# Patient Record
Sex: Female | Born: 1935 | Race: Black or African American | Hispanic: No | Smoking: Former smoker
Health system: Southern US, Community
[De-identification: ages and names within clinical notes are randomized; demographics above are authoritative.]

## PROBLEM LIST (undated history)

## (undated) DIAGNOSIS — R112 Nausea with vomiting, unspecified: Secondary | ICD-10-CM

## (undated) DIAGNOSIS — C3490 Malignant neoplasm of unspecified part of unspecified bronchus or lung: Secondary | ICD-10-CM

## (undated) DIAGNOSIS — I10 Essential (primary) hypertension: Secondary | ICD-10-CM

## (undated) DIAGNOSIS — E079 Disorder of thyroid, unspecified: Secondary | ICD-10-CM

## (undated) DIAGNOSIS — E7800 Pure hypercholesterolemia, unspecified: Secondary | ICD-10-CM

## (undated) DIAGNOSIS — T451X5A Adverse effect of antineoplastic and immunosuppressive drugs, initial encounter: Secondary | ICD-10-CM

## (undated) DIAGNOSIS — C159 Malignant neoplasm of esophagus, unspecified: Secondary | ICD-10-CM

## (undated) DIAGNOSIS — M810 Age-related osteoporosis without current pathological fracture: Secondary | ICD-10-CM

## (undated) DIAGNOSIS — B019 Varicella without complication: Secondary | ICD-10-CM

## (undated) HISTORY — DX: Nausea with vomiting, unspecified: R11.2

## (undated) HISTORY — DX: Malignant neoplasm of esophagus, unspecified: C15.9

## (undated) HISTORY — PX: ABDOMINAL HYSTERECTOMY: SHX81

## (undated) HISTORY — DX: Pure hypercholesterolemia, unspecified: E78.00

## (undated) HISTORY — DX: Essential (primary) hypertension: I10

## (undated) HISTORY — DX: Disorder of thyroid, unspecified: E07.9

## (undated) HISTORY — DX: Varicella without complication: B01.9

## (undated) HISTORY — DX: Malignant neoplasm of unspecified part of unspecified bronchus or lung: C34.90

## (undated) HISTORY — DX: Hypercalcemia: E83.52

## (undated) HISTORY — DX: Age-related osteoporosis without current pathological fracture: M81.0

## (undated) HISTORY — PX: CHOLECYSTECTOMY: SHX55

## (undated) HISTORY — DX: Adverse effect of antineoplastic and immunosuppressive drugs, initial encounter: T45.1X5A

---

## 2004-05-04 ENCOUNTER — Ambulatory Visit: Payer: Self-pay | Admitting: Internal Medicine

## 2004-07-14 ENCOUNTER — Ambulatory Visit: Payer: Self-pay | Admitting: Internal Medicine

## 2005-02-13 ENCOUNTER — Emergency Department: Payer: Self-pay | Admitting: Emergency Medicine

## 2005-04-12 ENCOUNTER — Other Ambulatory Visit: Payer: Self-pay

## 2005-04-18 ENCOUNTER — Inpatient Hospital Stay: Payer: Self-pay | Admitting: Obstetrics and Gynecology

## 2005-04-18 HISTORY — PX: OTHER SURGICAL HISTORY: SHX169

## 2005-07-18 ENCOUNTER — Ambulatory Visit: Payer: Self-pay | Admitting: Internal Medicine

## 2006-07-18 ENCOUNTER — Ambulatory Visit: Payer: Self-pay | Admitting: Internal Medicine

## 2007-01-19 ENCOUNTER — Ambulatory Visit: Payer: Self-pay | Admitting: Internal Medicine

## 2007-03-07 ENCOUNTER — Emergency Department: Payer: Self-pay | Admitting: Emergency Medicine

## 2007-03-07 ENCOUNTER — Other Ambulatory Visit: Payer: Self-pay

## 2007-07-23 ENCOUNTER — Ambulatory Visit: Payer: Self-pay | Admitting: Internal Medicine

## 2008-01-25 ENCOUNTER — Emergency Department: Payer: Self-pay | Admitting: Emergency Medicine

## 2008-07-24 ENCOUNTER — Ambulatory Visit: Payer: Self-pay | Admitting: Internal Medicine

## 2009-07-28 ENCOUNTER — Ambulatory Visit: Payer: Self-pay | Admitting: Internal Medicine

## 2009-08-04 ENCOUNTER — Ambulatory Visit: Payer: Self-pay | Admitting: Internal Medicine

## 2009-08-31 ENCOUNTER — Ambulatory Visit: Payer: Self-pay | Admitting: Surgery

## 2010-02-03 ENCOUNTER — Ambulatory Visit: Payer: Self-pay | Admitting: Surgery

## 2010-08-17 ENCOUNTER — Ambulatory Visit: Payer: Self-pay | Admitting: Internal Medicine

## 2011-07-12 ENCOUNTER — Other Ambulatory Visit: Payer: Self-pay | Admitting: Endocrinology

## 2011-07-12 DIAGNOSIS — E049 Nontoxic goiter, unspecified: Secondary | ICD-10-CM

## 2011-07-19 ENCOUNTER — Ambulatory Visit
Admission: RE | Admit: 2011-07-19 | Discharge: 2011-07-19 | Disposition: A | Discharge status: Home / Self Care | Payer: Medicare Other | Source: Ambulatory Visit | Attending: Endocrinology | Admitting: Endocrinology

## 2011-07-19 DIAGNOSIS — E049 Nontoxic goiter, unspecified: Secondary | ICD-10-CM

## 2011-10-14 ENCOUNTER — Ambulatory Visit: Payer: Self-pay | Admitting: Internal Medicine

## 2011-10-14 LAB — HM MAMMOGRAPHY

## 2012-03-15 ENCOUNTER — Telehealth: Payer: Self-pay | Admitting: Internal Medicine

## 2012-03-15 MED ORDER — PANTOPRAZOLE SODIUM 40 MG PO TBEC: 40.0000 mg | DELAYED_RELEASE_TABLET | Freq: Every day | ORAL | Status: DC

## 2012-03-15 MED ORDER — AMLODIPINE BESYLATE 10 MG PO TABS: 10.0000 mg | ORAL_TABLET | Freq: Every day | ORAL | Status: DC

## 2012-03-15 NOTE — Telephone Encounter (Signed)
Pt came in today needing refills on the follow meds. Made appointment for 1/14  [facility]  Rx# [id number] 30 pantoprazole sodium 40mg  ter  Generic for protonix 40mg  ter Take 1 table by mouth each morning  Take 30 min for breakfast  AV#[id number] 30 amlodipine besylate 10mg  tab Take 1 tablet by mouth every morning  For blood pressure  Pt had sample bottle that dr Evlyn Kanner her endocrine  20mg /12.5mg  benicar hct tablets Pt stated she take once daily Pt only has 3 tablets left of this

## 2012-03-15 NOTE — Telephone Encounter (Signed)
Ok to refill her amlodipine and her generic protonix x 4.  Regarding the med Dr Evlyn Kanner started - notify her to have him refill this until i can see her or get her information from Dr Evlyn Kanner. She will need to sign a release - so i can get her info from Dr Evlyn Kanner.  Also if needed, i can work her in tomorrow to follow up regarding her blood pressure - and can refill her med then.

## 2012-03-15 NOTE — Telephone Encounter (Signed)
RX's sent and left a message for pt to return call

## 2012-06-26 ENCOUNTER — Encounter: Payer: Self-pay | Admitting: Internal Medicine

## 2012-06-26 ENCOUNTER — Ambulatory Visit (INDEPENDENT_AMBULATORY_CARE_PROVIDER_SITE_OTHER): Payer: Medicare Other | Admitting: Internal Medicine

## 2012-06-26 VITALS — BP 122/70 | HR 76 | Temp 97.9°F | Ht 63.0 in | Wt 154.5 lb

## 2012-06-26 DIAGNOSIS — M810 Age-related osteoporosis without current pathological fracture: Secondary | ICD-10-CM

## 2012-06-26 DIAGNOSIS — E7800 Pure hypercholesterolemia, unspecified: Secondary | ICD-10-CM

## 2012-06-26 DIAGNOSIS — Z23 Encounter for immunization: Secondary | ICD-10-CM

## 2012-06-26 DIAGNOSIS — I10 Essential (primary) hypertension: Secondary | ICD-10-CM | POA: Insufficient documentation

## 2012-06-26 DIAGNOSIS — K219 Gastro-esophageal reflux disease without esophagitis: Secondary | ICD-10-CM

## 2012-06-26 MED ORDER — OLMESARTAN MEDOXOMIL-HCTZ 20-12.5 MG PO TABS: 1.0000 | ORAL_TABLET | Freq: Every day | ORAL | Status: DC

## 2012-06-26 MED ORDER — PNEUMOCOCCAL VAC POLYVALENT 25 MCG/0.5ML IJ INJ
0.5000 mL | INJECTION | Freq: Once | INTRAMUSCULAR | Status: AC
  Administered 2012-06-26: 0.5 mL via INTRAMUSCULAR

## 2012-06-26 NOTE — Progress Notes (Signed)
  Subjective:    Patient ID: Heather Cooper, female    DOB: [birth year], 79 y.o.   MRN: [id number]  HPI 79 year old female with past history of hypertension, hypercholesterolemia and osteoporosis who comes in today for a scheduled follow up.  She just recently saw Dr Evlyn Kanner.  Had her thyroid checked.  States he took blood work for her cholesterol and thyroid.  She had some issues with ankle swelling this summer.  He changed her to Benicar/HCTZ 20/12.5.  Still taking the amlodipine.  No swelling now. Stays active.  No cardiac symptoms with increased activity or exertion.  Breathing stable.    Past Medical History  Diagnosis Date  . Hypertension   . Hypercholesterolemia   . Osteoporosis   . Hypercalcemia     familial hypocalciuric hypercalcemia  . Chicken pox   . Thyroid disease     Current Outpatient Prescriptions on File Prior to Visit  Medication Sig Dispense Refill  . amLODipine (NORVASC) 10 MG tablet Take 1 tablet (10 mg total) by mouth daily.  30 tablet  4  . Calcium Carbonate (CALTRATE 600 PO) Take 1 tablet by mouth daily.      [name] olmesartan-hydrochlorothiazide (BENICAR HCT) 20-12.5 MG per tablet Take 1 tablet by mouth daily.  30 tablet  4  . pantoprazole (PROTONIX) 40 MG tablet Take 1 tablet (40 mg total) by mouth daily.  30 tablet  4  . fluticasone (FLONASE) 50 MCG/ACT nasal spray Place 2 sprays into the nose daily.        Review of Systems Patient denies any headache, lightheadedness or dizziness.  No sinus or allergy symptoms.   No chest pain, tightness or palpitations.  No increased shortness of breath, cough or congestion.  No nausea or vomiting. No acid reflux.  On protonix.  No abdominal pain or cramping.  No bowel change, such as diarrhea, constipation, BRBPR or melana.  No urine change.        Objective:   Physical Exam Filed Vitals:   06/26/12 0754  BP: 122/70  Pulse: 76  Temp: 97.9 F (26.76 C)   79 year old female in no acute distress.   HEENT:  Nares - clear.   OP- without lesions or erythema.  NECK:  Supple, nontender.  No audible bruit.   HEART:  Appears to be regular. LUNGS:  Without crackles or wheezing audible.  Respirations even and unlabored.   RADIAL PULSE:  Equal bilaterally.  ABDOMEN:  Soft, nontender.  No audible abdominal bruit.   EXTREMITIES:  No increased edema to be present.                     Assessment & Plan:  PREVIOUS LOWER EXTREMITY SWELLING.  Resolved.    ABNORMAL MAMMOGRAM.  Bilateral mammogram 2/11 - BiRADS IV.  Saw Dr Katrinka Blazing.  Recommended follow up mammogram.  Left breast mammogram 8/11 read as BiRADS III.  Had follow up with Dr Katrinka Blazing.  No biopsy recommended.  Follow up mammogram 08/17/10 - BiRADS II and 10/14/11 BiRADS II.  Schedule a follow up mammogram when due.    HEALTH MAINTENANCE.  Physical 08/31/11.  She is s/p hysterectomy.  Colonoscopy 08/29/03 - diverticulosis and a 6mm polyp.  Per record pt due follow up colonoscopy 08/2013.  Mammogram as outlined.

## 2012-07-01 ENCOUNTER — Encounter: Payer: Self-pay | Admitting: Internal Medicine

## 2012-07-01 NOTE — Assessment & Plan Note (Signed)
She is agreeable to take her calcium and vitamin D.  Has declined further treatments.  Last bone density revealed osteoporosis - interval decrease.  Follow.

## 2012-07-01 NOTE — Assessment & Plan Note (Signed)
Controlled on Protonix.  Follow.   

## 2012-07-01 NOTE — Assessment & Plan Note (Signed)
Low cholesterol diet and exercise.  Follow lipid panel.  Obtain labs from Dr Evlyn Kanner.

## 2012-07-01 NOTE — Assessment & Plan Note (Signed)
Followed by Dr Evlyn Kanner.  Most recent calcium was wnl.  Need labs drawn yesterday by Dr Leanna Battles.

## 2012-07-01 NOTE — Assessment & Plan Note (Signed)
Blood pressure is under good control.  Same medications.  Follow.   

## 2012-07-12 ENCOUNTER — Other Ambulatory Visit: Payer: Self-pay | Admitting: *Deleted

## 2012-07-13 MED ORDER — PANTOPRAZOLE SODIUM 40 MG PO TBEC: 40.0000 mg | DELAYED_RELEASE_TABLET | Freq: Every day | ORAL | Status: DC

## 2012-07-13 NOTE — Telephone Encounter (Signed)
Sent in to pharmacy.  

## 2012-10-05 ENCOUNTER — Encounter: Admitting: Internal Medicine

## 2012-10-05 ENCOUNTER — Encounter: Payer: Self-pay | Admitting: Internal Medicine

## 2012-10-05 ENCOUNTER — Ambulatory Visit (INDEPENDENT_AMBULATORY_CARE_PROVIDER_SITE_OTHER): Payer: Medicare Other | Admitting: Internal Medicine

## 2012-10-05 VITALS — BP 130/60 | HR 83 | Temp 97.6°F | Ht 63.0 in | Wt 155.5 lb

## 2012-10-05 DIAGNOSIS — Z1239 Encounter for other screening for malignant neoplasm of breast: Secondary | ICD-10-CM

## 2012-10-05 DIAGNOSIS — E7800 Pure hypercholesterolemia, unspecified: Secondary | ICD-10-CM

## 2012-10-05 DIAGNOSIS — K219 Gastro-esophageal reflux disease without esophagitis: Secondary | ICD-10-CM

## 2012-10-05 DIAGNOSIS — M810 Age-related osteoporosis without current pathological fracture: Secondary | ICD-10-CM

## 2012-10-05 DIAGNOSIS — I10 Essential (primary) hypertension: Secondary | ICD-10-CM

## 2012-10-05 MED ORDER — FLUTICASONE PROPIONATE 50 MCG/ACT NA SUSP: 2.0000 | Freq: Every day | NASAL | Status: DC

## 2012-10-05 MED ORDER — NYSTATIN 100000 UNIT/GM EX CREA: TOPICAL_CREAM | Freq: Two times a day (BID) | CUTANEOUS | Status: DC

## 2012-10-05 MED ORDER — AMLODIPINE BESYLATE 10 MG PO TABS: 10.0000 mg | ORAL_TABLET | Freq: Every day | ORAL | Status: DC

## 2012-10-07 ENCOUNTER — Encounter: Payer: Self-pay | Admitting: Internal Medicine

## 2012-10-07 NOTE — Assessment & Plan Note (Signed)
She is agreeable to take her calcium and vitamin D.  Has declined further treatments.  Last bone density revealed osteoporosis - interval decrease.  Follow.

## 2012-10-07 NOTE — Progress Notes (Signed)
Subjective:    Patient ID: Heather Cooper, female    DOB: [birth year], 79 y.o.   MRN: [id number]  HPI 79 year old female with past history of hypertension, hypercholesterolemia and osteoporosis who comes in today to follow up on these issues as well as for a complete physical exam.   She is planning to see  Dr Evlyn Kanner 10/20/12.  He is following her thyroid.  States she is doing well.  No chest pain or tightness.  Breathing stable.  No acid reflux.  Bowels stable.  Previously had some issues with swelling.  See last note for details.  No swelling now. Stays active.  No cardiac symptoms with increased activity or exertion.   Past Medical History  Diagnosis Date  . Hypertension   . Hypercholesterolemia   . Osteoporosis   . Hypercalcemia     familial hypocalciuric hypercalcemia  . Chicken pox   . Thyroid disease     Current Outpatient Prescriptions on File Prior to Visit  Medication Sig Dispense Refill  . aspirin 81 MG tablet Take 81 mg by mouth daily.      . Calcium Carbonate (CALTRATE 600 PO) Take 1 tablet by mouth daily.      . cholecalciferol (VITAMIN D) 400 UNITS TABS Take 400 Units by mouth daily.      [name] olmesartan-hydrochlorothiazide (BENICAR HCT) 20-12.5 MG per tablet Take 1 tablet by mouth daily.  30 tablet  4  . pantoprazole (PROTONIX) 40 MG tablet Take 1 tablet (40 mg total) by mouth daily.  30 tablet  5   No current facility-administered medications on file prior to visit.    Review of Systems Patient denies any headache, lightheadedness or dizziness.  No sinus or allergy symptoms.   No chest pain, tightness or palpitations.  No increased shortness of breath, cough or congestion.  No nausea or vomiting. No acid reflux.  On protonix.  No abdominal pain or cramping.  No bowel change, such as diarrhea, constipation, BRBPR or melana.  No urine change.   Overall she feels she is doing well.      Objective:   Physical Exam  Filed Vitals:   10/05/12 1351  BP: 130/60  Pulse: 83   Temp: 97.6 F (15.47 C)   79 year old female in no acute distress.   HEENT:  Nares- clear.  Oropharynx - without lesions. NECK:  Supple.  Nontender.  No audible bruit.  HEART:  Appears to be regular. LUNGS:  No crackles or wheezing audible.  Respirations even and unlabored.  RADIAL PULSE:  Equal bilaterally.    BREASTS:  No nipple discharge or nipple retraction present.  Could not appreciate any distinct nodules or axillary adenopathy.  ABDOMEN:  Soft, nontender.  Bowel sounds present and normal.  No audible abdominal bruit.  GU/RECTAL:  She declines.   EXTREMITIES:  No increased edema present.  DP pulses palpable and equal bilaterally.           Assessment & Plan:  PREVIOUS LOWER EXTREMITY SWELLING.  Resolved.    ABNORMAL MAMMOGRAM.  Bilateral mammogram 2/11 - BiRADS IV.  Saw Dr Katrinka Blazing.  Recommended follow up mammogram.  Left breast mammogram 8/11 read as BiRADS III.  Had follow up with Dr Katrinka Blazing.  No biopsy recommended.  Follow up mammogram 08/17/10 - BiRADS II and 10/14/11 BiRADS II.  Schedule a follow up mammogram.   HEALTH MAINTENANCE.  Physical today.  She is s/p hysterectomy.  Declined pelvic exam.  Colonoscopy 08/29/03 - diverticulosis and a  6mm polyp.  Per record pt due follow up colonoscopy 08/2013.  IFOB.  Mammogram as outlined.  Schedule a follow up mammogram.

## 2012-10-07 NOTE — Assessment & Plan Note (Signed)
Followed by Dr South.  Most recent calcium was wnl.    

## 2012-10-07 NOTE — Assessment & Plan Note (Signed)
Low cholesterol diet and exercise.  Follow lipid panel.   

## 2012-10-07 NOTE — Assessment & Plan Note (Signed)
Blood pressure is under good control.  Same medications.  Follow.   

## 2012-10-07 NOTE — Assessment & Plan Note (Signed)
Controlled on Protonix.  Follow.   

## 2012-11-06 ENCOUNTER — Ambulatory Visit: Payer: Self-pay | Admitting: Internal Medicine

## 2012-11-07 ENCOUNTER — Encounter: Payer: Self-pay | Admitting: Internal Medicine

## 2012-11-28 ENCOUNTER — Encounter: Payer: Self-pay | Admitting: Internal Medicine

## 2013-01-07 ENCOUNTER — Encounter: Payer: Self-pay | Admitting: Internal Medicine

## 2013-01-07 ENCOUNTER — Ambulatory Visit (INDEPENDENT_AMBULATORY_CARE_PROVIDER_SITE_OTHER): Payer: Medicare Other | Admitting: Internal Medicine

## 2013-01-07 VITALS — BP 124/60 | HR 66 | Temp 97.9°F | Ht 63.0 in | Wt 158.0 lb

## 2013-01-07 DIAGNOSIS — I10 Essential (primary) hypertension: Secondary | ICD-10-CM

## 2013-01-07 NOTE — Progress Notes (Signed)
Subjective:    Patient ID: Heather Cooper, female    DOB: 06/25/1930, 79 y.o.   MRN: [id number]  Cough  79 year old female with past history of hypertension, hypercholesterolemia and osteoporosis who comes in today as a work in for an acute care follow up.   Was diagnosed with bronchitis.  Treated with Zpak and prednisone.  Cough is better.   Feels better.  The previous left side pain has resolved.  Energy improved.  Stays active.  No cardiac symptoms with increased activity or exertion.   Past Medical History  Diagnosis Date  . Hypertension   . Hypercholesterolemia   . Osteoporosis   . Hypercalcemia     familial hypocalciuric hypercalcemia  . Chicken pox   . Thyroid disease     Current Outpatient Prescriptions on File Prior to Visit  Medication Sig Dispense Refill  . amLODipine (NORVASC) 10 MG tablet Take 1 tablet (10 mg total) by mouth daily.  30 tablet  5  . aspirin 81 MG tablet Take 81 mg by mouth daily.      . Calcium Carbonate (CALTRATE 600 PO) Take 1 tablet by mouth daily.      . cholecalciferol (VITAMIN D) 400 UNITS TABS Take 400 Units by mouth daily.      . fluticasone (FLONASE) 50 MCG/ACT nasal spray Place 2 sprays into the nose daily.  16 g  5  . nystatin cream (MYCOSTATIN) Apply topically 2 (two) times daily.  30 g  0  . olmesartan-hydrochlorothiazide (BENICAR HCT) 20-12.5 MG per tablet Take 1 tablet by mouth daily.  30 tablet  4  . pantoprazole (PROTONIX) 40 MG tablet Take 1 tablet (40 mg total) by mouth daily.  30 tablet  5   No current facility-administered medications on file prior to visit.    Review of Systems  Respiratory: Positive for cough.   Patient denies any headache, lightheadedness or dizziness.  No sinus or allergy symptoms.   No chest pain, tightness or palpitations.  The previous left side chest pain resolved.  No increased shortness of breath.  Cough is better.  No nausea or vomiting. No acid reflux.  On protonix.  No abdominal pain or cramping.  No  bowel change.  Overall doing much better.  Energy better.       Objective:   Physical Exam  Filed Vitals:   01/07/13 1354  BP: 124/60  Pulse: 66  Temp: 97.9 F (71.42 C)   79 year old female in no acute distress.   HEENT:  Nares- clear.  Oropharynx - without lesions. NECK:  Supple.  Nontender.  HEART:  Appears to be regular. LUNGS:  No crackles or wheezing audible.  Respirations even and unlabored.  Good breath sounds bilaterally.  No pain with deep inspiration.   RADIAL PULSE:  Equal bilaterally.  ABDOMEN:  Soft, nontender.  Bowel sounds present and normal.  No audible abdominal bruit.  EXTREMITIES:  No increased edema present.  DP pulses palpable and equal bilaterally.           Assessment & Plan:  BRONCHITIS.  Treated with zpak and prednisone.  Better.  Lungs clear.  Pain resolved.  Obtain records to review from [facility].  If any abnormality noted on cxr, will need f/u cxr.  PREVIOUS LOWER EXTREMITY SWELLING.  Resolved.    ABNORMAL MAMMOGRAM.  Bilateral mammogram 2/11 - BiRADS IV.  Saw Dr Katrinka Blazing.  Recommended follow up mammogram.  Left breast mammogram 8/11 read as BiRADS III.  Had follow up with Dr Katrinka Blazing.  No biopsy recommended.  Follow up mammogram 08/17/10 - BiRADS II and 10/14/11 BiRADS II.  Mammogram 11/06/12 - Birads II.     HEALTH MAINTENANCE.  Physical today.  She is s/p hysterectomy.  Declined pelvic exam.  Colonoscopy 08/29/03 - diverticulosis and a 6mm polyp.  Per record pt due follow up colonoscopy 08/2013.   Mammogram 11/06/12 - Birads II.

## 2013-01-07 NOTE — Assessment & Plan Note (Signed)
Blood pressure on recheck:  142/78.  Follow.  Same medication regimen.

## 2013-01-14 ENCOUNTER — Other Ambulatory Visit: Payer: Self-pay | Admitting: *Deleted

## 2013-01-14 MED ORDER — OLMESARTAN MEDOXOMIL-HCTZ 20-12.5 MG PO TABS: 1.0000 | ORAL_TABLET | Freq: Every day | ORAL | Status: DC

## 2013-01-18 ENCOUNTER — Other Ambulatory Visit: Payer: Self-pay | Admitting: *Deleted

## 2013-01-18 MED ORDER — PANTOPRAZOLE SODIUM 40 MG PO TBEC: 40.0000 mg | DELAYED_RELEASE_TABLET | Freq: Every day | ORAL | Status: DC

## 2013-01-31 ENCOUNTER — Other Ambulatory Visit (INDEPENDENT_AMBULATORY_CARE_PROVIDER_SITE_OTHER): Payer: Medicare Other

## 2013-01-31 DIAGNOSIS — E7800 Pure hypercholesterolemia, unspecified: Secondary | ICD-10-CM

## 2013-01-31 DIAGNOSIS — I10 Essential (primary) hypertension: Secondary | ICD-10-CM

## 2013-01-31 LAB — COMPREHENSIVE METABOLIC PANEL
ALT: 18 U/L (ref 0–35)
AST: 19 U/L (ref 0–37)
Alkaline Phosphatase: 84 U/L (ref 39–117)
CO2: 26 mEq/L (ref 19–32)
Chloride: 102 mEq/L (ref 96–112)
Creatinine, Ser: 0.8 mg/dL (ref 0.4–1.2)
GFR: 83.88 mL/min (ref >60.00)
Potassium: 4.1 mEq/L (ref 3.5–5.1)
Total Bilirubin: 0.6 mg/dL (ref 0.3–1.2)
Total Protein: 7.8 g/dL (ref 6.0–8.3)

## 2013-01-31 LAB — CBC WITH DIFFERENTIAL/PLATELET
Basophils Relative: 0.4 % (ref 0.0–3.0)
Eosinophils Relative: 4.2 % (ref 0.0–5.0)
Lymphocytes Relative: 23.2 % (ref 12.0–46.0)
MCV: 84.1 fl (ref 78.0–100.0)
Neutrophils Relative %: 64.1 % (ref 43.0–77.0)
RBC: 4.41 Mil/uL (ref 3.87–5.11)
WBC: 7.3 10*3/uL (ref 4.5–10.5)

## 2013-01-31 LAB — LIPID PANEL: Total CHOL/HDL Ratio: 4

## 2013-02-04 ENCOUNTER — Ambulatory Visit (INDEPENDENT_AMBULATORY_CARE_PROVIDER_SITE_OTHER): Payer: Medicare Other | Admitting: Internal Medicine

## 2013-02-04 ENCOUNTER — Ambulatory Visit: Payer: Medicare Other | Admitting: Internal Medicine

## 2013-02-04 ENCOUNTER — Encounter: Payer: Self-pay | Admitting: Internal Medicine

## 2013-02-04 ENCOUNTER — Ambulatory Visit: Payer: Self-pay | Admitting: Internal Medicine

## 2013-02-04 VITALS — BP 122/60 | HR 66 | Temp 97.8°F | Ht 63.0 in | Wt 157.5 lb

## 2013-02-04 DIAGNOSIS — R739 Hyperglycemia, unspecified: Secondary | ICD-10-CM

## 2013-02-04 DIAGNOSIS — M810 Age-related osteoporosis without current pathological fracture: Secondary | ICD-10-CM

## 2013-02-04 DIAGNOSIS — E7800 Pure hypercholesterolemia, unspecified: Secondary | ICD-10-CM

## 2013-02-04 DIAGNOSIS — R7309 Other abnormal glucose: Secondary | ICD-10-CM

## 2013-02-04 DIAGNOSIS — I10 Essential (primary) hypertension: Secondary | ICD-10-CM

## 2013-02-04 DIAGNOSIS — K219 Gastro-esophageal reflux disease without esophagitis: Secondary | ICD-10-CM

## 2013-02-07 ENCOUNTER — Encounter: Payer: Self-pay | Admitting: Internal Medicine

## 2013-02-07 DIAGNOSIS — R739 Hyperglycemia, unspecified: Secondary | ICD-10-CM | POA: Insufficient documentation

## 2013-02-07 NOTE — Assessment & Plan Note (Signed)
Low cholesterol diet and exercise.  Follow lipid panel.   

## 2013-02-07 NOTE — Assessment & Plan Note (Signed)
Blood pressure is under good control.  Same medications.  Follow.   

## 2013-02-07 NOTE — Assessment & Plan Note (Signed)
Followed by Dr South.  Most recent calcium was wnl.    

## 2013-02-07 NOTE — Assessment & Plan Note (Signed)
Low carb diet.  Recheck sugar and a1c.

## 2013-02-07 NOTE — Assessment & Plan Note (Signed)
Controlled on Protonix.  Follow.   

## 2013-02-07 NOTE — Progress Notes (Signed)
Subjective:    Patient ID: Heather Cooper, female    DOB: August 17, 1930, 79 y.o.   MRN: [id number]  HPI 79 year old female with past history of hypertension, hypercholesterolemia and osteoporosis who comes in today for a scheduled follow up.  Still seeing Dr Evlyn Kanner for her thyroid.  States she is doing well.  No chest pain or tightness.  Breathing stable.  No acid reflux.  Bowels stable.  . Stays active.  No cardiac symptoms with increased activity or exertion.  The previous cough and congestion have resolved.     Past Medical History  Diagnosis Date  . Hypertension   . Hypercholesterolemia   . Osteoporosis   . Hypercalcemia     familial hypocalciuric hypercalcemia  . Chicken pox   . Thyroid disease     Current Outpatient Prescriptions on File Prior to Visit  Medication Sig Dispense Refill  . amLODipine (NORVASC) 10 MG tablet Take 1 tablet (10 mg total) by mouth daily.  30 tablet  5  . aspirin 81 MG tablet Take 81 mg by mouth daily.      . Calcium Carbonate (CALTRATE 600 PO) Take 1 tablet by mouth daily.      . cholecalciferol (VITAMIN D) 400 UNITS TABS Take 400 Units by mouth daily.      . fluticasone (FLONASE) 50 MCG/ACT nasal spray Place 2 sprays into the nose daily.  16 g  5  . nystatin cream (MYCOSTATIN) Apply topically 2 (two) times daily.  30 g  0  . olmesartan-hydrochlorothiazide (BENICAR HCT) 20-12.5 MG per tablet Take 1 tablet by mouth daily.  30 tablet  5  . pantoprazole (PROTONIX) 40 MG tablet Take 1 tablet (40 mg total) by mouth daily.  30 tablet  5   No current facility-administered medications on file prior to visit.    Review of Systems Patient denies any headache, lightheadedness or dizziness.  No sinus or allergy symptoms.   No chest pain, tightness or palpitations.  No increased shortness of breath, cough or congestion.  No nausea or vomiting. No acid reflux.  On protonix.  No abdominal pain or cramping.  No bowel change, such as diarrhea, constipation, BRBPR or  melana.  No urine change.   Overall she feels she is doing well.      Objective:   Physical Exam  Filed Vitals:   02/04/13 0925  BP: 122/60  Pulse: 66  Temp: 97.8 F (74.56 C)   79 year old female in no acute distress.   HEENT:  Nares- clear.  Oropharynx - without lesions. NECK:  Supple.  Nontender.  No audible bruit.  HEART:  Appears to be regular. LUNGS:  No crackles or wheezing audible.  Respirations even and unlabored.  RADIAL PULSE:  Equal bilaterally.   ABDOMEN:  Soft, nontender.  Bowel sounds present and normal.  No audible abdominal bruit.  EXTREMITIES:  No increased edema present.  DP pulses palpable and equal bilaterally.           Assessment & Plan:  PREVIOUS PNEUMONIA.  Treated.  Abnormal cxr.  No symptoms now.  Will recheck cxr to confirm complete clearance.    PREVIOUS LOWER EXTREMITY SWELLING.  Resolved.    ABNORMAL MAMMOGRAM.  Bilateral mammogram 2/11 - BiRADS IV.  Saw Dr Katrinka Blazing.  Recommended follow up mammogram.  Left breast mammogram 8/11 read as BiRADS III.  Had follow up with Dr Katrinka Blazing.  No biopsy recommended.  Follow up mammogram 08/17/10 - BiRADS II,  10/14/11 BiRADS II  and 11/06/12 - Birads II.   HEALTH MAINTENANCE.  Physical 10/05/12.  She is s/p hysterectomy.   Colonoscopy 08/29/03 - diverticulosis and a 6mm polyp.  Per record pt due follow up colonoscopy 08/2013.   Mammogram as outlined.

## 2013-02-07 NOTE — Assessment & Plan Note (Signed)
She is agreeable to take her calcium and vitamin D.  Has declined further treatments.  Last bone density revealed osteoporosis - interval decrease.  Follow.

## 2013-02-27 ENCOUNTER — Encounter: Payer: Self-pay | Admitting: Internal Medicine

## 2013-03-01 ENCOUNTER — Other Ambulatory Visit (INDEPENDENT_AMBULATORY_CARE_PROVIDER_SITE_OTHER): Payer: Medicare Other

## 2013-03-01 DIAGNOSIS — R7309 Other abnormal glucose: Secondary | ICD-10-CM

## 2013-03-01 DIAGNOSIS — R739 Hyperglycemia, unspecified: Secondary | ICD-10-CM

## 2013-03-01 LAB — HEMOGLOBIN A1C: Hgb A1c MFr Bld: 6.4 % (ref 4.6–6.5)

## 2013-03-02 LAB — GLUCOSE, FASTING: Glucose, Fasting: 115 mg/dL — ABNORMAL HIGH (ref 70–99)

## 2013-06-03 ENCOUNTER — Ambulatory Visit (INDEPENDENT_AMBULATORY_CARE_PROVIDER_SITE_OTHER): Payer: Medicare Other | Admitting: Internal Medicine

## 2013-06-03 ENCOUNTER — Encounter: Payer: Self-pay | Admitting: Internal Medicine

## 2013-06-03 ENCOUNTER — Other Ambulatory Visit: Payer: Self-pay | Admitting: Internal Medicine

## 2013-06-03 ENCOUNTER — Encounter (INDEPENDENT_AMBULATORY_CARE_PROVIDER_SITE_OTHER): Payer: Self-pay

## 2013-06-03 VITALS — BP 122/60 | HR 63 | Temp 97.6°F | Ht 63.0 in | Wt 154.2 lb

## 2013-06-03 DIAGNOSIS — M810 Age-related osteoporosis without current pathological fracture: Secondary | ICD-10-CM

## 2013-06-03 DIAGNOSIS — I10 Essential (primary) hypertension: Secondary | ICD-10-CM

## 2013-06-03 DIAGNOSIS — E7800 Pure hypercholesterolemia, unspecified: Secondary | ICD-10-CM

## 2013-06-03 DIAGNOSIS — R739 Hyperglycemia, unspecified: Secondary | ICD-10-CM

## 2013-06-03 DIAGNOSIS — R7309 Other abnormal glucose: Secondary | ICD-10-CM

## 2013-06-03 DIAGNOSIS — K219 Gastro-esophageal reflux disease without esophagitis: Secondary | ICD-10-CM

## 2013-06-03 LAB — COMPREHENSIVE METABOLIC PANEL
ALT: 16 U/L (ref 0–35)
AST: 25 U/L (ref 0–37)
Albumin: 4.1 g/dL (ref 3.5–5.2)
Alkaline Phosphatase: 85 U/L (ref 39–117)
BUN: 16 mg/dL (ref 6–23)
Creatinine, Ser: 0.8 mg/dL (ref 0.4–1.2)
Potassium: 4.1 mEq/L (ref 3.5–5.1)
Total Bilirubin: 0.7 mg/dL (ref 0.3–1.2)

## 2013-06-03 LAB — LIPID PANEL
Cholesterol: 166 mg/dL (ref 0–200)
HDL: 40.9 mg/dL (ref >39.00)
LDL Cholesterol: 109 mg/dL — ABNORMAL HIGH (ref 0–99)
Total CHOL/HDL Ratio: 4
VLDL: 16 mg/dL (ref 0.0–40.0)

## 2013-06-03 NOTE — Progress Notes (Signed)
Orders placed for f/u labs.  

## 2013-06-03 NOTE — Progress Notes (Signed)
Pre-visit discussion using our clinic review tool. No additional management support is needed unless otherwise documented below in the visit note.  

## 2013-06-03 NOTE — Progress Notes (Signed)
Subjective:    Patient ID: Heather Cooper, female    DOB: 16-Mar-1931, 79 y.o.   MRN: [id number]  HPI 79 year old female with past history of hypertension, hypercholesterolemia and osteoporosis who comes in today for a scheduled follow up.  Still seeing Dr Evlyn Kanner for her thyroid.  States she is doing well.  No chest pain or tightness.  Breathing stable.  No acid reflux.  Bowels stable.  . Stays active.  No cardiac symptoms with increased activity or exertion.  Trying to watch her diet.       Past Medical History  Diagnosis Date  . Hypertension   . Hypercholesterolemia   . Osteoporosis   . Hypercalcemia     familial hypocalciuric hypercalcemia  . Chicken pox   . Thyroid disease     Current Outpatient Prescriptions on File Prior to Visit  Medication Sig Dispense Refill  . amLODipine (NORVASC) 10 MG tablet Take 1 tablet (10 mg total) by mouth daily.  30 tablet  5  . aspirin 81 MG tablet Take 81 mg by mouth daily.      . Calcium Carbonate (CALTRATE 600 PO) Take 1 tablet by mouth daily.      . cholecalciferol (VITAMIN D) 400 UNITS TABS Take 400 Units by mouth daily.      . fluticasone (FLONASE) 50 MCG/ACT nasal spray Place 2 sprays into the nose daily.  16 g  5  . nystatin cream (MYCOSTATIN) Apply topically 2 (two) times daily.  30 g  0  . olmesartan-hydrochlorothiazide (BENICAR HCT) 20-12.5 MG per tablet Take 1 tablet by mouth daily.  30 tablet  5  . pantoprazole (PROTONIX) 40 MG tablet Take 1 tablet (40 mg total) by mouth daily.  30 tablet  5   No current facility-administered medications on file prior to visit.    Review of Systems Patient denies any headache, lightheadedness or dizziness.  No sinus or allergy symptoms.   No chest pain, tightness or palpitations.  No increased shortness of breath, cough or congestion.  No nausea or vomiting. No acid reflux.  On protonix.No abdominal pain or cramping.  No bowel change, such as diarrhea, constipation, BRBPR or melana.  No urine change.    Overall she feels she is doing well.  Has adjusted her diet.       Objective:   Physical Exam  Filed Vitals:   06/03/13 0949  BP: 122/60  Pulse: 63  Temp: 97.6 F (36.4 C)   Blood pressure recheck:  138/72, pulse 80  79 year old female in no acute distress.   HEENT:  Nares- clear.  Oropharynx - without lesions. NECK:  Supple.  Nontender.  No audible bruit.  HEART:  Appears to be regular. LUNGS:  No crackles or wheezing audible.  Respirations even and unlabored.  RADIAL PULSE:  Equal bilaterally.   ABDOMEN:  Soft, nontender.  Bowel sounds present and normal.  No audible abdominal bruit.  EXTREMITIES:  No increased edema present.  DP pulses palpable and equal bilaterally.  FEET:  Without lesions.            Assessment & Plan:  PREVIOUS PNEUMONIA.  Follow up cxr 02/04/13 negative.      PREVIOUS LOWER EXTREMITY SWELLING.  Resolved.    ABNORMAL MAMMOGRAM.  Bilateral mammogram 2/11 - BiRADS IV.  Saw Dr Katrinka Blazing.  Recommended follow up mammogram.  Left breast mammogram 8/11 read as BiRADS III.  Had follow up with Dr Katrinka Blazing.  No biopsy recommended.  Follow up  mammogram 08/17/10 - BiRADS II,  10/14/11 BiRADS II and 11/06/12 - Birads II.   HEALTH MAINTENANCE.  Physical 10/05/12.  She is s/p hysterectomy.   Colonoscopy 08/29/03 - diverticulosis and a 6mm polyp.  Per record pt due follow up colonoscopy 08/2013.   Mammogram as outlined.

## 2013-06-04 ENCOUNTER — Encounter: Payer: Self-pay | Admitting: *Deleted

## 2013-06-04 LAB — VITAMIN D 25 HYDROXY (VIT D DEFICIENCY, FRACTURES): Vit D, 25-Hydroxy: 31 ng/mL (ref 30–89)

## 2013-06-07 ENCOUNTER — Encounter: Payer: Self-pay | Admitting: Internal Medicine

## 2013-06-07 NOTE — Assessment & Plan Note (Signed)
Low carb diet.  She has adjusted her diet.  Follow met b and a1c.  Keep up to date with eye checks.    

## 2013-06-07 NOTE — Assessment & Plan Note (Signed)
Blood pressure is under good control.  Same medications.  Follow.   

## 2013-06-07 NOTE — Assessment & Plan Note (Signed)
Followed by Dr South.  Most recent calcium was wnl.    

## 2013-06-07 NOTE — Assessment & Plan Note (Signed)
Low cholesterol diet and exercise.  Follow lipid panel.   

## 2013-06-07 NOTE — Assessment & Plan Note (Addendum)
She is agreeable to take vitamin D.  Has declined further treatments.  Last bone density revealed osteoporosis - interval decrease.  Follow.   

## 2013-06-07 NOTE — Assessment & Plan Note (Signed)
Controlled on Protonix.  Follow.   

## 2013-08-29 ENCOUNTER — Other Ambulatory Visit: Payer: Self-pay | Admitting: *Deleted

## 2013-08-29 MED ORDER — AMLODIPINE BESYLATE 10 MG PO TABS: 10.0000 mg | ORAL_TABLET | Freq: Every day | ORAL | Status: DC

## 2013-09-04 ENCOUNTER — Other Ambulatory Visit: Payer: Self-pay | Admitting: *Deleted

## 2013-09-04 MED ORDER — PANTOPRAZOLE SODIUM 40 MG PO TBEC: 40.0000 mg | DELAYED_RELEASE_TABLET | Freq: Every day | ORAL | Status: DC

## 2013-10-11 ENCOUNTER — Encounter (INDEPENDENT_AMBULATORY_CARE_PROVIDER_SITE_OTHER): Payer: Self-pay

## 2013-10-11 ENCOUNTER — Encounter: Payer: Self-pay | Admitting: Internal Medicine

## 2013-10-11 ENCOUNTER — Ambulatory Visit (INDEPENDENT_AMBULATORY_CARE_PROVIDER_SITE_OTHER): Payer: Medicare Other | Admitting: Internal Medicine

## 2013-10-11 VITALS — BP 130/60 | HR 69 | Temp 98.1°F | Ht 64.0 in | Wt 155.0 lb

## 2013-10-11 DIAGNOSIS — R739 Hyperglycemia, unspecified: Secondary | ICD-10-CM

## 2013-10-11 DIAGNOSIS — I10 Essential (primary) hypertension: Secondary | ICD-10-CM

## 2013-10-11 DIAGNOSIS — M810 Age-related osteoporosis without current pathological fracture: Secondary | ICD-10-CM

## 2013-10-11 DIAGNOSIS — K219 Gastro-esophageal reflux disease without esophagitis: Secondary | ICD-10-CM

## 2013-10-11 DIAGNOSIS — Z1239 Encounter for other screening for malignant neoplasm of breast: Secondary | ICD-10-CM

## 2013-10-11 DIAGNOSIS — R7309 Other abnormal glucose: Secondary | ICD-10-CM

## 2013-10-11 DIAGNOSIS — E7800 Pure hypercholesterolemia, unspecified: Secondary | ICD-10-CM

## 2013-10-11 LAB — COMPREHENSIVE METABOLIC PANEL
ALT: 15 U/L (ref 0–35)
AST: 18 U/L (ref 0–37)
Albumin: 4.1 g/dL (ref 3.5–5.2)
Alkaline Phosphatase: 93 U/L (ref 39–117)
BILIRUBIN TOTAL: 0.4 mg/dL (ref 0.3–1.2)
BUN: 12 mg/dL (ref 6–23)
CO2: 27 meq/L (ref 19–32)
CREATININE: 0.7 mg/dL (ref 0.4–1.2)
Calcium: 9.6 mg/dL (ref 8.4–10.5)
Chloride: 103 mEq/L (ref 96–112)
GFR: 100.04 mL/min (ref >60.00)
Glucose, Bld: 97 mg/dL (ref 70–99)
Potassium: 4.2 mEq/L (ref 3.5–5.1)
SODIUM: 137 meq/L (ref 135–145)
Total Protein: 7.7 g/dL (ref 6.0–8.3)

## 2013-10-11 LAB — LIPID PANEL
Cholesterol: 153 mg/dL (ref 0–200)
HDL: 35.7 mg/dL — AB (ref >39.00)
LDL CALC: 100 mg/dL — AB (ref 0–99)
TRIGLYCERIDES: 87 mg/dL (ref 0.0–149.0)
Total CHOL/HDL Ratio: 4
VLDL: 17.4 mg/dL (ref 0.0–40.0)

## 2013-10-11 LAB — HEMOGLOBIN A1C: Hgb A1c MFr Bld: 6 % (ref 4.6–6.5)

## 2013-10-11 MED ORDER — AMLODIPINE BESYLATE 10 MG PO TABS: 10.0000 mg | ORAL_TABLET | Freq: Every day | ORAL | Status: DC

## 2013-10-11 MED ORDER — NYSTATIN 100000 UNIT/GM EX CREA: TOPICAL_CREAM | Freq: Two times a day (BID) | CUTANEOUS | Status: DC

## 2013-10-11 MED ORDER — PANTOPRAZOLE SODIUM 40 MG PO TBEC: 40.0000 mg | DELAYED_RELEASE_TABLET | Freq: Every day | ORAL | Status: DC

## 2013-10-11 MED ORDER — FLUTICASONE PROPIONATE 50 MCG/ACT NA SUSP: 2.0000 | Freq: Every day | NASAL | Status: DC

## 2013-10-11 NOTE — Progress Notes (Signed)
Pre visit review using our clinic review tool, if applicable. No additional management support is needed unless otherwise documented below in the visit note. 

## 2013-10-11 NOTE — Progress Notes (Signed)
Subjective:    Patient ID: Heather Cooper, female    DOB: 01-Feb-1931, 80 y.o.   MRN: [id number]  HPI 80 year old female with past history of hypertension, hypercholesterolemia and osteoporosis who comes in today to follow up on these issues as well as for a complete physical exam.   Still seeing Dr Forde Dandy for her thyroid.  States she is doing well.  No chest pain or tightness.  Breathing stable.  No acid reflux.  Bowels stable.  . Stays active.  No cardiac symptoms with increased activity or exertion.  Trying to watch her diet.       Past Medical History  Diagnosis Date  . Hypertension   . Hypercholesterolemia   . Osteoporosis   . Hypercalcemia     familial hypocalciuric hypercalcemia  . Chicken pox   . Thyroid disease     Current Outpatient Prescriptions on File Prior to Visit  Medication Sig Dispense Refill  . aspirin 81 MG tablet Take 81 mg by mouth daily.      . Calcium Carbonate (CALTRATE 600 PO) Take 1 tablet by mouth daily.      . cholecalciferol (VITAMIN D) 400 UNITS TABS Take 400 Units by mouth daily.      [name] olmesartan-hydrochlorothiazide (BENICAR HCT) 20-12.5 MG per tablet Take 1 tablet by mouth daily.  30 tablet  5   No current facility-administered medications on file prior to visit.    Review of Systems Patient denies any headache, lightheadedness or dizziness.  No sinus or allergy symptoms.   No chest pain, tightness or palpitations.  No increased shortness of breath, cough or congestion.  No nausea or vomiting. No acid reflux.  On protonix. No abdominal pain or cramping.  No bowel change, such as diarrhea, constipation, BRBPR or melana.  No urine change.   Overall she feels she is doing well.  Has adjusted her diet.       Objective:   Physical Exam  Filed Vitals:   10/11/13 1051  BP: 130/60  Pulse: 69  Temp: 98.1 F (36.7 C)   Blood pressure recheck:  58-35/29  80 year old female in no acute distress.   HEENT:  Nares- clear.  Oropharynx - without  lesions. NECK:  Supple.  Nontender.  No audible bruit.  HEART:  Appears to be regular. LUNGS:  No crackles or wheezing audible.  Respirations even and unlabored.  RADIAL PULSE:  Equal bilaterally.    BREASTS:  No nipple discharge or nipple retraction present.  Could not appreciate any distinct nodules or axillary adenopathy.  ABDOMEN:  Soft, nontender.  Bowel sounds present and normal.  No audible abdominal bruit.  GU:  Not performed.     EXTREMITIES:  No increased edema present.  DP pulses palpable and equal bilaterally.      FEET:  No lesions.       Assessment & Plan:  PREVIOUS PNEUMONIA.  Follow up cxr 02/04/13 negative.      PREVIOUS LOWER EXTREMITY SWELLING.  Resolved.    ABNORMAL MAMMOGRAM.  Bilateral mammogram 2/11 - BiRADS IV.  Saw Dr Tamala Julian.  Recommended follow up mammogram.  Left breast mammogram 8/11 read as BiRADS III.  Had follow up with Dr Tamala Julian.  No biopsy recommended.  Follow up mammogram 08/17/10 - BiRADS II,  10/14/11 BiRADS II and 11/06/12 - Birads II.  Schedule f/u mammogram when due.    HEALTH MAINTENANCE.  Physical today.  She is s/p hysterectomy.   Colonoscopy 08/29/03 - diverticulosis and a  63mm polyp.  Per record pt due follow up colonoscopy 08/2013.   She declines f/u colonoscopy and IFOB.  Mammogram as outlined.

## 2013-10-13 ENCOUNTER — Encounter: Payer: Self-pay | Admitting: Internal Medicine

## 2013-10-13 NOTE — Assessment & Plan Note (Signed)
Low carb diet.  She has adjusted her diet.  Follow met b and a1c.  Keep up to date with eye checks.

## 2013-10-13 NOTE — Assessment & Plan Note (Signed)
Controlled on Protonix.  Follow.

## 2013-10-13 NOTE — Assessment & Plan Note (Signed)
Low cholesterol diet and exercise.  Follow lipid panel.   

## 2013-10-13 NOTE — Assessment & Plan Note (Signed)
She is agreeable to take vitamin D.  Has declined further treatments.  Last bone density revealed osteoporosis - interval decrease.  Follow.

## 2013-10-13 NOTE — Assessment & Plan Note (Signed)
Followed by Dr Forde Dandy.  Most recent calcium was wnl.

## 2013-10-13 NOTE — Assessment & Plan Note (Signed)
Blood pressure is under good control.  Same medications.  Follow.

## 2013-10-14 ENCOUNTER — Encounter: Payer: Self-pay | Admitting: *Deleted

## 2013-10-22 ENCOUNTER — Encounter: Payer: Self-pay | Admitting: *Deleted

## 2013-11-18 ENCOUNTER — Ambulatory Visit: Payer: Self-pay | Admitting: Internal Medicine

## 2013-11-18 LAB — HM MAMMOGRAPHY: HM Mammogram: NEGATIVE

## 2013-11-19 ENCOUNTER — Encounter: Payer: Self-pay | Admitting: Internal Medicine

## 2013-12-17 ENCOUNTER — Other Ambulatory Visit: Payer: Self-pay | Admitting: *Deleted

## 2013-12-17 MED ORDER — OLMESARTAN MEDOXOMIL-HCTZ 20-12.5 MG PO TABS: 1.0000 | ORAL_TABLET | Freq: Every day | ORAL | Status: DC

## 2014-02-24 ENCOUNTER — Ambulatory Visit (INDEPENDENT_AMBULATORY_CARE_PROVIDER_SITE_OTHER)
Admission: RE | Admit: 2014-02-24 | Discharge: 2014-02-24 | Disposition: A | Discharge status: Home / Self Care | Payer: Medicare Other | Source: Ambulatory Visit | Attending: Internal Medicine | Admitting: Internal Medicine

## 2014-02-24 ENCOUNTER — Ambulatory Visit (INDEPENDENT_AMBULATORY_CARE_PROVIDER_SITE_OTHER): Payer: Medicare Other | Admitting: Internal Medicine

## 2014-02-24 ENCOUNTER — Encounter: Payer: Self-pay | Admitting: Internal Medicine

## 2014-02-24 ENCOUNTER — Telehealth: Payer: Self-pay | Admitting: Internal Medicine

## 2014-02-24 VITALS — BP 112/56 | HR 80 | Temp 98.2°F | Wt 154.0 lb

## 2014-02-24 DIAGNOSIS — R059 Cough, unspecified: Secondary | ICD-10-CM

## 2014-02-24 DIAGNOSIS — R05 Cough: Secondary | ICD-10-CM

## 2014-02-24 NOTE — Progress Notes (Signed)
HPI  Pt presents to the clinic today with c/o cough and chest congestion. This started 3 weeks ago. It started out as a dry cough but has gotten more productive as time has gone on. The cough seems to be worse at night. She denies fever, chills or body aches. She has tried Delsym with some relief. She has no history of allergies or breathing problems. She has had sick contacts-she has been staying with her husband in the hospital. She reports she has had pneumonia before and this feels the same. Last time, she did not have a fever or productive cough.  Review of Systems      Past Medical History  Diagnosis Date  . Hypertension   . Hypercholesterolemia   . Osteoporosis   . Hypercalcemia     familial hypocalciuric hypercalcemia  . Chicken pox   . Thyroid disease     Family History  Problem Relation Age of Onset  . Stroke Mother   . Hypertension Mother   . Prostate cancer Father   . Cancer Father     prostate  . Heart disease Brother     s/p CABG  . Colon cancer Neg Hx     History   Social History  . Marital Status: Married    Spouse Name: N/A    Number of Children: 3  . Years of Education: N/A   Occupational History  . Not on file.   Social History Main Topics  . Smoking status: Former Smoker    Quit date: 06/13/1997  . Smokeless tobacco: Never Used  . Alcohol Use: No  . Drug Use: No  . Sexual Activity: Not on file   Other Topics Concern  . Not on file   Social History Narrative  . No narrative on file    No Known Allergies   Constitutional:  Denies headache, fatigue, fever or abrupt weight changes.  HEENT:  Positive sore throat. Denies eye redness, eye pain, pressure behind the eyes, facial pain, nasal congestion, ear pain, ringing in the ears, wax buildup, runny nose or bloody nose. Respiratory: Positive cough. Denies difficulty breathing or shortness of breath.  Cardiovascular: Denies chest pain, chest tightness, palpitations or swelling in the hands  or feet.   No other specific complaints in a complete review of systems (except as listed in HPI above).  Objective:   BP 112/56  Pulse 80  Temp(Src) 98.2 F (36.8 C) (Oral)  Wt 154 lb (69.854 kg)  SpO2 98% Wt Readings from Last 3 Encounters:  02/24/14 154 lb (69.854 kg)  10/11/13 155 lb (70.308 kg)  06/03/13 154 lb 4 oz (69.967 kg)     General: Appears her stated age, well developed, well nourished in NAD. HEENT:  Ears: Tm's gray and intact, normal light reflex; Nose: mucosa pink and moist, septum midline; Throat/Mouth: Teeth present, mucosa erythematous and moist, no exudate noted, no lesions or ulcerations noted.   Cardiovascular: Normal rate and rhythm. S1,S2 noted.  No murmur, rubs or gallops noted.  Pulmonary/Chest: Normal effort and positive vesicular breath sounds. No respiratory distress. No wheezes, rales or ronchi noted.      Assessment & Plan:  Cough:  She would like a chest xray to r/o recurrent pneumonia Chest xray ordered Do salt water gargles for the sore throat Continue Delsym, start Mucinex If pneumonia seen on xray- will start abx  RTC as needed or if symptoms persist.

## 2014-02-24 NOTE — Progress Notes (Signed)
Subjective:    Patient ID: Heather Cooper, female    DOB: 12-Nov-1930, 80 y.o.   MRN: [id number]  HPI Patient presents with dry cough for the past three weeks.  She reports that her husband had been hospitalized for one week early in her illness and her cough seemed to get worse while she stayed with him in the hospital.  She denies any other upper respiratory symptoms.  She denies seasonal allergies.  She reports she had pneumonia a year ago that began with a dry cough like this.    Review of Systems  Past Medical History  Diagnosis Date  . Hypertension   . Hypercholesterolemia   . Osteoporosis   . Hypercalcemia     familial hypocalciuric hypercalcemia  . Chicken pox   . Thyroid disease     Current Outpatient Prescriptions  Medication Sig Dispense Refill  . amLODipine (NORVASC) 10 MG tablet Take 1 tablet (10 mg total) by mouth daily.  30 tablet  5  . aspirin 81 MG tablet Take 81 mg by mouth daily.      . Calcium Carbonate (CALTRATE 600 PO) Take 1 tablet by mouth daily.      . cholecalciferol (VITAMIN D) 400 UNITS TABS Take 400 Units by mouth daily.      . fluticasone (FLONASE) 50 MCG/ACT nasal spray Place 2 sprays into both nostrils daily.  16 g  5  . nystatin cream (MYCOSTATIN) Apply topically 2 (two) times daily.  30 g  0  . olmesartan-hydrochlorothiazide (BENICAR HCT) 20-12.5 MG per tablet Take 1 tablet by mouth daily.  30 tablet  5  . pantoprazole (PROTONIX) 40 MG tablet Take 1 tablet (40 mg total) by mouth daily.  30 tablet  5   No current facility-administered medications for this visit.    No Known Allergies  Family History  Problem Relation Age of Onset  . Stroke Mother   . Hypertension Mother   . Prostate cancer Father   . Cancer Father     prostate  . Heart disease Brother     s/p CABG  . Colon cancer Neg Hx     History   Social History  . Marital Status: Married    Spouse Name: N/A    Number of Children: 3  . Years of Education: N/A   Occupational  History  . Not on file.   Social History Main Topics  . Smoking status: Former Smoker    Quit date: 06/13/1997  . Smokeless tobacco: Never Used  . Alcohol Use: No  . Drug Use: No  . Sexual Activity: Not on file   Other Topics Concern  . Not on file   Social History Narrative  . No narrative on file     Constitutional: Denies fever, malaise, fatigue, headache or abrupt weight changes.  HEENT: Denies eye pain, eye redness, ear pain, ringing in the ears, wax buildup, runny nose, nasal congestion, bloody nose, or sore throat. Respiratory: Denies difficulty breathing, shortness of breath, sputum production.   Cardiovascular: Denies chest pain, chest tightness, palpitations or swelling in the hands or feet.    No other specific complaints in a complete review of systems (except as listed in HPI above).     Objective:   Physical Exam   BP 112/56  Pulse 80  Temp(Src) 98.2 F (36.8 C) (Oral)  Wt 154 lb (69.854 kg)  SpO2 98% Wt Readings from Last 3 Encounters:  02/24/14 154 lb (69.854 kg)  10/11/13 155 lb (  70.308 kg)  06/03/13 154 lb 4 oz (69.967 kg)    General: Appears their stated age, well developed, well nourished in NAD. Exam was performed in the chair, unable to get on the exam table. HEENT:  Nose: mucosa pink and moist, septum midline; Throat/Mouth: Teeth present, mucosa pink and moist, no exudate, lesions or ulcerations noted.  Neck: Neck supple, no lymphadenopathy noted. Cardiovascular: Normal rate and rhythm. S1,S2 noted.  No murmur, rubs or gallops noted. Pulmonary/Chest: Unable to take a full deep inspiration without cough. No respiratory distress. No wheezes, rales or ronchi noted.     BMET    Component Value Date/Time   NA 137 10/11/2013 1119   K 4.2 10/11/2013 1119   CL 103 10/11/2013 1119   CO2 27 10/11/2013 1119   GLUCOSE 97 10/11/2013 1119   BUN 12 10/11/2013 1119   CREATININE 0.7 10/11/2013 1119   CALCIUM 9.6 10/11/2013 1119    Lipid Panel     Component  Value Date/Time   CHOL 153 10/11/2013 1119   TRIG 87.0 10/11/2013 1119   HDL 35.70* 10/11/2013 1119   CHOLHDL 4 10/11/2013 1119   VLDL 17.4 10/11/2013 1119   LDLCALC 100* 10/11/2013 1119    CBC    Component Value Date/Time   WBC 7.3 01/31/2013 0827   RBC 4.41 01/31/2013 0827   HGB 12.5 01/31/2013 0827   HCT 37.0 01/31/2013 0827   PLT 265.0 01/31/2013 0827   MCV 84.1 01/31/2013 0827   MCHC 33.7 01/31/2013 0827   RDW 14.7* 01/31/2013 0827   LYMPHSABS 1.7 01/31/2013 0827   MONOABS 0.6 01/31/2013 0827   EOSABS 0.3 01/31/2013 0827   BASOSABS 0.0 01/31/2013 0827    Hgb A1C Lab Results  Component Value Date   HGBA1C 6.0 10/11/2013        Assessment & Plan:  1. Cough - DG Chest 2 View; Future  If xray comes back positive for pneumonia, then we will prescribe an abx, otherwise take Delsym for cough

## 2014-02-24 NOTE — Telephone Encounter (Signed)
Pt seen at [facility] and had xray. Pt was told that the xray showed spots and needed to make follow up appt. Please advise of date to add to schedule.msn

## 2014-02-24 NOTE — Progress Notes (Signed)
Pre visit review using our clinic review tool, if applicable. No additional management support is needed unless otherwise documented below in the visit note. 

## 2014-02-24 NOTE — Patient Instructions (Signed)
Cough, Adult  A cough is a reflex that helps clear your throat and airways. It can help heal the body or may be a reaction to an irritated airway. A cough may only last 2 or 3 weeks (acute) or may last more than 8 weeks (chronic).  CAUSES Acute cough:  Viral or bacterial infections. Chronic cough:  Infections.  Allergies.  Asthma.  Post-nasal drip.  Smoking.  Heartburn or acid reflux.  Some medicines.  Chronic lung problems (COPD).  Cancer. SYMPTOMS   Cough.  Fever.  Chest pain.  Increased breathing rate.  High-pitched whistling sound when breathing (wheezing).  Colored mucus that you cough up (sputum). TREATMENT   A bacterial cough may be treated with antibiotic medicine.  A viral cough must run its course and will not respond to antibiotics.  Your caregiver may recommend other treatments if you have a chronic cough. HOME CARE INSTRUCTIONS   Only take over-the-counter or prescription medicines for pain, discomfort, or fever as directed by your caregiver. Use cough suppressants only as directed by your caregiver.  Use a cold steam vaporizer or humidifier in your bedroom or home to help loosen secretions.  Sleep in a semi-upright position if your cough is worse at night.  Rest as needed.  Stop smoking if you smoke. SEEK IMMEDIATE MEDICAL CARE IF:   You have pus in your sputum.  Your cough starts to worsen.  You cannot control your cough with suppressants and are losing sleep.  You begin coughing up blood.  You have difficulty breathing.  You develop pain which is getting worse or is uncontrolled with medicine.  You have a fever. MAKE SURE YOU:   Understand these instructions.  Will watch your condition.  Will get help right away if you are not doing well or get worse. Document Released: 11/26/2010 Document Revised: 08/22/2011 Document Reviewed: 11/26/2010 Exit[employer] Patient Information 2015 [employer]. This information is not intended  to replace advice given to you by your health care provider. Make sure you discuss any questions you have with your health care provider.  

## 2014-02-25 NOTE — Telephone Encounter (Signed)
You have a hospital f/u @ 11:45 that wasn't put on the schedule until this morning.

## 2014-02-25 NOTE — Telephone Encounter (Signed)
Please call pt and see if she can come in today at 11:45 - 12:00 to discuss cxr and for reevaluation.  Thanks.

## 2014-02-25 NOTE — Telephone Encounter (Signed)
See if she can come in to discuss this on 03/03/14 at 8:30.  Please block 30 minute appt and block the 8:15 slot.  Thanks.

## 2014-02-26 NOTE — Telephone Encounter (Signed)
Pt notified and verbalized understanding. Appt scheduled

## 2014-03-03 ENCOUNTER — Ambulatory Visit (INDEPENDENT_AMBULATORY_CARE_PROVIDER_SITE_OTHER): Payer: Medicare Other | Admitting: Internal Medicine

## 2014-03-03 ENCOUNTER — Encounter: Payer: Self-pay | Admitting: Internal Medicine

## 2014-03-03 DIAGNOSIS — R9389 Abnormal findings on diagnostic imaging of other specified body structures: Secondary | ICD-10-CM

## 2014-03-03 DIAGNOSIS — R7309 Other abnormal glucose: Secondary | ICD-10-CM

## 2014-03-03 DIAGNOSIS — R05 Cough: Secondary | ICD-10-CM

## 2014-03-03 DIAGNOSIS — I10 Essential (primary) hypertension: Secondary | ICD-10-CM

## 2014-03-03 DIAGNOSIS — R51 Headache: Secondary | ICD-10-CM

## 2014-03-03 DIAGNOSIS — E7800 Pure hypercholesterolemia, unspecified: Secondary | ICD-10-CM

## 2014-03-03 DIAGNOSIS — K219 Gastro-esophageal reflux disease without esophagitis: Secondary | ICD-10-CM

## 2014-03-03 DIAGNOSIS — R739 Hyperglycemia, unspecified: Secondary | ICD-10-CM

## 2014-03-03 DIAGNOSIS — R059 Cough, unspecified: Secondary | ICD-10-CM

## 2014-03-03 LAB — CBC WITH DIFFERENTIAL/PLATELET
BASOS PCT: 0.3 % (ref 0.0–3.0)
Basophils Absolute: 0 10*3/uL (ref 0.0–0.1)
EOS PCT: 2.3 % (ref 0.0–5.0)
Eosinophils Absolute: 0.2 10*3/uL (ref 0.0–0.7)
HCT: 36.5 % (ref 36.0–46.0)
HEMOGLOBIN: 12.1 g/dL (ref 12.0–15.0)
LYMPHS PCT: 19 % (ref 12.0–46.0)
Lymphs Abs: 1.7 10*3/uL (ref 0.7–4.0)
MCHC: 33.3 g/dL (ref 30.0–36.0)
MCV: 84.2 fl (ref 78.0–100.0)
Monocytes Absolute: 0.6 10*3/uL (ref 0.1–1.0)
Monocytes Relative: 6.8 % (ref 3.0–12.0)
NEUTROS ABS: 6.3 10*3/uL (ref 1.4–7.7)
NEUTROS PCT: 71.6 % (ref 43.0–77.0)
Platelets: 337 10*3/uL (ref 150.0–400.0)
RBC: 4.34 Mil/uL (ref 3.87–5.11)
RDW: 13.8 % (ref 11.5–15.5)
WBC: 8.8 10*3/uL (ref 4.0–10.5)

## 2014-03-03 LAB — COMPREHENSIVE METABOLIC PANEL
ALT: 16 U/L (ref 0–35)
AST: 19 U/L (ref 0–37)
Albumin: 4 g/dL (ref 3.5–5.2)
Alkaline Phosphatase: 95 U/L (ref 39–117)
BUN: 21 mg/dL (ref 6–23)
CALCIUM: 10 mg/dL (ref 8.4–10.5)
CHLORIDE: 103 meq/L (ref 96–112)
CO2: 30 meq/L (ref 19–32)
CREATININE: 0.8 mg/dL (ref 0.4–1.2)
GFR: 95.34 mL/min (ref >60.00)
Glucose, Bld: 121 mg/dL — ABNORMAL HIGH (ref 70–99)
Potassium: 4.3 mEq/L (ref 3.5–5.1)
SODIUM: 139 meq/L (ref 135–145)
TOTAL PROTEIN: 8.4 g/dL — AB (ref 6.0–8.3)
Total Bilirubin: 0.4 mg/dL (ref 0.2–1.2)

## 2014-03-03 LAB — LIPID PANEL
Cholesterol: 155 mg/dL (ref 0–200)
HDL: 36.2 mg/dL — ABNORMAL LOW (ref >39.00)
LDL Cholesterol: 95 mg/dL (ref 0–99)
NONHDL: 118.8
Total CHOL/HDL Ratio: 4
Triglycerides: 117 mg/dL (ref 0.0–149.0)
VLDL: 23.4 mg/dL (ref 0.0–40.0)

## 2014-03-03 LAB — SEDIMENTATION RATE: Sed Rate: 51 mm/hr — ABNORMAL HIGH (ref 0–22)

## 2014-03-03 LAB — HEMOGLOBIN A1C: Hgb A1c MFr Bld: 6.3 % (ref 4.6–6.5)

## 2014-03-03 NOTE — Patient Instructions (Signed)
Use the flonase - 2 sprays each nostril one time per day.  Do this in the evening.    Saline nasal spray - flush nose at least 2-3x/day.    Robitussin DM .

## 2014-03-03 NOTE — Progress Notes (Signed)
Subjective:    Patient ID: Heather Cooper, female    DOB: 04/14/1931, 80 y.o.   MRN: [id number]  HPI 80 year old female with past history of hypertension, hypercholesterolemia and osteoporosis who comes in today as a work in to discuss an abnormal cxr and some persistent cough.   Still seeing Dr Forde Dandy for her thyroid.  Just evaluated a couple of weeks ago.  States everything checked out fine.  States she has been doing well.  Developed a cough approximately 3-4 weeks ago.  Saw Golden Hurter at [facility].  Instructed to use mucinex and delsym.  She has only been taking zyrtec.  Reports no drainage.  Right ear will ache at times.  No headache and no significant earache.  She describes the pain - more localized - TMJ region and is aggravate if she opens her mouth a certain way.  No sore throat.  No sob or wheezing.  No fever.  No nausea or vomiting.  No chest pain or tightness.  Does report some acid reflux.  She has noticed some minimal acid reflux in the evening.  Takes her protonix in the am.  Bowels stable.  . Stays active.  No cardiac symptoms with increased activity or exertion.      Past Medical History  Diagnosis Date  . Hypertension   . Hypercholesterolemia   . Osteoporosis   . Hypercalcemia     familial hypocalciuric hypercalcemia  . Chicken pox   . Thyroid disease     Current Outpatient Prescriptions on File Prior to Visit  Medication Sig Dispense Refill  . amLODipine (NORVASC) 10 MG tablet Take 1 tablet (10 mg total) by mouth daily.  30 tablet  5  . aspirin 81 MG tablet Take 81 mg by mouth daily.      . Calcium Carbonate (CALTRATE 600 PO) Take 1 tablet by mouth daily.      . cholecalciferol (VITAMIN D) 400 UNITS TABS Take 400 Units by mouth daily.      . fluticasone (FLONASE) 50 MCG/ACT nasal spray Place 2 sprays into both nostrils daily.  16 g  5  . nystatin cream (MYCOSTATIN) Apply topically 2 (two) times daily.  30 g  0  . olmesartan-hydrochlorothiazide (BENICAR HCT) 20-12.5  MG per tablet Take 1 tablet by mouth daily.  30 tablet  5  . pantoprazole (PROTONIX) 40 MG tablet Take 1 tablet (40 mg total) by mouth daily.  30 tablet  5   No current facility-administered medications on file prior to visit.    Review of Systems Patient denies any headache, lightheadedness or dizziness.  No sinus pressure.  No drainage.  No chest pain, tightness or palpitations.  No increased shortness of breath.  Does report some cough and congestion as outlined.   No nausea or vomiting. Some acid reflux as outlined.  On protonix. No abdominal pain or cramping.  No bowel change, such as diarrhea, constipation, BRBPR or melana.  No urine change.   Overall she feels she is doing well.  Has adjusted her diet.  Had cxr when seen last week.  Question of pulmonary nodule.  Recommended CT chest.       Objective:   Physical Exam  Filed Vitals:   03/03/14 0825  BP: 130/60  Pulse: 79  Temp: 98 F (36.7 C)   Blood pressure recheck:  66/3  80 year old female in no acute distress.   HEENT:  Nares- clear.  Oropharynx - without lesions.  TMs - no  erythema.  Minimal increased tenderness to palpation over the left angle of jaw.  NECK:  Supple.  Nontender.   HEART:  Appears to be regular. LUNGS:  No crackles or wheezing audible.  Respirations even and unlabored.  RADIAL PULSE:  Equal bilaterally.   ABDOMEN:  Soft, nontender.  Bowel sounds present and normal.  No audible abdominal bruit.  EXTREMITIES:  No increased edema present.  DP pulses palpable and equal bilaterally.        Assessment & Plan:  PREVIOUS LOWER EXTREMITY SWELLING.  Resolved.    ABNORMAL MAMMOGRAM.  Bilateral mammogram 2/11 - BiRADS IV.  Saw Dr Tamala Julian.  Recommended follow up mammogram.  Left breast mammogram 8/11 read as BiRADS III.  Had follow up with Dr Tamala Julian.  No biopsy recommended.  Follow up mammogram 08/17/10 - BiRADS II,  10/14/11 BiRADS II and 11/06/12 - Birads II.  Mammogram 11/18/13 - Birads I.     HEALTH MAINTENANCE.   Physical last visit.  She is s/p hysterectomy.   Colonoscopy 08/29/03 - diverticulosis and a 43mm polyp.  Per record pt due follow up colonoscopy 08/2013.   She declines f/u colonoscopy and IFOB.  Mammogram as outlined.   I spent 25 minutes with the patient and more than 50% of the time was spent in consultation regarding the above.

## 2014-03-03 NOTE — Progress Notes (Signed)
Pre visit review using our clinic review tool, if applicable. No additional management support is needed unless otherwise documented below in the visit note. 

## 2014-03-04 ENCOUNTER — Other Ambulatory Visit: Payer: Self-pay | Admitting: Internal Medicine

## 2014-03-04 DIAGNOSIS — E8809 Other disorders of plasma-protein metabolism, not elsewhere classified: Secondary | ICD-10-CM

## 2014-03-04 DIAGNOSIS — R779 Abnormality of plasma protein, unspecified: Secondary | ICD-10-CM

## 2014-03-04 NOTE — Progress Notes (Signed)
Orders placed for f/u labs.  

## 2014-03-06 ENCOUNTER — Encounter: Payer: Self-pay | Admitting: Internal Medicine

## 2014-03-06 DIAGNOSIS — R51 Headache: Secondary | ICD-10-CM | POA: Insufficient documentation

## 2014-03-06 DIAGNOSIS — R519 Headache, unspecified: Secondary | ICD-10-CM | POA: Insufficient documentation

## 2014-03-06 DIAGNOSIS — R9389 Abnormal findings on diagnostic imaging of other specified body structures: Secondary | ICD-10-CM | POA: Insufficient documentation

## 2014-03-06 DIAGNOSIS — R05 Cough: Secondary | ICD-10-CM | POA: Insufficient documentation

## 2014-03-06 DIAGNOSIS — R059 Cough, unspecified: Secondary | ICD-10-CM | POA: Insufficient documentation

## 2014-03-06 NOTE — Assessment & Plan Note (Signed)
Did describe the pain in the jaw as outlined.  Tylenol as directed.  Check ESR.

## 2014-03-06 NOTE — Assessment & Plan Note (Signed)
Blood pressure is under good control.  Same medications.  Follow.

## 2014-03-06 NOTE — Assessment & Plan Note (Addendum)
Better.  Only taking zyrtec now.  Have her use flonase nasal spray and saline nasal spray as directed.  Robitussin DM as directed.  Stop zyrtec temporarily.  May be drying.  Follow.

## 2014-03-06 NOTE — Assessment & Plan Note (Signed)
On protonix.  Has noticed some increased acid reflux.  Will increase protonix to bid dosing.  See if can control acid and see if this will help cough.  Follow.

## 2014-03-06 NOTE — Assessment & Plan Note (Signed)
Recent cxr with possible left lung nodule.  Discussed with her today.  Check CT chest.

## 2014-03-06 NOTE — Assessment & Plan Note (Signed)
Followed by Dr Forde Dandy.  Most recent calcium was wnl.  He just checked her vitamin D level.  See if can obtain results.

## 2014-03-13 ENCOUNTER — Other Ambulatory Visit (INDEPENDENT_AMBULATORY_CARE_PROVIDER_SITE_OTHER): Payer: Medicare Other

## 2014-03-13 ENCOUNTER — Ambulatory Visit: Payer: Self-pay | Admitting: Internal Medicine

## 2014-03-13 DIAGNOSIS — R779 Abnormality of plasma protein, unspecified: Secondary | ICD-10-CM

## 2014-03-13 DIAGNOSIS — E8809 Other disorders of plasma-protein metabolism, not elsewhere classified: Secondary | ICD-10-CM

## 2014-03-13 LAB — PROTEIN, TOTAL: Total Protein: 7.7 g/dL (ref 6.0–8.3)

## 2014-03-13 LAB — SEDIMENTATION RATE: SED RATE: 45 mm/h — AB (ref 0–22)

## 2014-03-14 ENCOUNTER — Ambulatory Visit (INDEPENDENT_AMBULATORY_CARE_PROVIDER_SITE_OTHER): Payer: Medicare Other | Admitting: Internal Medicine

## 2014-03-14 ENCOUNTER — Encounter: Payer: Self-pay | Admitting: Internal Medicine

## 2014-03-14 VITALS — BP 120/70 | HR 79 | Temp 97.9°F | Ht 64.0 in | Wt 152.8 lb

## 2014-03-14 DIAGNOSIS — R938 Abnormal findings on diagnostic imaging of other specified body structures: Secondary | ICD-10-CM

## 2014-03-14 DIAGNOSIS — R05 Cough: Secondary | ICD-10-CM

## 2014-03-14 DIAGNOSIS — R059 Cough, unspecified: Secondary | ICD-10-CM

## 2014-03-14 DIAGNOSIS — R9389 Abnormal findings on diagnostic imaging of other specified body structures: Secondary | ICD-10-CM

## 2014-03-14 NOTE — Progress Notes (Signed)
Pre visit review using our clinic review tool, if applicable. No additional management support is needed unless otherwise documented below in the visit note. 

## 2014-03-16 ENCOUNTER — Encounter: Payer: Self-pay | Admitting: Internal Medicine

## 2014-03-16 NOTE — Progress Notes (Signed)
Subjective:    Patient ID: Heather Cooper, female    DOB: April 30, 1931, 80 y.o.   MRN: [id number]  HPI 80 year old female with past history of hypertension, hypercholesterolemia and osteoporosis who comes in today as a work in today to discuss current symptoms and to discuss CT results.   Cough is better.  She feels better.  No sob.  No chest pain or tightness.  Recent CT chest was performed to f/u abnormal cxr.  CT reveals medial right upper lobe mass and mediastinal adenopathy.  See CT report.  We discussed the results today.  Discussed the need for further w/up.  Dr Oliva Bustard agreed to see pt next week.  She is accompanied by her husband and her son.       Past Medical History  Diagnosis Date  . Hypertension   . Hypercholesterolemia   . Osteoporosis   . Hypercalcemia     familial hypocalciuric hypercalcemia  . Chicken pox   . Thyroid disease     Current Outpatient Prescriptions on File Prior to Visit  Medication Sig Dispense Refill  . amLODipine (NORVASC) 10 MG tablet Take 1 tablet (10 mg total) by mouth daily.  30 tablet  5  . aspirin 81 MG tablet Take 81 mg by mouth daily.      . Calcium Carbonate (CALTRATE 600 PO) Take 1 tablet by mouth daily.      . cholecalciferol (VITAMIN D) 400 UNITS TABS Take 400 Units by mouth daily.      . fluticasone (FLONASE) 50 MCG/ACT nasal spray Place 2 sprays into both nostrils daily.  16 g  5  . nystatin cream (MYCOSTATIN) Apply topically 2 (two) times daily.  30 g  0  . olmesartan-hydrochlorothiazide (BENICAR HCT) 20-12.5 MG per tablet Take 1 tablet by mouth daily.  30 tablet  5  . pantoprazole (PROTONIX) 40 MG tablet Take 1 tablet (40 mg total) by mouth daily.  30 tablet  5   No current facility-administered medications on file prior to visit.    Review of Systems Patient denies any headache, lightheadedness or dizziness.  No sinus pressure.  No drainage.  No chest pain, tightness or palpitations.  No increased shortness of breath.  Cough better.   No nausea or vomiting.  States overall she feels good.  Discussed CT results.        Objective:   Physical Exam  Filed Vitals:   03/14/14 0914  BP: 120/70  Pulse: 79  Temp: 97.9 F (44.79 C)   80 year old female in no acute distress.   HEENT:  Nares- clear.  Oropharynx - without lesions.  NECK:  Supple.  Nontender.   HEART:  Appears to be regular. LUNGS:  No crackles or wheezing audible.  Respirations even and unlabored.  ABDOMEN:  Soft, nontender.  Bowel sounds present and normal.  No audible abdominal bruit.      Assessment & Plan:  PREVIOUS LOWER EXTREMITY SWELLING.  Resolved.    ABNORMAL MAMMOGRAM.  Bilateral mammogram 2/11 - BiRADS IV.  Saw Dr Tamala Julian.  Recommended follow up mammogram.  Left breast mammogram 8/11 read as BiRADS III.  Had follow up with Dr Tamala Julian.  No biopsy recommended.  Follow up mammogram 08/17/10 - BiRADS II,  10/14/11 BiRADS II and 11/06/12 - Birads II.  Mammogram 11/18/13 - Birads I.     HEALTH MAINTENANCE.  Physical 10/11/13.  She is s/p hysterectomy.   Colonoscopy 08/29/03 - diverticulosis and a 9mm polyp.  Per record pt  due follow up colonoscopy 08/2013.   She declines f/u colonoscopy and IFOB.  Mammogram as outlined.

## 2014-03-16 NOTE — Assessment & Plan Note (Signed)
Recent abnormal cxr.  F/u CT chest abnormal as outlined.  See report.  Right upper lobe mass as outlined.  Dr Oliva Bustard agreed to see pt next week.  Results and f/u discussed with the patient, her husband and her son.  Further w/up pending Dr Metro Kung review and recommendations.

## 2014-03-16 NOTE — Assessment & Plan Note (Signed)
Better  

## 2014-03-18 ENCOUNTER — Ambulatory Visit: Payer: Self-pay | Admitting: Oncology

## 2014-03-18 LAB — COMPREHENSIVE METABOLIC PANEL
ALBUMIN: 4 g/dL (ref 3.4–5.0)
Alkaline Phosphatase: 111 U/L
Anion Gap: 10 (ref 7–16)
BUN: 16 mg/dL (ref 7–18)
Bilirubin,Total: 0.4 mg/dL (ref 0.2–1.0)
CALCIUM: 9.6 mg/dL (ref 8.5–10.1)
CHLORIDE: 102 mmol/L (ref 98–107)
CREATININE: 0.91 mg/dL (ref 0.60–1.30)
Co2: 27 mmol/L (ref 21–32)
EGFR (African American): >60
GLUCOSE: 111 mg/dL — AB (ref 65–99)
OSMOLALITY: 279 (ref 275–301)
Potassium: 3.7 mmol/L (ref 3.5–5.1)
SGOT(AST): 18 U/L (ref 15–37)
SGPT (ALT): 22 U/L
Sodium: 139 mmol/L (ref 136–145)
Total Protein: 7.9 g/dL (ref 6.4–8.2)

## 2014-03-18 LAB — CBC CANCER CENTER
BASOS ABS: 0.1 x10 3/mm (ref 0.0–0.1)
BASOS PCT: 0.6 %
Eosinophil #: 0.2 x10 3/mm (ref 0.0–0.7)
Eosinophil %: 1.9 %
HCT: 37.5 % (ref 35.0–47.0)
HGB: 12.5 g/dL (ref 12.0–16.0)
LYMPHS ABS: 1.9 x10 3/mm (ref 1.0–3.6)
LYMPHS PCT: 21.3 %
MCH: 27.9 pg (ref 26.0–34.0)
MCHC: 33.5 g/dL (ref 32.0–36.0)
MCV: 83 fL (ref 80–100)
MONO ABS: 0.6 x10 3/mm (ref 0.2–0.9)
Monocyte %: 7.2 %
Neutrophil #: 6.1 x10 3/mm (ref 1.4–6.5)
Neutrophil %: 69 %
Platelet: 312 x10 3/mm (ref 150–440)
RBC: 4.49 10*6/uL (ref 3.80–5.20)
RDW: 14 % (ref 11.5–14.5)
WBC: 8.8 x10 3/mm (ref 3.6–11.0)

## 2014-03-18 LAB — PROTIME-INR
INR: 1
PROTHROMBIN TIME: 13.2 s (ref 11.5–14.7)
Protime: 13.2 seconds (ref 10.0–13.8)

## 2014-03-18 LAB — CBC AND DIFFERENTIAL
HEMATOCRIT: 38 % (ref 36–46)
Hemoglobin: 12.5 g/dL (ref 12.0–16.0)
Platelets: 312 10*3/uL (ref 150–399)
WBC: 8.8 10*3/mL

## 2014-03-18 LAB — BASIC METABOLIC PANEL
BUN: 16 mg/dL (ref 4–21)
Creatinine: 0.9 mg/dL (ref 0.5–1.1)
Glucose: 111 mg/dL
Potassium: 3.7 mmol/L (ref 3.4–5.3)
SODIUM: 139 mmol/L (ref 137–147)

## 2014-03-18 LAB — HEPATIC FUNCTION PANEL
ALT: 22 U/L (ref 7–35)
AST: 18 U/L (ref 13–35)
Alkaline Phosphatase: 111 U/L (ref 25–125)

## 2014-03-18 LAB — POCT INR: INR: 1 (ref 0.9–1.1)

## 2014-03-19 ENCOUNTER — Encounter: Payer: Self-pay | Admitting: Internal Medicine

## 2014-03-20 ENCOUNTER — Ambulatory Visit: Payer: Self-pay | Admitting: Internal Medicine

## 2014-03-20 DIAGNOSIS — I10 Essential (primary) hypertension: Secondary | ICD-10-CM

## 2014-03-20 DIAGNOSIS — Z01810 Encounter for preprocedural cardiovascular examination: Secondary | ICD-10-CM

## 2014-03-21 ENCOUNTER — Ambulatory Visit: Payer: Self-pay | Admitting: Internal Medicine

## 2014-03-27 LAB — PATHOLOGY REPORT

## 2014-03-31 ENCOUNTER — Ambulatory Visit: Payer: Self-pay | Admitting: Oncology

## 2014-04-08 ENCOUNTER — Ambulatory Visit: Payer: Self-pay | Admitting: Vascular Surgery

## 2014-04-13 ENCOUNTER — Ambulatory Visit: Payer: Self-pay | Admitting: Oncology

## 2014-04-14 LAB — CBC CANCER CENTER
BASOS ABS: 0.1 x10 3/mm (ref 0.0–0.1)
BASOS PCT: 0.7 %
EOS ABS: 0.1 x10 3/mm (ref 0.0–0.7)
EOS PCT: 1.6 %
HCT: 34.9 % — AB (ref 35.0–47.0)
HGB: 11.2 g/dL — ABNORMAL LOW (ref 12.0–16.0)
LYMPHS ABS: 1.8 x10 3/mm (ref 1.0–3.6)
LYMPHS PCT: 21.1 %
MCH: 27.4 pg (ref 26.0–34.0)
MCHC: 32.3 g/dL (ref 32.0–36.0)
MCV: 85 fL (ref 80–100)
Monocyte #: 0.7 x10 3/mm (ref 0.2–0.9)
Monocyte %: 7.7 %
Neutrophil #: 5.8 x10 3/mm (ref 1.4–6.5)
Neutrophil %: 68.9 %
Platelet: 284 x10 3/mm (ref 150–440)
RBC: 4.11 10*6/uL (ref 3.80–5.20)
RDW: 13.9 % (ref 11.5–14.5)
WBC: 8.5 x10 3/mm (ref 3.6–11.0)

## 2014-04-14 LAB — COMPREHENSIVE METABOLIC PANEL
ALT: 17 U/L
Albumin: 3.6 g/dL (ref 3.4–5.0)
Alkaline Phosphatase: 103 U/L
Anion Gap: 5 — ABNORMAL LOW (ref 7–16)
BUN: 18 mg/dL (ref 7–18)
Bilirubin,Total: 0.3 mg/dL (ref 0.2–1.0)
CO2: 29 mmol/L (ref 21–32)
CREATININE: 0.93 mg/dL (ref 0.60–1.30)
Calcium, Total: 9.1 mg/dL (ref 8.5–10.1)
Chloride: 107 mmol/L (ref 98–107)
EGFR (African American): >60
EGFR (Non-African Amer.): >60
Glucose: 102 mg/dL — ABNORMAL HIGH (ref 65–99)
Osmolality: 283 (ref 275–301)
POTASSIUM: 4.5 mmol/L (ref 3.5–5.1)
SGOT(AST): 15 U/L (ref 15–37)
Sodium: 141 mmol/L (ref 136–145)
Total Protein: 7.9 g/dL (ref 6.4–8.2)

## 2014-04-21 LAB — BASIC METABOLIC PANEL
ANION GAP: 10 (ref 7–16)
BUN: 18 mg/dL (ref 7–18)
CREATININE: 0.91 mg/dL (ref 0.60–1.30)
Calcium, Total: 9.8 mg/dL (ref 8.5–10.1)
Chloride: 100 mmol/L (ref 98–107)
Co2: 27 mmol/L (ref 21–32)
EGFR (African American): >60
EGFR (Non-African Amer.): >60
Glucose: 159 mg/dL — ABNORMAL HIGH (ref 65–99)
OSMOLALITY: 279 (ref 275–301)
POTASSIUM: 3.7 mmol/L (ref 3.5–5.1)
Sodium: 137 mmol/L (ref 136–145)

## 2014-04-21 LAB — CBC CANCER CENTER
BASOS PCT: 0.1 %
Basophil #: 0 x10 3/mm (ref 0.0–0.1)
EOS PCT: 1.3 %
Eosinophil #: 0.1 x10 3/mm (ref 0.0–0.7)
HCT: 36.6 % (ref 35.0–47.0)
HGB: 12.1 g/dL (ref 12.0–16.0)
LYMPHS ABS: 1.6 x10 3/mm (ref 1.0–3.6)
Lymphocyte %: 15.5 %
MCH: 27.6 pg (ref 26.0–34.0)
MCHC: 33.1 g/dL (ref 32.0–36.0)
MCV: 83 fL (ref 80–100)
Monocyte #: 0.7 x10 3/mm (ref 0.2–0.9)
Monocyte %: 6.9 %
NEUTROS ABS: 8.1 x10 3/mm — AB (ref 1.4–6.5)
Neutrophil %: 76.2 %
Platelet: 299 x10 3/mm (ref 150–440)
RBC: 4.4 10*6/uL (ref 3.80–5.20)
RDW: 14.2 % (ref 11.5–14.5)
WBC: 10.6 x10 3/mm (ref 3.6–11.0)

## 2014-04-28 LAB — BASIC METABOLIC PANEL
Anion Gap: 9 (ref 7–16)
BUN: 16 mg/dL (ref 7–18)
CHLORIDE: 102 mmol/L (ref 98–107)
CO2: 27 mmol/L (ref 21–32)
Calcium, Total: 9.5 mg/dL (ref 8.5–10.1)
Creatinine: 0.73 mg/dL (ref 0.60–1.30)
EGFR (African American): >60
EGFR (Non-African Amer.): >60
GLUCOSE: 123 mg/dL — AB (ref 65–99)
Osmolality: 278 (ref 275–301)
Potassium: 3.7 mmol/L (ref 3.5–5.1)
SODIUM: 138 mmol/L (ref 136–145)

## 2014-04-28 LAB — CBC CANCER CENTER
Basophil #: 0 x10 3/mm (ref 0.0–0.1)
Basophil %: 0.2 %
Eosinophil #: 0.1 x10 3/mm (ref 0.0–0.7)
Eosinophil %: 0.9 %
HCT: 34.5 % — AB (ref 35.0–47.0)
HGB: 11.3 g/dL — ABNORMAL LOW (ref 12.0–16.0)
Lymphocyte #: 1.2 x10 3/mm (ref 1.0–3.6)
Lymphocyte %: 16 %
MCH: 27.3 pg (ref 26.0–34.0)
MCHC: 32.7 g/dL (ref 32.0–36.0)
MCV: 84 fL (ref 80–100)
MONOS PCT: 6 %
Monocyte #: 0.4 x10 3/mm (ref 0.2–0.9)
NEUTROS ABS: 5.6 x10 3/mm (ref 1.4–6.5)
NEUTROS PCT: 76.9 %
Platelet: 233 x10 3/mm (ref 150–440)
RBC: 4.12 10*6/uL (ref 3.80–5.20)
RDW: 14.3 % (ref 11.5–14.5)
WBC: 7.3 x10 3/mm (ref 3.6–11.0)

## 2014-04-28 LAB — MAGNESIUM: Magnesium: 1.7 mg/dL — ABNORMAL LOW

## 2014-05-05 LAB — CBC CANCER CENTER
Basophil #: 0 x10 3/mm (ref 0.0–0.1)
Basophil %: 0.5 %
Eosinophil #: 0 x10 3/mm (ref 0.0–0.7)
Eosinophil %: 1.1 %
HCT: 32.7 % — ABNORMAL LOW (ref 35.0–47.0)
HGB: 10.8 g/dL — AB (ref 12.0–16.0)
LYMPHS ABS: 0.5 x10 3/mm — AB (ref 1.0–3.6)
Lymphocyte %: 12.9 %
MCH: 27.7 pg (ref 26.0–34.0)
MCHC: 33.2 g/dL (ref 32.0–36.0)
MCV: 83 fL (ref 80–100)
MONO ABS: 0.4 x10 3/mm (ref 0.2–0.9)
Monocyte %: 10.1 %
NEUTROS PCT: 75.4 %
Neutrophil #: 3.1 x10 3/mm (ref 1.4–6.5)
PLATELETS: 209 x10 3/mm (ref 150–440)
RBC: 3.92 10*6/uL (ref 3.80–5.20)
RDW: 14.4 % (ref 11.5–14.5)
WBC: 4.1 x10 3/mm (ref 3.6–11.0)

## 2014-05-05 LAB — BASIC METABOLIC PANEL
Anion Gap: 10 (ref 7–16)
BUN: 15 mg/dL (ref 7–18)
Calcium, Total: 9.3 mg/dL (ref 8.5–10.1)
Chloride: 100 mmol/L (ref 98–107)
Co2: 26 mmol/L (ref 21–32)
Creatinine: 0.71 mg/dL (ref 0.60–1.30)
EGFR (Non-African Amer.): >60
GLUCOSE: 116 mg/dL — AB (ref 65–99)
Osmolality: 274 (ref 275–301)
Potassium: 3.7 mmol/L (ref 3.5–5.1)
Sodium: 136 mmol/L (ref 136–145)

## 2014-05-05 LAB — HEPATIC FUNCTION PANEL A (ARMC)
ALT: 24 U/L
Albumin: 3.5 g/dL (ref 3.4–5.0)
Alkaline Phosphatase: 102 U/L
BILIRUBIN TOTAL: 0.3 mg/dL (ref 0.2–1.0)
Bilirubin, Direct: 0.1 mg/dL (ref 0.0–0.2)
SGOT(AST): 16 U/L (ref 15–37)
TOTAL PROTEIN: 7.1 g/dL (ref 6.4–8.2)

## 2014-05-07 ENCOUNTER — Ambulatory Visit: Payer: Self-pay | Admitting: Vascular Surgery

## 2014-05-07 LAB — BASIC METABOLIC PANEL
Anion Gap: 6 — ABNORMAL LOW (ref 7–16)
BUN: 13 mg/dL (ref 7–18)
CALCIUM: 8.9 mg/dL (ref 8.5–10.1)
CHLORIDE: 105 mmol/L (ref 98–107)
CREATININE: 0.8 mg/dL (ref 0.60–1.30)
Co2: 28 mmol/L (ref 21–32)
EGFR (African American): >60
GLUCOSE: 106 mg/dL — AB (ref 65–99)
Osmolality: 278 (ref 275–301)
Potassium: 3.7 mmol/L (ref 3.5–5.1)
Sodium: 139 mmol/L (ref 136–145)

## 2014-05-11 LAB — AEROBIC CULTURE

## 2014-05-12 ENCOUNTER — Other Ambulatory Visit: Payer: Self-pay | Admitting: *Deleted

## 2014-05-12 MED ORDER — PANTOPRAZOLE SODIUM 40 MG PO TBEC: 40.0000 mg | DELAYED_RELEASE_TABLET | Freq: Every day | ORAL | Status: DC

## 2014-05-13 ENCOUNTER — Ambulatory Visit: Payer: Self-pay | Admitting: Oncology

## 2014-05-14 LAB — COMPREHENSIVE METABOLIC PANEL
ALK PHOS: 99 U/L
ALT: 21 U/L
ANION GAP: 11 (ref 7–16)
Albumin: 3.5 g/dL (ref 3.4–5.0)
BUN: 13 mg/dL (ref 7–18)
Bilirubin,Total: 0.3 mg/dL (ref 0.2–1.0)
CALCIUM: 9.6 mg/dL (ref 8.5–10.1)
CHLORIDE: 100 mmol/L (ref 98–107)
CREATININE: 0.87 mg/dL (ref 0.60–1.30)
Co2: 28 mmol/L (ref 21–32)
EGFR (African American): >60
EGFR (Non-African Amer.): >60
Glucose: 136 mg/dL — ABNORMAL HIGH (ref 65–99)
OSMOLALITY: 280 (ref 275–301)
POTASSIUM: 4.1 mmol/L (ref 3.5–5.1)
SGOT(AST): 15 U/L (ref 15–37)
Sodium: 139 mmol/L (ref 136–145)
Total Protein: 7.2 g/dL (ref 6.4–8.2)

## 2014-05-14 LAB — CBC CANCER CENTER
BASOS PCT: 0.8 %
Basophil #: 0 x10 3/mm (ref 0.0–0.1)
EOS ABS: 0.1 x10 3/mm (ref 0.0–0.7)
Eosinophil %: 2.5 %
HCT: 31.2 % — ABNORMAL LOW (ref 35.0–47.0)
HGB: 10.5 g/dL — ABNORMAL LOW (ref 12.0–16.0)
Lymphocyte #: 0.7 x10 3/mm — ABNORMAL LOW (ref 1.0–3.6)
Lymphocyte %: 15.2 %
MCH: 27.9 pg (ref 26.0–34.0)
MCHC: 33.7 g/dL (ref 32.0–36.0)
MCV: 83 fL (ref 80–100)
Monocyte #: 0.6 x10 3/mm (ref 0.2–0.9)
Monocyte %: 14 %
NEUTROS ABS: 3 x10 3/mm (ref 1.4–6.5)
NEUTROS PCT: 67.5 %
Platelet: 203 x10 3/mm (ref 150–440)
RBC: 3.77 10*6/uL — AB (ref 3.80–5.20)
RDW: 14.9 % — ABNORMAL HIGH (ref 11.5–14.5)
WBC: 4.4 x10 3/mm (ref 3.6–11.0)

## 2014-05-14 LAB — MAGNESIUM: Magnesium: 1.7 mg/dL — ABNORMAL LOW

## 2014-05-16 ENCOUNTER — Ambulatory Visit (INDEPENDENT_AMBULATORY_CARE_PROVIDER_SITE_OTHER): Payer: Medicare Other | Admitting: Internal Medicine

## 2014-05-16 ENCOUNTER — Encounter: Payer: Self-pay | Admitting: Internal Medicine

## 2014-05-16 ENCOUNTER — Encounter (INDEPENDENT_AMBULATORY_CARE_PROVIDER_SITE_OTHER): Payer: Self-pay

## 2014-05-16 VITALS — BP 130/58 | HR 83 | Temp 97.0°F | Wt 143.5 lb

## 2014-05-16 DIAGNOSIS — R05 Cough: Secondary | ICD-10-CM

## 2014-05-16 DIAGNOSIS — E7800 Pure hypercholesterolemia, unspecified: Secondary | ICD-10-CM

## 2014-05-16 DIAGNOSIS — I10 Essential (primary) hypertension: Secondary | ICD-10-CM

## 2014-05-16 DIAGNOSIS — C3490 Malignant neoplasm of unspecified part of unspecified bronchus or lung: Secondary | ICD-10-CM

## 2014-05-16 DIAGNOSIS — R059 Cough, unspecified: Secondary | ICD-10-CM

## 2014-05-16 MED ORDER — AMLODIPINE BESYLATE 10 MG PO TABS: 10.0000 mg | ORAL_TABLET | Freq: Every day | ORAL | Status: DC

## 2014-05-16 MED ORDER — PANTOPRAZOLE SODIUM 40 MG PO TBEC: 40.0000 mg | DELAYED_RELEASE_TABLET | Freq: Every day | ORAL | Status: DC

## 2014-05-16 NOTE — Progress Notes (Signed)
Pre visit review using our clinic review tool, if applicable. No additional management support is needed unless otherwise documented below in the visit note. 

## 2014-05-21 LAB — CBC CANCER CENTER
Basophil #: 0 x10 3/mm (ref 0.0–0.1)
Basophil %: 0.2 %
EOS PCT: 0.8 %
Eosinophil #: 0 x10 3/mm (ref 0.0–0.7)
HCT: 32.6 % — ABNORMAL LOW (ref 35.0–47.0)
HGB: 10.8 g/dL — AB (ref 12.0–16.0)
Lymphocyte #: 0.5 x10 3/mm — ABNORMAL LOW (ref 1.0–3.6)
Lymphocyte %: 8.7 %
MCH: 27.4 pg (ref 26.0–34.0)
MCHC: 33.1 g/dL (ref 32.0–36.0)
MCV: 83 fL (ref 80–100)
MONO ABS: 0.4 x10 3/mm (ref 0.2–0.9)
Monocyte %: 7.3 %
Neutrophil #: 4.8 x10 3/mm (ref 1.4–6.5)
Neutrophil %: 83 %
PLATELETS: 247 x10 3/mm (ref 150–440)
RBC: 3.93 10*6/uL (ref 3.80–5.20)
RDW: 16.1 % — ABNORMAL HIGH (ref 11.5–14.5)
WBC: 5.8 x10 3/mm (ref 3.6–11.0)

## 2014-05-21 LAB — COMPREHENSIVE METABOLIC PANEL
Albumin: 3.5 g/dL (ref 3.4–5.0)
Alkaline Phosphatase: 93 U/L
Anion Gap: 7 (ref 7–16)
BUN: 20 mg/dL — ABNORMAL HIGH (ref 7–18)
Bilirubin,Total: 0.3 mg/dL (ref 0.2–1.0)
CALCIUM: 9.1 mg/dL (ref 8.5–10.1)
CHLORIDE: 101 mmol/L (ref 98–107)
Co2: 29 mmol/L (ref 21–32)
Creatinine: 0.83 mg/dL (ref 0.60–1.30)
EGFR (African American): >60
Glucose: 124 mg/dL — ABNORMAL HIGH (ref 65–99)
Osmolality: 278 (ref 275–301)
Potassium: 3.8 mmol/L (ref 3.5–5.1)
SGOT(AST): 13 U/L — ABNORMAL LOW (ref 15–37)
SGPT (ALT): 25 U/L
SODIUM: 137 mmol/L (ref 136–145)
Total Protein: 6.9 g/dL (ref 6.4–8.2)

## 2014-05-21 LAB — MAGNESIUM: MAGNESIUM: 1.5 mg/dL — AB

## 2014-05-25 ENCOUNTER — Encounter: Payer: Self-pay | Admitting: Internal Medicine

## 2014-05-25 NOTE — Progress Notes (Signed)
Subjective:    Patient ID: Heather Cooper, female    DOB: 1931-04-10, 80 y.o.   MRN: [id number]  HPI 80 year old female with past history of hypertension, hypercholesterolemia and osteoporosis who comes in today for a scheduled follow up.  Has recently been diagnosed with lung cancer.  She is accompanied by her son.  History obtained from both of them.  Undergoing XRT and chemo.  Being followed at the cancer center.  Still with some cough, but is better.  On prednisone and using tessalon perles.  Had trouble with her port.  Saw vascular surgery.  Had removed.  No infection.  She is eating and drinking well.  No nausea or vomiting.  Bowels moving.  Overall she feels she is doing relatively well.  Energy ok.       Past Medical History  Diagnosis Date  . Hypertension   . Hypercholesterolemia   . Osteoporosis   . Hypercalcemia     familial hypocalciuric hypercalcemia  . Chicken pox   . Thyroid disease     Current Outpatient Prescriptions on File Prior to Visit  Medication Sig Dispense Refill  . aspirin 81 MG tablet Take 81 mg by mouth daily.    . Calcium Carbonate (CALTRATE 600 PO) Take 1 tablet by mouth daily.    . cholecalciferol (VITAMIN D) 400 UNITS TABS Take 400 Units by mouth daily.    . fluticasone (FLONASE) 50 MCG/ACT nasal spray Place 2 sprays into both nostrils daily. 16 g 5  . nystatin cream (MYCOSTATIN) Apply topically 2 (two) times daily. 30 g 0  . olmesartan-hydrochlorothiazide (BENICAR HCT) 20-12.5 MG per tablet Take 1 tablet by mouth daily. 30 tablet 5   No current facility-administered medications on file prior to visit.    Review of Systems Patient denies any headache, lightheadedness or dizziness.  No sinus pressure.  No drainage.  No chest pain, tightness or palpitations.  No increased shortness of breath.  Cough better.  No nausea or vomiting.  States overall she feels good.  Undergoing chemo and xrt.         Objective:   Physical Exam  Filed Vitals:   05/16/14 1207  BP: 130/58  Pulse: 83  Temp: 97 F (36.1 C)   Blood pressure recheck:  54/36  80 year old female in no acute distress.   HEENT:  Nares- clear.  Oropharynx - without lesions.  NECK:  Supple.  Nontender.   HEART:  Appears to be regular. LUNGS:  No crackles or wheezing audible.  Respirations even and unlabored.  ABDOMEN:  Soft, nontender.  Bowel sounds present and normal.  No audible abdominal bruit.  EXTREMITIES:  No increased edema.       Assessment & Plan:  1. Malignant neoplasm of lung, unspecified laterality, unspecified part of lung Followed at the cancer center.  Undergoing chemo and xrt.  Doing well.  Follow.    2. Essential hypertension Blood pressure controlled.  Follow.  Same medication regimen.    3. Hypercholesterolemia Follow cholesterol panel.    4. Cough Better.  See above.    ABNORMAL MAMMOGRAM.  Bilateral mammogram 2/11 - BiRADS IV.  Saw Dr Tamala Julian.  Recommended follow up mammogram.  Left breast mammogram 8/11 read as BiRADS III.  Had follow up with Dr Tamala Julian.  No biopsy recommended.  Follow up mammogram 08/17/10 - BiRADS II,  10/14/11 BiRADS II and 11/06/12 - Birads II.  Mammogram 11/18/13 - Birads I.     HEALTH MAINTENANCE.  Physical 10/11/13.  She is s/p hysterectomy.   Colonoscopy 08/29/03 - diverticulosis and a 53mm polyp.  Per record pt due follow up colonoscopy 08/2013.   She declines f/u colonoscopy and IFOB.  Mammogram as outlined.

## 2014-05-28 LAB — CREATININE, SERUM
CREATININE: 0.9 mg/dL (ref 0.60–1.30)
EGFR (African American): >60

## 2014-05-28 LAB — CBC CANCER CENTER
Basophil #: 0 x10 3/mm (ref 0.0–0.1)
Basophil %: 0.2 %
EOS ABS: 0 x10 3/mm (ref 0.0–0.7)
EOS PCT: 0.3 %
HCT: 32.9 % — AB (ref 35.0–47.0)
HGB: 10.9 g/dL — ABNORMAL LOW (ref 12.0–16.0)
LYMPHS PCT: 11.9 %
Lymphocyte #: 0.6 x10 3/mm — ABNORMAL LOW (ref 1.0–3.6)
MCH: 27.6 pg (ref 26.0–34.0)
MCHC: 33.1 g/dL (ref 32.0–36.0)
MCV: 83 fL (ref 80–100)
MONOS PCT: 9.8 %
Monocyte #: 0.5 x10 3/mm (ref 0.2–0.9)
NEUTROS PCT: 77.8 %
Neutrophil #: 4.2 x10 3/mm (ref 1.4–6.5)
Platelet: 201 x10 3/mm (ref 150–440)
RBC: 3.95 10*6/uL (ref 3.80–5.20)
RDW: 15.9 % — ABNORMAL HIGH (ref 11.5–14.5)
WBC: 5.4 x10 3/mm (ref 3.6–11.0)

## 2014-05-28 LAB — POTASSIUM: Potassium: 4.2 mmol/L (ref 3.5–5.1)

## 2014-05-28 LAB — MAGNESIUM: Magnesium: 1.6 mg/dL — ABNORMAL LOW

## 2014-06-11 ENCOUNTER — Ambulatory Visit: Payer: Self-pay | Admitting: Oncology

## 2014-06-13 ENCOUNTER — Ambulatory Visit: Payer: Self-pay | Admitting: Oncology

## 2014-07-02 LAB — CBC CANCER CENTER
BASOS ABS: 0 x10 3/mm (ref 0.0–0.1)
Basophil %: 0.3 %
EOS ABS: 0 x10 3/mm (ref 0.0–0.7)
Eosinophil %: 0.5 %
HCT: 32.1 % — ABNORMAL LOW (ref 35.0–47.0)
HGB: 10.8 g/dL — AB (ref 12.0–16.0)
LYMPHS PCT: 26.4 %
Lymphocyte #: 1.4 x10 3/mm (ref 1.0–3.6)
MCH: 29.6 pg (ref 26.0–34.0)
MCHC: 33.5 g/dL (ref 32.0–36.0)
MCV: 88 fL (ref 80–100)
Monocyte #: 0.6 x10 3/mm (ref 0.2–0.9)
Monocyte %: 11.8 %
NEUTROS ABS: 3.1 x10 3/mm (ref 1.4–6.5)
NEUTROS PCT: 61 %
PLATELETS: 286 x10 3/mm (ref 150–440)
RBC: 3.64 10*6/uL — ABNORMAL LOW (ref 3.80–5.20)
RDW: 21.6 % — ABNORMAL HIGH (ref 11.5–14.5)
WBC: 5.2 x10 3/mm (ref 3.6–11.0)

## 2014-07-02 LAB — COMPREHENSIVE METABOLIC PANEL
ANION GAP: 6 — AB (ref 7–16)
Albumin: 3.6 g/dL (ref 3.4–5.0)
Alkaline Phosphatase: 93 U/L
BILIRUBIN TOTAL: 0.4 mg/dL (ref 0.2–1.0)
BUN: 15 mg/dL (ref 7–18)
CHLORIDE: 104 mmol/L (ref 98–107)
Calcium, Total: 9.4 mg/dL (ref 8.5–10.1)
Co2: 29 mmol/L (ref 21–32)
Creatinine: 0.84 mg/dL (ref 0.60–1.30)
EGFR (African American): >60
EGFR (Non-African Amer.): >60
Glucose: 116 mg/dL — ABNORMAL HIGH (ref 65–99)
Osmolality: 279 (ref 275–301)
Potassium: 4.7 mmol/L (ref 3.5–5.1)
SGOT(AST): 13 U/L — ABNORMAL LOW (ref 15–37)
SGPT (ALT): 23 U/L
Sodium: 139 mmol/L (ref 136–145)
TOTAL PROTEIN: 7.3 g/dL (ref 6.4–8.2)

## 2014-07-14 ENCOUNTER — Ambulatory Visit: Payer: Self-pay | Admitting: Oncology

## 2014-07-16 LAB — CBC CANCER CENTER
BASOS PCT: 0.5 %
Basophil #: 0 x10 3/mm (ref 0.0–0.1)
EOS PCT: 0.2 %
Eosinophil #: 0 x10 3/mm (ref 0.0–0.7)
HCT: 31.6 % — AB (ref 35.0–47.0)
HGB: 10.8 g/dL — AB (ref 12.0–16.0)
Lymphocyte #: 1.2 x10 3/mm (ref 1.0–3.6)
Lymphocyte %: 12.4 %
MCH: 30 pg (ref 26.0–34.0)
MCHC: 34.1 g/dL (ref 32.0–36.0)
MCV: 88 fL (ref 80–100)
MONO ABS: 1.3 x10 3/mm — AB (ref 0.2–0.9)
Monocyte %: 14.1 %
NEUTROS PCT: 72.8 %
Neutrophil #: 6.9 x10 3/mm — ABNORMAL HIGH (ref 1.4–6.5)
Platelet: 247 x10 3/mm (ref 150–440)
RBC: 3.59 10*6/uL — AB (ref 3.80–5.20)
RDW: 18.4 % — ABNORMAL HIGH (ref 11.5–14.5)
WBC: 9.5 x10 3/mm (ref 3.6–11.0)

## 2014-07-16 LAB — COMPREHENSIVE METABOLIC PANEL
ALBUMIN: 3.3 g/dL — AB (ref 3.4–5.0)
ALK PHOS: 74 U/L (ref 46–116)
Anion Gap: 11 (ref 7–16)
BUN: 17 mg/dL (ref 7–18)
Bilirubin,Total: 0.6 mg/dL (ref 0.2–1.0)
CO2: 26 mmol/L (ref 21–32)
Calcium, Total: 8.8 mg/dL (ref 8.5–10.1)
Chloride: 95 mmol/L — ABNORMAL LOW (ref 98–107)
Creatinine: 1.27 mg/dL (ref 0.60–1.30)
EGFR (African American): 52 — ABNORMAL LOW
EGFR (Non-African Amer.): 43 — ABNORMAL LOW
GLUCOSE: 165 mg/dL — AB (ref 65–99)
OSMOLALITY: 270 (ref 275–301)
Potassium: 3.6 mmol/L (ref 3.5–5.1)
SGOT(AST): 16 U/L (ref 15–37)
SGPT (ALT): 19 U/L (ref 14–63)
SODIUM: 132 mmol/L — AB (ref 136–145)
Total Protein: 7.4 g/dL (ref 6.4–8.2)

## 2014-08-12 ENCOUNTER — Ambulatory Visit
Admit: 2014-08-12 | Disposition: A | Discharge status: Home / Self Care | Payer: Self-pay | Attending: Oncology | Admitting: Oncology

## 2014-09-11 LAB — COMPREHENSIVE METABOLIC PANEL
Albumin: 3.8 g/dL
Alkaline Phosphatase: 84 U/L
Anion Gap: 6 — ABNORMAL LOW (ref 7–16)
BILIRUBIN TOTAL: 0.4 mg/dL
BUN: 16 mg/dL
CHLORIDE: 101 mmol/L
Calcium, Total: 9.3 mg/dL
Co2: 26 mmol/L
Creatinine: 0.68 mg/dL
EGFR (Non-African Amer.): >60
GLUCOSE: 173 mg/dL — AB
Potassium: 3.7 mmol/L
SGOT(AST): 18 U/L
SGPT (ALT): 14 U/L
SODIUM: 133 mmol/L — AB
TOTAL PROTEIN: 7.3 g/dL

## 2014-09-11 LAB — MAGNESIUM: Magnesium: 1.8 mg/dL

## 2014-09-12 ENCOUNTER — Ambulatory Visit
Admit: 2014-09-12 | Disposition: A | Discharge status: Home / Self Care | Payer: Self-pay | Attending: Oncology | Admitting: Oncology

## 2014-09-13 LAB — CREATININE, SERUM: Creatine, Serum: 0.68

## 2014-09-15 ENCOUNTER — Encounter: Payer: Self-pay | Admitting: Internal Medicine

## 2014-09-15 ENCOUNTER — Ambulatory Visit: Payer: Medicare Other | Admitting: Internal Medicine

## 2014-09-15 ENCOUNTER — Ambulatory Visit (INDEPENDENT_AMBULATORY_CARE_PROVIDER_SITE_OTHER): Payer: Medicare Other | Admitting: Internal Medicine

## 2014-09-15 VITALS — BP 120/50 | HR 84 | Temp 97.9°F | Ht 64.0 in | Wt 137.2 lb

## 2014-09-15 DIAGNOSIS — R059 Cough, unspecified: Secondary | ICD-10-CM

## 2014-09-15 DIAGNOSIS — K219 Gastro-esophageal reflux disease without esophagitis: Secondary | ICD-10-CM

## 2014-09-15 DIAGNOSIS — I10 Essential (primary) hypertension: Secondary | ICD-10-CM

## 2014-09-15 DIAGNOSIS — C3490 Malignant neoplasm of unspecified part of unspecified bronchus or lung: Secondary | ICD-10-CM

## 2014-09-15 DIAGNOSIS — E7800 Pure hypercholesterolemia, unspecified: Secondary | ICD-10-CM

## 2014-09-15 DIAGNOSIS — R05 Cough: Secondary | ICD-10-CM | POA: Diagnosis not present

## 2014-09-15 DIAGNOSIS — R739 Hyperglycemia, unspecified: Secondary | ICD-10-CM

## 2014-09-15 NOTE — Progress Notes (Signed)
Patient ID: Heather Cooper, female   DOB: 1931/05/03, 81 y.o.   MRN: [id number]   Subjective:    Patient ID: Heather Cooper, female    DOB: January 31, 1931, 81 y.o.   MRN: [id number]  HPI  Patient here for a scheduled follow up.  She is accompanied by her husband.  History obtained from both of them.  She states she is doing well.  Eating and drinking well.  Appetite good.  Some fatigue.  Overall doing well.  Handling treatments well.  Being treated for lung cancer.  Followed by Dr Oliva Bustard.  Bowels stable.  Taking zyrtec for allergies.  Still some cough.  No chest congestion or sob.     Past Medical History  Diagnosis Date  . Hypertension   . Hypercholesterolemia   . Osteoporosis   . Hypercalcemia     familial hypocalciuric hypercalcemia  . Chicken pox   . Thyroid disease   . Lung cancer     Current Outpatient Prescriptions on File Prior to Visit  Medication Sig Dispense Refill  . amLODipine (NORVASC) 10 MG tablet Take 1 tablet (10 mg total) by mouth daily. 30 tablet 5  . aspirin 81 MG tablet Take 81 mg by mouth daily.    . Calcium Carbonate (CALTRATE 600 PO) Take 1 tablet by mouth daily.    . cholecalciferol (VITAMIN D) 400 UNITS TABS Take 400 Units by mouth daily.    [name] dextromethorphan (DELSYM) 30 MG/5ML liquid Take 30 mg by mouth every 12 (twelve) hours as needed for cough.    . fluticasone (FLONASE) 50 MCG/ACT nasal spray Place 2 sprays into both nostrils daily. 16 g 5  . nystatin cream (MYCOSTATIN) Apply topically 2 (two) times daily. 30 g 0  . olmesartan-hydrochlorothiazide (BENICAR HCT) 20-12.5 MG per tablet Take 1 tablet by mouth daily. 30 tablet 5  . pantoprazole (PROTONIX) 40 MG tablet Take 1 tablet (40 mg total) by mouth daily. 30 tablet 5  . PREDNISONE, PAK, PO Take by mouth.     No current facility-administered medications on file prior to visit.    Review of Systems  Constitutional: Positive for fatigue. Negative for appetite change and unexpected weight change.    HENT: Negative for congestion and sinus pressure.   Respiratory: Positive for cough. Negative for chest tightness and shortness of breath.   Cardiovascular: Negative for chest pain, palpitations and leg swelling.  Gastrointestinal: Negative for nausea, vomiting, abdominal pain and diarrhea.  Genitourinary: Negative for dysuria and difficulty urinating.  Neurological: Negative for dizziness, light-headedness and headaches.  Psychiatric/Behavioral: Negative for dysphoric mood and agitation.       Objective:    Physical Exam  Constitutional: She appears well-developed and well-nourished. No distress.  HENT:  Nose: Nose normal.  Mouth/Throat: Oropharynx is clear and moist.  Neck: Neck supple. No thyromegaly present.  Cardiovascular: Normal rate and regular rhythm.   Pulmonary/Chest: Breath sounds normal. No respiratory distress. She has no wheezes.  Abdominal: Soft. Bowel sounds are normal. There is no tenderness.  Musculoskeletal: She exhibits no edema or tenderness.  Lymphadenopathy:    She has no cervical adenopathy.  Skin: No rash noted. No erythema.  Psychiatric: She has a normal mood and affect. Her behavior is normal.    BP 120/50 mmHg  Pulse 84  Temp(Src) 97.9 F (36.6 C) (Oral)  Ht 5' 4"  (1.626 m)  Wt 137 lb 4 oz (62.256 kg)  BMI 23.55 kg/m2  SpO2 97% Wt Readings from Last 3 Encounters:  09/15/14  137 lb 4 oz (62.256 kg)  09/11/14 136 lb 11 oz (62.001 kg)  05/16/14 143 lb 8 oz (65.091 kg)     Lab Results  Component Value Date   WBC 8.8 03/18/2014   HGB 12.5 03/18/2014   HCT 38 03/18/2014   PLT 312 03/18/2014   GLUCOSE 121* 03/03/2014   CHOL 155 03/03/2014   TRIG 117.0 03/03/2014   HDL 36.20* 03/03/2014   LDLCALC 95 03/03/2014   ALT 22 03/18/2014   AST 18 03/18/2014   NA 139 03/18/2014   K 3.7 03/18/2014   CL 103 03/03/2014   CREATININE 0.9 03/18/2014   BUN 16 03/18/2014   CO2 30 03/03/2014   TSH 0.42 01/31/2013   INR 1.0 03/18/2014   HGBA1C 6.3  03/03/2014       Assessment & Plan:   Problem List Items Addressed This Visit    Cough    Treat allergies.  Follow.  Being treated for lung cancer.       GERD (gastroesophageal reflux disease)    Symptoms controlled.  Follow.        Hypercholesterolemia    Low cholesterol diet and exercise.  Follow lipid panel.        Hyperglycemia    Low carb diet and exercise.  Follow met b and a1c.        Hypertension - Primary    Blood pressure under good control.  Same medication regimen.  Follow pressures.  Follow metabolic panel.       Lung cancer    Being treated by Dr Oliva Bustard.  Tolerating treatments.  Some fatigue, but overall doing well.  Follow.            Heather Pheasant, MD

## 2014-09-15 NOTE — Progress Notes (Signed)
Pre visit review using our clinic review tool, if applicable. No additional management support is needed unless otherwise documented below in the visit note. 

## 2014-09-20 ENCOUNTER — Encounter: Payer: Self-pay | Admitting: Internal Medicine

## 2014-09-20 NOTE — Assessment & Plan Note (Signed)
Treat allergies.  Follow.  Being treated for lung cancer.

## 2014-09-20 NOTE — Assessment & Plan Note (Signed)
Low cholesterol diet and exercise.  Follow lipid panel.   

## 2014-09-20 NOTE — Assessment & Plan Note (Signed)
Blood pressure under good control.  Same medication regimen.  Follow pressures.  Follow metabolic panel.  

## 2014-09-20 NOTE — Assessment & Plan Note (Signed)
Low carb diet and exercise.  Follow met b and a1c.   

## 2014-09-20 NOTE — Assessment & Plan Note (Signed)
Symptoms controlled.  Follow.

## 2014-09-20 NOTE — Assessment & Plan Note (Signed)
Being treated by Dr Oliva Bustard.  Tolerating treatments.  Some fatigue, but overall doing well.  Follow.

## 2014-09-26 ENCOUNTER — Other Ambulatory Visit: Payer: Self-pay

## 2014-09-26 MED ORDER — OLMESARTAN MEDOXOMIL-HCTZ 20-12.5 MG PO TABS: 1.0000 | ORAL_TABLET | Freq: Every day | ORAL | Status: DC

## 2014-10-04 NOTE — Op Note (Signed)
PATIENT NAME:  Heather Cooper, Heather Cooper MR#:  [id number] DATE OF BIRTH:  1930-08-25  DATE OF PROCEDURE:  05/07/2014  PREOPERATIVE DIAGNOSES: 1.  Complication of vascular device.  2.  Lung carcinoma.   POSTOPERATIVE DIAGNOSES:  1.  Complication of vascular device.  2.  Lung carcinoma.   PROCEDURES PERFORMED: 1.  Insertion of left arm PICC line.  2.  Removal of right internal jugular Infuse-a-Port.   SURGEON: Katha Cabal, MD.   SEDATION: Versed plus fentanyl. Continuous ECG, pulse oximetry, and cardiopulmonary monitoring is performed throughout the entire procedure by the interventional radiology nurse. Total sedation time was 1 hour, 30 minutes.   ACCESS: Left brachial approach for the PICC line.   CONTRAST USED: Isovue 5 mL.   FLUOROSCOPY TIME: 3.2 minutes.   INDICATIONS: Heather Cooper was seen at the [facility] on Monday and found to have worsening erythema surrounding her port tracking along the path of the catheter. The risks and benefits for removal were reviewed. All questions answered. The patient agrees to proceed.   DESCRIPTION OF PROCEDURE: The patient is taken to special procedures and placed in the supine position. After adequate sedation is achieved, she is positioned supine with her left arm extended palm upward. The left arm was prepped and draped in sterile fashion. Appropriate timeout is called.   Ultrasound is placed in a sterile sleeve and the brachial veins are identified. They are echolucent and compressible indicating patency, and an image is recorded for the permanent record. Under real-time visualization after 1% lidocaine has been infiltrated in the soft tissues, access to the brachial vein is obtained with a micropuncture needle, microwire followed by MicroSheath. The wire is then advanced under fluoroscopic guidance into the central venous system. The catheter is then advanced over the wire. Unfortunately, the catheter continues to track up the innominate  toward the right subclavian. Ultimately, a single injection of contrast with the catheter just to the left of midline is performed, and this demonstrates that the left innominate and superior vena cava are widely patent. The wire is then advanced into the atrium and the catheter is advanced over the wire. A single-lumen 5 French PICC line is then secured at 38 cm via the brachial approach. It aspirates and flushes easily.   Attention is then turned to the port itself. Lidocaine 1% is infiltrated after the right chest wall is prepped and draped. Appropriate timeout is called. This is a separate procedure. After 1% lidocaine is infiltrated in the soft tissues, the previous incisional scar above the hub is re-incised with an 11 blade. The port is exposed. It is then removed without difficulty. Pressure is held at the base of the neck. Interrupted 3-0 Vicryl are used to close the subcutaneous tissues and 4-0 Monocryl closes the skin. Dermabond is applied. The catheter is then cultured. A segment from the tip and a segment closer to the hub are both placed in the specimen container for microbiology analysis.    ____________________________ Katha Cabal, MD ggs:at D: 05/07/2014 14:43:51 ET T: 05/07/2014 19:10:45 ET JOB#: [id number]  cc: Katha Cabal, MD, <Dictator> Martie Lee. Oliva Bustard, MD Katha Cabal MD ELECTRONICALLY SIGNED 05/13/2014 13:01

## 2014-10-04 NOTE — Op Note (Signed)
PATIENT NAME:  Heather Cooper, Heather Cooper MR#:  [id number] DATE OF BIRTH:  1930-09-05  DATE OF PROCEDURE:  04/08/2014  PREOPERATIVE DIAGNOSIS: Lung carcinoma.   POSTOPERATIVE DIAGNOSIS: Lung carcinoma.  PROCEDURE PERFORMED: Insertion of right internal jugular Infuse-a-Port with ultrasound and fluoroscopic guidance.   SURGEON: Katha Cabal, M.D.   SEDATION: Versed 3 mg plus fentanyl 150 mcg administered IV. Continuous ECG, pulse oximetry, and cardiopulmonary monitoring was performed throughout the entire procedure by the interventional radiology nurse. Total sedation time was 50 minutes.   ACCESS: Right IJ.   CONTRAST USED: None.   FLUOROSCOPY TIME: 0.6 minutes.   INDICATIONS: Ms. Blume is an 81 year old woman found to have lung carcinoma and will undergo treatment. She, therefore, requires appropriate IV access. Risks and benefits were reviewed. The patient has agreed to [facility] placement.  DESCRIPTION OF PROCEDURE: The patient is taken to special procedures and placed in the supine position. After adequate sedation is achieved, the right neck and chest wall are prepped and draped in a sterile fashion. Appropriate timeout is called.   Lidocaine 1% lidocaine is infiltrated in the soft tissues at the base of the neck, as well as on the chest wall. Ultrasound is placed in a sterile sleeve. Jugular vein is identified. It is echolucent and compressible indicating patency. Image is recorded for the permanent record. Under real-time visualization, the jugular vein is accessed with a Seldinger needle. J-wire is advanced under fluoroscopy.   A counterincision is made at the wire insertion site and a small pocket fashioned with a hemostat.   Two fingerbreadths below the clavicle a linear incision is made and a small pocket is fashioned with both sharp and blunt dissection. It is tested for size with the port. Subsequently, the catheter portion is pulled subcutaneously from the port pocket to  the neck counterincision. Dilator and peel-away sheath are inserted over the wire, and the wire and dilator are removed. The catheter is then inserted into the venous system and the peel-away sheath is removed. Under fluoroscopic guidance, the catheter is positioned at the atriocaval junction. It is transected and connected to the port, and the port is slipped into the pocket. The port is then accessed percutaneously with a Huber needle. It aspirates easily and flushes well. Pocket is then closed using interrupted 3-0 Vicryl, followed by 4-0 Monocryl subcuticular. Neck counterincision is closed with 4-0 Monocryl subcuticular. Dermabond is applied to both sides. The patient tolerated the procedure well and there were no immediate complications. Sponge and needle counts are correct.  ____________________________ Katha Cabal, MD ggs:am D: 04/08/2014 15:09:44 ET T: 04/09/2014 01:33:26 ET JOB#: [id number]  cc: Katha Cabal, MD, <Dictator> [name], MD Martie Lee. Oliva Bustard, MD  Katha Cabal MD ELECTRONICALLY SIGNED 04/16/2014 11:46

## 2014-10-04 NOTE — Consult Note (Signed)
Reason for Visit: This 81 year old Female patient presents to the clinic for initial evaluation of  lung cancer .   Referred by Dr. Oliva Bustard.  Diagnosis:  Chief Complaint/Diagnosis   patient is an 81 year old female with probable stage IV (T4 N2 M1) adenocarcinoma of the right lung with intrathoracic lower lobe metastasis as well as a T4 lung lesion with direct invasion of the mediastinum and pulmonary artery invasion.  Pathology Report pathology report reviewed   Imaging Report CT scans and PET CT scans reviewed   Referral Report clinical notes reviewed   Planned Treatment Regimen palliative radiation therapy to chest   HPI   patient is a pleasant 81 year old female who presented with slowly increasing shortness of breath and dyspnea on exertion over the past month. She eventually saw her nurse practitioner and chest x-ray revealed abnormal lung mass of the right hilum. CT scan confirmed right hilar mass with now running in the right mainstem bronchus. Patient's had a slight weight loss no hemoptysis. She has a 30-pack-year smoking history little quit smoking 10 years prior. She went on to have a bronchoscopy which was positive for adenocarcinoma. PET CT scan showed hypermetabolic right upper lobe paramediastinal mass with right and left bilateral lower lobe pulmonary nodules which were also hypermetabolic. No sign of metastatic disease outside of the thorax was noted.she is seen today for consideration of palliative ration therapy to her chest since she does have pulmonary artery involvement and direct extension to the mediastinum. Would also like to try to prevent atelectasis right upper right lung or hemoptysis. She otherwise is doing fairly well states she really is in no pain or discomfort at this time.  Past Hx:    hypertension:    HTN:    Hysterectomy - Partial:    Cholecystectomy:    Gall Bladder:    Hysterectomy - Partial:   Past, Family and Social History:  Past Medical  History positive   Cardiovascular hypertension   Past Surgical History cholecystectomy; partial hysterectomy   Family History noncontributory   Social History positive   Social History Comments 30 pack year smoking history quit smoking 10 years prior. No EtOH abuse history   Additional Past Medical and Surgical History accompanied by husband and son today   Allergies:   NKDA: None  Home Meds:  Home Medications: Medication Instructions Status  Tessalon Perles 100 mg oral capsule 1 cap(s) orally 2 times a day Active  Flonase 50 mcg/inh nasal spray 2 spray(s) nasal once a day, As Needed seasonal allergies. Active  aspirin 81 mg oral tablet 1 tab(s) orally once a day Active  cholecalciferol 400 intl units oral tablet 1 tab(s) orally once a day Active  Xanax 0.25 mg oral tablet 1 tab(s) orally every 8 hours, As Needed - for Anxiety, Nervousness Active  amLODIPine 10 mg oral tablet 1 tab(s) orally once a day Active  nystatin topical 100000 units/g topical cream Apply topically to affected area 2 times a day, As Needed rasah at navel Active  hydrochlorothiazide-olmesartan 12.5 mg-20 mg oral tablet 1 tab(s) orally once a day Active  pantoprazole 40 mg oral granule, enteric coated 1 each orally once a day Active   Review of Systems:  General negative   Performance Status (ECOG) 0   Skin negative   Breast negative   Ophthalmologic negative   ENMT negative   Respiratory and Thorax negative   Cardiovascular negative   Gastrointestinal negative   Genitourinary negative   Musculoskeletal negative  Neurological negative   Psychiatric negative   Hematology/Lymphatics negative   Endocrine negative   Allergic/Immunologic negative   Review of Systems   denies any weight loss, fatigue, weakness, fever, chills or night sweats. Patient denies any loss of vision, blurred vision. Patient denies any ringing  of the ears or hearing loss. No irregular heartbeat. Patient denies  heart murmur or history of fainting. Patient denies any chest pain or pain radiating to her upper extremities. Patient denies any shortness of breath, difficulty breathing at night, cough or hemoptysis. Patient denies any swelling in the lower legs. Patient denies any nausea vomiting, vomiting of blood, or coffee ground material in the vomitus. Patient denies any stomach pain. Patient states has had normal bowel movements no significant constipation or diarrhea. Patient denies any dysuria, hematuria or significant nocturia. Patient denies any problems walking, swelling in the joints or loss of balance. Patient denies any skin changes, loss of hair or loss of weight. Patient denies any excessive worrying or anxiety or significant depression. Patient denies any problems with insomnia. Patient denies excessive thirst, polyuria, polydipsia. Patient denies any swollen glands, patient denies easy bruising or easy bleeding. Patient denies any recent infections, allergies or URI. Patient "s visual fields have not changed significantly in recent time.   Nursing Notes:  Nursing Vital Signs and Chemo Nursing Nursing Notes: *CC Vital Signs Flowsheet:   26-Oct-15 10:55  Temp Temperature 97.8  Pulse Pulse 90  Respirations Respirations 20  SBP SBP 150  DBP DBP 78  Pain Scale (0-10)  0  Current Weight (kg) (kg) 67.2  Height (cm) centimeters 163  BSA (m2) 1.7   Physical Exam:  General/Skin/HEENT:  General normal   Skin normal   Eyes normal   ENMT normal   Head and Neck normal   Additional PE well-developed well-nourished female in NAD no cervical or supraclavicular adenopathy is appreciated. Lungs are clear to A&P cardiac examination shows regular rate and rhythm. No venous jugular distention is noted. No evidence of facial plethora is noted. No evidence of collateral vessels on her anterior or posterior chest wall.   Breasts/Resp/CV/GI/GU:  Respiratory and Thorax normal   Cardiovascular normal    Gastrointestinal normal   Genitourinary normal   MS/Neuro/Psych/Lymph:  Musculoskeletal normal   Neurological normal   Lymphatics normal   Other Results:  Radiology Results: CT:    01-Oct-15 13:12, CT Chest With Contrast  CT Chest With Contrast   REASON FOR EXAM:    LABS 1ST LT lung nodule seen on [facility] chest xray   August 2015  COMMENTS:       PROCEDURE: CT  - CT CHEST WITH CONTRAST  - Mar 13 2014  1:12PM     CLINICAL DATA:  Cough for 2 weeks, LEFT lung nodule reported the on  outside chest radiograph, followup    EXAM:  CT CHEST WITH CONTRAST    TECHNIQUE:  Multidetector CT imaging of the chest was performed during  intravenous contrast administration. Sagittal and coronal MPR images  reconstructed from axial data set.    CONTRAST:  75 cc Isovue 300 IV    COMPARISON:  02/04/2013 chest radiograph; no recent chest radiograph  for comparison.    FINDINGS:  Scattered atherosclerotic calcifications aorta and coronary  arteries.    Aorta normal caliber without aneurysm.    Enlarged precarinal lymph node 14 mm short axis image 20.  Minimally enlarged pretracheal lymph node 11 mm short axis image 14.    Minimally prominent heterogeneous  thyroid lobes without dominant  mass.    Mild extrahepatic biliary dilatation post cholecystectomy.    Questionable tiny splenule a splenic hilum.    Medial RIGHT upper lobe mass 4.6 x 3.4 cm image 15 extending 6.3 cm  length.    Mass encompasses and narrows the RIGHT upper lobe bronchus and  causes marked narrowing of the RIGHT upper lobe pulmonary artery  branch, abutting the RIGHT pulmonary artery at the hilum as well.  RIGHT upper lobe mass is contiguous with the superior mediastinum  over a broad surface cannot exclude invasion.    BILATERAL pulmonary nodules compatible with pulmonary metastases,  including an 8 x 7 mm nodule RIGHT lower lobe image 27 and a LEFT  lower lobe mass 19 x 15 mm image 40.    Minimal  subsegmental atelectasis RIGHT upper lobe extending from  mass to RIGHT apex. No acute infiltrate, pleural effusion or  pneumothorax.    No definite osseous metastatic lesions.     IMPRESSION:  Medial RIGHT upper lobe mass 4.6 x 3.4 x 6.3 cm in size question  invading the mediastinum.    Mass encompasses the RIGHT upper lobe bronchus and narrows the RIGHT  upper lobe pulmonary artery, difficult to exclude pulmonary arterial  invasion at hilum.    Mediastinal adenopathy.    BILATERAL pulmonary metastases.    These results will be called to the ordering clinician or  representative by the Radiologist Assistant, and communication  documented in the PACS or zVision Dashboard.    Electronically Signed    By: Lavonia Dana M.D.    On: 03/13/2014 15:20         Verified By: Burnetta Sabin, M.D.,    23-Oct-15 12:13, CT Head WWO Contrast  CT Head WWO Contrast   REASON FOR EXAM:    HA Lung CA  COMMENTS:       PROCEDURE: CT  - CT HEAD W/WO  - Apr 04 2014 12:13PM     CLINICAL DATA:  Lung cancer staging.  Headache    EXAM:  CT HEAD WITHOUT AND WITH CONTRAST    TECHNIQUE:  Contiguous axial images were obtained from the base of the skull  through the vertex without and with intravenous contrast    CONTRAST:  75 mL Isovue 300 IV  COMPARISON:  None.    FINDINGS:  Ventricle size is normal.  Cerebral volume normal for age    Chronic infarct in the head of the caudate on the right and in the  right internal capsule anteriorly. Mild chronic microvascular  ischemic change in the white matter.    Negative for acute infarct.  Negative for hemorrhage or mass.    Normal enhancement following contrast infusion. No enhancing  metastatic deposits.    Calvarium intact.   IMPRESSION:  Chronic ischemic changes.  No acute infarct.    Negative for metastatic disease.      Electronically Signed    By: Franchot Gallo M.D.    On: 04/04/2014 17:16         Verified By: Kandice Moos. Carlis Abbott,  M.D.,  Nuclear Med:    19-Oct-15 14:16, PET/CT Scan Lung Cancer Initial Staging  PET/CT Scan Lung Cancer Initial Staging   REASON FOR EXAM:    Lung CA Initial Stage  COMMENTS:       PROCEDURE: PET - PET/CT INIT STAGING LUNG CA  - Mar 31 2014  2:16PM     CLINICAL DATA:  Initial treatment strategy for lung carcinoma.[name]  Right upper lobe mass on computer tomography    EXAM:  NUCLEAR MEDICINE PET SKULL BASE TO THIGH    TECHNIQUE:  12.0 mCi F-18 FDG was injected intravenously. Full-ring PET imaging  was performed from the skull base to thigh after the radiotracer. CT  data was obtained and used for attenuation correction and anatomic  localization.    FASTING BLOOD GLUCOSE:  Value: 97 mg/dl    COMPARISON:  CT 03/13/2014    FINDINGS:  NECK    No hypermetabolic lymph nodes in the neck.    CHEST    Hypermetabolic right upper lobe paramediastinal mass measures 3.4 x  3.9 cm (image 65, series 4) with SUV max equals 16.5. Hypermetabolic  right lower lobe pulmonary nodule measures 10 mm (image 87) with SUV  max 6.2. Hypermetabolic nodule in the contralateral left lower lobe  measuring 19 mm with SUV max 14.    There is hypermetabolic mediastinal lymph nodes noted in the right  lower paratracheal nodal station, pretracheal, and prevascular nodes  days. Additionallya hypermetabolic left internal mammary lymph node  on image 5. No hypermetabolic supraclavicular lymph nodes. There is  a tiny hypermetabolic nodule within the right lobe of the thyroid  gland.    ABDOMEN/PELVIS    No abnormal hypermetabolic activity within the liver, pancreas,  adrenal glands, or spleen. No hypermetabolic lymph nodes in the  abdomen or pelvis.    SKELETON    No focal hypermetabolic activity to suggest skeletal metastasis.     IMPRESSION:  1. Hypermetabolic right upper lobe paramediastinal mass with  hypermetabolic right and left lower lobe pulmonary nodules. Findings  are consistent with  metastatic disease and potentially small cell  lung cancer.  2. Mediastinal nodal metastasis.  3. No supraclavicular nodal metastasis.  4. No evidence of metastatic disease outside of the thorax.  Electronically Signed    By: Suzy Bouchard M.D.    On: 03/31/2014 15:14         Verified By: Rennis Golden, M.D.,   Relevent Results:   Relevant Scans and Labs CT scans of head and chest as well as PET/CT scan all reviewed   Assessment and Plan: Impression:   stage IV adenocarcinoma of the right upper lobe in 81 year old female for palliative radiation therapy to prevent atelectasisand hemoptysis with tumor invading pulmonary artery as well as mediastinum. Plan:   at this time elected I have recommended a course of palliative radiation therapy to her chest. Would plan on delivering 4000 cGy over 4 weeks in evaluating for response. I will target the main area of the right upper lobe with hypermetabolic activity by PET/CT criteria. I have ordered a CT simulation for later this week. Risks and benefits of treatment including increasing dysphagia secondary to radiation esophagitis, alteration of blood counts, fatigue, skin reaction, all were explained in full to the patient and her family. They all seem to comprehend my treatment plan well. I will also coordinate chemotherapy with medical oncology and discussed the case personally with medical oncology.  I would like to take this opportunity for allowing me to participate in the care of your patient..  Fax to Physician:  Physicians To Recieve Fax: [name], MD - [phone].  Electronic Signatures: Armstead Peaks (MD)  (Signed 26-Oct-15 14:22)  Authored: HPI, Diagnosis, Past Hx, PFSH, Allergies, Home Meds, ROS, Nursing Notes, Physical Exam, Other Results, Relevent Results, Encounter Assessment and Plan, Fax to Physician   Last Updated: 26-Oct-15 14:22 by Armstead Peaks (MD)

## 2014-10-07 ENCOUNTER — Ambulatory Visit
Admit: 2014-10-07 | Disposition: A | Discharge status: Home / Self Care | Payer: Self-pay | Attending: Oncology | Admitting: Oncology

## 2014-10-09 LAB — COMPREHENSIVE METABOLIC PANEL
ANION GAP: 7 (ref 7–16)
Albumin: 3.6 g/dL
Alkaline Phosphatase: 70 U/L
BUN: 14 mg/dL
Bilirubin,Total: 1 mg/dL
CALCIUM: 9.1 mg/dL
CHLORIDE: 98 mmol/L — AB
CO2: 25 mmol/L
Creatinine: 0.7 mg/dL
EGFR (Non-African Amer.): >60
Glucose: 161 mg/dL — ABNORMAL HIGH
POTASSIUM: 4.1 mmol/L
SGOT(AST): 16 U/L
SGPT (ALT): 13 U/L — ABNORMAL LOW
Sodium: 130 mmol/L — ABNORMAL LOW
Total Protein: 7.3 g/dL

## 2014-10-09 LAB — CBC CANCER CENTER
BASOS ABS: 0 x10 3/mm (ref 0.0–0.1)
Basophil %: 0.2 %
EOS ABS: 0.1 x10 3/mm (ref 0.0–0.7)
Eosinophil %: 1.2 %
HCT: 30.7 % — AB (ref 35.0–47.0)
HGB: 10.5 g/dL — AB (ref 12.0–16.0)
Lymphocyte #: 0.5 x10 3/mm — ABNORMAL LOW (ref 1.0–3.6)
Lymphocyte %: 4.8 %
MCH: 28.9 pg (ref 26.0–34.0)
MCHC: 34.1 g/dL (ref 32.0–36.0)
MCV: 85 fL (ref 80–100)
MONOS PCT: 6.8 %
Monocyte #: 0.8 x10 3/mm (ref 0.2–0.9)
NEUTROS PCT: 87 %
Neutrophil #: 9.7 x10 3/mm — ABNORMAL HIGH (ref 1.4–6.5)
Platelet: 218 x10 3/mm (ref 150–440)
RBC: 3.63 10*6/uL — ABNORMAL LOW (ref 3.80–5.20)
RDW: 14.5 % (ref 11.5–14.5)
WBC: 11.1 x10 3/mm — AB (ref 3.6–11.0)

## 2014-10-09 LAB — MAGNESIUM: Magnesium: 2 mg/dL

## 2014-10-13 ENCOUNTER — Telehealth: Payer: Self-pay | Admitting: *Deleted

## 2014-10-13 MED ORDER — AZITHROMYCIN 500 MG PO TABS: 500.0000 mg | ORAL_TABLET | Freq: Every day | ORAL | Status: DC

## 2014-10-13 NOTE — Telephone Encounter (Signed)
Was in a [place]PAK last eweek, Started running temp 100.6 last night at [facility] and took tylenol, this morning down to 100 and now 99.7. Any orders?

## 2014-10-13 NOTE — Telephone Encounter (Signed)
Informed patient that prescription was sent ot pharmacy.

## 2014-10-23 ENCOUNTER — Inpatient Hospital Stay: Payer: Medicare Other | Attending: Oncology

## 2014-10-23 ENCOUNTER — Other Ambulatory Visit: Payer: Self-pay | Admitting: Oncology

## 2014-10-23 ENCOUNTER — Encounter (INDEPENDENT_AMBULATORY_CARE_PROVIDER_SITE_OTHER): Payer: Self-pay

## 2014-10-23 VITALS — BP 104/66 | HR 86 | Resp 20 | Ht 64.17 in | Wt 137.6 lb

## 2014-10-23 DIAGNOSIS — E079 Disorder of thyroid, unspecified: Secondary | ICD-10-CM | POA: Insufficient documentation

## 2014-10-23 DIAGNOSIS — I714 Abdominal aortic aneurysm, without rupture: Secondary | ICD-10-CM | POA: Diagnosis not present

## 2014-10-23 DIAGNOSIS — I7090 Unspecified atherosclerosis: Secondary | ICD-10-CM | POA: Insufficient documentation

## 2014-10-23 DIAGNOSIS — C3411 Malignant neoplasm of upper lobe, right bronchus or lung: Secondary | ICD-10-CM | POA: Diagnosis not present

## 2014-10-23 DIAGNOSIS — Z7982 Long term (current) use of aspirin: Secondary | ICD-10-CM | POA: Insufficient documentation

## 2014-10-23 DIAGNOSIS — Z87891 Personal history of nicotine dependence: Secondary | ICD-10-CM | POA: Insufficient documentation

## 2014-10-23 DIAGNOSIS — M810 Age-related osteoporosis without current pathological fracture: Secondary | ICD-10-CM | POA: Insufficient documentation

## 2014-10-23 DIAGNOSIS — Z5111 Encounter for antineoplastic chemotherapy: Secondary | ICD-10-CM | POA: Insufficient documentation

## 2014-10-23 DIAGNOSIS — E780 Pure hypercholesterolemia: Secondary | ICD-10-CM | POA: Diagnosis not present

## 2014-10-23 DIAGNOSIS — C781 Secondary malignant neoplasm of mediastinum: Secondary | ICD-10-CM | POA: Insufficient documentation

## 2014-10-23 DIAGNOSIS — C7989 Secondary malignant neoplasm of other specified sites: Secondary | ICD-10-CM | POA: Insufficient documentation

## 2014-10-23 DIAGNOSIS — Z7952 Long term (current) use of systemic steroids: Secondary | ICD-10-CM | POA: Insufficient documentation

## 2014-10-23 DIAGNOSIS — K5730 Diverticulosis of large intestine without perforation or abscess without bleeding: Secondary | ICD-10-CM | POA: Diagnosis not present

## 2014-10-23 DIAGNOSIS — I10 Essential (primary) hypertension: Secondary | ICD-10-CM | POA: Diagnosis not present

## 2014-10-23 DIAGNOSIS — C3490 Malignant neoplasm of unspecified part of unspecified bronchus or lung: Secondary | ICD-10-CM

## 2014-10-23 DIAGNOSIS — Z79899 Other long term (current) drug therapy: Secondary | ICD-10-CM | POA: Insufficient documentation

## 2014-10-23 MED ORDER — HEPARIN SOD (PORK) LOCK FLUSH 100 UNIT/ML IV SOLN: 500.0000 [IU] | Freq: Once | INTRAVENOUS | Status: DC | PRN

## 2014-10-23 MED ORDER — SODIUM CHLORIDE 0.9 % IV SOLN
200.0000 mg | Freq: Once | INTRAVENOUS | Status: AC
  Administered 2014-10-23: 200 mg via INTRAVENOUS
  Filled 2014-10-23: qty 20

## 2014-10-23 MED ORDER — SODIUM CHLORIDE 0.9 % IV SOLN
Freq: Once | INTRAVENOUS | Status: AC
  Administered 2014-10-23: 11:00:00 via INTRAVENOUS
  Filled 2014-10-23: qty 250

## 2014-10-23 MED ORDER — SODIUM CHLORIDE 0.9 % IJ SOLN
10.0000 mL | INTRAMUSCULAR | Status: DC | PRN
  Filled 2014-10-23: qty 10

## 2014-11-05 ENCOUNTER — Other Ambulatory Visit: Payer: Self-pay | Admitting: *Deleted

## 2014-11-05 MED ORDER — OLMESARTAN MEDOXOMIL-HCTZ 20-12.5 MG PO TABS: 1.0000 | ORAL_TABLET | Freq: Every day | ORAL | Status: DC

## 2014-11-06 ENCOUNTER — Inpatient Hospital Stay: Payer: Medicare Other

## 2014-11-06 ENCOUNTER — Inpatient Hospital Stay (HOSPITAL_BASED_OUTPATIENT_CLINIC_OR_DEPARTMENT_OTHER): Payer: Medicare Other | Admitting: Oncology

## 2014-11-06 ENCOUNTER — Encounter: Payer: Self-pay | Admitting: Oncology

## 2014-11-06 VITALS — BP 141/85 | HR 124 | Temp 96.0°F | Wt 135.4 lb

## 2014-11-06 DIAGNOSIS — C781 Secondary malignant neoplasm of mediastinum: Secondary | ICD-10-CM | POA: Diagnosis not present

## 2014-11-06 DIAGNOSIS — M810 Age-related osteoporosis without current pathological fracture: Secondary | ICD-10-CM

## 2014-11-06 DIAGNOSIS — Z9221 Personal history of antineoplastic chemotherapy: Secondary | ICD-10-CM

## 2014-11-06 DIAGNOSIS — C3411 Malignant neoplasm of upper lobe, right bronchus or lung: Secondary | ICD-10-CM | POA: Diagnosis not present

## 2014-11-06 DIAGNOSIS — Z79899 Other long term (current) drug therapy: Secondary | ICD-10-CM

## 2014-11-06 DIAGNOSIS — Z87891 Personal history of nicotine dependence: Secondary | ICD-10-CM

## 2014-11-06 DIAGNOSIS — Z923 Personal history of irradiation: Secondary | ICD-10-CM | POA: Diagnosis not present

## 2014-11-06 DIAGNOSIS — C3491 Malignant neoplasm of unspecified part of right bronchus or lung: Secondary | ICD-10-CM

## 2014-11-06 DIAGNOSIS — C7989 Secondary malignant neoplasm of other specified sites: Secondary | ICD-10-CM | POA: Diagnosis not present

## 2014-11-06 DIAGNOSIS — E780 Pure hypercholesterolemia: Secondary | ICD-10-CM

## 2014-11-06 DIAGNOSIS — Z5111 Encounter for antineoplastic chemotherapy: Secondary | ICD-10-CM | POA: Diagnosis not present

## 2014-11-06 DIAGNOSIS — C3490 Malignant neoplasm of unspecified part of unspecified bronchus or lung: Secondary | ICD-10-CM

## 2014-11-06 DIAGNOSIS — I10 Essential (primary) hypertension: Secondary | ICD-10-CM

## 2014-11-06 LAB — CBC WITH DIFFERENTIAL/PLATELET
BASOS ABS: 0 10*3/uL (ref 0–0.1)
Basophils Relative: 0 %
Eosinophils Absolute: 0.4 10*3/uL (ref 0–0.7)
Eosinophils Relative: 6 %
HEMATOCRIT: 34 % — AB (ref 35.0–47.0)
Hemoglobin: 11.3 g/dL — ABNORMAL LOW (ref 12.0–16.0)
Lymphocytes Relative: 12 %
Lymphs Abs: 1 10*3/uL (ref 1.0–3.6)
MCH: 27.8 pg (ref 26.0–34.0)
MCHC: 33.1 g/dL (ref 32.0–36.0)
MCV: 84 fL (ref 80.0–100.0)
MONOS PCT: 7 %
Monocytes Absolute: 0.6 10*3/uL (ref 0.2–0.9)
NEUTROS ABS: 5.9 10*3/uL (ref 1.4–6.5)
NEUTROS PCT: 75 %
PLATELETS: 285 10*3/uL (ref 150–440)
RBC: 4.05 MIL/uL (ref 3.80–5.20)
RDW: 15.5 % — ABNORMAL HIGH (ref 11.5–14.5)
WBC: 7.9 10*3/uL (ref 3.6–11.0)

## 2014-11-06 LAB — COMPREHENSIVE METABOLIC PANEL
ALT: 11 U/L — ABNORMAL LOW (ref 14–54)
AST: 18 U/L (ref 15–41)
Albumin: 3.6 g/dL (ref 3.5–5.0)
Alkaline Phosphatase: 93 U/L (ref 38–126)
Anion gap: 7 (ref 5–15)
BUN: 17 mg/dL (ref 6–20)
CO2: 26 mmol/L (ref 22–32)
Calcium: 9.4 mg/dL (ref 8.9–10.3)
Chloride: 102 mmol/L (ref 101–111)
Creatinine, Ser: 0.75 mg/dL (ref 0.44–1.00)
GFR calc Af Amer: >60 mL/min (ref >60)
GFR calc non Af Amer: >60 mL/min (ref >60)
Glucose, Bld: 170 mg/dL — ABNORMAL HIGH (ref 65–99)
Potassium: 3.5 mmol/L (ref 3.5–5.1)
Sodium: 135 mmol/L (ref 135–145)
Total Bilirubin: 0.4 mg/dL (ref 0.3–1.2)
Total Protein: 7.2 g/dL (ref 6.5–8.1)

## 2014-11-06 MED ORDER — SODIUM CHLORIDE 0.9 % IV SOLN
200.0000 mg | Freq: Once | INTRAVENOUS | Status: AC
  Administered 2014-11-06: 200 mg via INTRAVENOUS
  Filled 2014-11-06: qty 20

## 2014-11-06 MED ORDER — HEPARIN SOD (PORK) LOCK FLUSH 100 UNIT/ML IV SOLN: 500.0000 [IU] | Freq: Once | INTRAVENOUS | Status: DC | PRN

## 2014-11-06 MED ORDER — SODIUM CHLORIDE 0.9 % IV SOLN
Freq: Once | INTRAVENOUS | Status: AC
  Administered 2014-11-06: 10:00:00 via INTRAVENOUS
  Filled 2014-11-06: qty 250

## 2014-11-06 MED ORDER — SODIUM CHLORIDE 0.9 % IJ SOLN
10.0000 mL | INTRAMUSCULAR | Status: DC | PRN
  Filled 2014-11-06: qty 10

## 2014-11-06 NOTE — Progress Notes (Signed)
Met with patient and husband today in exam room. Patient returned completed survey she completed as required for her participation in [place] [id number].  Patient was given $100 gift card provided by Orson Ape for completion of survey.  I thanked patient for her time and participation.  I spent approximately 10 minutes with patient.

## 2014-11-06 NOTE — Progress Notes (Signed)
[place] @ [facility] Telephone:(336) [phone]  Fax:(336) [place]: [birth year]  MR#: [id number]  VQM#:[id number]  Patient Care Team: [name], MD as PCP - General (Internal Medicine)  CHIEF COMPLAINT:  Chief Complaint  Patient presents with  . Follow-up     No history exists.    Oncology Flowsheet 10/23/2014  nivolumab (OPDIVO) IV 200 mg  patient is an 81 year old female with probable stage IV (T4 N2 M1) adenocarcinoma of the right lung with intrathoracic lower lobe metastasis as well as a T4 lung lesion with direct invasion of the mediastinum and pulmonary artery invasion.stage IV tissue  is insufficient for EGFR and  ALK MUTATION Guident Blood days is not positivefor any EGFR oor ALK mutation  2.  Starting radiation and chemotherapy from April 14, 2014 Patient was started on carboplatinum andTaxol Herve were developed an allergic reaction to Taxol so would be changed to Abraxane 3.patient has finished 6 cycles of weekly chemotherapy with carboplatinum  and radiation therapy(May 28, 2014) 4.started on  NIVOLULAMAB because of persistent disease July 02, 2014  INTERVAL HISTORY: The 32-year-old lady with carcinoma prolonged stage IV disease came today for the follow-up.  Prednisone is now every other day.  Appetite is improving.  No nausea no vomiting no diarrhea.  Patient had a CT scan / pet scan done which shows excellent result.  Good response to chemotherapy.  Here for further evaluation discussed the result and further planning of treatment  REVIEW OF SYSTEMS:  Gen. status: Patient is feeling better.  No chills.  No fever.  Appetite is improving. HEENT: No soreness in the mouth no difficulty swallowing GI: No nausea no vomiting no diarrhea GU: No dysuria hematuria Cardiac: No chest pain.  No palpitation. Abdomen: No nausea no vomiting.  No rectal bleeding Neurological system: No headache no dizziness no tingling numbness Lower extremity no  edema Skin: No rash  As per HPI. Otherwise, a complete review of systems is negatve.  PAST MEDICAL HISTORY: Past Medical History  Diagnosis Date  . Hypertension   . Hypercholesterolemia   . Osteoporosis   . Hypercalcemia     familial hypocalciuric hypercalcemia  . Chicken pox   . Thyroid disease   . Lung cancer     PAST SURGICAL HISTORY: Past Surgical History  Procedure Laterality Date  . Transvaginal hysterectomy  04/18/05    with anterior colporrhaphy  . Cholecystectomy    . Abdominal hysterectomy      partial    FAMILY HISTORY Family History  Problem Relation Age of Onset  . Stroke Mother   . Hypertension Mother   . Prostate cancer Father   . Cancer Father     prostate  . Heart disease Brother     s/p CABG  . Colon cancer Neg Hx        ADVANCED DIRECTIVES: Patient does have advanced healthcare directive   HEALTH MAINTENANCE: History  Substance Use Topics  . Smoking status: Former Smoker    Quit date: 06/13/1997  . Smokeless tobacco: Never Used  . Alcohol Use: No   :  Allergies  Allergen Reactions  . Paclitaxel Other (See Comments)    Chest tightness    Current Outpatient Prescriptions  Medication Sig Dispense Refill  . amLODipine (NORVASC) 10 MG tablet Take 1 tablet (10 mg total) by mouth daily. 30 tablet 5  . Calcium Carbonate (CALTRATE 600 PO) Take 1 tablet by mouth daily.    . cholecalciferol (VITAMIN D) 400  UNITS TABS Take 400 Units by mouth daily.    [name] olmesartan-hydrochlorothiazide (BENICAR HCT) 20-12.5 MG per tablet Take 1 tablet by mouth daily. 30 tablet 5  . pantoprazole (PROTONIX) 40 MG tablet Take 1 tablet (40 mg total) by mouth daily. 30 tablet 5  . PREDNISONE, PAK, PO Take by mouth.    [name] aspirin 81 MG tablet Take 81 mg by mouth daily.    [name] azithromycin (ZITHROMAX) 500 MG tablet Take 1 tablet (500 mg total) by mouth daily. (Patient not taking: Reported on 11/06/2014) 4 tablet 0  . dextromethorphan (DELSYM) 30 MG/5ML liquid Take 30 mg  by mouth every 12 (twelve) hours as needed for cough.    . fluticasone (FLONASE) 50 MCG/ACT nasal spray Place 2 sprays into both nostrils daily. (Patient not taking: Reported on 11/06/2014) 16 g 5  . nystatin cream (MYCOSTATIN) Apply topically 2 (two) times daily. (Patient not taking: Reported on 11/06/2014) 30 g 0   No current facility-administered medications for this visit.   Facility-Administered Medications Ordered in Other Visits  Medication Dose Route Frequency Provider Last Rate Last Dose  . 0.9 %  sodium chloride infusion   Intravenous Once Forest Gleason, MD      . heparin lock flush 100 unit/mL  500 Units Intracatheter Once PRN Forest Gleason, MD      . nivolumab (OPDIVO) 200 mg in sodium chloride 0.9 % 100 mL chemo infusion  200 mg Intravenous Once Forest Gleason, MD      . sodium chloride 0.9 % injection 10 mL  10 mL Intracatheter PRN Forest Gleason, MD        OBJECTIVE:  Filed Vitals:   11/06/14 0850  BP: 141/85  Pulse: 124  Temp: 96 F (35.6 C)     Body mass index is 23.11 kg/(m^2).    ECOG FS:1 - Symptomatic but completely ambulatory Allergies:  Paclitaxel: Chest Tightness  Significant History/PMH:   hypertension:    HTN:    Hysterectomy - Partial:    Cholecystectomy:    Gall Bladder:    Hysterectomy - Partial:   Preventive Screening:  Has patient had any of the following test? Colonscopy  Mammography (1)   Last Colonoscopy: 2010(1)   Last Mammography: 2015(1)   Smoking History: Smoking History 1(1)Smoked 1 pack a day for 30 yrs. Quit 10 yrs. ago(1)  PFSH: Family History: noncontributory  Social History: negative alcohol, negative tobacco  Additional Past Medical and Surgical History: As mentioned above   PHYSICAL EXAM:  GENERAL:  Well developed, well nourished, sitting comfortably in the exam room in no acute distress. MENTAL STATUS:  Alert and oriented to person, place and time. HEAD:   Normocephalic, atraumatic, face symmetric, no Cushingoid  features. EYES:   Pupils equal round and reactive to light and accomodation.  No conjunctivitis or scleral icterus. ENT:  Oropharynx clear without lesion.  Tongue normal. Mucous membranes moist.  RESPIRATORY:  Clear to auscultation without rales, wheezes or rhonchi. CARDIOVASCULAR:  Regular rate and rhythm without murmur, rub or gallop. BREAST:  Right breast without masses, skin changes or nipple discharge.  Left breast without masses, skin changes or nipple discharge. ABDOMEN:  Soft, non-tender, with active bowel sounds, and no hepatosplenomegaly.  No masses. BACK:  No CVA tenderness.  No tenderness on percussion of the back or rib cage. SKIN:  No rashes, ulcers or lesions. EXTREMITIES: No edema, no skin discoloration or tenderness.  No palpable cords. LYMPH NODES: No palpable cervical, supraclavicular, axillary or inguinal adenopathy  NEUROLOGICAL:  Unremarkable. PSYCH:  Appropriate. LAB RESULTS:  Appointment on 11/06/2014  Component Date Value Ref Range Status  . WBC 11/06/2014 7.9  3.6 - 11.0 K/uL Final  . RBC 11/06/2014 4.05  3.80 - 5.20 MIL/uL Final  . Hemoglobin 11/06/2014 11.3* 12.0 - 16.0 g/dL Final  . HCT 11/06/2014 34.0* 35.0 - 47.0 % Final  . MCV 11/06/2014 84.0  80.0 - 100.0 fL Final  . MCH 11/06/2014 27.8  26.0 - 34.0 pg Final  . MCHC 11/06/2014 33.1  32.0 - 36.0 g/dL Final  . RDW 11/06/2014 15.5* 11.5 - 14.5 % Final  . Platelets 11/06/2014 285  150 - 440 K/uL Final  . Neutrophils Relative % 11/06/2014 75   Final  . Neutro Abs 11/06/2014 5.9  1.4 - 6.5 K/uL Final  . Lymphocytes Relative 11/06/2014 12   Final  . Lymphs Abs 11/06/2014 1.0  1.0 - 3.6 K/uL Final  . Monocytes Relative 11/06/2014 7   Final  . Monocytes Absolute 11/06/2014 0.6  0.2 - 0.9 K/uL Final  . Eosinophils Relative 11/06/2014 6   Final  . Eosinophils Absolute 11/06/2014 0.4  0 - 0.7 K/uL Final  . Basophils Relative 11/06/2014 0   Final  . Basophils Absolute 11/06/2014 0.0  0 - 0.1 K/uL Final  . Sodium  11/06/2014 135  135 - 145 mmol/L Final  . Potassium 11/06/2014 3.5  3.5 - 5.1 mmol/L Final  . Chloride 11/06/2014 102  101 - 111 mmol/L Final  . CO2 11/06/2014 26  22 - 32 mmol/L Final  . Glucose, Bld 11/06/2014 170* 65 - 99 mg/dL Final  . BUN 11/06/2014 17  6 - 20 mg/dL Final  . Creatinine, Ser 11/06/2014 0.75  0.44 - 1.00 mg/dL Final  . Calcium 11/06/2014 9.4  8.9 - 10.3 mg/dL Final  . Total Protein 11/06/2014 7.2  6.5 - 8.1 g/dL Final  . Albumin 11/06/2014 3.6  3.5 - 5.0 g/dL Final  . AST 11/06/2014 18  15 - 41 U/L Final  . ALT 11/06/2014 11* 14 - 54 U/L Final  . Alkaline Phosphatase 11/06/2014 93  38 - 126 U/L Final  . Total Bilirubin 11/06/2014 0.4  0.3 - 1.2 mg/dL Final  . GFR calc non Af Amer 11/06/2014 >60  >60 mL/min Final  . GFR calc Af Amer 11/06/2014 >60  >60 mL/min Final   Comment: (NOTE) The eGFR has been calculated using the CKD EPI equation. This calculation has not been validated in all clinical situations. eGFR's persistently <60 mL/min signify possible Chronic Kidney Disease.   . Anion gap 11/06/2014 7  5 - 15 Final     STUDIES: Nm Pet Image Restag (ps) Skull Base To Thigh  10/07/2014   CLINICAL DATA:  Subsequent treatment strategy for lung cancer.  EXAM: NUCLEAR MEDICINE PET SKULL BASE TO THIGH  TECHNIQUE: 13.0 mCi F-18 FDG was injected intravenously. Full-ring PET imaging was performed from the skull base to thigh after the radiotracer. CT data was obtained and used for attenuation correction and anatomic localization.  FASTING BLOOD GLUCOSE:  Value: 119 mg/dl  COMPARISON:  Multiple exams, including 06/11/2014  FINDINGS: NECK  No hypermetabolic lymph nodes in the neck.  CHEST  The right upper lobe paramediastinal nodule measures 2.0 by 1.8 cm (formerly 2.4 by 2.8 cm) on image 73 of series 3 with maximum regional standard uptake value 3.2 (formerly 6.0).  Right lower paratracheal node anterior to the right mainstem bronchus measuring 6 mm in short axis, with maximum  standard uptake  value 3.2 (formerly 4.8).  Right infrahilar activity adjacent to the pulmonary vein, maximum standard uptake value 5.0, previously not readily apparent.  Faint focal activity along the right hemidiaphragm, maximum standard uptake value 4.6 common no lesion previously visible at this location.  Prior left lower lobe nodule which previously measured 1.6 by 1.2 cm on image 109 of series 4 that prior exam currently measures 1.0 by 0.8 cm and does not have appreciable elevated activity.  Other thoracic findings include extensive atherosclerosis, reduced nodularity in the posterior basal segment right lower lobe (currently 0.9 by 0.4 cm on image 93 series 3, formerly 0.9 by 0.6 cm), and increased indistinct banding and airspace opacity in the right upper lobe which is probably due to radiation pneumonitis/inflammation.  ABDOMEN/PELVIS  No abnormal hypermetabolic activity within the liver, pancreas, adrenal glands, or spleen. No hypermetabolic lymph nodes in the abdomen or pelvis. Other findings include cholecystectomy, atherosclerosis with a fusiform 3.5 cm abdominal aortic aneurysm; the and sigmoid diverticulosis.  SKELETON  Faintly increased activity along the left posterior tenth rib, maximum standard uptake value 3.8, previously no elevated activity in this vicinity. No corresponding CT abnormality.  High activity immediately adjacent to the right fifth rib, maximum standard uptake value 3.2, previously not elevated. No corresponding CT abnormality.  IMPRESSION: 1. In general previously visible pulmonary nodules demonstrate significantly reduced standard uptake value and reduced size. There are some new areas of mildly increased metabolic activity, including along the right inferior pulmonary vein, right hemidiaphragm, right fifth rib, and left posterior tenth rib, but also with no obvious corresponding lesion on the CT data. These findings are of uncertain significance, particularly in light of the  improvement in the other known lesions. Presumably these may be artifactual but merit observation. 2. 3.5 cm infrarenal abdominal aortic aneurysm. 3. Sigmoid diverticulosis.   Electronically Signed   By: Van Clines M.D.   On: 10/07/2014 12:08    ASSESSMENT: 81 year old lady with stage IV non-small cell carcinoma of lung   MEDICAL DECISION MAKING:  PET scan has been reviewed dated April 26 and overall there is improvement. Also this scan has been reviewed independently and reviewed with the patient At present time we will continue only one U Leman every 2 weeks 2 and reevaluate patient in 4 weeks. Continue prednisone every other day  Patient expressed understanding and was in agreement with this plan. She also understands that She can call clinic at any time with any questions, concerns, or complaints.    No matching staging information was found for the patient.  Forest Gleason, MD   11/06/2014 9:28 AM

## 2014-11-06 NOTE — Progress Notes (Signed)
Patient former smoker.  Does have living will.

## 2014-11-17 ENCOUNTER — Other Ambulatory Visit: Payer: Self-pay | Admitting: *Deleted

## 2014-11-17 MED ORDER — PANTOPRAZOLE SODIUM 40 MG PO TBEC: 40.0000 mg | DELAYED_RELEASE_TABLET | Freq: Every day | ORAL | Status: DC

## 2014-11-20 ENCOUNTER — Inpatient Hospital Stay: Payer: Medicare Other | Attending: Oncology

## 2014-11-20 VITALS — BP 107/70 | HR 86 | Temp 95.9°F

## 2014-11-20 DIAGNOSIS — R509 Fever, unspecified: Secondary | ICD-10-CM | POA: Insufficient documentation

## 2014-11-20 DIAGNOSIS — C781 Secondary malignant neoplasm of mediastinum: Secondary | ICD-10-CM | POA: Diagnosis not present

## 2014-11-20 DIAGNOSIS — Z79899 Other long term (current) drug therapy: Secondary | ICD-10-CM | POA: Insufficient documentation

## 2014-11-20 DIAGNOSIS — Z8042 Family history of malignant neoplasm of prostate: Secondary | ICD-10-CM | POA: Insufficient documentation

## 2014-11-20 DIAGNOSIS — Z7952 Long term (current) use of systemic steroids: Secondary | ICD-10-CM | POA: Diagnosis not present

## 2014-11-20 DIAGNOSIS — C7989 Secondary malignant neoplasm of other specified sites: Secondary | ICD-10-CM | POA: Diagnosis not present

## 2014-11-20 DIAGNOSIS — Z7982 Long term (current) use of aspirin: Secondary | ICD-10-CM | POA: Diagnosis not present

## 2014-11-20 DIAGNOSIS — R5383 Other fatigue: Secondary | ICD-10-CM | POA: Diagnosis not present

## 2014-11-20 DIAGNOSIS — R197 Diarrhea, unspecified: Secondary | ICD-10-CM | POA: Insufficient documentation

## 2014-11-20 DIAGNOSIS — Z5111 Encounter for antineoplastic chemotherapy: Secondary | ICD-10-CM | POA: Diagnosis not present

## 2014-11-20 DIAGNOSIS — E876 Hypokalemia: Secondary | ICD-10-CM | POA: Diagnosis not present

## 2014-11-20 DIAGNOSIS — Z87891 Personal history of nicotine dependence: Secondary | ICD-10-CM | POA: Insufficient documentation

## 2014-11-20 DIAGNOSIS — I10 Essential (primary) hypertension: Secondary | ICD-10-CM | POA: Insufficient documentation

## 2014-11-20 DIAGNOSIS — E780 Pure hypercholesterolemia: Secondary | ICD-10-CM | POA: Diagnosis not present

## 2014-11-20 DIAGNOSIS — E860 Dehydration: Secondary | ICD-10-CM | POA: Insufficient documentation

## 2014-11-20 DIAGNOSIS — C3490 Malignant neoplasm of unspecified part of unspecified bronchus or lung: Secondary | ICD-10-CM

## 2014-11-20 DIAGNOSIS — C3411 Malignant neoplasm of upper lobe, right bronchus or lung: Secondary | ICD-10-CM | POA: Insufficient documentation

## 2014-11-20 DIAGNOSIS — R531 Weakness: Secondary | ICD-10-CM | POA: Insufficient documentation

## 2014-11-20 MED ORDER — SODIUM CHLORIDE 0.9 % IV SOLN
Freq: Once | INTRAVENOUS | Status: AC
  Administered 2014-11-20: 09:00:00 via INTRAVENOUS
  Filled 2014-11-20: qty 1000

## 2014-11-20 MED ORDER — SODIUM CHLORIDE 0.9 % IJ SOLN
10.0000 mL | INTRAMUSCULAR | Status: DC | PRN
  Filled 2014-11-20: qty 10

## 2014-11-20 MED ORDER — SODIUM CHLORIDE 0.9 % IV SOLN
200.0000 mg | Freq: Once | INTRAVENOUS | Status: AC
  Administered 2014-11-20: 200 mg via INTRAVENOUS
  Filled 2014-11-20: qty 20

## 2014-12-03 ENCOUNTER — Telehealth: Payer: Self-pay | Admitting: *Deleted

## 2014-12-03 MED ORDER — PREDNISONE 10 MG (21) PO TBPK: 10.0000 mg | ORAL_TABLET | Freq: Every day | ORAL | Status: DC

## 2014-12-03 MED ORDER — PREDNISONE 10 MG PO TABS
10.0000 mg | ORAL_TABLET | Freq: Every day | ORAL | Status: DC
Start: 2014-12-03 — End: 2014-12-18

## 2014-12-03 NOTE — Addendum Note (Signed)
Addended by: Betti Cruz on: 12/03/2014 11:39 AM   Modules accepted: Orders, Medications

## 2014-12-03 NOTE — Telephone Encounter (Signed)
Escribed

## 2014-12-04 ENCOUNTER — Inpatient Hospital Stay: Payer: Medicare Other

## 2014-12-04 ENCOUNTER — Other Ambulatory Visit: Payer: Self-pay | Admitting: Family Medicine

## 2014-12-04 ENCOUNTER — Inpatient Hospital Stay (HOSPITAL_BASED_OUTPATIENT_CLINIC_OR_DEPARTMENT_OTHER): Payer: Medicare Other | Admitting: Oncology

## 2014-12-04 VITALS — BP 111/79 | HR 114 | Temp 98.7°F | Wt 135.4 lb

## 2014-12-04 DIAGNOSIS — C781 Secondary malignant neoplasm of mediastinum: Secondary | ICD-10-CM | POA: Diagnosis not present

## 2014-12-04 DIAGNOSIS — C3490 Malignant neoplasm of unspecified part of unspecified bronchus or lung: Secondary | ICD-10-CM

## 2014-12-04 DIAGNOSIS — R197 Diarrhea, unspecified: Secondary | ICD-10-CM

## 2014-12-04 DIAGNOSIS — R531 Weakness: Secondary | ICD-10-CM

## 2014-12-04 DIAGNOSIS — C7989 Secondary malignant neoplasm of other specified sites: Secondary | ICD-10-CM

## 2014-12-04 DIAGNOSIS — C3411 Malignant neoplasm of upper lobe, right bronchus or lung: Secondary | ICD-10-CM | POA: Diagnosis not present

## 2014-12-04 DIAGNOSIS — E860 Dehydration: Secondary | ICD-10-CM

## 2014-12-04 DIAGNOSIS — Z79899 Other long term (current) drug therapy: Secondary | ICD-10-CM

## 2014-12-04 DIAGNOSIS — R509 Fever, unspecified: Secondary | ICD-10-CM

## 2014-12-04 DIAGNOSIS — E876 Hypokalemia: Secondary | ICD-10-CM

## 2014-12-04 DIAGNOSIS — R5383 Other fatigue: Secondary | ICD-10-CM

## 2014-12-04 DIAGNOSIS — Z7952 Long term (current) use of systemic steroids: Secondary | ICD-10-CM

## 2014-12-04 LAB — COMPREHENSIVE METABOLIC PANEL
ALBUMIN: 3.7 g/dL (ref 3.5–5.0)
ALK PHOS: 85 U/L (ref 38–126)
ALT: 10 U/L — AB (ref 14–54)
AST: 15 U/L (ref 15–41)
Anion gap: 9 (ref 5–15)
BILIRUBIN TOTAL: 1.3 mg/dL — AB (ref 0.3–1.2)
BUN: 11 mg/dL (ref 6–20)
CO2: 24 mmol/L (ref 22–32)
Calcium: 8.8 mg/dL — ABNORMAL LOW (ref 8.9–10.3)
Chloride: 95 mmol/L — ABNORMAL LOW (ref 101–111)
Creatinine, Ser: 0.85 mg/dL (ref 0.44–1.00)
GFR calc Af Amer: >60 mL/min (ref >60)
GFR calc non Af Amer: >60 mL/min (ref >60)
Glucose, Bld: 159 mg/dL — ABNORMAL HIGH (ref 65–99)
Potassium: 3.2 mmol/L — ABNORMAL LOW (ref 3.5–5.1)
Sodium: 128 mmol/L — ABNORMAL LOW (ref 135–145)
Total Protein: 7.5 g/dL (ref 6.5–8.1)

## 2014-12-04 LAB — CBC WITH DIFFERENTIAL/PLATELET
BASOS PCT: 0 %
Basophils Absolute: 0 10*3/uL (ref 0–0.1)
EOS PCT: 1 %
Eosinophils Absolute: 0.1 10*3/uL (ref 0–0.7)
HCT: 35 % (ref 35.0–47.0)
Hemoglobin: 11.5 g/dL — ABNORMAL LOW (ref 12.0–16.0)
Lymphocytes Relative: 8 %
Lymphs Abs: 1 10*3/uL (ref 1.0–3.6)
MCH: 27.8 pg (ref 26.0–34.0)
MCHC: 32.8 g/dL (ref 32.0–36.0)
MCV: 84.7 fL (ref 80.0–100.0)
MONO ABS: 1.5 10*3/uL — AB (ref 0.2–0.9)
MONOS PCT: 13 %
Neutro Abs: 9.1 10*3/uL — ABNORMAL HIGH (ref 1.4–6.5)
Neutrophils Relative %: 78 %
Platelets: 259 10*3/uL (ref 150–440)
RBC: 4.13 MIL/uL (ref 3.80–5.20)
RDW: 16 % — ABNORMAL HIGH (ref 11.5–14.5)
WBC: 11.7 10*3/uL — ABNORMAL HIGH (ref 3.6–11.0)

## 2014-12-04 LAB — MAGNESIUM: Magnesium: 1.5 mg/dL — ABNORMAL LOW (ref 1.7–2.4)

## 2014-12-04 MED ORDER — PREDNISONE 50 MG PO TABS
50.0000 mg | ORAL_TABLET | Freq: Every day | ORAL | Status: DC
Start: 2014-12-04 — End: 2014-12-09

## 2014-12-04 MED ORDER — MAGNESIUM SULFATE 2 GM/50ML IV SOLN: 2.0000 g | Freq: Once | INTRAVENOUS | Status: DC

## 2014-12-04 MED ORDER — SODIUM CHLORIDE 0.9 % IV SOLN
Freq: Once | INTRAVENOUS | Status: AC
  Administered 2014-12-04: 10:00:00 via INTRAVENOUS
  Filled 2014-12-04: qty 1000

## 2014-12-04 MED ORDER — SODIUM CHLORIDE 0.9 % IV SOLN
INTRAVENOUS | Status: DC
  Administered 2014-12-04: 11:00:00 via INTRAVENOUS
  Filled 2014-12-04: qty 1000

## 2014-12-04 MED ORDER — METHYLPREDNISOLONE SODIUM SUCC 125 MG IJ SOLR
60.0000 mg | Freq: Once | INTRAMUSCULAR | Status: AC
  Administered 2014-12-04: 60 mg via INTRAVENOUS
  Filled 2014-12-04: qty 2

## 2014-12-04 NOTE — Progress Notes (Signed)
Patient does have living will.  Former smoker.  Has had diarrhea for past 3 days.  No appetite. Temp last night 101.

## 2014-12-05 ENCOUNTER — Inpatient Hospital Stay: Payer: Medicare Other

## 2014-12-05 ENCOUNTER — Inpatient Hospital Stay (HOSPITAL_BASED_OUTPATIENT_CLINIC_OR_DEPARTMENT_OTHER): Payer: Medicare Other | Admitting: Family Medicine

## 2014-12-05 VITALS — BP 128/74 | HR 84 | Temp 96.7°F | Wt 137.6 lb

## 2014-12-05 DIAGNOSIS — E876 Hypokalemia: Secondary | ICD-10-CM | POA: Diagnosis not present

## 2014-12-05 DIAGNOSIS — C3490 Malignant neoplasm of unspecified part of unspecified bronchus or lung: Secondary | ICD-10-CM

## 2014-12-05 DIAGNOSIS — C781 Secondary malignant neoplasm of mediastinum: Secondary | ICD-10-CM | POA: Diagnosis not present

## 2014-12-05 DIAGNOSIS — C3411 Malignant neoplasm of upper lobe, right bronchus or lung: Secondary | ICD-10-CM

## 2014-12-05 DIAGNOSIS — R509 Fever, unspecified: Secondary | ICD-10-CM

## 2014-12-05 DIAGNOSIS — Z79899 Other long term (current) drug therapy: Secondary | ICD-10-CM

## 2014-12-05 DIAGNOSIS — Z7952 Long term (current) use of systemic steroids: Secondary | ICD-10-CM

## 2014-12-05 DIAGNOSIS — R531 Weakness: Secondary | ICD-10-CM

## 2014-12-05 DIAGNOSIS — R5383 Other fatigue: Secondary | ICD-10-CM

## 2014-12-05 DIAGNOSIS — E860 Dehydration: Secondary | ICD-10-CM

## 2014-12-05 DIAGNOSIS — R197 Diarrhea, unspecified: Secondary | ICD-10-CM

## 2014-12-05 LAB — MAGNESIUM: Magnesium: 2 mg/dL (ref 1.7–2.4)

## 2014-12-05 MED ORDER — SODIUM CHLORIDE 0.9 % IV SOLN
Freq: Once | INTRAVENOUS | Status: AC
  Administered 2014-12-05: 10:00:00 via INTRAVENOUS
  Filled 2014-12-05: qty 1000

## 2014-12-05 NOTE — Progress Notes (Signed)
[place]  Telephone:(336) [phone]  Fax:(336) [phone]     [place] DOB: [birth year]  MR#: [id number]  IRW#:[id number]  Patient Care Team: [name], MD as PCP - General (Internal Medicine)  CHIEF COMPLAINT:  Chief Complaint  Patient presents with  . Follow-up   Patient is here for continued follow-up regarding diarrhea related to Nivolumab.  INTERVAL HISTORY:  Patient is here for continued follow-up regarding diarrhea secondary to Nivolumab treatment. She was seen in the office yesterday by Dr. Oliva Bustard and received IV fluids as well as 60 mg of Solu-Medrol. She reports feeling excellent today, diarrhea has resolved. She reports that IV fluids improved her weakness. And overall is feeling very well  REVIEW OF SYSTEMS:   Review of Systems  Constitutional: Negative for fever, chills, weight loss, malaise/fatigue and diaphoresis.       Weakness has improved since yesterday  HENT: Negative for congestion, ear discharge, ear pain, hearing loss, nosebleeds, sore throat and tinnitus.   Eyes: Negative for blurred vision, double vision, photophobia, pain, discharge and redness.  Respiratory: Negative for cough, hemoptysis, sputum production, shortness of breath, wheezing and stridor.   Cardiovascular: Negative for chest pain, palpitations, orthopnea, claudication, leg swelling and PND.  Gastrointestinal: Negative for heartburn, nausea, vomiting, abdominal pain, diarrhea, constipation, blood in stool and melena.  Genitourinary: Negative.   Musculoskeletal: Negative.   Skin: Negative.   Neurological: Positive for weakness. Negative for dizziness, tingling, focal weakness, seizures and headaches.  Endo/Heme/Allergies: Does not bruise/bleed easily.  Psychiatric/Behavioral: Negative for depression. The patient is not nervous/anxious and does not have insomnia.     As per HPI. Otherwise, a complete review of systems is negatve.  ONCOLOGY HISTORY: 1. Patient is an  81 year old female with probable stage IV (T4 N2 M1) adenocarcinoma of the right lung with intrathoracic lower lobe metastasis as well as a T4 lung lesion with direct invasion of the mediastinum and pulmonary artery invasion.stage IV tissue is insufficient for EGFR and ALK MUTATION Gu[place] Blood days is not positivefor any EGFR oor ALK mutation 2. Starting radiation and chemotherapy from April 14, 2014. Patient was started on carboplatinum andTaxol Herve were developed an allergic reaction to Taxol so would be changed to Abraxane 3. Patient has finished 6 cycles of weekly chemotherapy with carboplatinum and radiation therapy(May 28, 2014) 4. Started on [facility] because of persistent disease July 02, 2014  PAST MEDICAL HISTORY: Past Medical History  Diagnosis Date  . Hypertension   . Hypercholesterolemia   . Osteoporosis   . Hypercalcemia     familial hypocalciuric hypercalcemia  . Chicken pox   . Thyroid disease   . Lung cancer     PAST SURGICAL HISTORY: Past Surgical History  Procedure Laterality Date  . Transvaginal hysterectomy  04/18/05    with anterior colporrhaphy  . Cholecystectomy    . Abdominal hysterectomy      partial    FAMILY HISTORY Family History  Problem Relation Age of Onset  . Stroke Mother   . Hypertension Mother   . Prostate cancer Father   . Cancer Father     prostate  . Heart disease Brother     s/p CABG  . Colon cancer Neg Hx     GYNECOLOGIC HISTORY:  No LMP recorded. Patient has had a hysterectomy.     ADVANCED DIRECTIVES:    HEALTH MAINTENANCE: History  Substance Use Topics  . Smoking status: Former Smoker    Quit date: 06/13/1997  . Smokeless tobacco: Never Used  .  Alcohol Use: No     Colonoscopy:  PAP:  Bone density:  Lipid panel:  Allergies  Allergen Reactions  . Paclitaxel Other (See Comments)    Chest tightness    Current Outpatient Prescriptions  Medication Sig Dispense Refill  . amLODipine (NORVASC)  10 MG tablet Take 1 tablet (10 mg total) by mouth daily. 30 tablet 5  . aspirin 81 MG tablet Take 81 mg by mouth daily.    . Calcium Carbonate (CALTRATE 600 PO) Take 1 tablet by mouth daily.    . cholecalciferol (VITAMIN D) 400 UNITS TABS Take 400 Units by mouth daily.    [name] dextromethorphan (DELSYM) 30 MG/5ML liquid Take 30 mg by mouth every 12 (twelve) hours as needed for cough.    . fluticasone (FLONASE) 50 MCG/ACT nasal spray Place 2 sprays into both nostrils daily. 16 g 5  . nystatin cream (MYCOSTATIN) Apply topically 2 (two) times daily. 30 g 0  . olmesartan-hydrochlorothiazide (BENICAR HCT) 20-12.5 MG per tablet Take 1 tablet by mouth daily. 30 tablet 5  . pantoprazole (PROTONIX) 40 MG tablet Take 1 tablet (40 mg total) by mouth daily. 30 tablet 5  . predniSONE (DELTASONE) 10 MG tablet Take 1 tablet (10 mg total) by mouth daily with breakfast. 30 tablet 1  . predniSONE (DELTASONE) 50 MG tablet Take 1 tablet (50 mg total) by mouth daily with breakfast. Take for 5 days starting on 6/24. 5 tablet 0  . azithromycin (ZITHROMAX) 500 MG tablet Take 1 tablet (500 mg total) by mouth daily. (Patient not taking: Reported on 11/06/2014) 4 tablet 0   Current Facility-Administered Medications  Medication Dose Route Frequency Provider Last Rate Last Dose  . 0.9 %  sodium chloride infusion   Intravenous Once Evlyn Kanner, NP       Facility-Administered Medications Ordered in Other Visits  Medication Dose Route Frequency Provider Last Rate Last Dose  . sodium chloride 0.9 % 1,000 mL with potassium chloride 20 mEq, magnesium sulfate 2 g infusion   Intravenous Continuous Forest Gleason, MD   Stopped at 12/04/14 1300  . sodium chloride 0.9 % injection 10 mL  10 mL Intracatheter PRN Forest Gleason, MD        OBJECTIVE: BP 128/74 mmHg  Pulse 84  Temp(Src) 96.7 F (35.9 C) (Tympanic)  Wt 137 lb 9.1 oz (62.4 kg)   Body mass index is 23.49 kg/(m^2).    ECOG FS:1 - Symptomatic but completely  ambulatory  General: Well-developed, well-nourished, no acute distress. Lungs: Clear to auscultation bilaterally. Heart: Regular rate and rhythm. No rubs, murmurs, or gallops. Abdomen: Soft, nontender, nondistended. No organomegaly noted, normoactive bowel sounds. Musculoskeletal: No edema, cyanosis, or clubbing. Neuro: Alert, answering all questions appropriately. Cranial nerves grossly intact. Skin: No rashes or petechiae noted. Psych: Normal affect.    LAB RESULTS:  Appointment on 12/05/2014  Component Date Value Ref Range Status  . Magnesium 12/05/2014 2.0  1.7 - 2.4 mg/dL Final  Clinical Support on 12/04/2014  Component Date Value Ref Range Status  . WBC 12/04/2014 11.7* 3.6 - 11.0 K/uL Final  . RBC 12/04/2014 4.13  3.80 - 5.20 MIL/uL Final  . Hemoglobin 12/04/2014 11.5* 12.0 - 16.0 g/dL Final  . HCT 12/04/2014 35.0  35.0 - 47.0 % Final  . MCV 12/04/2014 84.7  80.0 - 100.0 fL Final  . MCH 12/04/2014 27.8  26.0 - 34.0 pg Final  . MCHC 12/04/2014 32.8  32.0 - 36.0 g/dL Final  . RDW 12/04/2014 16.0* 11.5 -  14.5 % Final  . Platelets 12/04/2014 259  150 - 440 K/uL Final  . Neutrophils Relative % 12/04/2014 78   Final  . Neutro Abs 12/04/2014 9.1* 1.4 - 6.5 K/uL Final  . Lymphocytes Relative 12/04/2014 8   Final  . Lymphs Abs 12/04/2014 1.0  1.0 - 3.6 K/uL Final  . Monocytes Relative 12/04/2014 13   Final  . Monocytes Absolute 12/04/2014 1.5* 0.2 - 0.9 K/uL Final  . Eosinophils Relative 12/04/2014 1   Final  . Eosinophils Absolute 12/04/2014 0.1  0 - 0.7 K/uL Final  . Basophils Relative 12/04/2014 0   Final  . Basophils Absolute 12/04/2014 0.0  0 - 0.1 K/uL Final  . Sodium 12/04/2014 128* 135 - 145 mmol/L Final  . Potassium 12/04/2014 3.2* 3.5 - 5.1 mmol/L Final  . Chloride 12/04/2014 95* 101 - 111 mmol/L Final  . CO2 12/04/2014 24  22 - 32 mmol/L Final  . Glucose, Bld 12/04/2014 159* 65 - 99 mg/dL Final  . BUN 12/04/2014 11  6 - 20 mg/dL Final  . Creatinine, Ser  12/04/2014 0.85  0.44 - 1.00 mg/dL Final  . Calcium 12/04/2014 8.8* 8.9 - 10.3 mg/dL Final  . Total Protein 12/04/2014 7.5  6.5 - 8.1 g/dL Final  . Albumin 12/04/2014 3.7  3.5 - 5.0 g/dL Final  . AST 12/04/2014 15  15 - 41 U/L Final  . ALT 12/04/2014 10* 14 - 54 U/L Final  . Alkaline Phosphatase 12/04/2014 85  38 - 126 U/L Final  . Total Bilirubin 12/04/2014 1.3* 0.3 - 1.2 mg/dL Final  . GFR calc non Af Amer 12/04/2014 >60  >60 mL/min Final  . GFR calc Af Amer 12/04/2014 >60  >60 mL/min Final   Comment: (NOTE) The eGFR has been calculated using the CKD EPI equation. This calculation has not been validated in all clinical situations. eGFR's persistently <60 mL/min signify possible Chronic Kidney Disease.   . Anion gap 12/04/2014 9  5 - 15 Final  . Magnesium 12/04/2014 1.5* 1.7 - 2.4 mg/dL Final    STUDIES: No results found.  ASSESSMENT: Diarrhea secondary to Nivolumab   PLAN:   1. Patient is feeling well following IV fluids and IV Solu-Medrol yesterday for treatment of Nivolumab related diarrhea. We will proceed with IV fluids only today. Patient has follow-up scheduled with Dr. Oliva Bustard next week.  Patient expressed understanding and was in agreement with this plan. She also understands that She can call clinic at any time with any questions, concerns, or complaints.   Dr. Oliva Bustard was available for consultation and review of plan of care for this patient.   Evlyn Kanner, NP   12/05/2014 9:20 AM

## 2014-12-06 ENCOUNTER — Encounter: Payer: Self-pay | Admitting: Oncology

## 2014-12-06 NOTE — Progress Notes (Signed)
[place] @ [facility] [phone]  Fax:(336) [place]: 22-Jun-1930  MR#: [id number]  VZD#:[id number]  Patient Care Team: [name], MD as PCP - General (Internal Medicine)  CHIEF COMPLAINT:  Chief Complaint  Patient presents with  . Follow-up     No history exists.    Oncology Flowsheet 10/23/2014 11/06/2014 11/20/2014 12/04/2014  methylPREDNISolone sodium succinate 125 mg/2 mL (SOLU-MEDROL) IV - - - 60 mg  nivolumab (OPDIVO) IV 200 mg 200 mg 200 mg -  patient is an 81 year old female with probable stage IV (T4 N2 M1) adenocarcinoma of the right lung with intrathoracic lower lobe metastasis as well as a T4 lung lesion with direct invasion of the mediastinum and pulmonary artery invasion.stage IV tissue  is insufficient for EGFR and  ALK MUTATION Guident Blood days is not positivefor any EGFR oor ALK mutation  2.  Starting radiation and chemotherapy from April 14, 2014 Patient was started on carboplatinum andTaxol Herve were developed an allergic reaction to Taxol so would be changed to Abraxane 3.patient has finished 6 cycles of weekly chemotherapy with carboplatinum  and radiation therapy(May 28, 2014) 4.started on  NIVOLULAMAB because of persistent disease July 02, 2014  INTERVAL HISTORY: 81 year old lady came today for further follow-up regarding stage IV carcinoma of lung.  On NIVOLULAMAB.  Started having diarrhea off and on for last 3 days.  Significant for 7-8 loose stools.  No chills.  Low-grade fever.  No nausea no vomiting.[name] REVIEW OF SYSTEMS:  Gen. status: Patient is feeling better.  No chills.  No fever.  Appetite is improving. HEENT: No soreness in the mouth no difficulty swallowing GI: Patient started having frequent diarrhea 6-8 loose stool during the day and night.  No blood.  Watery diarrhea.  Feeling weak and tired.   Abdominal pain no nausea or vomiting  Low-grade fever  GU: No dysuria hematuria Cardiac: No chest pain.   No palpitation. Abdomen: No nausea no vomiting.  No rectal bleeding Neurological system: No headache no dizziness no tingling numbness Lower extremity no edema Skin: No rash  As per HPI. Otherwise, a complete review of systems is negatve.  PAST MEDICAL HISTORY: Past Medical History  Diagnosis Date  . Hypertension   . Hypercholesterolemia   . Osteoporosis   . Hypercalcemia     familial hypocalciuric hypercalcemia  . Chicken pox   . Thyroid disease   . Lung cancer     PAST SURGICAL HISTORY: Past Surgical History  Procedure Laterality Date  . Transvaginal hysterectomy  04/18/05    with anterior colporrhaphy  . Cholecystectomy    . Abdominal hysterectomy      partial    FAMILY HISTORY Family History  Problem Relation Age of Onset  . Stroke Mother   . Hypertension Mother   . Prostate cancer Father   . Cancer Father     prostate  . Heart disease Brother     s/p CABG  . Colon cancer Neg Hx        ADVANCED DIRECTIVES: Patient does have advanced healthcare directive   HEALTH MAINTENANCE: History  Substance Use Topics  . Smoking status: Former Smoker    Quit date: 06/13/1997  . Smokeless tobacco: Never Used  . Alcohol Use: No   :  Allergies  Allergen Reactions  . Paclitaxel Other (See Comments)    Chest tightness    Current Outpatient Prescriptions  Medication Sig Dispense Refill  . amLODipine (NORVASC) 10 MG tablet Take 1  tablet (10 mg total) by mouth daily. 30 tablet 5  . Calcium Carbonate (CALTRATE 600 PO) Take 1 tablet by mouth daily.    . cholecalciferol (VITAMIN D) 400 UNITS TABS Take 400 Units by mouth daily.    [name] olmesartan-hydrochlorothiazide (BENICAR HCT) 20-12.5 MG per tablet Take 1 tablet by mouth daily. 30 tablet 5  . pantoprazole (PROTONIX) 40 MG tablet Take 1 tablet (40 mg total) by mouth daily. 30 tablet 5  . predniSONE (DELTASONE) 10 MG tablet Take 1 tablet (10 mg total) by mouth daily with breakfast. 30 tablet 1  . aspirin 81 MG tablet  Take 81 mg by mouth daily.    [name] azithromycin (ZITHROMAX) 500 MG tablet Take 1 tablet (500 mg total) by mouth daily. (Patient not taking: Reported on 11/06/2014) 4 tablet 0  . dextromethorphan (DELSYM) 30 MG/5ML liquid Take 30 mg by mouth every 12 (twelve) hours as needed for cough.    . fluticasone (FLONASE) 50 MCG/ACT nasal spray Place 2 sprays into both nostrils daily. 16 g 5  . nystatin cream (MYCOSTATIN) Apply topically 2 (two) times daily. 30 g 0  . predniSONE (DELTASONE) 50 MG tablet Take 1 tablet (50 mg total) by mouth daily with breakfast. Take for 5 days starting on 6/24. 5 tablet 0   No current facility-administered medications for this visit.   Facility-Administered Medications Ordered in Other Visits  Medication Dose Route Frequency Provider Last Rate Last Dose  . sodium chloride 0.9 % 1,000 mL with potassium chloride 20 mEq, magnesium sulfate 2 g infusion   Intravenous Continuous Forest Gleason, MD   Stopped at 12/04/14 1300  . sodium chloride 0.9 % injection 10 mL  10 mL Intracatheter PRN Forest Gleason, MD        OBJECTIVE:  Filed Vitals:   12/04/14 0914  BP: 111/79  Pulse: 114  Temp: 98.7 F (37.1 C)     Body mass index is 23.11 kg/(m^2).    ECOG FS:1 - Symptomatic but completely ambulatory Allergies:  Paclitaxel: Chest Tightness  Significant History/PMH:   hypertension:    HTN:    Hysterectomy - Partial:    Cholecystectomy:    Gall Bladder:    Hysterectomy - Partial:   Preventive Screening:  Has patient had any of the following test? Colonscopy  Mammography (1)   Last Colonoscopy: 2010(1)   Last Mammography: 2015(1)   Smoking History: Smoking History 1(1)Smoked 1 pack a day for 30 yrs. Quit 10 yrs. ago(1)  PFSH: Family History: noncontributory  Social History: negative alcohol, negative tobacco  Additional Past Medical and Surgical History: As mentioned above   PHYSICAL EXAM:  GENERAL:  Well developed, well nourished, sitting comfortably in the  exam room in no acute distress. MENTAL STATUS:  Alert and oriented to person, place and time. HEAD:   Normocephalic, atraumatic, face symmetric, no Cushingoid features. EYES:   Pupils equal round and reactive to light and accomodation.  No conjunctivitis or scleral icterus. ENT:  Oropharynx clear without lesion.  Tongue normal. Mucous membranes moist.  RESPIRATORY:  Clear to auscultation without rales, wheezes or rhonchi. CARDIOVASCULAR:  Regular rate and rhythm without murmur, rub or gallop. BREAST:  Right breast without masses, skin changes or nipple discharge.  Left breast without masses, skin changes or nipple discharge. ABDOMEN: Abdomen is soft.  Wound tenderness present.  Bowel sounds are decreased.  BACK:  No CVA tenderness.  No tenderness on percussion of the back or rib cage. SKIN:  No rashes, ulcers or lesions. Skin  turgor is poor  EXTREMITIES: No edema, no skin discoloration or tenderness.  No palpable cords. LYMPH NODES: No palpable cervical, supraclavicular, axillary or inguinal adenopathy  NEUROLOGICAL: Unremarkable. PSYCH:  Appropriate. LAB RESULTS:  Clinical Support on 12/04/2014  Component Date Value Ref Range Status  . WBC 12/04/2014 11.7* 3.6 - 11.0 K/uL Final  . RBC 12/04/2014 4.13  3.80 - 5.20 MIL/uL Final  . Hemoglobin 12/04/2014 11.5* 12.0 - 16.0 g/dL Final  . HCT 12/04/2014 35.0  35.0 - 47.0 % Final  . MCV 12/04/2014 84.7  80.0 - 100.0 fL Final  . MCH 12/04/2014 27.8  26.0 - 34.0 pg Final  . MCHC 12/04/2014 32.8  32.0 - 36.0 g/dL Final  . RDW 12/04/2014 16.0* 11.5 - 14.5 % Final  . Platelets 12/04/2014 259  150 - 440 K/uL Final  . Neutrophils Relative % 12/04/2014 78   Final  . Neutro Abs 12/04/2014 9.1* 1.4 - 6.5 K/uL Final  . Lymphocytes Relative 12/04/2014 8   Final  . Lymphs Abs 12/04/2014 1.0  1.0 - 3.6 K/uL Final  . Monocytes Relative 12/04/2014 13   Final  . Monocytes Absolute 12/04/2014 1.5* 0.2 - 0.9 K/uL Final  . Eosinophils Relative 12/04/2014 1    Final  . Eosinophils Absolute 12/04/2014 0.1  0 - 0.7 K/uL Final  . Basophils Relative 12/04/2014 0   Final  . Basophils Absolute 12/04/2014 0.0  0 - 0.1 K/uL Final  . Sodium 12/04/2014 128* 135 - 145 mmol/L Final  . Potassium 12/04/2014 3.2* 3.5 - 5.1 mmol/L Final  . Chloride 12/04/2014 95* 101 - 111 mmol/L Final  . CO2 12/04/2014 24  22 - 32 mmol/L Final  . Glucose, Bld 12/04/2014 159* 65 - 99 mg/dL Final  . BUN 12/04/2014 11  6 - 20 mg/dL Final  . Creatinine, Ser 12/04/2014 0.85  0.44 - 1.00 mg/dL Final  . Calcium 12/04/2014 8.8* 8.9 - 10.3 mg/dL Final  . Total Protein 12/04/2014 7.5  6.5 - 8.1 g/dL Final  . Albumin 12/04/2014 3.7  3.5 - 5.0 g/dL Final  . AST 12/04/2014 15  15 - 41 U/L Final  . ALT 12/04/2014 10* 14 - 54 U/L Final  . Alkaline Phosphatase 12/04/2014 85  38 - 126 U/L Final  . Total Bilirubin 12/04/2014 1.3* 0.3 - 1.2 mg/dL Final  . GFR calc non Af Amer 12/04/2014 >60  >60 mL/min Final  . GFR calc Af Amer 12/04/2014 >60  >60 mL/min Final   Comment: (NOTE) The eGFR has been calculated using the CKD EPI equation. This calculation has not been validated in all clinical situations. eGFR's persistently <60 mL/min signify possible Chronic Kidney Disease.   . Anion gap 12/04/2014 9  5 - 15 Final  . Magnesium 12/04/2014 1.5* 1.7 - 2.4 mg/dL Final      ASSESSMENT: 81 year old lady with stage IV non-small cell carcinoma of lung Diarrhea most likely secondary to nivolulamab  MEDICAL DECISION MAKING:  All lab data has been reviewed    Hypokalemia  Hypomagnesemia  Dehydration  Hold chemotherapy  Start patient on prednisone 50 mg daily  Intravenous fluid and steroid  Patient will be evaluated tomorrow by my nurse practitioner for follow-up regarding diarrhea  Hold chemotherapy until diarrhea completely resolves  Patient expressed understanding and was in agreement with this plan. She also understands that She can call clinic at any time with any questions, concerns,  or complaints.    No matching staging information was found for the patient.  Delorise Shiner  Jacaden Forbush, MD   12/06/2014 10:50 AM

## 2014-12-09 ENCOUNTER — Encounter: Payer: Self-pay | Admitting: Oncology

## 2014-12-09 ENCOUNTER — Inpatient Hospital Stay: Payer: Medicare Other

## 2014-12-09 ENCOUNTER — Inpatient Hospital Stay (HOSPITAL_BASED_OUTPATIENT_CLINIC_OR_DEPARTMENT_OTHER): Payer: Medicare Other | Admitting: Oncology

## 2014-12-09 VITALS — BP 125/72 | HR 69 | Temp 96.4°F | Wt 134.9 lb

## 2014-12-09 DIAGNOSIS — C781 Secondary malignant neoplasm of mediastinum: Secondary | ICD-10-CM

## 2014-12-09 DIAGNOSIS — E780 Pure hypercholesterolemia: Secondary | ICD-10-CM

## 2014-12-09 DIAGNOSIS — R509 Fever, unspecified: Secondary | ICD-10-CM

## 2014-12-09 DIAGNOSIS — R5383 Other fatigue: Secondary | ICD-10-CM

## 2014-12-09 DIAGNOSIS — C3411 Malignant neoplasm of upper lobe, right bronchus or lung: Secondary | ICD-10-CM | POA: Diagnosis not present

## 2014-12-09 DIAGNOSIS — R197 Diarrhea, unspecified: Secondary | ICD-10-CM | POA: Diagnosis not present

## 2014-12-09 DIAGNOSIS — C3490 Malignant neoplasm of unspecified part of unspecified bronchus or lung: Secondary | ICD-10-CM

## 2014-12-09 DIAGNOSIS — Z7952 Long term (current) use of systemic steroids: Secondary | ICD-10-CM

## 2014-12-09 DIAGNOSIS — Z7982 Long term (current) use of aspirin: Secondary | ICD-10-CM

## 2014-12-09 DIAGNOSIS — C7989 Secondary malignant neoplasm of other specified sites: Secondary | ICD-10-CM

## 2014-12-09 DIAGNOSIS — R531 Weakness: Secondary | ICD-10-CM

## 2014-12-09 DIAGNOSIS — I10 Essential (primary) hypertension: Secondary | ICD-10-CM

## 2014-12-09 DIAGNOSIS — Z79899 Other long term (current) drug therapy: Secondary | ICD-10-CM

## 2014-12-09 LAB — COMPREHENSIVE METABOLIC PANEL
ALBUMIN: 3.5 g/dL (ref 3.5–5.0)
ALK PHOS: 68 U/L (ref 38–126)
ALT: 20 U/L (ref 14–54)
ANION GAP: 5 (ref 5–15)
AST: 20 U/L (ref 15–41)
BUN: 14 mg/dL (ref 6–20)
CHLORIDE: 98 mmol/L — AB (ref 101–111)
CO2: 29 mmol/L (ref 22–32)
Calcium: 9.1 mg/dL (ref 8.9–10.3)
Creatinine, Ser: 0.77 mg/dL (ref 0.44–1.00)
GFR calc Af Amer: >60 mL/min (ref >60)
GFR calc non Af Amer: >60 mL/min (ref >60)
GLUCOSE: 115 mg/dL — AB (ref 65–99)
POTASSIUM: 3 mmol/L — AB (ref 3.5–5.1)
Sodium: 132 mmol/L — ABNORMAL LOW (ref 135–145)
TOTAL PROTEIN: 6.8 g/dL (ref 6.5–8.1)
Total Bilirubin: 0.3 mg/dL (ref 0.3–1.2)

## 2014-12-09 LAB — CBC WITH DIFFERENTIAL/PLATELET
BASOS ABS: 0 10*3/uL (ref 0–0.1)
BASOS PCT: 0 %
EOS ABS: 0.1 10*3/uL (ref 0–0.7)
EOS PCT: 1 %
HEMATOCRIT: 35.7 % (ref 35.0–47.0)
Hemoglobin: 11.9 g/dL — ABNORMAL LOW (ref 12.0–16.0)
Lymphocytes Relative: 13 %
Lymphs Abs: 1.3 10*3/uL (ref 1.0–3.6)
MCH: 27.9 pg (ref 26.0–34.0)
MCHC: 33.3 g/dL (ref 32.0–36.0)
MCV: 83.8 fL (ref 80.0–100.0)
MONO ABS: 1.1 10*3/uL — AB (ref 0.2–0.9)
Monocytes Relative: 10 %
Neutro Abs: 8.2 10*3/uL — ABNORMAL HIGH (ref 1.4–6.5)
Neutrophils Relative %: 76 %
PLATELETS: 350 10*3/uL (ref 150–440)
RBC: 4.26 MIL/uL (ref 3.80–5.20)
RDW: 15.5 % — AB (ref 11.5–14.5)
WBC: 10.7 10*3/uL (ref 3.6–11.0)

## 2014-12-09 LAB — MAGNESIUM: Magnesium: 1.8 mg/dL (ref 1.7–2.4)

## 2014-12-09 MED ORDER — SODIUM CHLORIDE 0.9 % IV SOLN
INTRAVENOUS | Status: DC
  Administered 2014-12-09: 10:00:00 via INTRAVENOUS
  Filled 2014-12-09: qty 1000

## 2014-12-09 MED ORDER — POTASSIUM CHLORIDE 2 MEQ/ML IV SOLN
INTRAVENOUS | Status: DC
  Administered 2014-12-09: 11:00:00 via INTRAVENOUS
  Filled 2014-12-09: qty 500

## 2014-12-09 MED ORDER — SODIUM CHLORIDE 0.9 % IV SOLN
8.0000 mg | Freq: Once | INTRAVENOUS | Status: AC
  Administered 2014-12-09: 8 mg via INTRAVENOUS
  Filled 2014-12-09: qty 0.8

## 2014-12-09 NOTE — Progress Notes (Signed)
[place] @ [facility] [phone]  Fax:(336) [place]: 15-May-1931  MR#: [id number]  ZMO#:[id number]  Patient Care Team: [name], MD as PCP - General (Internal Medicine)  CHIEF COMPLAINT:  Chief Complaint  Patient presents with  . Follow-up     No history exists.    Oncology Flowsheet 10/23/2014 11/06/2014 11/20/2014 12/04/2014 12/09/2014  dexamethasone (DECADRON) IV - - - - 8 mg  methylPREDNISolone sodium succinate 125 mg/2 mL (SOLU-MEDROL) IV - - - 60 mg -  nivolumab (OPDIVO) IV 200 mg 200 mg 200 mg - -  patient is an 81 year old female with probable stage IV (T4 N2 M1) adenocarcinoma of the right lung with intrathoracic lower lobe metastasis as well as a T4 lung lesion with direct invasion of the mediastinum and pulmonary artery invasion.stage IV tissue  is insufficient for EGFR and  ALK MUTATION Gu[place] Blood days is not positivefor any EGFR oor ALK mutation  2.  Starting radiation and chemotherapy from April 14, 2014 Patient was started on carboplatinum andTaxol Herve were developed an allergic reaction to Taxol so would be changed to Abraxane 3.patient has finished 6 cycles of weekly chemotherapy with carboplatinum  and radiation therapy(May 28, 2014) 4.started on  NIVOLULAMAB because of persistent disease July 02, 2014  INTERVAL HISTORY: 81 year old lady came today for further follow-up regarding stage IV carcinoma of lung.  On NIVOLULAMAB.  Started having diarrhea off and on for last 3 days.  Significant for 7-8 loose stools.  No chills.  Low-grade fever.  No nausea no vomiting December 09, 2014 With intravenous fluid IV steroid and by mouth steroids patient's diarrhea has somewhat improved.  He had at least 3 loose stools this morning.  Appetite is improving no abdominal pain no fever.  Patient is off NIVOLULAMAB. Nausea or vomiting... REVIEW OF SYSTEMS:  Gen. status: Patient is feeling better.  No chills.  No fever.  Appetite is  improving. HEENT: No soreness in the mouth no difficulty swallowing GI: Patient started having frequent diarrhea 6-8 loose stool during the day and night.  No blood.  Watery diarrhea.  Feeling weak and tired.   Abdominal pain no nausea or vomiting  Low-grade fever  GU: No dysuria hematuria Cardiac: No chest pain.  No palpitation. Abdomen: No nausea no vomiting.  No rectal bleeding Neurological system: No headache no dizziness no tingling numbness Lower extremity no edema Skin: No rash  As per HPI. Otherwise, a complete review of systems is negatve.  PAST MEDICAL HISTORY: Past Medical History  Diagnosis Date  . Hypertension   . Hypercholesterolemia   . Osteoporosis   . Hypercalcemia     familial hypocalciuric hypercalcemia  . Chicken pox   . Thyroid disease   . Lung cancer     PAST SURGICAL HISTORY: Past Surgical History  Procedure Laterality Date  . Transvaginal hysterectomy  04/18/05    with anterior colporrhaphy  . Cholecystectomy    . Abdominal hysterectomy      partial    FAMILY HISTORY Family History  Problem Relation Age of Onset  . Stroke Mother   . Hypertension Mother   . Prostate cancer Father   . Cancer Father     prostate  . Heart disease Brother     s/p CABG  . Colon cancer Neg Hx        ADVANCED DIRECTIVES: Patient does have advanced healthcare directive   HEALTH MAINTENANCE: History  Substance Use Topics  . Smoking status: Former  Smoker    Quit date: 06/13/1997  . Smokeless tobacco: Never Used  . Alcohol Use: No   :  Allergies  Allergen Reactions  . Paclitaxel Other (See Comments)    Chest tightness    Current Outpatient Prescriptions  Medication Sig Dispense Refill  . amLODipine (NORVASC) 10 MG tablet Take 1 tablet (10 mg total) by mouth daily. 30 tablet 5  . aspirin 81 MG tablet Take 81 mg by mouth daily.    [name] azithromycin (ZITHROMAX) 500 MG tablet Take 1 tablet (500 mg total) by mouth daily. 4 tablet 0  . Calcium Carbonate  (CALTRATE 600 PO) Take 1 tablet by mouth daily.    . cholecalciferol (VITAMIN D) 400 UNITS TABS Take 400 Units by mouth daily.    [name] dextromethorphan (DELSYM) 30 MG/5ML liquid Take 30 mg by mouth every 12 (twelve) hours as needed for cough.    . fluticasone (FLONASE) 50 MCG/ACT nasal spray Place 2 sprays into both nostrils daily. 16 g 5  . nystatin cream (MYCOSTATIN) Apply topically 2 (two) times daily. 30 g 0  . olmesartan-hydrochlorothiazide (BENICAR HCT) 20-12.5 MG per tablet Take 1 tablet by mouth daily. 30 tablet 5  . pantoprazole (PROTONIX) 40 MG tablet Take 1 tablet (40 mg total) by mouth daily. 30 tablet 5  . predniSONE (DELTASONE) 10 MG tablet Take 1 tablet (10 mg total) by mouth daily with breakfast. 30 tablet 1   No current facility-administered medications for this visit.   Facility-Administered Medications Ordered in Other Visits  Medication Dose Route Frequency Provider Last Rate Last Dose  . sodium chloride 0.9 % 1,000 mL with potassium chloride 20 mEq, magnesium sulfate 2 g infusion   Intravenous Continuous Forest Gleason, MD   Stopped at 12/04/14 1300  . sodium chloride 0.9 % injection 10 mL  10 mL Intracatheter PRN Forest Gleason, MD        OBJECTIVE:  Filed Vitals:   12/09/14 0911  BP: 125/72  Pulse: 69  Temp: 96.4 F (35.8 C)     Body mass index is 23.03 kg/(m^2).    ECOG FS:1 - Symptomatic but completely ambulatory Allergies:  Paclitaxel: Chest Tightness  Significant History/PMH:   hypertension:    HTN:    Hysterectomy - Partial:    Cholecystectomy:    Gall Bladder:    Hysterectomy - Partial:   Preventive Screening:  Has patient had any of the following test? Colonscopy  Mammography (1)   Last Colonoscopy: 2010(1)   Last Mammography: 2015(1)   Smoking History: Smoking History 1(1)Smoked 1 pack a day for 30 yrs. Quit 10 yrs. ago(1)  PFSH: Family History: noncontributory  Social History: negative alcohol, negative tobacco  Additional Past Medical  and Surgical History: As mentioned above   PHYSICAL EXAM:  GENERAL:  Well developed, well nourished, sitting comfortably in the exam room in no acute distress. MENTAL STATUS:  Alert and oriented to person, place and time. HEAD:   Normocephalic, atraumatic, face symmetric, no Cushingoid features. EYES:   Pupils equal round and reactive to light and accomodation.  No conjunctivitis or scleral icterus. ENT:  Oropharynx clear without lesion.  Tongue normal. Mucous membranes moist.  RESPIRATORY:  Clear to auscultation without rales, wheezes or rhonchi. CARDIOVASCULAR:  Regular rate and rhythm without murmur, rub or gallop. BREAST:  Right breast without masses, skin changes or nipple discharge.  Left breast without masses, skin changes or nipple discharge. ABDOMEN: Abdomen is soft.  Wound tenderness present.  Bowel sounds are decreased.  BACK:  No CVA tenderness.  No tenderness on percussion of the back or rib cage. SKIN:  No rashes, ulcers or lesions. Skin turgor is poor  EXTREMITIES: No edema, no skin discoloration or tenderness.  No palpable cords. LYMPH NODES: No palpable cervical, supraclavicular, axillary or inguinal adenopathy  NEUROLOGICAL: Unremarkable. PSYCH:  Appropriate. LAB RESULTS:  Appointment on 12/09/2014  Component Date Value Ref Range Status  . WBC 12/09/2014 10.7  3.6 - 11.0 K/uL Final  . RBC 12/09/2014 4.26  3.80 - 5.20 MIL/uL Final  . Hemoglobin 12/09/2014 11.9* 12.0 - 16.0 g/dL Final  . HCT 12/09/2014 35.7  35.0 - 47.0 % Final  . MCV 12/09/2014 83.8  80.0 - 100.0 fL Final  . MCH 12/09/2014 27.9  26.0 - 34.0 pg Final  . MCHC 12/09/2014 33.3  32.0 - 36.0 g/dL Final  . RDW 12/09/2014 15.5* 11.5 - 14.5 % Final  . Platelets 12/09/2014 350  150 - 440 K/uL Final  . Neutrophils Relative % 12/09/2014 76   Final  . Neutro Abs 12/09/2014 8.2* 1.4 - 6.5 K/uL Final  . Lymphocytes Relative 12/09/2014 13   Final  . Lymphs Abs 12/09/2014 1.3  1.0 - 3.6 K/uL Final  . Monocytes  Relative 12/09/2014 10   Final  . Monocytes Absolute 12/09/2014 1.1* 0.2 - 0.9 K/uL Final  . Eosinophils Relative 12/09/2014 1   Final  . Eosinophils Absolute 12/09/2014 0.1  0 - 0.7 K/uL Final  . Basophils Relative 12/09/2014 0   Final  . Basophils Absolute 12/09/2014 0.0  0 - 0.1 K/uL Final  . Sodium 12/09/2014 132* 135 - 145 mmol/L Final  . Potassium 12/09/2014 3.0* 3.5 - 5.1 mmol/L Final  . Chloride 12/09/2014 98* 101 - 111 mmol/L Final  . CO2 12/09/2014 29  22 - 32 mmol/L Final  . Glucose, Bld 12/09/2014 115* 65 - 99 mg/dL Final  . BUN 12/09/2014 14  6 - 20 mg/dL Final  . Creatinine, Ser 12/09/2014 0.77  0.44 - 1.00 mg/dL Final  . Calcium 12/09/2014 9.1  8.9 - 10.3 mg/dL Final  . Total Protein 12/09/2014 6.8  6.5 - 8.1 g/dL Final  . Albumin 12/09/2014 3.5  3.5 - 5.0 g/dL Final  . AST 12/09/2014 20  15 - 41 U/L Final  . ALT 12/09/2014 20  14 - 54 U/L Final  . Alkaline Phosphatase 12/09/2014 68  38 - 126 U/L Final  . Total Bilirubin 12/09/2014 0.3  0.3 - 1.2 mg/dL Final  . GFR calc non Af Amer 12/09/2014 >60  >60 mL/min Final  . GFR calc Af Amer 12/09/2014 >60  >60 mL/min Final   Comment: (NOTE) The eGFR has been calculated using the CKD EPI equation. This calculation has not been validated in all clinical situations. eGFR's persistently <60 mL/min signify possible Chronic Kidney Disease.   . Anion gap 12/09/2014 5  5 - 15 Final  . Magnesium 12/09/2014 1.8  1.7 - 2.4 mg/dL Final      ASSESSMENT: 83-year-old lady with stage IV non-small cell carcinoma of lung Diarrhea most likely secondary to nivolulamab  MEDICAL DECISION MAKING:  Lab data has been reviewed.  Diarrhea is slowly improving we will continue IV fluid IV potassium and steroid hold chemotherapy.  Repeat PET scan in 2 weeks and decide on further line of treatment    No matching staging information was found for the patient.  Janak Choksi, MD   12/09/2014 6:12 PM   

## 2014-12-09 NOTE — Progress Notes (Signed)
Patient does have living will.  Former smoker.

## 2014-12-17 ENCOUNTER — Telehealth: Payer: Self-pay | Admitting: *Deleted

## 2014-12-17 DIAGNOSIS — R197 Diarrhea, unspecified: Secondary | ICD-10-CM

## 2014-12-17 NOTE — Telephone Encounter (Signed)
Per Dr Oliva Bustard, He needs to see pt in morning and she is to bring in all her meds Will check stool for C diff, draw blood for CBC, METB and Mg+.  Spoke with patient who repeated to me to bring her meds and to be here tomorrow at 915 for lab and see md

## 2014-12-18 ENCOUNTER — Inpatient Hospital Stay: Payer: Medicare Other | Attending: Oncology | Admitting: Oncology

## 2014-12-18 ENCOUNTER — Inpatient Hospital Stay: Payer: Medicare Other

## 2014-12-18 VITALS — BP 113/72 | HR 86 | Temp 96.7°F | Wt 130.3 lb

## 2014-12-18 DIAGNOSIS — R197 Diarrhea, unspecified: Secondary | ICD-10-CM | POA: Insufficient documentation

## 2014-12-18 DIAGNOSIS — Z7952 Long term (current) use of systemic steroids: Secondary | ICD-10-CM | POA: Diagnosis not present

## 2014-12-18 DIAGNOSIS — Z9221 Personal history of antineoplastic chemotherapy: Secondary | ICD-10-CM | POA: Diagnosis not present

## 2014-12-18 DIAGNOSIS — C7989 Secondary malignant neoplasm of other specified sites: Secondary | ICD-10-CM

## 2014-12-18 DIAGNOSIS — Z79899 Other long term (current) drug therapy: Secondary | ICD-10-CM | POA: Insufficient documentation

## 2014-12-18 DIAGNOSIS — C781 Secondary malignant neoplasm of mediastinum: Secondary | ICD-10-CM | POA: Diagnosis not present

## 2014-12-18 DIAGNOSIS — Z87891 Personal history of nicotine dependence: Secondary | ICD-10-CM | POA: Diagnosis not present

## 2014-12-18 DIAGNOSIS — C3411 Malignant neoplasm of upper lobe, right bronchus or lung: Secondary | ICD-10-CM | POA: Insufficient documentation

## 2014-12-18 DIAGNOSIS — Z923 Personal history of irradiation: Secondary | ICD-10-CM | POA: Insufficient documentation

## 2014-12-18 DIAGNOSIS — R109 Unspecified abdominal pain: Secondary | ICD-10-CM | POA: Diagnosis not present

## 2014-12-18 DIAGNOSIS — Z888 Allergy status to other drugs, medicaments and biological substances status: Secondary | ICD-10-CM | POA: Insufficient documentation

## 2014-12-18 DIAGNOSIS — E876 Hypokalemia: Secondary | ICD-10-CM | POA: Insufficient documentation

## 2014-12-18 DIAGNOSIS — I10 Essential (primary) hypertension: Secondary | ICD-10-CM | POA: Diagnosis not present

## 2014-12-18 DIAGNOSIS — C3490 Malignant neoplasm of unspecified part of unspecified bronchus or lung: Secondary | ICD-10-CM

## 2014-12-18 DIAGNOSIS — R509 Fever, unspecified: Secondary | ICD-10-CM | POA: Insufficient documentation

## 2014-12-18 DIAGNOSIS — E780 Pure hypercholesterolemia: Secondary | ICD-10-CM | POA: Diagnosis not present

## 2014-12-18 DIAGNOSIS — M810 Age-related osteoporosis without current pathological fracture: Secondary | ICD-10-CM | POA: Diagnosis not present

## 2014-12-18 DIAGNOSIS — Z7982 Long term (current) use of aspirin: Secondary | ICD-10-CM | POA: Insufficient documentation

## 2014-12-18 LAB — CBC WITH DIFFERENTIAL/PLATELET
BASOS PCT: 0 %
Basophils Absolute: 0 10*3/uL (ref 0–0.1)
EOS PCT: 1 %
Eosinophils Absolute: 0.1 10*3/uL (ref 0–0.7)
HCT: 35.5 % (ref 35.0–47.0)
Hemoglobin: 11.7 g/dL — ABNORMAL LOW (ref 12.0–16.0)
Lymphocytes Relative: 11 %
Lymphs Abs: 0.8 10*3/uL — ABNORMAL LOW (ref 1.0–3.6)
MCH: 27.7 pg (ref 26.0–34.0)
MCHC: 32.8 g/dL (ref 32.0–36.0)
MCV: 84.5 fL (ref 80.0–100.0)
Monocytes Absolute: 0.8 10*3/uL (ref 0.2–0.9)
Monocytes Relative: 10 %
NEUTROS PCT: 78 %
Neutro Abs: 5.9 10*3/uL (ref 1.4–6.5)
Platelets: 308 10*3/uL (ref 150–440)
RBC: 4.2 MIL/uL (ref 3.80–5.20)
RDW: 15.8 % — AB (ref 11.5–14.5)
WBC: 7.7 10*3/uL (ref 3.6–11.0)

## 2014-12-18 LAB — C DIFFICILE QUICK SCREEN W PCR REFLEX
C Diff antigen: NEGATIVE
C Diff interpretation: NEGATIVE
C Diff toxin: NEGATIVE

## 2014-12-18 LAB — COMPREHENSIVE METABOLIC PANEL
ALK PHOS: 82 U/L (ref 38–126)
ALT: 14 U/L (ref 14–54)
AST: 17 U/L (ref 15–41)
Albumin: 3.4 g/dL — ABNORMAL LOW (ref 3.5–5.0)
Anion gap: 6 (ref 5–15)
BUN: 12 mg/dL (ref 6–20)
CALCIUM: 8.9 mg/dL (ref 8.9–10.3)
CO2: 28 mmol/L (ref 22–32)
Chloride: 97 mmol/L — ABNORMAL LOW (ref 101–111)
Creatinine, Ser: 0.77 mg/dL (ref 0.44–1.00)
GFR calc Af Amer: >60 mL/min (ref >60)
GFR calc non Af Amer: >60 mL/min (ref >60)
GLUCOSE: 145 mg/dL — AB (ref 65–99)
Potassium: 3.6 mmol/L (ref 3.5–5.1)
SODIUM: 131 mmol/L — AB (ref 135–145)
Total Bilirubin: 0.6 mg/dL (ref 0.3–1.2)
Total Protein: 7.1 g/dL (ref 6.5–8.1)

## 2014-12-18 LAB — MAGNESIUM: MAGNESIUM: 1.8 mg/dL (ref 1.7–2.4)

## 2014-12-18 MED ORDER — PREDNISONE 10 MG PO TABS: ORAL_TABLET | ORAL | Status: DC

## 2014-12-19 ENCOUNTER — Encounter: Payer: Self-pay | Admitting: Oncology

## 2014-12-19 ENCOUNTER — Other Ambulatory Visit: Payer: Self-pay | Admitting: *Deleted

## 2014-12-19 ENCOUNTER — Inpatient Hospital Stay: Payer: Medicare Other

## 2014-12-19 DIAGNOSIS — C3411 Malignant neoplasm of upper lobe, right bronchus or lung: Secondary | ICD-10-CM | POA: Diagnosis not present

## 2014-12-19 DIAGNOSIS — C3490 Malignant neoplasm of unspecified part of unspecified bronchus or lung: Secondary | ICD-10-CM

## 2014-12-19 MED ORDER — SODIUM CHLORIDE 0.9 % IV SOLN
Freq: Once | INTRAVENOUS | Status: AC
  Administered 2014-12-19: 09:00:00 via INTRAVENOUS
  Filled 2014-12-19: qty 1000

## 2014-12-19 NOTE — Progress Notes (Signed)
Heather [place] @ [facility] [phone]) [phone]  Fax:(336) [place]: October 09, 1930  MR#: [id number]  QQI#:[id number]  Patient Care Team: [name], MD as PCP - General (Internal Medicine)  CHIEF COMPLAINT:  Chief Complaint  Patient presents with  . Follow-up      Lung cancer   05/25/2014 Initial Diagnosis Lung cancer    Carcinoma of lung   11/06/2014 Initial Diagnosis Carcinoma of lung    Oncology Flowsheet 10/23/2014 11/06/2014 11/20/2014 12/04/2014 12/09/2014  dexamethasone (DECADRON) IV - - - - 8 mg  methylPREDNISolone sodium succinate 125 mg/2 mL (SOLU-MEDROL) IV - - - 60 mg -  nivolumab (OPDIVO) IV 200 mg 200 mg 200 mg - -  patient is an 81 year old female with probable stage IV (T4 N2 M1) adenocarcinoma of the right lung with intrathoracic lower lobe metastasis as well as a T4 lung lesion with direct invasion of the mediastinum and pulmonary artery invasion.stage IV tissue  is insufficient for EGFR and  ALK MUTATION Gu[place] Blood days is not positivefor any EGFR oor ALK mutation  2.  Starting radiation and chemotherapy from April 14, 2014 Patient was started on carboplatinum andTaxol Herve were developed an allergic reaction to Taxol so would be changed to Abraxane 3.patient has finished 6 cycles of weekly chemotherapy with carboplatinum  and radiation therapy(May 28, 2014) 4.started on  NIVOLULAMAB because of persistent disease July 02, 2014 5.  NIVOLULAMAB on hold because of diarrhea July of 2016  INTERVAL HISTORY: 81 year old lady came today for further follow-up regarding stage IV carcinoma of lung.  On NIVOLULAMAB.  Started having diarrhea off and on for last 3 days.  Significant for 7-8 loose stools.  No chills.  Low-grade fever.  No nausea no vomiting December 09, 2014 With intravenous fluid IV steroid and by mouth steroids patient's diarrhea has somewhat improved.  He had at least 3 loose stools this morning.  Appetite is improving no  abdominal pain no fever.  Patient is off NIVOLULAMAB. Nausea or vomiting... July, 2016 Patient is here for ongoing evaluation and treatment consideration he had diarrhea continues.  Diarrhea is off and on.  Patient is off prednisone.  Does not take any Imodium.  No abdominal pain.  No chills.  No fever. REVIEW OF SYSTEMS:  Gen. status: Patient is feeling better.  No chills.  No fever.  Appetite is improving. HEENT: No soreness in the mouth no difficulty swallowing GI: Patient started having frequent diarrhea 6-8 loose stool during the day and night.  No blood.  Watery diarrhea.  Feeling weak and tired.   Abdominal pain no nausea or vomiting  Low-grade fever  GU: No dysuria hematuria Cardiac: No chest pain.  No palpitation. Abdomen: No nausea no vomiting.  No rectal bleeding Neurological system: No headache no dizziness no tingling numbness Lower extremity no edema Skin: No rash  As per HPI. Otherwise, a complete review of systems is negatve.  PAST MEDICAL HISTORY: Past Medical History  Diagnosis Date  . Hypertension   . Hypercholesterolemia   . Osteoporosis   . Hypercalcemia     familial hypocalciuric hypercalcemia  . Chicken pox   . Thyroid disease   . Lung cancer     PAST SURGICAL HISTORY: Past Surgical History  Procedure Laterality Date  . Transvaginal hysterectomy  04/18/05    with anterior colporrhaphy  . Cholecystectomy    . Abdominal hysterectomy      partial    FAMILY HISTORY Family History  Problem Relation  Age of Onset  . Stroke Mother   . Hypertension Mother   . Prostate cancer Father   . Cancer Father     prostate  . Heart disease Brother     s/p CABG  . Colon cancer Neg Hx        ADVANCED DIRECTIVES: Patient does have advanced healthcare directive   HEALTH MAINTENANCE: History  Substance Use Topics  . Smoking status: Former Smoker    Quit date: 06/13/1997  . Smokeless tobacco: Never Used  . Alcohol Use: No   :  Allergies  Allergen  Reactions  . Paclitaxel Other (See Comments)    Chest tightness    Current Outpatient Prescriptions  Medication Sig Dispense Refill  . Calcium Carbonate (CALTRATE 600 PO) Take 1 tablet by mouth daily.    . cholecalciferol (VITAMIN D) 400 UNITS TABS Take 400 Units by mouth daily.    [name] dextromethorphan (DELSYM) 30 MG/5ML liquid Take 30 mg by mouth every 12 (twelve) hours as needed for cough.    . fluticasone (FLONASE) 50 MCG/ACT nasal spray Place 2 sprays into both nostrils daily. 16 g 5  . nystatin cream (MYCOSTATIN) Apply topically 2 (two) times daily. 30 g 0  . olmesartan-hydrochlorothiazide (BENICAR HCT) 20-12.5 MG per tablet Take 1 tablet by mouth daily. 30 tablet 5  . pantoprazole (PROTONIX) 40 MG tablet Take 1 tablet (40 mg total) by mouth daily. 30 tablet 5  . amLODipine (NORVASC) 10 MG tablet Take 1 tablet (10 mg total) by mouth daily. (Patient not taking: Reported on 12/18/2014) 30 tablet 5  . aspirin 81 MG tablet Take 81 mg by mouth daily.    [name] azithromycin (ZITHROMAX) 500 MG tablet Take 1 tablet (500 mg total) by mouth daily. (Patient not taking: Reported on 12/18/2014) 4 tablet 0  . predniSONE (DELTASONE) 10 MG tablet Take 5 tabs x 1 day, take 4 tabs po x 1 day, take 3 tabs po x 1 day, take 2 tabs po x 1 day, take 1 tab po x 1 day then continue 56m po daily 45 tablet 0   No current facility-administered medications for this visit.   Facility-Administered Medications Ordered in Other Visits  Medication Dose Route Frequency Provider Last Rate Last Dose  . sodium chloride 0.9 % 1,000 mL with potassium chloride 20 mEq, magnesium sulfate 2 g infusion   Intravenous Continuous JForest Gleason MD   Stopped at 12/04/14 1300  . sodium chloride 0.9 % injection 10 mL  10 mL Intracatheter PRN JForest Gleason MD        OBJECTIVE:  Filed Vitals:   12/18/14 1020  BP: 113/72  Pulse: 86  Temp: 96.7 F (35.9 C)     Body mass index is 22.24 kg/(m^2).    ECOG FS:1 - Symptomatic but completely  ambulatory Allergies:  Paclitaxel: Chest Tightness  Significant History/PMH:   hypertension:    HTN:    Hysterectomy - Partial:    Cholecystectomy:    Gall Bladder:    Hysterectomy - Partial:   Preventive Screening:  Has patient had any of the following test? Colonscopy  Mammography (1)   Last Colonoscopy: 2010(1)   Last Mammography: 2015(1)   Smoking History: Smoking History 1(1)Smoked 1 pack a day for 30 yrs. Quit 10 yrs. ago(1)  PFSH: Family History: noncontributory  Social History: negative alcohol, negative tobacco  Additional Past Medical and Surgical History: As mentioned above   PHYSICAL EXAM:  GENERAL:  Well developed, well nourished, sitting comfortably in the exam  room in no acute distress. MENTAL STATUS:  Alert and oriented to person, place and time. HEAD:   Normocephalic, atraumatic, face symmetric, no Cushingoid features. EYES:   Pupils equal round and reactive to light and accomodation.  No conjunctivitis or scleral icterus. ENT:  Oropharynx clear without lesion.  Tongue normal. Mucous membranes moist.  RESPIRATORY:  Clear to auscultation without rales, wheezes or rhonchi. CARDIOVASCULAR:  Regular rate and rhythm without murmur, rub or gallop. . ABDOMEN: Abdomen is soft.  Wound tenderness present.  Bowel sounds are decreased.  BACK:  No CVA tenderness.  No tenderness on percussion of the back or rib cage. SKIN:  No rashes, ulcers or lesions. Skin turgor is poor  EXTREMITIES: No edema, no skin discoloration or tenderness.  No palpable cords. LYMPH NODES: No palpable cervical, supraclavicular, axillary or inguinal adenopathy  NEUROLOGICAL: Unremarkable. PSYCH:  Appropriate. LAB RESULTS:  Office Visit on 12/18/2014  Component Date Value Ref Range Status  . C Diff antigen 12/18/2014 NEGATIVE   Final  . C Diff toxin 12/18/2014 NEGATIVE   Final  . C Diff interpretation 12/18/2014 Negative for C. difficile   Final  Appointment on 12/18/2014  Component  Date Value Ref Range Status  . WBC 12/18/2014 7.7  3.6 - 11.0 K/uL Final  . RBC 12/18/2014 4.20  3.80 - 5.20 MIL/uL Final  . Hemoglobin 12/18/2014 11.7* 12.0 - 16.0 g/dL Final  . HCT 12/18/2014 35.5  35.0 - 47.0 % Final  . MCV 12/18/2014 84.5  80.0 - 100.0 fL Final  . MCH 12/18/2014 27.7  26.0 - 34.0 pg Final  . MCHC 12/18/2014 32.8  32.0 - 36.0 g/dL Final  . RDW 12/18/2014 15.8* 11.5 - 14.5 % Final  . Platelets 12/18/2014 308  150 - 440 K/uL Final  . Neutrophils Relative % 12/18/2014 78   Final  . Neutro Abs 12/18/2014 5.9  1.4 - 6.5 K/uL Final  . Lymphocytes Relative 12/18/2014 11   Final  . Lymphs Abs 12/18/2014 0.8* 1.0 - 3.6 K/uL Final  . Monocytes Relative 12/18/2014 10   Final  . Monocytes Absolute 12/18/2014 0.8  0.2 - 0.9 K/uL Final  . Eosinophils Relative 12/18/2014 1   Final  . Eosinophils Absolute 12/18/2014 0.1  0 - 0.7 K/uL Final  . Basophils Relative 12/18/2014 0   Final  . Basophils Absolute 12/18/2014 0.0  0 - 0.1 K/uL Final  . Sodium 12/18/2014 131* 135 - 145 mmol/L Final  . Potassium 12/18/2014 3.6  3.5 - 5.1 mmol/L Final  . Chloride 12/18/2014 97* 101 - 111 mmol/L Final  . CO2 12/18/2014 28  22 - 32 mmol/L Final  . Glucose, Bld 12/18/2014 145* 65 - 99 mg/dL Final  . BUN 12/18/2014 12  6 - 20 mg/dL Final  . Creatinine, Ser 12/18/2014 0.77  0.44 - 1.00 mg/dL Final  . Calcium 12/18/2014 8.9  8.9 - 10.3 mg/dL Final  . Total Protein 12/18/2014 7.1  6.5 - 8.1 g/dL Final  . Albumin 12/18/2014 3.4* 3.5 - 5.0 g/dL Final  . AST 12/18/2014 17  15 - 41 U/L Final  . ALT 12/18/2014 14  14 - 54 U/L Final  . Alkaline Phosphatase 12/18/2014 82  38 - 126 U/L Final  . Total Bilirubin 12/18/2014 0.6  0.3 - 1.2 mg/dL Final  . GFR calc non Af Amer 12/18/2014 >60  >60 mL/min Final  . GFR calc Af Amer 12/18/2014 >60  >60 mL/min Final   Comment: (NOTE) The eGFR has been calculated using  the CKD EPI equation. This calculation has not been validated in all clinical situations. eGFR's  persistently <60 mL/min signify possible Chronic Kidney Disease.   . Anion gap 12/18/2014 6  5 - 15 Final  . Magnesium 12/18/2014 1.8  1.7 - 2.4 mg/dL Final      ASSESSMENT: 81 year old lady with stage IV non-small cell carcinoma of lung Diarrhea most likely secondary to nivolulamab  MEDICAL DECISION MAKING:  Stool for C. difficile negative  Mild dehydration  Hyponatremia  Start patient back on prednisone 60 mg taper by 10 20  Patient was advised to take Imodium after each loose stool   continue intravenous fluid and IV Solu-Medrol t    No matching staging information was found for the patient.  Forest Gleason, MD   12/19/2014 12:42 PM

## 2014-12-23 ENCOUNTER — Encounter: Payer: Self-pay | Admitting: Oncology

## 2014-12-23 ENCOUNTER — Inpatient Hospital Stay (HOSPITAL_BASED_OUTPATIENT_CLINIC_OR_DEPARTMENT_OTHER): Payer: Medicare Other | Admitting: Oncology

## 2014-12-23 ENCOUNTER — Inpatient Hospital Stay: Payer: Medicare Other

## 2014-12-23 ENCOUNTER — Ambulatory Visit: Payer: Medicare Other

## 2014-12-23 ENCOUNTER — Telehealth: Payer: Self-pay | Admitting: *Deleted

## 2014-12-23 DIAGNOSIS — C3411 Malignant neoplasm of upper lobe, right bronchus or lung: Secondary | ICD-10-CM | POA: Diagnosis not present

## 2014-12-23 DIAGNOSIS — Z7952 Long term (current) use of systemic steroids: Secondary | ICD-10-CM

## 2014-12-23 DIAGNOSIS — I10 Essential (primary) hypertension: Secondary | ICD-10-CM

## 2014-12-23 DIAGNOSIS — C781 Secondary malignant neoplasm of mediastinum: Secondary | ICD-10-CM | POA: Diagnosis not present

## 2014-12-23 DIAGNOSIS — R197 Diarrhea, unspecified: Secondary | ICD-10-CM

## 2014-12-23 DIAGNOSIS — Z923 Personal history of irradiation: Secondary | ICD-10-CM | POA: Diagnosis not present

## 2014-12-23 DIAGNOSIS — C3490 Malignant neoplasm of unspecified part of unspecified bronchus or lung: Secondary | ICD-10-CM

## 2014-12-23 DIAGNOSIS — E780 Pure hypercholesterolemia: Secondary | ICD-10-CM

## 2014-12-23 DIAGNOSIS — Z9221 Personal history of antineoplastic chemotherapy: Secondary | ICD-10-CM | POA: Diagnosis not present

## 2014-12-23 DIAGNOSIS — E876 Hypokalemia: Secondary | ICD-10-CM

## 2014-12-23 DIAGNOSIS — R109 Unspecified abdominal pain: Secondary | ICD-10-CM

## 2014-12-23 DIAGNOSIS — Z79899 Other long term (current) drug therapy: Secondary | ICD-10-CM

## 2014-12-23 DIAGNOSIS — R509 Fever, unspecified: Secondary | ICD-10-CM

## 2014-12-23 LAB — COMPREHENSIVE METABOLIC PANEL
ALK PHOS: 90 U/L (ref 38–126)
ALT: 17 U/L (ref 14–54)
AST: 16 U/L (ref 15–41)
Albumin: 3.8 g/dL (ref 3.5–5.0)
Anion gap: 7 (ref 5–15)
BILIRUBIN TOTAL: 0.3 mg/dL (ref 0.3–1.2)
BUN: 20 mg/dL (ref 6–20)
CALCIUM: 8.8 mg/dL — AB (ref 8.9–10.3)
CO2: 27 mmol/L (ref 22–32)
Chloride: 97 mmol/L — ABNORMAL LOW (ref 101–111)
Creatinine, Ser: 1.13 mg/dL — ABNORMAL HIGH (ref 0.44–1.00)
GFR calc non Af Amer: 44 mL/min — ABNORMAL LOW (ref >60)
GFR, EST AFRICAN AMERICAN: 51 mL/min — AB (ref >60)
GLUCOSE: 181 mg/dL — AB (ref 65–99)
Potassium: 2.9 mmol/L — ABNORMAL LOW (ref 3.5–5.1)
SODIUM: 131 mmol/L — AB (ref 135–145)
Total Protein: 6.8 g/dL (ref 6.5–8.1)

## 2014-12-23 LAB — MAGNESIUM: Magnesium: 1.8 mg/dL (ref 1.7–2.4)

## 2014-12-23 LAB — CBC WITH DIFFERENTIAL/PLATELET
BASOS ABS: 0.1 10*3/uL (ref 0–0.1)
Basophils Relative: 1 %
EOS ABS: 0.1 10*3/uL (ref 0–0.7)
Eosinophils Relative: 1 %
HCT: 36.1 % (ref 35.0–47.0)
HEMOGLOBIN: 11.9 g/dL — AB (ref 12.0–16.0)
Lymphocytes Relative: 11 %
Lymphs Abs: 1 10*3/uL (ref 1.0–3.6)
MCH: 28.1 pg (ref 26.0–34.0)
MCHC: 32.9 g/dL (ref 32.0–36.0)
MCV: 85.3 fL (ref 80.0–100.0)
Monocytes Absolute: 0.5 10*3/uL (ref 0.2–0.9)
Monocytes Relative: 5 %
NEUTROS PCT: 82 %
Neutro Abs: 7.9 10*3/uL — ABNORMAL HIGH (ref 1.4–6.5)
Platelets: 337 10*3/uL (ref 150–440)
RBC: 4.23 MIL/uL (ref 3.80–5.20)
RDW: 15.8 % — ABNORMAL HIGH (ref 11.5–14.5)
WBC: 9.6 10*3/uL (ref 3.6–11.0)

## 2014-12-23 MED ORDER — POTASSIUM CHLORIDE ER 10 MEQ PO TBCR: 10.0000 meq | EXTENDED_RELEASE_TABLET | Freq: Every day | ORAL | Status: DC

## 2014-12-23 MED ORDER — PREDNISONE 10 MG PO TABS: 10.0000 mg | ORAL_TABLET | ORAL | Status: DC

## 2014-12-23 NOTE — Telephone Encounter (Signed)
Natalie from Clinical Lab called with critical K+ 2.9  MD notified.

## 2014-12-23 NOTE — Progress Notes (Signed)
Patient does have living will.  Former smoker.

## 2014-12-23 NOTE — Progress Notes (Signed)
[place] @ [facility] Telephone:(336) [phone]  Fax:(336) [place]: [birth year]  MR#: [id number]  XNT#:[id number]  Patient Care Team: [name], MD as PCP - General (Internal Medicine)  CHIEF COMPLAINT:  Chief Complaint  Patient presents with  . Follow-up    Oncology History   patient is an 81 year old female with probable stage IV (T4 N2 M1) adenocarcinoma of the right lung with intrathoracic lower lobe metastasis as well as a T4 lung lesion with direct invasion of the mediastinum and pulmonary artery invasion.stage IV tissue  is insufficient for EGFR and  ALK MUTATION [place] Blood days is not positivefor any EGFR oor ALK mutation  2.  Starting radiation and chemotherapy from April 14, 2014 Patient was started on carboplatinum andTaxol Herve were developed an allergic reaction to Taxol so would be changed to Abraxane 3.patient has finished 6 cycles of weekly chemotherapy with carboplatinum  and radiation therapy(May 28, 2014) 4.started on  NIVOLULAMAB because of persistent disease July 02, 2014     Carcinoma of lung    Oncology Flowsheet 10/23/2014 11/06/2014 11/20/2014 12/04/2014 12/09/2014  dexamethasone (DECADRON) IV - - - - 8 mg  methylPREDNISolone sodium succinate 125 mg/2 mL (SOLU-MEDROL) IV - - - 60 mg -  nivolumab (OPDIVO) IV 200 mg 200 mg 200 mg - -  patient is an 81 year old female with probable stage IV (T4 N2 M1) adenocarcinoma of the right lung with intrathoracic lower lobe metastasis as well as a T4 lung lesion with direct invasion of the mediastinum and pulmonary artery invasion.stage IV tissue  is insufficient for EGFR and  ALK MUTATION Guident Blood days is not positivefor any EGFR oor ALK mutation  2.  Starting radiation and chemotherapy from April 14, 2014 Patient was started on carboplatinum andTaxol Herve were developed an allergic reaction to Taxol so would be changed to Abraxane 3.patient has finished 6 cycles of weekly  chemotherapy with carboplatinum  and radiation therapy(May 28, 2014) 4.started on  NIVOLULAMAB because of persistent disease July 02, 2014 5.  NIVOLULAMAB on hold because of diarrhea July of 2016  INTERVAL HISTORY:  81 year old lady came today further follow-up regarding diarrhea secondary to [facility] most likely secondary to chemotherapy.\Diarrhea is getting better.  Patient is on prednisone being tapered off every other day.  No chills.  No fever.  No abdominal pain.  Appetite is improving.  Performance status is improving.  She is off all chemotherapy at present time REVIEW OF SYSTEMS:  Gen. status: Patient is feeling better.  No chills.  No fever.  Appetite is improving. HEENT: No soreness in the mouth no difficulty swallowing GI: Nausea vomiting diarrhea is improving  Abdominal pain no nausea or vomiting  Low-grade fever  GU: No dysuria hematuria Cardiac: No chest pain.  No palpitation. Abdomen: No nausea no vomiting.  No rectal bleeding Neurological system: No headache no dizziness no tingling numbness Lower extremity no edema Skin: No rash  As per HPI. Otherwise, a complete review of systems is negatve.  PAST MEDICAL HISTORY: Past Medical History  Diagnosis Date  . Hypertension   . Hypercholesterolemia   . Osteoporosis   . Hypercalcemia     familial hypocalciuric hypercalcemia  . Chicken pox   . Thyroid disease   . Lung cancer     PAST SURGICAL HISTORY: Past Surgical History  Procedure Laterality Date  . Transvaginal hysterectomy  04/18/05    with anterior colporrhaphy  . Cholecystectomy    . Abdominal hysterectomy  partial    FAMILY HISTORY Family History  Problem Relation Age of Onset  . Stroke Mother   . Hypertension Mother   . Prostate cancer Father   . Cancer Father     prostate  . Heart disease Brother     s/p CABG  . Colon cancer Neg Hx        ADVANCED DIRECTIVES: Patient does have advanced healthcare directive   HEALTH  MAINTENANCE: History  Substance Use Topics  . Smoking status: Former Smoker    Quit date: 06/13/1997  . Smokeless tobacco: Never Used  . Alcohol Use: No   :  Allergies  Allergen Reactions  . Paclitaxel Other (See Comments)    Chest tightness    Current Outpatient Prescriptions  Medication Sig Dispense Refill  . Calcium Carbonate (CALTRATE 600 PO) Take 1 tablet by mouth daily.    . cholecalciferol (VITAMIN D) 400 UNITS TABS Take 400 Units by mouth daily.    [name] olmesartan-hydrochlorothiazide (BENICAR HCT) 20-12.5 MG per tablet Take 1 tablet by mouth daily. 30 tablet 5  . pantoprazole (PROTONIX) 40 MG tablet Take 1 tablet (40 mg total) by mouth daily. 30 tablet 5  . potassium chloride (K-DUR) 10 MEQ tablet Take 1 tablet (10 mEq total) by mouth daily. 15 tablet 0  . predniSONE (DELTASONE) 10 MG tablet Take 1 tablet (10 mg total) by mouth every other day. 45 tablet 0   No current facility-administered medications for this visit.   Facility-Administered Medications Ordered in Other Visits  Medication Dose Route Frequency Provider Last Rate Last Dose  . sodium chloride 0.9 % 1,000 mL with potassium chloride 20 mEq, magnesium sulfate 2 g infusion   Intravenous Continuous Forest Gleason, MD   Stopped at 12/04/14 1300  . sodium chloride 0.9 % injection 10 mL  10 mL Intracatheter PRN Forest Gleason, MD        OBJECTIVE:  There were no vitals filed for this visit.   There is no weight on file to calculate BMI.    ECOG FS:1 - Symptomatic but completely ambulatory Allergies:  Paclitaxel: Chest Tightness  Significant History/PMH:   hypertension:    HTN:    Hysterectomy - Partial:    Cholecystectomy:    Gall Bladder:    Hysterectomy - Partial:   Preventive Screening:  Has patient had any of the following test? Colonscopy  Mammography (1)   Last Colonoscopy: 2010(1)   Last Mammography: 2015(1)   Smoking History: Smoking History 1(1)Smoked 1 pack a day for 30 yrs. Quit 10 yrs.  ago(1)  PFSH: Family History: noncontributory  Social History: negative alcohol, negative tobacco  Additional Past Medical and Surgical History: As mentioned above   PHYSICAL EXAM:  GENERAL:  Well developed, well nourished, sitting comfortably in the exam room in no acute distress. MENTAL STATUS:  Alert and oriented to person, place and time. HEAD:   Normocephalic, atraumatic, face symmetric, no Cushingoid features. EYES:   Pupils equal round and reactive to light and accomodation.  No conjunctivitis or scleral icterus. ENT:  Oropharynx clear without lesion.  Tongue normal. Mucous membranes moist.  RESPIRATORY:  Clear to auscultation without rales, wheezes or rhonchi. CARDIOVASCULAR:  Regular rate and rhythm without murmur, rub or gallop. . ABDOMEN: Abdomen is soft.  Wound tenderness present.  Bowel sounds are decreased.  BACK:  No CVA tenderness.  No tenderness on percussion of the back or rib cage. SKIN:  No rashes, ulcers or lesions. Skin turgor is poor  EXTREMITIES: No edema,  no skin discoloration or tenderness.  No palpable cords. LYMPH NODES: No palpable cervical, supraclavicular, axillary or inguinal adenopathy  NEUROLOGICAL: Unremarkable. PSYCH:  Appropriate. LAB RESULTS:  Appointment on 12/23/2014  Component Date Value Ref Range Status  . WBC 12/23/2014 9.6  3.6 - 11.0 K/uL Final  . RBC 12/23/2014 4.23  3.80 - 5.20 MIL/uL Final  . Hemoglobin 12/23/2014 11.9* 12.0 - 16.0 g/dL Final  . HCT 12/23/2014 36.1  35.0 - 47.0 % Final  . MCV 12/23/2014 85.3  80.0 - 100.0 fL Final  . MCH 12/23/2014 28.1  26.0 - 34.0 pg Final  . MCHC 12/23/2014 32.9  32.0 - 36.0 g/dL Final  . RDW 12/23/2014 15.8* 11.5 - 14.5 % Final  . Platelets 12/23/2014 337  150 - 440 K/uL Final  . Neutrophils Relative % 12/23/2014 82   Final  . Neutro Abs 12/23/2014 7.9* 1.4 - 6.5 K/uL Final  . Lymphocytes Relative 12/23/2014 11   Final  . Lymphs Abs 12/23/2014 1.0  1.0 - 3.6 K/uL Final  . Monocytes Relative  12/23/2014 5   Final  . Monocytes Absolute 12/23/2014 0.5  0.2 - 0.9 K/uL Final  . Eosinophils Relative 12/23/2014 1   Final  . Eosinophils Absolute 12/23/2014 0.1  0 - 0.7 K/uL Final  . Basophils Relative 12/23/2014 1   Final  . Basophils Absolute 12/23/2014 0.1  0 - 0.1 K/uL Final  . Magnesium 12/23/2014 1.8  1.7 - 2.4 mg/dL Final  . Sodium 12/23/2014 131* 135 - 145 mmol/L Final  . Potassium 12/23/2014 2.9* 3.5 - 5.1 mmol/L Final   Comment: RESULT REPEATED AND VERIFIED CRITICAL RESULT CALLED TO, READ BACK BY AND VERIFIED WITH: NYO TO ANITA BLACK 1358 12/23/14   . Chloride 12/23/2014 97* 101 - 111 mmol/L Final  . CO2 12/23/2014 27  22 - 32 mmol/L Final  . Glucose, Bld 12/23/2014 181* 65 - 99 mg/dL Final  . BUN 12/23/2014 20  6 - 20 mg/dL Final  . Creatinine, Ser 12/23/2014 1.13* 0.44 - 1.00 mg/dL Final  . Calcium 12/23/2014 8.8* 8.9 - 10.3 mg/dL Final  . Total Protein 12/23/2014 6.8  6.5 - 8.1 g/dL Final  . Albumin 12/23/2014 3.8  3.5 - 5.0 g/dL Final  . AST 12/23/2014 16  15 - 41 U/L Final  . ALT 12/23/2014 17  14 - 54 U/L Final  . Alkaline Phosphatase 12/23/2014 90  38 - 126 U/L Final  . Total Bilirubin 12/23/2014 0.3  0.3 - 1.2 mg/dL Final  . GFR calc non Af Amer 12/23/2014 44* >60 mL/min Final  . GFR calc Af Amer 12/23/2014 51* >60 mL/min Final   Comment: (NOTE) The eGFR has been calculated using the CKD EPI equation. This calculation has not been validated in all clinical situations. eGFR's persistently <60 mL/min signify possible Chronic Kidney Disease.   . Anion gap 12/23/2014 7  5 - 15 Final      ASSESSMENT: 81 year old lady with stage IV non-small cell carcinoma of lung Diarrhea most likely secondary to nivolulamab Diarrhea is improving.  Patient will be taken off NIVOLULAMAB Consider tapering off steroids Repeat PET scan 3 weeks for restaging and consider chemotherapy, If needed  MEDICAL DECISION MAKING:  Stool for C. difficile negative  Hypokalemia Oral  potassium supplements at been advised  Forest Gleason, MD   12/23/2014 8:45 PM

## 2015-01-13 ENCOUNTER — Ambulatory Visit: Payer: Medicare Other

## 2015-01-15 ENCOUNTER — Ambulatory Visit: Payer: Medicare Other | Admitting: Oncology

## 2015-01-15 ENCOUNTER — Ambulatory Visit
Admission: RE | Admit: 2015-01-15 | Discharge: 2015-01-15 | Disposition: A | Discharge status: Home / Self Care | Payer: Medicare Other | Source: Ambulatory Visit | Attending: Oncology | Admitting: Oncology

## 2015-01-15 ENCOUNTER — Other Ambulatory Visit: Payer: Medicare Other

## 2015-01-15 DIAGNOSIS — R911 Solitary pulmonary nodule: Secondary | ICD-10-CM | POA: Insufficient documentation

## 2015-01-15 DIAGNOSIS — I714 Abdominal aortic aneurysm, without rupture: Secondary | ICD-10-CM | POA: Diagnosis not present

## 2015-01-15 DIAGNOSIS — C3490 Malignant neoplasm of unspecified part of unspecified bronchus or lung: Secondary | ICD-10-CM | POA: Insufficient documentation

## 2015-01-15 LAB — GLUCOSE, CAPILLARY: GLUCOSE-CAPILLARY: 131 mg/dL — AB (ref 65–99)

## 2015-01-15 MED ORDER — FLUDEOXYGLUCOSE F - 18 (FDG) INJECTION
11.8300 | Freq: Once | INTRAVENOUS | Status: AC | PRN
  Administered 2015-01-15: 11.83 via INTRAVENOUS

## 2015-01-16 ENCOUNTER — Ambulatory Visit (INDEPENDENT_AMBULATORY_CARE_PROVIDER_SITE_OTHER): Payer: Medicare Other | Admitting: Internal Medicine

## 2015-01-16 ENCOUNTER — Encounter: Payer: Self-pay | Admitting: Internal Medicine

## 2015-01-16 VITALS — BP 128/70 | HR 91 | Temp 98.3°F | Ht 64.0 in | Wt 134.4 lb

## 2015-01-16 DIAGNOSIS — I10 Essential (primary) hypertension: Secondary | ICD-10-CM

## 2015-01-16 DIAGNOSIS — K219 Gastro-esophageal reflux disease without esophagitis: Secondary | ICD-10-CM

## 2015-01-16 DIAGNOSIS — E7800 Pure hypercholesterolemia, unspecified: Secondary | ICD-10-CM

## 2015-01-16 DIAGNOSIS — C3490 Malignant neoplasm of unspecified part of unspecified bronchus or lung: Secondary | ICD-10-CM | POA: Diagnosis not present

## 2015-01-16 DIAGNOSIS — R739 Hyperglycemia, unspecified: Secondary | ICD-10-CM

## 2015-01-16 DIAGNOSIS — M810 Age-related osteoporosis without current pathological fracture: Secondary | ICD-10-CM

## 2015-01-16 NOTE — Progress Notes (Signed)
Patient ID: Heather Cooper, female   DOB: 12/22/1930, 81 y.o.   MRN: [id number]   Subjective:    Patient ID: Heather Cooper, female    DOB: 1931/02/12, 81 y.o.   MRN: [id number]  HPI  Patient here for a scheduled follow up.  Seeing Dr Oliva Bustard.  Has PET scan this week.  Due to f/u with Dr Oliva Bustard next Monday.  Doing well.  No increased cough or congestion.  No sob.  Stays active.  No cardiac symptoms with increased activity or exertion.  No acid reflux.  No dizziness. Diarrhea has resolved.     Past Medical History  Diagnosis Date  . Hypertension   . Hypercholesterolemia   . Osteoporosis   . Hypercalcemia     familial hypocalciuric hypercalcemia  . Chicken pox   . Thyroid disease   . Lung cancer     Outpatient Encounter Prescriptions as of 01/16/2015  Medication Sig  . Calcium Carbonate (CALTRATE 600 PO) Take 1 tablet by mouth daily.  . cholecalciferol (VITAMIN D) 400 UNITS TABS Take 400 Units by mouth daily.  [name] olmesartan-hydrochlorothiazide (BENICAR HCT) 20-12.5 MG per tablet Take 1 tablet by mouth daily.  . pantoprazole (PROTONIX) 40 MG tablet Take 1 tablet (40 mg total) by mouth daily.  . potassium chloride (K-DUR) 10 MEQ tablet Take 1 tablet (10 mEq total) by mouth daily.  . predniSONE (DELTASONE) 10 MG tablet Take 1 tablet (10 mg total) by mouth every other day.   Facility-Administered Encounter Medications as of 01/16/2015  Medication  . sodium chloride 0.9 % 1,000 mL with potassium chloride 20 mEq, magnesium sulfate 2 g infusion  . sodium chloride 0.9 % injection 10 mL    Review of Systems  Constitutional: Negative for appetite change and unexpected weight change.  HENT: Negative for congestion and sinus pressure.   Respiratory: Negative for cough (no significant cough. ), chest tightness and shortness of breath.   Cardiovascular: Negative for chest pain, palpitations and leg swelling.  Gastrointestinal: Negative for nausea, vomiting and abdominal pain.       Diarrhea  has resolved.   Skin: Negative for color change and rash.  Neurological: Negative for dizziness, light-headedness and headaches.  Psychiatric/Behavioral: Negative for dysphoric mood and agitation.       Objective:    Physical Exam  Constitutional: She appears well-developed and well-nourished. No distress.  HENT:  Nose: Nose normal.  Mouth/Throat: Oropharynx is clear and moist.  Neck: Neck supple. No thyromegaly present.  Cardiovascular: Normal rate and regular rhythm.   Pulmonary/Chest: Breath sounds normal. No respiratory distress. She has no wheezes.  Abdominal: Soft. Bowel sounds are normal. There is no tenderness.  Musculoskeletal: She exhibits no edema or tenderness.  Lymphadenopathy:    She has no cervical adenopathy.  Skin: No rash noted. No erythema.  Psychiatric: She has a normal mood and affect. Her behavior is normal.    BP 128/70 mmHg  Pulse 91  Temp(Src) 98.3 F (36.8 C) (Oral)  Ht 5' 4"  (1.626 m)  Wt 134 lb 6 oz (60.952 kg)  BMI 23.05 kg/m2  SpO2 98% Wt Readings from Last 3 Encounters:  01/16/15 134 lb 6 oz (60.952 kg)  12/18/14 130 lb 4.7 oz (59.1 kg)  12/09/14 134 lb 14.7 oz (61.2 kg)     Lab Results  Component Value Date   WBC 9.6 12/23/2014   HGB 11.9* 12/23/2014   HCT 36.1 12/23/2014   PLT 337 12/23/2014   GLUCOSE 181* 12/23/2014  CHOL 155 03/03/2014   TRIG 117.0 03/03/2014   HDL 36.20* 03/03/2014   LDLCALC 95 03/03/2014   ALT 17 12/23/2014   AST 16 12/23/2014   NA 131* 12/23/2014   K 2.9* 12/23/2014   CL 97* 12/23/2014   CREATININE 1.13* 12/23/2014   BUN 20 12/23/2014   CO2 27 12/23/2014   TSH 0.42 01/31/2013   INR 1.0 03/18/2014   INR 1.0 03/18/2014   HGBA1C 6.3 03/03/2014    Nm Pet Image Restag (ps) Skull Base To Thigh  01/15/2015   CLINICAL DATA:  Subsequent treatment strategy for lung carcinoma.  EXAM: NUCLEAR MEDICINE PET SKULL BASE TO THIGH  TECHNIQUE: 11.8 mCi F-18 FDG was injected intravenously. Full-ring PET imaging was  performed from the skull base to thigh after the radiotracer. CT data was obtained and used for attenuation correction and anatomic localization.  FASTING BLOOD GLUCOSE:  Value: 131 mg/dl  COMPARISON:  PET-CT scan 10/07/2014  FINDINGS: NECK  No hypermetabolic lymph nodes in the neck.  CHEST  Nodule or thickening along the RIGHT upper mediastinum measuring 15 mm similar to 18 mm and does not have metabolic activity (image 69, series 3 )mild metabolic activity associated with the curvilinear thickening in the RIGHT middle lobe likely represents postradiation inflammation. 7 mm RIGHT lower lobe pulmonary nodule on image 91, series 3 does not have metabolic activity.  There is a mildly hypermetabolic RIGHT lower paratracheal lymph node and subcarinal lymph node with SUV max equal 4.5 and 4.0 respectively. These small lymph nodes are similar to comparison exam.  ABDOMEN/PELVIS  No abnormal hypermetabolic activity within the liver, pancreas, adrenal glands, or spleen. No hypermetabolic lymph nodes in the abdomen or pelvis. LEFT colon diverticulosis without diverticulitis. Infrarenal abdominal aortic aneurysm measures 3.4 cm.  SKELETON  No focal hypermetabolic activity to suggest skeletal metastasis.  IMPRESSION: 1. Curvilinear thickening and nodularity in the RIGHT upper lobe and RIGHT middle lobe without significant metabolic activity is most consistent with postradiation change. 2. Stable RIGHT lower lobe pulmonary nodule. 3. Small mildly hypermetabolic mediastinal lymph nodes are similar to comparison exam. These mediastinal lymph nodes are indeterminate. 4. No evidence of metastasis in the abdomen or pelvis. 5. Stable infrarenal abdominal aortic aneurysm   Electronically Signed   By: Suzy Bouchard M.D.   On: 01/15/2015 12:36       Assessment & Plan:   Problem List Items Addressed This Visit    Carcinoma of lung    Followed by Dr Oliva Bustard.  Due f/u next week.  Just had PET scan.        GERD  (gastroesophageal reflux disease)    Symptoms controlled on protonix.        Hypercalcemia    Followed by Dr Forde Dandy.  Most recent calcium wnl.  Follow.       Hypercholesterolemia    Low cholesterol diet and exercise.  Follow lipid panel.        Hyperglycemia    Low carb diet and exercise.  Follow met b and a1c.   Lab Results  Component Value Date   HGBA1C 6.3 03/03/2014        Hypertension - Primary    Blood pressure under good control.  Continue same medication regimen.  Follow pressures.  Follow metabolic panel.        Osteoporosis    Taking vitamin D supplements.  Declines further treatments.            [name], MD

## 2015-01-16 NOTE — Progress Notes (Signed)
Pre visit review using our clinic review tool, if applicable. No additional management support is needed unless otherwise documented below in the visit note. 

## 2015-01-18 ENCOUNTER — Encounter: Payer: Self-pay | Admitting: Internal Medicine

## 2015-01-18 NOTE — Assessment & Plan Note (Signed)
Low cholesterol diet and exercise.  Follow lipid panel.   

## 2015-01-18 NOTE — Assessment & Plan Note (Signed)
Followed by Dr Oliva Bustard.  Due f/u next week.  Just had PET scan.

## 2015-01-18 NOTE — Assessment & Plan Note (Signed)
Symptoms controlled on protonix.

## 2015-01-18 NOTE — Assessment & Plan Note (Signed)
Followed by Dr Forde Dandy.  Most recent calcium wnl.  Follow.

## 2015-01-18 NOTE — Assessment & Plan Note (Signed)
Taking vitamin D supplements.  Declines further treatments.

## 2015-01-18 NOTE — Assessment & Plan Note (Signed)
Low carb diet and exercise.  Follow met b and a1c.   Lab Results  Component Value Date   HGBA1C 6.3 03/03/2014

## 2015-01-18 NOTE — Assessment & Plan Note (Signed)
Blood pressure under good control.  Continue same medication regimen.  Follow pressures.  Follow metabolic panel.   

## 2015-01-19 ENCOUNTER — Inpatient Hospital Stay (HOSPITAL_BASED_OUTPATIENT_CLINIC_OR_DEPARTMENT_OTHER): Payer: Medicare Other | Admitting: Oncology

## 2015-01-19 ENCOUNTER — Inpatient Hospital Stay: Payer: Medicare Other | Attending: Oncology

## 2015-01-19 VITALS — BP 121/70 | HR 94 | Temp 97.0°F | Wt 133.1 lb

## 2015-01-19 DIAGNOSIS — I10 Essential (primary) hypertension: Secondary | ICD-10-CM | POA: Insufficient documentation

## 2015-01-19 DIAGNOSIS — E780 Pure hypercholesterolemia: Secondary | ICD-10-CM | POA: Diagnosis not present

## 2015-01-19 DIAGNOSIS — Z9221 Personal history of antineoplastic chemotherapy: Secondary | ICD-10-CM | POA: Insufficient documentation

## 2015-01-19 DIAGNOSIS — R5383 Other fatigue: Secondary | ICD-10-CM | POA: Diagnosis not present

## 2015-01-19 DIAGNOSIS — R531 Weakness: Secondary | ICD-10-CM | POA: Insufficient documentation

## 2015-01-19 DIAGNOSIS — R197 Diarrhea, unspecified: Secondary | ICD-10-CM | POA: Diagnosis not present

## 2015-01-19 DIAGNOSIS — Z923 Personal history of irradiation: Secondary | ICD-10-CM | POA: Insufficient documentation

## 2015-01-19 DIAGNOSIS — Z87891 Personal history of nicotine dependence: Secondary | ICD-10-CM | POA: Insufficient documentation

## 2015-01-19 DIAGNOSIS — C3411 Malignant neoplasm of upper lobe, right bronchus or lung: Secondary | ICD-10-CM | POA: Diagnosis not present

## 2015-01-19 DIAGNOSIS — C781 Secondary malignant neoplasm of mediastinum: Secondary | ICD-10-CM

## 2015-01-19 DIAGNOSIS — Z79899 Other long term (current) drug therapy: Secondary | ICD-10-CM | POA: Insufficient documentation

## 2015-01-19 DIAGNOSIS — C3490 Malignant neoplasm of unspecified part of unspecified bronchus or lung: Secondary | ICD-10-CM

## 2015-01-19 LAB — CBC WITH DIFFERENTIAL/PLATELET
BASOS ABS: 0 10*3/uL (ref 0–0.1)
Basophils Relative: 0 %
EOS PCT: 1 %
Eosinophils Absolute: 0.1 10*3/uL (ref 0–0.7)
HCT: 34 % — ABNORMAL LOW (ref 35.0–47.0)
HEMOGLOBIN: 11.4 g/dL — AB (ref 12.0–16.0)
Lymphocytes Relative: 10 %
Lymphs Abs: 1.2 10*3/uL (ref 1.0–3.6)
MCH: 27.9 pg (ref 26.0–34.0)
MCHC: 33.4 g/dL (ref 32.0–36.0)
MCV: 83.4 fL (ref 80.0–100.0)
MONO ABS: 0.4 10*3/uL (ref 0.2–0.9)
Monocytes Relative: 3 %
NEUTROS PCT: 86 %
Neutro Abs: 10.4 10*3/uL — ABNORMAL HIGH (ref 1.4–6.5)
PLATELETS: 336 10*3/uL (ref 150–440)
RBC: 4.08 MIL/uL (ref 3.80–5.20)
RDW: 15.1 % — ABNORMAL HIGH (ref 11.5–14.5)
WBC: 12.1 10*3/uL — ABNORMAL HIGH (ref 3.6–11.0)

## 2015-01-19 LAB — COMPREHENSIVE METABOLIC PANEL
ALBUMIN: 3.9 g/dL (ref 3.5–5.0)
ALT: 11 U/L — ABNORMAL LOW (ref 14–54)
ANION GAP: 7 (ref 5–15)
AST: 15 U/L (ref 15–41)
Alkaline Phosphatase: 92 U/L (ref 38–126)
BILIRUBIN TOTAL: 0.6 mg/dL (ref 0.3–1.2)
BUN: 17 mg/dL (ref 6–20)
CALCIUM: 9 mg/dL (ref 8.9–10.3)
CO2: 27 mmol/L (ref 22–32)
CREATININE: 0.96 mg/dL (ref 0.44–1.00)
Chloride: 97 mmol/L — ABNORMAL LOW (ref 101–111)
GFR calc Af Amer: >60 mL/min (ref >60)
GFR calc non Af Amer: 54 mL/min — ABNORMAL LOW (ref >60)
GLUCOSE: 133 mg/dL — AB (ref 65–99)
Potassium: 4.3 mmol/L (ref 3.5–5.1)
Sodium: 131 mmol/L — ABNORMAL LOW (ref 135–145)
Total Protein: 7.7 g/dL (ref 6.5–8.1)

## 2015-01-19 NOTE — Progress Notes (Signed)
Patient does have living will.  Former smoker.  Patient here for PET results.

## 2015-01-24 ENCOUNTER — Encounter: Payer: Self-pay | Admitting: Oncology

## 2015-01-24 NOTE — Progress Notes (Signed)
[place] @ [facility] [phone]  [phone] [place]: 10/25/1930  MR#: [id number]  QQV#:[id number]  Patient Care Team: [name], MD as PCP - General (Internal Medicine)  CHIEF COMPLAINT:  Chief Complaint  Patient presents with  . Follow-up    Oncology History   patient is an 81 year old female with probable stage IV (T4 N2 M1) adenocarcinoma of the right lung with intrathoracic lower lobe metastasis as well as a T4 lung lesion with direct invasion of the mediastinum and pulmonary artery invasion.stage IV tissue  is insufficient for EGFR and  ALK MUTATION [place] Blood days is not positivefor any EGFR oor ALK mutation  2.  Starting radiation and chemotherapy from April 14, 2014 Patient was started on carboplatinum andTaxol Herve were developed an allergic reaction to Taxol so would be changed to Abraxane 3.patient has finished 6 cycles of weekly chemotherapy with carboplatinum  and radiation therapy(May 28, 2014) 4.started on  NIVOLULAMAB because of persistent disease July 02, 2014. 5.  NIVOLULAMAB was discontinued because of persistent diarrhea in July of 2016.  August of 2016 CT scan was stable so no further chemotherapy     Carcinoma of lung    Oncology Flowsheet 10/23/2014 11/06/2014 11/20/2014 12/04/2014 12/09/2014  dexamethasone (DECADRON) IV - - - - 8 mg  methylPREDNISolone sodium succinate 125 mg/2 mL (SOLU-MEDROL) IV - - - 60 mg -  nivolumab (OPDIVO) IV 200 mg 200 mg 200 mg - -    INTERVAL HISTORY: 81 year old lady who had diarrhea secondary to [facility] so was discontinued.  After prednisone therapy patient appetite improved diarrhea improved.  Patient had a repeat PET scan.  At present time no abdominal pain appetite is improving gaining energy level.  Ambulatory.  Here for further follow-up and treatment consideration.  No chills.  No fever. REVIEW OF SYSTEMS:    general status: Patient is feeling weak and tired.  Energy  level is somewhat improving No change in a performance status.  No chills.  No fever. HEENT: .  No evidence of stomatitis Lungs: No cough or shortness of breath Cardiac: No chest pain or paroxysmal nocturnal dyspnea GI: No nausea no vomiting no diarrhea no abdominal pain Diarrhea has improved. Skin: No rash Lower extremity no swelling Neurological system: No tingling.  No numbness.  No other focal signs Musculoskeletal system no bony pains  As per HPI. Otherwise, a complete review of systems is negatve.  PAST MEDICAL HISTORY: Past Medical History  Diagnosis Date  . Hypertension   . Hypercholesterolemia   . Osteoporosis   . Hypercalcemia     familial hypocalciuric hypercalcemia  . Chicken pox   . Thyroid disease   . Lung cancer     PAST SURGICAL HISTORY: Past Surgical History  Procedure Laterality Date  . Transvaginal hysterectomy  04/18/05    with anterior colporrhaphy  . Cholecystectomy    . Abdominal hysterectomy      partial    FAMILY HISTORY Family History  Problem Relation Age of Onset  . Stroke Mother   . Hypertension Mother   . Prostate cancer Father   . Cancer Father     prostate  . Heart disease Brother     s/p CABG  . Colon cancer Neg Hx     ADVANCED DIRECTIVES:   Patient does have advance healthcare directive, Patient   does not desire to make any changes HEALTH MAINTENANCE: Social History  Substance Use Topics  . Smoking status: Former Smoker  Quit date: 06/13/1997  . Smokeless tobacco: Never Used  . Alcohol Use: No      Allergies  Allergen Reactions  . Paclitaxel Other (See Comments)    Chest tightness    Current Outpatient Prescriptions  Medication Sig Dispense Refill  . Calcium Carbonate (CALTRATE 600 PO) Take 1 tablet by mouth daily.    . cholecalciferol (VITAMIN D) 400 UNITS TABS Take 400 Units by mouth daily.    [name] olmesartan-hydrochlorothiazide (BENICAR HCT) 20-12.5 MG per tablet Take 1 tablet by mouth daily. 30 tablet 5  .  pantoprazole (PROTONIX) 40 MG tablet Take 1 tablet (40 mg total) by mouth daily. 30 tablet 5  . potassium chloride (K-DUR) 10 MEQ tablet Take 1 tablet (10 mEq total) by mouth daily. 15 tablet 0  . predniSONE (DELTASONE) 10 MG tablet Take 1 tablet (10 mg total) by mouth every other day. 45 tablet 0   No current facility-administered medications for this visit.   Facility-Administered Medications Ordered in Other Visits  Medication Dose Route Frequency Provider Last Rate Last Dose  . sodium chloride 0.9 % 1,000 mL with potassium chloride 20 mEq, magnesium sulfate 2 g infusion   Intravenous Continuous Forest Gleason, MD   Stopped at 12/04/14 1300  . sodium chloride 0.9 % injection 10 mL  10 mL Intracatheter PRN Forest Gleason, MD        OBJECTIVE:  Filed Vitals:   01/19/15 1512  BP: 121/70  Pulse: 94  Temp: 97 F (36.1 C)     Body mass index is 22.84 kg/(m^2).    ECOG FS:1 - Symptomatic but completely ambulatory  PHYSICAL EXAM: Goal status: Performance status is good.  Patient has not lost significant weight HEENT: No evidence of stomatitis. Sclera and conjunctivae :: No jaundice.   pale looking. Lungs: Air  entry equal on both sides.  No rhonchi.  No rales.  Cardiac: Heart sounds are normal.  No pericardial rub.  No murmur. Lymphatic system: Cervical, axillary, inguinal, lymph nodes not palpable GI: Abdomen is soft.  No ascites.  Liver spleen not palpable.  No tenderness.  Bowel sounds are within normal limit Lower extremity: No edema Neurological system: Higher functions, cranial nerves intact no evidence of peripheral neuropathy. Skin: No rash.  No ecchymosis.[name]   LAB RESULTS:  CBC Latest Ref Rng 01/19/2015 12/23/2014  WBC 3.6 - 11.0 K/uL 12.1(H) 9.6  Hemoglobin 12.0 - 16.0 g/dL 11.4(L) 11.9(L)  Hematocrit 35.0 - 47.0 % 34.0(L) 36.1  Platelets 150 - 440 K/uL 336 337    Appointment on 01/19/2015  Component Date Value Ref Range Status  . WBC 01/19/2015 12.1* 3.6 - 11.0 K/uL Final    . RBC 01/19/2015 4.08  3.80 - 5.20 MIL/uL Final  . Hemoglobin 01/19/2015 11.4* 12.0 - 16.0 g/dL Final  . HCT 01/19/2015 34.0* 35.0 - 47.0 % Final  . MCV 01/19/2015 83.4  80.0 - 100.0 fL Final  . MCH 01/19/2015 27.9  26.0 - 34.0 pg Final  . MCHC 01/19/2015 33.4  32.0 - 36.0 g/dL Final  . RDW 01/19/2015 15.1* 11.5 - 14.5 % Final  . Platelets 01/19/2015 336  150 - 440 K/uL Final  . Neutrophils Relative % 01/19/2015 86   Final  . Neutro Abs 01/19/2015 10.4* 1.4 - 6.5 K/uL Final  . Lymphocytes Relative 01/19/2015 10   Final  . Lymphs Abs 01/19/2015 1.2  1.0 - 3.6 K/uL Final  . Monocytes Relative 01/19/2015 3   Final  . Monocytes Absolute 01/19/2015 0.4  0.2 -  0.9 K/uL Final  . Eosinophils Relative 01/19/2015 1   Final  . Eosinophils Absolute 01/19/2015 0.1  0 - 0.7 K/uL Final  . Basophils Relative 01/19/2015 0   Final  . Basophils Absolute 01/19/2015 0.0  0 - 0.1 K/uL Final  . Sodium 01/19/2015 131* 135 - 145 mmol/L Final  . Potassium 01/19/2015 4.3  3.5 - 5.1 mmol/L Final  . Chloride 01/19/2015 97* 101 - 111 mmol/L Final  . CO2 01/19/2015 27  22 - 32 mmol/L Final  . Glucose, Bld 01/19/2015 133* 65 - 99 mg/dL Final  . BUN 01/19/2015 17  6 - 20 mg/dL Final  . Creatinine, Ser 01/19/2015 0.96  0.44 - 1.00 mg/dL Final  . Calcium 01/19/2015 9.0  8.9 - 10.3 mg/dL Final  . Total Protein 01/19/2015 7.7  6.5 - 8.1 g/dL Final  . Albumin 01/19/2015 3.9  3.5 - 5.0 g/dL Final  . AST 01/19/2015 15  15 - 41 U/L Final  . ALT 01/19/2015 11* 14 - 54 U/L Final  . Alkaline Phosphatase 01/19/2015 92  38 - 126 U/L Final  . Total Bilirubin 01/19/2015 0.6  0.3 - 1.2 mg/dL Final  . GFR calc non Af Amer 01/19/2015 54* >60 mL/min Final  . GFR calc Af Amer 01/19/2015 >60  >60 mL/min Final   Comment: (NOTE) The eGFR has been calculated using the CKD EPI equation. This calculation has not been validated in all clinical situations. eGFR's persistently <60 mL/min signify possible Chronic Kidney Disease.   Georgiann Hahn gap 01/19/2015 7  5 - 15 Final  Hospital Outpatient Visit on 01/15/2015  Component Date Value Ref Range Status  . Glucose-Capillary 01/15/2015 131* 65 - 99 mg/dL Final       STUDIES: Nm Pet Image Restag (ps) Skull Base To Thigh  01/15/2015   CLINICAL DATA:  Subsequent treatment strategy for lung carcinoma.  EXAM: NUCLEAR MEDICINE PET SKULL BASE TO THIGH  TECHNIQUE: 11.8 mCi F-18 FDG was injected intravenously. Full-ring PET imaging was performed from the skull base to thigh after the radiotracer. CT data was obtained and used for attenuation correction and anatomic localization.  FASTING BLOOD GLUCOSE:  Value: 131 mg/dl  COMPARISON:  PET-CT scan 10/07/2014  FINDINGS: NECK  No hypermetabolic lymph nodes in the neck.  CHEST  Nodule or thickening along the RIGHT upper mediastinum measuring 15 mm similar to 18 mm and does not have metabolic activity (image 69, series 3 )mild metabolic activity associated with the curvilinear thickening in the RIGHT middle lobe likely represents postradiation inflammation. 7 mm RIGHT lower lobe pulmonary nodule on image 91, series 3 does not have metabolic activity.  There is a mildly hypermetabolic RIGHT lower paratracheal lymph node and subcarinal lymph node with SUV max equal 4.5 and 4.0 respectively. These small lymph nodes are similar to comparison exam.  ABDOMEN/PELVIS  No abnormal hypermetabolic activity within the liver, pancreas, adrenal glands, or spleen. No hypermetabolic lymph nodes in the abdomen or pelvis. LEFT colon diverticulosis without diverticulitis. Infrarenal abdominal aortic aneurysm measures 3.4 cm.  SKELETON  No focal hypermetabolic activity to suggest skeletal metastasis.  IMPRESSION: 1. Curvilinear thickening and nodularity in the RIGHT upper lobe and RIGHT middle lobe without significant metabolic activity is most consistent with postradiation change. 2. Stable RIGHT lower lobe pulmonary nodule. 3. Small mildly hypermetabolic mediastinal lymph  nodes are similar to comparison exam. These mediastinal lymph nodes are indeterminate. 4. No evidence of metastasis in the abdomen or pelvis. 5. Stable infrarenal abdominal aortic aneurysm  Electronically Signed   By: Suzy Bouchard M.D.   On: 01/15/2015 12:36    ASSESSMENT: Carcinoma of lung stage IV disease Diarrhea is secondary to [facility] is improved   MEDICAL DECISION MAKING:  Pet Scan has been reviewed independently and reviewed with the patient's family No evidence of progressive disease. Symptomatically this elderly patient has been living fairly good.  Will continue prednisone small dose.  Leukocytosis is most likely due to prednisone therapy.  We will continue to observe this patient without chemotherapy and chemotherapy can be resumed at the time of progressive disease.  Patient expressed understanding and was in agreement with this plan. She also understands that She can call clinic at any time with any questions, concerns, or complaints.    No matching staging information was found for the patient.  Forest Gleason, MD   01/24/2015 9:07 AM

## 2015-02-17 ENCOUNTER — Telehealth: Payer: Self-pay | Admitting: Surgical

## 2015-02-17 ENCOUNTER — Other Ambulatory Visit: Payer: Self-pay | Admitting: Internal Medicine

## 2015-02-17 MED ORDER — NYSTATIN 100000 UNIT/GM EX CREA: 1.0000 "application " | TOPICAL_CREAM | Freq: Two times a day (BID) | CUTANEOUS | Status: DC

## 2015-02-17 NOTE — Telephone Encounter (Signed)
Is she using regularly.  Confirm what she needs for.  I am ok to refill x 1, just need to know if problems.  Thanks.

## 2015-02-17 NOTE — Telephone Encounter (Signed)
Patient stated that she has a little rash around the [employer] area.

## 2015-02-17 NOTE — Telephone Encounter (Signed)
Pharmacy is sending a fax for a RX of Nystatin 100000 u/GM topical Cream # 30 going to [facility]. Please advise on refill

## 2015-02-17 NOTE — Addendum Note (Signed)
Addended by: Durwin Glaze on: 02/17/2015 04:41 PM   Modules accepted: Orders

## 2015-02-17 NOTE — Telephone Encounter (Signed)
Sent rx to [place].  Thanks

## 2015-02-17 NOTE — Progress Notes (Signed)
rx for nystatin cream sent to [facility]

## 2015-03-02 ENCOUNTER — Inpatient Hospital Stay: Payer: Medicare Other

## 2015-03-02 ENCOUNTER — Inpatient Hospital Stay: Payer: Medicare Other | Attending: Oncology | Admitting: Oncology

## 2015-03-02 ENCOUNTER — Encounter: Payer: Self-pay | Admitting: Oncology

## 2015-03-02 VITALS — BP 136/73 | HR 89 | Temp 96.4°F | Wt 137.6 lb

## 2015-03-02 DIAGNOSIS — Z23 Encounter for immunization: Secondary | ICD-10-CM | POA: Diagnosis not present

## 2015-03-02 DIAGNOSIS — C3490 Malignant neoplasm of unspecified part of unspecified bronchus or lung: Secondary | ICD-10-CM

## 2015-03-02 DIAGNOSIS — C3411 Malignant neoplasm of upper lobe, right bronchus or lung: Secondary | ICD-10-CM

## 2015-03-02 DIAGNOSIS — Z79899 Other long term (current) drug therapy: Secondary | ICD-10-CM | POA: Insufficient documentation

## 2015-03-02 DIAGNOSIS — Z87891 Personal history of nicotine dependence: Secondary | ICD-10-CM | POA: Insufficient documentation

## 2015-03-02 DIAGNOSIS — R5383 Other fatigue: Secondary | ICD-10-CM | POA: Diagnosis not present

## 2015-03-02 DIAGNOSIS — R531 Weakness: Secondary | ICD-10-CM | POA: Insufficient documentation

## 2015-03-02 DIAGNOSIS — Z923 Personal history of irradiation: Secondary | ICD-10-CM | POA: Insufficient documentation

## 2015-03-02 DIAGNOSIS — Z7952 Long term (current) use of systemic steroids: Secondary | ICD-10-CM | POA: Diagnosis not present

## 2015-03-02 DIAGNOSIS — I10 Essential (primary) hypertension: Secondary | ICD-10-CM | POA: Diagnosis not present

## 2015-03-02 DIAGNOSIS — Z9221 Personal history of antineoplastic chemotherapy: Secondary | ICD-10-CM

## 2015-03-02 DIAGNOSIS — E780 Pure hypercholesterolemia: Secondary | ICD-10-CM | POA: Diagnosis not present

## 2015-03-02 DIAGNOSIS — C781 Secondary malignant neoplasm of mediastinum: Secondary | ICD-10-CM | POA: Diagnosis not present

## 2015-03-02 LAB — CBC WITH DIFFERENTIAL/PLATELET
BASOS PCT: 1 %
Basophils Absolute: 0 10*3/uL (ref 0–0.1)
Eosinophils Absolute: 0.3 10*3/uL (ref 0–0.7)
Eosinophils Relative: 5 %
HCT: 34.9 % — ABNORMAL LOW (ref 35.0–47.0)
Hemoglobin: 11.6 g/dL — ABNORMAL LOW (ref 12.0–16.0)
Lymphocytes Relative: 16 %
Lymphs Abs: 1.2 10*3/uL (ref 1.0–3.6)
MCH: 27.5 pg (ref 26.0–34.0)
MCHC: 33.2 g/dL (ref 32.0–36.0)
MCV: 83 fL (ref 80.0–100.0)
MONO ABS: 0.5 10*3/uL (ref 0.2–0.9)
Monocytes Relative: 7 %
NEUTROS PCT: 71 %
Neutro Abs: 5.2 10*3/uL (ref 1.4–6.5)
Platelets: 296 10*3/uL (ref 150–440)
RBC: 4.2 MIL/uL (ref 3.80–5.20)
RDW: 14.5 % (ref 11.5–14.5)
WBC: 7.3 10*3/uL (ref 3.6–11.0)

## 2015-03-02 LAB — COMPREHENSIVE METABOLIC PANEL
ALBUMIN: 3.6 g/dL (ref 3.5–5.0)
ALT: 12 U/L — ABNORMAL LOW (ref 14–54)
AST: 18 U/L (ref 15–41)
Alkaline Phosphatase: 90 U/L (ref 38–126)
Anion gap: 6 (ref 5–15)
BUN: 19 mg/dL (ref 6–20)
CO2: 26 mmol/L (ref 22–32)
Calcium: 8.6 mg/dL — ABNORMAL LOW (ref 8.9–10.3)
Chloride: 101 mmol/L (ref 101–111)
Creatinine, Ser: 0.72 mg/dL (ref 0.44–1.00)
GFR calc Af Amer: >60 mL/min (ref >60)
GFR calc non Af Amer: >60 mL/min (ref >60)
GLUCOSE: 177 mg/dL — AB (ref 65–99)
POTASSIUM: 3.6 mmol/L (ref 3.5–5.1)
SODIUM: 133 mmol/L — AB (ref 135–145)
Total Bilirubin: 0.6 mg/dL (ref 0.3–1.2)
Total Protein: 7 g/dL (ref 6.5–8.1)

## 2015-03-02 MED ORDER — INFLUENZA VAC SPLIT QUAD 0.5 ML IM SUSY
0.5000 mL | PREFILLED_SYRINGE | Freq: Once | INTRAMUSCULAR | Status: AC
  Administered 2015-03-02: 0.5 mL via INTRAMUSCULAR

## 2015-03-02 NOTE — Progress Notes (Signed)
[place] @ [facility] Telephone:(336) [phone]  Fax:(336) [place]: [birth year]  MR#: [id number]  FWY#:[id number]  Patient Care Team: [name], MD as PCP - General (Internal Medicine)  CHIEF COMPLAINT:  Chief Complaint  Patient presents with  . OTHER    Oncology History   patient is an 81 year old female with probable stage IV (T4 N2 M1) adenocarcinoma of the right lung with intrathoracic lower lobe metastasis as well as a T4 lung lesion with direct invasion of the mediastinum and pulmonary artery invasion.stage IV tissue  is insufficient for EGFR and  ALK MUTATION [place] Blood days is not positivefor any EGFR oor ALK mutation  2.  Starting radiation and chemotherapy from April 14, 2014 Patient was started on carboplatinum andTaxol Herve were developed an allergic reaction to Taxol so would be changed to Abraxane 3.patient has finished 6 cycles of weekly chemotherapy with carboplatinum  and radiation therapy(May 28, 2014) 4.started on  NIVOLULAMAB because of persistent disease July 02, 2014. 5.  NIVOLULAMAB was discontinued because of persistent diarrhea in July of 2016.  August of 2016 CT scan was stable so no further chemotherapy     Carcinoma of lung    Oncology Flowsheet 10/23/2014 11/06/2014 11/20/2014 12/04/2014 12/09/2014  dexamethasone (DECADRON) IV - - - - 8 mg  methylPREDNISolone sodium succinate 125 mg/2 mL (SOLU-MEDROL) IV - - - 60 mg -  nivolumab (OPDIVO) IV 200 mg 200 mg 200 mg - -    INTERVAL HISTORY: 81 year old lady who had diarrhea secondary to [facility] so was discontinued.  After prednisone therapy patient appetite improved diarrhea improved.  Patient had a repeat PET scan.  At present time no abdominal pain appetite is improving gaining energy level.  Ambulatory.  Here for further follow-up and treatment consideration.  No chills.  No fever. February 27, 2015.  Patient is here for ongoing evaluation and treatment  consideration.  Patient is off all chemotherapy Nausea no vomiting no diarrhea.  Patient is taking prednisone 10 mg every other day. Mild elevation of blood sugar.  Last PET scan was in August of 2016 REVIEW OF SYSTEMS:    general status: Patient is feeling weak and tired.  Energy level is somewhat improving No change in a performance status.  No chills.  No fever. HEENT: .  No evidence of stomatitis Lungs: No cough or shortness of breath Cardiac: No chest pain or paroxysmal nocturnal dyspnea GI: No nausea no vomiting no diarrhea no abdominal pain Diarrhea has improved. Skin: No rash Lower extremity no swelling Neurological system: No tingling.  No numbness.  No other focal signs Musculoskeletal system no bony pains  As per HPI. Otherwise, a complete review of systems is negatve.  PAST MEDICAL HISTORY: Past Medical History  Diagnosis Date  . Hypertension   . Hypercholesterolemia   . Osteoporosis   . Hypercalcemia     familial hypocalciuric hypercalcemia  . Chicken pox   . Thyroid disease   . Lung cancer     PAST SURGICAL HISTORY: Past Surgical History  Procedure Laterality Date  . Transvaginal hysterectomy  04/18/05    with anterior colporrhaphy  . Cholecystectomy    . Abdominal hysterectomy      partial    FAMILY HISTORY Family History  Problem Relation Age of Onset  . Stroke Mother   . Hypertension Mother   . Prostate cancer Father   . Cancer Father     prostate  . Heart disease Brother  s/p CABG  . Colon cancer Neg Hx     ADVANCED DIRECTIVES:   Patient does have advance healthcare directive, Patient   does not desire to make any changes HEALTH MAINTENANCE: Social History  Substance Use Topics  . Smoking status: Former Smoker    Quit date: 06/13/1997  . Smokeless tobacco: Never Used  . Alcohol Use: No      Allergies  Allergen Reactions  . Paclitaxel Other (See Comments)    Chest tightness    Current Outpatient Prescriptions  Medication Sig  Dispense Refill  . Calcium Carbonate (CALTRATE 600 PO) Take 1 tablet by mouth daily.    . cholecalciferol (VITAMIN D) 400 UNITS TABS Take 400 Units by mouth daily.    [name] nystatin cream (MYCOSTATIN) Apply 1 application topically 2 (two) times daily. 30 g 0  . olmesartan-hydrochlorothiazide (BENICAR HCT) 20-12.5 MG per tablet Take 1 tablet by mouth daily. 30 tablet 5  . pantoprazole (PROTONIX) 40 MG tablet Take 1 tablet (40 mg total) by mouth daily. 30 tablet 5  . potassium chloride (K-DUR) 10 MEQ tablet Take 1 tablet (10 mEq total) by mouth daily. 15 tablet 0  . predniSONE (DELTASONE) 10 MG tablet Take 1 tablet (10 mg total) by mouth every other day. 45 tablet 0   No current facility-administered medications for this visit.   Facility-Administered Medications Ordered in Other Visits  Medication Dose Route Frequency Provider Last Rate Last Dose  . sodium chloride 0.9 % 1,000 mL with potassium chloride 20 mEq, magnesium sulfate 2 g infusion   Intravenous Continuous Forest Gleason, MD   Stopped at 12/04/14 1300  . sodium chloride 0.9 % injection 10 mL  10 mL Intracatheter PRN Forest Gleason, MD        OBJECTIVE:  Filed Vitals:   03/02/15 0955  BP: 136/73  Pulse: 89  Temp: 96.4 F (35.8 C)     Body mass index is 23.6 kg/(m^2).    ECOG FS:1 - Symptomatic but completely ambulatory  PHYSICAL EXAM: Goal status: Performance status is good.  Patient has not lost significant weight HEENT: No evidence of stomatitis. Sclera and conjunctivae :: No jaundice.   pale looking. Lungs: Air  entry equal on both sides.  No rhonchi.  No rales.  Cardiac: Heart sounds are normal.  No pericardial rub.  No murmur. Lymphatic system: Cervical, axillary, inguinal, lymph nodes not palpable GI: Abdomen is soft.  No ascites.  Liver spleen not palpable.  No tenderness.  Bowel sounds are within normal limit Lower extremity: No edema Neurological system: Higher functions, cranial nerves intact no evidence of peripheral  neuropathy. Skin: No rash.  No ecchymosis.[name]   LAB RESULTS:  CBC Latest Ref Rng 03/02/2015 01/19/2015  WBC 3.6 - 11.0 K/uL 7.3 12.1(H)  Hemoglobin 12.0 - 16.0 g/dL 11.6(L) 11.4(L)  Hematocrit 35.0 - 47.0 % 34.9(L) 34.0(L)  Platelets 150 - 440 K/uL 296 336    Appointment on 03/02/2015  Component Date Value Ref Range Status  . WBC 03/02/2015 7.3  3.6 - 11.0 K/uL Final  . RBC 03/02/2015 4.20  3.80 - 5.20 MIL/uL Final  . Hemoglobin 03/02/2015 11.6* 12.0 - 16.0 g/dL Final  . HCT 03/02/2015 34.9* 35.0 - 47.0 % Final  . MCV 03/02/2015 83.0  80.0 - 100.0 fL Final  . MCH 03/02/2015 27.5  26.0 - 34.0 pg Final  . MCHC 03/02/2015 33.2  32.0 - 36.0 g/dL Final  . RDW 03/02/2015 14.5  11.5 - 14.5 % Final  . Platelets 03/02/2015  296  150 - 440 K/uL Final  . Neutrophils Relative % 03/02/2015 71   Final  . Neutro Abs 03/02/2015 5.2  1.4 - 6.5 K/uL Final  . Lymphocytes Relative 03/02/2015 16   Final  . Lymphs Abs 03/02/2015 1.2  1.0 - 3.6 K/uL Final  . Monocytes Relative 03/02/2015 7   Final  . Monocytes Absolute 03/02/2015 0.5  0.2 - 0.9 K/uL Final  . Eosinophils Relative 03/02/2015 5   Final  . Eosinophils Absolute 03/02/2015 0.3  0 - 0.7 K/uL Final  . Basophils Relative 03/02/2015 1   Final  . Basophils Absolute 03/02/2015 0.0  0 - 0.1 K/uL Final  . Sodium 03/02/2015 133* 135 - 145 mmol/L Final  . Potassium 03/02/2015 3.6  3.5 - 5.1 mmol/L Final  . Chloride 03/02/2015 101  101 - 111 mmol/L Final  . CO2 03/02/2015 26  22 - 32 mmol/L Final  . Glucose, Bld 03/02/2015 177* 65 - 99 mg/dL Final  . BUN 03/02/2015 19  6 - 20 mg/dL Final  . Creatinine, Ser 03/02/2015 0.72  0.44 - 1.00 mg/dL Final  . Calcium 03/02/2015 8.6* 8.9 - 10.3 mg/dL Final  . Total Protein 03/02/2015 7.0  6.5 - 8.1 g/dL Final  . Albumin 03/02/2015 3.6  3.5 - 5.0 g/dL Final  . AST 03/02/2015 18  15 - 41 U/L Final  . ALT 03/02/2015 12* 14 - 54 U/L Final  . Alkaline Phosphatase 03/02/2015 90  38 - 126 U/L Final  . Total  Bilirubin 03/02/2015 0.6  0.3 - 1.2 mg/dL Final  . GFR calc non Af Amer 03/02/2015 >60  >60 mL/min Final  . GFR calc Af Amer 03/02/2015 >60  >60 mL/min Final   Comment: (NOTE) The eGFR has been calculated using the CKD EPI equation. This calculation has not been validated in all clinical situations. eGFR's persistently <60 mL/min signify possible Chronic Kidney Disease.   . Anion gap 03/02/2015 6  5 - 15 Final        ASSESSMENT: Carcinoma of lung stage IV disease Diarrhea is secondary to [facility] is improved Last PET scan in August of 2016 Will continue observation at present time Reevaluation in 2 months.  A repeat PET scan in 4 months.   MEDICAL DECISION MAKING:  Pet Scan has been reviewed independently and reviewed with the patient's family No evidence of progressive disease. Symptomatically this elderly patient has been living fairly good.  Will continue prednisone small dose.  Leukocytosis is most likely due to prednisone therapy.  We will continue to observe this patient without chemotherapy and chemotherapy can be resumed at the time of progressive disease.  Patient expressed understanding and was in agreement with this plan. She also understands that She can call clinic at any time with any questions, concerns, or complaints.    No matching staging information was found for the patient.  Forest Gleason, MD   03/02/2015 10:44 AM

## 2015-03-02 NOTE — Progress Notes (Signed)
Patient does have living will.  Former smoker.

## 2015-03-11 ENCOUNTER — Encounter (INDEPENDENT_AMBULATORY_CARE_PROVIDER_SITE_OTHER): Payer: Medicare Other | Admitting: Ophthalmology

## 2015-03-11 DIAGNOSIS — H3531 Nonexudative age-related macular degeneration: Secondary | ICD-10-CM | POA: Diagnosis not present

## 2015-03-11 DIAGNOSIS — H35033 Hypertensive retinopathy, bilateral: Secondary | ICD-10-CM | POA: Diagnosis not present

## 2015-03-11 DIAGNOSIS — I10 Essential (primary) hypertension: Secondary | ICD-10-CM | POA: Diagnosis not present

## 2015-03-11 DIAGNOSIS — H26491 Other secondary cataract, right eye: Secondary | ICD-10-CM

## 2015-03-11 DIAGNOSIS — H43813 Vitreous degeneration, bilateral: Secondary | ICD-10-CM

## 2015-03-25 ENCOUNTER — Ambulatory Visit (INDEPENDENT_AMBULATORY_CARE_PROVIDER_SITE_OTHER): Payer: Medicare Other | Admitting: Ophthalmology

## 2015-04-06 ENCOUNTER — Other Ambulatory Visit: Payer: Self-pay

## 2015-04-06 ENCOUNTER — Ambulatory Visit (INDEPENDENT_AMBULATORY_CARE_PROVIDER_SITE_OTHER): Payer: Medicare Other | Admitting: Ophthalmology

## 2015-04-06 DIAGNOSIS — H2701 Aphakia, right eye: Secondary | ICD-10-CM

## 2015-04-06 MED ORDER — OLMESARTAN MEDOXOMIL-HCTZ 20-12.5 MG PO TABS: 1.0000 | ORAL_TABLET | Freq: Every day | ORAL | Status: DC

## 2015-04-29 ENCOUNTER — Inpatient Hospital Stay (HOSPITAL_BASED_OUTPATIENT_CLINIC_OR_DEPARTMENT_OTHER): Payer: Medicare Other | Admitting: Oncology

## 2015-04-29 ENCOUNTER — Inpatient Hospital Stay: Payer: Medicare Other | Attending: Oncology

## 2015-04-29 ENCOUNTER — Encounter: Payer: Self-pay | Admitting: Oncology

## 2015-04-29 VITALS — BP 121/67 | HR 79 | Temp 96.5°F | Wt 136.5 lb

## 2015-04-29 DIAGNOSIS — C3411 Malignant neoplasm of upper lobe, right bronchus or lung: Secondary | ICD-10-CM | POA: Diagnosis present

## 2015-04-29 DIAGNOSIS — Z87891 Personal history of nicotine dependence: Secondary | ICD-10-CM | POA: Diagnosis not present

## 2015-04-29 DIAGNOSIS — E7800 Pure hypercholesterolemia, unspecified: Secondary | ICD-10-CM | POA: Diagnosis not present

## 2015-04-29 DIAGNOSIS — Z7952 Long term (current) use of systemic steroids: Secondary | ICD-10-CM | POA: Diagnosis not present

## 2015-04-29 DIAGNOSIS — C781 Secondary malignant neoplasm of mediastinum: Secondary | ICD-10-CM

## 2015-04-29 DIAGNOSIS — Z9221 Personal history of antineoplastic chemotherapy: Secondary | ICD-10-CM | POA: Diagnosis not present

## 2015-04-29 DIAGNOSIS — Z923 Personal history of irradiation: Secondary | ICD-10-CM

## 2015-04-29 DIAGNOSIS — R5383 Other fatigue: Secondary | ICD-10-CM

## 2015-04-29 DIAGNOSIS — I10 Essential (primary) hypertension: Secondary | ICD-10-CM | POA: Insufficient documentation

## 2015-04-29 DIAGNOSIS — R739 Hyperglycemia, unspecified: Secondary | ICD-10-CM | POA: Insufficient documentation

## 2015-04-29 DIAGNOSIS — R531 Weakness: Secondary | ICD-10-CM | POA: Diagnosis not present

## 2015-04-29 DIAGNOSIS — E079 Disorder of thyroid, unspecified: Secondary | ICD-10-CM | POA: Diagnosis not present

## 2015-04-29 DIAGNOSIS — C3490 Malignant neoplasm of unspecified part of unspecified bronchus or lung: Secondary | ICD-10-CM

## 2015-04-29 DIAGNOSIS — Z79899 Other long term (current) drug therapy: Secondary | ICD-10-CM | POA: Insufficient documentation

## 2015-04-29 LAB — CBC WITH DIFFERENTIAL/PLATELET
BASOS ABS: 0.1 10*3/uL (ref 0–0.1)
Basophils Relative: 1 %
EOS PCT: 3 %
Eosinophils Absolute: 0.2 10*3/uL (ref 0–0.7)
HEMATOCRIT: 36.3 % (ref 35.0–47.0)
HEMOGLOBIN: 12 g/dL (ref 12.0–16.0)
LYMPHS ABS: 1.5 10*3/uL (ref 1.0–3.6)
LYMPHS PCT: 22 %
MCH: 26.8 pg (ref 26.0–34.0)
MCHC: 33.1 g/dL (ref 32.0–36.0)
MCV: 80.8 fL (ref 80.0–100.0)
Monocytes Absolute: 0.7 10*3/uL (ref 0.2–0.9)
Monocytes Relative: 11 %
NEUTROS ABS: 4.5 10*3/uL (ref 1.4–6.5)
NEUTROS PCT: 63 %
PLATELETS: 272 10*3/uL (ref 150–440)
RBC: 4.49 MIL/uL (ref 3.80–5.20)
RDW: 14.7 % — ABNORMAL HIGH (ref 11.5–14.5)
WBC: 7 10*3/uL (ref 3.6–11.0)

## 2015-04-29 LAB — COMPREHENSIVE METABOLIC PANEL
ALT: 13 U/L — ABNORMAL LOW (ref 14–54)
ANION GAP: 7 (ref 5–15)
AST: 18 U/L (ref 15–41)
Albumin: 3.9 g/dL (ref 3.5–5.0)
Alkaline Phosphatase: 90 U/L (ref 38–126)
BILIRUBIN TOTAL: 0.5 mg/dL (ref 0.3–1.2)
BUN: 18 mg/dL (ref 6–20)
CHLORIDE: 101 mmol/L (ref 101–111)
CO2: 28 mmol/L (ref 22–32)
Calcium: 9.4 mg/dL (ref 8.9–10.3)
Creatinine, Ser: 0.95 mg/dL (ref 0.44–1.00)
GFR calc Af Amer: >60 mL/min (ref >60)
GFR, EST NON AFRICAN AMERICAN: 54 mL/min — AB (ref >60)
Glucose, Bld: 99 mg/dL (ref 65–99)
POTASSIUM: 3.8 mmol/L (ref 3.5–5.1)
Sodium: 136 mmol/L (ref 135–145)
TOTAL PROTEIN: 7.5 g/dL (ref 6.5–8.1)

## 2015-04-29 NOTE — Progress Notes (Signed)
[place] @ [facility] Telephone:(336) [phone]  Fax:(336) [place]: 1930-10-17  MR#: [id number]  XHB#:[id number]  Patient Care Team: [name], MD as PCP - General (Internal Medicine)  CHIEF COMPLAINT:  Chief Complaint  Patient presents with  . OTHER    Oncology History   patient is an 81 year old female with probable stage IV (T4 N2 M1) adenocarcinoma of the right lung with intrathoracic lower lobe metastasis as well as a T4 lung lesion with direct invasion of the mediastinum and pulmonary artery invasion.stage IV tissue  is insufficient for EGFR and  ALK MUTATION [place] Blood days is not positivefor any EGFR oor ALK mutation  2.  Starting radiation and chemotherapy from April 14, 2014 Patient was started on carboplatinum andTaxol Herve were developed an allergic reaction to Taxol so would be changed to Abraxane 3.patient has finished 6 cycles of weekly chemotherapy with carboplatinum  and radiation therapy(May 28, 2014) 4.started on  NIVOLULAMAB because of persistent disease July 02, 2014. 5.  NIVOLULAMAB was discontinued because of persistent diarrhea in July of 2016.  August of 2016 CT scan was stable so no further chemotherapy     Carcinoma of lung [facility])    Oncology Flowsheet 10/23/2014 11/06/2014 11/20/2014 12/04/2014 12/09/2014  dexamethasone (DECADRON) IV - - - - 8 mg  methylPREDNISolone sodium succinate 125 mg/2 mL (SOLU-MEDROL) IV - - - 60 mg -  nivolumab (OPDIVO) IV 200 mg 200 mg 200 mg - -    INTERVAL HISTORY: 81 year old lady who had diarrhea secondary to [facility] so was discontinued.  After prednisone therapy patient appetite improved diarrhea improved.  Patient had a repeat PET scan.  At present time no abdominal pain appetite is improving gaining energy level.  Ambulatory.  Here for further follow-up and treatment consideration.  No chills.  No fever. February 27, 2015.  Patient is here for ongoing evaluation and treatment  consideration.  Patient is off all chemotherapy Nausea no vomiting no diarrhea.  Patient is taking prednisone 10 mg every other day. Mild elevation of blood sugar.  Last PET scan was in August of 2016  Patient is here for ongoing evaluation regarding carcinoma of lung.  Appetite has been improving no cough no shortness of breath.  Patient is somewhat depressed because of her husband's illness.  But she has not lost any significant weight. REVIEW OF SYSTEMS:    general status: Patient is feeling weak and tired.  Energy level is somewhat improving No change in a performance status.  No chills.  No fever. HEENT: .  No evidence of stomatitis Lungs: No cough or shortness of breath Cardiac: No chest pain or paroxysmal nocturnal dyspnea GI: No nausea no vomiting no diarrhea no abdominal pain Diarrhea has improved. Skin: No rash Lower extremity no swelling Neurological system: No tingling.  No numbness.  No other focal signs Musculoskeletal system no bony pains  As per HPI. Otherwise, a complete review of systems is negatve.  PAST MEDICAL HISTORY: Past Medical History  Diagnosis Date  . Hypertension   . Hypercholesterolemia   . Osteoporosis   . Hypercalcemia     familial hypocalciuric hypercalcemia  . Chicken pox   . Thyroid disease   . Lung cancer ([place])     PAST SURGICAL HISTORY: Past Surgical History  Procedure Laterality Date  . Transvaginal hysterectomy  04/18/05    with anterior colporrhaphy  . Cholecystectomy    . Abdominal hysterectomy      partial  FAMILY HISTORY Family History  Problem Relation Age of Onset  . Stroke Mother   . Hypertension Mother   . Prostate cancer Father   . Cancer Father     prostate  . Heart disease Brother     s/p CABG  . Colon cancer Neg Hx     ADVANCED DIRECTIVES:   Patient does have advance healthcare directive, Patient   does not desire to make any changes HEALTH MAINTENANCE: Social History  Substance Use Topics  . Smoking  status: Former Smoker    Quit date: 06/13/1997  . Smokeless tobacco: Never Used  . Alcohol Use: No      Allergies  Allergen Reactions  . Paclitaxel Other (See Comments)    Chest tightness    Current Outpatient Prescriptions  Medication Sig Dispense Refill  . Calcium Carbonate (CALTRATE 600 PO) Take 1 tablet by mouth daily.    . cholecalciferol (VITAMIN D) 400 UNITS TABS Take 400 Units by mouth daily.    [name] nystatin cream (MYCOSTATIN) Apply 1 application topically 2 (two) times daily. 30 g 0  . olmesartan-hydrochlorothiazide (BENICAR HCT) 20-12.5 MG tablet Take 1 tablet by mouth daily. 90 tablet 3  . pantoprazole (PROTONIX) 40 MG tablet Take 1 tablet (40 mg total) by mouth daily. 30 tablet 5  . potassium chloride (K-DUR) 10 MEQ tablet Take 1 tablet (10 mEq total) by mouth daily. 15 tablet 0  . predniSONE (DELTASONE) 10 MG tablet Take 1 tablet (10 mg total) by mouth every other day. 45 tablet 0   No current facility-administered medications for this visit.   Facility-Administered Medications Ordered in Other Visits  Medication Dose Route Frequency Provider Last Rate Last Dose  . sodium chloride 0.9 % 1,000 mL with potassium chloride 20 mEq, magnesium sulfate 2 g infusion   Intravenous Continuous Forest Gleason, MD   Stopped at 12/04/14 1300  . sodium chloride 0.9 % injection 10 mL  10 mL Intracatheter PRN Forest Gleason, MD        OBJECTIVE:  Filed Vitals:   04/29/15 1143  BP: 121/67  Pulse: 79  Temp: 96.5 F (35.8 C)     Body mass index is 23.41 kg/(m^2).    ECOG FS:1 - Symptomatic but completely ambulatory  PHYSICAL EXAM: Goal status: Performance status is good.  Patient has not lost significant weight HEENT: No evidence of stomatitis. Sclera and conjunctivae :: No jaundice.   pale looking. Lungs: Air  entry equal on both sides.  No rhonchi.  No rales.  Cardiac: Heart sounds are normal.  No pericardial rub.  No murmur. Lymphatic system: Cervical, axillary, inguinal, lymph  nodes not palpable GI: Abdomen is soft.  No ascites.  Liver spleen not palpable.  No tenderness.  Bowel sounds are within normal limit Lower extremity: No edema Neurological system: Higher functions, cranial nerves intact no evidence of peripheral neuropathy. Skin: No rash.  No ecchymosis.[name]   LAB RESULTS:  CBC Latest Ref Rng 04/29/2015 03/02/2015  WBC 3.6 - 11.0 K/uL 7.0 7.3  Hemoglobin 12.0 - 16.0 g/dL 12.0 11.6(L)  Hematocrit 35.0 - 47.0 % 36.3 34.9(L)  Platelets 150 - 440 K/uL 272 296    Appointment on 04/29/2015  Component Date Value Ref Range Status  . Sodium 04/29/2015 136  135 - 145 mmol/L Final  . Potassium 04/29/2015 3.8  3.5 - 5.1 mmol/L Final  . Chloride 04/29/2015 101  101 - 111 mmol/L Final  . CO2 04/29/2015 28  22 - 32 mmol/L Final  . Glucose,  Bld 04/29/2015 99  65 - 99 mg/dL Final  . BUN 04/29/2015 18  6 - 20 mg/dL Final  . Creatinine, Ser 04/29/2015 0.95  0.44 - 1.00 mg/dL Final  . Calcium 04/29/2015 9.4  8.9 - 10.3 mg/dL Final  . Total Protein 04/29/2015 7.5  6.5 - 8.1 g/dL Final  . Albumin 04/29/2015 3.9  3.5 - 5.0 g/dL Final  . AST 04/29/2015 18  15 - 41 U/L Final  . ALT 04/29/2015 13* 14 - 54 U/L Final  . Alkaline Phosphatase 04/29/2015 90  38 - 126 U/L Final  . Total Bilirubin 04/29/2015 0.5  0.3 - 1.2 mg/dL Final  . GFR calc non Af Amer 04/29/2015 54* >60 mL/min Final  . GFR calc Af Amer 04/29/2015 >60  >60 mL/min Final   Comment: (NOTE) The eGFR has been calculated using the CKD EPI equation. This calculation has not been validated in all clinical situations. eGFR's persistently <60 mL/min signify possible Chronic Kidney Disease.   . Anion gap 04/29/2015 7  5 - 15 Final  . WBC 04/29/2015 7.0  3.6 - 11.0 K/uL Final  . RBC 04/29/2015 4.49  3.80 - 5.20 MIL/uL Final  . Hemoglobin 04/29/2015 12.0  12.0 - 16.0 g/dL Final  . HCT 04/29/2015 36.3  35.0 - 47.0 % Final  . MCV 04/29/2015 80.8  80.0 - 100.0 fL Final  . MCH 04/29/2015 26.8  26.0 - 34.0 pg Final    . MCHC 04/29/2015 33.1  32.0 - 36.0 g/dL Final  . RDW 04/29/2015 14.7* 11.5 - 14.5 % Final  . Platelets 04/29/2015 272  150 - 440 K/uL Final  . Neutrophils Relative % 04/29/2015 63   Final  . Neutro Abs 04/29/2015 4.5  1.4 - 6.5 K/uL Final  . Lymphocytes Relative 04/29/2015 22   Final  . Lymphs Abs 04/29/2015 1.5  1.0 - 3.6 K/uL Final  . Monocytes Relative 04/29/2015 11   Final  . Monocytes Absolute 04/29/2015 0.7  0.2 - 0.9 K/uL Final  . Eosinophils Relative 04/29/2015 3   Final  . Eosinophils Absolute 04/29/2015 0.2  0 - 0.7 K/uL Final  . Basophils Relative 04/29/2015 1   Final  . Basophils Absolute 04/29/2015 0.1  0 - 0.1 K/uL Final        ASSESSMENT: Carcinoma of lung stage IV disease Patient is off NIVOLULAMAB Appetite is improved Repeat PET scan in 3 months for reevaluation Symptomatically patient has been stable On clinical ground there is no evidence of recurrent and progressive disease      Patient expressed understanding and was in agreement with this plan. She also understands that She can call clinic at any time with any questions, concerns, or complaints.    No matching staging information was found for the patient.  Forest Gleason, MD   04/29/2015 11:53 AM

## 2015-05-05 ENCOUNTER — Encounter: Payer: Self-pay | Admitting: Oncology

## 2015-05-18 ENCOUNTER — Ambulatory Visit (INDEPENDENT_AMBULATORY_CARE_PROVIDER_SITE_OTHER): Payer: Medicare Other | Admitting: Internal Medicine

## 2015-05-18 ENCOUNTER — Encounter: Payer: Self-pay | Admitting: Internal Medicine

## 2015-05-18 VITALS — BP 120/60 | HR 77 | Temp 97.7°F | Resp 18 | Ht 63.25 in | Wt 140.0 lb

## 2015-05-18 DIAGNOSIS — M810 Age-related osteoporosis without current pathological fracture: Secondary | ICD-10-CM | POA: Diagnosis not present

## 2015-05-18 DIAGNOSIS — K219 Gastro-esophageal reflux disease without esophagitis: Secondary | ICD-10-CM | POA: Diagnosis not present

## 2015-05-18 DIAGNOSIS — I10 Essential (primary) hypertension: Secondary | ICD-10-CM

## 2015-05-18 DIAGNOSIS — R739 Hyperglycemia, unspecified: Secondary | ICD-10-CM

## 2015-05-18 DIAGNOSIS — E7800 Pure hypercholesterolemia, unspecified: Secondary | ICD-10-CM

## 2015-05-18 DIAGNOSIS — C3490 Malignant neoplasm of unspecified part of unspecified bronchus or lung: Secondary | ICD-10-CM

## 2015-05-18 MED ORDER — FLUTICASONE PROPIONATE 50 MCG/ACT NA SUSP: 2.0000 | Freq: Every day | NASAL | Status: DC

## 2015-05-18 NOTE — Progress Notes (Signed)
Patient ID: Heather Cooper, female   DOB: 03-12-1931, 81 y.o.   MRN: [id number]   Subjective:    Patient ID: Heather Cooper, female    DOB: 05-06-1931, 81 y.o.   MRN: [id number]  HPI  Patient with past history of hypertension, lung cancer, GERD, hypercalcemia and hyperglycemia.  She comes in today to follow up on these issues.  She is doing well.  Feels good.  Eating well.  Husband is doing better.  Handling stress.  Good family support.  Stays active.  Breathing stable.  No nausea or vomiting.  Bowels stable.  Some allergy symptoms.  Uses flonase.  Works for her.    Past Medical History  Diagnosis Date  . Hypertension   . Hypercholesterolemia   . Osteoporosis   . Hypercalcemia     familial hypocalciuric hypercalcemia  . Chicken pox   . Thyroid disease   . Lung cancer [facility])    Past Surgical History  Procedure Laterality Date  . Transvaginal hysterectomy  04/18/05    with anterior colporrhaphy  . Cholecystectomy    . Abdominal hysterectomy      partial   Family History  Problem Relation Age of Onset  . Stroke Mother   . Hypertension Mother   . Prostate cancer Father   . Cancer Father     prostate  . Heart disease Brother     s/p CABG  . Colon cancer Neg Hx    Social History   Social History  . Marital Status: Married    Spouse Name: N/A  . Number of Children: 3  . Years of Education: N/A   Social History Main Topics  . Smoking status: Former Smoker    Quit date: 06/13/1997  . Smokeless tobacco: Never Used  . Alcohol Use: No  . Drug Use: No  . Sexual Activity: Not Asked   Other Topics Concern  . None   Social History Narrative    Outpatient Encounter Prescriptions as of 05/18/2015  Medication Sig  . cholecalciferol (VITAMIN D) 400 UNITS TABS Take 400 Units by mouth daily.  [name] olmesartan-hydrochlorothiazide (BENICAR HCT) 20-12.5 MG tablet Take 1 tablet by mouth daily.  . pantoprazole (PROTONIX) 40 MG tablet Take 1 tablet (40 mg total) by mouth daily.  .  potassium chloride (K-DUR) 10 MEQ tablet Take 1 tablet (10 mEq total) by mouth daily.  . predniSONE (DELTASONE) 10 MG tablet Take 10 mg by mouth every other day.  . [DISCONTINUED] predniSONE (DELTASONE) 10 MG tablet Take 1 tablet (10 mg total) by mouth every other day.  . fluticasone (FLONASE) 50 MCG/ACT nasal spray Place 2 sprays into both nostrils daily.  [name] nystatin cream (MYCOSTATIN) Apply 1 application topically 2 (two) times daily. (Patient not taking: Reported on 05/18/2015)  . [DISCONTINUED] Calcium Carbonate (CALTRATE 600 PO) Take 1 tablet by mouth daily.   Facility-Administered Encounter Medications as of 05/18/2015  Medication  . sodium chloride 0.9 % 1,000 mL with potassium chloride 20 mEq, magnesium sulfate 2 g infusion  . sodium chloride 0.9 % injection 10 mL    Review of Systems  Constitutional: Negative for appetite change and unexpected weight change.  HENT: Negative for congestion and sinus pressure.   Respiratory: Negative for cough, chest tightness and shortness of breath.   Cardiovascular: Negative for chest pain, palpitations and leg swelling.  Gastrointestinal: Negative for nausea, vomiting, abdominal pain and diarrhea.  Genitourinary: Negative for dysuria and difficulty urinating.  Musculoskeletal: Negative for back pain and joint swelling.  Skin: Negative for color change and rash.  Neurological: Negative for dizziness, light-headedness and headaches.  Psychiatric/Behavioral: Negative for dysphoric mood and agitation.       Objective:    Physical Exam  Constitutional: She appears well-developed and well-nourished. No distress.  HENT:  Nose: Nose normal.  Mouth/Throat: Oropharynx is clear and moist.  Eyes: Conjunctivae are normal. Right eye exhibits no discharge. Left eye exhibits no discharge.  Neck: Neck supple. No thyromegaly present.  Cardiovascular: Normal rate and regular rhythm.   Pulmonary/Chest: Breath sounds normal. No respiratory distress. She has  no wheezes.  Abdominal: Soft. Bowel sounds are normal. There is no tenderness.  Musculoskeletal: She exhibits no edema or tenderness.  Lymphadenopathy:    She has no cervical adenopathy.  Skin: No rash noted. No erythema.  Psychiatric: She has a normal mood and affect. Her behavior is normal.    BP 120/60 mmHg  Pulse 77  Temp(Src) 97.7 F (36.5 C) (Oral)  Resp 18  Ht 5' 3.25" (1.607 m)  Wt 140 lb (63.504 kg)  BMI 24.59 kg/m2  SpO2 98% Wt Readings from Last 3 Encounters:  05/18/15 140 lb (63.504 kg)  04/29/15 136 lb 7.4 oz (61.9 kg)  03/02/15 137 lb 9.1 oz (62.4 kg)     Lab Results  Component Value Date   WBC 7.0 04/29/2015   HGB 12.0 04/29/2015   HCT 36.3 04/29/2015   PLT 272 04/29/2015   GLUCOSE 99 04/29/2015   CHOL 155 03/03/2014   TRIG 117.0 03/03/2014   HDL 36.20* 03/03/2014   LDLCALC 95 03/03/2014   ALT 13* 04/29/2015   AST 18 04/29/2015   NA 136 04/29/2015   K 3.8 04/29/2015   CL 101 04/29/2015   CREATININE 0.95 04/29/2015   BUN 18 04/29/2015   CO2 28 04/29/2015   TSH 0.42 01/31/2013   INR 1.0 03/18/2014   INR 1.0 03/18/2014   HGBA1C 6.3 03/03/2014    Nm Pet Image Restag (ps) Skull Base To Thigh  01/15/2015  CLINICAL DATA:  Subsequent treatment strategy for lung carcinoma. EXAM: NUCLEAR MEDICINE PET SKULL BASE TO THIGH TECHNIQUE: 11.8 mCi F-18 FDG was injected intravenously. Full-ring PET imaging was performed from the skull base to thigh after the radiotracer. CT data was obtained and used for attenuation correction and anatomic localization. FASTING BLOOD GLUCOSE:  Value: 131 mg/dl COMPARISON:  PET-CT scan 10/07/2014 FINDINGS: NECK No hypermetabolic lymph nodes in the neck. CHEST Nodule or thickening along the RIGHT upper mediastinum measuring 15 mm similar to 18 mm and does not have metabolic activity (image 69, series 3 )mild metabolic activity associated with the curvilinear thickening in the RIGHT middle lobe likely represents postradiation inflammation.  7 mm RIGHT lower lobe pulmonary nodule on image 91, series 3 does not have metabolic activity. There is a mildly hypermetabolic RIGHT lower paratracheal lymph node and subcarinal lymph node with SUV max equal 4.5 and 4.0 respectively. These small lymph nodes are similar to comparison exam. ABDOMEN/PELVIS No abnormal hypermetabolic activity within the liver, pancreas, adrenal glands, or spleen. No hypermetabolic lymph nodes in the abdomen or pelvis. LEFT colon diverticulosis without diverticulitis. Infrarenal abdominal aortic aneurysm measures 3.4 cm. SKELETON No focal hypermetabolic activity to suggest skeletal metastasis. IMPRESSION: 1. Curvilinear thickening and nodularity in the RIGHT upper lobe and RIGHT middle lobe without significant metabolic activity is most consistent with postradiation change. 2. Stable RIGHT lower lobe pulmonary nodule. 3. Small mildly hypermetabolic mediastinal lymph nodes are similar to comparison exam. These mediastinal lymph nodes  are indeterminate. 4. No evidence of metastasis in the abdomen or pelvis. 5. Stable infrarenal abdominal aortic aneurysm Electronically Signed   By: Suzy Bouchard M.D.   On: 01/15/2015 12:36       Assessment & Plan:   Problem List Items Addressed This Visit    Carcinoma of lung ([place])    Followed by Dr Oliva Bustard.  After last appt, recommended f/u PET in 3 months.  Breathing stable.        Relevant Medications   predniSONE (DELTASONE) 10 MG tablet   GERD (gastroesophageal reflux disease)    Symptom controlled on protonix.  Follow.        Hypercalcemia    Followed by Dr Forde Dandy.       Relevant Orders   TSH   Hypercholesterolemia    Low cholesterol diet and exercise.  Follow lipid panel.        Relevant Orders   Lipid panel   Hepatic function panel   Hyperglycemia    Low carb diet and exercise.  Follow met b and a1c.       Relevant Orders   Hemoglobin A1c   Hypertension - Primary    Blood pressure under good control.  Continue  same medication regimen.  Follow pressures.  Follow metabolic panel.        Relevant Orders   Basic metabolic panel   Osteoporosis    Taking vitamin D supplements.  Declines further treatment.           [name], MD

## 2015-05-18 NOTE — Progress Notes (Signed)
Pre-visit discussion using our clinic review tool. No additional management support is needed unless otherwise documented below in the visit note.  

## 2015-05-24 ENCOUNTER — Encounter: Payer: Self-pay | Admitting: Internal Medicine

## 2015-05-24 NOTE — Assessment & Plan Note (Signed)
Followed by Dr South.   

## 2015-05-24 NOTE — Assessment & Plan Note (Signed)
Symptom controlled on protonix.  Follow.

## 2015-05-24 NOTE — Assessment & Plan Note (Signed)
Blood pressure under good control.  Continue same medication regimen.  Follow pressures.  Follow metabolic panel.   

## 2015-05-24 NOTE — Assessment & Plan Note (Signed)
Taking vitamin D supplements.  Declines further treatment.

## 2015-05-24 NOTE — Assessment & Plan Note (Signed)
Low carb diet and exercise.  Follow met b and a1c.  

## 2015-05-24 NOTE — Assessment & Plan Note (Signed)
Low cholesterol diet and exercise.  Follow lipid panel.   

## 2015-05-24 NOTE — Assessment & Plan Note (Signed)
Followed by Dr Oliva Bustard.  After last appt, recommended f/u PET in 3 months.  Breathing stable.

## 2015-05-26 ENCOUNTER — Other Ambulatory Visit (INDEPENDENT_AMBULATORY_CARE_PROVIDER_SITE_OTHER): Payer: Medicare Other

## 2015-05-26 DIAGNOSIS — R739 Hyperglycemia, unspecified: Secondary | ICD-10-CM

## 2015-05-26 DIAGNOSIS — E7800 Pure hypercholesterolemia, unspecified: Secondary | ICD-10-CM

## 2015-05-26 DIAGNOSIS — I10 Essential (primary) hypertension: Secondary | ICD-10-CM

## 2015-05-26 LAB — LIPID PANEL
CHOL/HDL RATIO: 3
Cholesterol: 168 mg/dL (ref 0–200)
HDL: 49.8 mg/dL (ref >39.00)
LDL Cholesterol: 92 mg/dL (ref 0–99)
NONHDL: 118.39
Triglycerides: 130 mg/dL (ref 0.0–149.0)
VLDL: 26 mg/dL (ref 0.0–40.0)

## 2015-05-26 LAB — HEPATIC FUNCTION PANEL
ALK PHOS: 101 U/L (ref 39–117)
ALT: 9 U/L (ref 0–35)
AST: 13 U/L (ref 0–37)
Albumin: 3.9 g/dL (ref 3.5–5.2)
BILIRUBIN DIRECT: 0.1 mg/dL (ref 0.0–0.3)
Total Bilirubin: 0.3 mg/dL (ref 0.2–1.2)
Total Protein: 6.9 g/dL (ref 6.0–8.3)

## 2015-05-26 LAB — BASIC METABOLIC PANEL
BUN: 15 mg/dL (ref 6–23)
CHLORIDE: 104 meq/L (ref 96–112)
CO2: 31 meq/L (ref 19–32)
CREATININE: 0.83 mg/dL (ref 0.40–1.20)
Calcium: 9.6 mg/dL (ref 8.4–10.5)
GFR: 84.56 mL/min (ref >60.00)
Glucose, Bld: 118 mg/dL — ABNORMAL HIGH (ref 70–99)
Potassium: 3.9 mEq/L (ref 3.5–5.1)
Sodium: 141 mEq/L (ref 135–145)

## 2015-05-26 LAB — TSH: TSH: 0.42 u[IU]/mL (ref 0.35–4.50)

## 2015-05-26 LAB — HEMOGLOBIN A1C: Hgb A1c MFr Bld: 6.7 % — ABNORMAL HIGH (ref 4.6–6.5)

## 2015-05-27 ENCOUNTER — Encounter: Payer: Self-pay | Admitting: *Deleted

## 2015-06-24 ENCOUNTER — Other Ambulatory Visit: Payer: Self-pay | Admitting: Internal Medicine

## 2015-07-20 ENCOUNTER — Other Ambulatory Visit: Payer: Self-pay | Admitting: *Deleted

## 2015-07-20 MED ORDER — PREDNISONE 10 MG PO TABS: 10.0000 mg | ORAL_TABLET | ORAL | Status: DC

## 2015-08-04 ENCOUNTER — Ambulatory Visit
Admission: RE | Admit: 2015-08-04 | Discharge: 2015-08-04 | Disposition: A | Discharge status: Home / Self Care | Payer: Medicare Other | Source: Ambulatory Visit | Attending: Oncology | Admitting: Oncology

## 2015-08-04 DIAGNOSIS — I714 Abdominal aortic aneurysm, without rupture: Secondary | ICD-10-CM | POA: Diagnosis not present

## 2015-08-04 DIAGNOSIS — K5730 Diverticulosis of large intestine without perforation or abscess without bleeding: Secondary | ICD-10-CM | POA: Diagnosis not present

## 2015-08-04 DIAGNOSIS — I2510 Atherosclerotic heart disease of native coronary artery without angina pectoris: Secondary | ICD-10-CM | POA: Insufficient documentation

## 2015-08-04 DIAGNOSIS — C3490 Malignant neoplasm of unspecified part of unspecified bronchus or lung: Secondary | ICD-10-CM

## 2015-08-04 LAB — GLUCOSE, CAPILLARY: Glucose-Capillary: 101 mg/dL — ABNORMAL HIGH (ref 65–99)

## 2015-08-04 MED ORDER — FLUDEOXYGLUCOSE F - 18 (FDG) INJECTION
12.6500 | Freq: Once | INTRAVENOUS | Status: AC | PRN
  Administered 2015-08-04: 12.65 via INTRAVENOUS

## 2015-08-06 ENCOUNTER — Inpatient Hospital Stay: Payer: Medicare Other

## 2015-08-06 ENCOUNTER — Inpatient Hospital Stay: Payer: Medicare Other | Admitting: Oncology

## 2015-08-06 ENCOUNTER — Inpatient Hospital Stay (HOSPITAL_BASED_OUTPATIENT_CLINIC_OR_DEPARTMENT_OTHER): Payer: Medicare Other | Admitting: Oncology

## 2015-08-06 ENCOUNTER — Encounter: Payer: Self-pay | Admitting: Oncology

## 2015-08-06 ENCOUNTER — Inpatient Hospital Stay: Payer: Medicare Other | Attending: Oncology

## 2015-08-06 VITALS — BP 169/73 | HR 91 | Temp 97.2°F | Resp 18 | Wt 152.2 lb

## 2015-08-06 DIAGNOSIS — Z9221 Personal history of antineoplastic chemotherapy: Secondary | ICD-10-CM | POA: Diagnosis not present

## 2015-08-06 DIAGNOSIS — C3411 Malignant neoplasm of upper lobe, right bronchus or lung: Secondary | ICD-10-CM | POA: Diagnosis not present

## 2015-08-06 DIAGNOSIS — Z7952 Long term (current) use of systemic steroids: Secondary | ICD-10-CM | POA: Insufficient documentation

## 2015-08-06 DIAGNOSIS — I10 Essential (primary) hypertension: Secondary | ICD-10-CM | POA: Diagnosis not present

## 2015-08-06 DIAGNOSIS — E7800 Pure hypercholesterolemia, unspecified: Secondary | ICD-10-CM

## 2015-08-06 DIAGNOSIS — R531 Weakness: Secondary | ICD-10-CM | POA: Diagnosis not present

## 2015-08-06 DIAGNOSIS — R5381 Other malaise: Secondary | ICD-10-CM | POA: Diagnosis not present

## 2015-08-06 DIAGNOSIS — Z79899 Other long term (current) drug therapy: Secondary | ICD-10-CM | POA: Diagnosis not present

## 2015-08-06 DIAGNOSIS — Z87891 Personal history of nicotine dependence: Secondary | ICD-10-CM | POA: Insufficient documentation

## 2015-08-06 DIAGNOSIS — Z923 Personal history of irradiation: Secondary | ICD-10-CM | POA: Insufficient documentation

## 2015-08-06 DIAGNOSIS — C781 Secondary malignant neoplasm of mediastinum: Secondary | ICD-10-CM | POA: Diagnosis not present

## 2015-08-06 DIAGNOSIS — C3490 Malignant neoplasm of unspecified part of unspecified bronchus or lung: Secondary | ICD-10-CM

## 2015-08-06 DIAGNOSIS — E079 Disorder of thyroid, unspecified: Secondary | ICD-10-CM | POA: Diagnosis not present

## 2015-08-06 DIAGNOSIS — I728 Aneurysm of other specified arteries: Secondary | ICD-10-CM | POA: Insufficient documentation

## 2015-08-06 DIAGNOSIS — Z8042 Family history of malignant neoplasm of prostate: Secondary | ICD-10-CM | POA: Diagnosis not present

## 2015-08-06 LAB — COMPREHENSIVE METABOLIC PANEL
ALT: 13 U/L — AB (ref 14–54)
ANION GAP: 7 (ref 5–15)
AST: 15 U/L (ref 15–41)
Albumin: 4 g/dL (ref 3.5–5.0)
Alkaline Phosphatase: 79 U/L (ref 38–126)
BUN: 17 mg/dL (ref 6–20)
CHLORIDE: 104 mmol/L (ref 101–111)
CO2: 26 mmol/L (ref 22–32)
Calcium: 9.2 mg/dL (ref 8.9–10.3)
Creatinine, Ser: 0.9 mg/dL (ref 0.44–1.00)
GFR calc non Af Amer: 58 mL/min — ABNORMAL LOW (ref >60)
Glucose, Bld: 158 mg/dL — ABNORMAL HIGH (ref 65–99)
POTASSIUM: 3.9 mmol/L (ref 3.5–5.1)
SODIUM: 137 mmol/L (ref 135–145)
Total Bilirubin: 0.5 mg/dL (ref 0.3–1.2)
Total Protein: 7.1 g/dL (ref 6.5–8.1)

## 2015-08-06 LAB — CBC WITH DIFFERENTIAL/PLATELET
BASOS ABS: 0 10*3/uL (ref 0–0.1)
BASOS PCT: 1 %
Eosinophils Absolute: 0.2 10*3/uL (ref 0–0.7)
Eosinophils Relative: 3 %
HEMATOCRIT: 35.2 % (ref 35.0–47.0)
HEMOGLOBIN: 11.9 g/dL — AB (ref 12.0–16.0)
LYMPHS PCT: 22 %
Lymphs Abs: 1.6 10*3/uL (ref 1.0–3.6)
MCH: 28 pg (ref 26.0–34.0)
MCHC: 33.8 g/dL (ref 32.0–36.0)
MCV: 82.9 fL (ref 80.0–100.0)
MONO ABS: 0.5 10*3/uL (ref 0.2–0.9)
MONOS PCT: 7 %
NEUTROS ABS: 4.9 10*3/uL (ref 1.4–6.5)
NEUTROS PCT: 67 %
PLATELETS: 238 10*3/uL (ref 150–440)
RBC: 4.25 MIL/uL (ref 3.80–5.20)
RDW: 15 % — ABNORMAL HIGH (ref 11.5–14.5)
WBC: 7.2 10*3/uL (ref 3.6–11.0)

## 2015-08-06 MED ORDER — PREDNISONE 10 MG PO TABS: ORAL_TABLET | ORAL | Status: DC

## 2015-08-06 NOTE — Progress Notes (Signed)
Heather Cooper @ [facility] [phone]) [phone]  Fax:([phone]) [place]: [birth year]  MR#: [id number]  GNF#:[id number]  Patient Care Team: [name], MD as PCP - General (Internal Medicine)  CHIEF COMPLAINT:  Chief Complaint  Patient presents with  . Lung Cancer    Oncology History   patient is an 82 year old female with probable stage IV (T4 N2 M1) adenocarcinoma of the right lung with intrathoracic lower lobe metastasis as well as a T4 lung lesion with direct invasion of the mediastinum and pulmonary artery invasion.stage IV tissue  is insufficient for EGFR and  ALK MUTATION [place] Blood days is not positivefor any EGFR oor ALK mutation  2.  Starting radiation and chemotherapy from April 14, 2014 Patient was started on carboplatinum andTaxol Herve were developed an allergic reaction to Taxol so would be changed to Abraxane 3.patient has finished 6 cycles of weekly chemotherapy with carboplatinum  and radiation therapy(May 28, 2014) 4.started on  NIVOLULAMAB because of persistent disease July 02, 2014. 5.  NIVOLULAMAB was discontinued because of persistent diarrhea in July of 2016.  August of 2016 CT scan was stable so no further chemotherapy     Carcinoma of lung [facility])    Oncology Flowsheet 10/23/2014 11/06/2014 11/20/2014 12/04/2014 12/09/2014  dexamethasone (DECADRON) IV - - - - 8 mg  methylPREDNISolone sodium succinate 125 mg/2 mL (SOLU-MEDROL) IV - - - 60 mg -  nivolumab (OPDIVO) IV 200 mg 200 mg 200 mg - -    INTERVAL HISTORY: 82 year old lady who had diarrhea secondary to [facility] so was discontinued.  After prednisone therapy patient appetite improved diarrhea improved.  Patient had a repeat PET scan.  At present time no abdominal pain appetite is improving gaining energy level.  Ambulatory.  Here for further follow-up and treatment consideration.  No chills.  No fever. February 27, 2015.  Patient is here for ongoing evaluation and  treatment consideration.  Patient is off all chemotherapy Patient is here for further evaluation and had a PET scan done.  Patient is taking prednisone 10 mg Monday Wednesday and Friday.  Appetite has improved patient started gaining weight.  Her cough no shortness of breath.  Here for further evaluation and review of PET scan for carcinoma of lung stage IV disease   Patient is here for ongoing evaluation regarding carcinoma of lung.  Appetite has been improving no cough no shortness of breath.  Patient is somewhat depressed because of her husband's illness.  But she has not lost any significant weight. REVIEW OF SYSTEMS:    general status: Patient is feeling weak and tired.  Energy level is somewhat improving No change in a performance status.  No chills.  No fever. HEENT: .  No evidence of stomatitis Lungs: No cough or shortness of breath Cardiac: No chest pain or paroxysmal nocturnal dyspnea GI: No nausea no vomiting no diarrhea no abdominal pain Diarrhea has improved. Skin: No rash Lower extremity no swelling Neurological system: No tingling.  No numbness.  No other focal signs Musculoskeletal system no bony pains  As per HPI. Otherwise, a complete review of systems is negatve.  PAST MEDICAL HISTORY: Past Medical History  Diagnosis Date  . Hypertension   . Hypercholesterolemia   . Osteoporosis   . Hypercalcemia     familial hypocalciuric hypercalcemia  . Chicken pox   . Thyroid disease   . Lung cancer ([place])     PAST SURGICAL HISTORY: Past Surgical History  Procedure Laterality Date  .  Transvaginal hysterectomy  04/18/05    with anterior colporrhaphy  . Cholecystectomy    . Abdominal hysterectomy      partial    FAMILY HISTORY Family History  Problem Relation Age of Onset  . Stroke Mother   . Hypertension Mother   . Prostate cancer Father   . Cancer Father     prostate  . Heart disease Brother     s/p CABG  . Colon cancer Neg Hx     ADVANCED DIRECTIVES:    Patient does have advance healthcare directive, Patient   does not desire to make any changes HEALTH MAINTENANCE: Social History  Substance Use Topics  . Smoking status: Former Smoker    Quit date: 06/13/1997  . Smokeless tobacco: Never Used  . Alcohol Use: No      Allergies  Allergen Reactions  . Paclitaxel Other (See Comments)    Chest tightness    Current Outpatient Prescriptions  Medication Sig Dispense Refill  . cholecalciferol (VITAMIN D) 400 UNITS TABS Take 400 Units by mouth daily.    . fluticasone (FLONASE) 50 MCG/ACT nasal spray Place 2 sprays into both nostrils daily. 16 g 6  . nystatin cream (MYCOSTATIN) Apply 1 application topically 2 (two) times daily. 30 g 0  . olmesartan-hydrochlorothiazide (BENICAR HCT) 20-12.5 MG tablet Take 1 tablet by mouth daily. 90 tablet 3  . pantoprazole (PROTONIX) 40 MG tablet TAKE 1 TABLET BY MOUTH ONCE DAILY. 30 tablet 5  . potassium chloride (K-DUR) 10 MEQ tablet Take 1 tablet (10 mEq total) by mouth daily. 15 tablet 0  . predniSONE (DELTASONE) 10 MG tablet Take 1 tablet every Tuesday and Thursday for 2 weeks then stop     No current facility-administered medications for this visit.   Facility-Administered Medications Ordered in Other Visits  Medication Dose Route Frequency Provider Last Rate Last Dose  . sodium chloride 0.9 % 1,000 mL with potassium chloride 20 mEq, magnesium sulfate 2 g infusion   Intravenous Continuous Johney Maine, MD   Stopped at 12/04/14 1300  . sodium chloride 0.9 % injection 10 mL  10 mL Intracatheter PRN Johney Maine, MD        OBJECTIVE:  Filed Vitals:   08/06/15 1012  BP: 169/73  Pulse: 91  Temp: 97.2 F (36.2 C)  Resp: 18     Body mass index is 26.74 kg/(m^2).    ECOG FS:1 - Symptomatic but completely ambulatory  PHYSICAL EXAM: Goal status: Performance status is good.  Patient has not lost significant weight HEENT: No evidence of stomatitis. Sclera and conjunctivae :: No jaundice.   pale  looking. Lungs: Air  entry equal on both sides.  No rhonchi.  No rales.  Cardiac: Heart sounds are normal.  No pericardial rub.  No murmur. Lymphatic system: Cervical, axillary, inguinal, lymph nodes not palpable GI: Abdomen is soft.  No ascites.  Liver spleen not palpable.  No tenderness.  Bowel sounds are within normal limit Lower extremity: No edema Neurological system: Higher functions, cranial nerves intact no evidence of peripheral neuropathy. Skin: No rash.  No ecchymosis.[name]   LAB RESULTS:  CBC Latest Ref Rng 08/06/2015 04/29/2015  WBC 3.6 - 11.0 K/uL 7.2 7.0  Hemoglobin 12.0 - 16.0 g/dL 11.9(L) 12.0  Hematocrit 35.0 - 47.0 % 35.2 36.3  Platelets 150 - 440 K/uL 238 272    Appointment on 08/06/2015  Component Date Value Ref Range Status  . WBC 08/06/2015 7.2  3.6 - 11.0 K/uL Final  . RBC 08/06/2015  4.25  3.80 - 5.20 MIL/uL Final  . Hemoglobin 08/06/2015 11.9* 12.0 - 16.0 g/dL Final  . HCT 08/06/2015 35.2  35.0 - 47.0 % Final  . MCV 08/06/2015 82.9  80.0 - 100.0 fL Final  . MCH 08/06/2015 28.0  26.0 - 34.0 pg Final  . MCHC 08/06/2015 33.8  32.0 - 36.0 g/dL Final  . RDW 08/06/2015 15.0* 11.5 - 14.5 % Final  . Platelets 08/06/2015 238  150 - 440 K/uL Final  . Neutrophils Relative % 08/06/2015 67   Final  . Neutro Abs 08/06/2015 4.9  1.4 - 6.5 K/uL Final  . Lymphocytes Relative 08/06/2015 22   Final  . Lymphs Abs 08/06/2015 1.6  1.0 - 3.6 K/uL Final  . Monocytes Relative 08/06/2015 7   Final  . Monocytes Absolute 08/06/2015 0.5  0.2 - 0.9 K/uL Final  . Eosinophils Relative 08/06/2015 3   Final  . Eosinophils Absolute 08/06/2015 0.2  0 - 0.7 K/uL Final  . Basophils Relative 08/06/2015 1   Final  . Basophils Absolute 08/06/2015 0.0  0 - 0.1 K/uL Final  . Sodium 08/06/2015 137  135 - 145 mmol/L Final  . Potassium 08/06/2015 3.9  3.5 - 5.1 mmol/L Final  . Chloride 08/06/2015 104  101 - 111 mmol/L Final  . CO2 08/06/2015 26  22 - 32 mmol/L Final  . Glucose, Bld 08/06/2015 158*  65 - 99 mg/dL Final  . BUN 08/06/2015 17  6 - 20 mg/dL Final  . Creatinine, Ser 08/06/2015 0.90  0.44 - 1.00 mg/dL Final  . Calcium 08/06/2015 9.2  8.9 - 10.3 mg/dL Final  . Total Protein 08/06/2015 7.1  6.5 - 8.1 g/dL Final  . Albumin 08/06/2015 4.0  3.5 - 5.0 g/dL Final  . AST 08/06/2015 15  15 - 41 U/L Final  . ALT 08/06/2015 13* 14 - 54 U/L Final  . Alkaline Phosphatase 08/06/2015 79  38 - 126 U/L Final  . Total Bilirubin 08/06/2015 0.5  0.3 - 1.2 mg/dL Final  . GFR calc non Af Amer 08/06/2015 58* >60 mL/min Final  . GFR calc Af Amer 08/06/2015 >60  >60 mL/min Final   Comment: (NOTE) The eGFR has been calculated using the CKD EPI equation. This calculation has not been validated in all clinical situations. eGFR's persistently <60 mL/min signify possible Chronic Kidney Disease.   Georgiann Hahn gap 08/06/2015 7  5 - 15 Final  Hospital Outpatient Visit on 08/04/2015  Component Date Value Ref Range Status  . Glucose-Capillary 08/04/2015 101* 65 - 99 mg/dL Final        ASSESSMENT: Carcinoma of lung stage IV disease PET scan has been reviewed independently and so significant response. PET scan also has been reviewed with patient and family today Was found to have infrarenal fusiform 4 cm aneurysm which needs to be followed as family is extremely concerned about its patient has been referred to vascular surgeon for evaluation.  No further therapy has been recommended patient was advised to decrease prednisone to twice a week every Tuesday and Thursday for 2 weeks and then discontinue reevaluation in 3 months with lab data. Nonfasting blood sugar is slightly elevated       Patient expressed understanding and was in agreement with this plan. She also understands that She can call clinic at any time with any questions, concerns, or complaints.    No matching staging information was found for the patient.  Forest Gleason, MD   08/06/2015 2:24 PM

## 2015-08-20 ENCOUNTER — Encounter: Payer: Self-pay | Admitting: Internal Medicine

## 2015-08-20 DIAGNOSIS — I714 Abdominal aortic aneurysm, without rupture, unspecified: Secondary | ICD-10-CM | POA: Insufficient documentation

## 2015-08-30 IMAGING — CT NM PET TUM IMG RESTAG (PS) SKULL BASE T - THIGH
1 of 10 series · 1 of 25 positions shown · non-contrast
Comparison: PET-CT scan 10/07/2014

CLINICAL DATA: Subsequent treatment strategy for lung carcinoma.

EXAM:
NUCLEAR MEDICINE PET SKULL BASE TO THIGH
TECHNIQUE: 11.8 mCi F-18 FDG was injected intravenously. Full-ring PET imaging
was performed from the skull base to thigh after the radiotracer. CT
data was obtained and used for attenuation correction and anatomic
localization.
FASTING BLOOD GLUCOSE:  Value: 131 mg/dl

[Series 3: ct wb 5.0 b30f · axial · 5.0mm · 0.98mm/px · 1 of 290 slices shown]
[im 290/290  brain]
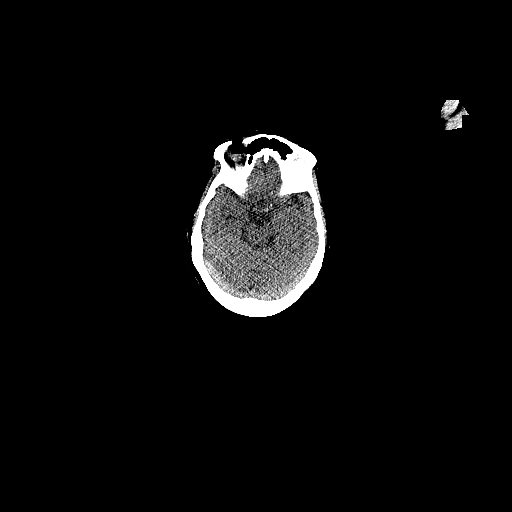

[1 of 25 positions shown; findings below may reference images not displayed]

FINDINGS: NECK

No hypermetabolic lymph nodes in the neck.

CHEST

Nodule or thickening along the RIGHT upper mediastinum measuring 15
mm similar to 18 mm and does not have metabolic activity (image 69,
series 3 )mild metabolic activity associated with the curvilinear
thickening in the RIGHT middle lobe likely represents postradiation
inflammation. 7 mm RIGHT lower lobe pulmonary nodule on image 91,
series 3 does not have metabolic activity.

There is a mildly hypermetabolic RIGHT lower paratracheal lymph node
and subcarinal lymph node with SUV max equal 4.5 and
respectively. These small lymph nodes are similar to comparison
exam.

ABDOMEN/PELVIS

No abnormal hypermetabolic activity within the liver, pancreas,
adrenal glands, or spleen. No hypermetabolic lymph nodes in the
abdomen or pelvis. LEFT colon diverticulosis without diverticulitis.
Infrarenal abdominal aortic aneurysm measures 3.4 cm.

SKELETON

No focal hypermetabolic activity to suggest skeletal metastasis.
IMPRESSION: 1. Curvilinear thickening and nodularity in the RIGHT upper lobe and
RIGHT middle lobe without significant metabolic activity is most
consistent with postradiation change.
2. Stable RIGHT lower lobe pulmonary nodule.
3. Small mildly hypermetabolic mediastinal lymph nodes are similar
to comparison exam. These mediastinal lymph nodes are indeterminate.
4. No evidence of metastasis in the abdomen or pelvis.
5. Stable infrarenal abdominal aortic aneurysm

## 2015-10-26 ENCOUNTER — Telehealth: Payer: Self-pay | Admitting: Internal Medicine

## 2015-10-26 MED ORDER — NYSTATIN 100000 UNIT/GM EX CREA: 1.0000 "application " | TOPICAL_CREAM | Freq: Two times a day (BID) | CUTANEOUS | Status: DC

## 2015-10-26 NOTE — Telephone Encounter (Signed)
Refilled. thanks

## 2015-10-26 NOTE — Telephone Encounter (Signed)
Pt called about needing a Rx for nystatin cream (MYCOSTATIN). Pt ran out. Pharmacy is [facility] [place]. Call pt @ [phone]. Thank you!

## 2015-11-05 ENCOUNTER — Encounter: Payer: Self-pay | Admitting: Oncology

## 2015-11-05 ENCOUNTER — Inpatient Hospital Stay (HOSPITAL_BASED_OUTPATIENT_CLINIC_OR_DEPARTMENT_OTHER): Payer: Medicare Other | Admitting: Oncology

## 2015-11-05 ENCOUNTER — Inpatient Hospital Stay: Payer: Medicare Other | Attending: Oncology

## 2015-11-05 VITALS — BP 138/71 | HR 87 | Temp 96.6°F | Resp 18 | Wt 154.4 lb

## 2015-11-05 DIAGNOSIS — Z9221 Personal history of antineoplastic chemotherapy: Secondary | ICD-10-CM | POA: Diagnosis not present

## 2015-11-05 DIAGNOSIS — I10 Essential (primary) hypertension: Secondary | ICD-10-CM | POA: Insufficient documentation

## 2015-11-05 DIAGNOSIS — R739 Hyperglycemia, unspecified: Secondary | ICD-10-CM | POA: Insufficient documentation

## 2015-11-05 DIAGNOSIS — R05 Cough: Secondary | ICD-10-CM

## 2015-11-05 DIAGNOSIS — R5383 Other fatigue: Secondary | ICD-10-CM | POA: Insufficient documentation

## 2015-11-05 DIAGNOSIS — Z87891 Personal history of nicotine dependence: Secondary | ICD-10-CM

## 2015-11-05 DIAGNOSIS — E079 Disorder of thyroid, unspecified: Secondary | ICD-10-CM

## 2015-11-05 DIAGNOSIS — R531 Weakness: Secondary | ICD-10-CM | POA: Insufficient documentation

## 2015-11-05 DIAGNOSIS — C3490 Malignant neoplasm of unspecified part of unspecified bronchus or lung: Secondary | ICD-10-CM

## 2015-11-05 DIAGNOSIS — Z923 Personal history of irradiation: Secondary | ICD-10-CM | POA: Diagnosis not present

## 2015-11-05 DIAGNOSIS — Z79899 Other long term (current) drug therapy: Secondary | ICD-10-CM | POA: Diagnosis not present

## 2015-11-05 DIAGNOSIS — Z85118 Personal history of other malignant neoplasm of bronchus and lung: Secondary | ICD-10-CM

## 2015-11-05 DIAGNOSIS — E7800 Pure hypercholesterolemia, unspecified: Secondary | ICD-10-CM | POA: Insufficient documentation

## 2015-11-05 LAB — CBC WITH DIFFERENTIAL/PLATELET
Basophils Absolute: 0 10*3/uL (ref 0–0.1)
Basophils Relative: 1 %
EOS ABS: 0.2 10*3/uL (ref 0–0.7)
Eosinophils Relative: 3 %
HEMATOCRIT: 36.8 % (ref 35.0–47.0)
HEMOGLOBIN: 12.3 g/dL (ref 12.0–16.0)
LYMPHS ABS: 2 10*3/uL (ref 1.0–3.6)
Lymphocytes Relative: 27 %
MCH: 28 pg (ref 26.0–34.0)
MCHC: 33.4 g/dL (ref 32.0–36.0)
MCV: 83.7 fL (ref 80.0–100.0)
MONOS PCT: 8 %
Monocytes Absolute: 0.6 10*3/uL (ref 0.2–0.9)
NEUTROS ABS: 4.8 10*3/uL (ref 1.4–6.5)
NEUTROS PCT: 61 %
Platelets: 277 10*3/uL (ref 150–440)
RBC: 4.4 MIL/uL (ref 3.80–5.20)
RDW: 14.2 % (ref 11.5–14.5)
WBC: 7.6 10*3/uL (ref 3.6–11.0)

## 2015-11-05 LAB — COMPREHENSIVE METABOLIC PANEL
ALBUMIN: 4.2 g/dL (ref 3.5–5.0)
ALK PHOS: 84 U/L (ref 38–126)
ALT: 14 U/L (ref 14–54)
AST: 25 U/L (ref 15–41)
Anion gap: 8 (ref 5–15)
BILIRUBIN TOTAL: 0.6 mg/dL (ref 0.3–1.2)
BUN: 18 mg/dL (ref 6–20)
CALCIUM: 9.8 mg/dL (ref 8.9–10.3)
CO2: 26 mmol/L (ref 22–32)
CREATININE: 1.02 mg/dL — AB (ref 0.44–1.00)
Chloride: 101 mmol/L (ref 101–111)
GFR calc non Af Amer: 50 mL/min — ABNORMAL LOW (ref >60)
GFR, EST AFRICAN AMERICAN: 58 mL/min — AB (ref >60)
GLUCOSE: 217 mg/dL — AB (ref 65–99)
Potassium: 4.3 mmol/L (ref 3.5–5.1)
SODIUM: 135 mmol/L (ref 135–145)
TOTAL PROTEIN: 7.9 g/dL (ref 6.5–8.1)

## 2015-11-05 LAB — MAGNESIUM: Magnesium: 1.8 mg/dL (ref 1.7–2.4)

## 2015-11-05 NOTE — Progress Notes (Signed)
[place] @ [facility] [phone]  Fax:([phone]) [place]: 08/04/1930  MR#: [id number]  ZJQ#:[id number]  Patient Care Team: [name], MD as PCP - General (Internal Medicine)  CHIEF COMPLAINT:  Chief Complaint  Patient presents with  . Lung Cancer   Oncology History   patient is an 82 year old female with probable stage IV (T4 N2 M1) adenocarcinoma of the right lung with intrathoracic lower lobe metastasis as well as a T4 lung lesion with direct invasion of the mediastinum and pulmonary artery invasion.stage IV tissue  is insufficient for EGFR and  ALK MUTATION [place] Blood days is not positivefor any EGFR oor ALK mutation  2.  Starting radiation and chemotherapy from April 14, 2014 Patient was started on carboplatinum andTaxol Herve were developed an allergic reaction to Taxol so would be changed to Abraxane 3.patient has finished 6 cycles of weekly chemotherapy with carboplatinum  and radiation therapy(May 28, 2014) 4.started on  NIVOLULAMAB because of persistent disease July 02, 2014. 5.  NIVOLULAMAB was discontinued because of persistent diarrhea in July of 2016.  August of 2016 CT scan was stable so no further chemotherapy        Oncology Flowsheet 10/23/2014 11/06/2014 11/20/2014 12/04/2014 12/09/2014  dexamethasone (DECADRON) IV - - - - 8 mg  methylPREDNISolone sodium succinate 125 mg/2 mL (SOLU-MEDROL) IV - - - 60 mg -  nivolumab (OPDIVO) IV 200 mg 200 mg 200 mg - -    INTERVAL HISTORY: 82 year old lady who had diarrhea secondary to [facility] so was discontinued.  After prednisone therapy patient appetite improved diarrhea improved.  Patient had a repeat PET scan.  At present time no abdominal pain appetite is improving gaining energy level.  Ambulatory.  Here for further follow-up and treatment consideration.  No chills.  No fever. February 27, 2015.  Patient is here for ongoing evaluation and treatment consideration.  Patient  is off all chemotherapy Patient is off prednisone.  Appetite has been improving.  No nausea.  No vomiting.  No diarrhea.  Patient has occasional cough in the morning.  But no hemoptysis or chest pain.  Here for further follow-up regarding carcinoma of lung stage IV disease patient presently off all treatment.   No shortness of breath REVIEW OF SYSTEMS:    general status: Patient is feeling weak and tired.  Energy level is somewhat improving No change in a performance status.  No chills.  No fever. HEENT: .  No evidence of stomatitis Lungs: No cough or shortness of breath Cardiac: No chest pain or paroxysmal nocturnal dyspnea GI: No nausea no vomiting no diarrhea no abdominal pain Diarrhea has improved. Skin: No rash Lower extremity no swelling Neurological system: No tingling.  No numbness.  No other focal signs Musculoskeletal system no bony pains  As per HPI. Otherwise, a complete review of systems is negatve.  PAST MEDICAL HISTORY: Past Medical History  Diagnosis Date  . Hypertension   . Hypercholesterolemia   . Osteoporosis   . Hypercalcemia     familial hypocalciuric hypercalcemia  . Chicken pox   . Thyroid disease   . Lung cancer ([place])     PAST SURGICAL HISTORY: Past Surgical History  Procedure Laterality Date  . Transvaginal hysterectomy  04/18/05    with anterior colporrhaphy  . Cholecystectomy    . Abdominal hysterectomy      partial    FAMILY HISTORY Family History  Problem Relation Age of Onset  . Stroke Mother   . Hypertension  Mother   . Prostate cancer Father   . Cancer Father     prostate  . Heart disease Brother     s/p CABG  . Colon cancer Neg Hx     ADVANCED DIRECTIVES:   Patient does have advance healthcare directive, Patient   does not desire to make any changes HEALTH MAINTENANCE: Social History  Substance Use Topics  . Smoking status: Former Smoker    Quit date: 06/13/1997  . Smokeless tobacco: Never Used  . Alcohol Use: No       Allergies  Allergen Reactions  . Paclitaxel Other (See Comments)    Chest tightness    Current Outpatient Prescriptions  Medication Sig Dispense Refill  . cholecalciferol (VITAMIN D) 400 UNITS TABS Take 400 Units by mouth daily.    . fluticasone (FLONASE) 50 MCG/ACT nasal spray Place 2 sprays into both nostrils daily. 16 g 6  . nystatin cream (MYCOSTATIN) Apply 1 application topically 2 (two) times daily. 30 g 0  . olmesartan-hydrochlorothiazide (BENICAR HCT) 20-12.5 MG tablet Take 1 tablet by mouth daily. 90 tablet 3  . pantoprazole (PROTONIX) 40 MG tablet TAKE 1 TABLET BY MOUTH ONCE DAILY. 30 tablet 5  . potassium chloride (K-DUR) 10 MEQ tablet Take 1 tablet (10 mEq total) by mouth daily. 15 tablet 0  . predniSONE (DELTASONE) 10 MG tablet Take 1 tablet every Tuesday and Thursday for 2 weeks then stop     No current facility-administered medications for this visit.   Facility-Administered Medications Ordered in Other Visits  Medication Dose Route Frequency Provider Last Rate Last Dose  . sodium chloride 0.9 % 1,000 mL with potassium chloride 20 mEq, magnesium sulfate 2 g infusion   Intravenous Continuous Janak Choksi, MD   Stopped at 12/04/14 1300  . sodium chloride 0.9 % injection 10 mL  10 mL Intracatheter PRN Janak Choksi, MD        OBJECTIVE:  Filed Vitals:   11/05/15 1106  BP: 138/71  Pulse: 87  Temp: 96.6 F (35.9 C)  Resp: 18     Body mass index is 27.12 kg/(m^2).    ECOG FS:1 - Symptomatic but completely ambulatory  PHYSICAL EXAM: Goal status: Performance status is good.  Patient has not lost significant weight HEENT: No evidence of stomatitis. Sclera and conjunctivae :: No jaundice.   pale looking. Lungs: Air  entry equal on both sides.  No rhonchi.  No rales.  Cardiac: Heart sounds are normal.  No pericardial rub.  No murmur. Lymphatic system: Cervical, axillary, inguinal, lymph nodes not palpable GI: Abdomen is soft.  No ascites.  Liver spleen not  palpable.  No tenderness.  Bowel sounds are within normal limit Lower extremity: No edema Neurological system: Higher functions, cranial nerves intact no evidence of peripheral neuropathy. Skin: No rash.  No ecchymosis..   LAB RESULTS:  CBC Latest Ref Rng 11/05/2015 08/06/2015  WBC 3.6 - 11.0 K/uL 7.6 7.2  Hemoglobin 12.0 - 16.0 g/dL 12.3 11.9(L)  Hematocrit 35.0 - 47.0 % 36.8 35.2  Platelets 150 - 440 K/uL 277 238    Appointment on 11/05/2015  Component Date Value Ref Range Status  . WBC 11/05/2015 7.6  3.6 - 11.0 K/uL Final  . RBC 11/05/2015 4.40  3.80 - 5.20 MIL/uL Final  . Hemoglobin 11/05/2015 12.3  12.0 - 16.0 g/dL Final  . HCT 11/05/2015 36.8  35.0 - 47.0 % Final  . MCV 11/05/2015 83.7  80.0 - 100.0 fL Final  . MCH 11/05/2015 28.0    26.0 - 34.0 pg Final  . MCHC 11/05/2015 33.4  32.0 - 36.0 g/dL Final  . RDW 11/05/2015 14.2  11.5 - 14.5 % Final  . Platelets 11/05/2015 277  150 - 440 K/uL Final  . Neutrophils Relative % 11/05/2015 61   Final  . Neutro Abs 11/05/2015 4.8  1.4 - 6.5 K/uL Final  . Lymphocytes Relative 11/05/2015 27   Final  . Lymphs Abs 11/05/2015 2.0  1.0 - 3.6 K/uL Final  . Monocytes Relative 11/05/2015 8   Final  . Monocytes Absolute 11/05/2015 0.6  0.2 - 0.9 K/uL Final  . Eosinophils Relative 11/05/2015 3   Final  . Eosinophils Absolute 11/05/2015 0.2  0 - 0.7 K/uL Final  . Basophils Relative 11/05/2015 1   Final  . Basophils Absolute 11/05/2015 0.0  0 - 0.1 K/uL Final  . Sodium 11/05/2015 135  135 - 145 mmol/L Final  . Potassium 11/05/2015 4.3  3.5 - 5.1 mmol/L Final  . Chloride 11/05/2015 101  101 - 111 mmol/L Final  . CO2 11/05/2015 26  22 - 32 mmol/L Final  . Glucose, Bld 11/05/2015 217* 65 - 99 mg/dL Final  . BUN 11/05/2015 18  6 - 20 mg/dL Final  . Creatinine, Ser 11/05/2015 1.02* 0.44 - 1.00 mg/dL Final  . Calcium 11/05/2015 9.8  8.9 - 10.3 mg/dL Final  . Total Protein 11/05/2015 7.9  6.5 - 8.1 g/dL Final  . Albumin 11/05/2015 4.2  3.5 - 5.0  g/dL Final  . AST 11/05/2015 25  15 - 41 U/L Final  . ALT 11/05/2015 14  14 - 54 U/L Final  . Alkaline Phosphatase 11/05/2015 84  38 - 126 U/L Final  . Total Bilirubin 11/05/2015 0.6  0.3 - 1.2 mg/dL Final  . GFR calc non Af Amer 11/05/2015 50* >60 mL/min Final  . GFR calc Af Amer 11/05/2015 58* >60 mL/min Final   Comment: (NOTE) The eGFR has been calculated using the CKD EPI equation. This calculation has not been validated in all clinical situations. eGFR's persistently <60 mL/min signify possible Chronic Kidney Disease.   . Anion gap 11/05/2015 8  5 - 15 Final  . Magnesium 11/05/2015 1.8  1.7 - 2.4 mg/dL Final        ASSESSMENT: Carcinoma of lung stage IV disease Patient is off all prednisone therapy.  Nonfasting blood sugar continues to be slightly elevated. A repeat PET scan in 3 months has been recommended for restaging On clinical ground there is no evidence of recurrent or progressive disease I informed patient about my retirement in Dr. Jacinto Reap Will evaluate patient after PET scan is done. Regarding hyperglycemia patient was asked to contact primary care physician  Patient also had abdominal aneurysm which is being followed by ultrasound by vascular surgeon    Patient expressed understanding and was in agreement with this plan. She also understands that She can call clinic at any time with any questions, concerns, or complaints.    No matching staging information was found for the patient.  Forest Gleason, MD   11/05/2015 11:47 AM

## 2015-11-06 ENCOUNTER — Telehealth: Payer: Self-pay | Admitting: Internal Medicine

## 2015-11-06 DIAGNOSIS — R739 Hyperglycemia, unspecified: Secondary | ICD-10-CM

## 2015-11-06 DIAGNOSIS — E7800 Pure hypercholesterolemia, unspecified: Secondary | ICD-10-CM

## 2015-11-06 NOTE — Telephone Encounter (Signed)
Please call and notify pt that I received notification from Dr Oliva Bustard that her sugar was elevated more.  She needs a fasting lab appt scheduled - to f/u on her sugars.  Please notify pt and schedule fasting lab appt.   Thanks   Dr Nicki Reaper

## 2015-11-06 NOTE — Telephone Encounter (Signed)
Spoke with husband (pt will not be home until after 5:00). I asked him to have her call the office to setup a fasting lab appt.

## 2015-11-20 NOTE — Telephone Encounter (Signed)
Pt never returned call. Left a detailed message on voicemail to call back and schedule a fasting lab.

## 2015-11-26 ENCOUNTER — Other Ambulatory Visit (INDEPENDENT_AMBULATORY_CARE_PROVIDER_SITE_OTHER): Payer: Medicare Other

## 2015-11-26 DIAGNOSIS — E7800 Pure hypercholesterolemia, unspecified: Secondary | ICD-10-CM

## 2015-11-26 DIAGNOSIS — R739 Hyperglycemia, unspecified: Secondary | ICD-10-CM

## 2015-11-26 LAB — BASIC METABOLIC PANEL
BUN: 20 mg/dL (ref 6–23)
CHLORIDE: 102 meq/L (ref 96–112)
CO2: 30 meq/L (ref 19–32)
Calcium: 10.2 mg/dL (ref 8.4–10.5)
Creatinine, Ser: 0.89 mg/dL (ref 0.40–1.20)
GFR: 77.92 mL/min (ref >60.00)
GLUCOSE: 120 mg/dL — AB (ref 70–99)
Potassium: 4.1 mEq/L (ref 3.5–5.1)
Sodium: 138 mEq/L (ref 135–145)

## 2015-11-26 LAB — LIPID PANEL
CHOLESTEROL: 172 mg/dL (ref 0–200)
HDL: 46.7 mg/dL (ref >39.00)
LDL Cholesterol: 104 mg/dL — ABNORMAL HIGH (ref 0–99)
NonHDL: 125.14
TRIGLYCERIDES: 104 mg/dL (ref 0.0–149.0)
Total CHOL/HDL Ratio: 4
VLDL: 20.8 mg/dL (ref 0.0–40.0)

## 2015-11-26 LAB — HEMOGLOBIN A1C: HEMOGLOBIN A1C: 6.3 % (ref 4.6–6.5)

## 2015-11-27 ENCOUNTER — Encounter: Payer: Self-pay | Admitting: *Deleted

## 2015-12-18 ENCOUNTER — Other Ambulatory Visit: Payer: Self-pay | Admitting: Internal Medicine

## 2016-01-05 ENCOUNTER — Ambulatory Visit: Payer: Medicare Other | Admitting: Internal Medicine

## 2016-01-06 ENCOUNTER — Ambulatory Visit: Payer: Medicare Other | Admitting: Internal Medicine

## 2016-01-07 ENCOUNTER — Encounter: Payer: Self-pay | Admitting: Internal Medicine

## 2016-01-07 ENCOUNTER — Ambulatory Visit (INDEPENDENT_AMBULATORY_CARE_PROVIDER_SITE_OTHER): Payer: Medicare Other | Admitting: Internal Medicine

## 2016-01-07 VITALS — BP 120/70 | HR 79 | Temp 97.6°F | Resp 17 | Wt 157.5 lb

## 2016-01-07 DIAGNOSIS — I714 Abdominal aortic aneurysm, without rupture, unspecified: Secondary | ICD-10-CM

## 2016-01-07 DIAGNOSIS — R739 Hyperglycemia, unspecified: Secondary | ICD-10-CM

## 2016-01-07 DIAGNOSIS — R635 Abnormal weight gain: Secondary | ICD-10-CM | POA: Diagnosis not present

## 2016-01-07 DIAGNOSIS — C3490 Malignant neoplasm of unspecified part of unspecified bronchus or lung: Secondary | ICD-10-CM

## 2016-01-07 DIAGNOSIS — E7800 Pure hypercholesterolemia, unspecified: Secondary | ICD-10-CM | POA: Diagnosis not present

## 2016-01-07 DIAGNOSIS — I10 Essential (primary) hypertension: Secondary | ICD-10-CM

## 2016-01-07 DIAGNOSIS — K219 Gastro-esophageal reflux disease without esophagitis: Secondary | ICD-10-CM

## 2016-01-07 LAB — HEPATIC FUNCTION PANEL
ALT: 13 U/L (ref 0–35)
AST: 17 U/L (ref 0–37)
Albumin: 4.4 g/dL (ref 3.5–5.2)
Alkaline Phosphatase: 81 U/L (ref 39–117)
BILIRUBIN TOTAL: 0.4 mg/dL (ref 0.2–1.2)
Bilirubin, Direct: 0.1 mg/dL (ref 0.0–0.3)
Total Protein: 7.8 g/dL (ref 6.0–8.3)

## 2016-01-07 LAB — TSH: TSH: 0.79 u[IU]/mL (ref 0.35–4.50)

## 2016-01-07 NOTE — Progress Notes (Signed)
Pre-visit discussion using our clinic review tool. No additional management support is needed unless otherwise documented below in the visit note.  

## 2016-01-07 NOTE — Progress Notes (Signed)
Patient ID: Heather Cooper, female   DOB: [birth year], 82 y.o.   MRN: [id number]   Subjective:    Patient ID: Heather Cooper, female    DOB: July 15, 1930, 82 y.o.   MRN: [id number]  HPI  Patient here as a work in with concerns regarding hand stiffness.  She reports noticing some increased stiffness in her hands.  Worse in the am.  Better after she starts using her hands.  No chest pain.  Breathing doing well.  No increased cough.  Eating well.  She is questioning her weight gain.  Has gained 20 pounds over the last year.  Has been on prednisone.  Off now.  No bowel change.  No lower extremity swelling.  Overall feels good.     Past Medical History:  Diagnosis Date  . Chicken pox   . Hypercalcemia    familial hypocalciuric hypercalcemia  . Hypercholesterolemia   . Hypertension   . Lung cancer ([place])   . Osteoporosis   . Thyroid disease    Past Surgical History:  Procedure Laterality Date  . ABDOMINAL HYSTERECTOMY     partial  . CHOLECYSTECTOMY    . transvaginal hysterectomy  04/18/05   with anterior colporrhaphy   Family History  Problem Relation Age of Onset  . Stroke Mother   . Hypertension Mother   . Prostate cancer Father   . Cancer Father     prostate  . Heart disease Brother     s/p CABG  . Colon cancer Neg Hx    Social History   Social History  . Marital status: Married    Spouse name: N/A  . Number of children: 3  . Years of education: N/A   Social History Main Topics  . Smoking status: Former Smoker    Quit date: 06/13/1997  . Smokeless tobacco: Never Used  . Alcohol use No  . Drug use: No  . Sexual activity: Not Asked   Other Topics Concern  . None   Social History Narrative  . None    Outpatient Encounter Prescriptions as of 01/07/2016  Medication Sig  . cholecalciferol (VITAMIN D) 400 UNITS TABS Take 400 Units by mouth daily.  . fluticasone (FLONASE) 50 MCG/ACT nasal spray Place 2 sprays into both nostrils daily.  [name] nystatin cream (MYCOSTATIN)  Apply 1 application topically 2 (two) times daily.  [name] olmesartan-hydrochlorothiazide (BENICAR HCT) 20-12.5 MG tablet Take 1 tablet by mouth daily.  . pantoprazole (PROTONIX) 40 MG tablet TAKE 1 TABLET BY MOUTH ONCE DAILY.  [name] potassium chloride (K-DUR) 10 MEQ tablet Take 1 tablet (10 mEq total) by mouth daily.  . predniSONE (DELTASONE) 10 MG tablet Take 1 tablet every Tuesday and Thursday for 2 weeks then stop   Facility-Administered Encounter Medications as of 01/07/2016  Medication  . sodium chloride 0.9 % 1,000 mL with potassium chloride 20 mEq, magnesium sulfate 2 g infusion  . sodium chloride 0.9 % injection 10 mL    Review of Systems  Constitutional: Negative for appetite change.       Had question about her weight gain.   HENT: Negative for congestion and sinus pressure.   Respiratory: Negative for cough, chest tightness and shortness of breath.   Cardiovascular: Negative for chest pain, palpitations and leg swelling.  Gastrointestinal: Negative for abdominal pain, diarrhea, nausea and vomiting.  Genitourinary: Negative for difficulty urinating and dysuria.  Musculoskeletal: Negative for back pain and joint swelling.  Skin: Negative for color change and rash.  Neurological: Negative for  dizziness, light-headedness and headaches.  Psychiatric/Behavioral: Negative for agitation and dysphoric mood.       Objective:     Blood pressure rechecked by me:  124/62  Physical Exam  Constitutional: She appears well-developed and well-nourished. She appears distressed.  HENT:  Nose: Nose normal.  Mouth/Throat: Oropharynx is clear and moist.  Neck: Neck supple. No thyromegaly present.  Cardiovascular: Normal rate and regular rhythm.   Pulmonary/Chest: Breath sounds normal. No respiratory distress. She has no wheezes.  Abdominal: Soft. Bowel sounds are normal. There is no tenderness.  Musculoskeletal: She exhibits no edema or tenderness.  Lymphadenopathy:    She has no cervical  adenopathy.  Skin: No rash noted. No erythema.  Psychiatric: She has a normal mood and affect. Her behavior is normal.    BP 120/70 (BP Location: Left Arm, Patient Position: Sitting, Cuff Size: Normal)   Pulse 79   Temp 97.6 F (36.4 C) (Oral)   Resp 17   Wt 157 lb 8 oz (71.4 kg)   SpO2 99%   BMI 27.68 kg/m  Wt Readings from Last 3 Encounters:  01/07/16 157 lb 8 oz (71.4 kg)  11/05/15 154 lb 6 oz (70 kg)  08/06/15 152 lb 4 oz (69.1 kg)     Lab Results  Component Value Date   WBC 7.6 11/05/2015   HGB 12.3 11/05/2015   HCT 36.8 11/05/2015   PLT 277 11/05/2015   GLUCOSE 120 (H) 11/26/2015   CHOL 172 11/26/2015   TRIG 104.0 11/26/2015   HDL 46.70 11/26/2015   LDLCALC 104 (H) 11/26/2015   ALT 13 01/07/2016   AST 17 01/07/2016   NA 138 11/26/2015   K 4.1 11/26/2015   CL 102 11/26/2015   CREATININE 0.89 11/26/2015   BUN 20 11/26/2015   CO2 30 11/26/2015   TSH 0.79 01/07/2016   INR 1.0 03/18/2014   INR 1.0 03/18/2014   HGBA1C 6.3 11/26/2015    Nm Pet Image Restag (ps) Skull Base To Thigh  Result Date: 08/04/2015 CLINICAL DATA:  Subsequent treatment strategy for lung cancer. Restaging examination. EXAM: NUCLEAR MEDICINE PET SKULL BASE TO THIGH TECHNIQUE: 12.65 mCi F-18 FDG was injected intravenously. Full-ring PET imaging was performed from the skull base to thigh after the radiotracer. CT data was obtained and used for attenuation correction and anatomic localization. FASTING BLOOD GLUCOSE:  Value: 101 mg/dl COMPARISON:  PET-CT 01/15/2015. Multiple more remote prior examinations. FINDINGS: NECK No hypermetabolic lymph nodes in the neck. CHEST Previously treated paramediastinal right upper lobe mass is again noted, with an area of postradiation fibrosis indicative of a normal post therapeutic response. In the central aspect of this there continues to be a well-circumscribed 1.6 x 2.1 cm nodule (image 73 of series 3) which is grossly stable in size and appearance compared to the  prior study, and demonstrates only low-level internal metabolic activity (SUVmax = 2.5), which is similar to internal metabolism on the prior study (SUVmax = 2.2 on 01/15/2015)), favored to be an area of chronic radiation fibrosis. Other previously noted pulmonary nodules in the right lower lobe and lateral aspect of the left lower lobe now very ill-defined and continue to decreased in size compared to prior examinations, neither of which demonstrate hypermetabolism on today's study (SUVmax = 1.1 and 1.2 respectively). No new suspicious appearing pulmonary nodules or masses. No acute consolidative airspace disease. No pleural effusions. No hypermetabolic mediastinal or hilar nodes. Heart size is normal. There is no significant pericardial fluid, thickening or pericardial calcification. There  is atherosclerosis of the thoracic aorta, the great vessels of the mediastinum and the coronary arteries, including calcified atherosclerotic plaque in the left main, left anterior descending, left circumflex and right coronary arteries. Esophagus is unremarkable in appearance. No axillary lymphadenopathy. ABDOMEN/PELVIS No abnormal hypermetabolic activity within the liver, pancreas, adrenal glands, or spleen. No hypermetabolic lymph nodes in the abdomen or pelvis. Status post cholecystectomy. Small calcified granuloma in segment 3 of the liver incidentally noted. Extensive atherosclerosis throughout the abdominal and pelvic vasculature, including fusiform aneurysmal dilatation of the infrarenal abdominal aorta which measures up to 3.9 x 3.3 cm (image 163 of series 3), unchanged compared to the prior examination 01/15/2015. Numerous colonic diverticulae are noted, without surrounding inflammatory changes to suggest an acute diverticulitis at this time. Normal appendix. Status post hysterectomy. Ovaries are not confidently identified may be surgically absent or atrophic. SKELETON No focal hypermetabolic activity to suggest  skeletal metastasis. IMPRESSION: 1. Today's study demonstrates further positive response to therapy. Specifically, previously noted mediastinal lymph nodes have resolved. Previously noted pulmonary nodules in the right lower lobe and left lower lobe have also further decreased in size and demonstrate no hypermetabolism. 2. Dominant treated nodule in the medial aspect of the right upper lobe is stable in size and continues demonstrate minimal internal metabolic activity, favored to represent an area of chronic postradiation fibrosis. Continued attention on future examinations is recommended to ensure continued stability. 3. No signs of metastatic disease in the abdomen or pelvis. 4. Atherosclerosis, including left main and 3 vessel coronary artery disease. 5. In addition, there is a 3.9 x 3.3 cm fusiform infrarenal abdominal aortic aneurysm which is unchanged in size compared to the prior examination. Continued attention on followup studies is recommended. 6. Colonic diverticulosis without evidence of acute diverticulitis at this time. Electronically Signed   By: Vinnie Langton M.D.   On: 08/04/2015 12:12       Assessment & Plan:   Problem List Items Addressed This Visit    Abdominal aortic aneurysm ([place])    Found on PET.  Followed by vascular surgery.  See PET.  Unchanged - per report.       Carcinoma of lung ([place])    Followed at the cancer center.   PET as outlined.  Scheduled to have another PET.  Has f/u planned.  Doing well. Feels good.       GERD (gastroesophageal reflux disease)    Symptoms controlled on protonix.       Hypercalcemia    Followed by Dr Forde Dandy.       Hypercholesterolemia    Low cholesterol diet and exercise.  Follow lipid panel.        Hyperglycemia    Low carb diet and exercise.  Follow.       Hypertension    Blood pressure under good control.  Continue same medication regimen.  Follow pressures.  Follow metabolic panel.         Other Visit Diagnoses     Weight gain    -  Primary   Relevant Orders   TSH (Completed)   Hepatic function panel (Completed)       [name], MD

## 2016-01-08 ENCOUNTER — Encounter: Payer: Self-pay | Admitting: *Deleted

## 2016-01-09 ENCOUNTER — Encounter: Payer: Self-pay | Admitting: Internal Medicine

## 2016-01-09 NOTE — Assessment & Plan Note (Signed)
Low carb diet and exercise.  Follow.  

## 2016-01-09 NOTE — Assessment & Plan Note (Signed)
Found on PET.  Followed by vascular surgery.  See PET.  Unchanged - per report.

## 2016-01-09 NOTE — Assessment & Plan Note (Signed)
Low cholesterol diet and exercise.  Follow lipid panel.   

## 2016-01-09 NOTE — Assessment & Plan Note (Signed)
Blood pressure under good control.  Continue same medication regimen.  Follow pressures.  Follow metabolic panel.   

## 2016-01-09 NOTE — Assessment & Plan Note (Signed)
Symptoms controlled on protonix.

## 2016-01-09 NOTE — Assessment & Plan Note (Signed)
Followed at the cancer center.   PET as outlined.  Scheduled to have another PET.  Has f/u planned.  Doing well. Feels good.

## 2016-01-09 NOTE — Assessment & Plan Note (Addendum)
Followed by Dr South.   

## 2016-02-05 ENCOUNTER — Other Ambulatory Visit: Payer: Self-pay

## 2016-02-05 ENCOUNTER — Encounter
Admission: RE | Admit: 2016-02-05 | Discharge: 2016-02-05 | Disposition: A | Discharge status: Home / Self Care | Payer: Medicare Other | Source: Ambulatory Visit | Attending: Oncology | Admitting: Oncology

## 2016-02-05 DIAGNOSIS — C3490 Malignant neoplasm of unspecified part of unspecified bronchus or lung: Secondary | ICD-10-CM | POA: Diagnosis present

## 2016-02-05 LAB — GLUCOSE, CAPILLARY: GLUCOSE-CAPILLARY: 109 mg/dL — AB (ref 65–99)

## 2016-02-05 MED ORDER — FLUDEOXYGLUCOSE F - 18 (FDG) INJECTION
12.1600 | Freq: Once | INTRAVENOUS | Status: AC | PRN
  Administered 2016-02-05: 12.16 via INTRAVENOUS

## 2016-02-08 ENCOUNTER — Inpatient Hospital Stay: Payer: Medicare Other

## 2016-02-08 ENCOUNTER — Inpatient Hospital Stay: Payer: Medicare Other | Attending: Internal Medicine | Admitting: Internal Medicine

## 2016-02-08 ENCOUNTER — Encounter (INDEPENDENT_AMBULATORY_CARE_PROVIDER_SITE_OTHER): Payer: Self-pay

## 2016-02-08 ENCOUNTER — Encounter: Payer: Self-pay | Admitting: Internal Medicine

## 2016-02-08 VITALS — BP 111/65 | HR 79 | Temp 97.6°F | Resp 18 | Wt 159.4 lb

## 2016-02-08 DIAGNOSIS — Z8042 Family history of malignant neoplasm of prostate: Secondary | ICD-10-CM | POA: Insufficient documentation

## 2016-02-08 DIAGNOSIS — Y842 Radiological procedure and radiotherapy as the cause of abnormal reaction of the patient, or of later complication, without mention of misadventure at the time of the procedure: Secondary | ICD-10-CM | POA: Diagnosis not present

## 2016-02-08 DIAGNOSIS — I10 Essential (primary) hypertension: Secondary | ICD-10-CM | POA: Diagnosis not present

## 2016-02-08 DIAGNOSIS — I714 Abdominal aortic aneurysm, without rupture: Secondary | ICD-10-CM | POA: Insufficient documentation

## 2016-02-08 DIAGNOSIS — Z79899 Other long term (current) drug therapy: Secondary | ICD-10-CM | POA: Diagnosis not present

## 2016-02-08 DIAGNOSIS — Z888 Allergy status to other drugs, medicaments and biological substances status: Secondary | ICD-10-CM | POA: Diagnosis not present

## 2016-02-08 DIAGNOSIS — Z9221 Personal history of antineoplastic chemotherapy: Secondary | ICD-10-CM | POA: Diagnosis not present

## 2016-02-08 DIAGNOSIS — E079 Disorder of thyroid, unspecified: Secondary | ICD-10-CM | POA: Insufficient documentation

## 2016-02-08 DIAGNOSIS — C3490 Malignant neoplasm of unspecified part of unspecified bronchus or lung: Secondary | ICD-10-CM

## 2016-02-08 DIAGNOSIS — Z87891 Personal history of nicotine dependence: Secondary | ICD-10-CM | POA: Diagnosis not present

## 2016-02-08 DIAGNOSIS — M810 Age-related osteoporosis without current pathological fracture: Secondary | ICD-10-CM | POA: Insufficient documentation

## 2016-02-08 DIAGNOSIS — Z923 Personal history of irradiation: Secondary | ICD-10-CM

## 2016-02-08 DIAGNOSIS — C7801 Secondary malignant neoplasm of right lung: Secondary | ICD-10-CM | POA: Insufficient documentation

## 2016-02-08 DIAGNOSIS — Z85118 Personal history of other malignant neoplasm of bronchus and lung: Secondary | ICD-10-CM | POA: Insufficient documentation

## 2016-02-08 DIAGNOSIS — C3411 Malignant neoplasm of upper lobe, right bronchus or lung: Secondary | ICD-10-CM | POA: Insufficient documentation

## 2016-02-08 LAB — CBC WITH DIFFERENTIAL/PLATELET
BASOS ABS: 0.1 10*3/uL (ref 0–0.1)
Basophils Relative: 1 %
EOS ABS: 0.2 10*3/uL (ref 0–0.7)
EOS PCT: 3 %
HCT: 35 % (ref 35.0–47.0)
Hemoglobin: 11.9 g/dL — ABNORMAL LOW (ref 12.0–16.0)
Lymphocytes Relative: 24 %
Lymphs Abs: 1.9 10*3/uL (ref 1.0–3.6)
MCH: 27.9 pg (ref 26.0–34.0)
MCHC: 34.1 g/dL (ref 32.0–36.0)
MCV: 81.7 fL (ref 80.0–100.0)
MONO ABS: 0.6 10*3/uL (ref 0.2–0.9)
Monocytes Relative: 7 %
Neutro Abs: 5.2 10*3/uL (ref 1.4–6.5)
Neutrophils Relative %: 65 %
PLATELETS: 281 10*3/uL (ref 150–440)
RBC: 4.28 MIL/uL (ref 3.80–5.20)
RDW: 14.7 % — AB (ref 11.5–14.5)
WBC: 8 10*3/uL (ref 3.6–11.0)

## 2016-02-08 LAB — COMPREHENSIVE METABOLIC PANEL
ALT: 13 U/L — AB (ref 14–54)
AST: 19 U/L (ref 15–41)
Albumin: 4.2 g/dL (ref 3.5–5.0)
Alkaline Phosphatase: 89 U/L (ref 38–126)
Anion gap: 7 (ref 5–15)
BUN: 20 mg/dL (ref 6–20)
CHLORIDE: 102 mmol/L (ref 101–111)
CO2: 26 mmol/L (ref 22–32)
CREATININE: 0.82 mg/dL (ref 0.44–1.00)
Calcium: 9.5 mg/dL (ref 8.9–10.3)
Glucose, Bld: 105 mg/dL — ABNORMAL HIGH (ref 65–99)
POTASSIUM: 3.9 mmol/L (ref 3.5–5.1)
SODIUM: 135 mmol/L (ref 135–145)
Total Bilirubin: 0.4 mg/dL (ref 0.3–1.2)
Total Protein: 7.9 g/dL (ref 6.5–8.1)

## 2016-02-08 NOTE — Assessment & Plan Note (Addendum)
Right upper lobe lung cancer adeno ca- Stage IV [contralateral left lower lobe lung nodule] status post chemoradiation-s/p Nivo currently on surveillance.   # Restaging PET scan-stable right upper lobe mass/hilar mass; less than centimeter suspicious mediastinal adenopathy. Clinically asymptomatic  # I would recommend surveillance for now. Repeat a PET scan again in 4 months  # AAA- stable- follow up with Dr.Schneir. Stable.   # # I reviewed the blood work- with the patient in detail; also reviewed the imaging independently [as summarized above]; and with the patient in detail.   # Follow-up with me with labs/ scan prior.   # 25 minutes face-to-face with the patient discussing the above plan of care; more than 50% of time spent on prognosis/ natural history; counseling and coordination.

## 2016-02-08 NOTE — Progress Notes (Signed)
[place] OFFICE PROGRESS NOTE  Patient Care Team: [name], MD as PCP - General (Internal Medicine)  No matching staging information was found for the patient.   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  Starting radiation and chemotherapy from April 14, 2014 Patient was started on carboplatinum andTaxol Herve were developed an allergic reaction to Taxol so would be changed to Abraxane 3.patient has finished 6 cycles of weekly chemotherapy with carboplatinum  and radiation therapy(May 28, 2014) 4.started on  NIVOLULAMAB because of persistent disease July 02, 2014. 5.  NIVOLULAMAB was discontinued because of persistent diarrhea in July of 2016.  August of 2016 CT scan was stable so no further chemotherapy  # AUG 25th PET- STABLE RUL MASS [radiation fibrosis; <1cm ? Mediastinal recurrence]; repeat PET in 4 m  # AAA/ 3.3x 3.9 Stable [Dr.Schnier]     Carcinoma of lung ([place])    Cancer of upper lobe of right lung ([place])   02/08/2016 Initial Diagnosis    Cancer of upper lobe of right lung [facility])     This is my first interaction with the patient as patient's primary oncologist has been Dr.Choksi. I reviewed the patient's prior charts/pertinent labs/imaging in detail; findings are summarized above.     INTERVAL HISTORY:  Heather Cooper 82 y.o.  female pleasant patient above history of Right upper lobe adenoca cell Stage IV lung cancer [contralateral lower lobe lung nodule]- currently on surveillance is here to review the results of her restaging PET scan.  Patient denies any nausea vomiting. Denies any unusual  shortness of breath or cough. Not losing any weight. No headaches. No tingling and numbness. Appetite is good.  REVIEW OF SYSTEMS:  A complete 10 point review of system is done which is negative except mentioned above/history of present illness.   PAST MEDICAL HISTORY :  Past Medical History:  Diagnosis Date  . Chicken pox   . Hypercalcemia    familial hypocalciuric hypercalcemia  . Hypercholesterolemia   . Hypertension   . Lung cancer ([place])   . Osteoporosis   . Thyroid disease     PAST SURGICAL HISTORY :   Past Surgical History:  Procedure Laterality Date  . ABDOMINAL HYSTERECTOMY     partial  . CHOLECYSTECTOMY    . transvaginal hysterectomy  04/18/05   with anterior colporrhaphy    FAMILY HISTORY :   Family History  Problem Relation Age of Onset  . Stroke Mother   . Hypertension Mother   . Prostate cancer Father   . Cancer Father     prostate  . Heart disease Brother     s/p CABG  . Colon cancer Neg Hx     SOCIAL HISTORY:   Social History  Substance Use Topics  . Smoking status: Former Smoker    Quit date: 06/13/1997  . Smokeless tobacco: Never Used  . Alcohol use No    ALLERGIES:  is allergic to paclitaxel.  MEDICATIONS:  Current Outpatient Prescriptions  Medication Sig Dispense Refill  . cholecalciferol (VITAMIN D) 400 UNITS TABS Take 400 Units by mouth daily.    . fluticasone (FLONASE) 50 MCG/ACT  nasal spray Place 2 sprays into both nostrils daily. 16 g 6  . nystatin cream (MYCOSTATIN) Apply 1 application topically 2 (two) times daily. 30 g 0  . olmesartan-hydrochlorothiazide (BENICAR HCT) 20-12.5 MG tablet Take 1 tablet by mouth daily. 90 tablet 3  . pantoprazole (PROTONIX) 40 MG tablet TAKE 1 TABLET BY MOUTH ONCE DAILY. 30 tablet 3   No current facility-administered medications for this visit.    Facility-Administered Medications Ordered in Other Visits  Medication Dose Route Frequency Provider Last Rate Last Dose  . sodium chloride 0.9 % 1,000 mL  with potassium chloride 20 mEq, magnesium sulfate 2 g infusion   Intravenous Continuous Forest Gleason, MD   Stopped at 12/04/14 1300  . sodium chloride 0.9 % injection 10 mL  10 mL Intracatheter PRN Forest Gleason, MD        PHYSICAL EXAMINATION: ECOG PERFORMANCE STATUS: 0 - Asymptomatic  BP 111/65 (BP Location: Left Arm, Patient Position: Sitting)   Pulse 79   Temp 97.6 F (36.4 C) (Tympanic)   Resp 18   Wt 159 lb 7 oz (72.3 kg)   BMI 28.02 kg/m   Filed Weights   02/08/16 1156  Weight: 159 lb 7 oz (72.3 kg)    GENERAL: Well-nourished well-developed; Alert, no distress and comfortable.   With son/ family.  EYES: no pallor or icterus OROPHARYNX: no thrush or ulceration; good dentition  NECK: supple, no masses felt LYMPH:  no palpable lymphadenopathy in the cervical, axillary or inguinal regions LUNGS: clear to auscultation and  No wheeze or crackles HEART/CVS: regular rate & rhythm and no murmurs; No lower extremity edema ABDOMEN:abdomen soft, non-tender and normal bowel sounds Musculoskeletal:no cyanosis of digits and no clubbing  PSYCH: alert & oriented x 3 with fluent speech NEURO: no focal motor/sensory deficits SKIN:  no rashes or significant lesions  LABORATORY DATA:  I have reviewed the data as listed    Component Value Date/Time   NA 135 02/08/2016 1110   NA 130 (L) 10/09/2014 0846   K 3.9 02/08/2016 1110   K 4.1 10/09/2014 0846   CL 102 02/08/2016 1110   CL 98 (L) 10/09/2014 0846   CO2 26 02/08/2016 1110   CO2 25 10/09/2014 0846   GLUCOSE 105 (H) 02/08/2016 1110   GLUCOSE 161 (H) 10/09/2014 0846   BUN 20 02/08/2016 1110   BUN 14 10/09/2014 0846   CREATININE 0.82 02/08/2016 1110   CREATININE 0.70 10/09/2014 0846   CALCIUM 9.5 02/08/2016 1110   CALCIUM 9.1 10/09/2014 0846   PROT 7.9 02/08/2016 1110   PROT 7.3 10/09/2014 0846   ALBUMIN 4.2 02/08/2016 1110   ALBUMIN 3.6 10/09/2014 0846   AST 19 02/08/2016 1110   AST 16 10/09/2014 0846   ALT 13 (L)  02/08/2016 1110   ALT 13 (L) 10/09/2014 0846   ALKPHOS 89 02/08/2016 1110   ALKPHOS 70 10/09/2014 0846   BILITOT 0.4 02/08/2016 1110   BILITOT 1.0 10/09/2014 0846   GFRNONAA >60 02/08/2016 1110   GFRNONAA >60 10/09/2014 0846   GFRAA >60 02/08/2016 1110   GFRAA >60 10/09/2014 0846    No results found for: SPEP, UPEP  Lab Results  Component Value Date   WBC 8.0 02/08/2016   NEUTROABS 5.2 02/08/2016   HGB 11.9 (L) 02/08/2016   HCT 35.0 02/08/2016   MCV 81.7 02/08/2016   PLT 281 02/08/2016      Chemistry      Component Value Date/Time   NA 135 02/08/2016 1110  NA 130 (L) 10/09/2014 0846   K 3.9 02/08/2016 1110   K 4.1 10/09/2014 0846   CL 102 02/08/2016 1110   CL 98 (L) 10/09/2014 0846   CO2 26 02/08/2016 1110   CO2 25 10/09/2014 0846   BUN 20 02/08/2016 1110   BUN 14 10/09/2014 0846   CREATININE 0.82 02/08/2016 1110   CREATININE 0.70 10/09/2014 0846   GLU 111 03/18/2014 1048      Component Value Date/Time   CALCIUM 9.5 02/08/2016 1110   CALCIUM 9.1 10/09/2014 0846   ALKPHOS 89 02/08/2016 1110   ALKPHOS 70 10/09/2014 0846   AST 19 02/08/2016 1110   AST 16 10/09/2014 0846   ALT 13 (L) 02/08/2016 1110   ALT 13 (L) 10/09/2014 0846   BILITOT 0.4 02/08/2016 1110   BILITOT 1.0 10/09/2014 0846      IMPRESSION: Radiation changes in the medial right upper lobe and perihilar region.  Small mediastinal lymph nodes measuring up to 8 mm short axis with new hypermetabolism, suspicious for nodal metastases.  3.3 x 3.7 cm infrarenal abdominal aortic aneurysm, grossly unchanged. Recommend followup by ultrasound in 2 years. This recommendation follows ACR consensus guidelines: White Paper of the ACR Incidental Findings Committee II on Vascular Findings. J Am Coll Radiol 2013; 10:789-794.   Electronically Signed   By: Julian Hy M.D.   On: 02/05/2016 13:50   RADIOGRAPHIC STUDIES: I have personally reviewed the radiological images as listed and agreed  with the findings in the report. No results found.   ASSESSMENT & PLAN:  Cancer of upper lobe of right lung ([place]) Right upper lobe lung cancer adeno ca- Stage IV [contralateral left lower lobe lung nodule] status post chemoradiation-s/p Nivo currently on surveillance.   # Restaging PET scan-stable right upper lobe mass/hilar mass; less than centimeter suspicious mediastinal adenopathy. Clinically asymptomatic  # I would recommend surveillance for now. Repeat a PET scan again in 4 months  # AAA- stable- follow up with Dr.Schneir. Stable.   # # I reviewed the blood work- with the patient in detail; also reviewed the imaging independently [as summarized above]; and with the patient in detail.   # Follow-up with me with labs/ scan prior.   # 25 minutes face-to-face with the patient discussing the above plan of care; more than 50% of time spent on prognosis/ natural history; counseling and coordination.   Orders Placed This Encounter  Procedures  . NM PET Image Restag (PS) Skull Base To Thigh    Standing Status:   Future    Standing Expiration Date:   04/09/2017    Order Specific Question:   Reason for Exam (SYMPTOM  OR DIAGNOSIS REQUIRED)    Answer:   lung cancer    Order Specific Question:   Preferred imaging location?    Answer:   [facility]  . Comprehensive metabolic panel    Standing Status:   Future    Standing Expiration Date:   02/07/2017  . CBC with Differential    Standing Status:   Future    Standing Expiration Date:   02/07/2017   All questions were answered. The patient knows to call the clinic with any problems, questions or concerns.      Cammie Sickle, MD 02/08/2016 1:19 PM

## 2016-03-21 ENCOUNTER — Ambulatory Visit (INDEPENDENT_AMBULATORY_CARE_PROVIDER_SITE_OTHER): Payer: Medicare Other

## 2016-03-21 DIAGNOSIS — Z23 Encounter for immunization: Secondary | ICD-10-CM

## 2016-03-21 NOTE — Progress Notes (Signed)
PAtient received flu shot

## 2016-03-25 ENCOUNTER — Other Ambulatory Visit: Payer: Self-pay | Admitting: Internal Medicine

## 2016-03-30 ENCOUNTER — Other Ambulatory Visit (INDEPENDENT_AMBULATORY_CARE_PROVIDER_SITE_OTHER): Payer: Self-pay | Admitting: Vascular Surgery

## 2016-03-30 DIAGNOSIS — I714 Abdominal aortic aneurysm, without rupture, unspecified: Secondary | ICD-10-CM

## 2016-03-31 ENCOUNTER — Ambulatory Visit (INDEPENDENT_AMBULATORY_CARE_PROVIDER_SITE_OTHER): Payer: Medicare Other

## 2016-03-31 ENCOUNTER — Ambulatory Visit (INDEPENDENT_AMBULATORY_CARE_PROVIDER_SITE_OTHER): Payer: Medicare Other | Admitting: Vascular Surgery

## 2016-03-31 ENCOUNTER — Encounter (INDEPENDENT_AMBULATORY_CARE_PROVIDER_SITE_OTHER): Payer: Self-pay | Admitting: Vascular Surgery

## 2016-03-31 VITALS — BP 134/79 | HR 77 | Resp 16 | Ht 63.0 in | Wt 158.0 lb

## 2016-03-31 DIAGNOSIS — I714 Abdominal aortic aneurysm, without rupture, unspecified: Secondary | ICD-10-CM

## 2016-03-31 DIAGNOSIS — I10 Essential (primary) hypertension: Secondary | ICD-10-CM | POA: Diagnosis not present

## 2016-03-31 DIAGNOSIS — C3490 Malignant neoplasm of unspecified part of unspecified bronchus or lung: Secondary | ICD-10-CM

## 2016-03-31 DIAGNOSIS — E7800 Pure hypercholesterolemia, unspecified: Secondary | ICD-10-CM | POA: Diagnosis not present

## 2016-03-31 NOTE — Progress Notes (Signed)
MRN : [id number]  Heather Cooper is a 82 y.o. (1930-07-14) female who presents with chief complaint of  Chief Complaint  Patient presents with  . Follow-up    Ultrasound  .  History of Present Illness:  The patient returns to the office for surveillance of an abdominal aortic aneurysm. Patient denies abdominal pain or back pain, no other abdominal complaints. There have been no interval changes in his overall health care since his last visit. Patient denies amaurosis fugax or TIA symptoms. There is no history of claudication or rest pain symptoms of the lower extremities. The patient denies angina or shortness of breath. Duplex US of the aorta and iliac arteries shows an AAA measured 3.57 cm with small bilateral iliac artery aneurysms.  Her recent PET scan was negative for recurrence of her right lung cancer.  Current Outpatient Prescriptions  Medication Sig Dispense Refill  . cholecalciferol (VITAMIN D) 400 UNITS TABS Take 400 Units by mouth daily.    . fluticasone (FLONASE) 50 MCG/ACT nasal spray Place 2 sprays into both nostrils daily. 16 g 6  . nystatin cream (MYCOSTATIN) Apply 1 application topically 2 (two) times daily. 30 g 0  . olmesartan-hydrochlorothiazide (BENICAR HCT) 20-12.5 MG tablet Take 1 tablet by mouth daily. 90 tablet 3  . pantoprazole (PROTONIX) 40 MG tablet TAKE 1 TABLET BY MOUTH ONCE DAILY. 30 tablet 11   No current facility-administered medications for this visit.    Facility-Administered Medications Ordered in Other Visits  Medication Dose Route Frequency Provider Last Rate Last Dose  . sodium chloride 0.9 % 1,000 mL with potassium chloride 20 mEq, magnesium sulfate 2 g infusion   Intravenous Continuous Forest Gleason, MD   Stopped at 12/04/14 1300  . sodium chloride 0.9 % injection 10 mL  10 mL Intracatheter PRN Forest Gleason, MD        Past Medical History:  Diagnosis Date  . Chicken pox   . Hypercalcemia    familial hypocalciuric hypercalcemia  .  Hypercholesterolemia   . Hypertension   . Lung cancer ([place])   . Osteoporosis   . Thyroid disease     Past Surgical History:  Procedure Laterality Date  . ABDOMINAL HYSTERECTOMY     partial  . CHOLECYSTECTOMY    . transvaginal hysterectomy  04/18/05   with anterior colporrhaphy    Social History Social History  Substance Use Topics  . Smoking status: Former Smoker    Quit date: 06/13/1997  . Smokeless tobacco: Never Used  . Alcohol use No    Allergies  Allergen Reactions  . Paclitaxel Other (See Comments)    Chest tightness     REVIEW OF SYSTEMS (Negative unless checked)  Constitutional: '[]'$ Weight loss  '[]'$ Fever  '[]'$ Chills Cardiac: '[]'$ Chest pain   '[]'$ Chest pressure   '[]'$ Palpitations   '[]'$ Shortness of breath at rest   '[]'$ Shortness of breath with exertion. Vascular:  '[]'$ Pain in legs with walking   '[]'$ Pain in legs at rest    '[]'$ Pain in feet at rest    '[]'$ History of DVT   '[]'$ Phlebitis   '[]'$ Swelling in legs   '[]'$ Varicose veins   '[]'$ Non-healing ulcers Pulmonary:   '[]'$ Uses home oxygen   '[]'$ Productive cough   '[]'$ Hemoptysis   '[]'$ Wheeze  '[]'$ COPD   '[]'$ Asthma Neurologic:  '[]'$ Dizziness  '[]'$ Blackouts   '[]'$ Seizures   '[]'$ History of stroke   '[]'$ History of TIA  '[]'$ Aphasia   '[]'$ Temporary blindness   '[]'$ Dysphagia   '[]'$ Weakness or numbness in arm   '[]'$ Weakness or numbness in leg  Musculoskeletal:  '[x]'$ Arthritis   '[]'$ Joint swelling   '[x]'$ Joint pain   '[]'$ Low back pain Hematologic:  '[]'$ Easy bruising  '[]'$ Easy bleeding   '[]'$ Hypercoagulable state   '[]'$ Anemic   Gastrointestinal:  '[]'$ Blood in stool   '[]'$ Vomiting blood  '[]'$ Gastroesophageal reflux/heartburn   '[]'$ Difficulty swallowing. Genitourinary:  '[]'$ Chronic kidney disease   '[]'$ Difficult urination  '[]'$ Frequent urination  '[]'$ Burning with urination   '[]'$ Blood in urine Skin:  '[]'$ Rashes   '[]'$ Ulcers   Psychological:  '[]'$ History of anxiety   '[]'$  History of major depression.    Physical Examination  Vitals:   03/31/16 1025  BP: 134/79  Pulse: 77  Resp: 16  Weight: 158 lb (71.7 kg)  Height: '5\' 3"'$  (1.6  m)   Body mass index is 27.99 kg/m. Gen:  WD/WN, NAD Head: [place]/AT, No temporalis wasting. Ear/Nose/Throat: Hearing grossly intact, nares w/o erythema or drainage, trachea midline Eyes: PERR, EOM appear normal. Sclera non-icteric Neck: Supple, no nuchal rigidity.  No bruit or JVD.  Pulmonary:  Good air movement, equal and clear to auscultation bilaterally.  Cardiac: RRR, normal S1, S2, no Murmurs, rubs or gallops. Vascular:   1+ edema bilaterally Vessel Right Left  Radial Palpable Palpable  Ulnar Palpable Palpable  Brachial Palpable Palpable  Carotid Palpable Palpable  Aorta Not palpable N/A  Femoral Palpable Palpable  Popliteal Palpable Palpable  PT Palpable Palpable  DP Palpable Palpable   Gastrointestinal: soft, non-rigid/non-distended. No guarding.  Musculoskeletal: M/S 5/5 throughout.  No deformity or atrophy. Neurologic: CN 2-12 intact. Pain and light touch intact in extremities.  Speech is fluent. Motor exam as listed above. Psychiatric: Judgment intact, Mood & affect appropriate for pt's clinical situation. Dermatologic: No rashes or ulcers noted.  No signs consistent with cellulitis. Lymphatic:  No cervical lymphadenopathy  CBC Lab Results  Component Value Date   WBC 8.0 02/08/2016   HGB 11.9 (L) 02/08/2016   HCT 35.0 02/08/2016   MCV 81.7 02/08/2016   PLT 281 02/08/2016    BMET    Component Value Date/Time   NA 135 02/08/2016 1110   NA 130 (L) 10/09/2014 0846   K 3.9 02/08/2016 1110   K 4.1 10/09/2014 0846   CL 102 02/08/2016 1110   CL 98 (L) 10/09/2014 0846   CO2 26 02/08/2016 1110   CO2 25 10/09/2014 0846   GLUCOSE 105 (H) 02/08/2016 1110   GLUCOSE 161 (H) 10/09/2014 0846   BUN 20 02/08/2016 1110   BUN 14 10/09/2014 0846   CREATININE 0.82 02/08/2016 1110   CREATININE 0.70 10/09/2014 0846   CALCIUM 9.5 02/08/2016 1110   CALCIUM 9.1 10/09/2014 0846   GFRNONAA >60 02/08/2016 1110   GFRNONAA >60 10/09/2014 0846   GFRAA >60 02/08/2016 1110   GFRAA  >60 10/09/2014 0846   CrCl cannot be calculated (Patient's most recent lab result is older than the maximum 21 days allowed.).  COAG Lab Results  Component Value Date   INR 1.0 03/18/2014   INR 1.0 03/18/2014   PROTIME 13.2 03/18/2014    Radiology No results found.    Assessment/Plan 1.  AAA (abdominal aortic aneurysm) without rupture (441.4  I71.4) No surgery or intervention at this time. The patient has an asymptomatic abdominal aortic aneurysm that is less than 4 cm in maximal diameter.  I have discussed the natural history of abdominal aortic aneurysm and the small risk of rupture for aneurysm less than 5 cm in size.  However, as these small aneurysms tend to enlarge over time, continued surveillance with ultrasound or CT  scan is mandatory.  I have also discussed optimizing medical management with hypertension and lipid control and the importance of abstinence from tobacco.  The patient is also encouraged to exercise a minimum of 30 minutes 4 times a week.   Should the patient develop new onset abdominal or back pain or signs of peripheral embolization they are instructed to seek medical attention immediately and to alert the physician providing care that they have an aneurysm.   The patient voices their understanding.  The patient will return in 12 months with an aortic duplex.  2.  Hypertension, benign (401.1  I10) Encouraged good control as it slows the progression of atherosclerotic disease.  Continue current medications no changes today  3.  Lung Cancer Port-A-Cath removed. Cultures negative at removal. Port can be reinserted if needed.  Continue to follow with oncology as arranged Recent PET scan is negative   Hortencia Pilar, MD  03/31/2016 10:32 AM    This note was created with Dragon medical transcription system.  Any errors from dictation are purely unintentional

## 2016-04-06 ENCOUNTER — Ambulatory Visit (INDEPENDENT_AMBULATORY_CARE_PROVIDER_SITE_OTHER): Payer: Medicare Other | Admitting: Ophthalmology

## 2016-04-14 ENCOUNTER — Encounter: Payer: Self-pay | Admitting: Internal Medicine

## 2016-04-14 ENCOUNTER — Ambulatory Visit (INDEPENDENT_AMBULATORY_CARE_PROVIDER_SITE_OTHER): Payer: Medicare Other | Admitting: Internal Medicine

## 2016-04-14 VITALS — BP 130/70 | HR 88 | Temp 98.0°F | Ht 63.0 in | Wt 158.8 lb

## 2016-04-14 DIAGNOSIS — M810 Age-related osteoporosis without current pathological fracture: Secondary | ICD-10-CM | POA: Diagnosis not present

## 2016-04-14 DIAGNOSIS — R05 Cough: Secondary | ICD-10-CM

## 2016-04-14 DIAGNOSIS — I714 Abdominal aortic aneurysm, without rupture, unspecified: Secondary | ICD-10-CM

## 2016-04-14 DIAGNOSIS — E7800 Pure hypercholesterolemia, unspecified: Secondary | ICD-10-CM

## 2016-04-14 DIAGNOSIS — I10 Essential (primary) hypertension: Secondary | ICD-10-CM

## 2016-04-14 DIAGNOSIS — C3490 Malignant neoplasm of unspecified part of unspecified bronchus or lung: Secondary | ICD-10-CM

## 2016-04-14 DIAGNOSIS — R059 Cough, unspecified: Secondary | ICD-10-CM

## 2016-04-14 DIAGNOSIS — R739 Hyperglycemia, unspecified: Secondary | ICD-10-CM | POA: Diagnosis not present

## 2016-04-14 LAB — BASIC METABOLIC PANEL
BUN: 15 mg/dL (ref 6–23)
CALCIUM: 10.3 mg/dL (ref 8.4–10.5)
CO2: 25 mEq/L (ref 19–32)
Chloride: 102 mEq/L (ref 96–112)
Creatinine, Ser: 0.85 mg/dL (ref 0.40–1.20)
GFR: 82.09 mL/min (ref >60.00)
Glucose, Bld: 112 mg/dL — ABNORMAL HIGH (ref 70–99)
POTASSIUM: 4.1 meq/L (ref 3.5–5.1)
SODIUM: 137 meq/L (ref 135–145)

## 2016-04-14 LAB — TSH: TSH: 0.64 u[IU]/mL (ref 0.35–4.50)

## 2016-04-14 LAB — LIPID PANEL
CHOL/HDL RATIO: 4
Cholesterol: 172 mg/dL (ref 0–200)
HDL: 44.2 mg/dL (ref >39.00)
LDL CALC: 104 mg/dL — AB (ref 0–99)
NONHDL: 128.09
Triglycerides: 121 mg/dL (ref 0.0–149.0)
VLDL: 24.2 mg/dL (ref 0.0–40.0)

## 2016-04-14 LAB — HEMOGLOBIN A1C: Hgb A1c MFr Bld: 6.5 % (ref 4.6–6.5)

## 2016-04-14 LAB — VITAMIN D 25 HYDROXY (VIT D DEFICIENCY, FRACTURES): VITD: 27.5 ng/mL — AB (ref 30.00–100.00)

## 2016-04-14 MED ORDER — CHOLECALCIFEROL 10 MCG (400 UNIT) PO TABS: 400.0000 [IU] | ORAL_TABLET | Freq: Every day | ORAL | 0 refills | Status: AC

## 2016-04-14 MED ORDER — FLUTICASONE PROPIONATE 50 MCG/ACT NA SUSP: 2.0000 | Freq: Every day | NASAL | 0 refills | Status: DC

## 2016-04-14 MED ORDER — OLMESARTAN MEDOXOMIL-HCTZ 20-12.5 MG PO TABS: 1.0000 | ORAL_TABLET | Freq: Every day | ORAL | 1 refills | Status: DC

## 2016-04-14 MED ORDER — PANTOPRAZOLE SODIUM 40 MG PO TBEC: 40.0000 mg | DELAYED_RELEASE_TABLET | Freq: Every day | ORAL | 1 refills | Status: DC

## 2016-04-14 NOTE — Progress Notes (Signed)
Patient ID: Heather Cooper, female   DOB: [birth year], 82 y.o.   MRN: [id number]   Subjective:    Patient ID: Heather Cooper, female    DOB: [birth year], 82 y.o.   MRN: [id number]  HPI  Patient here for a scheduled follow up.  She is accompanied by her daughter.  History obtained from both of them.  She is doing relatively well.  Has noticed some cough.  Cough is some better.  No sob.  No chest pain.  No sob.  No deep chest congestion.  No acid reflux.  No abdominal pain or cramping.  Bowels stable.  Daughter is concerned regarding her weight gain.  Weight is stable for the last several months.     Past Medical History:  Diagnosis Date  . Chicken pox   . Hypercalcemia    familial hypocalciuric hypercalcemia  . Hypercholesterolemia   . Hypertension   . Lung cancer ([place])   . Osteoporosis   . Thyroid disease    Past Surgical History:  Procedure Laterality Date  . ABDOMINAL HYSTERECTOMY     partial  . CHOLECYSTECTOMY    . transvaginal hysterectomy  04/18/05   with anterior colporrhaphy   Family History  Problem Relation Age of Onset  . Stroke Mother   . Hypertension Mother   . Prostate cancer Father   . Cancer Father     prostate  . Heart disease Brother     s/p CABG  . Colon cancer Neg Hx    Social History   Social History  . Marital status: Married    Spouse name: N/A  . Number of children: 3  . Years of education: N/A   Social History Main Topics  . Smoking status: Former Smoker    Quit date: 06/13/1997  . Smokeless tobacco: Never Used  . Alcohol use No  . Drug use: No  . Sexual activity: Not Asked   Other Topics Concern  . None   Social History Narrative  . None    Outpatient Encounter Prescriptions as of 04/14/2016  Medication Sig  . nystatin cream (MYCOSTATIN) Apply 1 application topically 2 (two) times daily.  . [DISCONTINUED] cholecalciferol (VITAMIN D) 400 UNITS TABS Take 400 Units by mouth daily.  . [DISCONTINUED] fluticasone (FLONASE) 50 MCG/ACT  nasal spray Place 2 sprays into both nostrils daily.  . [DISCONTINUED] olmesartan-hydrochlorothiazide (BENICAR HCT) 20-12.5 MG tablet Take 1 tablet by mouth daily.  . [DISCONTINUED] pantoprazole (PROTONIX) 40 MG tablet TAKE 1 TABLET BY MOUTH ONCE DAILY.  . cholecalciferol (VITAMIN D) 400 units TABS tablet Take 1 tablet (400 Units total) by mouth daily.  . fluticasone (FLONASE) 50 MCG/ACT nasal spray Place 2 sprays into both nostrils daily.  [name] olmesartan-hydrochlorothiazide (BENICAR HCT) 20-12.5 MG tablet Take 1 tablet by mouth daily.  . pantoprazole (PROTONIX) 40 MG tablet Take 1 tablet (40 mg total) by mouth daily.   Facility-Administered Encounter Medications as of 04/14/2016  Medication  . sodium chloride 0.9 % 1,000 mL with potassium chloride 20 mEq, magnesium sulfate 2 g infusion  . sodium chloride 0.9 % injection 10 mL    Review of Systems  Constitutional: Negative for appetite change and unexpected weight change.  HENT: Negative for congestion and sinus pressure.   Respiratory: Positive for cough. Negative for chest tightness and shortness of breath.   Cardiovascular: Negative for chest pain, palpitations and leg swelling.  Gastrointestinal: Negative for abdominal pain, diarrhea, nausea and vomiting.  Genitourinary: Negative for difficulty urinating and dysuria.  Musculoskeletal: Negative for back pain and joint swelling.  Skin: Negative for color change and rash.  Neurological: Negative for dizziness, light-headedness and headaches.  Psychiatric/Behavioral: Negative for agitation and dysphoric mood.       Objective:    Physical Exam  Constitutional: She appears well-developed and well-nourished. No distress.  HENT:  Nose: Nose normal.  Mouth/Throat: Oropharynx is clear and moist.  Neck: Neck supple. No thyromegaly present.  Cardiovascular: Normal rate and regular rhythm.   Pulmonary/Chest: Breath sounds normal. No respiratory distress. She has no wheezes.  Abdominal:  Soft. Bowel sounds are normal. There is no tenderness.  Musculoskeletal: She exhibits no edema or tenderness.  Lymphadenopathy:    She has no cervical adenopathy.  Skin: No rash noted. No erythema.  Psychiatric: She has a normal mood and affect. Her behavior is normal.    BP 130/70   Pulse 88   Temp 98 F (36.7 C) (Oral)   Ht 5' 3"  (1.6 m)   Wt 158 lb 12.8 oz (72 kg)   SpO2 97%   BMI 28.13 kg/m  Wt Readings from Last 3 Encounters:  04/14/16 158 lb 12.8 oz (72 kg)  03/31/16 158 lb (71.7 kg)  02/08/16 159 lb 7 oz (72.3 kg)     Lab Results  Component Value Date   WBC 8.0 02/08/2016   HGB 11.9 (L) 02/08/2016   HCT 35.0 02/08/2016   PLT 281 02/08/2016   GLUCOSE 112 (H) 04/14/2016   CHOL 172 04/14/2016   TRIG 121.0 04/14/2016   HDL 44.20 04/14/2016   LDLCALC 104 (H) 04/14/2016   ALT 13 (L) 02/08/2016   AST 19 02/08/2016   NA 137 04/14/2016   K 4.1 04/14/2016   CL 102 04/14/2016   CREATININE 0.85 04/14/2016   BUN 15 04/14/2016   CO2 25 04/14/2016   TSH 0.64 04/14/2016   INR 1.0 03/18/2014   INR 1.0 03/18/2014   HGBA1C 6.5 04/14/2016    Nm Pet Image Restag (ps) Skull Base To Thigh  Result Date: 02/05/2016 CLINICAL DATA:  Subsequent treatment strategy for lung cancer. EXAM: NUCLEAR MEDICINE PET SKULL BASE TO THIGH TECHNIQUE: 12.2 mCi F-18 FDG was injected intravenously. Full-ring PET imaging was performed from the skull base to thigh after the radiotracer. CT data was obtained and used for attenuation correction and anatomic localization. FASTING BLOOD GLUCOSE:  Value: 109 mg/dl COMPARISON:  PET-CT dated 08/04/2015 FINDINGS: NECK No hypermetabolic lymph nodes in the neck. CHEST Radiation changes in the medial right upper lobe and right perihilar region (series 3/image 75), max SUV 2.8. 7 mm nodule in the posterior right lower lobe (series 3/image 82), max SUV 0.7, although beneath the size threshold for PET sensitivity. 8 mm short axis low right paratracheal node (series  3/image 34), max SUV 3.8, previously 6 mm but non FDG avid, suspicious for nodal metastasis. 7 mm short axis subcarinal node (series 3/image 80), max SUV 3.3. Associated mild hypermetabolism in the right hilar region, max SUV 3.4, indeterminate. ABDOMEN/PELVIS No abnormal hypermetabolic activity within the liver, pancreas, adrenal glands, or spleen. No hypermetabolic lymph nodes in the abdomen or pelvis. 3.3 x 3.7 cm infrarenal abdominal aortic aneurysm (series 3/image 157), previously 3.3 x 3.9 cm. Atherosclerotic calcifications the abdominal aorta and branch vessels. Status post cholecystectomy and hysterectomy. Colonic diverticulosis, without evidence of diverticulitis. SKELETON No focal hypermetabolic activity to suggest skeletal metastasis. IMPRESSION: Radiation changes in the medial right upper lobe and perihilar region. Small mediastinal lymph nodes measuring up to 8 mm  short axis with new hypermetabolism, suspicious for nodal metastases. 3.3 x 3.7 cm infrarenal abdominal aortic aneurysm, grossly unchanged. Recommend followup by ultrasound in 2 years. This recommendation follows ACR consensus guidelines: White Paper of the ACR Incidental Findings Committee II on Vascular Findings. J Am Coll Radiol 2013; 10:789-794. Electronically Signed   By: Julian Hy M.D.   On: 02/05/2016 13:50       Assessment & Plan:   Problem List Items Addressed This Visit    Abdominal aortic aneurysm ([place])    Evaluated 03/31/16 - by Dr Ronalee Belts.  Stable.  Recommended f/u in 12 months.        Relevant Medications   olmesartan-hydrochlorothiazide (BENICAR HCT) 20-12.5 MG tablet   Carcinoma of lung ([place])    Followed at the cancer center.  Stable.       Cough    Moving good air through her lungs.  No increased congestion.  Some drainage.  Restart flonase.  Also claritin.  Took for a couple of days and symptoms improved.  Restart.  Follow.        Hypercalcemia    Followed by Dr Forde Dandy.        Hypercholesterolemia    Low cholesterol diet and exercise.  Follow lipid panel.        Relevant Medications   olmesartan-hydrochlorothiazide (BENICAR HCT) 20-12.5 MG tablet   Other Relevant Orders   Lipid panel (Completed)   Hyperglycemia    Low carb diet.  Discussed with her today.  Follow met b and a1c.        Relevant Orders   Hemoglobin A1c (Completed)   Basic metabolic panel (Completed)   Hypertension - Primary    Blood pressure under good control.  Continue same medication regimen.  Follow pressures.  Follow metabolic panel.        Relevant Medications   olmesartan-hydrochlorothiazide (BENICAR HCT) 20-12.5 MG tablet   Other Relevant Orders   TSH (Completed)   Osteoporosis   Relevant Medications   cholecalciferol (VITAMIN D) 400 units TABS tablet   Other Relevant Orders   VITAMIN D 25 Hydroxy (Vit-D Deficiency, Fractures) (Completed)       [name], MD

## 2016-04-14 NOTE — Progress Notes (Signed)
Pre visit review using our clinic review tool, if applicable. No additional management support is needed unless otherwise documented below in the visit note. 

## 2016-04-14 NOTE — Patient Instructions (Signed)
Use flonase - 2 sprays each nostril one time per day.  Do this in the evening.    claritin - daily (as needed)

## 2016-04-17 ENCOUNTER — Encounter: Payer: Self-pay | Admitting: Internal Medicine

## 2016-04-17 NOTE — Assessment & Plan Note (Signed)
Moving good air through her lungs.  No increased congestion.  Some drainage.  Restart flonase.  Also claritin.  Took for a couple of days and symptoms improved.  Restart.  Follow.

## 2016-04-17 NOTE — Assessment & Plan Note (Signed)
Evaluated 03/31/16 - by Dr Ronalee Belts.  Stable.  Recommended f/u in 12 months.

## 2016-04-17 NOTE — Assessment & Plan Note (Signed)
Followed at the cancer center.  Stable.

## 2016-04-17 NOTE — Assessment & Plan Note (Signed)
Blood pressure under good control.  Continue same medication regimen.  Follow pressures.  Follow metabolic panel.   

## 2016-04-17 NOTE — Assessment & Plan Note (Signed)
Low cholesterol diet and exercise.  Follow lipid panel.   

## 2016-04-17 NOTE — Assessment & Plan Note (Signed)
Followed by Dr South.   

## 2016-04-17 NOTE — Assessment & Plan Note (Signed)
Low carb diet.  Discussed with her today.  Follow met b and a1c.

## 2016-04-18 ENCOUNTER — Ambulatory Visit (INDEPENDENT_AMBULATORY_CARE_PROVIDER_SITE_OTHER): Payer: Medicare Other | Admitting: Ophthalmology

## 2016-04-18 DIAGNOSIS — H43813 Vitreous degeneration, bilateral: Secondary | ICD-10-CM

## 2016-04-18 DIAGNOSIS — H353132 Nonexudative age-related macular degeneration, bilateral, intermediate dry stage: Secondary | ICD-10-CM

## 2016-04-18 DIAGNOSIS — H35033 Hypertensive retinopathy, bilateral: Secondary | ICD-10-CM | POA: Diagnosis not present

## 2016-04-18 DIAGNOSIS — I10 Essential (primary) hypertension: Secondary | ICD-10-CM

## 2016-05-13 ENCOUNTER — Other Ambulatory Visit: Payer: Self-pay | Admitting: Pharmacist

## 2016-05-13 NOTE — Patient Outreach (Signed)
Outreach call to [employer] regarding her request for follow up from the [facility] Medication Adherence Campaign. Called and spoke with patient. HIPAA identifiers verified and verbal consent received.  [name] reports that she is busy at the moment and asks that I call her back.  Will call Ms. Kwan again on Monday, 05/16/16.  Harlow Asa, PharmD Clinical Pharmacist [place] [facility] [phone]

## 2016-05-16 ENCOUNTER — Other Ambulatory Visit: Payer: Self-pay | Admitting: Pharmacist

## 2016-05-16 ENCOUNTER — Ambulatory Visit: Payer: Self-pay | Admitting: Pharmacist

## 2016-05-16 NOTE — Patient Outreach (Signed)
Outreach call again to [employer] regarding her request for follow up from the [facility] Medication Adherence Campaign. Called and spoke with patient. HIPAA identifiers verified and verbal consent received.  Patient reports that she has been taking her blood pressure medication daily as directed. Denies any missed doses or any barriers to taking her medications such as cost or side effects.   Patient reports that she has no medication questions or concerns at this time.  Harlow Asa, PharmD Clinical Pharmacist [place] [facility] [phone]

## 2016-05-18 ENCOUNTER — Encounter: Payer: Self-pay | Admitting: Internal Medicine

## 2016-05-18 DIAGNOSIS — Z0000 Encounter for general adult medical examination without abnormal findings: Secondary | ICD-10-CM | POA: Insufficient documentation

## 2016-06-07 ENCOUNTER — Telehealth: Payer: Self-pay | Admitting: *Deleted

## 2016-06-07 NOTE — Telephone Encounter (Signed)
Called ar med for a cough she has had for a week. Was given a Zpak by Urgent Care. Bringing up clear mucous denies fever. States her husband has been sick with same. She will try OTC meds. Mucinex and Delsym and call back if not any better

## 2016-06-10 ENCOUNTER — Other Ambulatory Visit: Payer: Self-pay | Admitting: Nurse Practitioner

## 2016-06-14 ENCOUNTER — Ambulatory Visit
Admission: RE | Admit: 2016-06-14 | Discharge: 2016-06-14 | Disposition: A | Discharge status: Home / Self Care | Payer: Medicare Other | Source: Ambulatory Visit | Attending: Internal Medicine | Admitting: Internal Medicine

## 2016-06-14 DIAGNOSIS — I714 Abdominal aortic aneurysm, without rupture: Secondary | ICD-10-CM | POA: Insufficient documentation

## 2016-06-14 DIAGNOSIS — K760 Fatty (change of) liver, not elsewhere classified: Secondary | ICD-10-CM | POA: Diagnosis not present

## 2016-06-14 DIAGNOSIS — K449 Diaphragmatic hernia without obstruction or gangrene: Secondary | ICD-10-CM | POA: Diagnosis not present

## 2016-06-14 DIAGNOSIS — Z90710 Acquired absence of both cervix and uterus: Secondary | ICD-10-CM | POA: Diagnosis not present

## 2016-06-14 DIAGNOSIS — I700 Atherosclerosis of aorta: Secondary | ICD-10-CM | POA: Diagnosis not present

## 2016-06-14 DIAGNOSIS — Z9049 Acquired absence of other specified parts of digestive tract: Secondary | ICD-10-CM | POA: Diagnosis not present

## 2016-06-14 DIAGNOSIS — I2510 Atherosclerotic heart disease of native coronary artery without angina pectoris: Secondary | ICD-10-CM | POA: Insufficient documentation

## 2016-06-14 DIAGNOSIS — K5730 Diverticulosis of large intestine without perforation or abscess without bleeding: Secondary | ICD-10-CM | POA: Diagnosis not present

## 2016-06-14 DIAGNOSIS — C3411 Malignant neoplasm of upper lobe, right bronchus or lung: Secondary | ICD-10-CM | POA: Insufficient documentation

## 2016-06-14 DIAGNOSIS — D3502 Benign neoplasm of left adrenal gland: Secondary | ICD-10-CM | POA: Insufficient documentation

## 2016-06-14 LAB — GLUCOSE, CAPILLARY: Glucose-Capillary: 113 mg/dL — ABNORMAL HIGH (ref 65–99)

## 2016-06-14 MED ORDER — FLUDEOXYGLUCOSE F - 18 (FDG) INJECTION
12.0000 | Freq: Once | INTRAVENOUS | Status: AC | PRN
  Administered 2016-06-14: 12.28 via INTRAVENOUS

## 2016-06-16 ENCOUNTER — Inpatient Hospital Stay: Payer: Medicare Other | Attending: Internal Medicine | Admitting: Internal Medicine

## 2016-06-16 ENCOUNTER — Inpatient Hospital Stay: Payer: Medicare Other

## 2016-06-16 ENCOUNTER — Other Ambulatory Visit: Payer: Self-pay

## 2016-06-16 VITALS — BP 115/73 | HR 81 | Temp 97.2°F | Wt 158.0 lb

## 2016-06-16 DIAGNOSIS — Z8042 Family history of malignant neoplasm of prostate: Secondary | ICD-10-CM | POA: Insufficient documentation

## 2016-06-16 DIAGNOSIS — Z79899 Other long term (current) drug therapy: Secondary | ICD-10-CM | POA: Insufficient documentation

## 2016-06-16 DIAGNOSIS — Z9221 Personal history of antineoplastic chemotherapy: Secondary | ICD-10-CM | POA: Insufficient documentation

## 2016-06-16 DIAGNOSIS — K5730 Diverticulosis of large intestine without perforation or abscess without bleeding: Secondary | ICD-10-CM | POA: Diagnosis not present

## 2016-06-16 DIAGNOSIS — Z923 Personal history of irradiation: Secondary | ICD-10-CM | POA: Diagnosis not present

## 2016-06-16 DIAGNOSIS — I700 Atherosclerosis of aorta: Secondary | ICD-10-CM | POA: Insufficient documentation

## 2016-06-16 DIAGNOSIS — Z7952 Long term (current) use of systemic steroids: Secondary | ICD-10-CM | POA: Insufficient documentation

## 2016-06-16 DIAGNOSIS — E079 Disorder of thyroid, unspecified: Secondary | ICD-10-CM | POA: Diagnosis not present

## 2016-06-16 DIAGNOSIS — K449 Diaphragmatic hernia without obstruction or gangrene: Secondary | ICD-10-CM | POA: Insufficient documentation

## 2016-06-16 DIAGNOSIS — E7800 Pure hypercholesterolemia, unspecified: Secondary | ICD-10-CM | POA: Insufficient documentation

## 2016-06-16 DIAGNOSIS — K760 Fatty (change of) liver, not elsewhere classified: Secondary | ICD-10-CM | POA: Diagnosis not present

## 2016-06-16 DIAGNOSIS — I714 Abdominal aortic aneurysm, without rupture: Secondary | ICD-10-CM | POA: Insufficient documentation

## 2016-06-16 DIAGNOSIS — C3411 Malignant neoplasm of upper lobe, right bronchus or lung: Secondary | ICD-10-CM | POA: Diagnosis present

## 2016-06-16 DIAGNOSIS — Z8679 Personal history of other diseases of the circulatory system: Secondary | ICD-10-CM | POA: Insufficient documentation

## 2016-06-16 DIAGNOSIS — I10 Essential (primary) hypertension: Secondary | ICD-10-CM | POA: Insufficient documentation

## 2016-06-16 DIAGNOSIS — D3502 Benign neoplasm of left adrenal gland: Secondary | ICD-10-CM | POA: Insufficient documentation

## 2016-06-16 DIAGNOSIS — C7801 Secondary malignant neoplasm of right lung: Secondary | ICD-10-CM | POA: Diagnosis not present

## 2016-06-16 DIAGNOSIS — Y842 Radiological procedure and radiotherapy as the cause of abnormal reaction of the patient, or of later complication, without mention of misadventure at the time of the procedure: Secondary | ICD-10-CM | POA: Diagnosis not present

## 2016-06-16 DIAGNOSIS — Z9049 Acquired absence of other specified parts of digestive tract: Secondary | ICD-10-CM | POA: Insufficient documentation

## 2016-06-16 DIAGNOSIS — Z87891 Personal history of nicotine dependence: Secondary | ICD-10-CM | POA: Diagnosis not present

## 2016-06-16 LAB — COMPREHENSIVE METABOLIC PANEL
ALBUMIN: 4.1 g/dL (ref 3.5–5.0)
ALK PHOS: 84 U/L (ref 38–126)
ALT: 17 U/L (ref 14–54)
AST: 24 U/L (ref 15–41)
Anion gap: 9 (ref 5–15)
BILIRUBIN TOTAL: 0.4 mg/dL (ref 0.3–1.2)
BUN: 19 mg/dL (ref 6–20)
CO2: 26 mmol/L (ref 22–32)
Calcium: 9.8 mg/dL (ref 8.9–10.3)
Chloride: 101 mmol/L (ref 101–111)
Creatinine, Ser: 0.86 mg/dL (ref 0.44–1.00)
GFR calc Af Amer: >60 mL/min (ref >60)
GLUCOSE: 145 mg/dL — AB (ref 65–99)
POTASSIUM: 3.9 mmol/L (ref 3.5–5.1)
Sodium: 136 mmol/L (ref 135–145)
TOTAL PROTEIN: 7.9 g/dL (ref 6.5–8.1)

## 2016-06-16 LAB — CBC WITH DIFFERENTIAL/PLATELET
BASOS ABS: 0.1 10*3/uL (ref 0–0.1)
Basophils Relative: 1 %
EOS ABS: 0.2 10*3/uL (ref 0–0.7)
EOS PCT: 3 %
HCT: 36.6 % (ref 35.0–47.0)
Hemoglobin: 12.5 g/dL (ref 12.0–16.0)
Lymphocytes Relative: 29 %
Lymphs Abs: 2.3 10*3/uL (ref 1.0–3.6)
MCH: 27.4 pg (ref 26.0–34.0)
MCHC: 34.1 g/dL (ref 32.0–36.0)
MCV: 80.5 fL (ref 80.0–100.0)
MONO ABS: 0.5 10*3/uL (ref 0.2–0.9)
Monocytes Relative: 7 %
Neutro Abs: 4.7 10*3/uL (ref 1.4–6.5)
Neutrophils Relative %: 60 %
PLATELETS: 275 10*3/uL (ref 150–440)
RBC: 4.55 MIL/uL (ref 3.80–5.20)
RDW: 14.6 % — AB (ref 11.5–14.5)
WBC: 7.8 10*3/uL (ref 3.6–11.0)

## 2016-06-16 MED ORDER — METHYLPREDNISOLONE 4 MG PO TBPK: ORAL_TABLET | ORAL | 0 refills | Status: DC

## 2016-06-16 NOTE — Progress Notes (Signed)
Patient here today for follow up.  Pt states no new concerns today

## 2016-06-16 NOTE — Progress Notes (Signed)
[place] OFFICE PROGRESS NOTE  Patient Care Team: [name], MD as PCP - General (Internal Medicine)  No matching staging information was found for the patient.   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  Starting radiation and chemotherapy from April 14, 2014 Patient was started on carboplatinum andTaxol Herve were developed an allergic reaction to Taxol so would be changed to Abraxane 3.patient has finished 6 cycles of weekly chemotherapy with carboplatinum  and radiation therapy(May 28, 2014) 4.started on  NIVOLULAMAB because of persistent disease July 02, 2014. 5.  NIVOLULAMAB was discontinued because of persistent diarrhea in July of 2016.  August of 2016 CT scan was stable so no further chemotherapy  # AUG 25th PET- STABLE RUL MASS [radiation fibrosis; <1cm ? Mediastinal recurrence]; repeat PET in 4 m  # AAA/ 3.3x 3.9 Stable [Dr.Schnier]     Carcinoma of lung ([place])    Cancer of upper lobe of right lung ([place])   02/08/2016 Initial Diagnosis    Cancer of upper lobe of right lung ([place])       INTERVAL HISTORY:  Heather Cooper 83 y.o.  female pleasant patient above history of Right upper lobe adenoca cell Stage IV lung cancer [contralateral lower lobe lung nodule]- currently on surveillance is here to review the results of her restaging PET scan.  She is accompanied by her son.   Patient had a recent episode of cugh that was treated with antibiotics.   The cough has improved; however not completely resolved. Patient denies any nausea vomiting. Denies any unusual shortness of breath. . Not losing any  weight. No headaches. No tingling and numbness. Appetite is good.  REVIEW OF SYSTEMS:  A complete 10 point review of system is done which is negative except mentioned above/history of present illness.   PAST MEDICAL HISTORY :  Past Medical History:  Diagnosis Date  . Chicken pox   . Hypercalcemia    familial hypocalciuric hypercalcemia  . Hypercholesterolemia   . Hypertension   . Lung cancer ([place])   . Osteoporosis   . Thyroid disease     PAST SURGICAL HISTORY :   Past Surgical History:  Procedure Laterality Date  . ABDOMINAL HYSTERECTOMY     partial  . CHOLECYSTECTOMY    . transvaginal hysterectomy  04/18/05   with anterior colporrhaphy    FAMILY HISTORY :   Family History  Problem Relation Age of Onset  . Stroke Mother   . Hypertension Mother   . Prostate cancer Father   . Cancer Father     prostate  . Heart disease Brother     s/p CABG  . Colon cancer Neg Hx     SOCIAL HISTORY:   Social History  Substance Use Topics  . Smoking status: Former Smoker    Quit date: 06/13/1997  . Smokeless tobacco: Never Used  . Alcohol use No    ALLERGIES:  is allergic to paclitaxel.  MEDICATIONS:  Current Outpatient Prescriptions  Medication Sig Dispense Refill  . cholecalciferol (VITAMIN D) 400 units TABS tablet Take 1 tablet (400 Units total) by mouth daily. 90 tablet 0  .  fluticasone (FLONASE) 50 MCG/ACT nasal spray Place 2 sprays into both nostrils daily. 48 g 0  . nystatin cream (MYCOSTATIN) Apply 1 application topically 2 (two) times daily. 30 g 0  . olmesartan-hydrochlorothiazide (BENICAR HCT) 20-12.5 MG tablet Take 1 tablet by mouth daily. 90 tablet 1  . pantoprazole (PROTONIX) 40 MG tablet Take 1 tablet (40 mg total) by mouth daily. 90 tablet 1  . methylPREDNISolone (MEDROL DOSEPAK) 4 MG TBPK tablet Use as directed. 21 tablet 0   No current facility-administered medications for this visit.    Facility-Administered Medications Ordered in Other Visits  Medication  Dose Route Frequency Provider Last Rate Last Dose  . sodium chloride 0.9 % 1,000 mL with potassium chloride 20 mEq, magnesium sulfate 2 g infusion   Intravenous Continuous Forest Gleason, MD   Stopped at 12/04/14 1300    PHYSICAL EXAMINATION: ECOG PERFORMANCE STATUS: 0 - Asymptomatic  BP 115/73 (BP Location: Left Arm, Patient Position: Sitting)   Pulse 81   Temp 97.2 F (36.2 C) (Tympanic)   Wt 158 lb (71.7 kg)   BMI 27.99 kg/m   Filed Weights   06/16/16 1046  Weight: 158 lb (71.7 kg)    GENERAL: Well-nourished well-developed; Alert, no distress and comfortable.   With son/ family.  EYES: no pallor or icterus OROPHARYNX: no thrush or ulceration; good dentition  NECK: supple, no masses felt LYMPH:  no palpable lymphadenopathy in the cervical, axillary or inguinal regions LUNGS: clear to auscultation and  No wheeze or crackles HEART/CVS: regular rate & rhythm and no murmurs; No lower extremity edema ABDOMEN:abdomen soft, non-tender and normal bowel sounds Musculoskeletal:no cyanosis of digits and no clubbing  PSYCH: alert & oriented x 3 with fluent speech NEURO: no focal motor/sensory deficits SKIN:  no rashes or significant lesions  LABORATORY DATA:  I have reviewed the data as listed    Component Value Date/Time   NA 136 06/16/2016 1000   NA 130 (L) 10/09/2014 0846   K 3.9 06/16/2016 1000   K 4.1 10/09/2014 0846   CL 101 06/16/2016 1000   CL 98 (L) 10/09/2014 0846   CO2 26 06/16/2016 1000   CO2 25 10/09/2014 0846   GLUCOSE 145 (H) 06/16/2016 1000   GLUCOSE 161 (H) 10/09/2014 0846   BUN 19 06/16/2016 1000   BUN 14 10/09/2014 0846   CREATININE 0.86 06/16/2016 1000   CREATININE 0.70 10/09/2014 0846   CALCIUM 9.8 06/16/2016 1000   CALCIUM 9.1 10/09/2014 0846   PROT 7.9 06/16/2016 1000   PROT 7.3 10/09/2014 0846   ALBUMIN 4.1 06/16/2016 1000   ALBUMIN 3.6 10/09/2014 0846   AST 24 06/16/2016 1000   AST 16 10/09/2014 0846   ALT 17 06/16/2016 1000   ALT 13 (L)  10/09/2014 0846   ALKPHOS 84 06/16/2016 1000   ALKPHOS 70 10/09/2014 0846   BILITOT 0.4 06/16/2016 1000   BILITOT 1.0 10/09/2014 0846   GFRNONAA >60 06/16/2016 1000   GFRNONAA >60 10/09/2014 0846   GFRAA >60 06/16/2016 1000   GFRAA >60 10/09/2014 0846    No results found for: SPEP, UPEP  Lab Results  Component Value Date   WBC 7.8 06/16/2016   NEUTROABS 4.7 06/16/2016   HGB 12.5 06/16/2016   HCT 36.6 06/16/2016   MCV 80.5 06/16/2016   PLT 275 06/16/2016      Chemistry      Component Value Date/Time   NA 136 06/16/2016 1000   NA 130 (L) 10/09/2014 0846   K 3.9 06/16/2016  1000   K 4.1 10/09/2014 0846   CL 101 06/16/2016 1000   CL 98 (L) 10/09/2014 0846   CO2 26 06/16/2016 1000   CO2 25 10/09/2014 0846   BUN 19 06/16/2016 1000   BUN 14 10/09/2014 0846   CREATININE 0.86 06/16/2016 1000   CREATININE 0.70 10/09/2014 0846   GLU 111 03/18/2014 1048      Component Value Date/Time   CALCIUM 9.8 06/16/2016 1000   CALCIUM 9.1 10/09/2014 0846   ALKPHOS 84 06/16/2016 1000   ALKPHOS 70 10/09/2014 0846   AST 24 06/16/2016 1000   AST 16 10/09/2014 0846   ALT 17 06/16/2016 1000   ALT 13 (L) 10/09/2014 0846   BILITOT 0.4 06/16/2016 1000   BILITOT 1.0 10/09/2014 0846       IMPRESSION: 1. 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 2. No definite hypermetabolic metastatic disease. 3. Nonspecific mildly hypermetabolic nonenlarged right paratracheal, subcarinal and right hilar lymph nodes demonstrate no growth or increased metabolism since 02/05/2016, suggesting reactive nodes in this patient who has not received interval therapy. Continued PET-CT surveillance is advised. 4. Additional findings include aortic atherosclerosis, stable 3.8 cm infrarenal abdominal aortic aneurysm, coronary atherosclerosis, small hiatal hernia, diffuse hepatic steatosis, stable left adrenal adenoma and distal colonic  diverticulosis.   Electronically Signed   By: Ilona Sorrel M.D.   On: 06/14/2016 13:40  RADIOGRAPHIC STUDIES: I have personally reviewed the radiological images as listed and agreed with the findings in the report. Nm Pet Image Restag (ps) Skull Base To Thigh  Result Date: 06/14/2016 CLINICAL DATA:  Subsequent treatment strategy for stage IV right upper lobe lung cancer diagnosed October 2015 status post chemoradiation therapy, currently on surveillance presenting for restaging. EXAM: NUCLEAR MEDICINE PET SKULL BASE TO THIGH TECHNIQUE: 12.3 mCi F-18 FDG was injected intravenously. Full-ring PET imaging was performed from the skull base to thigh after the radiotracer. CT data was obtained and used for attenuation correction and anatomic localization. FASTING BLOOD GLUCOSE:  Value: 113 mg/dl COMPARISON:  02/05/2016 PET-CT. FINDINGS: NECK No hypermetabolic lymph nodes in the neck. Stable heterogeneous thyroid gland with no hypermetabolic thyroid nodules. CHEST Left main, left anterior descending, left circumflex and right coronary atherosclerosis. Stable trace pericardial effusion/ thickening. Atherosclerotic nonaneurysmal thoracic aorta. No hypermetabolic axillary lymph nodes. Mildly hypermetabolic 0.7 cm right paratracheal lymph node with max SUV 3.2 (series 3/image 79), previously 0.7 cm with max SUV 3.8, stable in size and slightly decreased in metabolism. Mildly hypermetabolic 0.6 cm subcarinal node with max SUV 3.4 (series 3/image 85), previously 0.7 cm with max SUV 3.3, not appreciably changed in size or metabolism. Mild focal hypermetabolism in the anterior right hilum with max SUV 3.4, previous max SUV 3.4, stable. No new hypermetabolic or enlarged mediastinal or hilar nodes. No pleural effusions. No pneumothorax. Stable sharply marginated consolidation, volume loss and distortion in the medial and parahilar right upper lobe without hypermetabolism (max SUV 2.8, which is below the mediastinal blood  pool activity, and is stable since 02/05/2016), consistent with radiation fibrosis. Posterior right lower lobe 6 mm pulmonary nodule (series 3/image 87) is below PET resolution and not associated with significant metabolism, previously 7 mm, not appreciably changed. No acute consolidative airspace disease or new significant pulmonary nodules. ABDOMEN/PELVIS No abnormal hypermetabolic activity within the liver, pancreas, adrenal glands, or spleen. No hypermetabolic lymph nodes in the abdomen or pelvis. Small hiatal hernia. Diffuse hepatic steatosis. Cholecystectomy. Stable dilated common bile duct (13  mm diameter) with no significant intrahepatic biliary ductal dilatation and with no radiopaque choledocholithiasis, most consistent with stable postcholecystectomy effect. Stable 1.0 cm left adrenal adenoma. Moderate distal colonic diverticulosis, most prominent at the junction of the descending and sigmoid colon. Hysterectomy. Stable 3.8 cm infrarenal abdominal aortic aneurysm. Aortic atherosclerosis. SKELETON No focal hypermetabolic activity to suggest skeletal metastasis. IMPRESSION: 1. 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 2. No definite hypermetabolic metastatic disease. 3. Nonspecific mildly hypermetabolic nonenlarged right paratracheal, subcarinal and right hilar lymph nodes demonstrate no growth or increased metabolism since 02/05/2016, suggesting reactive nodes in this patient who has not received interval therapy. Continued PET-CT surveillance is advised. 4. Additional findings include aortic atherosclerosis, stable 3.8 cm infrarenal abdominal aortic aneurysm, coronary atherosclerosis, small hiatal hernia, diffuse hepatic steatosis, stable left adrenal adenoma and distal colonic diverticulosis. Electronically Signed   By: Ilona Sorrel M.D.   On: 06/14/2016 13:40     ASSESSMENT & PLAN:  Cancer of upper lobe of right lung ([facility]) Right upper  lobe lung cancer adeno ca- Stage IV [contralateral left lower lobe lung nodule] status post chemoradiation-s/p Nivo currently on surveillance. Jan 2018- Restaging PET scan-stable right upper lobe mass/hilar mass; less than centimeter mediastinal adenopathy-STABLE. Clinically asymptomatic.   # cough likely bronchitis; s/p anti-biotics- recommend medrol dose pack.   # AAA- stable at 3.7 cm- follow up with Dr.Schneir. Stable.   # # I reviewed the blood work- with the patient/son in detail; also reviewed the imaging independently [as summarized above]; and with the patient in detail.   # 25 minutes face-to-face with the patient discussing the above plan of care; more than 50% of time spent on prognosis/ natural history; counseling and coordination.   Orders Placed This Encounter  Procedures  . NM PET Image Restag (PS) Skull Base To Thigh    Standing Status:   Future    Standing Expiration Date:   08/16/2017    Order Specific Question:   Reason for Exam (SYMPTOM  OR DIAGNOSIS REQUIRED)    Answer:   lung cancer    Order Specific Question:   Preferred imaging location?    Answer:   [facility]  . Comprehensive metabolic panel    Standing Status:   Future    Standing Expiration Date:   06/16/2017  . CBC with Differential    Standing Status:   Future    Standing Expiration Date:   06/16/2017   All questions were answered. The patient knows to call the clinic with any problems, questions or concerns.      Cammie Sickle, MD 06/16/2016 11:44 AM

## 2016-06-16 NOTE — Assessment & Plan Note (Addendum)
Right upper lobe lung cancer adeno ca- Stage IV [contralateral left lower lobe lung nodule] status post chemoradiation-s/p Nivo currently on surveillance. Jan 2018- Restaging PET scan-stable right upper lobe mass/hilar mass; less than centimeter mediastinal adenopathy-STABLE. Clinically asymptomatic.   # cough likely bronchitis; s/p anti-biotics- recommend medrol dose pack.   # AAA- stable at 3.7 cm- follow up with Dr.Schneir. Stable.   # # I reviewed the blood work- with the patient/son in detail; also reviewed the imaging independently [as summarized above]; and with the patient in detail.   # 25 minutes face-to-face with the patient discussing the above plan of care; more than 50% of time spent on prognosis/ natural history; counseling and coordination.

## 2016-08-12 ENCOUNTER — Ambulatory Visit (INDEPENDENT_AMBULATORY_CARE_PROVIDER_SITE_OTHER): Payer: Medicare Other | Admitting: Internal Medicine

## 2016-08-12 ENCOUNTER — Encounter: Payer: Self-pay | Admitting: Internal Medicine

## 2016-08-12 VITALS — BP 124/60 | HR 60 | Temp 97.6°F | Resp 16 | Ht 63.0 in | Wt 162.8 lb

## 2016-08-12 DIAGNOSIS — R739 Hyperglycemia, unspecified: Secondary | ICD-10-CM

## 2016-08-12 DIAGNOSIS — C3490 Malignant neoplasm of unspecified part of unspecified bronchus or lung: Secondary | ICD-10-CM | POA: Diagnosis not present

## 2016-08-12 DIAGNOSIS — E7800 Pure hypercholesterolemia, unspecified: Secondary | ICD-10-CM | POA: Diagnosis not present

## 2016-08-12 DIAGNOSIS — K219 Gastro-esophageal reflux disease without esophagitis: Secondary | ICD-10-CM

## 2016-08-12 DIAGNOSIS — I10 Essential (primary) hypertension: Secondary | ICD-10-CM

## 2016-08-12 DIAGNOSIS — I714 Abdominal aortic aneurysm, without rupture, unspecified: Secondary | ICD-10-CM

## 2016-08-12 LAB — LIPID PANEL
CHOL/HDL RATIO: 4
Cholesterol: 184 mg/dL (ref 0–200)
HDL: 47.6 mg/dL (ref >39.00)
LDL Cholesterol: 112 mg/dL — ABNORMAL HIGH (ref 0–99)
NonHDL: 136.02
TRIGLYCERIDES: 122 mg/dL (ref 0.0–149.0)
VLDL: 24.4 mg/dL (ref 0.0–40.0)

## 2016-08-12 LAB — HEMOGLOBIN A1C: HEMOGLOBIN A1C: 6.8 % — AB (ref 4.6–6.5)

## 2016-08-12 MED ORDER — FLUTICASONE PROPIONATE 50 MCG/ACT NA SUSP: 2.0000 | Freq: Every day | NASAL | 3 refills | Status: DC

## 2016-08-12 NOTE — Progress Notes (Signed)
Pre-visit discussion using our clinic review tool. No additional management support is needed unless otherwise documented below in the visit note.  

## 2016-08-12 NOTE — Progress Notes (Signed)
Patient ID: Heather Cooper, female   DOB: 04/17/1931, 83 y.o.   MRN: [id number]   Subjective:    Patient ID: Heather Cooper, female    DOB: October 06, 1930, 83 y.o.   MRN: [id number]  HPI  Patient here for a scheduled follow up.  She is followed by Dr Rogue Bussing for stage IV lung cancer.  Recent PET scan - stable.  She feels she is doing relatively well.  No chest pain.  Breathing stable.  Eating and drinking.  No nausea or vomiting.  Bowels stable.  No dizziness.     Past Medical History:  Diagnosis Date  . Chicken pox   . Hypercalcemia    familial hypocalciuric hypercalcemia  . Hypercholesterolemia   . Hypertension   . Lung cancer ([place])   . Osteoporosis   . Thyroid disease    Past Surgical History:  Procedure Laterality Date  . ABDOMINAL HYSTERECTOMY     partial  . CHOLECYSTECTOMY    . transvaginal hysterectomy  04/18/05   with anterior colporrhaphy   Family History  Problem Relation Age of Onset  . Stroke Mother   . Hypertension Mother   . Prostate cancer Father   . Cancer Father     prostate  . Heart disease Brother     s/p CABG  . Colon cancer Neg Hx    Social History   Social History  . Marital status: Married    Spouse name: N/A  . Number of children: 3  . Years of education: N/A   Social History Main Topics  . Smoking status: Former Smoker    Quit date: 06/13/1997  . Smokeless tobacco: Never Used  . Alcohol use No  . Drug use: No  . Sexual activity: Not Asked   Other Topics Concern  . None   Social History Narrative  . None    Outpatient Encounter Prescriptions as of 08/12/2016  Medication Sig  . methylPREDNISolone (MEDROL DOSEPAK) 4 MG TBPK tablet Use as directed.  . nystatin cream (MYCOSTATIN) Apply 1 application topically 2 (two) times daily.  [name] olmesartan-hydrochlorothiazide (BENICAR HCT) 20-12.5 MG tablet Take 1 tablet by mouth daily.  . pantoprazole (PROTONIX) 40 MG tablet Take 1 tablet (40 mg total) by mouth daily.  . fluticasone (FLONASE)  50 MCG/ACT nasal spray Place 2 sprays into both nostrils daily.  . [DISCONTINUED] fluticasone (FLONASE) 50 MCG/ACT nasal spray Place 2 sprays into both nostrils daily.   Facility-Administered Encounter Medications as of 08/12/2016  Medication  . sodium chloride 0.9 % 1,000 mL with potassium chloride 20 mEq, magnesium sulfate 2 g infusion    Review of Systems  Constitutional: Negative for appetite change and unexpected weight change.  HENT: Negative for congestion and sinus pressure.   Respiratory: Negative for cough, chest tightness and shortness of breath.   Cardiovascular: Negative for chest pain, palpitations and leg swelling.  Gastrointestinal: Negative for abdominal pain, diarrhea, nausea and vomiting.  Genitourinary: Negative for difficulty urinating and dysuria.  Musculoskeletal: Negative for back pain and joint swelling.  Skin: Negative for color change and rash.  Neurological: Negative for dizziness, light-headedness and headaches.  Psychiatric/Behavioral: Negative for agitation and dysphoric mood.       Objective:     Blood pressure rechecked by me:  138/78  Physical Exam  Constitutional: She appears well-developed and well-nourished. No distress.  HENT:  Nose: Nose normal.  Mouth/Throat: Oropharynx is clear and moist.  Neck: Neck supple. No thyromegaly present.  Cardiovascular: Normal rate and regular rhythm.  Pulmonary/Chest: Breath sounds normal. No respiratory distress. She has no wheezes.  Abdominal: Soft. Bowel sounds are normal. There is no tenderness.  Musculoskeletal: She exhibits no edema or tenderness.  Lymphadenopathy:    She has no cervical adenopathy.  Skin: No rash noted. No erythema.  Psychiatric: She has a normal mood and affect. Her behavior is normal.    BP 124/60 (BP Location: Left Arm, Patient Position: Sitting, Cuff Size: Large)   Pulse 60   Temp 97.6 F (36.4 C) (Oral)   Resp 16   Ht _0  (1.6 m)   Wt 162 lb 12.8 oz (73.8 kg)   SpO2  97%   BMI 28.84 kg/m  Wt Readings from Last 3 Encounters:  08/12/16 162 lb 12.8 oz (73.8 kg)  06/16/16 158 lb (71.7 kg)  04/14/16 158 lb 12.8 oz (72 kg)     Lab Results  Component Value Date   WBC 7.8 06/16/2016   HGB 12.5 06/16/2016   HCT 36.6 06/16/2016   PLT 275 06/16/2016   GLUCOSE 145 (H) 06/16/2016   CHOL 184 08/12/2016   TRIG 122.0 08/12/2016   HDL 47.60 08/12/2016   LDLCALC 112 (H) 08/12/2016   ALT 17 06/16/2016   AST 24 06/16/2016   NA 136 06/16/2016   K 3.9 06/16/2016   CL 101 06/16/2016   CREATININE 0.86 06/16/2016   BUN 19 06/16/2016   CO2 26 06/16/2016   TSH 0.64 04/14/2016   INR 1.0 03/18/2014   INR 1.0 03/18/2014   HGBA1C 6.8 (H) 08/12/2016    Nm Pet Image Restag (ps) Skull Base To Thigh  Result Date: 06/14/2016 CLINICAL DATA:  Subsequent treatment strategy for stage IV right upper lobe lung cancer diagnosed October 2015 status post chemoradiation therapy, currently on surveillance presenting for restaging. EXAM: NUCLEAR MEDICINE PET SKULL BASE TO THIGH TECHNIQUE: 12.3 mCi F-18 FDG was injected intravenously. Full-ring PET imaging was performed from the skull base to thigh after the radiotracer. CT data was obtained and used for attenuation correction and anatomic localization. FASTING BLOOD GLUCOSE:  Value: 113 mg/dl COMPARISON:  02/05/2016 PET-CT. FINDINGS: NECK No hypermetabolic lymph nodes in the neck. Stable heterogeneous thyroid gland with no hypermetabolic thyroid nodules. CHEST Left main, left anterior descending, left circumflex and right coronary atherosclerosis. Stable trace pericardial effusion/ thickening. Atherosclerotic nonaneurysmal thoracic aorta. No hypermetabolic axillary lymph nodes. Mildly hypermetabolic 0.7 cm right paratracheal lymph node with max SUV 3.2 (series 3/image 79), previously 0.7 cm with max SUV 3.8, stable in size and slightly decreased in metabolism. Mildly hypermetabolic 0.6 cm subcarinal node with max SUV 3.4 (series 3/image  85), previously 0.7 cm with max SUV 3.3, not appreciably changed in size or metabolism. Mild focal hypermetabolism in the anterior right hilum with max SUV 3.4, previous max SUV 3.4, stable. No new hypermetabolic or enlarged mediastinal or hilar nodes. No pleural effusions. No pneumothorax. Stable sharply marginated consolidation, volume loss and distortion in the medial and parahilar right upper lobe without hypermetabolism (max SUV 2.8, which is below the mediastinal blood pool activity, and is stable since 02/05/2016), consistent with radiation fibrosis. Posterior right lower lobe 6 mm pulmonary nodule (series 3/image 87) is below PET resolution and not associated with significant metabolism, previously 7 mm, not appreciably changed. No acute consolidative airspace disease or new significant pulmonary nodules. ABDOMEN/PELVIS No abnormal hypermetabolic activity within the liver, pancreas, adrenal glands, or spleen. No hypermetabolic lymph nodes in the abdomen or pelvis. Small hiatal hernia. Diffuse hepatic steatosis. Cholecystectomy. Stable dilated common  bile duct (13 mm diameter) with no significant intrahepatic biliary ductal dilatation and with no radiopaque choledocholithiasis, most consistent with stable postcholecystectomy effect. Stable 1.0 cm left adrenal adenoma. Moderate distal colonic diverticulosis, most prominent at the junction of the descending and sigmoid colon. Hysterectomy. Stable 3.8 cm infrarenal abdominal aortic aneurysm. Aortic atherosclerosis. SKELETON No focal hypermetabolic activity to suggest skeletal metastasis. IMPRESSION: 1. 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 2. No definite hypermetabolic metastatic disease. 3. Nonspecific mildly hypermetabolic nonenlarged right paratracheal, subcarinal and right hilar lymph nodes demonstrate no growth or increased metabolism since 02/05/2016, suggesting reactive nodes in this  patient who has not received interval therapy. Continued PET-CT surveillance is advised. 4. Additional findings include aortic atherosclerosis, stable 3.8 cm infrarenal abdominal aortic aneurysm, coronary atherosclerosis, small hiatal hernia, diffuse hepatic steatosis, stable left adrenal adenoma and distal colonic diverticulosis. Electronically Signed   By: Ilona Sorrel M.D.   On: 06/14/2016 13:40       Assessment & Plan:   Problem List Items Addressed This Visit    Abdominal aortic aneurysm ([place])    Evaluated by Dr Ronalee Belts.  Stable.  Evaluated 03/31/16 - stable.  Recommended f/u in 12 months.        Carcinoma of lung ([place])    Followed by cancer center.  Just had PET scan as outlined.  Stable.       GERD (gastroesophageal reflux disease)    Controlled on protonix.        Hypercholesterolemia    Low cholesterol diet and exercise.  Follow lipid panel and liver function tests.        Relevant Orders   Lipid panel (Completed)   Hyperglycemia - Primary    Low carb diet and exercise.  Follow met b and a1c.        Relevant Orders   Hemoglobin A1c (Completed)   Hypertension    Blood pressure under good control.  Continue same medication regimen.  Follow pressures.  Follow metabolic panel.            [name], MD

## 2016-08-14 ENCOUNTER — Encounter: Payer: Self-pay | Admitting: Internal Medicine

## 2016-08-14 NOTE — Assessment & Plan Note (Signed)
Controlled on protonix.   

## 2016-08-14 NOTE — Assessment & Plan Note (Signed)
Evaluated by Dr Schneir.  Stable.  Evaluated 03/31/16 - stable.  Recommended f/u in 12 months.   

## 2016-08-14 NOTE — Assessment & Plan Note (Signed)
Followed by cancer center.  Just had PET scan as outlined.  Stable.

## 2016-08-14 NOTE — Assessment & Plan Note (Signed)
Blood pressure under good control.  Continue same medication regimen.  Follow pressures.  Follow metabolic panel.   

## 2016-08-14 NOTE — Assessment & Plan Note (Signed)
Low carb diet and exercise.  Follow met b and a1c.   

## 2016-08-14 NOTE — Assessment & Plan Note (Signed)
Low cholesterol diet and exercise.  Follow lipid panel and liver function tests.  

## 2016-08-15 ENCOUNTER — Telehealth: Payer: Self-pay

## 2016-08-15 NOTE — Telephone Encounter (Signed)
No answer no vm

## 2016-08-15 NOTE — Telephone Encounter (Signed)
-----   Message from [name], MD sent at 08/15/2016  3:42 AM EST ----- Notify pt that her cholesterol is relatively stable.  Overall sugar control increased some when compared to previous check.  Low carb diet.  We will follow.

## 2016-08-16 NOTE — Telephone Encounter (Signed)
No answer no vm

## 2016-08-17 NOTE — Telephone Encounter (Signed)
Pt informed will call if any questions

## 2016-09-16 ENCOUNTER — Encounter: Payer: Self-pay | Admitting: Family Medicine

## 2016-09-16 ENCOUNTER — Ambulatory Visit (INDEPENDENT_AMBULATORY_CARE_PROVIDER_SITE_OTHER): Payer: Medicare Other | Admitting: Family Medicine

## 2016-09-16 DIAGNOSIS — J069 Acute upper respiratory infection, unspecified: Secondary | ICD-10-CM | POA: Diagnosis not present

## 2016-09-16 MED ORDER — LEVOCETIRIZINE DIHYDROCHLORIDE 5 MG PO TABS: 5.0000 mg | ORAL_TABLET | Freq: Every evening | ORAL | 0 refills | Status: DC

## 2016-09-16 MED ORDER — PREDNISONE 50 MG PO TABS: ORAL_TABLET | ORAL | 0 refills | Status: DC

## 2016-09-16 MED ORDER — FLUTICASONE PROPIONATE 50 MCG/ACT NA SUSP: 2.0000 | Freq: Every day | NASAL | 0 refills | Status: DC

## 2016-09-16 MED ORDER — AZITHROMYCIN 250 MG PO TABS: ORAL_TABLET | ORAL | 0 refills | Status: DC

## 2016-09-16 NOTE — Progress Notes (Signed)
Subjective:  Patient ID: Heather Cooper, female    DOB: 10-17-1930  Age: 83 y.o. MRN: [id number]  CC: Cough, Runny nose, ST  HPI:  83 year old female presents with the above complaints.   Patient reports a 3 day history of cough, runny nose and ST. No associated fever, chills, SOB. She has been using Tylenol and delsym with little improve. Cough is mildly productive. No known exacerbating factors. No other complaints or concerns at this time.  Social Hx   Social History   Social History  . Marital status: Married    Spouse name: N/A  . Number of children: 3  . Years of education: N/A   Social History Main Topics  . Smoking status: Former Smoker    Quit date: 06/13/1997  . Smokeless tobacco: Never Used  . Alcohol use No  . Drug use: No  . Sexual activity: Not Asked   Other Topics Concern  . None   Social History Narrative  . None    Review of Systems  Constitutional: Negative.   HENT: Positive for rhinorrhea and sore throat.   Respiratory: Positive for cough.    Objective:  BP 134/62   Pulse (!) 102   Temp 98.4 F (36.9 C) (Oral)   Wt 160 lb (72.6 kg)   SpO2 98%   BMI 28.34 kg/m   BP/Weight 09/16/2016 08/15/3297 07/17/2681  Systolic BP 419 622 297  Diastolic BP 62 60 73  Wt. (Lbs) 160 162.8 158  BMI 28.34 28.84 27.99    Physical Exam  Constitutional: She appears well-developed. No distress.  HENT:  Mouth/Throat: Oropharynx is clear and moist.  Rhinorrhea.  Cardiovascular: Normal rate and regular rhythm.   Pulmonary/Chest: Effort normal and breath sounds normal.  Neurological: She is alert.  Psychiatric: She has a normal mood and affect.  Vitals reviewed.   Lab Results  Component Value Date   WBC 7.8 06/16/2016   HGB 12.5 06/16/2016   HCT 36.6 06/16/2016   PLT 275 06/16/2016   GLUCOSE 145 (H) 06/16/2016   CHOL 184 08/12/2016   TRIG 122.0 08/12/2016   HDL 47.60 08/12/2016   LDLCALC 112 (H) 08/12/2016   ALT 17 06/16/2016   AST 24 06/16/2016   NA 136 06/16/2016   K 3.9 06/16/2016   CL 101 06/16/2016   CREATININE 0.86 06/16/2016   BUN 19 06/16/2016   CO2 26 06/16/2016   TSH 0.64 04/14/2016   INR 1.0 03/18/2014   INR 1.0 03/18/2014   HGBA1C 6.8 (H) 08/12/2016    Assessment & Plan:   Problem List Items Addressed This Visit    URI (upper respiratory infection)    New problem. Likely viral. Patient adamant for antibiotic. Advised Prednisone, Flonase, and Xyzal.  Informed patient that I did not feel that an antibiotic was needed. I gave her prescription for azithromycin to be filled if she fails to improve or worsens over the weekend. I cautioned her to try not to fill it.      Relevant Medications   azithromycin (ZITHROMAX) 250 MG tablet      Meds ordered this encounter  Medications  . predniSONE (DELTASONE) 50 MG tablet    Sig: 1 tablet daily x 5 days.    Dispense:  5 tablet    Refill:  0  . levocetirizine (XYZAL) 5 MG tablet    Sig: Take 1 tablet (5 mg total) by mouth every evening.    Dispense:  30 tablet    Refill:  0  . fluticasone (FLONASE) 50 MCG/ACT nasal spray    Sig: Place 2 sprays into both nostrils daily.    Dispense:  16 g    Refill:  0  . azithromycin (ZITHROMAX) 250 MG tablet    Sig: 2 tablets on Day 1, then 1 tablet on days 2-5.    Dispense:  6 tablet    Refill:  0    Do not fill before 4/7.   Follow-up: PRN  [place]

## 2016-09-16 NOTE — Assessment & Plan Note (Signed)
New problem. Likely viral. Patient adamant for antibiotic. Advised Prednisone, Flonase, and Xyzal.  Informed patient that I did not feel that an antibiotic was needed. I gave her prescription for azithromycin to be filled if she fails to improve or worsens over the weekend. I cautioned her to try not to fill it.

## 2016-09-16 NOTE — Progress Notes (Signed)
Pre visit review using our clinic review tool, if applicable. No additional management support is needed unless otherwise documented below in the visit note. 

## 2016-09-16 NOTE — Patient Instructions (Signed)
Wait on the antibiotic.  Start the prednisone, Xyzal, and flonase.  Stop Claritin.  Take care  Dr. Lacinda Axon

## 2016-10-03 ENCOUNTER — Other Ambulatory Visit: Payer: Self-pay | Admitting: Internal Medicine

## 2016-10-04 ENCOUNTER — Telehealth: Payer: Self-pay | Admitting: Internal Medicine

## 2016-10-04 MED ORDER — NYSTATIN 100000 UNIT/GM EX CREA: 1.0000 "application " | TOPICAL_CREAM | Freq: Two times a day (BID) | CUTANEOUS | 0 refills | Status: DC

## 2016-10-04 NOTE — Telephone Encounter (Signed)
Refill sent in for nystatin cream.

## 2016-10-04 NOTE — Telephone Encounter (Signed)
Patient advised script sent in

## 2016-10-04 NOTE — Telephone Encounter (Signed)
Please advise if ok to refill. 

## 2016-10-04 NOTE — Telephone Encounter (Signed)
Pt is requesting to have her nystatin cream (MYCOSTATIN). Refilled. Please advise.  Thanks

## 2016-10-10 ENCOUNTER — Ambulatory Visit (INDEPENDENT_AMBULATORY_CARE_PROVIDER_SITE_OTHER): Payer: Medicare Other

## 2016-10-10 VITALS — BP 118/60 | HR 83 | Temp 97.8°F | Resp 12 | Ht 63.5 in | Wt 156.0 lb

## 2016-10-10 DIAGNOSIS — Z0000 Encounter for general adult medical examination without abnormal findings: Secondary | ICD-10-CM | POA: Diagnosis not present

## 2016-10-10 NOTE — Patient Instructions (Addendum)
  Ms. Cozine , Thank you for taking time to come for your Medicare Wellness Visit. I appreciate your ongoing commitment to your health goals. Please review the following plan we discussed and let me know if I can assist you in the future.   Follow up with Dr. Nicki Reaper as needed.    Bring a copy of your [place] and/or Living Will to be scanned into chart.  Have a great day!  These are the goals we discussed: Goals    . Healthy Lifestyle          Stay active Stay hydrated Low carb foods       This is a list of the screening recommended for you and due dates:  Health Maintenance  Topic Date Due  . Tetanus Vaccine  12[birth year]30/1949  . Pneumonia vaccines (2 of 2 - PCV13) 06/26/2013  . Mammogram  11/19/2014  . Flu Shot  01/11/2017  . DEXA scan (bone density measurement)  Completed

## 2016-10-10 NOTE — Progress Notes (Signed)
Care was provided under my supervision. I agree with the management as indicated in the note.  Jonnelle Lawniczak DO  

## 2016-10-10 NOTE — Progress Notes (Signed)
Subjective:   Heather Cooper is a 83 y.o. female who presents for an Initial Medicare Annual Wellness Visit.  Review of Systems    No ROS.  Medicare Wellness Visit.  Cardiac Risk Factors include: advanced age (>24mn, >>25women);hypertension     Objective:    Today's Vitals   10/10/16 1406  BP: 118/60  Pulse: 83  Resp: 12  Temp: 97.8 F (36.6 C)  TempSrc: Oral  SpO2: 98%  Weight: 156 lb (70.8 kg)  Height: 5' 3.5" (1.613 m)   Body mass index is 27.2 kg/m.   Current Medications (verified) Outpatient Encounter Prescriptions as of 10/10/2016  Medication Sig  . fluticasone (FLONASE) 50 MCG/ACT nasal spray Place 2 sprays into both nostrils daily.  .Marland Kitchenlevocetirizine (XYZAL) 5 MG tablet Take 1 tablet (5 mg total) by mouth every evening.  . nystatin cream (MYCOSTATIN) Apply 1 application topically 2 (two) times daily.  .Marland Kitchenolmesartan-hydrochlorothiazide (BENICAR HCT) 20-12.5 MG tablet Take 1 tablet by mouth daily.  . pantoprazole (PROTONIX) 40 MG tablet Take 1 tablet (40 mg total) by mouth daily.  . predniSONE (DELTASONE) 50 MG tablet 1 tablet daily x 5 days.  . [DISCONTINUED] azithromycin (ZITHROMAX) 250 MG tablet 2 tablets on Day 1, then 1 tablet on days 2-5.  . [DISCONTINUED] fluticasone (FLONASE) 50 MCG/ACT nasal spray Place 2 sprays into both nostrils daily.   Facility-Administered Encounter Medications as of 10/10/2016  Medication  . sodium chloride 0.9 % 1,000 mL with potassium chloride 20 mEq, magnesium sulfate 2 g infusion    Allergies (verified) Paclitaxel   History: Past Medical History:  Diagnosis Date  . Chicken pox   . Hypercalcemia    familial hypocalciuric hypercalcemia  . Hypercholesterolemia   . Hypertension   . Lung cancer ([place]   . Osteoporosis   . Thyroid disease    Past Surgical History:  Procedure Laterality Date  . ABDOMINAL HYSTERECTOMY     partial  . CHOLECYSTECTOMY    . transvaginal hysterectomy  04/18/05   with anterior colporrhaphy     Family History  Problem Relation Age of Onset  . Stroke Mother   . Hypertension Mother   . Prostate cancer Father   . Cancer Father     prostate  . Heart disease Brother     s/p CABG  . Colon cancer Neg Hx    Social History   Occupational History  . Not on file.   Social History Main Topics  . Smoking status: Former Smoker    Quit date: 06/13/1997  . Smokeless tobacco: Never Used  . Alcohol use No  . Drug use: No  . Sexual activity: Not on file    Tobacco Counseling Counseling given: Not Answered   Activities of Daily Living In your present state of health, do you have any difficulty performing the following activities: 10/10/2016  Hearing? N  Vision? N  Difficulty concentrating or making decisions? N  Walking or climbing stairs? N  Dressing or bathing? N  Doing errands, shopping? Y  Preparing Food and eating ? N  Using the Toilet? N  In the past six months, have you accidently leaked urine? N  Do you have problems with loss of bowel control? N  Managing your Medications? Y  Managing your Finances? Y  Housekeeping or managing your Housekeeping? Y  Some recent data might be hidden    Immunizations and Health Maintenance Immunization History  Administered Date(s) Administered  . Influenza Split 03/24/2014  . Influenza, High  Dose Seasonal PF 03/21/2016  . Influenza,inj,Quad PF,36+ Mos 03/02/2015  . Influenza-Unspecified 03/14/2012  . Pneumococcal Polysaccharide-23 06/26/2012   Health Maintenance Due  Topic Date Due  . TETANUS/TDAP  12/30/1949  . PNA vac Low Risk Adult (2 of 2 - PCV13) 06/26/2013  . MAMMOGRAM  11/19/2014    Patient Care Team: Heather Pheasant, MD as PCP - General (Internal Medicine)  Indicate any recent Medical Services you may have received from other than [facility] providers in the past year (date may be approximate).     Assessment:   This is a routine wellness examination for [place]. The goal of the wellness visit is to assist the  patient how to close the gaps in care and create a preventative care plan for the patient.   Taking calcium VIT D3 as appropriate/Osteoporosis reviewed.  Medications reviewed; taking without issues or barriers.  Safety issues reviewed; son lives in the home.  Smoke and carbon monoxide detectors in the home. No firearms in the home.  Wears seatbelts when riding with others. Patient does wear sunscreen or protective clothing when in direct sunlight. No violence in the home.  Depression- PHQ 2 &9 complete.  No signs/symptoms or verbal communication regarding little pleasure in doing things, feeling down, depressed or hopeless. No changes in sleeping, energy, eating, concentrating.  No thoughts of self-harm or harm towards others.  Time spent on this topic is 8 minutes.   Patient is alert, normal appearance, oriented to person/place/and time. Correctly identified the president of the [place], recall of 3/3 words, and performing simple calculations.  Patient displays appropriate judgement and can read correct time from watch face.  No new identified risk were noted.  No failures at ADL's or IADL's. Cane or walker in use when ambulating.  Daughter assists with medication   BMI- discussed the importance of a healthy diet, water intake and exercise. Educational material provided.   Diet:  Baked/grilled food.  HTN- followed by PCP.  Dental-  Dentures.  Eye- Visual acuity not assessed per patient preference since they have regular follow up with the ophthalmologist.    Sleep patterns- Sleeps 7-8 hours at night.  Wakes feeling rested.  Prevnar 13 and TDAP vaccine deferred per patient preference.    Educational material provided.  Mammogram deferred per patient request due to recent PET (skull base to thigh).  Patient Concerns: None at this time. Follow up with PCP as needed.  Hearing/Vision screen Hearing Screening Comments: Patient is able to hear conversational tones without difficulty.   No issues reported.  Vision Screening Comments: Followed by Dr. Ellin Cooper Next scheduled appointment 10/19/16 Last OV 2017 Cataract extraction, bilateral Visual acuity not assessed per patient preference since they have regular follow up with the ophthalmologist  Dietary issues and exercise activities discussed: Current Exercise Habits: Home exercise routine, Type of exercise: walking (Leg strengthening chair exercises.), Time (Minutes): 15, Frequency (Times/Week): 4, Weekly Exercise (Minutes/Week): 60, Intensity: Mild  Goals    . Healthy Lifestyle          Stay active Stay hydrated Low carb foods      Depression Screen PHQ 2/9 Scores 10/10/2016 04/14/2016 10/11/2013 10/07/2012 07/01/2012 06/26/2012  PHQ - 2 Score 0 0 0 0 0 0  PHQ- 9 Score 0 - - - - -    Fall Risk Fall Risk  10/10/2016 04/14/2016 10/11/2013 10/07/2012 07/01/2012  Falls in the past year? No No No No No    Cognitive Function:     6CIT Screen 10/10/2016  What Year? 0 points  What month? 0 points  What time? 0 points  Count back from 20 0 points  Months in reverse 0 points  Repeat phrase 0 points  Total Score 0    Screening Tests Health Maintenance  Topic Date Due  . TETANUS/TDAP  12/30/1951  . PNA vac Low Risk Adult (2 of 2 - PCV13) 06/26/2013  . MAMMOGRAM  11/19/2014  . INFLUENZA VACCINE  01/11/2017  . DEXA SCAN  Completed      Plan:    End of life planning; Advance aging; Advanced directives discussed. Copy of current HCPOA/Living Will requested.    I have personally reviewed and noted the following in the patient's chart:   . Medical and social history . Use of alcohol, tobacco or illicit drugs  . Current medications and supplements . Functional ability and status . Nutritional status . Physical activity . Advanced directives . List of other physicians . Hospitalizations, surgeries, and ER visits in previous 12 months . Vitals . Screenings to include cognitive, depression, and falls . Referrals  and appointments  In addition, I have reviewed and discussed with patient certain preventive protocols, quality metrics, and best practice recommendations. A written personalized care plan for preventive services as well as general preventive health recommendations were provided to patient.     Varney Biles, LPN   03/07/4627

## 2016-12-21 ENCOUNTER — Ambulatory Visit: Payer: Medicare Other | Admitting: Internal Medicine

## 2016-12-21 ENCOUNTER — Other Ambulatory Visit: Payer: Medicare Other

## 2016-12-27 ENCOUNTER — Encounter
Admission: RE | Admit: 2016-12-27 | Discharge: 2016-12-27 | Disposition: A | Discharge status: Home / Self Care | Payer: Medicare Other | Source: Ambulatory Visit | Attending: Internal Medicine | Admitting: Internal Medicine

## 2016-12-27 DIAGNOSIS — C3411 Malignant neoplasm of upper lobe, right bronchus or lung: Secondary | ICD-10-CM | POA: Diagnosis present

## 2016-12-27 LAB — GLUCOSE, CAPILLARY: GLUCOSE-CAPILLARY: 101 mg/dL — AB (ref 65–99)

## 2016-12-27 MED ORDER — FLUDEOXYGLUCOSE F - 18 (FDG) INJECTION
12.0000 | Freq: Once | INTRAVENOUS | Status: AC | PRN
  Administered 2016-12-27: 12.55 via INTRAVENOUS

## 2016-12-28 ENCOUNTER — Inpatient Hospital Stay: Payer: Medicare Other | Attending: Internal Medicine | Admitting: Internal Medicine

## 2016-12-28 ENCOUNTER — Inpatient Hospital Stay: Payer: Medicare Other

## 2016-12-28 VITALS — BP 124/72 | HR 73 | Temp 97.2°F | Resp 16 | Wt 156.0 lb

## 2016-12-28 DIAGNOSIS — C3411 Malignant neoplasm of upper lobe, right bronchus or lung: Secondary | ICD-10-CM | POA: Diagnosis present

## 2016-12-28 DIAGNOSIS — I714 Abdominal aortic aneurysm, without rupture: Secondary | ICD-10-CM | POA: Insufficient documentation

## 2016-12-28 DIAGNOSIS — Z79899 Other long term (current) drug therapy: Secondary | ICD-10-CM | POA: Diagnosis not present

## 2016-12-28 DIAGNOSIS — E079 Disorder of thyroid, unspecified: Secondary | ICD-10-CM | POA: Diagnosis not present

## 2016-12-28 DIAGNOSIS — Z923 Personal history of irradiation: Secondary | ICD-10-CM | POA: Diagnosis not present

## 2016-12-28 DIAGNOSIS — E7800 Pure hypercholesterolemia, unspecified: Secondary | ICD-10-CM | POA: Insufficient documentation

## 2016-12-28 DIAGNOSIS — M810 Age-related osteoporosis without current pathological fracture: Secondary | ICD-10-CM | POA: Diagnosis not present

## 2016-12-28 DIAGNOSIS — Z7952 Long term (current) use of systemic steroids: Secondary | ICD-10-CM | POA: Diagnosis not present

## 2016-12-28 DIAGNOSIS — Z9049 Acquired absence of other specified parts of digestive tract: Secondary | ICD-10-CM | POA: Insufficient documentation

## 2016-12-28 DIAGNOSIS — Z9221 Personal history of antineoplastic chemotherapy: Secondary | ICD-10-CM | POA: Diagnosis not present

## 2016-12-28 DIAGNOSIS — K5730 Diverticulosis of large intestine without perforation or abscess without bleeding: Secondary | ICD-10-CM | POA: Diagnosis not present

## 2016-12-28 DIAGNOSIS — Z8042 Family history of malignant neoplasm of prostate: Secondary | ICD-10-CM | POA: Diagnosis not present

## 2016-12-28 DIAGNOSIS — C7801 Secondary malignant neoplasm of right lung: Secondary | ICD-10-CM | POA: Diagnosis not present

## 2016-12-28 DIAGNOSIS — Y842 Radiological procedure and radiotherapy as the cause of abnormal reaction of the patient, or of later complication, without mention of misadventure at the time of the procedure: Secondary | ICD-10-CM | POA: Diagnosis not present

## 2016-12-28 DIAGNOSIS — K760 Fatty (change of) liver, not elsewhere classified: Secondary | ICD-10-CM | POA: Diagnosis not present

## 2016-12-28 DIAGNOSIS — Z87891 Personal history of nicotine dependence: Secondary | ICD-10-CM | POA: Insufficient documentation

## 2016-12-28 DIAGNOSIS — Z8679 Personal history of other diseases of the circulatory system: Secondary | ICD-10-CM | POA: Insufficient documentation

## 2016-12-28 DIAGNOSIS — I10 Essential (primary) hypertension: Secondary | ICD-10-CM | POA: Diagnosis not present

## 2016-12-28 DIAGNOSIS — K449 Diaphragmatic hernia without obstruction or gangrene: Secondary | ICD-10-CM | POA: Insufficient documentation

## 2016-12-28 DIAGNOSIS — D3502 Benign neoplasm of left adrenal gland: Secondary | ICD-10-CM | POA: Insufficient documentation

## 2016-12-28 DIAGNOSIS — I700 Atherosclerosis of aorta: Secondary | ICD-10-CM | POA: Diagnosis not present

## 2016-12-28 DIAGNOSIS — Z1211 Encounter for screening for malignant neoplasm of colon: Secondary | ICD-10-CM

## 2016-12-28 LAB — COMPREHENSIVE METABOLIC PANEL
ALK PHOS: 79 U/L (ref 38–126)
ALT: 16 U/L (ref 14–54)
ANION GAP: 7 (ref 5–15)
AST: 20 U/L (ref 15–41)
Albumin: 4 g/dL (ref 3.5–5.0)
BILIRUBIN TOTAL: 0.5 mg/dL (ref 0.3–1.2)
BUN: 22 mg/dL — ABNORMAL HIGH (ref 6–20)
CALCIUM: 9.8 mg/dL (ref 8.9–10.3)
CO2: 27 mmol/L (ref 22–32)
CREATININE: 0.96 mg/dL (ref 0.44–1.00)
Chloride: 100 mmol/L — ABNORMAL LOW (ref 101–111)
GFR calc non Af Amer: 53 mL/min — ABNORMAL LOW (ref >60)
GLUCOSE: 140 mg/dL — AB (ref 65–99)
Potassium: 3.7 mmol/L (ref 3.5–5.1)
Sodium: 134 mmol/L — ABNORMAL LOW (ref 135–145)
TOTAL PROTEIN: 7.6 g/dL (ref 6.5–8.1)

## 2016-12-28 LAB — CBC WITH DIFFERENTIAL/PLATELET
BASOS ABS: 0.1 10*3/uL (ref 0–0.1)
BASOS PCT: 1 %
EOS ABS: 0.2 10*3/uL (ref 0–0.7)
Eosinophils Relative: 2 %
HEMATOCRIT: 36 % (ref 35.0–47.0)
Hemoglobin: 12.2 g/dL (ref 12.0–16.0)
Lymphocytes Relative: 25 %
Lymphs Abs: 2 10*3/uL (ref 1.0–3.6)
MCH: 28.1 pg (ref 26.0–34.0)
MCHC: 33.9 g/dL (ref 32.0–36.0)
MCV: 82.8 fL (ref 80.0–100.0)
MONO ABS: 0.5 10*3/uL (ref 0.2–0.9)
Monocytes Relative: 6 %
NEUTROS ABS: 5.1 10*3/uL (ref 1.4–6.5)
Neutrophils Relative %: 66 %
PLATELETS: 289 10*3/uL (ref 150–440)
RBC: 4.34 MIL/uL (ref 3.80–5.20)
RDW: 14.5 % (ref 11.5–14.5)
WBC: 7.8 10*3/uL (ref 3.6–11.0)

## 2016-12-28 NOTE — Progress Notes (Signed)
Patient is here today for a follow up. Patient states no new concerns today.  

## 2016-12-28 NOTE — Progress Notes (Signed)
[place] OFFICE PROGRESS NOTE  Patient Care Team: [name], MD as PCP - General (Internal Medicine)  Cancer Staging No matching staging information was found for the patient.   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  Starting radiation and chemotherapy from April 14, 2014 Patient was started on carboplatinum andTaxol Herve were developed an allergic reaction to Taxol so would be changed to Abraxane 3.patient has finished 6 cycles of weekly chemotherapy with carboplatinum  and radiation therapy(May 28, 2014) 4.started on  NIVOLULAMAB because of persistent disease July 02, 2014. 5.  NIVOLULAMAB was discontinued because of persistent diarrhea in July of 2016.  August of 2016 CT scan was stable so no further chemotherapy  # AUG 25th PET- STABLE RUL MASS [radiation fibrosis; <1cm ? Mediastinal recurrence]; repeat PET in 4 m  # AAA/ 3.3x 3.9 Stable [Dr.Schnier]     Cancer of upper lobe of right lung ([place])     INTERVAL HISTORY:  Heather Cooper 83 y.o.  female pleasant patient above history of Right upper lobe adenoca cell Stage IV lung cancer [contralateral lower lobe lung nodule]- currently on surveillance is here to review the results of her restaging PET scan.  She is accompanied by her daughter/husband.   Patient denies any nausea vomiting. Denies any unusual shortness of breath. Not losing any weight. No headaches. No tingling and numbness. Appetite is good.She denies any blood in stools or black stools. She continues very active.   REVIEW OF SYSTEMS:  A complete 10 point review of system is  done which is negative except mentioned above/history of present illness.   PAST MEDICAL HISTORY :  Past Medical History:  Diagnosis Date  . Chicken pox   . Hypercalcemia    familial hypocalciuric hypercalcemia  . Hypercholesterolemia   . Hypertension   . Lung cancer ([place])   . Osteoporosis   . Thyroid disease     PAST SURGICAL HISTORY :   Past Surgical History:  Procedure Laterality Date  . ABDOMINAL HYSTERECTOMY     partial  . CHOLECYSTECTOMY    . transvaginal hysterectomy  04/18/05   with anterior colporrhaphy    FAMILY HISTORY :   Family History  Problem Relation Age of Onset  . Stroke Mother   . Hypertension Mother   . Prostate cancer Father   . Cancer Father        prostate  . Heart disease Brother        s/p CABG  . Colon cancer Neg Hx     SOCIAL HISTORY:   Social History  Substance Use Topics  . Smoking status: Former Smoker    Quit date: 06/13/1997  . Smokeless tobacco: Never Used  . Alcohol use No    ALLERGIES:  is allergic to paclitaxel.  MEDICATIONS:  Current Outpatient Prescriptions  Medication Sig Dispense Refill  . fluticasone (FLONASE) 50 MCG/ACT nasal spray Place 2 sprays into both nostrils daily. 16 g 0  . levocetirizine (XYZAL) 5 MG tablet Take 1 tablet (5 mg total) by mouth every evening. 30 tablet 0  . nystatin cream (MYCOSTATIN) Apply 1 application topically 2 (two)  times daily. 30 g 0  . olmesartan-hydrochlorothiazide (BENICAR HCT) 20-12.5 MG tablet Take 1 tablet by mouth daily. 90 tablet 1  . pantoprazole (PROTONIX) 40 MG tablet Take 1 tablet (40 mg total) by mouth daily. 90 tablet 1   No current facility-administered medications for this visit.    Facility-Administered Medications Ordered in Other Visits  Medication Dose Route Frequency Provider Last Rate Last Dose  . sodium chloride 0.9 % 1,000 mL with potassium chloride 20 mEq, magnesium sulfate 2 g infusion   Intravenous Continuous Forest Gleason, MD   Stopped at 12/04/14 1300     PHYSICAL EXAMINATION: ECOG PERFORMANCE STATUS: 0 - Asymptomatic  BP 124/72 (BP Location: Left Arm)   Pulse 73   Temp (!) 97.2 F (36.2 C) (Tympanic)   Resp 16   Wt 156 lb (70.8 kg)   BMI 27.20 kg/m   Filed Weights   12/28/16 1024  Weight: 156 lb (70.8 kg)    GENERAL: Well-nourished well-developed; Alert, no distress and comfortable.   With son/ family.  EYES: no pallor or icterus OROPHARYNX: no thrush or ulceration; good dentition  NECK: supple, no masses felt LYMPH:  no palpable lymphadenopathy in the cervical, axillary or inguinal regions LUNGS: clear to auscultation and  No wheeze or crackles HEART/CVS: regular rate & rhythm and no murmurs; No lower extremity edema ABDOMEN:abdomen soft, non-tender and normal bowel sounds Musculoskeletal:no cyanosis of digits and no clubbing  PSYCH: alert & oriented x 3 with fluent speech NEURO: no focal motor/sensory deficits SKIN:  no rashes or significant lesions  LABORATORY DATA:  I have reviewed the data as listed    Component Value Date/Time   NA 134 (L) 12/28/2016 0958   NA 130 (L) 10/09/2014 0846   K 3.7 12/28/2016 0958   K 4.1 10/09/2014 0846   CL 100 (L) 12/28/2016 0958   CL 98 (L) 10/09/2014 0846   CO2 27 12/28/2016 0958   CO2 25 10/09/2014 0846   GLUCOSE 140 (H) 12/28/2016 0958   GLUCOSE 161 (H) 10/09/2014 0846   BUN 22 (H) 12/28/2016 0958   BUN 14 10/09/2014 0846   CREATININE 0.96 12/28/2016 0958   CREATININE 0.70 10/09/2014 0846   CALCIUM 9.8 12/28/2016 0958   CALCIUM 9.1 10/09/2014 0846   PROT 7.6 12/28/2016 0958   PROT 7.3 10/09/2014 0846   ALBUMIN 4.0 12/28/2016 0958   ALBUMIN 3.6 10/09/2014 0846   AST 20 12/28/2016 0958   AST 16 10/09/2014 0846   ALT 16 12/28/2016 0958   ALT 13 (L) 10/09/2014 0846   ALKPHOS 79 12/28/2016 0958   ALKPHOS 70 10/09/2014 0846   BILITOT 0.5 12/28/2016 0958   BILITOT 1.0 10/09/2014 0846   GFRNONAA 53 (L) 12/28/2016 0958   GFRNONAA >60 10/09/2014 0846   GFRAA >60  12/28/2016 0958   GFRAA >60 10/09/2014 0846    No results found for: SPEP, UPEP  Lab Results  Component Value Date   WBC 7.8 12/28/2016   NEUTROABS 5.1 12/28/2016   HGB 12.2 12/28/2016   HCT 36.0 12/28/2016   MCV 82.8 12/28/2016   PLT 289 12/28/2016      Chemistry      Component Value Date/Time   NA 134 (L) 12/28/2016 0958   NA 130 (L) 10/09/2014 0846   K 3.7 12/28/2016 0958   K 4.1 10/09/2014 0846   CL 100 (L) 12/28/2016 0958   CL 98 (L) 10/09/2014 0846   CO2 27 12/28/2016 0958   CO2 25 10/09/2014 0846  BUN 22 (H) 12/28/2016 0958   BUN 14 10/09/2014 0846   CREATININE 0.96 12/28/2016 0958   CREATININE 0.70 10/09/2014 0846   GLU 111 03/18/2014 1048      Component Value Date/Time   CALCIUM 9.8 12/28/2016 0958   CALCIUM 9.1 10/09/2014 0846   ALKPHOS 79 12/28/2016 0958   ALKPHOS 70 10/09/2014 0846   AST 20 12/28/2016 0958   AST 16 10/09/2014 0846   ALT 16 12/28/2016 0958   ALT 13 (L) 10/09/2014 0846   BILITOT 0.5 12/28/2016 0958   BILITOT 1.0 10/09/2014 0846       IMPRESSION: 1. 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 2. No definite hypermetabolic metastatic disease. 3. Nonspecific mildly hypermetabolic nonenlarged right paratracheal, subcarinal and right hilar lymph nodes demonstrate no growth or increased metabolism since 02/05/2016, suggesting reactive nodes in this patient who has not received interval therapy. Continued PET-CT surveillance is advised. 4. Additional findings include aortic atherosclerosis, stable 3.8 cm infrarenal abdominal aortic aneurysm, coronary atherosclerosis, small hiatal hernia, diffuse hepatic steatosis, stable left adrenal adenoma and distal colonic diverticulosis.   Electronically Signed   By: Ilona Sorrel M.D.   On: 06/14/2016 13:40  RADIOGRAPHIC STUDIES: I have personally reviewed the radiological images as listed and agreed with the findings in the  report. Nm Pet Image Restag (ps) Skull Base To Thigh  Result Date: 12/27/2016 CLINICAL DATA:  Subsequent treatment strategy for right-sided lung cancer. EXAM: NUCLEAR MEDICINE PET SKULL BASE TO THIGH TECHNIQUE: 12.55 mCi F-18 FDG was injected intravenously. Full-ring PET imaging was performed from the skull base to thigh after the radiotracer. CT data was obtained and used for attenuation correction and anatomic localization. FASTING BLOOD GLUCOSE:  Value: 101 mg/dl COMPARISON:  PET-CT 06/14/2016. FINDINGS: NECK No hypermetabolic lymph nodes in the neck. CHEST No hypermetabolic mediastinal or hilar nodes. No suspicious pulmonary nodules on the CT scan. There is again some mass-like architectural distortion in the paramediastinal aspect of the right upper lobe which is similar in appearance to the prior examination and has a central nodular appearing area that measures approximately 1.7 cm in diameter (axial image 83 of series 3) which is stable in size compared to the prior study, and demonstrates only low-level metabolic activity (SUVmax = 2.5 (previously 2.8)), presumably a benign area of postradiation mass-like fibrosis. No acute consolidative airspace disease. No pleural effusions. Mild scarring in the left lung base. There is aortic atherosclerosis, as well as atherosclerosis of the great vessels of the mediastinum and the coronary arteries, including calcified atherosclerotic plaque in the left main, left anterior descending, left circumflex and right coronary arteries. Calcifications of the aortic valve (mild). ABDOMEN/PELVIS No abnormal hypermetabolic activity within the liver, pancreas, adrenal glands, or spleen. No hypermetabolic lymph nodes in the abdomen or pelvis. Status post cholecystectomy. Aortic atherosclerosis with aneurysmal dilatation of the infrarenal abdominal aorta which measures up to 4.1 x 3.6 cm. Colonic diverticulosis without evidence of acute diverticulitis at this time. Notably, in  the proximal sigmoid colon in the midst of an area of extensive diverticular disease there is some focal mural thickening best appreciated on axial image 220 of series 3 which demonstrates hypermetabolism (SUVmax = 10.0). Status post hysterectomy. No significant volume of ascites. No pneumoperitoneum. No pathologic distention of small bowel. SKELETON No focal hypermetabolic activity to suggest skeletal metastasis. IMPRESSION: 1. Chronic post radiation changes in the paramediastinal aspect of the right upper lobe are most compatible with postradiation mass-like fibrosis.  No definite findings to suggest local recurrence of disease or metastatic disease in the neck, chest, abdomen or pelvis. 2. Colonic diverticulosis. Although there are no findings to suggest an acute diverticulitis at this time, there is an area of masslike thickening of the proximal sigmoid colon which demonstrates hypermetabolism (SUVmax = 10.0). Although this may simply represent chronic inflammatory changes related to underlying diverticular disease, the possibility of colonic neoplasm should be considered. If there has not been a recent colonoscopy performed, further evaluation with nonemergent colonoscopy is suggested in the near future to exclude colonic neoplasm. 3. Aortic atherosclerosis, in addition to left main and 3 vessel coronary artery disease. In addition, there is an infrarenal abdominal aortic aneurysm which measures up to 4.1 x 3.6 cm. Recommend followup by ultrasound in 1 year. This recommendation follows ACR consensus guidelines: White Paper of the ACR Incidental Findings Committee II on Vascular Findings. J Am Coll Radiol 2013; 10:789-794. 4. Additional incidental findings, as above. Aortic Atherosclerosis (ICD10-I70.0). Electronically Signed   By: Vinnie Langton M.D.   On: 12/27/2016 14:57    IMPRESSION: 1. Chronic post radiation changes in the paramediastinal aspect of the right upper lobe are most compatible with  postradiation mass-like fibrosis. No definite findings to suggest local recurrence of disease or metastatic disease in the neck, chest, abdomen or pelvis. 2. Colonic diverticulosis. Although there are no findings to suggest an acute diverticulitis at this time, there is an area of masslike thickening of the proximal sigmoid colon which demonstrates hypermetabolism (SUVmax = 10.0). Although this may simply represent chronic inflammatory changes related to underlying diverticular disease, the possibility of colonic neoplasm should be considered. If there has not been a recent colonoscopy performed, further evaluation with nonemergent colonoscopy is suggested in the near future to exclude colonic neoplasm. 3. Aortic atherosclerosis, in addition to left main and 3 vessel coronary artery disease. In addition, there is an infrarenal abdominal aortic aneurysm which measures up to 4.1 x 3.6 cm. Recommend followup by ultrasound in 1 year. This recommendation follows ACR consensus guidelines: White Paper of the ACR Incidental Findings Committee II on Vascular Findings. J Am Coll Radiol 2013; 10:789-794. 4. Additional incidental findings, as above.  Aortic Atherosclerosis (ICD10-I70.0).   Electronically Signed   By: Vinnie Langton M.D.   On: 12/27/2016 14:57 ASSESSMENT & PLAN:  Cancer of upper lobe of right lung ([facility]) Right upper lobe lung cancer adeno ca- Stage IV [contralateral left lower lobe lung nodule] status post chemoradiation-s/p Nivo currently on surveillance. July 16th 2018- Restaging PET scan-stable right upper lobe mass/hilar mass; less than centimeter mediastinal adenopathy-STABLE; C discussion below regarding colonic uptake/AAA.   #Clinically asymptomatic. No evidence of recurrence/progression. Recommend surveillance.  # PET scan shows incidental sigmoid colon uptake. No Symptoms. No anemia. Recommend colo/refer to GI. Dr.Elliot.   # AAA- stable at 3.7 cm; increased by  few mm; follows up with Dr.Schneir recommend follow up.   # I reviewed the blood work- with the patient/daughter in detail; also reviewed the imaging independently [as summarized above]; and with the patient in detail.   # 25 minutes face-to-face with the patient discussing the above plan of care; more than 50% of time spent on prognosis/ natural history; counseling and coordination.   Orders Placed This Encounter  Procedures  . NM PET Image Restag (PS) Skull Base To Thigh    Standing Status:   Future    Standing Expiration Date:   02/27/2018    Order Specific Question:   Reason for  Exam (SYMPTOM  OR DIAGNOSIS REQUIRED)    Answer:   lung cancer    Order Specific Question:   Preferred imaging location?    Answer:    [facility]  . CBC with Differential    Standing Status:   Future    Standing Expiration Date:   12/28/2017  . Basic metabolic panel    Standing Status:   Future    Standing Expiration Date:   12/28/2017  . Ambulatory referral to Gastroenterology    Referral Priority:   Routine    Referral Type:   Consultation    Referral Reason:   Specialty Services Required    Referred to Provider:   Manya Silvas, MD    Requested Specialty:   Gastroenterology    Number of Visits Requested:   1   All questions were answered. The patient knows to call the clinic with any problems, questions or concerns.      Cammie Sickle, MD 12/28/2016 4:43 PM

## 2016-12-28 NOTE — Assessment & Plan Note (Addendum)
Right upper lobe lung cancer adeno ca- Stage IV [contralateral left lower lobe lung nodule] status post chemoradiation-s/p Nivo currently on surveillance. July 16th 2018- Restaging PET scan-stable right upper lobe mass/hilar mass; less than centimeter mediastinal adenopathy-STABLE; C discussion below regarding colonic uptake/AAA.   #Clinically asymptomatic. No evidence of recurrence/progression. Recommend surveillance.  # PET scan shows incidental sigmoid colon uptake. No Symptoms. No anemia. Recommend colo/refer to GI. Dr.Elliot.   # AAA- stable at 3.7 cm; increased by few mm; follows up with Dr.Schneir recommend follow up.   # I reviewed the blood work- with the patient/daughter in detail; also reviewed the imaging independently [as summarized above]; and with the patient in detail.   # 25 minutes face-to-face with the patient discussing the above plan of care; more than 50% of time spent on prognosis/ natural history; counseling and coordination.

## 2017-01-12 ENCOUNTER — Ambulatory Visit (INDEPENDENT_AMBULATORY_CARE_PROVIDER_SITE_OTHER): Payer: Medicare Other | Admitting: Vascular Surgery

## 2017-01-12 ENCOUNTER — Encounter (INDEPENDENT_AMBULATORY_CARE_PROVIDER_SITE_OTHER): Payer: Self-pay | Admitting: Vascular Surgery

## 2017-01-12 VITALS — BP 137/77 | HR 79 | Resp 16 | Ht 64.0 in | Wt 154.0 lb

## 2017-01-12 DIAGNOSIS — I714 Abdominal aortic aneurysm, without rupture, unspecified: Secondary | ICD-10-CM

## 2017-01-12 DIAGNOSIS — C3411 Malignant neoplasm of upper lobe, right bronchus or lung: Secondary | ICD-10-CM | POA: Diagnosis not present

## 2017-01-12 DIAGNOSIS — K219 Gastro-esophageal reflux disease without esophagitis: Secondary | ICD-10-CM

## 2017-01-12 DIAGNOSIS — I10 Essential (primary) hypertension: Secondary | ICD-10-CM | POA: Diagnosis not present

## 2017-01-12 NOTE — Progress Notes (Signed)
MRN : [id number]  Heather Cooper is a 83 y.o. (10-23-1930) female who presents with chief complaint of No chief complaint on file. [name]  History of Present Illness: The patient returns to the office for surveillance of a known abdominal aortic aneurysm. Patient denies abdominal pain or back pain, no other abdominal complaints. No changes suggesting embolic episodes.   There have been no interval changes in the patient's overall health care since his last visit. Recently she had a PET scan to monitor her lung cancer which showed her aneurysm was now 4.1 cm. Because this was increased in size compared to her last study she was asked to return to see me sooner than expected  Patient denies amaurosis fugax or TIA symptoms. There is no history of claudication or rest pain symptoms of the lower extremities. The patient denies angina or shortness of breath.     No outpatient prescriptions have been marked as taking for the 01/12/17 encounter (Appointment) with Delana Meyer, Dolores Lory, MD.    Past Medical History:  Diagnosis Date  . Chicken pox   . Hypercalcemia    familial hypocalciuric hypercalcemia  . Hypercholesterolemia   . Hypertension   . Lung cancer ([place])   . Osteoporosis   . Thyroid disease     Past Surgical History:  Procedure Laterality Date  . ABDOMINAL HYSTERECTOMY     partial  . CHOLECYSTECTOMY    . transvaginal hysterectomy  04/18/05   with anterior colporrhaphy    Social History Social History  Substance Use Topics  . Smoking status: Former Smoker    Quit date: 06/13/1997  . Smokeless tobacco: Never Used  . Alcohol use No    Family History Family History  Problem Relation Age of Onset  . Stroke Mother   . Hypertension Mother   . Prostate cancer Father   . Cancer Father        prostate  . Heart disease Brother        s/p CABG  . Colon cancer Neg Hx     Allergies  Allergen Reactions  . Paclitaxel Other (See Comments)    Chest tightness     REVIEW OF  SYSTEMS (Negative unless checked)  Constitutional: [] Weight loss  [] Fever  [] Chills Cardiac: [] Chest pain   [] Chest pressure   [] Palpitations   [] Shortness of breath when laying flat   [x] Shortness of breath with exertion. Vascular:  [] Pain in legs with walking   [] Pain in legs at rest  [] History of DVT   [] Phlebitis   [] Swelling in legs   [] Varicose veins   [] Non-healing ulcers Pulmonary:   [] Uses home oxygen   [] Productive cough   [] Hemoptysis   [] Wheeze  [] COPD   [] Asthma Neurologic:  [] Dizziness   [] Seizures   [] History of stroke   [] History of TIA  [] Aphasia   [] Vissual changes   [] Weakness or numbness in arm   [] Weakness or numbness in leg Musculoskeletal:   [] Joint swelling   [] Joint pain   [] Low back pain Hematologic:  [] Easy bruising  [] Easy bleeding   [] Hypercoagulable state   [] Anemic Gastrointestinal:  [] Diarrhea   [] Vomiting  [] Gastroesophageal reflux/heartburn   [] Difficulty swallowing. Genitourinary:  [] Chronic kidney disease   [] Difficult urination  [] Frequent urination   [] Blood in urine Skin:  [] Rashes   [] Ulcers  Psychological:  [] History of anxiety   []  History of major depression.  Physical Examination  There were no vitals filed for this visit. There is no height or weight on file  to calculate BMI. Gen: WD/WN, NAD Head: [place]/AT, No temporalis wasting.  Ear/Nose/Throat: Hearing grossly intact, nares w/o erythema or drainage Eyes: PER, EOMI, sclera nonicteric.  Neck: Supple, no large masses.   Pulmonary:  Good air movement, no audible wheezing bilaterally, no use of accessory muscles.  Cardiac: RRR, no JVD Vascular:  Vessel Right Left  Radial Palpable Palpable  Popliteal Palpable Palpable  PT Palpable Palpable  DP Palpable Palpable  Gastrointestinal: Non-distended. No guarding/no peritoneal signs.  Musculoskeletal: M/S 5/5 throughout.  No deformity or atrophy.  Neurologic: CN 2-12 intact. Symmetrical.  Speech is fluent. Motor exam as listed above. Psychiatric:  Judgment intact, Mood & affect appropriate for pt's clinical situation. Dermatologic: No rashes or ulcers noted.  No changes consistent with cellulitis. Lymph : No lichenification or skin changes of chronic lymphedema.  CBC Lab Results  Component Value Date   WBC 7.8 12/28/2016   HGB 12.2 12/28/2016   HCT 36.0 12/28/2016   MCV 82.8 12/28/2016   PLT 289 12/28/2016    BMET    Component Value Date/Time   NA 134 (L) 12/28/2016 0958   NA 130 (L) 10/09/2014 0846   K 3.7 12/28/2016 0958   K 4.1 10/09/2014 0846   CL 100 (L) 12/28/2016 0958   CL 98 (L) 10/09/2014 0846   CO2 27 12/28/2016 0958   CO2 25 10/09/2014 0846   GLUCOSE 140 (H) 12/28/2016 0958   GLUCOSE 161 (H) 10/09/2014 0846   BUN 22 (H) 12/28/2016 0958   BUN 14 10/09/2014 0846   CREATININE 0.96 12/28/2016 0958   CREATININE 0.70 10/09/2014 0846   CALCIUM 9.8 12/28/2016 0958   CALCIUM 9.1 10/09/2014 0846   GFRNONAA 53 (L) 12/28/2016 0958   GFRNONAA >60 10/09/2014 0846   GFRAA >60 12/28/2016 0958   GFRAA >60 10/09/2014 0846   CrCl cannot be calculated (Unknown ideal weight.).  COAG Lab Results  Component Value Date   INR 1.0 03/18/2014   INR 1.0 03/18/2014   PROTIME 13.2 03/18/2014    Radiology Nm Pet Image Restag (ps) Skull Base To Thigh  Result Date: 12/27/2016 CLINICAL DATA:  Subsequent treatment strategy for right-sided lung cancer. EXAM: NUCLEAR MEDICINE PET SKULL BASE TO THIGH TECHNIQUE: 12.55 mCi F-18 FDG was injected intravenously. Full-ring PET imaging was performed from the skull base to thigh after the radiotracer. CT data was obtained and used for attenuation correction and anatomic localization. FASTING BLOOD GLUCOSE:  Value: 101 mg/dl COMPARISON:  PET-CT 06/14/2016. FINDINGS: NECK No hypermetabolic lymph nodes in the neck. CHEST No hypermetabolic mediastinal or hilar nodes. No suspicious pulmonary nodules on the CT scan. There is again some mass-like architectural distortion in the paramediastinal aspect  of the right upper lobe which is similar in appearance to the prior examination and has a central nodular appearing area that measures approximately 1.7 cm in diameter (axial image 83 of series 3) which is stable in size compared to the prior study, and demonstrates only low-level metabolic activity (SUVmax = 2.5 (previously 2.8)), presumably a benign area of postradiation mass-like fibrosis. No acute consolidative airspace disease. No pleural effusions. Mild scarring in the left lung base. There is aortic atherosclerosis, as well as atherosclerosis of the great vessels of the mediastinum and the coronary arteries, including calcified atherosclerotic plaque in the left main, left anterior descending, left circumflex and right coronary arteries. Calcifications of the aortic valve (mild). ABDOMEN/PELVIS No abnormal hypermetabolic activity within the liver, pancreas, adrenal glands, or spleen. No hypermetabolic lymph nodes in the abdomen or  pelvis. Status post cholecystectomy. Aortic atherosclerosis with aneurysmal dilatation of the infrarenal abdominal aorta which measures up to 4.1 x 3.6 cm. Colonic diverticulosis without evidence of acute diverticulitis at this time. Notably, in the proximal sigmoid colon in the midst of an area of extensive diverticular disease there is some focal mural thickening best appreciated on axial image 220 of series 3 which demonstrates hypermetabolism (SUVmax = 10.0). Status post hysterectomy. No significant volume of ascites. No pneumoperitoneum. No pathologic distention of small bowel. SKELETON No focal hypermetabolic activity to suggest skeletal metastasis. IMPRESSION: 1. Chronic post radiation changes in the paramediastinal aspect of the right upper lobe are most compatible with postradiation mass-like fibrosis. No definite findings to suggest local recurrence of disease or metastatic disease in the neck, chest, abdomen or pelvis. 2. Colonic diverticulosis. Although there are no  findings to suggest an acute diverticulitis at this time, there is an area of masslike thickening of the proximal sigmoid colon which demonstrates hypermetabolism (SUVmax = 10.0). Although this may simply represent chronic inflammatory changes related to underlying diverticular disease, the possibility of colonic neoplasm should be considered. If there has not been a recent colonoscopy performed, further evaluation with nonemergent colonoscopy is suggested in the near future to exclude colonic neoplasm. 3. Aortic atherosclerosis, in addition to left main and 3 vessel coronary artery disease. In addition, there is an infrarenal abdominal aortic aneurysm which measures up to 4.1 x 3.6 cm. Recommend followup by ultrasound in 1 year. This recommendation follows ACR consensus guidelines: White Paper of the ACR Incidental Findings Committee II on Vascular Findings. J Am Coll Radiol 2013; 10:789-794. 4. Additional incidental findings, as above. Aortic Atherosclerosis (ICD10-I70.0). Electronically Signed   By: Vinnie Langton M.D.   On: 12/27/2016 14:57    Assessment/Plan 1. Abdominal aortic aneurysm (AAA) without rupture ([place]) No surgery or intervention at this time.  I have reviewed the recent PET scan as well as the PET scan from a year ago. There has been a 2 mm change in that time. For some reason the duplex ultrasounds are documenting a slightly smaller sac size. A comparison between the most recent PET scan and her most recent ultrasound certainly made it appear that there was rapid growth however in comparing the PET scan to the PET scan year ago there is no significant enlargement and the patient does not need treatment of her aneurysm at this time.  The patient has an asymptomatic abdominal aortic aneurysm that is less than 4 cm in maximal diameter.  I have discussed the natural history of abdominal aortic aneurysm and the small risk of rupture for aneurysm less than 5 cm in size.  However, as these  small aneurysms tend to enlarge over time, continued surveillance with ultrasound or CT scan is mandatory.   I have also discussed optimizing medical management with hypertension and lipid control and the importance of abstinence from tobacco.  The patient is also encouraged to exercise a minimum of 30 minutes 4 times a week.  Should the patient develop new onset abdominal or back pain or signs of peripheral embolization they are instructed to seek medical attention immediately and to alert the physician providing care that they have an aneurysm.  The patient voices their understanding. The patient will return in 12 months with an aortic duplex.   2. Essential hypertension Continue antihypertensive medications as already ordered, these medications have been reviewed and there are no changes at this time.   3. Gastroesophageal reflux disease, esophagitis presence not  specified Continue PPI as already ordered, these medications have been reviewed and there are no changes at this time.   4. Cancer of upper lobe of right lung ([place]) Continue chemotherapy as already ordered, these medications have been reviewed and there are no changes at this time.    Hortencia Pilar, MD  01/12/2017 9:28 AM

## 2017-02-21 ENCOUNTER — Other Ambulatory Visit: Payer: Self-pay | Admitting: Internal Medicine

## 2017-02-22 ENCOUNTER — Ambulatory Visit (INDEPENDENT_AMBULATORY_CARE_PROVIDER_SITE_OTHER): Payer: Medicare Other | Admitting: *Deleted

## 2017-02-22 DIAGNOSIS — Z23 Encounter for immunization: Secondary | ICD-10-CM

## 2017-04-03 ENCOUNTER — Ambulatory Visit (INDEPENDENT_AMBULATORY_CARE_PROVIDER_SITE_OTHER): Payer: Medicare Other | Admitting: Vascular Surgery

## 2017-04-03 ENCOUNTER — Other Ambulatory Visit (INDEPENDENT_AMBULATORY_CARE_PROVIDER_SITE_OTHER): Payer: Medicare Other

## 2017-04-04 ENCOUNTER — Encounter: Payer: Self-pay | Admitting: Emergency Medicine

## 2017-04-04 DIAGNOSIS — I10 Essential (primary) hypertension: Secondary | ICD-10-CM | POA: Insufficient documentation

## 2017-04-04 DIAGNOSIS — Z79899 Other long term (current) drug therapy: Secondary | ICD-10-CM | POA: Insufficient documentation

## 2017-04-04 DIAGNOSIS — Z87891 Personal history of nicotine dependence: Secondary | ICD-10-CM | POA: Insufficient documentation

## 2017-04-04 LAB — CBC
HCT: 37.2 % (ref 35.0–47.0)
Hemoglobin: 12.4 g/dL (ref 12.0–16.0)
MCH: 28.2 pg (ref 26.0–34.0)
MCHC: 33.4 g/dL (ref 32.0–36.0)
MCV: 84.3 fL (ref 80.0–100.0)
PLATELETS: 303 10*3/uL (ref 150–440)
RBC: 4.41 MIL/uL (ref 3.80–5.20)
RDW: 14.8 % — AB (ref 11.5–14.5)
WBC: 10.2 10*3/uL (ref 3.6–11.0)

## 2017-04-04 LAB — BASIC METABOLIC PANEL
Anion gap: 13 (ref 5–15)
BUN: 20 mg/dL (ref 6–20)
CALCIUM: 10.1 mg/dL (ref 8.9–10.3)
CHLORIDE: 100 mmol/L — AB (ref 101–111)
CO2: 25 mmol/L (ref 22–32)
CREATININE: 1.04 mg/dL — AB (ref 0.44–1.00)
GFR calc non Af Amer: 48 mL/min — ABNORMAL LOW (ref >60)
GFR, EST AFRICAN AMERICAN: 56 mL/min — AB (ref >60)
Glucose, Bld: 139 mg/dL — ABNORMAL HIGH (ref 65–99)
Potassium: 3.7 mmol/L (ref 3.5–5.1)
SODIUM: 138 mmol/L (ref 135–145)

## 2017-04-04 LAB — TROPONIN I: Troponin I: <0.03 ng/mL (ref <0.03)

## 2017-04-04 NOTE — ED Triage Notes (Signed)
Patient to ER for c/o HTN tonight at home with BP result of 188/92. Patient states she felt flushed in the face and saw black floaters in vision at that time. Patient denies any changes in medications.

## 2017-04-05 ENCOUNTER — Other Ambulatory Visit: Payer: Self-pay | Admitting: Internal Medicine

## 2017-04-05 ENCOUNTER — Telehealth: Payer: Self-pay | Admitting: Internal Medicine

## 2017-04-05 ENCOUNTER — Emergency Department
Admission: EM | Admit: 2017-04-05 | Discharge: 2017-04-05 | Disposition: A | Discharge status: Home / Self Care | Payer: Medicare Other | Attending: Emergency Medicine | Admitting: Emergency Medicine

## 2017-04-05 DIAGNOSIS — I10 Essential (primary) hypertension: Secondary | ICD-10-CM

## 2017-04-05 NOTE — Telephone Encounter (Signed)
Patient scheduled for Friday at 10:30 for ED follow up

## 2017-04-05 NOTE — ED Notes (Signed)
Pt to the er for elevated BP. Pt states she had floaters in her eyes and asked her son to take her bp. BP this morning was 153/77. Pt denied headache or dizziness during the day. Pt said she had been busy all day with family emergency. Son took BP tonight at 2200 and it was 188/90. Pt denies any headache dizziness or nausea. Pt has been on Benicar and daughter thinks the med switching to generic may have contributed to it.

## 2017-04-05 NOTE — Telephone Encounter (Signed)
Pt script given on 04/14/16 #90 with 1 rf  Last o/v 08/12/16 F/u 10826/[id number]

## 2017-04-05 NOTE — ED Provider Notes (Signed)
[facility] Emergency Department Provider Note   ____________________________________________   First MD Initiated Contact with Patient 04/05/17 0413     (approximate)  I have reviewed the triage vital signs and the nursing notes.   HISTORY  Chief Complaint Hypertension    HPI Heather Cooper is a 83 y.o. female who comes into the hospital today with elevated blood pressure. The patient reports her blood pressure went up to 180/90. The patient centered around 9 PM. This morning at 11 AM the patient's blood pressures 155/77. The patient's daughter states that it's normally in the 130s. The patient has had some stress because she's been having to do some care management for a great aunt today. The patient did have some floaters earlier in the afternoon but nothing this evening. She took her blood pressure this morning and just wanted her son's take it tonight to see where it was. The patient has had no symptoms. She did not call her doctor's office. The patient was doing a lot of running around a lot today which is not normal for her. The patient denies any blurred vision any headache any chest pain any shortness of breath any numbness weakness or tingling anywhere. The patient's daughter brought her in because her blood pressure was elevated.   Past Medical History:  Diagnosis Date  . Chicken pox   . Hypercalcemia    familial hypocalciuric hypercalcemia  . Hypercholesterolemia   . Hypertension   . Lung cancer ([place])   . Osteoporosis   . Thyroid disease     Patient Active Problem List   Diagnosis Date Noted  . URI (upper respiratory infection) 09/16/2016  . Health care maintenance 05/18/2016  . Cancer of upper lobe of right lung ([place]) 02/08/2016  . Abdominal aortic aneurysm ([place]) 08/20/2015  . Abnormal CXR 03/06/2014  . Headache(784.0) 03/06/2014  . Hyperglycemia 02/07/2013  . Hypertension 06/26/2012  . Hypercholesterolemia 06/26/2012  .  Osteoporosis 06/26/2012  . Hypercalcemia 06/26/2012  . GERD (gastroesophageal reflux disease) 06/26/2012    Past Surgical History:  Procedure Laterality Date  . ABDOMINAL HYSTERECTOMY     partial  . CHOLECYSTECTOMY    . transvaginal hysterectomy  04/18/05   with anterior colporrhaphy    Prior to Admission medications   Medication Sig Start Date End Date Taking? Authorizing Provider  fluticasone (FLONASE) 50 MCG/ACT nasal spray Place 2 sprays into both nostrils daily. 09/16/16   Coral Spikes, DO  levocetirizine (XYZAL) 5 MG tablet Take 1 tablet (5 mg total) by mouth every evening. 09/16/16   Coral Spikes, DO  nystatin cream (MYCOSTATIN) Apply 1 application topically 2 (two) times daily. 10/04/16   [name], MD  olmesartan-hydrochlorothiazide (BENICAR HCT) 20-12.5 MG tablet TAKE 1 TABLET BY MOUTH ONCE DAILY 02/21/17   [name], MD  pantoprazole (PROTONIX) 40 MG tablet Take 1 tablet (40 mg total) by mouth daily. 04/14/16   [name], MD    Allergies Paclitaxel  Family History  Problem Relation Age of Onset  . Stroke Mother   . Hypertension Mother   . Prostate cancer Father   . Cancer Father        prostate  . Heart disease Brother        s/p CABG  . Colon cancer Neg Hx     Social History Social History  Substance Use Topics  . Smoking status: Former Smoker    Quit date: 06/13/1997  . Smokeless tobacco: Never Used  . Alcohol use No  Review of Systems  Constitutional: No fever/chills Eyes: No visual changes. ENT: No sore throat. Cardiovascular: Denies chest pain. Respiratory: Denies shortness of breath. Gastrointestinal: No abdominal pain.  No nausea, no vomiting.  No diarrhea.  No constipation. Genitourinary: Negative for dysuria. Musculoskeletal: Negative for back pain. Skin: Negative for rash. Neurological: Negative for headaches, focal weakness or numbness.   ____________________________________________   PHYSICAL EXAM:  VITAL SIGNS: ED  Triage Vitals [04/04/17 2251]  Enc Vitals Group     BP (!) 158/95     Pulse Rate 100     Resp 18     Temp 97.6 F (36.4 C)     Temp Source Oral     SpO2 98 %     Weight 159 lb (72.1 kg)     Height 5\' 4"  (1.626 m)     Head Circumference      Peak Flow      Pain Score      Pain Loc      Pain Edu?      Excl. in [place]?     Constitutional: Alert and oriented. Well appearing and in no acute distress. Eyes: Conjunctivae are normal. PERRL. EOMI. Head: Atraumatic. Nose: No congestion/rhinnorhea. Mouth/Throat: Mucous membranes are moist.  Oropharynx non-erythematous. Cardiovascular: Normal rate, regular rhythm. Grossly normal heart sounds.  Good peripheral circulation. Respiratory: Normal respiratory effort.  No retractions. Lungs CTAB. Gastrointestinal: Soft and nontender. No distention. positive bowel sounds Musculoskeletal: No lower extremity tenderness nor edema.   Neurologic:  Normal speech and language.  Skin:  Skin is warm, dry and intact.  Psychiatric: Mood and affect are normal.   ____________________________________________   LABS (all labs ordered are listed, but only abnormal results are displayed)  Labs Reviewed  BASIC METABOLIC PANEL - Abnormal; Notable for the following:       Result Value   Chloride 100 (*)    Glucose, Bld 139 (*)    Creatinine, Ser 1.04 (*)    GFR calc non Af Amer 48 (*)    GFR calc Af Amer 56 (*)    All other components within normal limits  CBC - Abnormal; Notable for the following:    RDW 14.8 (*)    All other components within normal limits  TROPONIN I   ____________________________________________  EKG  ED ECG REPORT I, Loney Hering, the attending physician, personally viewed and interpreted this ECG.   Date: 04/04/2017  EKG Time: 2308  Rate: 89  Rhythm: normal sinus rhythm  Axis: normal  Intervals:none  ST&T Change: none  ____________________________________________  RADIOLOGY  No results  found.  ____________________________________________   PROCEDURES  Procedure(s) performed: None  Procedures  Critical Care performed: No  ____________________________________________   INITIAL IMPRESSION / ASSESSMENT AND PLAN / ED COURSE  As part of my medical decision making, I reviewed the following data within the electronic MEDICAL RECORD NUMBER Notes from prior ED visits and [place] Controlled Substance Database   This is an 83 year old female who comes into the hospital today with elevated blood pressure. When the patient arrived to the hospital her blood pressure was in the 834 systolic. She came back into the room and was not in any distress. Her initial blood pressure when she came back was 196 systolic but I told her that her blood work is unremarkable and her EKG looked good. We did have the patient rest with her legs up in the bed and her blood pressure came down to 148/77 without any intervention. I  feel that the patient's blood pressure was elevated due to her increased activity and stress over the last 24-48 hours. I spent to the patient that she does need to take it easy and to follow-up with her doctor. The patient has no other complaints. She'll be discharged home to follow-up.      ____________________________________________   FINAL CLINICAL IMPRESSION(S) / ED DIAGNOSES  Final diagnoses:  Hypertension, unspecified type      NEW MEDICATIONS STARTED DURING THIS VISIT:  Discharge Medication List as of 04/05/2017  5:13 AM       Note:  This document was prepared using Dragon voice recognition software and may include unintentional dictation errors.    Loney Hering, MD 04/05/17 [phone]

## 2017-04-05 NOTE — Discharge Instructions (Signed)
Your workup at this time is unremarkable. Please follow up with her primary care physician for further evaluation of her blood pressure.

## 2017-04-05 NOTE — Telephone Encounter (Signed)
Pt called and stated that she went to the ED for blood pressure . Pt's bp was 188/92. They advised that she come and see Dr. Nicki Reaper within 2 days. Please advise, thank you!  Call pt @ [phone]

## 2017-04-06 NOTE — Telephone Encounter (Signed)
ok'd rex for protonix #30 with one refill.

## 2017-04-07 ENCOUNTER — Ambulatory Visit (INDEPENDENT_AMBULATORY_CARE_PROVIDER_SITE_OTHER): Payer: Medicare Other | Admitting: Internal Medicine

## 2017-04-07 ENCOUNTER — Encounter: Payer: Self-pay | Admitting: Internal Medicine

## 2017-04-07 DIAGNOSIS — R739 Hyperglycemia, unspecified: Secondary | ICD-10-CM | POA: Diagnosis not present

## 2017-04-07 DIAGNOSIS — E7800 Pure hypercholesterolemia, unspecified: Secondary | ICD-10-CM | POA: Diagnosis not present

## 2017-04-07 DIAGNOSIS — I714 Abdominal aortic aneurysm, without rupture, unspecified: Secondary | ICD-10-CM

## 2017-04-07 DIAGNOSIS — I10 Essential (primary) hypertension: Secondary | ICD-10-CM

## 2017-04-07 DIAGNOSIS — K219 Gastro-esophageal reflux disease without esophagitis: Secondary | ICD-10-CM

## 2017-04-07 DIAGNOSIS — C3411 Malignant neoplasm of upper lobe, right bronchus or lung: Secondary | ICD-10-CM

## 2017-04-07 NOTE — Progress Notes (Signed)
Patient ID: SHAWNTEL FARNWORTH, female   DOB: [birth year], 83 y.o.   MRN: [id number]   Subjective:    Patient ID: AGAPE HARDIMAN, female    DOB: [birth year], 83 y.o.   MRN: [id number]  HPI  Patient here for ER follow up.  She was seen in the ER on 04/05/17 for elevated blood pressures.  She has been under increased stress and had a stressful day the day she went in to the ER.  No intervention made.  Blood pressure has improved.  Outside readings reviewed.  Blood pressure averaging 116-130/60s (now).  Stays active.  No chest pain.  No sob.  No acid reflux.  No abdominal pain.  Bowels moving.  Prior to ER - saw floaters.  No vision change.  No vision loss.  No neurological changes.     Past Medical History:  Diagnosis Date  . Chicken pox   . Hypercalcemia    familial hypocalciuric hypercalcemia  . Hypercholesterolemia   . Hypertension   . Lung cancer ([place])   . Osteoporosis   . Thyroid disease    Past Surgical History:  Procedure Laterality Date  . ABDOMINAL HYSTERECTOMY     partial  . CHOLECYSTECTOMY    . transvaginal hysterectomy  04/18/05   with anterior colporrhaphy   Family History  Problem Relation Age of Onset  . Stroke Mother   . Hypertension Mother   . Prostate cancer Father   . Cancer Father        prostate  . Heart disease Brother        s/p CABG  . Colon cancer Neg Hx    Social History   Social History  . Marital status: Married    Spouse name: N/A  . Number of children: 3  . Years of education: N/A   Social History Main Topics  . Smoking status: Former Smoker    Quit date: 06/13/1997  . Smokeless tobacco: Never Used  . Alcohol use No  . Drug use: No  . Sexual activity: Not Asked   Other Topics Concern  . None   Social History Narrative  . None    Outpatient Encounter Prescriptions as of 04/07/2017  Medication Sig  . fluticasone (FLONASE) 50 MCG/ACT nasal spray Place 2 sprays into both nostrils daily.  [name] levocetirizine (XYZAL) 5 MG tablet Take 1  tablet (5 mg total) by mouth every evening.  . nystatin cream (MYCOSTATIN) Apply 1 application topically 2 (two) times daily.  [name] olmesartan-hydrochlorothiazide (BENICAR HCT) 20-12.5 MG tablet TAKE 1 TABLET BY MOUTH ONCE DAILY  . pantoprazole (PROTONIX) 40 MG tablet Take 1 tablet (40 mg total) by mouth daily.  . pantoprazole (PROTONIX) 40 MG tablet TAKE 1 TABLET BY MOUTH ONCE DAILY   Facility-Administered Encounter Medications as of 04/07/2017  Medication  . sodium chloride 0.9 % 1,000 mL with potassium chloride 20 mEq, magnesium sulfate 2 g infusion    Review of Systems  Constitutional: Negative for appetite change and unexpected weight change.  HENT: Negative for congestion and sinus pressure.   Respiratory: Negative for cough, chest tightness and shortness of breath.   Cardiovascular: Negative for chest pain, palpitations and leg swelling.  Gastrointestinal: Negative for abdominal pain, diarrhea, nausea and vomiting.  Genitourinary: Negative for difficulty urinating and dysuria.  Musculoskeletal: Negative for back pain and joint swelling.  Skin: Negative for color change and rash.  Neurological: Negative for dizziness, light-headedness and headaches.  Psychiatric/Behavioral: Negative for agitation and dysphoric mood.  Objective:    Physical Exam  Constitutional: She appears well-developed and well-nourished. No distress.  HENT:  Nose: Nose normal.  Mouth/Throat: Oropharynx is clear and moist.  Neck: Neck supple. No thyromegaly present.  Cardiovascular: Normal rate and regular rhythm.   Pulmonary/Chest: Breath sounds normal. No respiratory distress. She has no wheezes.  Abdominal: Soft. Bowel sounds are normal. There is no tenderness.  Musculoskeletal: She exhibits no edema or tenderness.  Lymphadenopathy:    She has no cervical adenopathy.  Skin: No rash noted. No erythema.  Psychiatric: She has a normal mood and affect. Her behavior is normal.    BP 134/68 (BP  Location: Left Arm, Patient Position: Sitting, Cuff Size: Normal)   Pulse 96   Temp 98.6 F (37 C) (Oral)   Resp 14   Wt 158 lb 3.2 oz (71.8 kg)   SpO2 97%   BMI 27.15 kg/m  Wt Readings from Last 3 Encounters:  04/07/17 158 lb 3.2 oz (71.8 kg)  04/04/17 159 lb (72.1 kg)  01/12/17 154 lb (69.9 kg)     Lab Results  Component Value Date   WBC 10.2 04/04/2017   HGB 12.4 04/04/2017   HCT 37.2 04/04/2017   PLT 303 04/04/2017   GLUCOSE 139 (H) 04/04/2017   CHOL 184 08/12/2016   TRIG 122.0 08/12/2016   HDL 47.60 08/12/2016   LDLCALC 112 (H) 08/12/2016   ALT 16 12/28/2016   AST 20 12/28/2016   NA 138 04/04/2017   K 3.7 04/04/2017   CL 100 (L) 04/04/2017   CREATININE 1.04 (H) 04/04/2017   BUN 20 04/04/2017   CO2 25 04/04/2017   TSH 0.64 04/14/2016   INR 1.0 03/18/2014   INR 1.0 03/18/2014   HGBA1C 6.8 (H) 08/12/2016       Assessment & Plan:   Problem List Items Addressed This Visit    Abdominal aortic aneurysm ([place])    Evaluated by Dr Ronalee Belts.  Stable.  Evaluated 03/31/16 - stable.  Recommended f/u in 12 months.        Cancer of upper lobe of right lung ([place])    Followed by oncology.        GERD (gastroesophageal reflux disease)    Controlled on protonix.        Hypercalcemia    Followed by Dr Forde Dandy.        Hypercholesterolemia    Low cholesterol diet and exercise.  Follow lipid panel and liver function tests.        Relevant Orders   Lipid panel   Hepatic function panel   Hyperglycemia    Low carb diet and exercise.  Follow met b and a1c.        Relevant Orders   Hemoglobin A1c   Hypertension    Blood pressure improved.  Same medication regimen.  Follow pressures.  Follow metabolic panel.        Relevant Orders   TSH   Basic metabolic panel       [name], MD

## 2017-04-09 ENCOUNTER — Encounter: Payer: Self-pay | Admitting: Internal Medicine

## 2017-04-09 NOTE — Assessment & Plan Note (Signed)
Low cholesterol diet and exercise.  Follow lipid panel and liver function tests.  

## 2017-04-09 NOTE — Assessment & Plan Note (Signed)
Followed by Dr Forde Dandy.

## 2017-04-09 NOTE — Assessment & Plan Note (Signed)
Followed by oncology 

## 2017-04-09 NOTE — Assessment & Plan Note (Signed)
Controlled on protonix.   

## 2017-04-09 NOTE — Assessment & Plan Note (Signed)
Blood pressure improved.  Same medication regimen.  Follow pressures.  Follow metabolic panel.

## 2017-04-09 NOTE — Assessment & Plan Note (Signed)
Evaluated by Dr Ronalee Belts.  Stable.  Evaluated 03/31/16 - stable.  Recommended f/u in 12 months.

## 2017-04-09 NOTE — Assessment & Plan Note (Signed)
Low carb diet and exercise.  Follow met b and a1c.   

## 2017-04-17 ENCOUNTER — Ambulatory Visit: Payer: Medicare Other | Admitting: Anesthesiology

## 2017-04-17 ENCOUNTER — Ambulatory Visit
Admission: RE | Admit: 2017-04-17 | Discharge: 2017-04-17 | Disposition: A | Discharge status: Home / Self Care | Payer: Medicare Other | Source: Ambulatory Visit | Attending: Unknown Physician Specialty | Admitting: Unknown Physician Specialty

## 2017-04-17 ENCOUNTER — Encounter
Admission: RE | Disposition: A | Discharge status: Home / Self Care | Payer: Self-pay | Source: Ambulatory Visit | Attending: Unknown Physician Specialty

## 2017-04-17 ENCOUNTER — Encounter: Payer: Self-pay | Admitting: *Deleted

## 2017-04-17 DIAGNOSIS — Z8249 Family history of ischemic heart disease and other diseases of the circulatory system: Secondary | ICD-10-CM | POA: Insufficient documentation

## 2017-04-17 DIAGNOSIS — Z87891 Personal history of nicotine dependence: Secondary | ICD-10-CM | POA: Insufficient documentation

## 2017-04-17 DIAGNOSIS — E7800 Pure hypercholesterolemia, unspecified: Secondary | ICD-10-CM | POA: Insufficient documentation

## 2017-04-17 DIAGNOSIS — K640 First degree hemorrhoids: Secondary | ICD-10-CM | POA: Insufficient documentation

## 2017-04-17 DIAGNOSIS — Z888 Allergy status to other drugs, medicaments and biological substances status: Secondary | ICD-10-CM | POA: Insufficient documentation

## 2017-04-17 DIAGNOSIS — K5520 Angiodysplasia of colon without hemorrhage: Secondary | ICD-10-CM | POA: Insufficient documentation

## 2017-04-17 DIAGNOSIS — I10 Essential (primary) hypertension: Secondary | ICD-10-CM | POA: Insufficient documentation

## 2017-04-17 DIAGNOSIS — Z90710 Acquired absence of both cervix and uterus: Secondary | ICD-10-CM | POA: Insufficient documentation

## 2017-04-17 DIAGNOSIS — K5730 Diverticulosis of large intestine without perforation or abscess without bleeding: Secondary | ICD-10-CM | POA: Insufficient documentation

## 2017-04-17 DIAGNOSIS — K219 Gastro-esophageal reflux disease without esophagitis: Secondary | ICD-10-CM | POA: Diagnosis not present

## 2017-04-17 DIAGNOSIS — R933 Abnormal findings on diagnostic imaging of other parts of digestive tract: Secondary | ICD-10-CM | POA: Diagnosis present

## 2017-04-17 DIAGNOSIS — Z8042 Family history of malignant neoplasm of prostate: Secondary | ICD-10-CM | POA: Diagnosis not present

## 2017-04-17 DIAGNOSIS — Z9049 Acquired absence of other specified parts of digestive tract: Secondary | ICD-10-CM | POA: Insufficient documentation

## 2017-04-17 DIAGNOSIS — Z85118 Personal history of other malignant neoplasm of bronchus and lung: Secondary | ICD-10-CM | POA: Diagnosis not present

## 2017-04-17 DIAGNOSIS — Z823 Family history of stroke: Secondary | ICD-10-CM | POA: Insufficient documentation

## 2017-04-17 DIAGNOSIS — Z79899 Other long term (current) drug therapy: Secondary | ICD-10-CM | POA: Diagnosis not present

## 2017-04-17 DIAGNOSIS — R51 Headache: Secondary | ICD-10-CM | POA: Insufficient documentation

## 2017-04-17 DIAGNOSIS — M810 Age-related osteoporosis without current pathological fracture: Secondary | ICD-10-CM | POA: Diagnosis not present

## 2017-04-17 DIAGNOSIS — E079 Disorder of thyroid, unspecified: Secondary | ICD-10-CM | POA: Diagnosis not present

## 2017-04-17 HISTORY — PX: COLONOSCOPY WITH PROPOFOL: SHX5780

## 2017-04-17 SURGERY — COLONOSCOPY WITH PROPOFOL
Anesthesia: General

## 2017-04-17 MED ORDER — SODIUM CHLORIDE 0.9 % IV SOLN
INTRAVENOUS | Status: DC
  Administered 2017-04-17 (×2): via INTRAVENOUS

## 2017-04-17 MED ORDER — SODIUM CHLORIDE 0.9 % IV SOLN: INTRAVENOUS | Status: DC

## 2017-04-17 MED ORDER — PROPOFOL 10 MG/ML IV BOLUS
INTRAVENOUS | Status: DC | PRN
  Administered 2017-04-17: 80 mg via INTRAVENOUS
  Administered 2017-04-17: 20 mg via INTRAVENOUS

## 2017-04-17 MED ORDER — FENTANYL CITRATE (PF) 100 MCG/2ML IJ SOLN
INTRAMUSCULAR | Status: AC
  Filled 2017-04-17: qty 2

## 2017-04-17 MED ORDER — PHENYLEPHRINE HCL 10 MG/ML IJ SOLN
INTRAMUSCULAR | Status: DC | PRN
  Administered 2017-04-17 (×3): 100 ug via INTRAVENOUS

## 2017-04-17 MED ORDER — PROPOFOL 500 MG/50ML IV EMUL
INTRAVENOUS | Status: AC
  Filled 2017-04-17: qty 50

## 2017-04-17 MED ORDER — PROPOFOL 500 MG/50ML IV EMUL
INTRAVENOUS | Status: DC | PRN
  Administered 2017-04-17: 140 ug/kg/min via INTRAVENOUS

## 2017-04-17 MED ORDER — FENTANYL CITRATE (PF) 100 MCG/2ML IJ SOLN
INTRAMUSCULAR | Status: DC | PRN
  Administered 2017-04-17 (×2): 50 ug via INTRAVENOUS

## 2017-04-17 MED ORDER — GLYCOPYRROLATE 0.2 MG/ML IJ SOLN
INTRAMUSCULAR | Status: AC
  Filled 2017-04-17: qty 2

## 2017-04-17 MED ORDER — LIDOCAINE 2% (20 MG/ML) 5 ML SYRINGE
INTRAMUSCULAR | Status: DC | PRN
  Administered 2017-04-17: 30 mg via INTRAVENOUS

## 2017-04-17 NOTE — Anesthesia Post-op Follow-up Note (Signed)
Anesthesia QCDR form completed.        

## 2017-04-17 NOTE — Anesthesia Postprocedure Evaluation (Signed)
Anesthesia Post Note  Patient: Heather Cooper  Procedure(s) Performed: COLONOSCOPY WITH PROPOFOL (N/A )  Patient location during evaluation: Endoscopy Anesthesia Type: General Level of consciousness: awake and alert and oriented Pain management: pain level controlled Vital Signs Assessment: post-procedure vital signs reviewed and stable Respiratory status: spontaneous breathing, nonlabored ventilation and respiratory function stable Cardiovascular status: blood pressure returned to baseline and stable Postop Assessment: no signs of nausea or vomiting Anesthetic complications: no     Last Vitals:  Vitals:   04/17/17 1450 04/17/17 1500  BP: (!) 106/50 (!) 115/45  Pulse: 76 73  Resp: 17 18  Temp:    SpO2: 100% 100%    Last Pain:  Vitals:   04/17/17 1430  TempSrc: Tympanic                 Georgiann Neider

## 2017-04-17 NOTE — H&P (Signed)
Primary Care Physician:  [name], MD Primary Gastroenterologist:  Dr. Vira Agar  Pre-Procedure History & Physical: HPI:  Heather Cooper is a 83 y.o. female is here for an colonoscopy.   Past Medical History:  Diagnosis Date  . Chicken pox   . Hypercalcemia    familial hypocalciuric hypercalcemia  . Hypercholesterolemia   . Hypertension   . Lung cancer ([place])   . Osteoporosis   . Thyroid disease     Past Surgical History:  Procedure Laterality Date  . ABDOMINAL HYSTERECTOMY     partial  . CHOLECYSTECTOMY    . transvaginal hysterectomy  04/18/05   with anterior colporrhaphy    Prior to Admission medications   Medication Sig Start Date End Date Taking? Authorizing Provider  olmesartan-hydrochlorothiazide (BENICAR HCT) 20-12.5 MG tablet TAKE 1 TABLET BY MOUTH ONCE DAILY 02/21/17  Yes [name], MD  pantoprazole (PROTONIX) 40 MG tablet Take 1 tablet (40 mg total) by mouth daily. 04/14/16  Yes [name], MD  fluticasone (FLONASE) 50 MCG/ACT nasal spray Place 2 sprays into both nostrils daily. Patient not taking: Reported on 04/17/2017 09/16/16   Coral Spikes, DO  levocetirizine (XYZAL) 5 MG tablet Take 1 tablet (5 mg total) by mouth every evening. Patient not taking: Reported on 04/17/2017 09/16/16   Coral Spikes, DO  nystatin cream (MYCOSTATIN) Apply 1 application topically 2 (two) times daily. Patient not taking: Reported on 04/17/2017 10/04/16   [name], MD  pantoprazole (PROTONIX) 40 MG tablet TAKE 1 TABLET BY MOUTH ONCE DAILY 04/06/17   [name], MD    Allergies as of 02/06/2017 - Review Complete 01/13/2017  Allergen Reaction Noted  . Paclitaxel Other (See Comments) 09/13/2014    Family History  Problem Relation Age of Onset  . Stroke Mother   . Hypertension Mother   . Prostate cancer Father   . Cancer Father        prostate  . Heart disease Brother        s/p CABG  . Colon cancer Neg Hx     Social History   Socioeconomic History   . Marital status: Married    Spouse name: Not on file  . Number of children: 3  . Years of education: Not on file  . Highest education level: Not on file  Social Needs  . Financial resource strain: Not on file  . Food insecurity - worry: Not on file  . Food insecurity - inability: Not on file  . Transportation needs - medical: Not on file  . Transportation needs - non-medical: Not on file  Occupational History  . Not on file  Tobacco Use  . Smoking status: Former Smoker    Last attempt to quit: 06/13/1997    Years since quitting: 19.8  . Smokeless tobacco: Never Used  Substance and Sexual Activity  . Alcohol use: No    Alcohol/week: 0.0 oz  . Drug use: No  . Sexual activity: Not on file  Other Topics Concern  . Not on file  Social History Narrative  . Not on file    Review of Systems: See HPI, otherwise negative ROS  Physical Exam: BP (!) 106/54   Pulse 86   Temp (!) 96.2 F (35.7 C) (Oral)   Resp 16   Ht 5\' 4"  (1.626 m)   Wt 71.7 kg (158 lb)   SpO2 100%   BMI 27.12 kg/m  General:   Alert,  pleasant and cooperative in NAD Head:  Normocephalic  and atraumatic. Neck:  Supple; no masses or thyromegaly. Lungs:  Clear throughout to auscultation.    Heart:  Regular rate and rhythm. Abdomen:  Soft, nontender and nondistended. Normal bowel sounds, without guarding, and without rebound.   Neurologic:  Alert and  oriented x4;  grossly normal neurologically.  Impression/Plan: [name] is here for an colonoscopy to be performed for abnormal CT scan of sigmoid colon.  Risks, benefits, limitations, and alternatives regarding  colonoscopy have been reviewed with the patient.  Questions have been answered.  All parties agreeable.   [name], MD  04/17/2017, 2:02 PM

## 2017-04-17 NOTE — Anesthesia Preprocedure Evaluation (Signed)
Anesthesia Evaluation  Patient identified by MRN, date of birth, ID band Patient awake    Reviewed: Allergy & Precautions, NPO status , Patient's Chart, lab work & pertinent test results  History of Anesthesia Complications Negative for: history of anesthetic complications  Airway Mallampati: II  TM Distance: >3 FB Neck ROM: Full    Dental  (+) Upper Dentures, Lower Dentures   Pulmonary neg sleep apnea, neg COPD, former smoker,  Hx of lung cancer    breath sounds clear to auscultation- rhonchi (-) wheezing      Cardiovascular hypertension, Pt. on medications (-) CAD, (-) Past MI and (-) Cardiac Stents  Rhythm:Regular Rate:Normal - Systolic murmurs and - Diastolic murmurs    Neuro/Psych  Headaches, negative psych ROS   GI/Hepatic Neg liver ROS, GERD  ,  Endo/Other  negative endocrine ROSneg diabetes  Renal/GU negative Renal ROS     Musculoskeletal negative musculoskeletal ROS (+)   Abdominal (+) - obese,   Peds  Hematology negative hematology ROS (+)   Anesthesia Other Findings Past Medical History: No date: Chicken pox No date: Hypercalcemia     Comment:  familial hypocalciuric hypercalcemia No date: Hypercholesterolemia No date: Hypertension No date: Lung cancer ([facility]) No date: Osteoporosis No date: Thyroid disease   Reproductive/Obstetrics                             Anesthesia Physical Anesthesia Plan  ASA: III  Anesthesia Plan: General   Post-op Pain Management:    Induction: Intravenous  PONV Risk Score and Plan: 3 and Propofol infusion  Airway Management Planned: Natural Airway  Additional Equipment:   Intra-op Plan:   Post-operative Plan:   Informed Consent: I have reviewed the patients History and Physical, chart, labs and discussed the procedure including the risks, benefits and alternatives for the proposed anesthesia with the patient or authorized  representative who has indicated his/her understanding and acceptance.   Dental advisory given  Plan Discussed with: CRNA and Anesthesiologist  Anesthesia Plan Comments:         Anesthesia Quick Evaluation

## 2017-04-17 NOTE — Op Note (Signed)
[facility] Patient Name: Heather Cooper Procedure Date: 04/17/2017 1:59 PM MRN: [id number] Account #: [id number] Date of Birth: 20-Jul-1930 Admit Type: Outpatient Age: 83 Room: [facility] ENDO ROOM 1 Gender: Female Note Status: Finalized Procedure:            Colonoscopy Indications:          Abnormal CT of the GI tract Providers:            Manya Silvas, MD Referring MD:         [name], MD (Referring MD) Medicines:            Propofol per Anesthesia Complications:        No immediate complications. Procedure:            Pre-Anesthesia Assessment:                       - After reviewing the risks and benefits, the patient                        was deemed in satisfactory condition to undergo the                        procedure.                       After obtaining informed consent, the colonoscope was                        passed under direct vision. Throughout the procedure,                        the patient's blood pressure, pulse, and oxygen                        saturations were monitored continuously. The                        Colonoscope was introduced through the anus and                        advanced to the the cecum, identified by appendiceal                        orifice and ileocecal valve. The colonoscopy was                        performed without difficulty. The patient tolerated the                        procedure well. The quality of the bowel preparation                        was good. Findings:      Multiple small-mouthed diverticula were found in the sigmoid colon,       descending colon and transverse colon.      Internal hemorrhoids were found during endoscopy. The hemorrhoids were       small and Grade I (internal hemorrhoids that do not prolapse).      The exam was otherwise without abnormality.      A single small angioectasia without bleeding was found in the  cecum. Impression:           -  Diverticulosis in the sigmoid colon, in the                        descending colon and in the transverse colon.                       - Internal hemorrhoids.                       - The examination was otherwise normal.                       - No specimens collected. Recommendation:       - The findings and recommendations were discussed with                        the patient's family. Manya Silvas, MD 04/17/2017 2:28:07 PM This report has been signed electronically. Number of Addenda: 0 Note Initiated On: 04/17/2017 1:59 PM Scope Withdrawal Time: 0 hours 7 minutes 53 seconds  Total Procedure Duration: 0 hours 18 minutes 53 seconds       [facility]

## 2017-04-17 NOTE — Transfer of Care (Signed)
Immediate Anesthesia Transfer of Care Note  Patient: Heather Cooper  Procedure(s) Performed: COLONOSCOPY WITH PROPOFOL (N/A )  Patient Location: PACU and Endoscopy Unit  Anesthesia Type:General  Level of Consciousness: awake and patient cooperative  Airway & Oxygen Therapy: Patient Spontanous Breathing and Patient connected to nasal cannula oxygen  Post-op Assessment: Report given to RN and Post -op Vital signs reviewed and stable  Post vital signs: Reviewed and stable  Last Vitals:  Vitals:   04/17/17 1300  BP: (!) 106/54  Pulse: 86  Resp: 16  Temp: (!) 35.7 C  SpO2: 100%    Last Pain:  Vitals:   04/17/17 1300  TempSrc: Oral         Complications: No apparent anesthesia complications

## 2017-04-19 ENCOUNTER — Ambulatory Visit (INDEPENDENT_AMBULATORY_CARE_PROVIDER_SITE_OTHER): Payer: Medicare Other | Admitting: Ophthalmology

## 2017-05-10 ENCOUNTER — Encounter (INDEPENDENT_AMBULATORY_CARE_PROVIDER_SITE_OTHER): Payer: Medicare Other | Admitting: Ophthalmology

## 2017-05-30 ENCOUNTER — Encounter (INDEPENDENT_AMBULATORY_CARE_PROVIDER_SITE_OTHER): Payer: Medicare Other | Admitting: Ophthalmology

## 2017-05-30 DIAGNOSIS — H353132 Nonexudative age-related macular degeneration, bilateral, intermediate dry stage: Secondary | ICD-10-CM

## 2017-05-30 DIAGNOSIS — H43813 Vitreous degeneration, bilateral: Secondary | ICD-10-CM | POA: Diagnosis not present

## 2017-05-30 DIAGNOSIS — H35033 Hypertensive retinopathy, bilateral: Secondary | ICD-10-CM

## 2017-05-30 DIAGNOSIS — I10 Essential (primary) hypertension: Secondary | ICD-10-CM

## 2017-05-31 ENCOUNTER — Other Ambulatory Visit: Payer: Medicare Other

## 2017-05-31 ENCOUNTER — Ambulatory Visit: Payer: Medicare Other | Admitting: Internal Medicine

## 2017-06-29 ENCOUNTER — Ambulatory Visit: Payer: Medicare Other | Admitting: Internal Medicine

## 2017-06-29 ENCOUNTER — Other Ambulatory Visit: Payer: Medicare Other

## 2017-06-29 ENCOUNTER — Ambulatory Visit: Payer: Medicare Other

## 2017-07-03 ENCOUNTER — Other Ambulatory Visit: Payer: Self-pay

## 2017-07-03 ENCOUNTER — Inpatient Hospital Stay: Payer: Medicare Other | Attending: Internal Medicine

## 2017-07-03 ENCOUNTER — Other Ambulatory Visit: Payer: Self-pay | Admitting: *Deleted

## 2017-07-03 ENCOUNTER — Inpatient Hospital Stay (HOSPITAL_BASED_OUTPATIENT_CLINIC_OR_DEPARTMENT_OTHER): Payer: Medicare Other | Admitting: Internal Medicine

## 2017-07-03 ENCOUNTER — Encounter: Payer: Self-pay | Admitting: Internal Medicine

## 2017-07-03 VITALS — BP 154/78 | HR 89 | Temp 97.0°F | Resp 20

## 2017-07-03 DIAGNOSIS — I714 Abdominal aortic aneurysm, without rupture: Secondary | ICD-10-CM | POA: Diagnosis not present

## 2017-07-03 DIAGNOSIS — C3411 Malignant neoplasm of upper lobe, right bronchus or lung: Secondary | ICD-10-CM

## 2017-07-03 DIAGNOSIS — R0602 Shortness of breath: Secondary | ICD-10-CM | POA: Insufficient documentation

## 2017-07-03 DIAGNOSIS — R05 Cough: Secondary | ICD-10-CM | POA: Insufficient documentation

## 2017-07-03 LAB — CBC WITH DIFFERENTIAL/PLATELET
BASOS PCT: 1 %
Basophils Absolute: 0.1 10*3/uL (ref 0–0.1)
Eosinophils Absolute: 0.2 10*3/uL (ref 0–0.7)
Eosinophils Relative: 2 %
HEMATOCRIT: 37.2 % (ref 35.0–47.0)
HEMOGLOBIN: 12.5 g/dL (ref 12.0–16.0)
Lymphocytes Relative: 25 %
Lymphs Abs: 2.4 10*3/uL (ref 1.0–3.6)
MCH: 28.6 pg (ref 26.0–34.0)
MCHC: 33.7 g/dL (ref 32.0–36.0)
MCV: 84.8 fL (ref 80.0–100.0)
Monocytes Absolute: 0.8 10*3/uL (ref 0.2–0.9)
Monocytes Relative: 9 %
NEUTROS ABS: 6 10*3/uL (ref 1.4–6.5)
NEUTROS PCT: 63 %
Platelets: 278 10*3/uL (ref 150–440)
RBC: 4.38 MIL/uL (ref 3.80–5.20)
RDW: 14.2 % (ref 11.5–14.5)
WBC: 9.5 10*3/uL (ref 3.6–11.0)

## 2017-07-03 LAB — BASIC METABOLIC PANEL
ANION GAP: 9 (ref 5–15)
BUN: 22 mg/dL — ABNORMAL HIGH (ref 6–20)
CALCIUM: 10.2 mg/dL (ref 8.9–10.3)
CHLORIDE: 100 mmol/L — AB (ref 101–111)
CO2: 28 mmol/L (ref 22–32)
Creatinine, Ser: 0.94 mg/dL (ref 0.44–1.00)
GFR calc non Af Amer: 54 mL/min — ABNORMAL LOW (ref >60)
Glucose, Bld: 98 mg/dL (ref 65–99)
Potassium: 4 mmol/L (ref 3.5–5.1)
SODIUM: 137 mmol/L (ref 135–145)

## 2017-07-03 NOTE — Assessment & Plan Note (Addendum)
Right upper lobe lung cancer adeno ca- Stage IV [contralateral left lower lobe lung nodule] status post chemoradiation-s/p Nivo currently on surveillance.  # July 16th 2018- Restaging PET scan-stable right upper lobe mass/hilar mass; less than centimeter mediastinal adenopathy-STABLE; insurance currently declines PET scan.  # ? Cough/worsenings SOB- recommend CT chest with contrast ASAP.  Question progression.  #2641 PET scan abnormal colonic uptake;  s/p - coloscopy nov 2018- wnl.    # AAA- stable at 3.7 cm; reviwed the notes fromDr.Schneir recommend follow up-duplex  # Call- [phone]/ pt's daughter- [phone] [facility] daughter]; follow-up to be determined.

## 2017-07-03 NOTE — Progress Notes (Signed)
[place] OFFICE PROGRESS NOTE  Patient Care Team: [name], MD as PCP - General (Internal Medicine)  Cancer Staging No matching staging information was found for the patient.   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  Starting radiation and chemotherapy from April 14, 2014 Patient was started on carboplatinum andTaxol Herve were developed an allergic reaction to Taxol so would be changed to Abraxane 3.patient has finished 6 cycles of weekly chemotherapy with carboplatinum  and radiation therapy(May 28, 2014) 4.started on  NIVOLULAMAB because of persistent disease July 02, 2014. 5.  NIVOLULAMAB was discontinued because of persistent diarrhea in July of 2016.  August of 2016 CT scan was stable so no further chemotherapy  # AUG 25th PET- STABLE RUL MASS [radiation fibrosis; <1cm ? Mediastinal recurrence]; repeat PET in 4 m  # AAA/ 3.3x 3.9 Stable [Dr.Schnier]     Cancer of upper lobe of right lung ([place])     INTERVAL HISTORY:  Heather Cooper 84 y.o.  female pleasant patient above history of Right upper lobe adenoca cell Stage IV lung cancer [contralateral lower lobe lung nodule]- currently on surveillance is here for follow-up.  She is accompanied by her son-in-law.  The interim patient had a colonoscopy.  Negative for any malignancy.  Patient complains of progressive shortness of breath and cough.  No hemoptysis.  Patient denies any nausea vomiting. Not losing any weight. No headaches. No tingling and numbness. Appetite is good.  REVIEW OF SYSTEMS:  A complete 10 point review of system is done  which is negative except mentioned above/history of present illness.   PAST MEDICAL HISTORY :  Past Medical History:  Diagnosis Date  . Chicken pox   . Hypercalcemia    familial hypocalciuric hypercalcemia  . Hypercholesterolemia   . Hypertension   . Lung cancer ([place])   . Osteoporosis   . Thyroid disease     PAST SURGICAL HISTORY :   Past Surgical History:  Procedure Laterality Date  . ABDOMINAL HYSTERECTOMY     partial  . CHOLECYSTECTOMY    . COLONOSCOPY WITH PROPOFOL N/A 04/17/2017   Procedure: COLONOSCOPY WITH PROPOFOL;  Surgeon: Manya Silvas, MD;  Location: [facility] ENDOSCOPY;  Service: Endoscopy;  Laterality: N/A;  . transvaginal hysterectomy  04/18/05   with anterior colporrhaphy    FAMILY HISTORY :   Family History  Problem Relation Age of Onset  . Stroke Mother   . Hypertension Mother   . Prostate cancer Father   . Cancer Father        prostate  . Heart disease Brother        s/p CABG  . Colon cancer Neg Hx     SOCIAL HISTORY:   Social History   Tobacco Use  . Smoking status: Former Smoker    Last attempt to quit: 06/13/1997    Years since quitting: 20.0  . Smokeless tobacco: Never Used  Substance Use Topics  . Alcohol use: No    Alcohol/week: 0.0 oz  . Drug use: No    ALLERGIES:  is allergic to paclitaxel.  MEDICATIONS:  Current Outpatient Medications  Medication Sig  Dispense Refill  . olmesartan-hydrochlorothiazide (BENICAR HCT) 20-12.5 MG tablet TAKE 1 TABLET BY MOUTH ONCE DAILY 90 tablet 1  . pantoprazole (PROTONIX) 40 MG tablet Take 1 tablet (40 mg total) by mouth daily. 90 tablet 1   No current facility-administered medications for this visit.    Facility-Administered Medications Ordered in Other Visits  Medication Dose Route Frequency Provider Last Rate Last Dose  . sodium chloride 0.9 % 1,000 mL with potassium chloride 20 mEq, magnesium sulfate 2 g infusion   Intravenous Continuous Choksi, Delorise Shiner, MD   Stopped at 12/04/14 1300     PHYSICAL EXAMINATION: ECOG PERFORMANCE STATUS: 0 - Asymptomatic  BP (!) 154/78   Pulse 89   Temp (!) 97 F (36.1 C) (Tympanic)   Resp 20   There were no vitals filed for this visit.  GENERAL: Well-nourished well-developed; Alert, no distress and comfortable.   With son-in-law.  EYES: no pallor or icterus OROPHARYNX: no thrush or ulceration; good dentition  NECK: supple, no masses felt LYMPH:  no palpable lymphadenopathy in the cervical, axillary or inguinal regions LUNGS: clear to auscultation and  No wheeze or crackles HEART/CVS: regular rate & rhythm and no murmurs; No lower extremity edema ABDOMEN:abdomen soft, non-tender and normal bowel sounds Musculoskeletal:no cyanosis of digits and no clubbing  PSYCH: alert & oriented x 3 with fluent speech NEURO: no focal motor/sensory deficits SKIN:  no rashes or significant lesions  LABORATORY DATA:  I have reviewed the data as listed    Component Value Date/Time   NA 137 07/03/2017 1145   NA 130 (L) 10/09/2014 0846   K 4.0 07/03/2017 1145   K 4.1 10/09/2014 0846   CL 100 (L) 07/03/2017 1145   CL 98 (L) 10/09/2014 0846   CO2 28 07/03/2017 1145   CO2 25 10/09/2014 0846   GLUCOSE 98 07/03/2017 1145   GLUCOSE 161 (H) 10/09/2014 0846   BUN 22 (H) 07/03/2017 1145   BUN 14 10/09/2014 0846   CREATININE 0.94 07/03/2017 1145   CREATININE 0.70 10/09/2014 0846   CALCIUM 10.2 07/03/2017 1145   CALCIUM 9.1 10/09/2014 0846   PROT 7.6 12/28/2016 0958   PROT 7.3 10/09/2014 0846   ALBUMIN 4.0 12/28/2016 0958   ALBUMIN 3.6 10/09/2014 0846   AST 20 12/28/2016 0958   AST 16 10/09/2014 0846   ALT 16 12/28/2016 0958   ALT 13 (L) 10/09/2014 0846   ALKPHOS 79 12/28/2016 0958   ALKPHOS 70 10/09/2014 0846   BILITOT 0.5 12/28/2016 0958   BILITOT 1.0 10/09/2014 0846   GFRNONAA 54 (L) 07/03/2017 1145   GFRNONAA >60 10/09/2014 0846   GFRAA >60 07/03/2017 1145   GFRAA >60 10/09/2014 0846    No results found for: SPEP, UPEP  Lab  Results  Component Value Date   WBC 9.5 07/03/2017   NEUTROABS 6.0 07/03/2017   HGB 12.5 07/03/2017   HCT 37.2 07/03/2017   MCV 84.8 07/03/2017   PLT 278 07/03/2017      Chemistry      Component Value Date/Time   NA 137 07/03/2017 1145   NA 130 (L) 10/09/2014 0846   K 4.0 07/03/2017 1145   K 4.1 10/09/2014 0846   CL 100 (L) 07/03/2017 1145   CL 98 (L) 10/09/2014 0846   CO2 28 07/03/2017 1145   CO2 25 10/09/2014 0846   BUN 22 (H) 07/03/2017 1145   BUN 14 10/09/2014 0846   CREATININE 0.94 07/03/2017 1145   CREATININE 0.70 10/09/2014 0846   GLU 111  03/18/2014 1048      Component Value Date/Time   CALCIUM 10.2 07/03/2017 1145   CALCIUM 9.1 10/09/2014 0846   ALKPHOS 79 12/28/2016 0958   ALKPHOS 70 10/09/2014 0846   AST 20 12/28/2016 0958   AST 16 10/09/2014 0846   ALT 16 12/28/2016 0958   ALT 13 (L) 10/09/2014 0846   BILITOT 0.5 12/28/2016 0958   BILITOT 1.0 10/09/2014 0846        RADIOGRAPHIC STUDIES: I have personally reviewed the radiological images as listed and agreed with the findings in the report. No results found.  IMPRESSION: 1. Chronic post radiation changes in the paramediastinal aspect of the right upper lobe are most compatible with postradiation mass-like fibrosis. No definite findings to suggest local recurrence of disease or metastatic disease in the neck, chest, abdomen or pelvis. 2. Colonic diverticulosis. Although there are no findings to suggest an acute diverticulitis at this time, there is an area of masslike thickening of the proximal sigmoid colon which demonstrates hypermetabolism (SUVmax = 10.0). Although this may simply represent chronic inflammatory changes related to underlying diverticular disease, the possibility of colonic neoplasm should be considered. If there has not been a recent colonoscopy performed, further evaluation with nonemergent colonoscopy is suggested in the near future to exclude colonic neoplasm. 3. Aortic  atherosclerosis, in addition to left main and 3 vessel coronary artery disease. In addition, there is an infrarenal abdominal aortic aneurysm which measures up to 4.1 x 3.6 cm. Recommend followup by ultrasound in 1 year. This recommendation follows ACR consensus guidelines: White Paper of the ACR Incidental Findings Committee II on Vascular Findings. J Am Coll Radiol 2013; 10:789-794. 4. Additional incidental findings, as above.  Aortic Atherosclerosis (ICD10-I70.0).   Electronically Signed   By: Vinnie Langton M.D.   On: 12/27/2016 14:57 ASSESSMENT & PLAN:  Cancer of upper lobe of right lung (HCC) Right upper lobe lung cancer adeno ca- Stage IV [contralateral left lower lobe lung nodule] status post chemoradiation-s/p Nivo currently on surveillance.  # July 16th 2018- Restaging PET scan-stable right upper lobe mass/hilar mass; less than centimeter mediastinal adenopathy-STABLE; insurance currently declines PET scan.  # ? Cough/worsenings SOB- recommend CT chest with contrast ASAP.  Question progression.  #7342 PET scan abnormal colonic uptake;  s/p - coloscopy nov 2018- wnl.    # AAA- stable at 3.7 cm; reviwed the notes fromDr.Schneir recommend follow up-duplex  # Call- [phone]/ pt's daughter- [phone] [facility] daughter]; follow-up to be determined.    Orders Placed This Encounter  Procedures  . CT CHEST W CONTRAST    Standing Status:   Future    Standing Expiration Date:   07/03/2018    Order Specific Question:   If indicated for the ordered procedure, I authorize the administration of contrast media per Radiology protocol    Answer:   Yes    Order Specific Question:   Preferred imaging location?    Answer:   [facility]    Order Specific Question:   Radiology Contrast Protocol - do NOT remove file path    Answer:   [email or web address]    Order Specific Question:   Reason for Exam additional comments    Answer:   cough   All  questions were answered. The patient knows to call the clinic with any problems, questions or concerns.      Cammie Sickle, MD 07/03/2017 4:53 PM

## 2017-07-07 ENCOUNTER — Ambulatory Visit
Admission: RE | Admit: 2017-07-07 | Discharge: 2017-07-07 | Disposition: A | Discharge status: Home / Self Care | Payer: Medicare Other | Source: Ambulatory Visit | Attending: Internal Medicine | Admitting: Internal Medicine

## 2017-07-07 DIAGNOSIS — C3411 Malignant neoplasm of upper lobe, right bronchus or lung: Secondary | ICD-10-CM | POA: Diagnosis present

## 2017-07-07 DIAGNOSIS — Z923 Personal history of irradiation: Secondary | ICD-10-CM | POA: Diagnosis not present

## 2017-07-07 DIAGNOSIS — K838 Other specified diseases of biliary tract: Secondary | ICD-10-CM | POA: Diagnosis not present

## 2017-07-07 DIAGNOSIS — Z85118 Personal history of other malignant neoplasm of bronchus and lung: Secondary | ICD-10-CM | POA: Diagnosis not present

## 2017-07-07 DIAGNOSIS — I700 Atherosclerosis of aorta: Secondary | ICD-10-CM | POA: Diagnosis not present

## 2017-07-07 DIAGNOSIS — Z9889 Other specified postprocedural states: Secondary | ICD-10-CM | POA: Insufficient documentation

## 2017-07-07 DIAGNOSIS — R918 Other nonspecific abnormal finding of lung field: Secondary | ICD-10-CM | POA: Insufficient documentation

## 2017-07-07 DIAGNOSIS — J439 Emphysema, unspecified: Secondary | ICD-10-CM | POA: Insufficient documentation

## 2017-07-07 MED ORDER — IOPAMIDOL (ISOVUE-300) INJECTION 61%
75.0000 mL | Freq: Once | INTRAVENOUS | Status: AC | PRN
  Administered 2017-07-07: 75 mL via INTRAVENOUS

## 2017-07-10 ENCOUNTER — Telehealth: Payer: Self-pay | Admitting: Internal Medicine

## 2017-07-10 NOTE — Telephone Encounter (Signed)
FYI-   Called left message to call us back re: CT results; neg for any cancer; ? Bronchitis. ? Need for anti-biotics  Await pt to call us back.

## 2017-07-11 ENCOUNTER — Other Ambulatory Visit: Payer: Self-pay | Admitting: Internal Medicine

## 2017-07-11 ENCOUNTER — Other Ambulatory Visit: Payer: Self-pay | Admitting: *Deleted

## 2017-07-11 MED ORDER — LEVOFLOXACIN 500 MG PO TABS: 500.0000 mg | ORAL_TABLET | Freq: Every day | ORAL | 0 refills | Status: AC

## 2017-07-11 NOTE — Telephone Encounter (Signed)
Patient informed that Levaquin rx was sent to her pharmacy.

## 2017-07-11 NOTE — Telephone Encounter (Signed)
Patient returned phone call. Results provided. Pt still reports ongoing cough. She requested script for antibiotics to be sent to [facility].

## 2017-07-11 NOTE — Telephone Encounter (Signed)
Per md - Levaquin 500 mg daily x 7 days

## 2017-08-01 ENCOUNTER — Other Ambulatory Visit: Payer: Self-pay | Admitting: Internal Medicine

## 2017-08-29 ENCOUNTER — Other Ambulatory Visit: Payer: Self-pay | Admitting: Internal Medicine

## 2017-08-29 DIAGNOSIS — C3411 Malignant neoplasm of upper lobe, right bronchus or lung: Secondary | ICD-10-CM

## 2017-08-29 NOTE — Progress Notes (Signed)
Heather Cooper, please schedule ct scan

## 2017-08-29 NOTE — Progress Notes (Signed)
Please order a follow-up CT scan in 4 months/prior to next visit; this has been ordered.

## 2017-10-11 ENCOUNTER — Ambulatory Visit: Payer: Medicare Other

## 2017-10-13 ENCOUNTER — Other Ambulatory Visit: Payer: Self-pay | Admitting: Internal Medicine

## 2017-12-08 ENCOUNTER — Other Ambulatory Visit: Payer: Self-pay | Admitting: Internal Medicine

## 2017-12-28 ENCOUNTER — Other Ambulatory Visit: Payer: Self-pay | Admitting: Internal Medicine

## 2017-12-28 ENCOUNTER — Ambulatory Visit
Admission: RE | Admit: 2017-12-28 | Discharge: 2017-12-28 | Disposition: A | Discharge status: Home / Self Care | Payer: Medicare Other | Source: Ambulatory Visit | Attending: Internal Medicine | Admitting: Internal Medicine

## 2017-12-28 DIAGNOSIS — J439 Emphysema, unspecified: Secondary | ICD-10-CM | POA: Insufficient documentation

## 2017-12-28 DIAGNOSIS — C3411 Malignant neoplasm of upper lobe, right bronchus or lung: Secondary | ICD-10-CM

## 2017-12-28 DIAGNOSIS — K838 Other specified diseases of biliary tract: Secondary | ICD-10-CM | POA: Insufficient documentation

## 2017-12-28 DIAGNOSIS — I700 Atherosclerosis of aorta: Secondary | ICD-10-CM | POA: Diagnosis not present

## 2017-12-28 LAB — POCT I-STAT CREATININE: CREATININE: 1 mg/dL (ref 0.44–1.00)

## 2017-12-28 MED ORDER — IOPAMIDOL (ISOVUE-300) INJECTION 61%
75.0000 mL | Freq: Once | INTRAVENOUS | Status: AC | PRN
  Administered 2017-12-28: 75 mL via INTRAVENOUS

## 2018-01-01 ENCOUNTER — Other Ambulatory Visit: Payer: Self-pay

## 2018-01-01 ENCOUNTER — Inpatient Hospital Stay: Payer: Medicare Other | Attending: Oncology | Admitting: Oncology

## 2018-01-01 ENCOUNTER — Inpatient Hospital Stay: Payer: Medicare Other

## 2018-01-01 ENCOUNTER — Encounter: Payer: Self-pay | Admitting: Oncology

## 2018-01-01 DIAGNOSIS — Z85118 Personal history of other malignant neoplasm of bronchus and lung: Secondary | ICD-10-CM | POA: Insufficient documentation

## 2018-01-01 DIAGNOSIS — C3411 Malignant neoplasm of upper lobe, right bronchus or lung: Secondary | ICD-10-CM

## 2018-01-01 DIAGNOSIS — I10 Essential (primary) hypertension: Secondary | ICD-10-CM | POA: Diagnosis not present

## 2018-01-01 DIAGNOSIS — Z923 Personal history of irradiation: Secondary | ICD-10-CM | POA: Insufficient documentation

## 2018-01-01 DIAGNOSIS — Z87891 Personal history of nicotine dependence: Secondary | ICD-10-CM | POA: Insufficient documentation

## 2018-01-01 DIAGNOSIS — Z9221 Personal history of antineoplastic chemotherapy: Secondary | ICD-10-CM | POA: Insufficient documentation

## 2018-01-01 LAB — COMPREHENSIVE METABOLIC PANEL
ALBUMIN: 4.2 g/dL (ref 3.5–5.0)
ALT: 16 U/L (ref 0–44)
ANION GAP: 11 (ref 5–15)
AST: 21 U/L (ref 15–41)
Alkaline Phosphatase: 81 U/L (ref 38–126)
BUN: 25 mg/dL — AB (ref 8–23)
CHLORIDE: 102 mmol/L (ref 98–111)
CO2: 25 mmol/L (ref 22–32)
Calcium: 9.6 mg/dL (ref 8.9–10.3)
Creatinine, Ser: 0.91 mg/dL (ref 0.44–1.00)
GFR calc Af Amer: >60 mL/min (ref >60)
GFR calc non Af Amer: 56 mL/min — ABNORMAL LOW (ref >60)
GLUCOSE: 92 mg/dL (ref 70–99)
Potassium: 3.6 mmol/L (ref 3.5–5.1)
Sodium: 138 mmol/L (ref 135–145)
Total Bilirubin: 0.3 mg/dL (ref 0.3–1.2)
Total Protein: 8 g/dL (ref 6.5–8.1)

## 2018-01-01 LAB — CBC WITH DIFFERENTIAL/PLATELET
BASOS ABS: 0.1 10*3/uL (ref 0–0.1)
Basophils Relative: 1 %
EOS PCT: 2 %
Eosinophils Absolute: 0.2 10*3/uL (ref 0–0.7)
HEMATOCRIT: 36.4 % (ref 35.0–47.0)
Hemoglobin: 12.3 g/dL (ref 12.0–16.0)
LYMPHS ABS: 2.2 10*3/uL (ref 1.0–3.6)
LYMPHS PCT: 28 %
MCH: 28.4 pg (ref 26.0–34.0)
MCHC: 33.9 g/dL (ref 32.0–36.0)
MCV: 83.8 fL (ref 80.0–100.0)
MONO ABS: 0.5 10*3/uL (ref 0.2–0.9)
Monocytes Relative: 7 %
NEUTROS ABS: 5 10*3/uL (ref 1.4–6.5)
Neutrophils Relative %: 62 %
Platelets: 284 10*3/uL (ref 150–440)
RBC: 4.35 MIL/uL (ref 3.80–5.20)
RDW: 14.4 % (ref 11.5–14.5)
WBC: 7.9 10*3/uL (ref 3.6–11.0)

## 2018-01-01 NOTE — Progress Notes (Signed)
[place] OFFICE PROGRESS NOTE  Patient Care Team: [name], MD as PCP - General (Internal Medicine)  Cancer Staging No matching staging information was found for the patient.   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  Starting radiation and chemotherapy from April 14, 2014 Patient was started on carboplatinum andTaxol Herve were developed an allergic reaction to Taxol so would be changed to Abraxane 3.patient has finished 6 cycles of weekly chemotherapy with carboplatinum  and radiation therapy(May 28, 2014) 4.started on  NIVOLULAMAB because of persistent disease July 02, 2014. 5.  NIVOLULAMAB was discontinued because of persistent diarrhea in July of 2016.  August of 2016 CT scan was stable so no further chemotherapy  # AUG 25th PET- STABLE RUL MASS [radiation fibrosis; <1cm ? Mediastinal recurrence]; repeat PET in 4 m  # AAA/ 3.3x 3.9 Stable [Dr.Schnier]     Cancer of upper lobe of right lung ([place])     INTERVAL HISTORY: Patient presents today with above history of right upper lobe adenocarcinoma stage IV lung cancer currently on surveillance is here for follow-up.  She is accompanied by her son-in-law.  In the interim patient has been doing well.  She denies any further shortness of breath.  has intermittent coughing spells at night.  She denies hemoptysis.  She denies nausea, vomiting, headaches, tingling and numbness or changes in appetite.  Her weight remains stable.    REVIEW OF SYSTEMS:   Review of Systems  Constitutional: Negative.  Negative for chills, fever, malaise/fatigue and weight  loss.  HENT: Negative for congestion and ear pain.   Eyes: Negative.  Negative for blurred vision and double vision.  Respiratory: Positive for cough (At night). Negative for sputum production and shortness of breath.   Cardiovascular: Negative.  Negative for chest pain, palpitations and leg swelling.  Gastrointestinal: Negative.  Negative for abdominal pain, constipation, diarrhea, nausea and vomiting.  Genitourinary: Negative for dysuria, frequency and urgency.  Musculoskeletal: Negative for back pain and falls.  Skin: Negative.  Negative for rash.  Neurological: Negative.  Negative for weakness and headaches.  Endo/Heme/Allergies: Negative.  Does not bruise/bleed easily.  Psychiatric/Behavioral: Negative.  Negative for depression. The patient is not nervous/anxious and does not have insomnia.      PAST MEDICAL HISTORY :  Past Medical History:  Diagnosis Date  . Chicken pox   . Hypercalcemia    familial hypocalciuric hypercalcemia  . Hypercholesterolemia   . Hypertension   . Lung cancer ([place])   . Osteoporosis   . Thyroid disease     PAST SURGICAL HISTORY :   Past Surgical History:  Procedure Laterality Date  . ABDOMINAL HYSTERECTOMY     partial  . CHOLECYSTECTOMY    . COLONOSCOPY WITH PROPOFOL N/A 04/17/2017   Procedure: COLONOSCOPY WITH PROPOFOL;  Surgeon: Manya Silvas, MD;  Location: [facility] ENDOSCOPY;  Service: Endoscopy;  Laterality: N/A;  . transvaginal hysterectomy  04/18/05   with anterior colporrhaphy    FAMILY HISTORY :   Family History  Problem Relation Age of Onset  . Stroke Mother   . Hypertension Mother   . Prostate cancer Father   .  Cancer Father        prostate  . Heart disease Brother        s/p CABG  . Colon cancer Neg Hx     SOCIAL HISTORY:   Social History   Tobacco Use  . Smoking status: Former Smoker    Last attempt to quit: 06/13/1997    Years since quitting: 20.5  . Smokeless tobacco: Never Used  Substance Use Topics  . Alcohol use:  No    Alcohol/week: 0.0 oz  . Drug use: No    ALLERGIES:  is allergic to paclitaxel.  MEDICATIONS:  Current Outpatient Medications  Medication Sig Dispense Refill  . olmesartan-hydrochlorothiazide (BENICAR HCT) 20-12.5 MG tablet TAKE 1 TABLET BY MOUTH ONCE DAILY 90 tablet 1  . pantoprazole (PROTONIX) 40 MG tablet TAKE 1 TABLET BY MOUTH ONCE DAILY 30 tablet 1   No current facility-administered medications for this visit.    Facility-Administered Medications Ordered in Other Visits  Medication Dose Route Frequency Provider Last Rate Last Dose  . sodium chloride 0.9 % 1,000 mL with potassium chloride 20 mEq, magnesium sulfate 2 g infusion   Intravenous Continuous Forest Gleason, MD   Stopped at 12/04/14 1300    PHYSICAL EXAMINATION: ECOG PERFORMANCE STATUS: 0 - Asymptomatic  BP (!) 142/76 (Patient Position: Sitting)   Pulse 74   Temp (!) 97 F (36.1 C) (Tympanic)   Resp 18   Ht 5' 4"  (1.626 m)   Wt 147 lb (66.7 kg)   BMI 25.23 kg/m   Filed Weights   01/01/18 1301  Weight: 147 lb (66.7 kg)    .Physical Exam  Constitutional: She is oriented to person, place, and time and well-developed, well-nourished, and in no distress. Vital signs are normal.  HENT:  Head: Normocephalic and atraumatic.  Eyes: Pupils are equal, round, and reactive to light.  Neck: Normal range of motion.  Cardiovascular: Normal rate, regular rhythm and normal heart sounds.  No murmur heard. Pulmonary/Chest: Effort normal and breath sounds normal. She has no wheezes.  Abdominal: Soft. Normal appearance and bowel sounds are normal. She exhibits no distension. There is no tenderness.  Musculoskeletal: Normal range of motion. She exhibits no edema.  Neurological: She is alert and oriented to person, place, and time. Gait normal.  Skin: Skin is warm and dry. No rash noted.  Psychiatric: Mood, memory, affect and judgment normal.    LABORATORY DATA:  I have reviewed the data as listed    Component Value  Date/Time   NA 138 01/01/2018 1239   NA 130 (L) 10/09/2014 0846   K 3.6 01/01/2018 1239   K 4.1 10/09/2014 0846   CL 102 01/01/2018 1239   CL 98 (L) 10/09/2014 0846   CO2 25 01/01/2018 1239   CO2 25 10/09/2014 0846   GLUCOSE 92 01/01/2018 1239   GLUCOSE 161 (H) 10/09/2014 0846   BUN 25 (H) 01/01/2018 1239   BUN 14 10/09/2014 0846   CREATININE 0.91 01/01/2018 1239   CREATININE 0.70 10/09/2014 0846   CALCIUM 9.6 01/01/2018 1239   CALCIUM 9.1 10/09/2014 0846   PROT 8.0 01/01/2018 1239   PROT 7.3 10/09/2014 0846   ALBUMIN 4.2 01/01/2018 1239   ALBUMIN 3.6 10/09/2014 0846   AST 21 01/01/2018 1239   AST 16 10/09/2014 0846   ALT 16 01/01/2018 1239   ALT 13 (L) 10/09/2014 0846   ALKPHOS 81 01/01/2018 1239   ALKPHOS 70 10/09/2014 0846   BILITOT 0.3 01/01/2018 1239   BILITOT 1.0 10/09/2014  0846   GFRNONAA 56 (L) 01/01/2018 1239   GFRNONAA >60 10/09/2014 0846   GFRAA >60 01/01/2018 1239   GFRAA >60 10/09/2014 0846    No results found for: SPEP, UPEP  Lab Results  Component Value Date   WBC 7.9 01/01/2018   NEUTROABS 5.0 01/01/2018   HGB 12.3 01/01/2018   HCT 36.4 01/01/2018   MCV 83.8 01/01/2018   PLT 284 01/01/2018      Chemistry      Component Value Date/Time   NA 138 01/01/2018 1239   NA 130 (L) 10/09/2014 0846   K 3.6 01/01/2018 1239   K 4.1 10/09/2014 0846   CL 102 01/01/2018 1239   CL 98 (L) 10/09/2014 0846   CO2 25 01/01/2018 1239   CO2 25 10/09/2014 0846   BUN 25 (H) 01/01/2018 1239   BUN 14 10/09/2014 0846   CREATININE 0.91 01/01/2018 1239   CREATININE 0.70 10/09/2014 0846   GLU 111 03/18/2014 1048      Component Value Date/Time   CALCIUM 9.6 01/01/2018 1239   CALCIUM 9.1 10/09/2014 0846   ALKPHOS 81 01/01/2018 1239   ALKPHOS 70 10/09/2014 0846   AST 21 01/01/2018 1239   AST 16 10/09/2014 0846   ALT 16 01/01/2018 1239   ALT 13 (L) 10/09/2014 0846   BILITOT 0.3 01/01/2018 1239   BILITOT 1.0 10/09/2014 0846        RADIOGRAPHIC STUDIES: I  have personally reviewed the radiological images as listed and agreed with the findings in the report. No results found.  IMPRESSION: 1. Chronic post radiation changes in the paramediastinal aspect of the right upper lobe are most compatible with postradiation mass-like fibrosis. No definite findings to suggest local recurrence of disease or metastatic disease in the neck, chest, abdomen or pelvis. 2. Colonic diverticulosis. Although there are no findings to suggest an acute diverticulitis at this time, there is an area of masslike thickening of the proximal sigmoid colon which demonstrates hypermetabolism (SUVmax = 10.0). Although this may simply represent chronic inflammatory changes related to underlying diverticular disease, the possibility of colonic neoplasm should be considered. If there has not been a recent colonoscopy performed, further evaluation with nonemergent colonoscopy is suggested in the near future to exclude colonic neoplasm. 3. Aortic atherosclerosis, in addition to left main and 3 vessel coronary artery disease. In addition, there is an infrarenal abdominal aortic aneurysm which measures up to 4.1 x 3.6 cm. Recommend followup by ultrasound in 1 year. This recommendation follows ACR consensus guidelines: White Paper of the ACR Incidental Findings Committee II on Vascular Findings. J Am Coll Radiol 2013; 10:789-794. 4. Additional incidental findings, as above.  Aortic Atherosclerosis (ICD10-I70.0).   Electronically Signed   By: Vinnie Langton M.D.   On: 12/27/2016 14:57 ASSESSMENT & PLAN:  Cancer of upper lobe of right lung (HCC) Right upper lobe lung cancer adeno ca- Stage IV [contralateral left lower lobe lung nodule] status post chemoradiation-s/p Nivo currently on surveillance.  Had Repeat CT chest with contrast (July 2019) revealing stable post radiation changes paramediastinal right upper lobe. No evidence to suggest localized recurrence or  metastatic disease.Slight interval improvement in previously described right lower lobe peripheral nodularity, likely improving infectious/inflammatory process.  Labs today are stable.   RTC in 6 months with labs, CT chest with contrast and MD assessment.      Orders Placed This Encounter  Procedures  . CT Chest W Contrast    Standing Status:   Future    Standing  Expiration Date:   01/01/2019    Order Specific Question:   ** REASON FOR EXAM (FREE TEXT)    Answer:   hx of lung cancer    Order Specific Question:   If indicated for the ordered procedure, I authorize the administration of contrast media per Radiology protocol    Answer:   Yes    Order Specific Question:   Preferred imaging location?    Answer:   [facility]    Order Specific Question:   Radiology Contrast Protocol - do NOT remove file path    Answer:   [email or web address]   All questions were answered. The patient knows to call the clinic with any problems, questions or concerns.      Jacquelin Hawking, NP 01/01/2018 2:01 PM

## 2018-01-01 NOTE — Assessment & Plan Note (Addendum)
Right upper lobe lung cancer adeno ca- Stage IV [contralateral left lower lobe lung nodule] status post chemoradiation-s/p Nivo currently on surveillance.  Had Repeat CT chest with contrast (July 2019) revealing stable post radiation changes paramediastinal right upper lobe. No evidence to suggest localized recurrence or metastatic disease.Slight interval improvement in previously described right lower lobe peripheral nodularity, likely improving infectious/inflammatory process.  Labs today are stable.   RTC in 6 months with labs, CT chest with contrast and MD assessment.

## 2018-01-03 ENCOUNTER — Ambulatory Visit (INDEPENDENT_AMBULATORY_CARE_PROVIDER_SITE_OTHER): Payer: Medicare Other

## 2018-01-03 VITALS — BP 122/62 | HR 70 | Temp 97.7°F | Resp 14 | Ht 63.5 in | Wt 146.0 lb

## 2018-01-03 DIAGNOSIS — Z0000 Encounter for general adult medical examination without abnormal findings: Secondary | ICD-10-CM

## 2018-01-03 NOTE — Progress Notes (Addendum)
Subjective:   Heather Cooper is a 84 y.o. female who presents for Medicare Annual (Subsequent) preventive examination.  Review of Systems:  No ROS.  Medicare Wellness Visit. Additional risk factors are reflected in the social history. Cardiac Risk Factors include: advanced age (>26men, >56 women);hypertension     Objective:     Vitals: BP 122/62 (BP Location: Left Arm, Patient Position: Sitting, Cuff Size: Normal)   Pulse 70   Temp 97.7 F (36.5 C) (Oral)   Resp 14   Ht 5' 3.5" (1.613 m)   Wt 146 lb (66.2 kg)   SpO2 99%   BMI 25.46 kg/m   Body mass index is 25.46 kg/m.  Advanced Directives 01/03/2018 01/01/2018 04/17/2017 04/04/2017 12/28/2016 10/10/2016 06/16/2016  Does Patient Have a Medical Advance Directive? Yes Yes Yes Yes Yes Yes Yes  Type of Advance Directive Living will [place];Living will Living will Living will [place];Living will [place];Living will Living will;Healthcare Power of Attorney  Does patient want to make changes to medical advance directive? No - Patient declined No - Patient declined - - - No - Patient declined -  Copy of [place] in Chart? No - copy requested No - copy requested - - - No - copy requested -  Would patient like information on creating a medical advance directive? - - No - Patient declined No - Patient declined - - -    Tobacco Social History   Tobacco Use  Smoking Status Former Smoker  . Last attempt to quit: 06/13/1997  . Years since quitting: 20.5  Smokeless Tobacco Never Used     Counseling given: Not Answered   Clinical Intake:  Pre-visit preparation completed: Yes  Pain : No/denies pain     Nutritional Status: BMI 25 -29 Overweight Diabetes: No  How often do you need to have someone help you when you read instructions, pamphlets, or other written materials from your doctor or pharmacy?: 1 - Never  Interpreter Needed?: No     Past  Medical History:  Diagnosis Date  . Chicken pox   . Hypercalcemia    familial hypocalciuric hypercalcemia  . Hypercholesterolemia   . Hypertension   . Lung cancer ([place])   . Osteoporosis   . Thyroid disease    Past Surgical History:  Procedure Laterality Date  . ABDOMINAL HYSTERECTOMY     partial  . CHOLECYSTECTOMY    . COLONOSCOPY WITH PROPOFOL N/A 04/17/2017   Procedure: COLONOSCOPY WITH PROPOFOL;  Surgeon: Manya Silvas, MD;  Location: [facility] END[facility]COPY;  Service: Endoscopy;  Laterality: N/A;  . transvaginal hysterectomy  04/18/05   with anterior colporrhaphy   Family History  Problem Relation Age of Onset  . Stroke Mother   . Hypertension Mother   . Prostate cancer Father   . Cancer Father        prostate  . Heart disease Brother        s/p CABG  . Colon cancer Neg Hx    Social History   Socioeconomic History  . Marital status: Married    Spouse name: Not on file  . Number of children: 3  . Years of education: Not on file  . Highest education level: Not on file  Occupational History  . Not on file  Social Needs  . Financial resource strain: Not hard at all  . Food insecurity:    Worry: Never true    Inability: Never true  .  Transportation needs:    Medical: No    Non-medical: No  Tobacco Use  . Smoking status: Former Smoker    Last attempt to quit: 06/13/1997    Years since quitting: 20.5  . Smokeless tobacco: Never Used  Substance and Sexual Activity  . Alcohol use: No    Alcohol/week: 0.0 oz  . Drug use: No  . Sexual activity: Not on file  Lifestyle  . Physical activity:    Days per week: 0 days    Minutes per session: Not on file  . Stress: Not on file  Relationships  . Social connections:    Talks on phone: Not on file    Gets together: Not on file    Attends religious service: Not on file    Active member of club or organization: Not on file    Attends meetings of clubs or organizations: Not on file    Relationship status: Not on file    Other Topics Concern  . Not on file  Social History Narrative  . Not on file    Outpatient Encounter Medications as of 01/03/2018  Medication Sig  . olmesartan-hydrochlorothiazide (BENICAR HCT) 20-12.5 MG tablet TAKE 1 TABLET BY MOUTH ONCE DAILY  . pantoprazole (PROTONIX) 40 MG tablet TAKE 1 TABLET BY MOUTH ONCE DAILY   Facility-Administered Encounter Medications as of 01/03/2018  Medication  . sodium chloride 0.9 % 1,000 mL with potassium chloride 20 mEq, magnesium sulfate 2 g infusion    Activities of Daily Living In your present state of health, do you have any difficulty performing the following activities: 01/03/2018  Hearing? N  Vision? N  Difficulty concentrating or making decisions? N  Walking or climbing stairs? Y  Comment Unsteady gait.   Dressing or bathing? N  Doing errands, shopping? Y  Comment She does not drive.  Preparing Food and eating ? N  Using the Toilet? N  In the past six months, have you accidently leaked urine? N  Do you have problems with loss of bowel control? N  Managing your Medications? N  Managing your Finances? N  Housekeeping or managing your Housekeeping? N  Some recent data might be hidden    Patient Care Team: [name], MD as PCP - General (Internal Medicine)    Assessment:   This is a routine wellness examination for [place].  The goal of the wellness visit is to assist the patient how to close the gaps in care and create a preventative care plan for the patient.   The roster of all physicians providing medical care to patient is listed in the Snapshot section of the chart.  Osteoporosis risk reviewed.    Safety issues reviewed; Smoke and carbon monoxide detectors in the home. No firearms in the home. Wears seatbelts when riding with others. No violence in the home.  They do not have excessive sun exposure.  Discussed the need for sun protection: hats, long sleeves and the use of sunscreen if there is significant sun  exposure.  Patient is alert, normal appearance, oriented to person/place/and time. Correctly identified the president of the [place] and recalls of 3/3 words.Performs simple calculations and can read correct time from watch face. Displays appropriate judgement.  No new identified risk were noted.  No failures at ADL's or IADL's.  Ambulates with cane.   BMI- discussed the importance of a healthy diet, water intake and the benefits of aerobic exercise. Educational material provided.   24 hour diet recall: Regular diet.  Dental-  dentures.  Sleep patterns- Sleeps without issues.   Mammogram discussed. Aged out.   TDAP and Pneumococcal vaccine deferred per patient preference to follow up with pcp.  Patient Concerns: None at this time. Follow up with PCP as needed.  Exercise Activities and Dietary recommendations Current Exercise Habits: Home exercise routine, Type of exercise: walking, Time (Minutes): 10, Frequency (Times/Week): 1, Weekly Exercise (Minutes/Week): 10, Intensity: Mild  Goals    . Maintain Healthy Lifestyle     Brain engagement activity (read the newspaper/book, word search etc..) Stay active Stay hydrated Healthy diet        Fall Risk Fall Risk  01/03/2018 10/10/2016 04/14/2016 10/11/2013 10/07/2012  Falls in the past year? No No No No No    Depression Screen PHQ 2/9 Scores 01/03/2018 10/10/2016 04/14/2016 10/11/2013  PHQ - 2 Score 0 0 0 0  PHQ- 9 Score - 0 - -     Cognitive Function     6CIT Screen 01/03/2018 10/10/2016  What Year? 0 points 0 points  What month? 0 points 0 points  What time? 0 points 0 points  Count back from 20 0 points 0 points  Months in reverse 0 points 0 points  Repeat phrase 0 points 0 points  Total Score 0 0    Immunization History  Administered Date(s) Administered  . Influenza Split 03/24/2014  . Influenza, High Dose Seasonal PF 03/21/2016, 02/22/2017  . Influenza,inj,Quad PF,6+ Mos 03/02/2015  . Influenza-Unspecified 03/14/2012    . Pneumococcal Polysaccharide-23 06/26/2012    Screening Tests Health Maintenance  Topic Date Due  . TETANUS/TDAP  12/30/1949  . PNA vac Low Risk Adult (2 of 2 - PCV13) 06/26/2013  . INFLUENZA VACCINE  01/11/2018  . DEXA SCAN  Completed  . MAMMOGRAM  Discontinued      Plan:   End of life planning; Advance aging; Advanced directives discussed. Copy of current HCPOA/Living Will requested.    I have personally reviewed and noted the following in the patient's chart:   . Medical and social history . Use of alcohol, tobacco or illicit drugs  . Current medications and supplements . Functional ability and status . Nutritional status . Physical activity . Advanced directives . List of other physicians . Hospitalizations, surgeries, and ER visits in previous 12 months . Vitals . Screenings to include cognitive, depression, and falls . Referrals and appointments  In addition, I have reviewed and discussed with patient certain preventive protocols, quality metrics, and best practice recommendations. A written personalized care plan for preventive services as well as general preventive health recommendations were provided to patient.     Varney Biles, LPN  [id number]/62/[id number]   Reviewed above information.  Agree with assessment and plan.    Dr Nicki Reaper

## 2018-01-03 NOTE — Patient Instructions (Addendum)
  Heather Cooper , Thank you for taking time to come for your Medicare Wellness Visit. I appreciate your ongoing commitment to your health goals. Please review the following plan we discussed and let me know if I can assist you in the future.   Follow up as needed.    Bring a copy of your [place] and/or Living Will to be scanned into chart.  Have a great day!  These are the goals we discussed: Goals    . Maintain Healthy Lifestyle     Brain engagement activity (read the newspaper/book, word search etc..) Stay active Stay hydrated Healthy diet        This is a list of the screening recommended for you and due dates:  Health Maintenance  Topic Date Due  . Tetanus Vaccine  12/30/1949  . Pneumonia vaccines (2 of 2 - PCV13) 06/26/2013  . Flu Shot  01/11/2018  . DEXA scan (bone density measurement)  Completed  . Mammogram  Discontinued

## 2018-01-15 ENCOUNTER — Ambulatory Visit (INDEPENDENT_AMBULATORY_CARE_PROVIDER_SITE_OTHER): Payer: Medicare Other | Admitting: Vascular Surgery

## 2018-01-15 ENCOUNTER — Encounter (INDEPENDENT_AMBULATORY_CARE_PROVIDER_SITE_OTHER): Payer: Self-pay | Admitting: Vascular Surgery

## 2018-01-15 ENCOUNTER — Ambulatory Visit (INDEPENDENT_AMBULATORY_CARE_PROVIDER_SITE_OTHER): Payer: Medicare Other

## 2018-01-15 VITALS — BP 123/71 | HR 75 | Resp 15 | Ht 63.0 in | Wt 144.0 lb

## 2018-01-15 DIAGNOSIS — E7800 Pure hypercholesterolemia, unspecified: Secondary | ICD-10-CM | POA: Diagnosis not present

## 2018-01-15 DIAGNOSIS — I714 Abdominal aortic aneurysm, without rupture, unspecified: Secondary | ICD-10-CM

## 2018-01-15 DIAGNOSIS — I10 Essential (primary) hypertension: Secondary | ICD-10-CM | POA: Diagnosis not present

## 2018-01-15 DIAGNOSIS — K219 Gastro-esophageal reflux disease without esophagitis: Secondary | ICD-10-CM

## 2018-01-17 ENCOUNTER — Encounter (INDEPENDENT_AMBULATORY_CARE_PROVIDER_SITE_OTHER): Payer: Self-pay | Admitting: Vascular Surgery

## 2018-01-17 NOTE — Progress Notes (Signed)
MRN : [id number]  Heather Cooper is a 84 y.o. (Nov 01, 1930) female who presents with chief complaint of  Chief Complaint  Patient presents with  . AAA    1 year follow up  .  History of Present Illness:   The patient returns to the office for surveillance of a known abdominal aortic aneurysm. Patient denies abdominal pain or back pain, no other abdominal complaints. No changes suggesting embolic episodes.   There have been no interval changes in the patient's overall health care since his last visit.  Patient denies amaurosis fugax or TIA symptoms. There is no history of claudication or rest pain symptoms of the lower extremities. The patient denies angina or shortness of breath.   Duplex US of the aorta and iliac arteries shows an AAA measured 3.32 cm.  No significant change compared to last year  No outpatient medications have been marked as taking for the 01/15/18 encounter (Office Visit) with Delana Meyer, Dolores Lory, MD.    Past Medical History:  Diagnosis Date  . Chicken pox   . Hypercalcemia    familial hypocalciuric hypercalcemia  . Hypercholesterolemia   . Hypertension   . Lung cancer ([place])   . Osteoporosis   . Thyroid disease     Past Surgical History:  Procedure Laterality Date  . ABDOMINAL HYSTERECTOMY     partial  . CHOLECYSTECTOMY    . COLONOSCOPY WITH PROPOFOL N/A 04/17/2017   Procedure: COLONOSCOPY WITH PROPOFOL;  Surgeon: Manya Silvas, MD;  Location: [facility] ENDOSCOPY;  Service: Endoscopy;  Laterality: N/A;  . transvaginal hysterectomy  04/18/05   with anterior colporrhaphy    Social History Social History   Tobacco Use  . Smoking status: Former Smoker    Last attempt to quit: 06/13/1997    Years since quitting: 20.6  . Smokeless tobacco: Never Used  Substance Use Topics  . Alcohol use: No    Alcohol/week: 0.0 oz  . Drug use: No    Family History Family History  Problem Relation Age of Onset  . Stroke Mother   . Hypertension Mother   .  Prostate cancer Father   . Cancer Father        prostate  . Heart disease Brother        s/p CABG  . Colon cancer Neg Hx     Allergies  Allergen Reactions  . Paclitaxel Other (See Comments)    Chest tightness     REVIEW OF SYSTEMS (Negative unless checked)  Constitutional: [] Weight loss  [] Fever  [] Chills Cardiac: [] Chest pain   [] Chest pressure   [] Palpitations   [] Shortness of breath when laying flat   [] Shortness of breath with exertion. Vascular:  [] Pain in legs with walking   [] Pain in legs at rest  [] History of DVT   [] Phlebitis   [x] Swelling in legs   [] Varicose veins   [] Non-healing ulcers Pulmonary:   [] Uses home oxygen   [] Productive cough   [] Hemoptysis   [] Wheeze  [] COPD   [] Asthma Neurologic:  [] Dizziness   [] Seizures   [] History of stroke   [] History of TIA  [] Aphasia   [] Vissual changes   [] Weakness or numbness in arm   [] Weakness or numbness in leg Musculoskeletal:   [] Joint swelling   [x] Joint pain   [x] Low back pain Hematologic:  [] Easy bruising  [] Easy bleeding   [] Hypercoagulable state   [] Anemic Gastrointestinal:  [] Diarrhea   [] Vomiting  [] Gastroesophageal reflux/heartburn   [] Difficulty swallowing. Genitourinary:  [] Chronic kidney disease   []   Difficult urination  [] Frequent urination   [] Blood in urine Skin:  [] Rashes   [] Ulcers  Psychological:  [] History of anxiety   []  History of major depression.  Physical Examination  Vitals:   01/15/18 1000  BP: 123/71  Pulse: 75  Resp: 15  Weight: 144 lb (65.3 kg)  Height: 5\' 3"  (1.6 m)   Body mass index is 25.51 kg/m. Gen: WD/WN, NAD Head: /AT, No temporalis wasting.  Ear/Nose/Throat: Hearing grossly intact, nares w/o erythema or drainage Eyes: PER, EOMI, sclera nonicteric.  Neck: Supple, no large masses.   Pulmonary:  Good air movement, no audible wheezing bilaterally, no use of accessory muscles.  Cardiac: RRR, no JVD Vascular: scattered varicosities present bilaterally.  Mild venous stasis changes to  the legs bilaterally.  2+ soft pitting edema.  Popliteal pulses are not enlarged Vessel Right Left  Radial Palpable Palpable  Popliteal Palpable Palpable  PT Palpable Palpable  DP Palpable Palpable  Gastrointestinal: Non-distended. No guarding/no peritoneal signs.  Musculoskeletal: M/S 5/5 throughout.  No deformity or atrophy.  Neurologic: CN 2-12 intact. Symmetrical.  Speech is fluent. Motor exam as listed above. Psychiatric: Judgment intact, Mood & affect appropriate for pt's clinical situation. Dermatologic: No rashes or ulcers noted.  No changes consistent with cellulitis. Lymph : No lichenification or skin changes of chronic lymphedema.  CBC Lab Results  Component Value Date   WBC 7.9 01/01/2018   HGB 12.3 01/01/2018   HCT 36.4 01/01/2018   MCV 83.8 01/01/2018   PLT 284 01/01/2018    BMET    Component Value Date/Time   NA 138 01/01/2018 1239   NA 130 (L) 10/09/2014 0846   K 3.6 01/01/2018 1239   K 4.1 10/09/2014 0846   CL 102 01/01/2018 1239   CL 98 (L) 10/09/2014 0846   CO2 25 01/01/2018 1239   CO2 25 10/09/2014 0846   GLUCOSE 92 01/01/2018 1239   GLUCOSE 161 (H) 10/09/2014 0846   BUN 25 (H) 01/01/2018 1239   BUN 14 10/09/2014 0846   CREATININE 0.91 01/01/2018 1239   CREATININE 0.70 10/09/2014 0846   CALCIUM 9.6 01/01/2018 1239   CALCIUM 9.1 10/09/2014 0846   GFRNONAA 56 (L) 01/01/2018 1239   GFRNONAA >60 10/09/2014 0846   GFRAA >60 01/01/2018 1239   GFRAA >60 10/09/2014 0846   Estimated Creatinine Clearance: 41.1 mL/min (by C-G formula based on SCr of 0.91 mg/dL).  COAG Lab Results  Component Value Date   INR 1.0 03/18/2014   INR 1.0 03/18/2014   PROTIME 13.2 03/18/2014    Radiology Ct Chest W Contrast  Result Date: 12/28/2017 CLINICAL DATA:  Patient with history of right upper lobe lung cancer. Restaging exam. EXAM: CT CHEST WITH CONTRAST TECHNIQUE: Multidetector CT imaging of the chest was performed during intravenous contrast administration.  CONTRAST:  67mL ISOVUE-300 IOPAMIDOL (ISOVUE-300) INJECTION 61% COMPARISON:  Chest CT 07/07/2017 FINDINGS: Cardiovascular: Normal heart size. Trace fluid superior pericardial recess. Coronary arterial vascular calcifications. Thoracic aortic vascular calcifications. Mediastinum/Nodes: Similar-appearing 8 mm precarinal lymph node (image 55; series 2). No enlarged axillary adenopathy appearing present no enlarged axillary adenopathy. Lungs/Pleura: Central airways are patent. Dependent atelectasis within the bilateral lower lobes. Centrilobular and paraseptal emphysematous change. Similar-appearing masslike architectural distortion with paramediastinal right upper lobe (image 50; series 3). Grossly unchanged 1.6 cm nodule within the area post radiation change (image 45; series 2). No new or enlarging pulmonary nodules. Slight interval improvement in previously described patchy nodularity within the peripheral right lower lobe (image 88; series 3).  No pleural effusion or pneumothorax. Upper Abdomen: Status post cholecystectomy. Unchanged biliary ductal dilatation. Heterogeneous enhancement of the spleen, likely secondary to phase of contrast. No acute process. Musculoskeletal: Thoracic spine degenerative changes. No aggressive or acute appearing osseous lesions. IMPRESSION: 1. Stable post radiation changes paramediastinal right upper lobe. No evidence to suggest localized recurrence or metastatic disease. 2. Slight interval improvement in previously described right lower lobe peripheral nodularity, likely improving infectious/inflammatory process. 3. Unchanged dilatation of the common bile duct. 4. Aortic Atherosclerosis (ICD10-I70.0) and Emphysema (ICD10-J43.9). Electronically Signed   By: Lovey Newcomer M.D.   On: 12/28/2017 14:14     Assessment/Plan 1. Abdominal aortic aneurysm (AAA) without rupture ([address]) No surgery or intervention at this time. The patient has an asymptomatic abdominal aortic aneurysm that is  less than 4 cm in maximal diameter.  I have discussed the natural history of abdominal aortic aneurysm and the small risk of rupture for aneurysm less than 5 cm in size.  However, as these small aneurysms tend to enlarge over time, continued surveillance with ultrasound or CT scan is mandatory.  I have also discussed optimizing medical management with hypertension and lipid control and the importance of abstinence from tobacco.  The patient is also encouraged to exercise a minimum of 30 minutes 4 times a week.   Should the patient develop new onset abdominal or back pain or signs of peripheral embolization they are instructed to seek medical attention immediately and to alert the physician providing care that they have an aneurysm.   The patient voices their understanding.  The patient will return in 24 months with an aortic duplex.  - VAS US AORTA/IVC/ILIACS; Future  2. Essential hypertension Continue antihypertensive medications as already ordered, these medications have been reviewed and there are no changes at this time.   3. Hypercholesterolemia Continue statin as ordered and reviewed, no changes at this time   4. Gastroesophageal reflux disease, esophagitis presence not specified Continue PPI as already ordered, this medication has been reviewed and there are no changes at this time.  Avoidence of caffeine and alcohol  Moderate elevation of the head of the bed    Hortencia Pilar, MD  01/17/2018 11:28 AM

## 2018-02-14 ENCOUNTER — Other Ambulatory Visit: Payer: Self-pay | Admitting: Internal Medicine

## 2018-06-04 ENCOUNTER — Encounter (INDEPENDENT_AMBULATORY_CARE_PROVIDER_SITE_OTHER): Payer: Medicare Other | Admitting: Ophthalmology

## 2018-06-28 ENCOUNTER — Encounter (INDEPENDENT_AMBULATORY_CARE_PROVIDER_SITE_OTHER): Payer: Medicare Other | Admitting: Ophthalmology

## 2018-06-28 DIAGNOSIS — H353132 Nonexudative age-related macular degeneration, bilateral, intermediate dry stage: Secondary | ICD-10-CM | POA: Diagnosis not present

## 2018-06-28 DIAGNOSIS — H35033 Hypertensive retinopathy, bilateral: Secondary | ICD-10-CM

## 2018-06-28 DIAGNOSIS — I10 Essential (primary) hypertension: Secondary | ICD-10-CM | POA: Diagnosis not present

## 2018-06-28 DIAGNOSIS — H43813 Vitreous degeneration, bilateral: Secondary | ICD-10-CM | POA: Diagnosis not present

## 2018-07-03 ENCOUNTER — Ambulatory Visit
Admission: RE | Admit: 2018-07-03 | Discharge: 2018-07-03 | Disposition: A | Discharge status: Home / Self Care | Payer: Medicare Other | Source: Ambulatory Visit | Attending: Oncology | Admitting: Oncology

## 2018-07-03 DIAGNOSIS — C3411 Malignant neoplasm of upper lobe, right bronchus or lung: Secondary | ICD-10-CM | POA: Insufficient documentation

## 2018-07-03 LAB — POCT I-STAT CREATININE: CREATININE: 0.9 mg/dL (ref 0.44–1.00)

## 2018-07-03 MED ORDER — IOHEXOL 300 MG/ML  SOLN
75.0000 mL | Freq: Once | INTRAMUSCULAR | Status: AC | PRN
  Administered 2018-07-03: 75 mL via INTRAVENOUS

## 2018-07-05 ENCOUNTER — Encounter: Payer: Self-pay | Admitting: Internal Medicine

## 2018-07-05 ENCOUNTER — Other Ambulatory Visit: Payer: Self-pay

## 2018-07-05 ENCOUNTER — Inpatient Hospital Stay: Payer: Medicare Other

## 2018-07-05 ENCOUNTER — Inpatient Hospital Stay: Payer: Medicare Other | Attending: Internal Medicine | Admitting: Internal Medicine

## 2018-07-05 VITALS — BP 132/78 | HR 77 | Temp 97.2°F | Resp 18 | Ht 63.0 in | Wt 144.0 lb

## 2018-07-05 DIAGNOSIS — Z79899 Other long term (current) drug therapy: Secondary | ICD-10-CM | POA: Diagnosis not present

## 2018-07-05 DIAGNOSIS — Z8042 Family history of malignant neoplasm of prostate: Secondary | ICD-10-CM

## 2018-07-05 DIAGNOSIS — I10 Essential (primary) hypertension: Secondary | ICD-10-CM | POA: Diagnosis not present

## 2018-07-05 DIAGNOSIS — Z9221 Personal history of antineoplastic chemotherapy: Secondary | ICD-10-CM | POA: Insufficient documentation

## 2018-07-05 DIAGNOSIS — C3411 Malignant neoplasm of upper lobe, right bronchus or lung: Secondary | ICD-10-CM | POA: Diagnosis present

## 2018-07-05 DIAGNOSIS — Z923 Personal history of irradiation: Secondary | ICD-10-CM | POA: Diagnosis not present

## 2018-07-05 DIAGNOSIS — Z87891 Personal history of nicotine dependence: Secondary | ICD-10-CM | POA: Diagnosis not present

## 2018-07-05 LAB — CBC WITH DIFFERENTIAL/PLATELET
ABS IMMATURE GRANULOCYTES: 0.01 10*3/uL (ref 0.00–0.07)
BASOS ABS: 0 10*3/uL (ref 0.0–0.1)
BASOS PCT: 0 %
EOS ABS: 0.1 10*3/uL (ref 0.0–0.5)
Eosinophils Relative: 1 %
HCT: 37.2 % (ref 36.0–46.0)
Hemoglobin: 11.9 g/dL — ABNORMAL LOW (ref 12.0–15.0)
IMMATURE GRANULOCYTES: 0 %
Lymphocytes Relative: 27 %
Lymphs Abs: 2.1 10*3/uL (ref 0.7–4.0)
MCH: 27.5 pg (ref 26.0–34.0)
MCHC: 32 g/dL (ref 30.0–36.0)
MCV: 86.1 fL (ref 80.0–100.0)
MONOS PCT: 9 %
Monocytes Absolute: 0.7 10*3/uL (ref 0.1–1.0)
NEUTROS PCT: 63 %
NRBC: 0 % (ref 0.0–0.2)
Neutro Abs: 5 10*3/uL (ref 1.7–7.7)
PLATELETS: 253 10*3/uL (ref 150–400)
RBC: 4.32 MIL/uL (ref 3.87–5.11)
RDW: 13.3 % (ref 11.5–15.5)
WBC: 8 10*3/uL (ref 4.0–10.5)

## 2018-07-05 LAB — COMPREHENSIVE METABOLIC PANEL
ALBUMIN: 4.1 g/dL (ref 3.5–5.0)
ALT: 15 U/L (ref 0–44)
ANION GAP: 6 (ref 5–15)
AST: 17 U/L (ref 15–41)
Alkaline Phosphatase: 80 U/L (ref 38–126)
BILIRUBIN TOTAL: 0.5 mg/dL (ref 0.3–1.2)
BUN: 19 mg/dL (ref 8–23)
CALCIUM: 9.9 mg/dL (ref 8.9–10.3)
CO2: 30 mmol/L (ref 22–32)
Chloride: 104 mmol/L (ref 98–111)
Creatinine, Ser: 0.92 mg/dL (ref 0.44–1.00)
GFR calc Af Amer: >60 mL/min (ref >60)
GFR calc non Af Amer: 57 mL/min — ABNORMAL LOW (ref >60)
GLUCOSE: 89 mg/dL (ref 70–99)
POTASSIUM: 4.1 mmol/L (ref 3.5–5.1)
SODIUM: 140 mmol/L (ref 135–145)
TOTAL PROTEIN: 8.1 g/dL (ref 6.5–8.1)

## 2018-07-05 NOTE — Assessment & Plan Note (Addendum)
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  Currently on surveillance.  Stable.  #January 2020-CT scan stable right paramediastinal radiation changes.  Clinically, no evidence of recurrence.  Continue surveillance.  # Labs today are stable.   # DISPOSITION: # labs today-cbc/cmp # Follow up in 6 months- MD; labs-cbc/cmp;CT chest prior-Dr.B  # I reviewed the blood work- with the patient in detail; also reviewed the imaging independently [as summarized above]; and with the patient in detail.   # 25 minutes face-to-face with the patient discussing the above plan of care; more than 50% of time spent on prognosis/ natural history; counseling and coordination.

## 2018-07-05 NOTE — Progress Notes (Signed)
[place] OFFICE PROGRESS NOTE  Patient Care Team: [name], MD as PCP - General (Internal Medicine)  Cancer Staging No matching staging information was found for the patient.   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  Starting radiation and chemotherapy from April 14, 2014 Patient was started on carboplatinum andTaxol Herve were developed an allergic reaction to Taxol so would be changed to Abraxane 3.patient has finished 6 cycles of weekly chemotherapy with carboplatinum  and radiation therapy(May 28, 2014) 4.started on  NIVOLULAMAB because of persistent disease July 02, 2014. 5.  NIVOLULAMAB was discontinued because of persistent diarrhea in July of 2016.  August of 2016 CT scan was stable so no further chemotherapy  # AUG 25th PET- STABLE RUL MASS [radiation fibrosis; <1cm ? Mediastinal recurrence]; repeat PET in 4 m   # AAA/ 3.3x 3.9 Stable [Dr.Schnier] -----------------------------------------------------   DIAGNOSIS: Lung cancer  STAGE:  IV-NED       ;GOALS: control  CURRENT/MOST RECENT THERAPY : surveillance      Cancer of upper lobe of right lung (HCC)     INTERVAL HISTORY:  Heather Cooper 85 y.o.  female pleasant patient above history of Right upper lobe adenoca cell Stage IV lung cancer [contralateral lower lobe lung nodule]- currently on surveillance is here for follow-up/review the results of the CT scan.  Patient recently lost her husband; coping up fairly well.  Patient has mild cough otherwise no shortness of breath or chest pain.  Denies any headaches.  Continued  numbness.  Appetite is good.   Review of Systems  Constitutional: Negative for chills, diaphoresis, fever, malaise/fatigue and weight loss.  HENT: Negative for nosebleeds and sore throat.   Eyes: Negative for double vision.  Respiratory: Negative for cough, hemoptysis, sputum production, shortness of breath and wheezing.   Cardiovascular: Negative for chest pain, palpitations, orthopnea and leg swelling.  Gastrointestinal: Negative for abdominal pain, blood in stool, constipation, diarrhea, heartburn, melena, nausea and vomiting.  Genitourinary: Negative for dysuria, frequency and urgency.  Musculoskeletal: Negative for back pain and joint pain.  Skin: Negative.  Negative for itching and rash.  Neurological: Negative for dizziness, tingling, focal weakness, weakness and headaches.  Endo/Heme/Allergies: Does not bruise/bleed easily.  Psychiatric/Behavioral: Negative for depression. The patient is not nervous/anxious and does not have insomnia.      PAST MEDICAL HISTORY :  Past Medical History:  Diagnosis Date  . Chicken pox   . Hypercalcemia    familial hypocalciuric hypercalcemia  . Hypercholesterolemia   . Hypertension   . Lung cancer ([place])   . Osteoporosis   . Thyroid disease     PAST SURGICAL HISTORY :   Past Surgical History:  Procedure Laterality Date  . ABDOMINAL HYSTERECTOMY     partial  . CHOLECYSTECTOMY    . COLONOSCOPY WITH PROPOFOL N/A 04/17/2017   Procedure: COLONOSCOPY WITH PROPOFOL;  Surgeon: Manya Silvas, MD;  Location: [facility] ENDOSCOPY;  Service: Endoscopy;  Laterality: N/A;  . transvaginal hysterectomy  04/18/05   with anterior colporrhaphy    FAMILY HISTORY :   Family History  Problem Relation Age of Onset  .  Stroke Mother   . Hypertension Mother   . Prostate cancer Father   . Cancer Father        prostate  . Heart disease Brother        s/p CABG  . Colon cancer Neg Hx     SOCIAL HISTORY:   Social History   Tobacco Use  . Smoking status:  Former Smoker    Last attempt to quit: 06/13/1997    Years since quitting: 21.0  . Smokeless tobacco: Never Used  Substance Use Topics  . Alcohol use: No    Alcohol/week: 0.0 standard drinks  . Drug use: No    ALLERGIES:  is allergic to paclitaxel.  MEDICATIONS:  Current Outpatient Medications  Medication Sig Dispense Refill  . olmesartan-hydrochlorothiazide (BENICAR HCT) 20-12.5 MG tablet TAKE 1 TABLET BY MOUTH ONCE DAILY 90 tablet 1  . pantoprazole (PROTONIX) 40 MG tablet TAKE 1 TABLET BY MOUTH ONCE DAILY 30 tablet 1   No current facility-administered medications for this visit.    Facility-Administered Medications Ordered in Other Visits  Medication Dose Route Frequency Provider Last Rate Last Dose  . sodium chloride 0.9 % 1,000 mL with potassium chloride 20 mEq, magnesium sulfate 2 g infusion   Intravenous Continuous Forest Gleason, MD   Stopped at 12/04/14 1300    PHYSICAL EXAMINATION: ECOG PERFORMANCE STATUS: 0 - Asymptomatic  BP 132/78 (BP Location: Left Arm, Patient Position: Sitting)   Pulse 77   Temp (!) 97.2 F (36.2 C) (Tympanic)   Resp 18   Ht 5' 3"  (1.6 m)   Wt 144 lb (65.3 kg)   BMI 25.51 kg/m   Filed Weights   07/05/18 1118  Weight: 144 lb (65.3 kg)    Physical Exam  Constitutional: She is oriented to person, place, and time and well-developed, well-nourished, and in no distress.  Accompanied her daughter.  Walking herself  HENT:  Head: Normocephalic and atraumatic.  Mouth/Throat: Oropharynx is clear and moist. No oropharyngeal exudate.  Eyes: Pupils are equal, round, and reactive to light.  Neck: Normal range of motion. Neck supple.  Cardiovascular: Normal rate and regular rhythm.  Pulmonary/Chest: Effort normal and breath sounds normal. No respiratory distress. She has no wheezes.  Abdominal: Soft. Bowel sounds are normal. She exhibits no distension and no mass. There is no abdominal tenderness. There is no rebound and no guarding.   Musculoskeletal: Normal range of motion.        General: No tenderness or edema.  Neurological: She is alert and oriented to person, place, and time.  Skin: Skin is warm.  Psychiatric: Affect normal.     LABORATORY DATA:  I have reviewed the data as listed    Component Value Date/Time   NA 140 07/05/2018 1124   NA 130 (L) 10/09/2014 0846   K 4.1 07/05/2018 1124   K 4.1 10/09/2014 0846   CL 104 07/05/2018 1124   CL 98 (L) 10/09/2014 0846   CO2 30 07/05/2018 1124   CO2 25 10/09/2014 0846   GLUCOSE 89 07/05/2018 1124   GLUCOSE 161 (H) 10/09/2014 0846   BUN 19 07/05/2018 1124   BUN 14 10/09/2014 0846   CREATININE 0.92 07/05/2018 1124   CREATININE 0.70 10/09/2014 0846   CALCIUM 9.9 07/05/2018 1124   CALCIUM 9.1 10/09/2014 0846   PROT 8.1 07/05/2018 1124   PROT 7.3 10/09/2014 0846   ALBUMIN 4.1 07/05/2018 1124   ALBUMIN 3.6 10/09/2014 0846   AST 17 07/05/2018 1124   AST 16 10/09/2014  0846   ALT 15 07/05/2018 1124   ALT 13 (L) 10/09/2014 0846   ALKPHOS 80 07/05/2018 1124   ALKPHOS 70 10/09/2014 0846   BILITOT 0.5 07/05/2018 1124   BILITOT 1.0 10/09/2014 0846   GFRNONAA 57 (L) 07/05/2018 1124   GFRNONAA >60 10/09/2014 0846   GFRAA >60 07/05/2018 1124   GFRAA >60 10/09/2014 0846    No results found for: SPEP, UPEP  Lab Results  Component Value Date   WBC 8.0 07/05/2018   NEUTROABS 5.0 07/05/2018   HGB 11.9 (L) 07/05/2018   HCT 37.2 07/05/2018   MCV 86.1 07/05/2018   PLT 253 07/05/2018      Chemistry      Component Value Date/Time   NA 140 07/05/2018 1124   NA 130 (L) 10/09/2014 0846   K 4.1 07/05/2018 1124   K 4.1 10/09/2014 0846   CL 104 07/05/2018 1124   CL 98 (L) 10/09/2014 0846   CO2 30 07/05/2018 1124   CO2 25 10/09/2014 0846   BUN 19 07/05/2018 1124   BUN 14 10/09/2014 0846   CREATININE 0.92 07/05/2018 1124   CREATININE 0.70 10/09/2014 0846   GLU 111 03/18/2014 1048      Component Value Date/Time   CALCIUM 9.9 07/05/2018 1124   CALCIUM 9.1  10/09/2014 0846   ALKPHOS 80 07/05/2018 1124   ALKPHOS 70 10/09/2014 0846   AST 17 07/05/2018 1124   AST 16 10/09/2014 0846   ALT 15 07/05/2018 1124   ALT 13 (L) 10/09/2014 0846   BILITOT 0.5 07/05/2018 1124   BILITOT 1.0 10/09/2014 0846       RADIOGRAPHIC STUDIES: I have personally reviewed the radiological images as listed and agreed with the findings in the report. No results found.   ASSESSMENT & PLAN:  Cancer of upper lobe of right lung ([place]) Right upper lobe lung cancer adeno ca- Stage IV [contralateral left lower lobe lung nodule] status post chemoradiation-s/p Nivo.  Currently on surveillance.  Stable.  #January 2020-CT scan stable right paramediastinal radiation changes.  Clinically, no evidence of recurrence.  Continue surveillance.  # Labs today are stable.   # DISPOSITION: # labs today-cbc/cmp # Follow up in 6 months- MD; labs-cbc/cmp;CT chest prior-Dr.B  # I reviewed the blood work- with the patient in detail; also reviewed the imaging independently [as summarized above]; and with the patient in detail.   # 25 minutes face-to-face with the patient discussing the above plan of care; more than 50% of time spent on prognosis/ natural history; counseling and coordination.   Orders Placed This Encounter  Procedures  . CT CHEST WO CONTRAST    Standing Status:   Future    Standing Expiration Date:   07/06/2019    Order Specific Question:   Preferred imaging location?    Answer:   [facility]    Order Specific Question:   Radiology Contrast Protocol - do NOT remove file path    Answer:   [email or web address]    Order Specific Question:   ** REASON FOR EXAM (FREE TEXT)    Answer:   lung cancer   All questions were answered. The patient knows to call the clinic with any problems, questions or concerns.      Cammie Sickle, MD 07/05/2018 7:18 PM

## 2018-08-16 ENCOUNTER — Other Ambulatory Visit: Payer: Self-pay | Admitting: Internal Medicine

## 2018-10-17 ENCOUNTER — Other Ambulatory Visit: Payer: Self-pay | Admitting: Internal Medicine

## 2018-12-19 ENCOUNTER — Other Ambulatory Visit: Payer: Self-pay | Admitting: Internal Medicine

## 2018-12-31 ENCOUNTER — Other Ambulatory Visit: Payer: Self-pay

## 2018-12-31 DIAGNOSIS — Z20828 Contact with and (suspected) exposure to other viral communicable diseases: Secondary | ICD-10-CM

## 2018-12-31 DIAGNOSIS — Z20822 Contact with and (suspected) exposure to covid-19: Secondary | ICD-10-CM

## 2019-01-03 LAB — NOVEL CORONAVIRUS, NAA: SARS-CoV-2, NAA: NOT DETECTED

## 2019-01-04 ENCOUNTER — Ambulatory Visit: Payer: Medicare Other

## 2019-01-04 ENCOUNTER — Encounter: Payer: Self-pay | Admitting: Internal Medicine

## 2019-01-04 ENCOUNTER — Other Ambulatory Visit: Payer: Self-pay

## 2019-01-04 ENCOUNTER — Ambulatory Visit (INDEPENDENT_AMBULATORY_CARE_PROVIDER_SITE_OTHER): Payer: Medicare Other | Admitting: Internal Medicine

## 2019-01-04 VITALS — BP 140/74 | HR 89 | Temp 96.9°F | Ht 63.0 in | Wt 155.0 lb

## 2019-01-04 DIAGNOSIS — R739 Hyperglycemia, unspecified: Secondary | ICD-10-CM

## 2019-01-04 DIAGNOSIS — K219 Gastro-esophageal reflux disease without esophagitis: Secondary | ICD-10-CM

## 2019-01-04 DIAGNOSIS — I714 Abdominal aortic aneurysm, without rupture, unspecified: Secondary | ICD-10-CM

## 2019-01-04 DIAGNOSIS — I10 Essential (primary) hypertension: Secondary | ICD-10-CM

## 2019-01-04 DIAGNOSIS — C3411 Malignant neoplasm of upper lobe, right bronchus or lung: Secondary | ICD-10-CM | POA: Diagnosis not present

## 2019-01-04 DIAGNOSIS — D649 Anemia, unspecified: Secondary | ICD-10-CM

## 2019-01-04 DIAGNOSIS — E7800 Pure hypercholesterolemia, unspecified: Secondary | ICD-10-CM

## 2019-01-04 NOTE — Progress Notes (Signed)
Patient ID: Heather Cooper, female   DOB: [birth year], 85 y.o.   MRN: [id number]   Virtual Visit via telephone Note  This visit type was conducted due to national recommendations for restrictions regarding the COVID-19 pandemic (e.g. social distancing).  This format is felt to be most appropriate for this patient at this time.  All issues noted in this document were discussed and addressed.  No physical exam was performed (except for noted visual exam findings with Video Visits).   I connected with Heather Cooper by telephone and verified that I am speaking with the correct person using two identifiers. Location patient: home Location provider: work Persons participating in the telephone visit: patient, provider  I discussed the limitations, risks, security and privacy concerns of performing an evaluation and management service by telephone and the availability of in person appointments.  The patient expressed understanding and agreed to proceed.   Reason for visit: scheduled follow up.   HPI: She reports she is doing well.  Handling stress.  Has good support.  Her son lives with her.  Sees Dr Rogue Bussing for f/u lung cancer.  Has f/u planned in 01/2019. F/u chest CT scheduled.  Saw Dr Delana Meyer for f/u AAA.  Stable.  Last evaluated 01/15/18. Recommended f/u in 24 months.  No chest pain.  Breathing stable. No acid reflux.  No abdominal pain.  Bowels moving.  Taking protonix.  Symptoms controlled.  Wants to take qod.  Was tested for covid.  States her granddaughter's boyfriend's mother tested positive.  She has had no symptoms. No fever.  No chest congestion or cough.  Overall feels good.  Eating well.     ROS: See pertinent positives and negatives per HPI.  Past Medical History:  Diagnosis Date  . Chicken pox   . Hypercalcemia    familial hypocalciuric hypercalcemia  . Hypercholesterolemia   . Hypertension   . Lung cancer ([place])   . Osteoporosis   . Thyroid disease     Past Surgical  History:  Procedure Laterality Date  . ABDOMINAL HYSTERECTOMY     partial  . CHOLECYSTECTOMY    . COLONOSCOPY WITH PROPOFOL N/A 04/17/2017   Procedure: COLONOSCOPY WITH PROPOFOL;  Surgeon: Manya Silvas, MD;  Location: [facility] ENDOSCOPY;  Service: Endoscopy;  Laterality: N/A;  . transvaginal hysterectomy  04/18/05   with anterior colporrhaphy    Family History  Problem Relation Age of Onset  . Stroke Mother   . Hypertension Mother   . Prostate cancer Father   . Cancer Father        prostate  . Heart disease Brother        s/p CABG  . Colon cancer Neg Hx     SOCIAL HX: reviewed.    Current Outpatient Medications:  .  olmesartan-hydrochlorothiazide (BENICAR HCT) 20-12.5 MG tablet, TAKE 1 TABLET BY MOUTH ONCE DAILY, Disp: 90 tablet, Rfl: 1 .  pantoprazole (PROTONIX) 40 MG tablet, TAKE 1 TABLET BY MOUTH ONCE DAILY, Disp: 90 tablet, Rfl: 3 No current facility-administered medications for this visit.   Facility-Administered Medications Ordered in Other Visits:  .  sodium chloride 0.9 % 1,000 mL with potassium chloride 20 mEq, magnesium sulfate 2 g infusion, , Intravenous, Continuous, Choksi, Janak, MD, Stopped at 12/04/14 1300  EXAM:  VITALS per patient if applicable:  160/73, 89, 96.9, 155 pounds and 98%  GENERAL: alert.  Sounds to be in no acute distress.  Answering questions appropriately.    PSYCH/NEURO: pleasant and cooperative, no obvious depression  or anxiety, speech and thought processing grossly intact  ASSESSMENT AND PLAN:  Discussed the following assessment and plan:  Abdominal aortic aneurysm ([place]) Evaluated by Dr Ronalee Belts.  Stable.  Evaluated 01/15/18.  Recommended f/u in 24 months.    Cancer of upper lobe of right lung ([place]) Followed by Dr Rogue Bussing.  Last evaluated 06/2018.  Stable.  Recommended 6 month f/u with chest CT prior.    GERD (gastroesophageal reflux disease) Controlled.  Will change protonix to qod.  Follow.    Hypercalcemia Has been followed by  Dr Forde Dandy.  Recheck metabolic panel.    Hypercholesterolemia Low cholesterol diet and exercise.  Follow lipid panel.    Hyperglycemia Low carb diet and exercise.  Follow met b and a1c.    Hypertension Blood pressure has been doing relatively well.  Follow pressures.  Follow metabolic panel.     I discussed the assessment and treatment plan with the patient. The patient was provided an opportunity to ask questions and all were answered. The patient agreed with the plan and demonstrated an understanding of the instructions.   The patient was advised to call back or seek an in-person evaluation if the symptoms worsen or if the condition fails to improve as anticipated.  I provided 17 minutes of non-face-to-face time during this encounter.   [name], MD

## 2019-01-06 ENCOUNTER — Encounter: Payer: Self-pay | Admitting: Internal Medicine

## 2019-01-06 NOTE — Assessment & Plan Note (Signed)
Low cholesterol diet and exercise.  Follow lipid panel.   

## 2019-01-06 NOTE — Assessment & Plan Note (Signed)
Has been followed by Dr Forde Dandy.  Recheck metabolic panel.

## 2019-01-06 NOTE — Assessment & Plan Note (Signed)
Controlled.  Will change protonix to qod.  Follow.

## 2019-01-06 NOTE — Assessment & Plan Note (Signed)
Followed by Dr Rogue Bussing.  Last evaluated 06/2018.  Stable.  Recommended 6 month f/u with chest CT prior.

## 2019-01-06 NOTE — Assessment & Plan Note (Signed)
Evaluated by Dr Ronalee Belts.  Stable.  Evaluated 01/15/18.  Recommended f/u in 24 months.

## 2019-01-06 NOTE — Assessment & Plan Note (Signed)
Low carb diet and exercise.  Follow met b and a1c.

## 2019-01-06 NOTE — Assessment & Plan Note (Signed)
Blood pressure has been doing relatively well.  Follow pressures.  Follow metabolic panel.

## 2019-01-07 ENCOUNTER — Ambulatory Visit (INDEPENDENT_AMBULATORY_CARE_PROVIDER_SITE_OTHER): Payer: Medicare Other

## 2019-01-07 ENCOUNTER — Telehealth: Payer: Self-pay

## 2019-01-07 DIAGNOSIS — Z0000 Encounter for general adult medical examination without abnormal findings: Secondary | ICD-10-CM | POA: Diagnosis not present

## 2019-01-07 NOTE — Telephone Encounter (Signed)
-----   Message from [name], MD sent at 01/06/2019  8:35 PM EDT ----- Regarding: cancel lab appt Please cancel the 01/16/19 appt. Pt was tested for covid 12/31/18 and cannot come in then.  Reschedule for later in 01/2019.  Lab orders are in the computer.  Thanks    Dr Nicki Reaper

## 2019-01-07 NOTE — Patient Instructions (Addendum)
  Heather Cooper , Thank you for taking time to come for your Medicare Wellness Visit. I appreciate your ongoing commitment to your health goals. Please review the following plan we discussed and let me know if I can assist you in the future.   These are the goals we discussed: Goals    . Maintain Healthy Lifestyle     Brain engagement activity (read the newspaper/book, word search etc..) Stay active Stay hydrated Healthy diet        This is a list of the screening recommended for you and due dates:  Health Maintenance  Topic Date Due  . Tetanus Vaccine  12/30/1949  . Pneumonia vaccines (2 of 2 - PCV13) 06/26/2013  . Flu Shot  01/12/2019  . DEXA scan (bone density measurement)  Completed  . Mammogram  Discontinued

## 2019-01-07 NOTE — Progress Notes (Signed)
Subjective:   Heather Cooper is a 85 y.o. female who presents for Medicare Annual (Subsequent) preventive examination.  Review of Systems:  No ROS.  Medicare Wellness Virtual Visit.  Visual/audio telehealth visit, UTA vital signs.   See social history for additional risk factors.   Cardiac Risk Factors include: advanced age (>46men, >23 women);hypertension     Objective:     Vitals: There were no vitals taken for this visit.  There is no height or weight on file to calculate BMI.  Advanced Directives 01/07/2019 07/05/2018 01/03/2018 01/01/2018 04/17/2017 04/04/2017 12/28/2016  Does Patient Have a Medical Advance Directive? Yes Yes Yes Yes Yes Yes Yes  Type of Paramedic of [place];Living will [place];Living will Living will [place];Living will Living will Living will [place];Living will  Does patient want to make changes to medical advance directive? No - Patient declined - No - Patient declined No - Patient declined - - -  Copy of [place] in Chart? No - copy requested No - copy requested No - copy requested No - copy requested - - -  Would patient like information on creating a medical advance directive? - No - Patient declined - - No - Patient declined No - Patient declined -    Tobacco Social History   Tobacco Use  Smoking Status Former Smoker  . Quit date: 06/13/1997  . Years since quitting: 21.5  Smokeless Tobacco Never Used     Counseling given: Not Answered   Clinical Intake:  Pre-visit preparation completed: Yes        Diabetes: No  How often do you need to have someone help you when you read instructions, pamphlets, or other written materials from your doctor or pharmacy?: 2 - Rarely  Interpreter Needed?: No     Past Medical History:  Diagnosis Date  . Chicken pox   . Hypercalcemia    familial hypocalciuric hypercalcemia  . Hypercholesterolemia    . Hypertension   . Lung cancer ([place])   . Osteoporosis   . Thyroid disease    Past Surgical History:  Procedure Laterality Date  . ABDOMINAL HYSTERECTOMY     partial  . CHOLECYSTECTOMY    . COLONOSCOPY WITH PROPOFOL N/A 04/17/2017   Procedure: COLONOSCOPY WITH PROPOFOL;  Surgeon: Manya Silvas, MD;  Location: [facility] ENDOSCOPY;  Service: Endoscopy;  Laterality: N/A;  . transvaginal hysterectomy  04/18/05   with anterior colporrhaphy   Family History  Problem Relation Age of Onset  . Stroke Mother   . Hypertension Mother   . Prostate cancer Father   . Cancer Father        prostate  . Heart disease Brother        s/p CABG  . Colon cancer Neg Hx    Social History   Socioeconomic History  . Marital status: Widowed    Spouse name: Not on file  . Number of children: 3  . Years of education: Not on file  . Highest education level: Not on file  Occupational History  . Not on file  Social Needs  . Financial resource strain: Not hard at all  . Food insecurity    Worry: Never true    Inability: Never true  . Transportation needs    Medical: No    Non-medical: No  Tobacco Use  . Smoking status: Former Smoker    Quit date: 06/13/1997    Years since quitting:  21.5  . Smokeless tobacco: Never Used  Substance and Sexual Activity  . Alcohol use: No    Alcohol/week: 0.0 standard drinks  . Drug use: No  . Sexual activity: Not on file  Lifestyle  . Physical activity    Days per week: 0 days    Minutes per session: Not on file  . Stress: Not at all  Relationships  . Social Herbalist on phone: Not on file    Gets together: Not on file    Attends religious service: Not on file    Active member of club or organization: Not on file    Attends meetings of clubs or organizations: Not on file    Relationship status: Not on file  Other Topics Concern  . Not on file  Social History Narrative   Lost her husband April 28, 2018    Outpatient Encounter Medications as of  01/07/2019  Medication Sig  . olmesartan-hydrochlorothiazide (BENICAR HCT) 20-12.5 MG tablet TAKE 1 TABLET BY MOUTH ONCE DAILY  . pantoprazole (PROTONIX) 40 MG tablet TAKE 1 TABLET BY MOUTH ONCE DAILY   Facility-Administered Encounter Medications as of 01/07/2019  Medication  . sodium chloride 0.9 % 1,000 mL with potassium chloride 20 mEq, magnesium sulfate 2 g infusion    Activities of Daily Living In your present state of health, do you have any difficulty performing the following activities: 01/07/2019  Hearing? N  Vision? N  Difficulty concentrating or making decisions? N  Walking or climbing stairs? N  Dressing or bathing? N  Doing errands, shopping? Y  Comment She does not Physiological scientist and eating ? Y  Comment Son and daughter assist with meal prep as needed. Self feed.  Using the Toilet? N  In the past six months, have you accidently leaked urine? N  Do you have problems with loss of bowel control? N  Managing your Medications? N  Managing your Finances? Y  Comment Son and daughter assist as needed  Housekeeping or managing your Housekeeping? Y  Comment Son and daughter assist as needed  Some recent data might be hidden    Patient Care Team: [name], MD as PCP - General (Internal Medicine)    Assessment:   This is a routine wellness examination for [place].   I connected with patient 01/07/19 at 10:30 AM EDT by a video/audio enabled telemedicine application and verified that I am speaking with the correct person using two identifiers. Patient stated full name and DOB. Patient gave permission to continue with virtual visit. Patient's location was at home and Nurse's location was at [place] office.   Health Screenings  Mammogram - 11/2013. Discontinued Colonoscopy - 04/28/2017 Bone Density - 07/2010 Glaucoma -none Hearing -demonstrates normal hearing during visit. Hemoglobin A1C - 08/2016 (6.8) Cholesterol - 08/2016 CT Chest w/o contrast- 01/2019 Dental-  dentures Vision- visits within the last 12 months.  Social  Alcohol intake - no       Smoking history- former    Smokers in home? none Illicit drug use? none Exercise - stair climbing, stationary peddler Diet - regular Sexually Active -not currently BMI- discussed the importance of a healthy diet, water intake and the benefits of aerobic exercise.  Educational material provided.   Safety  Patient feels safe at home- yes.  Lives with son. Husband passed away last April 29, 2023.  Patient does have smoke detectors at home- yes Patient does wear sunscreen or protective clothing when in direct sunlight -yes Patient does wear  seat belt when in a moving vehicle -yes Patient drives- no  [id number] precautions and sickness symptoms discussed.   Activities of Daily Living Patient denies needing assistance with: some household chores, feeding themselves, getting from bed to chair, getting to the toilet, bathing/showering, dressing.  Son and daughter assist as with meal prep, money management and ascneeded.   Depression Screen Patient denies losing interest in daily life, feeling hopeless, or crying easily over simple problems.   Medication-taking as directed and without issues.   Fall Screen Patient denies being afraid of falling or falling in the last year.   Memory Screen Patient is alert.  Correctly identified the president of the [place], season and recall 2/3. Patient likes to read and play cross word puzzles for brain stimulation.  Immunizations The following Immunizations were discussed: Influenza, shingles, pneumonia, and tetanus.   Other Providers Patient Care Team: [name], MD as PCP - General (Internal Medicine)   Exercise Activities and Dietary recommendations Current Exercise Habits: Home exercise routine, Type of exercise: calisthenics(stair climbing, stationary peddling), Time (Minutes): 20, Frequency (Times/Week): 3, Weekly Exercise (Minutes/Week): 60, Intensity:  Mild  Goals    . Maintain Healthy Lifestyle     Brain engagement activity (read the newspaper/book, word search etc..) Stay active Stay hydrated Healthy diet        Fall Risk Fall Risk  01/03/2018 10/10/2016 04/14/2016 10/11/2013 10/07/2012  Falls in the past year? No No No No No   Is the patient's home free of loose throw rugs in walkways, pet beds, electrical cords, etc? yes      Grab bars in the bathroom? yes      Handrails on the stairs? yes      Adequate lighting?  yes  Depression Screen PHQ 2/9 Scores 01/04/2019 01/03/2018 10/10/2016 04/14/2016  PHQ - 2 Score 0 0 0 0  PHQ- 9 Score - - 0 -     Cognitive Function     6CIT Screen 01/07/2019 01/03/2018 10/10/2016  What Year? 0 points 0 points 0 points  What month? 0 points 0 points 0 points  What time? 0 points 0 points 0 points  Count back from 20 0 points 0 points 0 points  Months in reverse 0 points 0 points 0 points  Repeat phrase - 0 points 0 points  Total Score - 0 0    Immunization History  Administered Date(s) Administered  . Influenza Split 03/24/2014  . Influenza, High Dose Seasonal PF 03/21/2016, 02/22/2017, 02/20/2018  . Influenza,inj,Quad PF,6+ Mos 03/02/2015  . Influenza-Unspecified 03/14/2012, 02/17/2018  . Pneumococcal Polysaccharide-23 06/26/2012   Screening Tests Health Maintenance  Topic Date Due  . TETANUS/TDAP  12/30/1949  . PNA vac Low Risk Adult (2 of 2 - PCV13) 06/26/2013  . INFLUENZA VACCINE  01/12/2019  . DEXA SCAN  Completed  . MAMMOGRAM  Discontinued     Plan:    End of life planning; Advance aging; Advanced directives discussed.  Copy of current HCPOA/Living Will requested.    I have personally reviewed and noted the following in the patient's chart:   . Medical and social history . Use of alcohol, tobacco or illicit drugs  . Current medications and supplements . Functional ability and status . Nutritional status . Physical activity . Advanced directives . List of other  physicians . Hospitalizations, surgeries, and ER visits in previous 12 months . Vitals . Screenings to include cognitive, depression, and falls . Referrals and appointments  In addition, I have reviewed and discussed with  patient certain preventive protocols, quality metrics, and best practice recommendations. A written personalized care plan for preventive services as well as general preventive health recommendations were provided to patient.     Varney Biles, LPN  1/50/5697   Reviewed above information.  Agree with assessment and plan.    Dr Nicki Reaper

## 2019-01-07 NOTE — Telephone Encounter (Signed)
Patients lab appt has been moved. Patient is aware

## 2019-01-16 ENCOUNTER — Other Ambulatory Visit: Payer: Medicare Other

## 2019-01-28 ENCOUNTER — Other Ambulatory Visit: Payer: Self-pay

## 2019-01-28 ENCOUNTER — Ambulatory Visit
Admission: RE | Admit: 2019-01-28 | Discharge: 2019-01-28 | Disposition: A | Discharge status: Home / Self Care | Payer: Medicare Other | Source: Ambulatory Visit | Attending: Internal Medicine | Admitting: Internal Medicine

## 2019-01-28 DIAGNOSIS — C3411 Malignant neoplasm of upper lobe, right bronchus or lung: Secondary | ICD-10-CM | POA: Insufficient documentation

## 2019-02-01 ENCOUNTER — Other Ambulatory Visit: Payer: Self-pay

## 2019-02-01 ENCOUNTER — Other Ambulatory Visit: Payer: Self-pay | Admitting: *Deleted

## 2019-02-01 DIAGNOSIS — C3411 Malignant neoplasm of upper lobe, right bronchus or lung: Secondary | ICD-10-CM

## 2019-02-04 ENCOUNTER — Inpatient Hospital Stay: Payer: Medicare Other | Attending: Internal Medicine

## 2019-02-04 ENCOUNTER — Inpatient Hospital Stay (HOSPITAL_BASED_OUTPATIENT_CLINIC_OR_DEPARTMENT_OTHER): Payer: Medicare Other | Admitting: Internal Medicine

## 2019-02-04 ENCOUNTER — Encounter: Payer: Self-pay | Admitting: Internal Medicine

## 2019-02-04 ENCOUNTER — Other Ambulatory Visit: Payer: Self-pay

## 2019-02-04 DIAGNOSIS — C3411 Malignant neoplasm of upper lobe, right bronchus or lung: Secondary | ICD-10-CM | POA: Insufficient documentation

## 2019-02-04 DIAGNOSIS — Z87891 Personal history of nicotine dependence: Secondary | ICD-10-CM | POA: Insufficient documentation

## 2019-02-04 DIAGNOSIS — I10 Essential (primary) hypertension: Secondary | ICD-10-CM | POA: Insufficient documentation

## 2019-02-04 DIAGNOSIS — E079 Disorder of thyroid, unspecified: Secondary | ICD-10-CM | POA: Diagnosis not present

## 2019-02-04 LAB — CBC WITH DIFFERENTIAL/PLATELET
Abs Immature Granulocytes: 0.03 10*3/uL (ref 0.00–0.07)
Basophils Absolute: 0.1 10*3/uL (ref 0.0–0.1)
Basophils Relative: 1 %
Eosinophils Absolute: 0.2 10*3/uL (ref 0.0–0.5)
Eosinophils Relative: 2 %
HCT: 35.8 % — ABNORMAL LOW (ref 36.0–46.0)
Hemoglobin: 11.7 g/dL — ABNORMAL LOW (ref 12.0–15.0)
Immature Granulocytes: 0 %
Lymphocytes Relative: 28 %
Lymphs Abs: 2.3 10*3/uL (ref 0.7–4.0)
MCH: 28.3 pg (ref 26.0–34.0)
MCHC: 32.7 g/dL (ref 30.0–36.0)
MCV: 86.7 fL (ref 80.0–100.0)
Monocytes Absolute: 0.6 10*3/uL (ref 0.1–1.0)
Monocytes Relative: 7 %
Neutro Abs: 5.1 10*3/uL (ref 1.7–7.7)
Neutrophils Relative %: 62 %
Platelets: 279 10*3/uL (ref 150–400)
RBC: 4.13 MIL/uL (ref 3.87–5.11)
RDW: 13.2 % (ref 11.5–15.5)
WBC: 8.2 10*3/uL (ref 4.0–10.5)
nRBC: 0 % (ref 0.0–0.2)

## 2019-02-04 LAB — COMPREHENSIVE METABOLIC PANEL
ALT: 17 U/L (ref 0–44)
AST: 18 U/L (ref 15–41)
Albumin: 4.1 g/dL (ref 3.5–5.0)
Alkaline Phosphatase: 75 U/L (ref 38–126)
Anion gap: 10 (ref 5–15)
BUN: 31 mg/dL — ABNORMAL HIGH (ref 8–23)
CO2: 26 mmol/L (ref 22–32)
Calcium: 10.4 mg/dL — ABNORMAL HIGH (ref 8.9–10.3)
Chloride: 102 mmol/L (ref 98–111)
Creatinine, Ser: 0.99 mg/dL (ref 0.44–1.00)
GFR calc Af Amer: 60 mL/min — ABNORMAL LOW (ref >60)
GFR calc non Af Amer: 52 mL/min — ABNORMAL LOW (ref >60)
Glucose, Bld: 136 mg/dL — ABNORMAL HIGH (ref 70–99)
Potassium: 4.1 mmol/L (ref 3.5–5.1)
Sodium: 138 mmol/L (ref 135–145)
Total Bilirubin: 0.7 mg/dL (ref 0.3–1.2)
Total Protein: 8.5 g/dL — ABNORMAL HIGH (ref 6.5–8.1)

## 2019-02-04 NOTE — Assessment & Plan Note (Addendum)
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  Currently on surveillance.  Stable  #January 28, 2019-CT scan stable right paramediastinal radiation changes.  Clinically, no evidence of recurrence.  Continue surveillance-we will do scans on annual basis/patient family agreement.  # hemoglobin slightly low at 11.7 hb- recommend po iron/day; q o day; check iron stuides; coloscnoscopy in nov 2018/Dr.Elliot.  #Discussed with the patient's daughter, [name].  # DISPOSITION: add iron studies/ferrtin # Follow up in 6 months- MD; labs-cbc/cmp;-Dr.B  # I reviewed the blood work- with the patient in detail; also reviewed the imaging independently [as summarized above]; and with the patient in detail.

## 2019-02-04 NOTE — Progress Notes (Signed)
[facility] OFFICE PROGRESS NOTE  Patient Care Team: [name], MD as PCP - General (Internal Medicine)  Cancer Staging No matching staging information was found for the patient.   Oncology History Overview Note  # 2015-patient is an 85 year old female with 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  Starting radiation and chemotherapy from April 14, 2014 Patient was started on carboplatinum andTaxol Herve were developed an allergic reaction to Taxol so would be changed to Abraxane 3.patient has finished 6 cycles of weekly chemotherapy with carboplatinum  and radiation therapy(May 28, 2014) 4.started on  NIVOLULAMAB because of persistent disease July 02, 2014. 5.  NIVOLULAMAB was discontinued because of persistent diarrhea in July of 2016.  August of 2016 CT scan was stable so no further chemotherapy  # AUG 25th PET- STABLE RUL MASS [radiation fibrosis; <1cm ? Mediastinal recurrence];   # AAA/ 3.3x 3.9 Stable [Dr.Schnier] -----------------------------------------------------   DIAGNOSIS: Lung cancer  STAGE:  IV-NED       ;GOALS: control  CURRENT/MOST RECENT THERAPY : surveillance    Cancer of upper lobe of right lung (H[facility])     INTERVAL HISTORY:  Heather Cooper 85 y.o.  female pleasant patient above history of Right upper lobe adenoca cell Stage IV lung cancer [contralateral lower lobe lung nodule]- currently on surveillance is here for follow-up/review the results of the CT scan.  Patient continues to do well.  Denies any unusual shortness of breath or cough.  She denies any headaches; denies any nausea vomiting.  She denies any blood in stools or black or  stools.  Review of Systems  Constitutional: Negative for chills, diaphoresis, fever, malaise/fatigue and weight loss.  HENT: Negative for nosebleeds and sore throat.   Eyes: Negative for double vision.  Respiratory: Negative for cough, hemoptysis, sputum production, shortness of breath and wheezing.   Cardiovascular: Negative for chest pain, palpitations, orthopnea and leg swelling.  Gastrointestinal: Negative for abdominal pain, blood in stool, constipation, diarrhea, heartburn, melena, nausea and vomiting.  Genitourinary: Negative for dysuria, frequency and urgency.  Musculoskeletal: Negative for back pain and joint pain.  Skin: Negative.  Negative for itching and rash.  Neurological: Negative for dizziness, tingling, focal weakness, weakness and headaches.  Endo/Heme/Allergies: Does not bruise/bleed easily.  Psychiatric/Behavioral: Negative for depression. The patient is not nervous/anxious and does not have insomnia.      PAST MEDICAL HISTORY :  Past Medical History:  Diagnosis Date  . Chicken pox   . Hypercalcemia    familial hypocalciuric hypercalcemia  . Hypercholesterolemia   . Hypertension   . Lung cancer ([place])   . Osteoporosis   . Thyroid disease     PAST SURGICAL HISTORY :   Past Surgical History:  Procedure Laterality Date  . ABDOMINAL HYSTERECTOMY     partial  . CHOLECYSTECTOMY    . COLONOSCOPY WITH PROPOFOL N/A 04/17/2017   Procedure: COLONOSCOPY WITH PROPOFOL;  Surgeon: Manya Silvas, MD;  Location: [facility] ENDOSCOPY;  Service: Endoscopy;  Laterality: N/A;  . transvaginal hysterectomy  04/18/05   with anterior colporrhaphy    FAMILY HISTORY :   Family History  Problem Relation Age of Onset  . Stroke Mother   . Hypertension  Mother   . Prostate cancer Father   . Cancer Father        prostate  . Heart disease Brother        s/p CABG  . Colon cancer Neg Hx     SOCIAL HISTORY:   Social History   Tobacco Use  . Smoking status: Former Smoker    Quit  date: 06/13/1997    Years since quitting: 21.6  . Smokeless tobacco: Never Used  Substance Use Topics  . Alcohol use: No    Alcohol/week: 0.0 standard drinks  . Drug use: No    ALLERGIES:  is allergic to paclitaxel.  MEDICATIONS:  Current Outpatient Medications  Medication Sig Dispense Refill  . olmesartan-hydrochlorothiazide (BENICAR HCT) 20-12.5 MG tablet TAKE 1 TABLET BY MOUTH ONCE DAILY 90 tablet 1  . pantoprazole (PROTONIX) 40 MG tablet TAKE 1 TABLET BY MOUTH ONCE DAILY 90 tablet 3   No current facility-administered medications for this visit.    Facility-Administered Medications Ordered in Other Visits  Medication Dose Route Frequency Provider Last Rate Last Dose  . sodium chloride 0.9 % 1,000 mL with potassium chloride 20 mEq, magnesium sulfate 2 g infusion   Intravenous Continuous Forest Gleason, MD   Stopped at 12/04/14 1300    PHYSICAL EXAMINATION: ECOG PERFORMANCE STATUS: 0 - Asymptomatic  BP 129/64 (BP Location: Left Arm, Patient Position: Sitting, Cuff Size: Normal)   Pulse 88   Temp 97.6 F (36.4 C) (Tympanic)   Resp 16   Wt 158 lb 3.2 oz (71.8 kg)   BMI 28.02 kg/m   Filed Weights   02/04/19 1044  Weight: 158 lb 3.2 oz (71.8 kg)    Physical Exam  Constitutional: She is oriented to person, place, and time and well-developed, well-nourished, and in no distress.  Accompanied her daughter.  Walking herself  HENT:  Head: Normocephalic and atraumatic.  Mouth/Throat: Oropharynx is clear and moist. No oropharyngeal exudate.  Eyes: Pupils are equal, round, and reactive to light.  Neck: Normal range of motion. Neck supple.  Cardiovascular: Normal rate and regular rhythm.  Pulmonary/Chest: Effort normal and breath sounds normal. No respiratory distress. She has no wheezes.  Abdominal: Soft. Bowel sounds are normal. She exhibits no distension and no mass. There is no abdominal tenderness. There is no rebound and no guarding.  Musculoskeletal: Normal range of motion.         General: No tenderness or edema.  Neurological: She is alert and oriented to person, place, and time.  Skin: Skin is warm.  Psychiatric: Affect normal.     LABORATORY DATA:  I have reviewed the data as listed    Component Value Date/Time   NA 138 02/04/2019 1017   NA 130 (L) 10/09/2014 0846   K 4.1 02/04/2019 1017   K 4.1 10/09/2014 0846   CL 102 02/04/2019 1017   CL 98 (L) 10/09/2014 0846   CO2 26 02/04/2019 1017   CO2 25 10/09/2014 0846   GLUCOSE 136 (H) 02/04/2019 1017   GLUCOSE 161 (H) 10/09/2014 0846   BUN 31 (H) 02/04/2019 1017   BUN 14 10/09/2014 0846   CREATININE 0.99 02/04/2019 1017   CREATININE 0.70 10/09/2014 0846   CALCIUM 10.4 (H) 02/04/2019 1017   CALCIUM 9.1 10/09/2014 0846   PROT 8.5 (H) 02/04/2019 1017   PROT 7.3 10/09/2014 0846   ALBUMIN 4.1 02/04/2019 1017   ALBUMIN 3.6 10/09/2014 0846   AST 18 02/04/2019 1017   AST 16 10/09/2014 0846   ALT 17  02/04/2019 1017   ALT 13 (L) 10/09/2014 0846   ALKPHOS 75 02/04/2019 1017   ALKPHOS 70 10/09/2014 0846   BILITOT 0.7 02/04/2019 1017   BILITOT 1.0 10/09/2014 0846   GFRNONAA 52 (L) 02/04/2019 1017   GFRNONAA >60 10/09/2014 0846   GFRAA 60 (L) 02/04/2019 1017   GFRAA >60 10/09/2014 0846    No results found for: SPEP, UPEP  Lab Results  Component Value Date   WBC 8.2 02/04/2019   NEUTROABS 5.1 02/04/2019   HGB 11.7 (L) 02/04/2019   HCT 35.8 (L) 02/04/2019   MCV 86.7 02/04/2019   PLT 279 02/04/2019      Chemistry      Component Value Date/Time   NA 138 02/04/2019 1017   NA 130 (L) 10/09/2014 0846   K 4.1 02/04/2019 1017   K 4.1 10/09/2014 0846   CL 102 02/04/2019 1017   CL 98 (L) 10/09/2014 0846   CO2 26 02/04/2019 1017   CO2 25 10/09/2014 0846   BUN 31 (H) 02/04/2019 1017   BUN 14 10/09/2014 0846   CREATININE 0.99 02/04/2019 1017   CREATININE 0.70 10/09/2014 0846   GLU 111 03/18/2014 1048      Component Value Date/Time   CALCIUM 10.4 (H) 02/04/2019 1017   CALCIUM 9.1 10/09/2014  0846   ALKPHOS 75 02/04/2019 1017   ALKPHOS 70 10/09/2014 0846   AST 18 02/04/2019 1017   AST 16 10/09/2014 0846   ALT 17 02/04/2019 1017   ALT 13 (L) 10/09/2014 0846   BILITOT 0.7 02/04/2019 1017   BILITOT 1.0 10/09/2014 0846       RADIOGRAPHIC STUDIES: I have personally reviewed the radiological images as listed and agreed with the findings in the report. No results found.   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  Currently on surveillance.  Stable  #January 28, 2019-CT scan stable right paramediastinal radiation changes.  Clinically, no evidence of recurrence.  Continue surveillance-we will do scans on annual basis/patient family agreement.  # hemoglobin slightly low at 11.7 hb- recommend po iron/day; q o day; check iron stuides; coloscnoscopy in nov 2018/Dr.Elliot.  #Discussed with the patient's daughter, [name].  # DISPOSITION: add iron studies/ferrtin # Follow up in 6 months- MD; labs-cbc/cmp;-Dr.B  # I reviewed the blood work- with the patient in detail; also reviewed the imaging independently [as summarized above]; and with the patient in detail.     Orders Placed This Encounter  Procedures  . Ferritin    Standing Status:   Future    Standing Expiration Date:   02/04/2020  . Iron and TIBC    Standing Status:   Future    Standing Expiration Date:   02/04/2020   All questions were answered. The patient knows to call the clinic with any problems, questions or concerns.      Cammie Sickle, MD 02/04/2019 12:54 PM

## 2019-02-11 ENCOUNTER — Ambulatory Visit: Payer: Medicare Other

## 2019-03-26 ENCOUNTER — Telehealth: Payer: Self-pay

## 2019-03-26 NOTE — Telephone Encounter (Signed)
Copied from [place] [id number]. Topic: General - Inquiry >> Mar 26, 2019  2:16 PM Heather Cooper, [place] wrote: Reason for CRM: Patient called in stating she would like to have labs done prior to appointment on 11/24. Patient is wanting a normal blood panel done, iron, and thyroid. Please advise.

## 2019-03-26 NOTE — Telephone Encounter (Signed)
Please make sure orders are placed or if she needs labs

## 2019-03-27 NOTE — Telephone Encounter (Signed)
She will need fasting labs prior to her appointment on 11/24. All of the orders are in future.

## 2019-03-27 NOTE — Telephone Encounter (Signed)
Pt scheduled  

## 2019-04-30 ENCOUNTER — Other Ambulatory Visit: Payer: Self-pay

## 2019-05-02 ENCOUNTER — Other Ambulatory Visit (INDEPENDENT_AMBULATORY_CARE_PROVIDER_SITE_OTHER): Payer: Medicare Other

## 2019-05-02 ENCOUNTER — Other Ambulatory Visit: Payer: Self-pay

## 2019-05-02 DIAGNOSIS — R739 Hyperglycemia, unspecified: Secondary | ICD-10-CM | POA: Diagnosis not present

## 2019-05-02 DIAGNOSIS — D649 Anemia, unspecified: Secondary | ICD-10-CM | POA: Diagnosis not present

## 2019-05-02 DIAGNOSIS — I10 Essential (primary) hypertension: Secondary | ICD-10-CM | POA: Diagnosis not present

## 2019-05-02 DIAGNOSIS — E7800 Pure hypercholesterolemia, unspecified: Secondary | ICD-10-CM

## 2019-05-02 LAB — LIPID PANEL
Cholesterol: 179 mg/dL (ref 0–200)
HDL: 39.3 mg/dL (ref >39.00)
LDL Cholesterol: 107 mg/dL — ABNORMAL HIGH (ref 0–99)
NonHDL: 139.8
Total CHOL/HDL Ratio: 5
Triglycerides: 164 mg/dL — ABNORMAL HIGH (ref 0.0–149.0)
VLDL: 32.8 mg/dL (ref 0.0–40.0)

## 2019-05-02 LAB — CBC WITH DIFFERENTIAL/PLATELET
Basophils Absolute: 0.1 10*3/uL (ref 0.0–0.1)
Basophils Relative: 0.7 % (ref 0.0–3.0)
Eosinophils Absolute: 0.2 10*3/uL (ref 0.0–0.7)
Eosinophils Relative: 2.5 % (ref 0.0–5.0)
HCT: 37.2 % (ref 36.0–46.0)
Hemoglobin: 12.3 g/dL (ref 12.0–15.0)
Lymphocytes Relative: 25.1 % (ref 12.0–46.0)
Lymphs Abs: 2 10*3/uL (ref 0.7–4.0)
MCHC: 33.2 g/dL (ref 30.0–36.0)
MCV: 87.3 fl (ref 78.0–100.0)
Monocytes Absolute: 0.6 10*3/uL (ref 0.1–1.0)
Monocytes Relative: 7.5 % (ref 3.0–12.0)
Neutro Abs: 5.1 10*3/uL (ref 1.4–7.7)
Neutrophils Relative %: 64.2 % (ref 43.0–77.0)
Platelets: 271 10*3/uL (ref 150.0–400.0)
RBC: 4.26 Mil/uL (ref 3.87–5.11)
RDW: 14 % (ref 11.5–15.5)
WBC: 8 10*3/uL (ref 4.0–10.5)

## 2019-05-02 LAB — HEPATIC FUNCTION PANEL
ALT: 14 U/L (ref 0–35)
AST: 15 U/L (ref 0–37)
Albumin: 4.2 g/dL (ref 3.5–5.2)
Alkaline Phosphatase: 72 U/L (ref 39–117)
Bilirubin, Direct: 0.1 mg/dL (ref 0.0–0.3)
Total Bilirubin: 0.5 mg/dL (ref 0.2–1.2)
Total Protein: 7.5 g/dL (ref 6.0–8.3)

## 2019-05-02 LAB — BASIC METABOLIC PANEL
BUN: 20 mg/dL (ref 6–23)
CO2: 27 mEq/L (ref 19–32)
Calcium: 10.2 mg/dL (ref 8.4–10.5)
Chloride: 103 mEq/L (ref 96–112)
Creatinine, Ser: 0.97 mg/dL (ref 0.40–1.20)
GFR: 65.83 mL/min (ref >60.00)
Glucose, Bld: 133 mg/dL — ABNORMAL HIGH (ref 70–99)
Potassium: 4 mEq/L (ref 3.5–5.1)
Sodium: 140 mEq/L (ref 135–145)

## 2019-05-02 LAB — HEMOGLOBIN A1C: Hgb A1c MFr Bld: 6.6 % — ABNORMAL HIGH (ref 4.6–6.5)

## 2019-05-02 LAB — FERRITIN: Ferritin: 25 ng/mL (ref 10.0–291.0)

## 2019-05-02 LAB — TSH: TSH: 0.72 u[IU]/mL (ref 0.35–4.50)

## 2019-05-04 ENCOUNTER — Encounter: Payer: Self-pay | Admitting: Internal Medicine

## 2019-05-06 ENCOUNTER — Other Ambulatory Visit: Payer: Self-pay

## 2019-05-06 ENCOUNTER — Ambulatory Visit (INDEPENDENT_AMBULATORY_CARE_PROVIDER_SITE_OTHER): Payer: Medicare Other | Admitting: Internal Medicine

## 2019-05-06 VITALS — BP 130/70 | HR 96 | Temp 96.7°F | Resp 16 | Ht 63.0 in | Wt 161.4 lb

## 2019-05-06 DIAGNOSIS — I10 Essential (primary) hypertension: Secondary | ICD-10-CM

## 2019-05-06 DIAGNOSIS — K219 Gastro-esophageal reflux disease without esophagitis: Secondary | ICD-10-CM | POA: Diagnosis not present

## 2019-05-06 DIAGNOSIS — E7800 Pure hypercholesterolemia, unspecified: Secondary | ICD-10-CM

## 2019-05-06 DIAGNOSIS — Z0000 Encounter for general adult medical examination without abnormal findings: Secondary | ICD-10-CM

## 2019-05-06 DIAGNOSIS — I714 Abdominal aortic aneurysm, without rupture, unspecified: Secondary | ICD-10-CM

## 2019-05-06 DIAGNOSIS — Z23 Encounter for immunization: Secondary | ICD-10-CM | POA: Diagnosis not present

## 2019-05-06 DIAGNOSIS — R739 Hyperglycemia, unspecified: Secondary | ICD-10-CM

## 2019-05-06 DIAGNOSIS — Z85118 Personal history of other malignant neoplasm of bronchus and lung: Secondary | ICD-10-CM

## 2019-05-06 NOTE — Progress Notes (Signed)
Patient ID: Heather Cooper, female   DOB: 06/24/1930, 85 y.o.   MRN: [id number]   Subjective:    Patient ID: Heather Cooper, female    DOB: 08/23/1930, 85 y.o.   MRN: [id number]  HPI This visit occurred during the SARS-CoV-2 public health emergency.  Safety protocols were in place, including screening questions prior to the visit, additional usage of staff PPE, and extensive cleaning of exam room while observing appropriate contact time as indicated for disinfecting solutions.  Patient here for her physical exam.  She is doing well.  Handling stress well.  Staying in due to covid restrictions.  No cough or congestion. No sob.  No chest pain.  No acid reflux.  No abdominal pain. Bowels moving.  Just saw oncology.  LUL lung nodule s/p chemo/rad.  No evidence of recurrence.  Recommended f/u in 6 months.  No acid reflux.  Discussed labs.     Past Medical History:  Diagnosis Date  . Chicken pox   . Hypercalcemia    familial hypocalciuric hypercalcemia  . Hypercholesterolemia   . Hypertension   . Lung cancer (HCC)   . Osteoporosis   . Thyroid disease    Past Surgical History:  Procedure Laterality Date  . ABDOMINAL HYSTERECTOMY     partial  . CHOLECYSTECTOMY    . COLONOSCOPY WITH PROPOFOL N/A 04/17/2017   Procedure: COLONOSCOPY WITH PROPOFOL;  Surgeon: Elliott, Robert T, MD;  Location: [facility] ENDOSCOPY;  Service: Endoscopy;  Laterality: N/A;  . transvaginal hysterectomy  04/18/05   with anterior colporrhaphy   Family History  Problem Relation Age of Onset  . Stroke Mother   . Hypertension Mother   . Prostate cancer Father   . Cancer Father        prostate  . Heart disease Brother        s/p CABG  . Colon cancer Neg Hx    Social History   Socioeconomic History  . Marital status: Widowed    Spouse name: Not on file  . Number of children: 3  . Years of education: Not on file  . Highest education level: Not on file  Occupational History  . Not on file  Social Needs  .  Financial resource strain: Not hard at all  . Food insecurity    Worry: Never true    Inability: Never true  . Transportation needs    Medical: No    Non-medical: No  Tobacco Use  . Smoking status: Former Smoker    Quit date: 06/13/1997    Years since quitting: 21.9  . Smokeless tobacco: Never Used  Substance and Sexual Activity  . Alcohol use: No    Alcohol/week: 0.0 standard drinks  . Drug use: No  . Sexual activity: Not on file  Lifestyle  . Physical activity    Days per week: 0 days    Minutes per session: Not on file  . Stress: Not at all  Relationships  . Social connections    Talks on phone: Not on file    Gets together: Not on file    Attends religious service: Not on file    Active member of club or organization: Not on file    Attends meetings of clubs or organizations: Not on file    Relationship status: Not on file  Other Topics Concern  . Not on file  Social History Narrative   Lost her husband November 2019    Outpatient Encounter Medications as of 05/06/2019  Medication   Sig  . olmesartan-hydrochlorothiazide (BENICAR HCT) 20-12.5 MG tablet TAKE 1 TABLET BY MOUTH ONCE DAILY  . pantoprazole (PROTONIX) 40 MG tablet TAKE 1 TABLET BY MOUTH ONCE DAILY   Facility-Administered Encounter Medications as of 05/06/2019  Medication  . sodium chloride 0.9 % 1,000 mL with potassium chloride 20 mEq, magnesium sulfate 2 g infusion    Review of Systems  Constitutional: Negative for appetite change and unexpected weight change.  HENT: Negative for congestion and sinus pressure.   Eyes: Negative for pain and visual disturbance.  Respiratory: Negative for cough, chest tightness and shortness of breath.   Cardiovascular: Negative for chest pain, palpitations and leg swelling.  Gastrointestinal: Negative for abdominal pain, diarrhea, nausea and vomiting.  Genitourinary: Negative for difficulty urinating and dysuria.  Musculoskeletal: Negative for joint swelling and  myalgias.  Skin: Negative for color change and rash.  Neurological: Negative for dizziness, light-headedness and headaches.  Hematological: Negative for adenopathy. Does not bruise/bleed easily.  Psychiatric/Behavioral: Negative for agitation and dysphoric mood.       Objective:    Physical Exam Constitutional:      General: She is not in acute distress.    Appearance: Normal appearance. She is well-developed.  HENT:     Head: Normocephalic and atraumatic.     Right Ear: External ear normal.     Left Ear: External ear normal.  Eyes:     General: No scleral icterus.       Right eye: No discharge.        Left eye: No discharge.     Conjunctiva/sclera: Conjunctivae normal.  Neck:     Musculoskeletal: Neck supple. No muscular tenderness.     Thyroid: No thyromegaly.  Cardiovascular:     Rate and Rhythm: Normal rate and regular rhythm.  Pulmonary:     Effort: No tachypnea, accessory muscle usage or respiratory distress.     Breath sounds: Normal breath sounds. No decreased breath sounds or wheezing.  Chest:     Breasts:        Right: No inverted nipple, mass, nipple discharge or tenderness (no axillary adenopathy).        Left: No inverted nipple, mass, nipple discharge or tenderness (no axilarry adenopathy).  Abdominal:     General: Bowel sounds are normal.     Palpations: Abdomen is soft.     Tenderness: There is no abdominal tenderness.  Musculoskeletal:        General: No swelling or tenderness.  Lymphadenopathy:     Cervical: No cervical adenopathy.  Skin:    Findings: No erythema or rash.  Neurological:     Mental Status: She is alert and oriented to person, place, and time.  Psychiatric:        Mood and Affect: Mood normal.        Behavior: Behavior normal.     BP 130/70   Pulse 96   Temp (!) 96.7 F (35.9 C)   Resp 16   Ht 5' 3" (1.6 m)   Wt 161 lb 6.4 oz (73.2 kg)   SpO2 97%   BMI 28.59 kg/m  Wt Readings from Last 3 Encounters:  05/06/19 161 lb 6.4  oz (73.2 kg)  02/04/19 158 lb 3.2 oz (71.8 kg)  01/04/19 155 lb (70.3 kg)     Lab Results  Component Value Date   WBC 8.0 05/02/2019   HGB 12.3 05/02/2019   HCT 37.2 05/02/2019   PLT 271.0 05/02/2019   GLUCOSE 133 (H) 05/02/2019     CHOL 179 05/02/2019   TRIG 164.0 (H) 05/02/2019   HDL 39.30 05/02/2019   LDLCALC 107 (H) 05/02/2019   ALT 14 05/02/2019   AST 15 05/02/2019   NA 140 05/02/2019   K 4.0 05/02/2019   CL 103 05/02/2019   CREATININE 0.97 05/02/2019   BUN 20 05/02/2019   CO2 27 05/02/2019   TSH 0.72 05/02/2019   INR 1.0 03/18/2014   INR 1.0 03/18/2014   HGBA1C 6.6 (H) 05/02/2019    Ct Chest Wo Contrast  Result Date: 01/28/2019 CLINICAL DATA:  Restaging.  Lung cancer. EXAM: CT CHEST WITHOUT CONTRAST TECHNIQUE: Multidetector CT imaging of the chest was performed following the standard protocol without IV contrast. COMPARISON:  07/03/2018 FINDINGS: Cardiovascular: Normal heart size. Aortic atherosclerosis. The left main, lad, left circumflex and RCA coronary artery calcifications. Mediastinum/Nodes: Normal appearance of the thyroid gland. The trachea appears patent and is midline. Normal appearance of the esophagus. No mediastinal or hilar adenopathy. Lungs/Pleura: No pleural effusion. Centrilobular emphysema. Paramediastinal radiation change identified bilaterally. The appearance is stable from the previous exam. No suspicious pulmonary nodule or mass identified. Upper Abdomen: No acute abnormality. Musculoskeletal: No chest wall mass or suspicious bone lesions identified. IMPRESSION: 1. Stable appearance of the chest. No evidence of new or progressive malignancy. 2. Paramediastinal radiation change within both lungs is similar to previous exam. 3. Aortic Atherosclerosis (ICD10-I70.0) and Emphysema (ICD10-J43.9). 4. Coronary artery calcifications. Electronically Signed   By: Taylor  Stroud M.D.   On: 01/28/2019 10:31       Assessment & Plan:   Problem List Items Addressed  This Visit    Abdominal aortic aneurysm (HCC)    Evaluated by Dr Schnier.  Stable.  Evaluated 01/15/18.  Recommended f/u in 24 months.        GERD (gastroesophageal reflux disease)    Controlled.       Health care maintenance    Physical today 05/06/19.  Discussed mammogram.  She wants to hold on mammogram at this time.        History of lung cancer    Seeing Dr Brahmanaday.  Recently evaluated.  Stable.  Recommended f/u in 6 months.        Hypercalcemia    No longer followed by Dr South.  Recheck calcium.       Hypercholesterolemia    Low cholesterol diet and exercise.  Follow lipid panel.       Relevant Orders   Lipid panel   Comprehensive metabolic panel   Hyperglycemia    Low carb diet and exercise.  Follow met b and a1c.       Hypertension    Blood pressure under good control.  Continue same medication regimen.  Follow pressures.  Follow metabolic panel.         Other Visit Diagnoses    Need for vaccination with 13-polyvalent pneumococcal conjugate vaccine    -  Primary   Relevant Orders   Pneumococcal conjugate vaccine 13-valent (Completed)       Charlene Scott, MD 

## 2019-05-06 NOTE — Assessment & Plan Note (Addendum)
Physical today 05/06/19.  Discussed mammogram.  She wants to hold on mammogram at this time.

## 2019-05-07 ENCOUNTER — Ambulatory Visit: Payer: Medicare Other | Admitting: Internal Medicine

## 2019-05-09 ENCOUNTER — Encounter: Payer: Self-pay | Admitting: Internal Medicine

## 2019-05-09 NOTE — Assessment & Plan Note (Signed)
Seeing Dr Starleen Arms.  Recently evaluated.  Stable.  Recommended f/u in 6 months.

## 2019-05-09 NOTE — Assessment & Plan Note (Signed)
Low cholesterol diet and exercise.  Follow lipid panel.   

## 2019-05-09 NOTE — Assessment & Plan Note (Signed)
Blood pressure under good control.  Continue same medication regimen.  Follow pressures.  Follow metabolic panel.   

## 2019-05-09 NOTE — Assessment & Plan Note (Signed)
Low carb diet and exercise.  Follow met b and a1c.  

## 2019-05-09 NOTE — Assessment & Plan Note (Signed)
Evaluated by Dr Delana Meyer.  Stable.  Evaluated 01/15/18.  Recommended f/u in 24 months.

## 2019-05-09 NOTE — Assessment & Plan Note (Signed)
Controlled.  

## 2019-05-09 NOTE — Assessment & Plan Note (Signed)
No longer followed by Dr Forde Dandy.  Recheck calcium.

## 2019-06-19 ENCOUNTER — Telehealth: Payer: Self-pay | Admitting: Internal Medicine

## 2019-06-19 ENCOUNTER — Telehealth: Payer: Self-pay | Admitting: *Deleted

## 2019-06-19 NOTE — Telephone Encounter (Signed)
Pt called about having some questions about the covid vaccine. Please advise and Thank you!  Call pt @ [phone].

## 2019-06-19 NOTE — Telephone Encounter (Signed)
Patient called asking if she should take the COVID vaccine

## 2019-06-20 NOTE — Telephone Encounter (Signed)
Patient informed of doctor response. She said she is going to go get vaccine on Monday

## 2019-06-20 NOTE — Telephone Encounter (Signed)
[place] with vaccine- I think the benefits of the vaccination outweigh the potential risks. Please inform pt. Thanks GB

## 2019-06-21 NOTE — Telephone Encounter (Signed)
Pt discussed with oncology. Gave ok to get vaccine.

## 2019-06-23 NOTE — Telephone Encounter (Signed)
Called pt and questions answered.

## 2019-07-03 ENCOUNTER — Encounter (INDEPENDENT_AMBULATORY_CARE_PROVIDER_SITE_OTHER): Payer: Medicare Other | Admitting: Ophthalmology

## 2019-07-19 ENCOUNTER — Other Ambulatory Visit: Payer: Self-pay | Admitting: Internal Medicine

## 2019-07-31 ENCOUNTER — Encounter (INDEPENDENT_AMBULATORY_CARE_PROVIDER_SITE_OTHER): Payer: Medicare PPO | Admitting: Ophthalmology

## 2019-07-31 ENCOUNTER — Other Ambulatory Visit: Payer: Self-pay

## 2019-07-31 DIAGNOSIS — H35033 Hypertensive retinopathy, bilateral: Secondary | ICD-10-CM

## 2019-07-31 DIAGNOSIS — I10 Essential (primary) hypertension: Secondary | ICD-10-CM | POA: Diagnosis not present

## 2019-07-31 DIAGNOSIS — H353132 Nonexudative age-related macular degeneration, bilateral, intermediate dry stage: Secondary | ICD-10-CM | POA: Diagnosis not present

## 2019-07-31 DIAGNOSIS — H43813 Vitreous degeneration, bilateral: Secondary | ICD-10-CM | POA: Diagnosis not present

## 2019-08-07 ENCOUNTER — Inpatient Hospital Stay: Payer: Medicare PPO | Attending: Internal Medicine

## 2019-08-07 ENCOUNTER — Other Ambulatory Visit: Payer: Self-pay | Admitting: *Deleted

## 2019-08-07 ENCOUNTER — Inpatient Hospital Stay: Payer: Medicare PPO | Attending: Internal Medicine | Admitting: Internal Medicine

## 2019-08-07 ENCOUNTER — Other Ambulatory Visit: Payer: Self-pay

## 2019-08-07 DIAGNOSIS — C3411 Malignant neoplasm of upper lobe, right bronchus or lung: Secondary | ICD-10-CM | POA: Insufficient documentation

## 2019-08-07 DIAGNOSIS — D649 Anemia, unspecified: Secondary | ICD-10-CM | POA: Diagnosis not present

## 2019-08-07 DIAGNOSIS — Z87891 Personal history of nicotine dependence: Secondary | ICD-10-CM | POA: Insufficient documentation

## 2019-08-07 DIAGNOSIS — I10 Essential (primary) hypertension: Secondary | ICD-10-CM | POA: Insufficient documentation

## 2019-08-07 DIAGNOSIS — E079 Disorder of thyroid, unspecified: Secondary | ICD-10-CM | POA: Diagnosis not present

## 2019-08-07 LAB — CBC WITH DIFFERENTIAL/PLATELET
Abs Immature Granulocytes: 0.04 10*3/uL (ref 0.00–0.07)
Basophils Absolute: 0 10*3/uL (ref 0.0–0.1)
Basophils Relative: 0 %
Eosinophils Absolute: 0.1 10*3/uL (ref 0.0–0.5)
Eosinophils Relative: 1 %
HCT: 38.1 % (ref 36.0–46.0)
Hemoglobin: 12.5 g/dL (ref 12.0–15.0)
Immature Granulocytes: 0 %
Lymphocytes Relative: 25 %
Lymphs Abs: 2.3 10*3/uL (ref 0.7–4.0)
MCH: 29.1 pg (ref 26.0–34.0)
MCHC: 32.8 g/dL (ref 30.0–36.0)
MCV: 88.8 fL (ref 80.0–100.0)
Monocytes Absolute: 0.6 10*3/uL (ref 0.1–1.0)
Monocytes Relative: 7 %
Neutro Abs: 6 10*3/uL (ref 1.7–7.7)
Neutrophils Relative %: 67 %
Platelets: 270 10*3/uL (ref 150–400)
RBC: 4.29 MIL/uL (ref 3.87–5.11)
RDW: 13 % (ref 11.5–15.5)
WBC: 9.1 10*3/uL (ref 4.0–10.5)
nRBC: 0 % (ref 0.0–0.2)

## 2019-08-07 LAB — COMPREHENSIVE METABOLIC PANEL
ALT: 19 U/L (ref 0–44)
AST: 20 U/L (ref 15–41)
Albumin: 4.1 g/dL (ref 3.5–5.0)
Alkaline Phosphatase: 73 U/L (ref 38–126)
Anion gap: 10 (ref 5–15)
BUN: 26 mg/dL — ABNORMAL HIGH (ref 8–23)
CO2: 26 mmol/L (ref 22–32)
Calcium: 9.8 mg/dL (ref 8.9–10.3)
Chloride: 99 mmol/L (ref 98–111)
Creatinine, Ser: 0.98 mg/dL (ref 0.44–1.00)
GFR calc Af Amer: >60 mL/min (ref >60)
GFR calc non Af Amer: 52 mL/min — ABNORMAL LOW (ref >60)
Glucose, Bld: 153 mg/dL — ABNORMAL HIGH (ref 70–99)
Potassium: 4.1 mmol/L (ref 3.5–5.1)
Sodium: 135 mmol/L (ref 135–145)
Total Bilirubin: 0.5 mg/dL (ref 0.3–1.2)
Total Protein: 8.4 g/dL — ABNORMAL HIGH (ref 6.5–8.1)

## 2019-08-07 LAB — IRON AND TIBC
Iron: 142 ug/dL (ref 28–170)
Saturation Ratios: 37 % — ABNORMAL HIGH (ref 10.4–31.8)
TIBC: 384 ug/dL (ref 250–450)
UIBC: 242 ug/dL

## 2019-08-07 LAB — FERRITIN: Ferritin: 31 ng/mL (ref 11–307)

## 2019-08-07 NOTE — Assessment & Plan Note (Signed)
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  Currently on surveillance. STABLE. Will plan imagning annually.   # Mild  Anemia- improved- Hb 12.5; continue PO iron every other day.   # DISPOSITION:  # Follow up in 6 months- MD; labs-cbc/cmp;CT chest prior- -Dr.B

## 2019-08-07 NOTE — Progress Notes (Signed)
[place] OFFICE PROGRESS NOTE  Patient Care Team: [name], MD as PCP - General (Internal Medicine)  Cancer Staging No matching staging information was found for the patient.   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  Starting radiation and chemotherapy from April 14, 2014 Patient was started on carboplatinum andTaxol Herve were developed an allergic reaction to Taxol so would be changed to Abraxane 3.patient has finished 6 cycles of weekly chemotherapy with carboplatinum  and radiation therapy(May 28, 2014) 4.started on  NIVOLULAMAB because of persistent disease July 02, 2014. 5.  NIVOLULAMAB was discontinued because of persistent diarrhea in July of 2016.  August of 2016 CT scan was stable so no further chemotherapy  # AUG 25th PET- STABLE RUL MASS [radiation fibrosis; <1cm ? Mediastinal recurrence];   # AAA/ 3.3x 3.9 Stable [Dr.Schnier] -----------------------------------------------------   DIAGNOSIS: Lung cancer  STAGE:  IV-NED       ;GOALS: control  CURRENT/MOST RECENT THERAPY : surveillance    History of lung cancer  Malignant neoplasm of right upper lobe of lung ([place])  08/07/2019 Initial Diagnosis   Malignant neoplasm of right upper lobe of lung ([place])      INTERVAL HISTORY:  Heather Cooper 86 y.o.  female pleasant patient above history of Right upper lobe adenoca cell Stage IV lung cancer [contralateral lower lobe lung nodule]- currently on surveillance is here for follow-up.  Patient is doing well.  Denies any new shortness of breath.  Complains of mild cough at  nighttime.  Not getting any worse.  Appetite is good.  No weight loss.  No blood in stools no black or stools.  Review of Systems  Constitutional: Negative for chills, diaphoresis, fever, malaise/fatigue and weight loss.  HENT: Negative for nosebleeds and sore throat.   Eyes: Negative for double vision.  Respiratory: Positive for cough. Negative for hemoptysis, sputum production, shortness of breath and wheezing.   Cardiovascular: Negative for chest pain, palpitations, orthopnea and leg swelling.  Gastrointestinal: Negative for abdominal pain, blood in stool, constipation, diarrhea, heartburn, melena, nausea and vomiting.  Genitourinary: Negative for dysuria, frequency and urgency.  Musculoskeletal: Negative for back pain and joint pain.  Skin: Negative.  Negative for itching and rash.  Neurological: Negative for dizziness, tingling, focal weakness, weakness and headaches.  Endo/Heme/Allergies: Does not bruise/bleed easily.  Psychiatric/Behavioral: Negative for depression. The patient is not nervous/anxious and does not have insomnia.      PAST MEDICAL HISTORY :  Past Medical History:  Diagnosis Date  . Chicken pox   . Hypercalcemia    familial hypocalciuric hypercalcemia  . Hypercholesterolemia   . Hypertension   . Lung cancer ([place])   . Osteoporosis   . Thyroid disease     PAST SURGICAL HISTORY :   Past Surgical History:  Procedure Laterality Date  . ABDOMINAL HYSTERECTOMY     partial  . CHOLECYSTECTOMY    . COLONOSCOPY WITH PROPOFOL N/A 04/17/2017   Procedure: COLONOSCOPY WITH PROPOFOL;  Surgeon: Manya Silvas, MD;  Location: [facility] ENDOSCOPY;  Service: Endoscopy;  Laterality: N/A;  . transvaginal hysterectomy  04/18/05   with anterior colporrhaphy  FAMILY HISTORY :   Family History  Problem Relation Age of Onset  . Stroke Mother   . Hypertension Mother   . Prostate cancer Father   . Cancer Father        prostate  . Heart disease Brother        s/p CABG  .  Colon cancer Neg Hx     SOCIAL HISTORY:   Social History   Tobacco Use  . Smoking status: Former Smoker    Quit date: 06/13/1997    Years since quitting: 22.1  . Smokeless tobacco: Never Used  Substance Use Topics  . Alcohol use: No    Alcohol/week: 0.0 standard drinks  . Drug use: No    ALLERGIES:  is allergic to paclitaxel.  MEDICATIONS:  Current Outpatient Medications  Medication Sig Dispense Refill  . Ascorbic Acid (VITAMIN C) 1000 MG tablet Take 1,000 mg by mouth daily.    [name] olmesartan-hydrochlorothiazide (BENICAR HCT) 20-12.5 MG tablet TAKE 1 TABLET BY MOUTH ONCE DAILY 90 tablet 1  . pantoprazole (PROTONIX) 40 MG tablet TAKE 1 TABLET BY MOUTH ONCE DAILY 90 tablet 3   No current facility-administered medications for this visit.   Facility-Administered Medications Ordered in Other Visits  Medication Dose Route Frequency Provider Last Rate Last Admin  . sodium chloride 0.9 % 1,000 mL with potassium chloride 20 mEq, magnesium sulfate 2 g infusion   Intravenous Continuous Forest Gleason, MD   Stopped at 12/04/14 1300    PHYSICAL EXAMINATION: ECOG PERFORMANCE STATUS: 0 - Asymptomatic  BP 131/66 (BP Location: Left Arm, Patient Position: Sitting, Cuff Size: Normal)   Pulse 89   Temp 98.3 F (36.8 C) (Tympanic)   Wt 160 lb 8 oz (72.8 kg)   BMI 28.43 kg/m   Filed Weights   08/07/19 1036  Weight: 160 lb 8 oz (72.8 kg)    Physical Exam  Constitutional: She is oriented to person, place, and time and well-developed, well-nourished, and in no distress.  Accompanied her daughter.  Walking herself  HENT:  Head: Normocephalic and atraumatic.  Mouth/Throat: Oropharynx is clear and moist. No oropharyngeal exudate.  Eyes: Pupils are equal, round, and reactive to light.  Cardiovascular: Normal rate and regular rhythm.  Pulmonary/Chest: Effort normal and breath sounds normal. No respiratory distress. She has no wheezes.  Abdominal: Soft. Bowel sounds are normal. She exhibits no  distension and no mass. There is no abdominal tenderness. There is no rebound and no guarding.  Musculoskeletal:        General: No tenderness or edema. Normal range of motion.     Cervical back: Normal range of motion and neck supple.  Neurological: She is alert and oriented to person, place, and time.  Skin: Skin is warm.  Psychiatric: Affect normal.     LABORATORY DATA:  I have reviewed the data as listed    Component Value Date/Time   NA 135 08/07/2019 1024   NA 130 (L) 10/09/2014 0846   K 4.1 08/07/2019 1024   K 4.1 10/09/2014 0846   CL 99 08/07/2019 1024   CL 98 (L) 10/09/2014 0846   CO2 26 08/07/2019 1024   CO2 25 10/09/2014 0846   GLUCOSE 153 (H) 08/07/2019 1024   GLUCOSE 161 (H) 10/09/2014 0846   BUN 26 (H) 08/07/2019 1024   BUN 14 10/09/2014 0846   CREATININE 0.98 08/07/2019 1024   CREATININE 0.70 10/09/2014 0846   CALCIUM 9.8 08/07/2019 1024   CALCIUM 9.1 10/09/2014 0846   PROT 8.4 (  H) 08/07/2019 1024   PROT 7.3 10/09/2014 0846   ALBUMIN 4.1 08/07/2019 1024   ALBUMIN 3.6 10/09/2014 0846   AST 20 08/07/2019 1024   AST 16 10/09/2014 0846   ALT 19 08/07/2019 1024   ALT 13 (L) 10/09/2014 0846   ALKPHOS 73 08/07/2019 1024   ALKPHOS 70 10/09/2014 0846   BILITOT 0.5 08/07/2019 1024   BILITOT 1.0 10/09/2014 0846   GFRNONAA 52 (L) 08/07/2019 1024   GFRNONAA >60 10/09/2014 0846   GFRAA >60 08/07/2019 1024   GFRAA >60 10/09/2014 0846    No results found for: SPEP, UPEP  Lab Results  Component Value Date   WBC 9.1 08/07/2019   NEUTROABS 6.0 08/07/2019   HGB 12.5 08/07/2019   HCT 38.1 08/07/2019   MCV 88.8 08/07/2019   PLT 270 08/07/2019      Chemistry      Component Value Date/Time   NA 135 08/07/2019 1024   NA 130 (L) 10/09/2014 0846   K 4.1 08/07/2019 1024   K 4.1 10/09/2014 0846   CL 99 08/07/2019 1024   CL 98 (L) 10/09/2014 0846   CO2 26 08/07/2019 1024   CO2 25 10/09/2014 0846   BUN 26 (H) 08/07/2019 1024   BUN 14 10/09/2014 0846    CREATININE 0.98 08/07/2019 1024   CREATININE 0.70 10/09/2014 0846   GLU 111 03/18/2014 1048      Component Value Date/Time   CALCIUM 9.8 08/07/2019 1024   CALCIUM 9.1 10/09/2014 0846   ALKPHOS 73 08/07/2019 1024   ALKPHOS 70 10/09/2014 0846   AST 20 08/07/2019 1024   AST 16 10/09/2014 0846   ALT 19 08/07/2019 1024   ALT 13 (L) 10/09/2014 0846   BILITOT 0.5 08/07/2019 1024   BILITOT 1.0 10/09/2014 0846       RADIOGRAPHIC STUDIES: I have personally reviewed the radiological images as listed and agreed with the findings in the report. No results found.   ASSESSMENT & PLAN:  Malignant neoplasm of right upper lobe of lung (HCC) Right upper lobe lung cancer adeno ca- Stage IV [contralateral left lower lobe lung nodule] status post chemoradiation-s/p Nivo.  Currently on surveillance. STABLE. Will plan imagning annually.   # Mild  Anemia- improved- Hb 12.5; continue PO iron every other day.   # DISPOSITION:  # Follow up in 6 months- MD; labs-cbc/cmp;CT chest prior- -Dr.B     Orders Placed This Encounter  Procedures  . CT CHEST WO CONTRAST    Standing Status:   Future    Standing Expiration Date:   08/06/2020    Order Specific Question:   Preferred imaging location?    Answer:   [facility]    Order Specific Question:   Radiology Contrast Protocol - do NOT remove file path    Answer:   [email or web address]    Order Specific Question:   ** REASON FOR EXAM (FREE TEXT)    Answer:   lung cancer  . CBC with Differential    Standing Status:   Future    Standing Expiration Date:   08/06/2020  . Comprehensive metabolic panel    Standing Status:   Future    Standing Expiration Date:   08/06/2020   All questions were answered. The patient knows to call the clinic with any problems, questions or concerns.      Cammie Sickle, MD 08/07/2019 11:15 AM

## 2019-10-19 ENCOUNTER — Other Ambulatory Visit: Payer: Self-pay | Admitting: Internal Medicine

## 2019-11-04 ENCOUNTER — Other Ambulatory Visit: Payer: Self-pay

## 2019-11-04 ENCOUNTER — Other Ambulatory Visit (INDEPENDENT_AMBULATORY_CARE_PROVIDER_SITE_OTHER): Payer: Medicare PPO

## 2019-11-04 DIAGNOSIS — E7800 Pure hypercholesterolemia, unspecified: Secondary | ICD-10-CM | POA: Diagnosis not present

## 2019-11-04 LAB — COMPREHENSIVE METABOLIC PANEL
ALT: 13 U/L (ref 0–35)
AST: 15 U/L (ref 0–37)
Albumin: 4.2 g/dL (ref 3.5–5.2)
Alkaline Phosphatase: 72 U/L (ref 39–117)
BUN: 26 mg/dL — ABNORMAL HIGH (ref 6–23)
CO2: 29 mEq/L (ref 19–32)
Calcium: 10 mg/dL (ref 8.4–10.5)
Chloride: 102 mEq/L (ref 96–112)
Creatinine, Ser: 1.08 mg/dL (ref 0.40–1.20)
GFR: 58.09 mL/min — ABNORMAL LOW (ref >60.00)
Glucose, Bld: 118 mg/dL — ABNORMAL HIGH (ref 70–99)
Potassium: 4.2 mEq/L (ref 3.5–5.1)
Sodium: 139 mEq/L (ref 135–145)
Total Bilirubin: 0.4 mg/dL (ref 0.2–1.2)
Total Protein: 7.1 g/dL (ref 6.0–8.3)

## 2019-11-04 LAB — LIPID PANEL
Cholesterol: 168 mg/dL (ref 0–200)
HDL: 41.1 mg/dL (ref >39.00)
LDL Cholesterol: 102 mg/dL — ABNORMAL HIGH (ref 0–99)
NonHDL: 126.4
Total CHOL/HDL Ratio: 4
Triglycerides: 124 mg/dL (ref 0.0–149.0)
VLDL: 24.8 mg/dL (ref 0.0–40.0)

## 2019-11-06 ENCOUNTER — Encounter: Payer: Self-pay | Admitting: Internal Medicine

## 2019-11-06 ENCOUNTER — Other Ambulatory Visit: Payer: Self-pay

## 2019-11-06 ENCOUNTER — Ambulatory Visit (INDEPENDENT_AMBULATORY_CARE_PROVIDER_SITE_OTHER): Payer: Medicare PPO | Admitting: Internal Medicine

## 2019-11-06 DIAGNOSIS — E7800 Pure hypercholesterolemia, unspecified: Secondary | ICD-10-CM | POA: Diagnosis not present

## 2019-11-06 DIAGNOSIS — C3411 Malignant neoplasm of upper lobe, right bronchus or lung: Secondary | ICD-10-CM | POA: Diagnosis not present

## 2019-11-06 DIAGNOSIS — R739 Hyperglycemia, unspecified: Secondary | ICD-10-CM

## 2019-11-06 DIAGNOSIS — I714 Abdominal aortic aneurysm, without rupture, unspecified: Secondary | ICD-10-CM

## 2019-11-06 DIAGNOSIS — K219 Gastro-esophageal reflux disease without esophagitis: Secondary | ICD-10-CM | POA: Diagnosis not present

## 2019-11-06 DIAGNOSIS — I10 Essential (primary) hypertension: Secondary | ICD-10-CM

## 2019-11-06 NOTE — Progress Notes (Signed)
Patient ID: Heather Cooper, female   DOB: 01-Oct-1930, 86 y.o.   MRN: [id number]   Subjective:    Patient ID: Heather Cooper, female    DOB: [birth year], 86 y.o.   MRN: [id number]  HPI This visit occurred during the SARS-CoV-2 public health emergency.  Safety protocols were in place, including screening questions prior to the visit, additional usage of staff PPE, and extensive cleaning of exam room while observing appropriate contact time as indicated for disinfecting solutions.  Patient here for a scheduled follow up. Here to follow up regarding her elevated blood pressure and cholesterol.  Sees Dr Rogue Bussing for f/u history of lung cancer.  S/p chemoradiation.  Last evaluated 08/07/19 - recommended imaging annually and f/u in 6 months. Sees Dr Delana Meyer regarding f/u abdominal aoric aneurysm.  Last seen 01/2018.  Recommended f/u in 24 months.  She reports her breathing is stable.  No increased cough or congestion.  No acid reflux reported.  No abdominal pain or bowel change reported.  Overall feels good.  Son getting married next weekend.    Past Medical History:  Diagnosis Date  . Chicken pox   . Hypercalcemia    familial hypocalciuric hypercalcemia  . Hypercholesterolemia   . Hypertension   . Lung cancer ([place])   . Osteoporosis   . Thyroid disease    Past Surgical History:  Procedure Laterality Date  . ABDOMINAL HYSTERECTOMY     partial  . CHOLECYSTECTOMY    . COLONOSCOPY WITH PROPOFOL N/A 04/17/2017   Procedure: COLONOSCOPY WITH PROPOFOL;  Surgeon: Manya Silvas, MD;  Location: [facility] ENDOSCOPY;  Service: Endoscopy;  Laterality: N/A;  . transvaginal hysterectomy  04/18/05   with anterior colporrhaphy   Family History  Problem Relation Age of Onset  . Stroke Mother   . Hypertension Mother   . Prostate cancer Father   . Cancer Father        prostate  . Heart disease Brother        s/p CABG  . Colon cancer Neg Hx    Social History   Socioeconomic History  . Marital status:  Widowed    Spouse name: Not on file  . Number of children: 3  . Years of education: Not on file  . Highest education level: Not on file  Occupational History  . Not on file  Tobacco Use  . Smoking status: Former Smoker    Quit date: 06/13/1997    Years since quitting: 22.4  . Smokeless tobacco: Never Used  Substance and Sexual Activity  . Alcohol use: No    Alcohol/week: 0.0 standard drinks  . Drug use: No  . Sexual activity: Not on file  Other Topics Concern  . Not on file  Social History Narrative   Lost her husband November 2019   Social Determinants of Health   Financial Resource Strain:   . Difficulty of Paying Living Expenses:   Food Insecurity:   . Worried About Charity fundraiser in the Last Year:   . Arboriculturist in the Last Year:   Transportation Needs:   . Film/video editor (Medical):   [name] Lack of Transportation (Non-Medical):   Physical Activity:   . Days of Exercise per Week:   . Minutes of Exercise per Session:   Stress: No Stress Concern Present  . Feeling of Stress : Not at all  Social Connections:   . Frequency of Communication with Friends and Family:   . Frequency of Social  Gatherings with Friends and Family:   . Attends Religious Services:   . Active Member of Clubs or Organizations:   . Attends Archivist Meetings:   [name] Marital Status:     Outpatient Encounter Medications as of 11/06/2019  Medication Sig  . Ascorbic Acid (VITAMIN C) 1000 MG tablet Take 1,000 mg by mouth daily.  [name] olmesartan-hydrochlorothiazide (BENICAR HCT) 20-12.5 MG tablet TAKE 1 TABLET BY MOUTH ONCE DAILY  . pantoprazole (PROTONIX) 40 MG tablet TAKE 1 TABLET BY MOUTH ONCE DAILY   Facility-Administered Encounter Medications as of 11/06/2019  Medication  . sodium chloride 0.9 % 1,000 mL with potassium chloride 20 mEq, magnesium sulfate 2 g infusion    Review of Systems  Constitutional: Negative for appetite change and unexpected weight change.  HENT:  Negative for congestion and sinus pressure.   Respiratory: Negative for cough, chest tightness and shortness of breath.   Cardiovascular: Negative for chest pain, palpitations and leg swelling.  Gastrointestinal: Negative for abdominal pain, diarrhea, nausea and vomiting.  Genitourinary: Negative for difficulty urinating and dysuria.  Musculoskeletal: Negative for joint swelling and myalgias.  Skin: Negative for color change and rash.  Neurological: Negative for dizziness, light-headedness and headaches.  Psychiatric/Behavioral: Negative for agitation and dysphoric mood.       Objective:    Physical Exam Vitals reviewed.  Constitutional:      General: She is not in acute distress.    Appearance: Normal appearance.  HENT:     Head: Normocephalic and atraumatic.     Right Ear: External ear normal.     Left Ear: External ear normal.  Eyes:     General: No scleral icterus.       Right eye: No discharge.        Left eye: No discharge.     Conjunctiva/sclera: Conjunctivae normal.  Neck:     Thyroid: No thyromegaly.  Cardiovascular:     Rate and Rhythm: Normal rate and regular rhythm.  Pulmonary:     Effort: No respiratory distress.     Breath sounds: Normal breath sounds. No wheezing.  Abdominal:     General: Bowel sounds are normal.     Palpations: Abdomen is soft.     Tenderness: There is no abdominal tenderness.  Musculoskeletal:        General: No swelling or tenderness.     Cervical back: Neck supple. No tenderness.  Lymphadenopathy:     Cervical: No cervical adenopathy.  Skin:    Findings: No erythema or rash.  Neurological:     Mental Status: She is alert.  Psychiatric:        Mood and Affect: Mood normal.        Behavior: Behavior normal.     BP 136/76   Pulse 72   Temp (!) 96 F (35.6 C) (Temporal)   Ht _0  (1.6 m)   Wt 156 lb 9.6 oz (71 kg)   SpO2 99%   BMI 27.74 kg/m  Wt Readings from Last 3 Encounters:  11/06/19 156 lb 9.6 oz (71 kg)  08/07/19  160 lb 8 oz (72.8 kg)  05/06/19 161 lb 6.4 oz (73.2 kg)     Lab Results  Component Value Date   WBC 9.1 08/07/2019   HGB 12.5 08/07/2019   HCT 38.1 08/07/2019   PLT 270 08/07/2019   GLUCOSE 118 (H) 11/04/2019   CHOL 168 11/04/2019   TRIG 124.0 11/04/2019   HDL 41.10 11/04/2019   LDLCALC 102 (H)  11/04/2019   ALT 13 11/04/2019   AST 15 11/04/2019   NA 139 11/04/2019   K 4.2 11/04/2019   CL 102 11/04/2019   CREATININE 1.08 11/04/2019   BUN 26 (H) 11/04/2019   CO2 29 11/04/2019   TSH 0.72 05/02/2019   INR 1.0 03/18/2014   INR 1.0 03/18/2014   HGBA1C 6.6 (H) 05/02/2019    CT CHEST WO CONTRAST  Result Date: 01/28/2019 CLINICAL DATA:  Restaging.  Lung cancer. EXAM: CT CHEST WITHOUT CONTRAST TECHNIQUE: Multidetector CT imaging of the chest was performed following the standard protocol without IV contrast. COMPARISON:  07/03/2018 FINDINGS: Cardiovascular: Normal heart size. Aortic atherosclerosis. The left main, lad, left circumflex and RCA coronary artery calcifications. Mediastinum/Nodes: Normal appearance of the thyroid gland. The trachea appears patent and is midline. Normal appearance of the esophagus. No mediastinal or hilar adenopathy. Lungs/Pleura: No pleural effusion. Centrilobular emphysema. Paramediastinal radiation change identified bilaterally. The appearance is stable from the previous exam. No suspicious pulmonary nodule or mass identified. Upper Abdomen: No acute abnormality. Musculoskeletal: No chest wall mass or suspicious bone lesions identified. IMPRESSION: 1. Stable appearance of the chest. No evidence of new or progressive malignancy. 2. Paramediastinal radiation change within both lungs is similar to previous exam. 3. Aortic Atherosclerosis (ICD10-I70.0) and Emphysema (ICD10-J43.9). 4. Coronary artery calcifications. Electronically Signed   By: Kerby Moors M.D.   On: 01/28/2019 10:31       Assessment & Plan:   Problem List Items Addressed This Visit     Abdominal aortic aneurysm ([place])    Evaluated by Dr Delana Meyer.  Last evaluated 01/2018.  Stable. Recommended f/u in 24 months.        GERD (gastroesophageal reflux disease)    Controlled on protonix.  Follow.        Hypercholesterolemia    Low cholesterol diet and exercise.  Follow lipid panel.       Hyperglycemia    Low carb diet and exercise.  Follow met b and a1c.        Hypertension    Blood pressure well controlled.  Continue benicar/hctz.  Follow pressures.  Follow metabolic panel.       Malignant neoplasm of right upper lobe of lung (HCC)    Right upper lobe lung cancer - adeno ca - stage IV.  S/p chemoradiation.  Seeing Dr Rogue Bussing.  Last evaluated 08/07/19. Recommended annual imaging and f/u in 6 months.            [name], MD

## 2019-11-16 ENCOUNTER — Encounter: Payer: Self-pay | Admitting: Internal Medicine

## 2019-11-16 NOTE — Assessment & Plan Note (Signed)
Controlled on protonix.  Follow.   

## 2019-11-16 NOTE — Assessment & Plan Note (Signed)
Low carb diet and exercise.  Follow met b and a1c.   

## 2019-11-16 NOTE — Assessment & Plan Note (Signed)
Right upper lobe lung cancer - adeno ca - stage IV.  S/p chemoradiation.  Seeing Dr Rogue Bussing.  Last evaluated 08/07/19. Recommended annual imaging and f/u in 6 months.

## 2019-11-16 NOTE — Assessment & Plan Note (Signed)
Evaluated by Dr Delana Meyer.  Last evaluated 01/2018.  Stable. Recommended f/u in 24 months.

## 2019-11-16 NOTE — Assessment & Plan Note (Signed)
Blood pressure well controlled.  Continue benicar/hctz.  Follow pressures.  Follow metabolic panel.

## 2019-11-16 NOTE — Assessment & Plan Note (Signed)
Low cholesterol diet and exercise.  Follow lipid panel.   

## 2020-01-08 ENCOUNTER — Ambulatory Visit (INDEPENDENT_AMBULATORY_CARE_PROVIDER_SITE_OTHER): Payer: Medicare PPO

## 2020-01-08 VITALS — Ht 63.0 in | Wt 156.0 lb

## 2020-01-08 DIAGNOSIS — Z0000 Encounter for general adult medical examination without abnormal findings: Secondary | ICD-10-CM | POA: Diagnosis not present

## 2020-01-08 NOTE — Progress Notes (Addendum)
Subjective:   Heather Cooper is a 86 y.o. female who presents for Medicare Annual (Subsequent) preventive examination.  Review of Systems    No ROS.  Medicare Wellness Virtual Visit.    Cardiac Risk Factors include: advanced age (>59men, >52 women);hypertension     Objective:    Today's Vitals   01/08/20 1033  Weight: 156 lb (70.8 kg)  Height: 5\' 3"  (1.6 m)   Body mass index is 27.63 kg/m.  Advanced Directives 01/08/2020 02/04/2019 01/07/2019 07/05/2018 01/03/2018 01/01/2018 04/17/2017  Does Patient Have a Medical Advance Directive? Yes No Yes Yes Yes Yes Yes  Type of Paramedic of [place];Living will - [place];Living will [place];Living will Living will [place];Living will Living will  Does patient want to make changes to medical advance directive? No - Patient declined - No - Patient declined - No - Patient declined No - Patient declined -  Copy of [place] in Chart? No - copy requested - No - copy requested No - copy requested No - copy requested No - copy requested -  Would patient like information on creating a medical advance directive? - No - Patient declined - No - Patient declined - - No - Patient declined    Current Medications (verified) Outpatient Encounter Medications as of 01/08/2020  Medication Sig  . Ascorbic Acid (VITAMIN C) 1000 MG tablet Take 1,000 mg by mouth daily.  [name] olmesartan-hydrochlorothiazide (BENICAR HCT) 20-12.5 MG tablet TAKE 1 TABLET BY MOUTH ONCE DAILY  . pantoprazole (PROTONIX) 40 MG tablet TAKE 1 TABLET BY MOUTH ONCE DAILY   Facility-Administered Encounter Medications as of 01/08/2020  Medication  . sodium chloride 0.9 % 1,000 mL with potassium chloride 20 mEq, magnesium sulfate 2 g infusion    Allergies (verified) Paclitaxel   History: Past Medical History:  Diagnosis Date  . Chicken pox   . Hypercalcemia    familial hypocalciuric  hypercalcemia  . Hypercholesterolemia   . Hypertension   . Lung cancer ([place])   . Osteoporosis   . Thyroid disease    Past Surgical History:  Procedure Laterality Date  . ABDOMINAL HYSTERECTOMY     partial  . CHOLECYSTECTOMY    . COLONOSCOPY WITH PROPOFOL N/A 04/17/2017   Procedure: COLONOSCOPY WITH PROPOFOL;  Surgeon: Manya Silvas, MD;  Location: [facility] ENDOSCOPY;  Service: Endoscopy;  Laterality: N/A;  . transvaginal hysterectomy  04/18/05   with anterior colporrhaphy   Family History  Problem Relation Age of Onset  . Stroke Mother   . Hypertension Mother   . Prostate cancer Father   . Cancer Father        prostate  . Heart disease Brother        s/p CABG  . Colon cancer Neg Hx    Social History   Socioeconomic History  . Marital status: Widowed    Spouse name: Not on file  . Number of children: 3  . Years of education: Not on file  . Highest education level: Not on file  Occupational History  . Not on file  Tobacco Use  . Smoking status: Former Smoker    Quit date: 06/13/1997    Years since quitting: 22.5  . Smokeless tobacco: Never Used  Vaping Use  . Vaping Use: Never used  Substance and Sexual Activity  . Alcohol use: No    Alcohol/week: 0.0 standard drinks  . Drug use: No  . Sexual activity: Not  on file  Other Topics Concern  . Not on file  Social History Narrative   Lost her husband November 2019   Social Determinants of Health   Financial Resource Strain: Low Risk   . Difficulty of Paying Living Expenses: Not very hard  Food Insecurity: No Food Insecurity  . Worried About Charity fundraiser in the Last Year: Never true  . Ran Out of Food in the Last Year: Never true  Transportation Needs: No Transportation Needs  . Lack of Transportation (Medical): No  . Lack of Transportation (Non-Medical): No  Physical Activity:   . Days of Exercise per Week:   . Minutes of Exercise per Session:   Stress: No Stress Concern Present  . Feeling of Stress :  Not at all  Social Connections: Moderately Integrated  . Frequency of Communication with Friends and Family: More than three times a week  . Frequency of Social Gatherings with Friends and Family: More than three times a week  . Attends Religious Services: More than 4 times per year  . Active Member of Clubs or Organizations: Yes  . Attends Archivist Meetings: More than 4 times per year  . Marital Status: Widowed    Tobacco Counseling Counseling given: Not Answered   Clinical Intake:  Pre-visit preparation completed: Yes        Diabetes: No  How often do you need to have someone help you when you read instructions, pamphlets, or other written materials from your doctor or pharmacy?: 1 - Never   Interpreter Needed?: No      Activities of Daily Living In your present state of health, do you have any difficulty performing the following activities: 01/08/2020  Hearing? N  Vision? N  Difficulty concentrating or making decisions? N  Walking or climbing stairs? N  Dressing or bathing? N  Doing errands, shopping? Y  Preparing Food and eating ? N  Using the Toilet? N  In the past six months, have you accidently leaked urine? N  Do you have problems with loss of bowel control? N  Managing your Medications? N  Managing your Finances? N  Housekeeping or managing your Housekeeping? Y  Some recent data might be hidden    Patient Care Team: [name], MD as PCP - General (Internal Medicine)  Indicate any recent Medical Services you may have received from other than [facility] providers in the past year (date may be approximate).     Assessment:   This is a routine wellness examination for [place].  I connected with Maddi today by telephone and verified that I am speaking with the correct person using two identifiers. Location patient: home Location provider: work Persons participating in the virtual visit: patient, Marine scientist.    I discussed the limitations, risks,  security and privacy concerns of performing an evaluation and management service by telephone and the availability of in person appointments. The patient expressed understanding and verbally consented to this telephonic visit.    Interactive audio and video telecommunications were attempted between this provider and patient, however failed, due to patient having technical difficulties OR patient did not have access to video capability.  We continued and completed visit with audio only.  Some vital signs may be absent or patient reported.   Hearing/Vision screen  Hearing Screening   125Hz  250Hz  500Hz  1000Hz  2000Hz  3000Hz  4000Hz  6000Hz  8000Hz   Right ear:           Left ear:  Comments: Patient is able to hear conversational tones without difficulty.  No issues reported.    Vision Screening Comments: Followed by Dr. Ellin Mayhew Wears reader lenses Cataract extraction, bilateral Visual acuity not assessed, virtual visit.  They have seen their ophthalmologist in the last 12 months.   Dietary issues and exercise activities discussed: Current Exercise Habits: Home exercise routine, Type of exercise: calisthenics (Foot peddler), Time (Minutes): 20, Intensity: Mild  Goals    . Maintain Healthy Lifestyle     Brain engagement activity (read the newspaper/book, word search etc..) Stay active Stay hydrated Healthy diet       Depression Screen PHQ 2/9 Scores 01/08/2020 11/06/2019 01/04/2019 01/03/2018 10/10/2016 04/14/2016 10/11/2013  PHQ - 2 Score 0 0 0 0 0 0 0  PHQ- 9 Score - - - - 0 - -    Fall Risk Fall Risk  01/08/2020 11/06/2019 01/03/2018 10/10/2016 04/14/2016  Falls in the past year? 0 0 No No No  Number falls in past yr: 0 0 - - -  Injury with Fall? - 0 - - -  Risk for fall due to : - Impaired balance/gait;Impaired mobility - - -  Follow up Falls evaluation completed Falls evaluation completed - - -   Handrails in use when climbing stairs? Yes  Home free of loose throw rugs in  walkways, pet beds, electrical cords, etc? Yes  Adequate lighting in your home to reduce risk of falls? Yes   ASSISTIVE DEVICES UTILIZED TO PREVENT FALLS:  Life alert? Yes  Use of a cane, walker or w/c? No  Grab bars in the bathroom? Yes  Shower chair or bench in shower? Yes  Elevated toilet seat or a handicapped toilet? Yes   TIMED UP AND GO:  Was the test performed? No . Virtual visit.  Cognitive Function:     6CIT Screen 01/08/2020 01/07/2019 01/03/2018 10/10/2016  What Year? 0 points 0 points 0 points 0 points  What month? 0 points 0 points 0 points 0 points  What time? - 0 points 0 points 0 points  Count back from 20 - 0 points 0 points 0 points  Months in reverse 0 points 0 points 0 points 0 points  Repeat phrase 0 points - 0 points 0 points  Total Score - - 0 0    Immunizations Immunization History  Administered Date(s) Administered  . Influenza Split 03/24/2014  . Influenza, High Dose Seasonal PF 03/21/2016, 02/22/2017, 02/20/2018, 03/01/2019  . Influenza,inj,Quad PF,6+ Mos 03/02/2015  . Influenza-Unspecified 03/14/2012, 02/17/2018  . PFIZER SARS-COV-2 Vaccination 07/04/2019, 07/25/2019  . Pneumococcal Conjugate-13 05/06/2019  . Pneumococcal Polysaccharide-23 06/26/2012   TDAP status: Due, Education has been provided regarding the importance of this vaccine. Advised may receive this vaccine at local pharmacy or Health Dept. Aware to provide a copy of the vaccination record if obtained from local pharmacy or Health Dept. Verbalized acceptance and understanding. Deferred.   Health Maintenance Health Maintenance  Topic Date Due  . TETANUS/TDAP  Never done  . INFLUENZA VACCINE  01/12/2020  . DEXA SCAN  Completed  . COVID-19 Vaccine  Completed  . PNA vac Low Risk Adult  Completed  . MAMMOGRAM  Discontinued   Dental Screening: Recommended annual dental exams for proper oral hygiene  Community Resource Referral / Chronic Care Management: CRR required this visit?  No    CCM required this visit?  No      Plan:   Keep all routine maintenance appointments.   Follow up 05/11/20 @ 10:30  I have personally reviewed and noted the following in the patient's chart:   . Medical and social history . Use of alcohol, tobacco or illicit drugs  . Current medications and supplements . Functional ability and status . Nutritional status . Physical activity . Advanced directives . List of other physicians . Hospitalizations, surgeries, and ER visits in previous 12 months . Vitals . Screenings to include cognitive, depression, and falls . Referrals and appointments  In addition, I have reviewed and discussed with patient certain preventive protocols, quality metrics, and best practice recommendations. A written personalized care plan for preventive services as well as general preventive health recommendations were provided to patient via mychart.     Varney Biles, LPN   03/11/2445     Reviewed above information.  Agree with assessment and plan.   Dr Nicki Reaper

## 2020-01-08 NOTE — Patient Instructions (Addendum)
[name] , Thank you for taking time to come for your Medicare Wellness Visit. I appreciate your ongoing commitment to your health goals. Please review the following plan we discussed and let me know if I can assist you in the future.   These are the goals we discussed: Goals    . Maintain Healthy Lifestyle     Brain engagement activity (read the newspaper/book, word search etc..) Stay active Stay hydrated Healthy diet        This is a list of the screening recommended for you and due dates:  Health Maintenance  Topic Date Due  . Tetanus Vaccine  Never done  . Flu Shot  01/12/2020  . DEXA scan (bone density measurement)  Completed  . COVID-19 Vaccine  Completed  . Pneumonia vaccines  Completed  . Mammogram  Discontinued    Immunizations Immunization History  Administered Date(s) Administered  . Influenza Split 03/24/2014  . Influenza, High Dose Seasonal PF 03/21/2016, 02/22/2017, 02/20/2018, 03/01/2019  . Influenza,inj,Quad PF,6+ Mos 03/02/2015  . Influenza-Unspecified 03/14/2012, 02/17/2018  . PFIZER SARS-COV-2 Vaccination 07/04/2019, 07/25/2019  . Pneumococcal Conjugate-13 05/06/2019  . Pneumococcal Polysaccharide-23 06/26/2012   Keep all routine maintenance appointments.   Follow up 05/11/20 @ 10:30  Advanced directives: End of life planning; Advance aging; Advanced directives discussed.  Copy of current HCPOA/Living Will requested.    Conditions/risks identified: none new.   Follow up in one year for your annual wellness visit.   Preventive Care 51 Years and Older, Female Preventive care refers to lifestyle choices and visits with your health care provider that can promote health and wellness. What does preventive care include?  A yearly physical exam. This is also called an annual well check.  Dental exams once or twice a year.  Routine eye exams. Ask your health care provider how often you should have your eyes checked.  Personal lifestyle choices,  including:  Daily care of your teeth and gums.  Regular physical activity.  Eating a healthy diet.  Avoiding tobacco and drug use.  Limiting alcohol use.  Practicing safe sex.  Taking low-dose aspirin every day.  Taking vitamin and mineral supplements as recommended by your health care provider. What happens during an annual well check? The services and screenings done by your health care provider during your annual well check will depend on your age, overall health, lifestyle risk factors, and family history of disease. Counseling  Your health care provider may ask you questions about your:  Alcohol use.  Tobacco use.  Drug use.  Emotional well-being.  Home and relationship well-being.  Sexual activity.  Eating habits.  History of falls.  Memory and ability to understand (cognition).  Work and work Statistician.  Reproductive health. Screening  You may have the following tests or measurements:  Height, weight, and BMI.  Blood pressure.  Lipid and cholesterol levels. These may be checked every 5 years, or more frequently if you are over 23 years old.  Skin check.  Lung cancer screening. You may have this screening every year starting at age 21 if you have a 30-pack-year history of smoking and currently smoke or have quit within the past 15 years.  Fecal occult blood test (FOBT) of the stool. You may have this test every year starting at age 24.  Flexible sigmoidoscopy or colonoscopy. You may have a sigmoidoscopy every 5 years or a colonoscopy every 10 years starting at age 45.  Hepatitis C blood test.  Hepatitis B blood test.  Sexually  transmitted disease (STD) testing.  Diabetes screening. This is done by checking your blood sugar (glucose) after you have not eaten for a while (fasting). You may have this done every 1-3 years.  Bone density scan. This is done to screen for osteoporosis. You may have this done starting at age 78.  Mammogram. This  may be done every 1-2 years. Talk to your health care provider about how often you should have regular mammograms. Talk with your health care provider about your test results, treatment options, and if necessary, the need for more tests. Vaccines  Your health care provider may recommend certain vaccines, such as:  Influenza vaccine. This is recommended every year.  Tetanus, diphtheria, and acellular pertussis (Tdap, Td) vaccine. You may need a Td booster every 10 years.  Zoster vaccine. You may need this after age 55.  Pneumococcal 13-valent conjugate (PCV13) vaccine. One dose is recommended after age 96.  Pneumococcal polysaccharide (PPSV23) vaccine. One dose is recommended after age 84. Talk to your health care provider about which screenings and vaccines you need and how often you need them. This information is not intended to replace advice given to you by your health care provider. Make sure you discuss any questions you have with your health care provider. Document Released: 06/26/2015 Document Revised: 02/17/2016 Document Reviewed: 03/31/2015 Elsevier Interactive Patient Education  2017 [place] Prevention in the Home Falls can cause injuries. They can happen to people of all ages. There are many things you can do to make your home safe and to help prevent falls. What can I do on the outside of my home?  Regularly fix the edges of walkways and driveways and fix any cracks.  Remove anything that might make you trip as you walk through a door, such as a raised step or threshold.  Trim any bushes or trees on the path to your home.  Use bright outdoor lighting.  Clear any walking paths of anything that might make someone trip, such as rocks or tools.  Regularly check to see if handrails are loose or broken. Make sure that both sides of any steps have handrails.  Any raised decks and porches should have guardrails on the edges.  Have any leaves, snow, or ice cleared  regularly.  Use sand or salt on walking paths during winter.  Clean up any spills in your garage right away. This includes oil or grease spills. What can I do in the bathroom?  Use night lights.  Install grab bars by the toilet and in the tub and shower. Do not use towel bars as grab bars.  Use non-skid mats or decals in the tub or shower.  If you need to sit down in the shower, use a plastic, non-slip stool.  Keep the floor dry. Clean up any water that spills on the floor as soon as it happens.  Remove soap buildup in the tub or shower regularly.  Attach bath mats securely with double-sided non-slip rug tape.  Do not have throw rugs and other things on the floor that can make you trip. What can I do in the bedroom?  Use night lights.  Make sure that you have a light by your bed that is easy to reach.  Do not use any sheets or blankets that are too big for your bed. They should not hang down onto the floor.  Have a firm chair that has side arms. You can use this for support while you get dressed.  Do not have throw rugs and other things on the floor that can make you trip. What can I do in the kitchen?  Clean up any spills right away.  Avoid walking on wet floors.  Keep items that you use a lot in easy-to-reach places.  If you need to reach something above you, use a strong step stool that has a grab bar.  Keep electrical cords out of the way.  Do not use floor polish or wax that makes floors slippery. If you must use wax, use non-skid floor wax.  Do not have throw rugs and other things on the floor that can make you trip. What can I do with my stairs?  Do not leave any items on the stairs.  Make sure that there are handrails on both sides of the stairs and use them. Fix handrails that are broken or loose. Make sure that handrails are as long as the stairways.  Check any carpeting to make sure that it is firmly attached to the stairs. Fix any carpet that is loose  or worn.  Avoid having throw rugs at the top or bottom of the stairs. If you do have throw rugs, attach them to the floor with carpet tape.  Make sure that you have a light switch at the top of the stairs and the bottom of the stairs. If you do not have them, ask someone to add them for you. What else can I do to help prevent falls?  Wear shoes that:  Do not have high heels.  Have rubber bottoms.  Are comfortable and fit you well.  Are closed at the toe. Do not wear sandals.  If you use a stepladder:  Make sure that it is fully opened. Do not climb a closed stepladder.  Make sure that both sides of the stepladder are locked into place.  Ask someone to hold it for you, if possible.  Clearly mark and make sure that you can see:  Any grab bars or handrails.  First and last steps.  Where the edge of each step is.  Use tools that help you move around (mobility aids) if they are needed. These include:  Canes.  Walkers.  Scooters.  Crutches.  Turn on the lights when you go into a dark area. Replace any light bulbs as soon as they burn out.  Set up your furniture so you have a clear path. Avoid moving your furniture around.  If any of your floors are uneven, fix them.  If there are any pets around you, be aware of where they are.  Review your medicines with your doctor. Some medicines can make you feel dizzy. This can increase your chance of falling. Ask your doctor what other things that you can do to help prevent falls. This information is not intended to replace advice given to you by your health care provider. Make sure you discuss any questions you have with your health care provider. Document Released: 03/26/2009 Document Revised: 11/05/2015 Document Reviewed: 07/04/2014 Elsevier Interactive Patient Education  2017 [employer].

## 2020-01-15 NOTE — Progress Notes (Signed)
MRN : [id number]  Heather Cooper is a 86 y.o. (06-19-1930) female who presents with chief complaint of No chief complaint on file. [name]  History of Present Illness:   The patient returns to the office for surveillance of a known abdominal aortic aneurysm. Patient denies abdominal pain or back pain, no other abdominal complaints. No changes suggesting embolic episodes.   There have been no interval changes in the patient's overall health care since his last visit.  Patient denies amaurosis fugax or TIA symptoms. There is no history of claudication or rest pain symptoms of the lower extremities. The patient denies angina or shortness of breath.   Duplex US of the aorta and iliac arteries shows an AAA measured 4.68 cm.  This is a change compared to last study which measured her AAA at 3.32 cm  No outpatient medications have been marked as taking for the 01/16/20 encounter (Appointment) with Delana Meyer, Dolores Lory, MD.    Past Medical History:  Diagnosis Date  . Chicken pox   . Hypercalcemia    familial hypocalciuric hypercalcemia  . Hypercholesterolemia   . Hypertension   . Lung cancer ([place])   . Osteoporosis   . Thyroid disease     Past Surgical History:  Procedure Laterality Date  . ABDOMINAL HYSTERECTOMY     partial  . CHOLECYSTECTOMY    . COLONOSCOPY WITH PROPOFOL N/A 04/17/2017   Procedure: COLONOSCOPY WITH PROPOFOL;  Surgeon: Manya Silvas, MD;  Location: [facility] ENDOSCOPY;  Service: Endoscopy;  Laterality: N/A;  . transvaginal hysterectomy  04/18/05   with anterior colporrhaphy    Social History Social History   Tobacco Use  . Smoking status: Former Smoker    Quit date: 06/13/1997    Years since quitting: 22.6  . Smokeless tobacco: Never Used  Vaping Use  . Vaping Use: Never used  Substance Use Topics  . Alcohol use: No    Alcohol/week: 0.0 standard drinks  . Drug use: No    Family History Family History  Problem Relation Age of Onset  . Stroke Mother   .  Hypertension Mother   . Prostate cancer Father   . Cancer Father        prostate  . Heart disease Brother        s/p CABG  . Colon cancer Neg Hx     Allergies  Allergen Reactions  . Paclitaxel Other (See Comments)    Chest tightness     REVIEW OF SYSTEMS (Negative unless checked)  Constitutional: [] Weight loss  [] Fever  [] Chills Cardiac: [] Chest pain   [] Chest pressure   [] Palpitations   [] Shortness of breath when laying flat   [] Shortness of breath with exertion. Vascular:  [] Pain in legs with walking   [] Pain in legs at rest  [] History of DVT   [] Phlebitis   [] Swelling in legs   [] Varicose veins   [] Non-healing ulcers Pulmonary:   [] Uses home oxygen   [] Productive cough   [] Hemoptysis   [] Wheeze  [] COPD   [] Asthma Neurologic:  [] Dizziness   [] Seizures   [] History of stroke   [] History of TIA  [] Aphasia   [] Vissual changes   [] Weakness or numbness in arm   [] Weakness or numbness in leg Musculoskeletal:   [] Joint swelling   [] Joint pain   [] Low back pain Hematologic:  [] Easy bruising  [] Easy bleeding   [] Hypercoagulable state   [] Anemic Gastrointestinal:  [] Diarrhea   [] Vomiting  [] Gastroesophageal reflux/heartburn   [] Difficulty swallowing. Genitourinary:  [] Chronic kidney disease   []   Difficult urination  [] Frequent urination   [] Blood in urine Skin:  [] Rashes   [] Ulcers  Psychological:  [] History of anxiety   []  History of major depression.  Physical Examination  There were no vitals filed for this visit. There is no height or weight on file to calculate BMI. Gen: WD/WN, NAD Head: [place]/AT, No temporalis wasting.  Ear/Nose/Throat: Hearing grossly intact, nares w/o erythema or drainage Eyes: PER, EOMI, sclera nonicteric.  Neck: Supple, no large masses.   Pulmonary:  Good air movement, no audible wheezing bilaterally, no use of accessory muscles.  Cardiac: RRR, no JVD Vascular:  Vessel Right Left  Radial Palpable Palpable  Gastrointestinal: Non-distended. No guarding/no  peritoneal signs.  Musculoskeletal: M/S 5/5 throughout.  No deformity or atrophy.  Neurologic: CN 2-12 intact. Symmetrical.  Speech is fluent. Motor exam as listed above. Psychiatric: Judgment intact, Mood & affect appropriate for pt's clinical situation. Dermatologic: No rashes or ulcers noted.  No changes consistent with cellulitis.   CBC Lab Results  Component Value Date   WBC 9.1 08/07/2019   HGB 12.5 08/07/2019   HCT 38.1 08/07/2019   MCV 88.8 08/07/2019   PLT 270 08/07/2019    BMET    Component Value Date/Time   NA 139 11/04/2019 0912   NA 130 (L) 10/09/2014 0846   K 4.2 11/04/2019 0912   K 4.1 10/09/2014 0846   CL 102 11/04/2019 0912   CL 98 (L) 10/09/2014 0846   CO2 29 11/04/2019 0912   CO2 25 10/09/2014 0846   GLUCOSE 118 (H) 11/04/2019 0912   GLUCOSE 161 (H) 10/09/2014 0846   BUN 26 (H) 11/04/2019 0912   BUN 14 10/09/2014 0846   CREATININE 1.08 11/04/2019 0912   CREATININE 0.70 10/09/2014 0846   CALCIUM 10.0 11/04/2019 0912   CALCIUM 9.1 10/09/2014 0846   GFRNONAA 52 (L) 08/07/2019 1024   GFRNONAA >60 10/09/2014 0846   GFRAA >60 08/07/2019 1024   GFRAA >60 10/09/2014 0846   CrCl cannot be calculated (Patient's most recent lab result is older than the maximum 21 days allowed.).  COAG Lab Results  Component Value Date   INR 1.0 03/18/2014   INR 1.0 03/18/2014   PROTIME 13.2 03/18/2014    Radiology No results found.  Assessment/Plan 1. Abdominal aortic aneurysm (AAA) without rupture ([place]) No surgery or intervention at this time. The patient has an asymptomatic abdominal aortic aneurysm that is greater than 4 cm but less than 5 cm in maximal diameter.  I have discussed the natural history of abdominal aortic aneurysm and the small risk of rupture for aneurysm less than 5 cm in size.  However, as these small aneurysms tend to enlarge over time, continued surveillance with ultrasound or CT scan is mandatory.  I have also discussed optimizing medical  management with hypertension and lipid control and the importance of abstinence from tobacco.  The patient is also encouraged to exercise a minimum of 30 minutes 4 times a week.  Should the patient develop new onset abdominal or back pain or signs of peripheral embolization they are instructed to seek medical attention immediately and to alert the physician providing care that they have an aneurysm.  The patient voices their understanding. I have scheduled the patient to return in 6 months with an aortic duplex.  2. Essential hypertension Continue antihypertensive medications as already ordered, these medications have been reviewed and there are no changes at this time.   3. Hypercholesterolemia Continue statin as ordered and reviewed, no changes at  this time   4. Gastroesophageal reflux disease, unspecified whether esophagitis present Continue PPI as already ordered, this medication has been reviewed and there are no changes at this time.  Avoidence of caffeine and alcohol  Moderate elevation of the head of the bed     Hortencia Pilar, MD  01/15/2020 11:08 AM

## 2020-01-16 ENCOUNTER — Ambulatory Visit (INDEPENDENT_AMBULATORY_CARE_PROVIDER_SITE_OTHER): Payer: Medicare PPO | Admitting: Vascular Surgery

## 2020-01-16 ENCOUNTER — Ambulatory Visit (INDEPENDENT_AMBULATORY_CARE_PROVIDER_SITE_OTHER): Payer: Medicare PPO

## 2020-01-16 ENCOUNTER — Other Ambulatory Visit: Payer: Self-pay

## 2020-01-16 ENCOUNTER — Encounter (INDEPENDENT_AMBULATORY_CARE_PROVIDER_SITE_OTHER): Payer: Self-pay | Admitting: Vascular Surgery

## 2020-01-16 VITALS — BP 124/72 | HR 84 | Resp 16 | Wt 156.8 lb

## 2020-01-16 DIAGNOSIS — I714 Abdominal aortic aneurysm, without rupture, unspecified: Secondary | ICD-10-CM

## 2020-01-16 DIAGNOSIS — E7800 Pure hypercholesterolemia, unspecified: Secondary | ICD-10-CM | POA: Diagnosis not present

## 2020-01-16 DIAGNOSIS — I10 Essential (primary) hypertension: Secondary | ICD-10-CM

## 2020-01-16 DIAGNOSIS — K219 Gastro-esophageal reflux disease without esophagitis: Secondary | ICD-10-CM | POA: Diagnosis not present

## 2020-01-19 ENCOUNTER — Encounter (INDEPENDENT_AMBULATORY_CARE_PROVIDER_SITE_OTHER): Payer: Self-pay | Admitting: Vascular Surgery

## 2020-02-04 ENCOUNTER — Other Ambulatory Visit: Payer: Self-pay

## 2020-02-04 ENCOUNTER — Ambulatory Visit
Admission: RE | Admit: 2020-02-04 | Discharge: 2020-02-04 | Disposition: A | Discharge status: Home / Self Care | Payer: Medicare PPO | Source: Ambulatory Visit | Attending: Internal Medicine | Admitting: Internal Medicine

## 2020-02-04 DIAGNOSIS — C3411 Malignant neoplasm of upper lobe, right bronchus or lung: Secondary | ICD-10-CM | POA: Insufficient documentation

## 2020-02-05 ENCOUNTER — Encounter: Payer: Self-pay | Admitting: Internal Medicine

## 2020-02-05 ENCOUNTER — Inpatient Hospital Stay: Payer: Medicare PPO | Attending: Internal Medicine

## 2020-02-05 ENCOUNTER — Inpatient Hospital Stay (HOSPITAL_BASED_OUTPATIENT_CLINIC_OR_DEPARTMENT_OTHER): Payer: Medicare PPO | Admitting: Internal Medicine

## 2020-02-05 VITALS — BP 136/59 | HR 72 | Temp 97.9°F | Resp 16 | Ht 63.0 in | Wt 157.0 lb

## 2020-02-05 DIAGNOSIS — Z9221 Personal history of antineoplastic chemotherapy: Secondary | ICD-10-CM | POA: Insufficient documentation

## 2020-02-05 DIAGNOSIS — Z87891 Personal history of nicotine dependence: Secondary | ICD-10-CM | POA: Diagnosis not present

## 2020-02-05 DIAGNOSIS — Z923 Personal history of irradiation: Secondary | ICD-10-CM | POA: Diagnosis not present

## 2020-02-05 DIAGNOSIS — Z85118 Personal history of other malignant neoplasm of bronchus and lung: Secondary | ICD-10-CM | POA: Diagnosis not present

## 2020-02-05 DIAGNOSIS — C3411 Malignant neoplasm of upper lobe, right bronchus or lung: Secondary | ICD-10-CM

## 2020-02-05 DIAGNOSIS — D649 Anemia, unspecified: Secondary | ICD-10-CM | POA: Insufficient documentation

## 2020-02-05 DIAGNOSIS — E079 Disorder of thyroid, unspecified: Secondary | ICD-10-CM | POA: Diagnosis not present

## 2020-02-05 DIAGNOSIS — I10 Essential (primary) hypertension: Secondary | ICD-10-CM | POA: Diagnosis not present

## 2020-02-05 LAB — CBC WITH DIFFERENTIAL/PLATELET
Abs Immature Granulocytes: 0.04 10*3/uL (ref 0.00–0.07)
Basophils Absolute: 0 10*3/uL (ref 0.0–0.1)
Basophils Relative: 1 %
Eosinophils Absolute: 0.2 10*3/uL (ref 0.0–0.5)
Eosinophils Relative: 3 %
HCT: 36.8 % (ref 36.0–46.0)
Hemoglobin: 12.5 g/dL (ref 12.0–15.0)
Immature Granulocytes: 1 %
Lymphocytes Relative: 27 %
Lymphs Abs: 2.2 10*3/uL (ref 0.7–4.0)
MCH: 29.3 pg (ref 26.0–34.0)
MCHC: 34 g/dL (ref 30.0–36.0)
MCV: 86.2 fL (ref 80.0–100.0)
Monocytes Absolute: 0.6 10*3/uL (ref 0.1–1.0)
Monocytes Relative: 8 %
Neutro Abs: 4.8 10*3/uL (ref 1.7–7.7)
Neutrophils Relative %: 60 %
Platelets: 261 10*3/uL (ref 150–400)
RBC: 4.27 MIL/uL (ref 3.87–5.11)
RDW: 13 % (ref 11.5–15.5)
WBC: 7.9 10*3/uL (ref 4.0–10.5)
nRBC: 0 % (ref 0.0–0.2)

## 2020-02-05 LAB — COMPREHENSIVE METABOLIC PANEL
ALT: 15 U/L (ref 0–44)
AST: 18 U/L (ref 15–41)
Albumin: 4.3 g/dL (ref 3.5–5.0)
Alkaline Phosphatase: 68 U/L (ref 38–126)
Anion gap: 10 (ref 5–15)
BUN: 27 mg/dL — ABNORMAL HIGH (ref 8–23)
CO2: 26 mmol/L (ref 22–32)
Calcium: 9.6 mg/dL (ref 8.9–10.3)
Chloride: 103 mmol/L (ref 98–111)
Creatinine, Ser: 0.97 mg/dL (ref 0.44–1.00)
GFR calc Af Amer: >60 mL/min (ref >60)
GFR calc non Af Amer: 53 mL/min — ABNORMAL LOW (ref >60)
Glucose, Bld: 126 mg/dL — ABNORMAL HIGH (ref 70–99)
Potassium: 4.3 mmol/L (ref 3.5–5.1)
Sodium: 139 mmol/L (ref 135–145)
Total Bilirubin: 0.5 mg/dL (ref 0.3–1.2)
Total Protein: 8.2 g/dL — ABNORMAL HIGH (ref 6.5–8.1)

## 2020-02-05 NOTE — Progress Notes (Signed)
[name] OFFICE PROGRESS NOTE  Patient Care Team: [name], MD as PCP - General (Internal Medicine)  Cancer Staging No matching staging information was found for the patient.   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  Starting radiation and chemotherapy from April 14, 2014 Patient was started on carboplatinum andTaxol Herve were developed an allergic reaction to Taxol so would be changed to Abraxane 3.patient has finished 6 cycles of weekly chemotherapy with carboplatinum  and radiation therapy(May 28, 2014) 4.started on  NIVOLULAMAB because of persistent disease July 02, 2014. 5.  NIVOLULAMAB was discontinued because of persistent diarrhea in July of 2016.  August of 2016 CT scan was stable so no further chemotherapy  # AUG 25th PET- STABLE RUL MASS [radiation fibrosis; <1cm ? Mediastinal recurrence];   # AAA/ 3.3x 3.9 Stable [Dr.Schnier] -----------------------------------------------------   DIAGNOSIS: Lung cancer  STAGE:  IV-NED       ;GOALS: control  CURRENT/MOST RECENT THERAPY : surveillance    History of lung cancer  Malignant neoplasm of right upper lobe of lung ([place])  08/07/2019 Initial Diagnosis   Malignant neoplasm of right upper lobe of lung ([place])      INTERVAL HISTORY:  Heather Cooper 86 y.o.  female pleasant patient above history of Right upper lobe adenoca cell Stage IV lung cancer [contralateral lower lobe lung nodule]- currently on surveillance is here for follow-up today with a lesion on CT scan.  Patient complains of intermittent cough especially at  night. Complains of postnasal drip. Not getting any worse.  No chest pain no headaches. No blood in stools black or stools.   Review of Systems  Constitutional: Negative for chills, diaphoresis, fever, malaise/fatigue and weight loss.  HENT: Negative for nosebleeds and sore throat.   Eyes: Negative for double vision.  Respiratory: Positive for cough. Negative for hemoptysis, sputum production, shortness of breath and wheezing.   Cardiovascular: Negative for chest pain, palpitations, orthopnea and leg swelling.  Gastrointestinal: Negative for abdominal pain, blood in stool, constipation, diarrhea, heartburn, melena, nausea and vomiting.  Genitourinary: Negative for dysuria, frequency and urgency.  Musculoskeletal: Negative for back pain and joint pain.  Skin: Negative.  Negative for itching and rash.  Neurological: Negative for dizziness, tingling, focal weakness, weakness and headaches.  Endo/Heme/Allergies: Does not bruise/bleed easily.  Psychiatric/Behavioral: Negative for depression. The patient is not nervous/anxious and does not have insomnia.      PAST MEDICAL HISTORY :  Past Medical History:  Diagnosis Date  . Chicken pox   . Hypercalcemia    familial hypocalciuric hypercalcemia  . Hypercholesterolemia   . Hypertension   . Lung cancer ([place])   . Osteoporosis   . Thyroid disease     PAST SURGICAL HISTORY :   Past Surgical History:  Procedure Laterality Date  . ABDOMINAL HYSTERECTOMY     partial  . CHOLECYSTECTOMY    . COLONOSCOPY WITH PROPOFOL N/A 04/17/2017   Procedure: COLONOSCOPY WITH PROPOFOL;  Surgeon: Manya Silvas, MD;  Location: [facility] ENDOSCOPY;  Service: Endoscopy;  Laterality: N/A;  . transvaginal hysterectomy  04/18/05   with anterior colporrhaphy  FAMILY HISTORY :   Family History  Problem Relation Age of Onset  . Stroke Mother   . Hypertension Mother   . Prostate cancer Father   . Cancer Father        prostate  . Heart disease Brother         s/p CABG  . Colon cancer Neg Hx     SOCIAL HISTORY:   Social History   Tobacco Use  . Smoking status: Former Smoker    Quit date: 06/13/1997    Years since quitting: 22.6  . Smokeless tobacco: Never Used  Vaping Use  . Vaping Use: Never used  Substance Use Topics  . Alcohol use: No    Alcohol/week: 0.0 standard drinks  . Drug use: No    ALLERGIES:  is allergic to paclitaxel.  MEDICATIONS:  Current Outpatient Medications  Medication Sig Dispense Refill  . Ascorbic Acid (VITAMIN C) 1000 MG tablet Take 1,000 mg by mouth daily.    . cholecalciferol (VITAMIN D3) 25 MCG (1000 UNIT) tablet Take 1,000 Units by mouth daily.    . ferrous sulfate 325 (65 FE) MG tablet Take 325 mg by mouth daily with breakfast.    . olmesartan-hydrochlorothiazide (BENICAR HCT) 20-12.5 MG tablet TAKE 1 TABLET BY MOUTH ONCE DAILY 90 tablet 1  . pantoprazole (PROTONIX) 40 MG tablet TAKE 1 TABLET BY MOUTH ONCE DAILY 90 tablet 3   No current facility-administered medications for this visit.   Facility-Administered Medications Ordered in Other Visits  Medication Dose Route Frequency Provider Last Rate Last Admin  . sodium chloride 0.9 % 1,000 mL with potassium chloride 20 mEq, magnesium sulfate 2 g infusion   Intravenous Continuous Choksi, Delorise Shiner, MD   Stopped at 12/04/14 1300    PHYSICAL EXAMINATION: ECOG PERFORMANCE STATUS: 0 - Asymptomatic  BP (!) 136/59 (BP Location: Left Arm, Patient Position: Sitting, Cuff Size: Normal)   Pulse 72   Temp 97.9 F (36.6 C) (Tympanic)   Resp 16   Ht _0  (1.6 m)   Wt 157 lb (71.2 kg)   SpO2 100%   BMI 27.81 kg/m   Filed Weights   02/05/20 1032  Weight: 157 lb (71.2 kg)    Physical Exam Constitutional:      Comments: Accompanied her daughter.  Walking herself  HENT:     Head: Normocephalic and atraumatic.     Mouth/Throat:     Pharynx: No oropharyngeal exudate.  Eyes:     Pupils: Pupils are equal, round, and reactive to light.  Cardiovascular:      Rate and Rhythm: Normal rate and regular rhythm.  Pulmonary:     Effort: Pulmonary effort is normal. No respiratory distress.     Breath sounds: Normal breath sounds. No wheezing.  Abdominal:     General: Bowel sounds are normal. There is no distension.     Palpations: Abdomen is soft. There is no mass.     Tenderness: There is no abdominal tenderness. There is no guarding or rebound.  Musculoskeletal:        General: No tenderness. Normal range of motion.     Cervical back: Normal range of motion and neck supple.  Skin:    General: Skin is warm.  Neurological:     Mental Status: She is alert and oriented to person, place, and time.  Psychiatric:        Mood and Affect: Affect normal.      LABORATORY DATA:  I have reviewed the data as listed  Component Value Date/Time   NA 139 02/05/2020 1004   NA 130 (L) 10/09/2014 0846   K 4.3 02/05/2020 1004   K 4.1 10/09/2014 0846   CL 103 02/05/2020 1004   CL 98 (L) 10/09/2014 0846   CO2 26 02/05/2020 1004   CO2 25 10/09/2014 0846   GLUCOSE 126 (H) 02/05/2020 1004   GLUCOSE 161 (H) 10/09/2014 0846   BUN 27 (H) 02/05/2020 1004   BUN 14 10/09/2014 0846   CREATININE 0.97 02/05/2020 1004   CREATININE 0.70 10/09/2014 0846   CALCIUM 9.6 02/05/2020 1004   CALCIUM 9.1 10/09/2014 0846   PROT 8.2 (H) 02/05/2020 1004   PROT 7.3 10/09/2014 0846   ALBUMIN 4.3 02/05/2020 1004   ALBUMIN 3.6 10/09/2014 0846   AST 18 02/05/2020 1004   AST 16 10/09/2014 0846   ALT 15 02/05/2020 1004   ALT 13 (L) 10/09/2014 0846   ALKPHOS 68 02/05/2020 1004   ALKPHOS 70 10/09/2014 0846   BILITOT 0.5 02/05/2020 1004   BILITOT 1.0 10/09/2014 0846   GFRNONAA 53 (L) 02/05/2020 1004   GFRNONAA >60 10/09/2014 0846   GFRAA >60 02/05/2020 1004   GFRAA >60 10/09/2014 0846    No results found for: SPEP, UPEP  Lab Results  Component Value Date   WBC 7.9 02/05/2020   NEUTROABS 4.8 02/05/2020   HGB 12.5 02/05/2020   HCT 36.8 02/05/2020   MCV 86.2 02/05/2020    PLT 261 02/05/2020      Chemistry      Component Value Date/Time   NA 139 02/05/2020 1004   NA 130 (L) 10/09/2014 0846   K 4.3 02/05/2020 1004   K 4.1 10/09/2014 0846   CL 103 02/05/2020 1004   CL 98 (L) 10/09/2014 0846   CO2 26 02/05/2020 1004   CO2 25 10/09/2014 0846   BUN 27 (H) 02/05/2020 1004   BUN 14 10/09/2014 0846   CREATININE 0.97 02/05/2020 1004   CREATININE 0.70 10/09/2014 0846   GLU 111 03/18/2014 1048      Component Value Date/Time   CALCIUM 9.6 02/05/2020 1004   CALCIUM 9.1 10/09/2014 0846   ALKPHOS 68 02/05/2020 1004   ALKPHOS 70 10/09/2014 0846   AST 18 02/05/2020 1004   AST 16 10/09/2014 0846   ALT 15 02/05/2020 1004   ALT 13 (L) 10/09/2014 0846   BILITOT 0.5 02/05/2020 1004   BILITOT 1.0 10/09/2014 0846       RADIOGRAPHIC STUDIES: I have personally reviewed the radiological images as listed and agreed with the findings in the report. CT CHEST WO CONTRAST  Result Date: 02/04/2020 CLINICAL DATA:  Stage IV right upper lobe lung cancer status post chemo radiation therapy and immunotherapy. Currently on surveillance. Restaging. EXAM: CT CHEST WITHOUT CONTRAST TECHNIQUE: Multidetector CT imaging of the chest was performed following the standard protocol without IV contrast. COMPARISON:  01/28/2019 chest CT. FINDINGS: Cardiovascular: Normal heart size. Stable trace pericardial effusion/thickening. Three-vessel coronary atherosclerosis. Atherosclerotic nonaneurysmal thoracic aorta. Normal caliber pulmonary arteries. Mediastinum/Nodes: Heterogeneous thyroid gland without discrete thyroid nodules, unchanged. Unremarkable esophagus. No pathologically enlarged axillary, mediastinal or hilar lymph nodes, noting limited sensitivity for the detection of hilar adenopathy on this noncontrast study. Stable calcified nonenlarged right lower paratracheal and AP window nodes. Lungs/Pleura: No pneumothorax. No pleural effusion. Sharply marginated patchy paramediastinal  consolidation, reticulation and ground-glass opacity in the right greater than left mid to upper lungs with associated volume loss, distortion and bronchiectasis, stable, compatible with radiation fibrosis. No acute consolidative airspace disease  or new significant pulmonary nodules. Upper abdomen: Small hiatal hernia.  Cholecystectomy. Musculoskeletal: No aggressive appearing focal osseous lesions. Mild thoracic spondylosis. IMPRESSION: 1. 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 2. No evidence of metastatic disease in the chest. 3. Three-vessel coronary atherosclerosis. 4. Small hiatal hernia. 5. Aortic Atherosclerosis (ICD10-I70.0). Electronically Signed   By: Ilona Sorrel M.D.   On: 02/04/2020 13:47     ASSESSMENT & PLAN:  Malignant neoplasm of right upper lobe of lung ([place]) # Right upper lobe lung cancer adeno ca- Stage IV [contralateral left lower lobe lung nodule] status post chemoradiation-s/p Nivo [aslt 2015].  Currently on surveillance. CT scan August 2021-bilateral parahilar right more than left scarring suggestive of radiation changes; no evidence of any metastatic disease noted. Continue surveillance on annual basis. Will order imaging at next visit.  # Mild  Anemia- improved- Hb 12.5; continue PO iron every other day.   # FBG- 126; recommend dietary discretion no diagnosed with diabetes. Consider checking hemoglobin A1c with PCP  # HTN-systolic [id number] checking blood pressure at home; keeping a log of blood pressures-in reaching out to PCP that consistently elevated.  # DISPOSITION:  # Follow up in 6 months- MD; labs-cbc/cmp -Dr.B  # I reviewed the blood work- with the patient in detail; also reviewed the imaging independently [as summarized above]; and with the patient in detail.    Cc; Dr.Scott     Orders Placed This Encounter  Procedures  . CBC with Differential    Standing Status:   Future    Standing  Expiration Date:   02/04/2021  . Comprehensive metabolic panel    Standing Status:   Future    Standing Expiration Date:   02/04/2021   All questions were answered. The patient knows to call the clinic with any problems, questions or concerns.      Cammie Sickle, MD 02/05/2020 12:45 PM

## 2020-02-05 NOTE — Assessment & Plan Note (Signed)
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  Currently on surveillance. CT scan August 2021-bilateral parahilar right more than left scarring suggestive of radiation changes; no evidence of any metastatic disease noted. Continue surveillance on annual basis. Will order imaging at next visit.  # Mild  Anemia- improved- Hb 12.5; continue PO iron every other day.   # FBG- 126; recommend dietary discretion no diagnosed with diabetes. Consider checking hemoglobin A1c with PCP  # HTN-systolic [id number] checking blood pressure at home; keeping a log of blood pressures-in reaching out to PCP that consistently elevated.  # DISPOSITION:  # Follow up in 6 months- MD; labs-cbc/cmp -Dr.B  # I reviewed the blood work- with the patient in detail; also reviewed the imaging independently [as summarized above]; and with the patient in detail.    Cc; Dr.Scott

## 2020-03-06 ENCOUNTER — Other Ambulatory Visit: Payer: Self-pay

## 2020-03-06 ENCOUNTER — Ambulatory Visit (INDEPENDENT_AMBULATORY_CARE_PROVIDER_SITE_OTHER): Payer: Medicare PPO

## 2020-03-06 DIAGNOSIS — Z23 Encounter for immunization: Secondary | ICD-10-CM

## 2020-03-09 ENCOUNTER — Other Ambulatory Visit: Payer: Self-pay | Admitting: Internal Medicine

## 2020-04-15 ENCOUNTER — Other Ambulatory Visit: Payer: Self-pay | Admitting: Internal Medicine

## 2020-04-28 DIAGNOSIS — H4301 Vitreous prolapse, right eye: Secondary | ICD-10-CM | POA: Diagnosis not present

## 2020-04-28 DIAGNOSIS — Z961 Presence of intraocular lens: Secondary | ICD-10-CM | POA: Diagnosis not present

## 2020-04-28 DIAGNOSIS — H353131 Nonexudative age-related macular degeneration, bilateral, early dry stage: Secondary | ICD-10-CM | POA: Diagnosis not present

## 2020-04-28 DIAGNOSIS — H40003 Preglaucoma, unspecified, bilateral: Secondary | ICD-10-CM | POA: Diagnosis not present

## 2020-05-11 ENCOUNTER — Ambulatory Visit (INDEPENDENT_AMBULATORY_CARE_PROVIDER_SITE_OTHER): Payer: Medicare PPO | Admitting: Internal Medicine

## 2020-05-11 ENCOUNTER — Other Ambulatory Visit: Payer: Self-pay

## 2020-05-11 ENCOUNTER — Encounter: Payer: Self-pay | Admitting: Internal Medicine

## 2020-05-11 VITALS — BP 130/72 | HR 92 | Temp 97.7°F | Resp 16 | Ht 63.0 in | Wt 157.4 lb

## 2020-05-11 DIAGNOSIS — M810 Age-related osteoporosis without current pathological fracture: Secondary | ICD-10-CM | POA: Diagnosis not present

## 2020-05-11 DIAGNOSIS — Z85118 Personal history of other malignant neoplasm of bronchus and lung: Secondary | ICD-10-CM | POA: Diagnosis not present

## 2020-05-11 DIAGNOSIS — I714 Abdominal aortic aneurysm, without rupture, unspecified: Secondary | ICD-10-CM

## 2020-05-11 DIAGNOSIS — D649 Anemia, unspecified: Secondary | ICD-10-CM | POA: Diagnosis not present

## 2020-05-11 DIAGNOSIS — R059 Cough, unspecified: Secondary | ICD-10-CM

## 2020-05-11 DIAGNOSIS — K219 Gastro-esophageal reflux disease without esophagitis: Secondary | ICD-10-CM

## 2020-05-11 DIAGNOSIS — I10 Essential (primary) hypertension: Secondary | ICD-10-CM

## 2020-05-11 DIAGNOSIS — Z0000 Encounter for general adult medical examination without abnormal findings: Secondary | ICD-10-CM

## 2020-05-11 DIAGNOSIS — R739 Hyperglycemia, unspecified: Secondary | ICD-10-CM | POA: Diagnosis not present

## 2020-05-11 DIAGNOSIS — E7800 Pure hypercholesterolemia, unspecified: Secondary | ICD-10-CM | POA: Diagnosis not present

## 2020-05-11 LAB — LIPID PANEL
Cholesterol: 162 mg/dL (ref 0–200)
HDL: 38.5 mg/dL — ABNORMAL LOW (ref >39.00)
LDL Cholesterol: 96 mg/dL (ref 0–99)
NonHDL: 123.93
Total CHOL/HDL Ratio: 4
Triglycerides: 140 mg/dL (ref 0.0–149.0)
VLDL: 28 mg/dL (ref 0.0–40.0)

## 2020-05-11 LAB — TSH: TSH: 0.58 u[IU]/mL (ref 0.35–4.50)

## 2020-05-11 LAB — CBC WITH DIFFERENTIAL/PLATELET
Basophils Absolute: 0.1 10*3/uL (ref 0.0–0.1)
Basophils Relative: 0.7 % (ref 0.0–3.0)
Eosinophils Absolute: 0.1 10*3/uL (ref 0.0–0.7)
Eosinophils Relative: 1.6 % (ref 0.0–5.0)
HCT: 37 % (ref 36.0–46.0)
Hemoglobin: 12.7 g/dL (ref 12.0–15.0)
Lymphocytes Relative: 25.5 % (ref 12.0–46.0)
Lymphs Abs: 2.1 10*3/uL (ref 0.7–4.0)
MCHC: 34.2 g/dL (ref 30.0–36.0)
MCV: 85.8 fl (ref 78.0–100.0)
Monocytes Absolute: 0.5 10*3/uL (ref 0.1–1.0)
Monocytes Relative: 6.6 % (ref 3.0–12.0)
Neutro Abs: 5.4 10*3/uL (ref 1.4–7.7)
Neutrophils Relative %: 65.6 % (ref 43.0–77.0)
Platelets: 296 10*3/uL (ref 150.0–400.0)
RBC: 4.31 Mil/uL (ref 3.87–5.11)
RDW: 13.3 % (ref 11.5–15.5)
WBC: 8.2 10*3/uL (ref 4.0–10.5)

## 2020-05-11 LAB — IBC + FERRITIN
Ferritin: 51.8 ng/mL (ref 10.0–291.0)
Iron: 75 ug/dL (ref 42–145)
Saturation Ratios: 20.4 % (ref 20.0–50.0)
Transferrin: 263 mg/dL (ref 212.0–360.0)

## 2020-05-11 LAB — BASIC METABOLIC PANEL
BUN: 18 mg/dL (ref 6–23)
CO2: 30 mEq/L (ref 19–32)
Calcium: 10.3 mg/dL (ref 8.4–10.5)
Chloride: 101 mEq/L (ref 96–112)
Creatinine, Ser: 0.97 mg/dL (ref 0.40–1.20)
GFR: 52.59 mL/min — ABNORMAL LOW (ref >60.00)
Glucose, Bld: 112 mg/dL — ABNORMAL HIGH (ref 70–99)
Potassium: 3.9 mEq/L (ref 3.5–5.1)
Sodium: 140 mEq/L (ref 135–145)

## 2020-05-11 LAB — HEPATIC FUNCTION PANEL
ALT: 16 U/L (ref 0–35)
AST: 16 U/L (ref 0–37)
Albumin: 4.3 g/dL (ref 3.5–5.2)
Alkaline Phosphatase: 66 U/L (ref 39–117)
Bilirubin, Direct: 0.1 mg/dL (ref 0.0–0.3)
Total Bilirubin: 0.5 mg/dL (ref 0.2–1.2)
Total Protein: 8.1 g/dL (ref 6.0–8.3)

## 2020-05-11 LAB — HEMOGLOBIN A1C: Hgb A1c MFr Bld: 6.7 % — ABNORMAL HIGH (ref 4.6–6.5)

## 2020-05-11 LAB — VITAMIN D 25 HYDROXY (VIT D DEFICIENCY, FRACTURES): VITD: 49.7 ng/mL (ref 30.00–100.00)

## 2020-05-11 MED ORDER — LEVOCETIRIZINE DIHYDROCHLORIDE 5 MG PO TABS: 5.0000 mg | ORAL_TABLET | Freq: Every evening | ORAL | 2 refills | Status: AC

## 2020-05-11 MED ORDER — FLUTICASONE PROPIONATE 50 MCG/ACT NA SUSP: 2.0000 | Freq: Every day | NASAL | 3 refills | Status: AC

## 2020-05-11 NOTE — Progress Notes (Signed)
Patient ID: Heather Cooper, female   DOB: 1930/08/25, 86 y.o.   MRN: [id number]   Subjective:    Patient ID: Heather Cooper, female    DOB: [birth year], 86 y.o.   MRN: [id number]  HPI This visit occurred during the SARS-CoV-2 public health emergency.  Safety protocols were in place, including screening questions prior to the visit, additional usage of staff PPE, and extensive cleaning of exam room while observing appropriate contact time as indicated for disinfecting solutions.  Patient here for her physical exam.  She reports she is doing relatively well.  She has noticed a cough - mostly at night.  Breathing overall stable. No increased chest congestion.  Was questioning if allergies contributing.  Just started taking an allergy pill at night - two nights ago.  Discussed flonase.  Acid reflux appears to be controlled.  Has not been taking protonix daily.  No chest pain.  No nausea, vomiting or dysphagia.  No bowel change.  Not taking iron.  Has been off since 01/2020.  Reported after up and down cooking - noticed leg cramps.  This is the only time she has noticed cramping.  Discussed staying hydrated.  Stretches.  She is watching her diet.  Has cut down on bread, potatoes and rice.     Past Medical History:  Diagnosis Date  . Chicken pox   . Hypercalcemia    familial hypocalciuric hypercalcemia  . Hypercholesterolemia   . Hypertension   . Lung cancer ([place])   . Osteoporosis   . Thyroid disease    Past Surgical History:  Procedure Laterality Date  . ABDOMINAL HYSTERECTOMY     partial  . CHOLECYSTECTOMY    . COLONOSCOPY WITH PROPOFOL N/A 04/17/2017   Procedure: COLONOSCOPY WITH PROPOFOL;  Surgeon: Manya Silvas, MD;  Location: [facility] ENDOSCOPY;  Service: Endoscopy;  Laterality: N/A;  . transvaginal hysterectomy  04/18/05   with anterior colporrhaphy   Family History  Problem Relation Age of Onset  . Stroke Mother   . Hypertension Mother   . Prostate cancer Father   . Cancer Father         prostate  . Heart disease Brother        s/p CABG  . Colon cancer Neg Hx    Social History   Socioeconomic History  . Marital status: Widowed    Spouse name: Not on file  . Number of children: 3  . Years of education: Not on file  . Highest education level: Not on file  Occupational History  . Not on file  Tobacco Use  . Smoking status: Former Smoker    Quit date: 06/13/1997    Years since quitting: 22.9  . Smokeless tobacco: Never Used  Vaping Use  . Vaping Use: Never used  Substance and Sexual Activity  . Alcohol use: No    Alcohol/week: 0.0 standard drinks  . Drug use: No  . Sexual activity: Not on file  Other Topics Concern  . Not on file  Social History Narrative   Lost her husband November 2019   Social Determinants of Health   Financial Resource Strain: Low Risk   . Difficulty of Paying Living Expenses: Not very hard  Food Insecurity: No Food Insecurity  . Worried About Charity fundraiser in the Last Year: Never true  . Ran Out of Food in the Last Year: Never true  Transportation Needs: No Transportation Needs  . Lack of Transportation (Medical): No  . Lack of Transportation (  Non-Medical): No  Physical Activity:   . Days of Exercise per Week: Not on file  . Minutes of Exercise per Session: Not on file  Stress: No Stress Concern Present  . Feeling of Stress : Not at all  Social Connections: Moderately Integrated  . Frequency of Communication with Friends and Family: More than three times a week  . Frequency of Social Gatherings with Friends and Family: More than three times a week  . Attends Religious Services: More than 4 times per year  . Active Member of Clubs or Organizations: Yes  . Attends Archivist Meetings: More than 4 times per year  . Marital Status: Widowed    Outpatient Encounter Medications as of 05/11/2020  Medication Sig  . Ascorbic Acid (VITAMIN C) 1000 MG tablet Take 1,000 mg by mouth daily.  . cholecalciferol  (VITAMIN D3) 25 MCG (1000 UNIT) tablet Take 1,000 Units by mouth daily.  . fluticasone (FLONASE) 50 MCG/ACT nasal spray Place 2 sprays into both nostrils daily.  [name] levocetirizine (XYZAL) 5 MG tablet Take 1 tablet (5 mg total) by mouth every evening.  . olmesartan-hydrochlorothiazide (BENICAR HCT) 20-12.5 MG tablet TAKE 1 TABLET BY MOUTH ONCE DAILY  . pantoprazole (PROTONIX) 40 MG tablet TAKE 1 TABLET BY MOUTH ONCE DAILY  . [DISCONTINUED] fluticasone (FLONASE) 50 MCG/ACT nasal spray Place into both nostrils daily.  . [DISCONTINUED] levocetirizine (XYZAL) 5 MG tablet Take 5 mg by mouth every evening.  . ferrous sulfate 325 (65 FE) MG tablet Take 325 mg by mouth daily with breakfast. (Patient not taking: Reported on 05/11/2020)   Facility-Administered Encounter Medications as of 05/11/2020  Medication  . sodium chloride 0.9 % 1,000 mL with potassium chloride 20 mEq, magnesium sulfate 2 g infusion    Review of Systems  Constitutional: Negative for appetite change and unexpected weight change.  HENT: Negative for congestion, sinus pressure and sore throat.   Eyes: Negative for pain and visual disturbance.  Respiratory: Positive for cough. Negative for chest tightness.        Breathing stable.   Cardiovascular: Negative for chest pain, palpitations and leg swelling.  Gastrointestinal: Negative for abdominal pain, diarrhea, nausea and vomiting.  Genitourinary: Negative for difficulty urinating and dysuria.  Musculoskeletal: Negative for joint swelling and myalgias.  Skin: Negative for color change and rash.  Neurological: Negative for dizziness, light-headedness and headaches.  Hematological: Negative for adenopathy. Does not bruise/bleed easily.  Psychiatric/Behavioral: Negative for agitation and dysphoric mood.       Objective:    Physical Exam Vitals reviewed.  Constitutional:      General: She is not in acute distress.    Appearance: Normal appearance. She is well-developed.  HENT:      Head: Normocephalic and atraumatic.     Right Ear: External ear normal.     Left Ear: External ear normal.  Eyes:     General: No scleral icterus.       Right eye: No discharge.        Left eye: No discharge.     Conjunctiva/sclera: Conjunctivae normal.  Neck:     Thyroid: No thyromegaly.  Cardiovascular:     Rate and Rhythm: Normal rate and regular rhythm.  Pulmonary:     Effort: No tachypnea, accessory muscle usage or respiratory distress.     Breath sounds: Normal breath sounds. No decreased breath sounds or wheezing.  Chest:     Breasts:        Right: No inverted nipple, mass, nipple  discharge or tenderness (no axillary adenopathy).        Left: No inverted nipple, mass, nipple discharge or tenderness (no axilarry adenopathy).  Abdominal:     General: Bowel sounds are normal.     Palpations: Abdomen is soft.     Tenderness: There is no abdominal tenderness.  Musculoskeletal:        General: No swelling or tenderness.     Cervical back: Neck supple. No tenderness.  Lymphadenopathy:     Cervical: No cervical adenopathy.  Skin:    Findings: No erythema or rash.  Neurological:     Mental Status: She is alert and oriented to person, place, and time.  Psychiatric:        Mood and Affect: Mood normal.        Behavior: Behavior normal.     BP 130/72   Pulse 92   Temp 97.7 F (36.5 C) (Oral)   Resp 16   Ht 5' 3"  (1.6 m)   Wt 157 lb 6.4 oz (71.4 kg)   SpO2 98%   BMI 27.88 kg/m  Wt Readings from Last 3 Encounters:  05/11/20 157 lb 6.4 oz (71.4 kg)  02/05/20 157 lb (71.2 kg)  01/16/20 156 lb 12.8 oz (71.1 kg)     Lab Results  Component Value Date   WBC 8.2 05/11/2020   HGB 12.7 05/11/2020   HCT 37.0 05/11/2020   PLT 296.0 05/11/2020   GLUCOSE 112 (H) 05/11/2020   CHOL 162 05/11/2020   TRIG 140.0 05/11/2020   HDL 38.50 (L) 05/11/2020   LDLCALC 96 05/11/2020   ALT 16 05/11/2020   AST 16 05/11/2020   NA 140 05/11/2020   K 3.9 05/11/2020   CL 101  05/11/2020   CREATININE 0.97 05/11/2020   BUN 18 05/11/2020   CO2 30 05/11/2020   TSH 0.58 05/11/2020   INR 1.0 03/18/2014   INR 1.0 03/18/2014   HGBA1C 6.7 (H) 05/11/2020    CT CHEST WO CONTRAST  Result Date: 02/04/2020 CLINICAL DATA:  Stage IV right upper lobe lung cancer status post chemo radiation therapy and immunotherapy. Currently on surveillance. Restaging. EXAM: CT CHEST WITHOUT CONTRAST TECHNIQUE: Multidetector CT imaging of the chest was performed following the standard protocol without IV contrast. COMPARISON:  01/28/2019 chest CT. FINDINGS: Cardiovascular: Normal heart size. Stable trace pericardial effusion/thickening. Three-vessel coronary atherosclerosis. Atherosclerotic nonaneurysmal thoracic aorta. Normal caliber pulmonary arteries. Mediastinum/Nodes: Heterogeneous thyroid gland without discrete thyroid nodules, unchanged. Unremarkable esophagus. No pathologically enlarged axillary, mediastinal or hilar lymph nodes, noting limited sensitivity for the detection of hilar adenopathy on this noncontrast study. Stable calcified nonenlarged right lower paratracheal and AP window nodes. Lungs/Pleura: No pneumothorax. No pleural effusion. Sharply marginated patchy paramediastinal consolidation, reticulation and ground-glass opacity in the right greater than left mid to upper lungs with associated volume loss, distortion and bronchiectasis, stable, compatible with radiation fibrosis. No acute consolidative airspace disease or new significant pulmonary nodules. Upper abdomen: Small hiatal hernia.  Cholecystectomy. Musculoskeletal: No aggressive appearing focal osseous lesions. Mild thoracic spondylosis. IMPRESSION: 1. 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 2. No evidence of metastatic disease in the chest. 3. Three-vessel coronary atherosclerosis. 4. Small hiatal hernia. 5. Aortic Atherosclerosis (ICD10-I70.0). Electronically Signed    By: Ilona Sorrel M.D.   On: 02/04/2020 13:47       Assessment & Plan:   Problem List Items Addressed This Visit    Osteoporosis    Discussed bone density. She  declines at this time.  Is taking calcium and vitamin D.  Check vitamin D level.       Relevant Orders   VITAMIN D 25 Hydroxy (Vit-D Deficiency, Fractures) (Completed)   Hypertension    Continue benicar/hctz.  Blood pressure controlled.  Follow pressures.  Follow metabolic panel.       Relevant Orders   TSH (Completed)   Basic metabolic panel (Completed)   Hyperglycemia    Low carb diet and exercise.  Follow met b and a1c.        Relevant Orders   Hemoglobin A1c (Completed)   Hypercholesterolemia    Low cholesterol diet and exercise.  Follow lipid panel.       Relevant Orders   Lipid panel (Completed)   Hepatic function panel (Completed)   History of lung cancer    Followed by Dr Rogue Bussing.  Evaluated 02/05/20 - stable. CT as outlined.  Follow.       Health care maintenance    Physical today 05/11/20.  Wanted to hold on mammogram.  Discussed the need for bone density.  She declines at this time.        GERD (gastroesophageal reflux disease)    Cough at night.  No acid reflux reported. She has been taking her protonix qod.  Will increase to q day and see if cough improves.  Follow.       Cough    Cough - mostly at night.  Persistent/intermittent.  Just had CT chest as outlined.  Increase protonix to daily.  flonase as directed.  Antihistamine as directed. Follow.  Notify me if persistent.        Anemia    Check cbc today.        Relevant Orders   CBC with Differential/Platelet (Completed)   IBC + Ferritin (Completed)   Abdominal aortic aneurysm ([place])    Evaluated 01/16/20 - Dr Delana Meyer - slight increase. Recommended f/u in 6 months.         Other Visit Diagnoses    Routine general medical examination at a health care facility    -  Primary       [name], MD

## 2020-05-11 NOTE — Patient Instructions (Signed)
Take protonix 30 minutes before your evening meal.  Take daily.   flonase nasal spray - 2 sprays each nostril one time per day.  Do this in the evening.

## 2020-05-11 NOTE — Assessment & Plan Note (Addendum)
Physical today 05/11/20.  Wanted to hold on mammogram.  Discussed the need for bone density.  She declines at this time.

## 2020-05-12 ENCOUNTER — Encounter: Payer: Self-pay | Admitting: Internal Medicine

## 2020-05-12 NOTE — Assessment & Plan Note (Signed)
Continue benicar/hctz.  Blood pressure controlled.  Follow pressures.  Follow metabolic panel.

## 2020-05-12 NOTE — Assessment & Plan Note (Signed)
Check cbc today 

## 2020-05-12 NOTE — Assessment & Plan Note (Signed)
Cough at night.  No acid reflux reported. She has been taking her protonix qod.  Will increase to q day and see if cough improves.  Follow.

## 2020-05-12 NOTE — Assessment & Plan Note (Signed)
Cough - mostly at night.  Persistent/intermittent.  Just had CT chest as outlined.  Increase protonix to daily.  flonase as directed.  Antihistamine as directed. Follow.  Notify me if persistent.

## 2020-05-12 NOTE — Assessment & Plan Note (Signed)
Evaluated 01/16/20 - Dr Delana Meyer - slight increase. Recommended f/u in 6 months.

## 2020-05-12 NOTE — Assessment & Plan Note (Signed)
Low cholesterol diet and exercise.  Follow lipid panel.   

## 2020-05-12 NOTE — Assessment & Plan Note (Signed)
Followed by Dr Rogue Bussing.  Evaluated 02/05/20 - stable. CT as outlined.  Follow.

## 2020-05-12 NOTE — Assessment & Plan Note (Signed)
Low carb diet and exercise.  Follow met b and a1c.   

## 2020-05-12 NOTE — Assessment & Plan Note (Signed)
Discussed bone density. She declines at this time.  Is taking calcium and vitamin D.  Check vitamin D level.

## 2020-06-15 ENCOUNTER — Telehealth (INDEPENDENT_AMBULATORY_CARE_PROVIDER_SITE_OTHER): Payer: Medicare PPO | Admitting: Internal Medicine

## 2020-06-15 ENCOUNTER — Telehealth: Payer: Self-pay

## 2020-06-15 DIAGNOSIS — U071 COVID-19: Secondary | ICD-10-CM

## 2020-06-15 DIAGNOSIS — Z85118 Personal history of other malignant neoplasm of bronchus and lung: Secondary | ICD-10-CM

## 2020-06-15 DIAGNOSIS — K219 Gastro-esophageal reflux disease without esophagitis: Secondary | ICD-10-CM | POA: Diagnosis not present

## 2020-06-15 DIAGNOSIS — I10 Essential (primary) hypertension: Secondary | ICD-10-CM

## 2020-06-15 NOTE — Telephone Encounter (Signed)
Do you want to add her on at end of day?

## 2020-06-15 NOTE — Telephone Encounter (Signed)
Heather Cooper said both her and mom tested positive for covid on Friday 06/12/20. Pt is doing ok but has a mild cough. She wants to know if Dr. Nicki Reaper has any recommendations or do she thinks pt needs prednisone? She said please call her at [phone].

## 2020-06-15 NOTE — Progress Notes (Signed)
Patient ID: Heather Cooper, female   DOB: 06/18/1930, 87 y.o.   MRN: [id number]   Virtual Visit via video Note  This visit type was conducted due to national recommendations for restrictions regarding the COVID-19 pandemic (e.g. social distancing).  This format is felt to be most appropriate for this patient at this time.  All issues noted in this document were discussed and addressed.  No physical exam was performed (except for noted visual exam findings with Video Visits).   I connected with Heather Cooper by a video enabled telemedicine application and verified that I am speaking with the correct person using two identifiers. Location patient: home Location provider: work Persons participating in the virtual visit: patient, provider and pts daughter Heather Cooper)  The limitations, risks, security and privacy concerns of performing an evaluation and management service by video and the availability of in person appointments have been discussed.  it has also been discussed with the patient that there may be a patient responsible charge related to this service. The patient expressed understanding and agreed to proceed.   Reason for visit: work in appt  HPI: covid positive.  Both Ms Yerby and her daughter tested positive 06/12/20.  Reports some aching and cough initially.  Took tylenol - resolved.  No fever.  Is up and walking around.  No chest pain or sob.  No chest congestion.  No nausea or vomiting.  No diarrhea. Eating.  Taking vitamin D, zinc and vitamin C.  Taking delsym at night.  Using flonase.  Pulse ox 98-99%.  Vaccinated.     ROS: See pertinent positives and negatives per HPI.  Past Medical History:  Diagnosis Date  . Chicken pox   . Hypercalcemia    familial hypocalciuric hypercalcemia  . Hypercholesterolemia   . Hypertension   . Lung cancer ([employer])   . Osteoporosis   . Thyroid disease     Past Surgical History:  Procedure Laterality Date  . ABDOMINAL HYSTERECTOMY      partial  . CHOLECYSTECTOMY    . COLONOSCOPY WITH PROPOFOL N/A 04/17/2017   Procedure: COLONOSCOPY WITH PROPOFOL;  Surgeon: Manya Silvas, MD;  Location: [facility] ENDOSCOPY;  Service: Endoscopy;  Laterality: N/A;  . transvaginal hysterectomy  04/18/05   with anterior colporrhaphy    Family History  Problem Relation Age of Onset  . Stroke Mother   . Hypertension Mother   . Prostate cancer Father   . Cancer Father        prostate  . Heart disease Brother        s/p CABG  . Colon cancer Neg Hx     SOCIAL HX: reviewed.    Current Outpatient Medications:  .  Ascorbic Acid (VITAMIN C) 1000 MG tablet, Take 1,000 mg by mouth daily., Disp: , Rfl:  .  cholecalciferol (VITAMIN D3) 25 MCG (1000 UNIT) tablet, Take 1,000 Units by mouth daily., Disp: , Rfl:  .  ferrous sulfate 325 (65 FE) MG tablet, Take 325 mg by mouth daily with breakfast. (Patient not taking: Reported on 05/11/2020), Disp: , Rfl:  .  fluticasone (FLONASE) 50 MCG/ACT nasal spray, Place 2 sprays into both nostrils daily., Disp: 16 g, Rfl: 3 .  levocetirizine (XYZAL) 5 MG tablet, Take 1 tablet (5 mg total) by mouth every evening., Disp: 90 tablet, Rfl: 2 .  olmesartan-hydrochlorothiazide (BENICAR HCT) 20-12.5 MG tablet, TAKE 1 TABLET BY MOUTH ONCE DAILY, Disp: 90 tablet, Rfl: 1 .  pantoprazole (PROTONIX) 40 MG tablet, TAKE 1 TABLET BY  MOUTH ONCE DAILY, Disp: 90 tablet, Rfl: 3 No current facility-administered medications for this visit.  Facility-Administered Medications Ordered in Other Visits:  .  sodium chloride 0.9 % 1,000 mL with potassium chloride 20 mEq, magnesium sulfate 2 g infusion, , Intravenous, Continuous, Choksi, Janak, MD, Stopped at 12/04/14 1300  EXAM:  VITALS per patient if applicable: 95-74%  GENERAL: alert, oriented, appears well and in no acute distress  HEENT: atraumatic, conjunttiva clear, no obvious abnormalities on inspection of external nose and ears  NECK: normal movements of the head and  neck  LUNGS: on inspection no signs of respiratory distress, breathing rate appears normal, no obvious gross SOB, gasping or wheezing  CV: no obvious cyanosis  PSYCH/NEURO: pleasant and cooperative, no obvious depression or anxiety, speech and thought processing grossly intact  ASSESSMENT AND PLAN:  Discussed the following assessment and plan:  Problem List Items Addressed This Visit    Hypertension    Continues on benicar/hctz.  Blood pressure has been doing well.  Continue current medication regimen.        GERD (gastroesophageal reflux disease)    No acid reflux reported.  Continue protonix.       History of lung cancer    Followed by Dr Rogue Bussing.  Evaluated 01/2020 - stable.  Follow.       COVID-19 virus infection    covid infection.  Has been vaccinated. Symptoms as outlined.  Previous aching.  Cough.  Taking tylenol.  Delsym.  Feeling better.  Rest.  Fluids.  Follow closely.  Call with update.           I discussed the assessment and treatment plan with the patient. The patient was provided an opportunity to ask questions and all were answered. The patient agreed with the plan and demonstrated an understanding of the instructions.   The patient was advised to call back or seek an in-person evaluation if the symptoms worsen or if the condition fails to improve as anticipated.    Heather Pheasant, MD

## 2020-06-15 NOTE — Telephone Encounter (Signed)
Patient is scheduled   

## 2020-06-15 NOTE — Telephone Encounter (Signed)
Ok

## 2020-06-21 ENCOUNTER — Encounter: Payer: Self-pay | Admitting: Internal Medicine

## 2020-06-21 DIAGNOSIS — U071 COVID-19: Secondary | ICD-10-CM | POA: Insufficient documentation

## 2020-06-21 NOTE — Assessment & Plan Note (Signed)
Continues on benicar/hctz.  Blood pressure has been doing well.  Continue current medication regimen.

## 2020-06-21 NOTE — Assessment & Plan Note (Signed)
covid infection.  Has been vaccinated. Symptoms as outlined.  Previous aching.  Cough.  Taking tylenol.  Delsym.  Feeling better.  Rest.  Fluids.  Follow closely.  Call with update.

## 2020-06-21 NOTE — Assessment & Plan Note (Signed)
No acid reflux reported.  Continue protonix.

## 2020-06-21 NOTE — Assessment & Plan Note (Signed)
Followed by Dr Rogue Bussing.  Evaluated 01/2020 - stable.  Follow.

## 2020-06-22 ENCOUNTER — Telehealth: Payer: Self-pay | Admitting: *Deleted

## 2020-06-22 NOTE — Telephone Encounter (Signed)
Spoke with patient. Dr. Rogue Bussing would like to hold off ordering a CT scan at this time. He will discuss this at her next apt in Feb. Patient aware of the plan of care and gave verbal understanding. She thanked me for calling her back.

## 2020-06-22 NOTE — Telephone Encounter (Signed)
Patient called reporting that she has a lb. physician appointment 2/25 and is asking if she can have a CT scan before that appointment as she has had COVID but is over it. Please advise

## 2020-07-16 ENCOUNTER — Ambulatory Visit (INDEPENDENT_AMBULATORY_CARE_PROVIDER_SITE_OTHER): Payer: Medicare PPO | Admitting: Vascular Surgery

## 2020-07-16 ENCOUNTER — Encounter (INDEPENDENT_AMBULATORY_CARE_PROVIDER_SITE_OTHER): Payer: Medicare PPO

## 2020-07-30 ENCOUNTER — Encounter (INDEPENDENT_AMBULATORY_CARE_PROVIDER_SITE_OTHER): Payer: Medicare PPO | Admitting: Ophthalmology

## 2020-07-30 ENCOUNTER — Other Ambulatory Visit: Payer: Self-pay

## 2020-07-30 DIAGNOSIS — H43813 Vitreous degeneration, bilateral: Secondary | ICD-10-CM | POA: Diagnosis not present

## 2020-07-30 DIAGNOSIS — H35033 Hypertensive retinopathy, bilateral: Secondary | ICD-10-CM | POA: Diagnosis not present

## 2020-07-30 DIAGNOSIS — I10 Essential (primary) hypertension: Secondary | ICD-10-CM

## 2020-07-30 DIAGNOSIS — H353132 Nonexudative age-related macular degeneration, bilateral, intermediate dry stage: Secondary | ICD-10-CM | POA: Diagnosis not present

## 2020-08-05 ENCOUNTER — Other Ambulatory Visit (INDEPENDENT_AMBULATORY_CARE_PROVIDER_SITE_OTHER): Payer: Self-pay | Admitting: Vascular Surgery

## 2020-08-05 DIAGNOSIS — I714 Abdominal aortic aneurysm, without rupture, unspecified: Secondary | ICD-10-CM

## 2020-08-06 ENCOUNTER — Ambulatory Visit (INDEPENDENT_AMBULATORY_CARE_PROVIDER_SITE_OTHER): Payer: Medicare PPO

## 2020-08-06 ENCOUNTER — Ambulatory Visit (INDEPENDENT_AMBULATORY_CARE_PROVIDER_SITE_OTHER): Payer: Medicare PPO | Admitting: Vascular Surgery

## 2020-08-06 ENCOUNTER — Encounter (INDEPENDENT_AMBULATORY_CARE_PROVIDER_SITE_OTHER): Payer: Self-pay | Admitting: Vascular Surgery

## 2020-08-06 ENCOUNTER — Other Ambulatory Visit: Payer: Self-pay

## 2020-08-06 VITALS — BP 134/68 | HR 83 | Ht 63.0 in | Wt 151.0 lb

## 2020-08-06 DIAGNOSIS — I10 Essential (primary) hypertension: Secondary | ICD-10-CM | POA: Diagnosis not present

## 2020-08-06 DIAGNOSIS — I714 Abdominal aortic aneurysm, without rupture, unspecified: Secondary | ICD-10-CM

## 2020-08-06 DIAGNOSIS — E7800 Pure hypercholesterolemia, unspecified: Secondary | ICD-10-CM

## 2020-08-06 DIAGNOSIS — K219 Gastro-esophageal reflux disease without esophagitis: Secondary | ICD-10-CM | POA: Diagnosis not present

## 2020-08-06 NOTE — Progress Notes (Signed)
MRN : [id number]  Heather Cooper is a 87 y.o. (Oct 28, 1930) female who presents with chief complaint of  Chief Complaint  Patient presents with  . Follow-up    6 Mo U/S   .  History of Present Illness:   The patient returns to the office for surveillance of a known abdominal aortic aneurysm. Patient denies abdominal pain or back pain, no other abdominal complaints. No changes suggesting embolic episodes.   There have been no interval changes in the patient's overall health care since his last visit.  Patient denies amaurosis fugax or TIA symptoms. There is no history of claudication or rest pain symptoms of the lower extremities. The patient denies angina or shortness of breath.   Duplex US of the aorta and iliac arteries shows an AAA measured3.94cm. This is a change compared to last study which measured her AAA at 4.68 cm   Current Meds  Medication Sig  . Ascorbic Acid (VITAMIN C) 1000 MG tablet Take 1,000 mg by mouth daily.  . ferrous sulfate 325 (65 FE) MG tablet Take 325 mg by mouth daily with breakfast.  . fluticasone (FLONASE) 50 MCG/ACT nasal spray Place 2 sprays into both nostrils daily.  [name] levocetirizine (XYZAL) 5 MG tablet Take 1 tablet (5 mg total) by mouth every evening.  . olmesartan-hydrochlorothiazide (BENICAR HCT) 20-12.5 MG tablet TAKE 1 TABLET BY MOUTH ONCE DAILY  . pantoprazole (PROTONIX) 40 MG tablet TAKE 1 TABLET BY MOUTH ONCE DAILY    Past Medical History:  Diagnosis Date  . Chicken pox   . Hypercalcemia    familial hypocalciuric hypercalcemia  . Hypercholesterolemia   . Hypertension   . Lung cancer ([place])   . Osteoporosis   . Thyroid disease     Past Surgical History:  Procedure Laterality Date  . ABDOMINAL HYSTERECTOMY     partial  . CHOLECYSTECTOMY    . COLONOSCOPY WITH PROPOFOL N/A 04/17/2017   Procedure: COLONOSCOPY WITH PROPOFOL;  Surgeon: Manya Silvas, MD;  Location: [facility] ENDOSCOPY;  Service: Endoscopy;  Laterality: N/A;  .  transvaginal hysterectomy  04/18/05   with anterior colporrhaphy    Social History Social History   Tobacco Use  . Smoking status: Former Smoker    Quit date: 06/13/1997    Years since quitting: 23.1  . Smokeless tobacco: Never Used  Vaping Use  . Vaping Use: Never used  Substance Use Topics  . Alcohol use: No    Alcohol/week: 0.0 standard drinks  . Drug use: No    Family History Family History  Problem Relation Age of Onset  . Stroke Mother   . Hypertension Mother   . Prostate cancer Father   . Cancer Father        prostate  . Heart disease Brother        s/p CABG  . Colon cancer Neg Hx     Allergies  Allergen Reactions  . Paclitaxel Other (See Comments)    Chest tightness     REVIEW OF SYSTEMS (Negative unless checked)  Constitutional: [] Weight loss  [] Fever  [] Chills Cardiac: [] Chest pain   [] Chest pressure   [] Palpitations   [] Shortness of breath when laying flat   [] Shortness of breath with exertion. Vascular:  [] Pain in legs with walking   [] Pain in legs at rest  [] History of DVT   [] Phlebitis   [] Swelling in legs   [] Varicose veins   [] Non-healing ulcers Pulmonary:   [] Uses home oxygen   [] Productive cough   []   Hemoptysis   [] Wheeze  [] COPD   [] Asthma Neurologic:  [] Dizziness   [] Seizures   [] History of stroke   [] History of TIA  [] Aphasia   [] Vissual changes   [] Weakness or numbness in arm   [] Weakness or numbness in leg Musculoskeletal:   [] Joint swelling   [] Joint pain   [] Low back pain Hematologic:  [] Easy bruising  [] Easy bleeding   [] Hypercoagulable state   [] Anemic Gastrointestinal:  [] Diarrhea   [] Vomiting  [x] Gastroesophageal reflux/heartburn   [] Difficulty swallowing. Genitourinary:  [] Chronic kidney disease   [] Difficult urination  [] Frequent urination   [] Blood in urine Skin:  [] Rashes   [] Ulcers  Psychological:  [] History of anxiety   []  History of major depression.  Physical Examination  Vitals:   08/06/20 0826  BP: 134/68  Pulse: 83   Weight: 151 lb (68.5 kg)  Height: 5\' 3"  (1.6 m)   Body mass index is 26.75 kg/m. Gen: WD/WN, NAD Head: [place]/AT, No temporalis wasting.  Ear/Nose/Throat: Hearing grossly intact, nares w/o erythema or drainage Eyes: PER, EOMI, sclera nonicteric.  Neck: Supple, no large masses.   Pulmonary:  Good air movement, no audible wheezing bilaterally, no use of accessory muscles.  Cardiac: RRR, no JVD Vascular: no carotid bruits Vessel Right Left  Radial Palpable Palpable  Carotid Palpable Palpable  Gastrointestinal: Non-distended. No guarding/no peritoneal signs.  Musculoskeletal: M/S 5/5 throughout.  No deformity or atrophy.  Neurologic: CN 2-12 intact. Symmetrical.  Speech is fluent. Motor exam as listed above. Psychiatric: Judgment intact, Mood & affect appropriate for pt's clinical situation. Dermatologic: No rashes or ulcers noted.  No changes consistent with cellulitis.  CBC Lab Results  Component Value Date   WBC 8.2 05/11/2020   HGB 12.7 05/11/2020   HCT 37.0 05/11/2020   MCV 85.8 05/11/2020   PLT 296.0 05/11/2020    BMET    Component Value Date/Time   NA 140 05/11/2020 1101   NA 130 (L) 10/09/2014 0846   K 3.9 05/11/2020 1101   K 4.1 10/09/2014 0846   CL 101 05/11/2020 1101   CL 98 (L) 10/09/2014 0846   CO2 30 05/11/2020 1101   CO2 25 10/09/2014 0846   GLUCOSE 112 (H) 05/11/2020 1101   GLUCOSE 161 (H) 10/09/2014 0846   BUN 18 05/11/2020 1101   BUN 14 10/09/2014 0846   CREATININE 0.97 05/11/2020 1101   CREATININE 0.70 10/09/2014 0846   CALCIUM 10.3 05/11/2020 1101   CALCIUM 9.1 10/09/2014 0846   GFRNONAA 53 (L) 02/05/2020 1004   GFRNONAA >60 10/09/2014 0846   GFRAA >60 02/05/2020 1004   GFRAA >60 10/09/2014 0846   CrCl cannot be calculated (Patient's most recent lab result is older than the maximum 21 days allowed.).  COAG Lab Results  Component Value Date   INR 1.0 03/18/2014   INR 1.0 03/18/2014   PROTIME 13.2 03/18/2014    Radiology No results  found.   Assessment/Plan 1. Abdominal aortic aneurysm (AAA) without rupture ([place]) No surgery or intervention at this time. The patient has an asymptomatic abdominal aortic aneurysm that is less than 4 cm in maximal diameter.  I have discussed the natural history of abdominal aortic aneurysm and the small risk of rupture for aneurysm less than 5 cm in size.  However, as these small aneurysms tend to enlarge over time, continued surveillance with ultrasound or CT scan is mandatory.  I have also discussed optimizing medical management with hypertension and lipid control and the importance of abstinence from tobacco.  The patient is  also encouraged to exercise a minimum of 30 minutes 4 times a week.  Should the patient develop new onset abdominal or back pain or signs of peripheral embolization they are instructed to seek medical attention immediately and to alert the physician providing care that they have an aneurysm.  The patient voices their understanding. The patient will return in 12 months with an aortic duplex.  - VAS US AORTA/IVC/ILIACS; Future  2. Primary hypertension Continue antihypertensive medications as already ordered, these medications have been reviewed and there are no changes at this time.   3. Hypercholesterolemia Continue statin as ordered and reviewed, no changes at this time   4. Gastroesophageal reflux disease, unspecified whether esophagitis present Continue PPI as already ordered, this medication has been reviewed and there are no changes at this time.  Avoidence of caffeine and alcohol  Moderate elevation of the head of the bed    Hortencia Pilar, MD  08/06/2020 8:41 AM

## 2020-08-07 ENCOUNTER — Inpatient Hospital Stay: Payer: Medicare PPO | Attending: Internal Medicine

## 2020-08-07 ENCOUNTER — Ambulatory Visit
Admission: RE | Admit: 2020-08-07 | Discharge: 2020-08-07 | Disposition: A | Discharge status: Home / Self Care | Payer: Medicare PPO | Attending: Internal Medicine | Admitting: Internal Medicine

## 2020-08-07 ENCOUNTER — Ambulatory Visit: Payer: Medicare PPO | Admitting: Internal Medicine

## 2020-08-07 ENCOUNTER — Inpatient Hospital Stay (HOSPITAL_BASED_OUTPATIENT_CLINIC_OR_DEPARTMENT_OTHER): Payer: Medicare PPO | Admitting: Internal Medicine

## 2020-08-07 ENCOUNTER — Ambulatory Visit
Admission: RE | Admit: 2020-08-07 | Discharge: 2020-08-07 | Disposition: A | Discharge status: Home / Self Care | Payer: Medicare PPO | Source: Ambulatory Visit | Attending: Internal Medicine | Admitting: Internal Medicine

## 2020-08-07 VITALS — BP 137/65 | HR 85 | Temp 96.8°F | Resp 20 | Ht 63.0 in | Wt 152.8 lb

## 2020-08-07 DIAGNOSIS — C3411 Malignant neoplasm of upper lobe, right bronchus or lung: Secondary | ICD-10-CM | POA: Diagnosis not present

## 2020-08-07 DIAGNOSIS — Z923 Personal history of irradiation: Secondary | ICD-10-CM | POA: Diagnosis not present

## 2020-08-07 DIAGNOSIS — Z87891 Personal history of nicotine dependence: Secondary | ICD-10-CM | POA: Insufficient documentation

## 2020-08-07 DIAGNOSIS — D649 Anemia, unspecified: Secondary | ICD-10-CM | POA: Diagnosis not present

## 2020-08-07 DIAGNOSIS — Z85118 Personal history of other malignant neoplasm of bronchus and lung: Secondary | ICD-10-CM | POA: Diagnosis not present

## 2020-08-07 DIAGNOSIS — E079 Disorder of thyroid, unspecified: Secondary | ICD-10-CM | POA: Insufficient documentation

## 2020-08-07 DIAGNOSIS — I10 Essential (primary) hypertension: Secondary | ICD-10-CM | POA: Insufficient documentation

## 2020-08-07 DIAGNOSIS — Z79899 Other long term (current) drug therapy: Secondary | ICD-10-CM | POA: Diagnosis not present

## 2020-08-07 DIAGNOSIS — Z9221 Personal history of antineoplastic chemotherapy: Secondary | ICD-10-CM | POA: Insufficient documentation

## 2020-08-07 DIAGNOSIS — R059 Cough, unspecified: Secondary | ICD-10-CM | POA: Diagnosis not present

## 2020-08-07 DIAGNOSIS — R053 Chronic cough: Secondary | ICD-10-CM | POA: Diagnosis not present

## 2020-08-07 LAB — COMPREHENSIVE METABOLIC PANEL
ALT: 16 U/L (ref 0–44)
AST: 20 U/L (ref 15–41)
Albumin: 4 g/dL (ref 3.5–5.0)
Alkaline Phosphatase: 67 U/L (ref 38–126)
Anion gap: 13 (ref 5–15)
BUN: 24 mg/dL — ABNORMAL HIGH (ref 8–23)
CO2: 24 mmol/L (ref 22–32)
Calcium: 9.6 mg/dL (ref 8.9–10.3)
Chloride: 100 mmol/L (ref 98–111)
Creatinine, Ser: 1.06 mg/dL — ABNORMAL HIGH (ref 0.44–1.00)
GFR, Estimated: 51 mL/min — ABNORMAL LOW (ref >60)
Glucose, Bld: 132 mg/dL — ABNORMAL HIGH (ref 70–99)
Potassium: 3.8 mmol/L (ref 3.5–5.1)
Sodium: 137 mmol/L (ref 135–145)
Total Bilirubin: 0.7 mg/dL (ref 0.3–1.2)
Total Protein: 8.1 g/dL (ref 6.5–8.1)

## 2020-08-07 LAB — CBC WITH DIFFERENTIAL/PLATELET
Abs Immature Granulocytes: 0.03 10*3/uL (ref 0.00–0.07)
Basophils Absolute: 0.1 10*3/uL (ref 0.0–0.1)
Basophils Relative: 1 %
Eosinophils Absolute: 0.2 10*3/uL (ref 0.0–0.5)
Eosinophils Relative: 2 %
HCT: 36.9 % (ref 36.0–46.0)
Hemoglobin: 12.8 g/dL (ref 12.0–15.0)
Immature Granulocytes: 0 %
Lymphocytes Relative: 25 %
Lymphs Abs: 2 10*3/uL (ref 0.7–4.0)
MCH: 29.8 pg (ref 26.0–34.0)
MCHC: 34.7 g/dL (ref 30.0–36.0)
MCV: 86 fL (ref 80.0–100.0)
Monocytes Absolute: 0.6 10*3/uL (ref 0.1–1.0)
Monocytes Relative: 8 %
Neutro Abs: 5 10*3/uL (ref 1.7–7.7)
Neutrophils Relative %: 64 %
Platelets: 257 10*3/uL (ref 150–400)
RBC: 4.29 MIL/uL (ref 3.87–5.11)
RDW: 13.4 % (ref 11.5–15.5)
WBC: 7.9 10*3/uL (ref 4.0–10.5)
nRBC: 0 % (ref 0.0–0.2)

## 2020-08-07 NOTE — Assessment & Plan Note (Addendum)
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  Currently on surveillance. CT scan August 2021-bilateral parahilar right more than left scarring suggestive of radiation changes; no evidence of any metastatic disease noted.  Will order chest x-ray in 6 months.  See below  # Cough-less likely due to malignancy.  Question allergy/acid reflux.  Recommend compliance with medications-antihistamine/PPI..   # Mild  Anemia- improved- Hb 12.5; continue PO iron every other day.   # FBG- 132; recommend dietary discretion no diagnosed with diabetes.  Continue follow-up with PCP.  # DISPOSITION:  # Follow up in 6 months- MD; labs-cbc/cmp; T chest prior- -Dr.B   Cc; Dr.Scott

## 2020-08-07 NOTE — Patient Instructions (Signed)
CXR today.  

## 2020-08-07 NOTE — Progress Notes (Signed)
[place] OFFICE PROGRESS NOTE  Patient Care Team: [name], MD as PCP - General (Internal Medicine)  Cancer Staging No matching staging information was found for the patient.   Oncology History Overview Note  # 2015-patient is an 87 year old female with 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  Starting radiation and chemotherapy from April 14, 2014 Patient was started on carboplatinum andTaxol Herve were developed an allergic reaction to Taxol so would be changed to Abraxane 3.patient has finished 6 cycles of weekly chemotherapy with carboplatinum  and radiation therapy(May 28, 2014) 4.started on  NIVOLULAMAB because of persistent disease July 02, 2014. 5.  NIVOLULAMAB was discontinued because of persistent diarrhea in July of 2016.  August of 2016 CT scan was stable so no further chemotherapy  # AUG 25th PET- STABLE RUL MASS [radiation fibrosis; <1cm ? Mediastinal recurrence];   # AAA/ 3.3x 3.9 Stable [Dr.Schnier] -----------------------------------------------------   DIAGNOSIS: Lung cancer  STAGE:  IV-NED       ;GOALS: control  CURRENT/MOST RECENT THERAPY : surveillance    History of lung cancer  Malignant neoplasm of right upper lobe of lung ([place])  08/07/2019 Initial Diagnosis   Malignant neoplasm of right upper lobe of lung ([place])      INTERVAL HISTORY:  Heather Cooper 87 y.o.  female pleasant patient above history of Right upper lobe adenoca cell Stage IV lung cancer [contralateral lower lobe lung nodule]- currently on surveillance is here for follow-up.  Mild case of COVID in dec 2021.   Patient continues to complain of mild cough more at  nighttime.  Patient not very compliant with her cetirizine/PPI.  No weight loss.  No headaches.  Review of Systems  Constitutional: Negative for chills, diaphoresis, fever, malaise/fatigue and weight loss.  HENT: Negative for nosebleeds and sore throat.   Eyes: Negative for double vision.  Respiratory: Positive for cough. Negative for hemoptysis, sputum production, shortness of breath and wheezing.   Cardiovascular: Negative for chest pain, palpitations, orthopnea and leg swelling.  Gastrointestinal: Negative for abdominal pain, blood in stool, constipation, diarrhea, heartburn, melena, nausea and vomiting.  Genitourinary: Negative for dysuria, frequency and urgency.  Musculoskeletal: Negative for back pain and joint pain.  Skin: Negative.  Negative for itching and rash.  Neurological: Negative for dizziness, tingling, focal weakness, weakness and headaches.  Endo/Heme/Allergies: Does not bruise/bleed easily.  Psychiatric/Behavioral: Negative for depression. The patient is not nervous/anxious and does not have insomnia.      PAST MEDICAL HISTORY :  Past Medical History:  Diagnosis Date  . Chicken pox   . Hypercalcemia    familial hypocalciuric hypercalcemia  . Hypercholesterolemia   . Hypertension   . Lung cancer ([place])   . Osteoporosis   . Thyroid disease     PAST SURGICAL HISTORY :   Past Surgical History:  Procedure Laterality Date  . ABDOMINAL HYSTERECTOMY     partial  . CHOLECYSTECTOMY    . COLONOSCOPY WITH PROPOFOL N/A 04/17/2017   Procedure: COLONOSCOPY WITH PROPOFOL;  Surgeon: Manya Silvas, MD;  Location: [facility] ENDOSCOPY;  Service: Endoscopy;  Laterality: N/A;  . transvaginal hysterectomy  04/18/05   with anterior colporrhaphy    FAMILY HISTORY :  Family History  Problem Relation Age of Onset  . Stroke Mother   . Hypertension Mother   . Prostate cancer Father   . Cancer Father        prostate  . Heart disease Brother        s/p CABG  . Colon cancer Neg  Hx     SOCIAL HISTORY:   Social History   Tobacco Use  . Smoking status: Former Smoker    Quit date: 06/13/1997    Years since quitting: 23.1  . Smokeless tobacco: Never Used  Vaping Use  . Vaping Use: Never used  Substance Use Topics  . Alcohol use: No    Alcohol/week: 0.0 standard drinks  . Drug use: No    ALLERGIES:  is allergic to paclitaxel.  MEDICATIONS:  Current Outpatient Medications  Medication Sig Dispense Refill  . Ascorbic Acid (VITAMIN C) 1000 MG tablet Take 1,000 mg by mouth daily.    . cholecalciferol (VITAMIN D3) 25 MCG (1000 UNIT) tablet Take 1,000 Units by mouth daily.    . ferrous sulfate 325 (65 FE) MG tablet Take 325 mg by mouth daily with breakfast.    . fluticasone (FLONASE) 50 MCG/ACT nasal spray Place 2 sprays into both nostrils daily. 16 g 3  . levocetirizine (XYZAL) 5 MG tablet Take 1 tablet (5 mg total) by mouth every evening. 90 tablet 2  . olmesartan-hydrochlorothiazide (BENICAR HCT) 20-12.5 MG tablet TAKE 1 TABLET BY MOUTH ONCE DAILY 90 tablet 1  . pantoprazole (PROTONIX) 40 MG tablet TAKE 1 TABLET BY MOUTH ONCE DAILY 90 tablet 3   No current facility-administered medications for this visit.   Facility-Administered Medications Ordered in Other Visits  Medication Dose Route Frequency Provider Last Rate Last Admin  . sodium chloride 0.9 % 1,000 mL with potassium chloride 20 mEq, magnesium sulfate 2 g infusion   Intravenous Continuous Forest Gleason, MD   Stopped at 12/04/14 1300    PHYSICAL EXAMINATION: ECOG PERFORMANCE STATUS: 0 - Asymptomatic  BP 137/65   Pulse 85   Temp (!) 96.8 F (36 C) (Tympanic)   Resp 20   Ht 5' 3"  (1.6 m)   Wt 152 lb 12.8 oz (69.3 kg)   BMI 27.07 kg/m   Filed Weights   08/07/20 1009  Weight: 152 lb 12.8 oz (69.3 kg)    Physical Exam Constitutional:      Comments: Accompanied her daughter.  Walking herself  HENT:     Head: Normocephalic and atraumatic.     Mouth/Throat:     Pharynx: No oropharyngeal  exudate.  Eyes:     Pupils: Pupils are equal, round, and reactive to light.  Cardiovascular:     Rate and Rhythm: Normal rate and regular rhythm.  Pulmonary:     Effort: Pulmonary effort is normal. No respiratory distress.     Breath sounds: Normal breath sounds. No wheezing.  Abdominal:     General: Bowel sounds are normal. There is no distension.     Palpations: Abdomen is soft. There is no mass.     Tenderness: There is no abdominal tenderness. There is no guarding or rebound.  Musculoskeletal:        General: No tenderness. Normal range of motion.     Cervical back: Normal range of motion and neck supple.  Skin:    General: Skin is warm.  Neurological:     Mental Status: She is alert and oriented to person, place, and time.  Psychiatric:  Mood and Affect: Affect normal.      LABORATORY DATA:  I have reviewed the data as listed    Component Value Date/Time   NA 137 08/07/2020 0946   NA 130 (L) 10/09/2014 0846   K 3.8 08/07/2020 0946   K 4.1 10/09/2014 0846   CL 100 08/07/2020 0946   CL 98 (L) 10/09/2014 0846   CO2 24 08/07/2020 0946   CO2 25 10/09/2014 0846   GLUCOSE 132 (H) 08/07/2020 0946   GLUCOSE 161 (H) 10/09/2014 0846   BUN 24 (H) 08/07/2020 0946   BUN 14 10/09/2014 0846   CREATININE 1.06 (H) 08/07/2020 0946   CREATININE 0.70 10/09/2014 0846   CALCIUM 9.6 08/07/2020 0946   CALCIUM 9.1 10/09/2014 0846   PROT 8.1 08/07/2020 0946   PROT 7.3 10/09/2014 0846   ALBUMIN 4.0 08/07/2020 0946   ALBUMIN 3.6 10/09/2014 0846   AST 20 08/07/2020 0946   AST 16 10/09/2014 0846   ALT 16 08/07/2020 0946   ALT 13 (L) 10/09/2014 0846   ALKPHOS 67 08/07/2020 0946   ALKPHOS 70 10/09/2014 0846   BILITOT 0.7 08/07/2020 0946   BILITOT 1.0 10/09/2014 0846   GFRNONAA 51 (L) 08/07/2020 0946   GFRNONAA >60 10/09/2014 0846   GFRAA >60 02/05/2020 1004   GFRAA >60 10/09/2014 0846    No results found for: SPEP, UPEP  Lab Results  Component Value Date   WBC 7.9  08/07/2020   NEUTROABS 5.0 08/07/2020   HGB 12.8 08/07/2020   HCT 36.9 08/07/2020   MCV 86.0 08/07/2020   PLT 257 08/07/2020      Chemistry      Component Value Date/Time   NA 137 08/07/2020 0946   NA 130 (L) 10/09/2014 0846   K 3.8 08/07/2020 0946   K 4.1 10/09/2014 0846   CL 100 08/07/2020 0946   CL 98 (L) 10/09/2014 0846   CO2 24 08/07/2020 0946   CO2 25 10/09/2014 0846   BUN 24 (H) 08/07/2020 0946   BUN 14 10/09/2014 0846   CREATININE 1.06 (H) 08/07/2020 0946   CREATININE 0.70 10/09/2014 0846   GLU 111 03/18/2014 1048      Component Value Date/Time   CALCIUM 9.6 08/07/2020 0946   CALCIUM 9.1 10/09/2014 0846   ALKPHOS 67 08/07/2020 0946   ALKPHOS 70 10/09/2014 0846   AST 20 08/07/2020 0946   AST 16 10/09/2014 0846   ALT 16 08/07/2020 0946   ALT 13 (L) 10/09/2014 0846   BILITOT 0.7 08/07/2020 0946   BILITOT 1.0 10/09/2014 0846       RADIOGRAPHIC STUDIES: I have personally reviewed the radiological images as listed and agreed with the findings in the report. DG Chest 2 View  Result Date: 08/07/2020 CLINICAL DATA:  Chronic cough EXAM: CHEST - 2 VIEW COMPARISON:  February 24, 2014 chest radiograph; chest CT February 04, 2020 FINDINGS: Scarring with volume loss in the medial right upper lobe likely is due to a degree of radiation fibrosis. There is milder scarring in the medial left upper lobe, stable. No edema or airspace opacity. Heart size and pulmonary vascularity are normal. No adenopathy. There is aortic atherosclerosis. There are foci of degenerative change in the thoracic spine. IMPRESSION: Scarring in each medial upper lobe region, more severe on the right than the left. Suspect a degree of radiation fibrosis. No edema or airspace opacity. No evident mass. No adenopathy. Heart size normal. Aortic Atherosclerosis (ICD10-I70.0). Electronically Signed   By: Lowella Grip III M.D.  On: 08/07/2020 12:07     ASSESSMENT & PLAN:  Malignant neoplasm of right upper  lobe of lung ([place]) # Right upper lobe lung cancer adeno ca- Stage IV [contralateral left lower lobe lung nodule] status post chemoradiation-s/p Nivo [aslt 2015].  Currently on surveillance. CT scan August 2021-bilateral parahilar right more than left scarring suggestive of radiation changes; no evidence of any metastatic disease noted.  # Cough- ? Acid reflux.   # Mild  Anemia- improved- Hb 12.5; continue PO iron every other day.   # FBG- 132; recommend dietary discretion no diagnosed with diabetes. Consider checking hemoglobin A1c with PCP  # HTN-systolic [id number] checking blood pressure at home; keeping a log of blood pressures-in reaching out to PCP that consistently elevated.  # DISPOSITION:  # Follow up in 6 months- MD; labs-cbc/cmp; T chest prior- -Dr.B   Cc; Dr.Scott     Orders Placed This Encounter  Procedures  . DG Chest 2 View    Standing Status:   Future    Number of Occurrences:   1    Standing Expiration Date:   08/07/2021    Order Specific Question:   Reason for Exam (SYMPTOM  OR DIAGNOSIS REQUIRED)    Answer:   cough    Order Specific Question:   Preferred imaging location?    Answer:   [facility]  . CT Chest Wo Contrast    Standing Status:   Future    Standing Expiration Date:   08/07/2021    Order Specific Question:   Preferred imaging location?    Answer:   [facility]  . CBC with Differential    Standing Status:   Future    Standing Expiration Date:   08/07/2021  . Comprehensive metabolic panel    Standing Status:   Future    Standing Expiration Date:   08/07/2021   All questions were answered. The patient knows to call the clinic with any problems, questions or concerns.      Cammie Sickle, MD 08/07/2020 1:04 PM

## 2020-08-10 ENCOUNTER — Telehealth: Payer: Self-pay | Admitting: Internal Medicine

## 2020-08-10 ENCOUNTER — Encounter: Payer: Self-pay | Admitting: Internal Medicine

## 2020-08-10 NOTE — Telephone Encounter (Signed)
On 2/25-spoke to patient regarding the results of the chest x-ray shows chronic scarring.  No acute process to explain her ongoing cough.  Suspect allergies/recommend compliance with her medications.  She agrees.

## 2020-09-07 ENCOUNTER — Other Ambulatory Visit: Payer: Self-pay

## 2020-09-09 ENCOUNTER — Other Ambulatory Visit: Payer: Self-pay

## 2020-09-09 ENCOUNTER — Ambulatory Visit (INDEPENDENT_AMBULATORY_CARE_PROVIDER_SITE_OTHER): Payer: Medicare PPO | Admitting: Internal Medicine

## 2020-09-09 VITALS — BP 126/64 | HR 86 | Temp 96.1°F | Resp 16 | Ht 63.0 in | Wt 151.4 lb

## 2020-09-09 DIAGNOSIS — I714 Abdominal aortic aneurysm, without rupture, unspecified: Secondary | ICD-10-CM

## 2020-09-09 DIAGNOSIS — R739 Hyperglycemia, unspecified: Secondary | ICD-10-CM

## 2020-09-09 DIAGNOSIS — E7800 Pure hypercholesterolemia, unspecified: Secondary | ICD-10-CM

## 2020-09-09 DIAGNOSIS — Z85118 Personal history of other malignant neoplasm of bronchus and lung: Secondary | ICD-10-CM

## 2020-09-09 DIAGNOSIS — K219 Gastro-esophageal reflux disease without esophagitis: Secondary | ICD-10-CM

## 2020-09-09 DIAGNOSIS — I10 Essential (primary) hypertension: Secondary | ICD-10-CM | POA: Diagnosis not present

## 2020-09-09 LAB — LIPID PANEL
Cholesterol: 201 mg/dL — ABNORMAL HIGH (ref 0–200)
HDL: 45.2 mg/dL (ref >39.00)
LDL Cholesterol: 130 mg/dL — ABNORMAL HIGH (ref 0–99)
NonHDL: 156.22
Total CHOL/HDL Ratio: 4
Triglycerides: 131 mg/dL (ref 0.0–149.0)
VLDL: 26.2 mg/dL (ref 0.0–40.0)

## 2020-09-09 LAB — BASIC METABOLIC PANEL
BUN: 21 mg/dL (ref 6–23)
CO2: 28 mEq/L (ref 19–32)
Calcium: 10.6 mg/dL — ABNORMAL HIGH (ref 8.4–10.5)
Chloride: 101 mEq/L (ref 96–112)
Creatinine, Ser: 0.97 mg/dL (ref 0.40–1.20)
GFR: 52.47 mL/min — ABNORMAL LOW (ref >60.00)
Glucose, Bld: 105 mg/dL — ABNORMAL HIGH (ref 70–99)
Potassium: 4.6 mEq/L (ref 3.5–5.1)
Sodium: 139 mEq/L (ref 135–145)

## 2020-09-09 LAB — HEMOGLOBIN A1C: Hgb A1c MFr Bld: 6.6 % — ABNORMAL HIGH (ref 4.6–6.5)

## 2020-09-09 LAB — TSH: TSH: 0.69 u[IU]/mL (ref 0.35–4.50)

## 2020-09-09 NOTE — Progress Notes (Signed)
Patient ID: Heather Cooper, female   DOB: 01/30/1931, 87 y.o.   MRN: [id number]   Subjective:    Patient ID: Heather Cooper, female    DOB: 06/30/1930, 87 y.o.   MRN: [id number]  HPI This visit occurred during the SARS-CoV-2 public health emergency.  Safety protocols were in place, including screening questions prior to the visit, additional usage of staff PPE, and extensive cleaning of exam room while observing appropriate contact time as indicated for disinfecting solutions.  Patient here for a scheduled follow up.  Has a history of right upper lobe adenocarcinoma stage IV lung cancer.  S/p chemoradiation Followed by oncology.  Currently on surveillance.  Breathing stable.  No chest pain no acid reflux reported.  No abdominal pain.  Bowels moving.  Also seeing vascular surgery - f/u abdominal aortic aneurysm.  Recent ultrasound - stable.  Recommended f/u in 12 months.  Blood pressure doing well.  Overall she feels she is doing well.    Past Medical History:  Diagnosis Date  . Chicken pox   . Hypercalcemia    familial hypocalciuric hypercalcemia  . Hypercholesterolemia   . Hypertension   . Lung cancer ([place])   . Osteoporosis   . Thyroid disease    Past Surgical History:  Procedure Laterality Date  . ABDOMINAL HYSTERECTOMY     partial  . CHOLECYSTECTOMY    . COLONOSCOPY WITH PROPOFOL N/A 04/17/2017   Procedure: COLONOSCOPY WITH PROPOFOL;  Surgeon: Manya Silvas, MD;  Location: [facility] ENDOSCOPY;  Service: Endoscopy;  Laterality: N/A;  . transvaginal hysterectomy  04/18/05   with anterior colporrhaphy   Family History  Problem Relation Age of Onset  . Stroke Mother   . Hypertension Mother   . Prostate cancer Father   . Cancer Father        prostate  . Heart disease Brother        s/p CABG  . Colon cancer Neg Hx    Social History   Socioeconomic History  . Marital status: Widowed    Spouse name: Not on file  . Number of children: 3  . Years of education: Not on file   . Highest education level: Not on file  Occupational History  . Not on file  Tobacco Use  . Smoking status: Former Smoker    Quit date: 06/13/1997    Years since quitting: 23.2  . Smokeless tobacco: Never Used  Vaping Use  . Vaping Use: Never used  Substance and Sexual Activity  . Alcohol use: No    Alcohol/week: 0.0 standard drinks  . Drug use: No  . Sexual activity: Not on file  Other Topics Concern  . Not on file  Social History Narrative   Lost her husband November 2019   Social Determinants of Health   Financial Resource Strain: Low Risk   . Difficulty of Paying Living Expenses: Not very hard  Food Insecurity: No Food Insecurity  . Worried About Charity fundraiser in the Last Year: Never true  . Ran Out of Food in the Last Year: Never true  Transportation Needs: No Transportation Needs  . Lack of Transportation (Medical): No  . Lack of Transportation (Non-Medical): No  Physical Activity: Not on file  Stress: No Stress Concern Present  . Feeling of Stress : Not at all  Social Connections: Moderately Integrated  . Frequency of Communication with Friends and Family: More than three times a week  . Frequency of Social Gatherings with Friends and Family:  More than three times a week  . Attends Religious Services: More than 4 times per year  . Active Member of Clubs or Organizations: Yes  . Attends Archivist Meetings: More than 4 times per year  . Marital Status: Widowed    Outpatient Encounter Medications as of 09/09/2020  Medication Sig  . Ascorbic Acid (VITAMIN C) 1000 MG tablet Take 1,000 mg by mouth daily.  . cholecalciferol (VITAMIN D3) 25 MCG (1000 UNIT) tablet Take 1,000 Units by mouth daily.  . ferrous sulfate 325 (65 FE) MG tablet Take 325 mg by mouth daily with breakfast.  . fluticasone (FLONASE) 50 MCG/ACT nasal spray Place 2 sprays into both nostrils daily.  [name] levocetirizine (XYZAL) 5 MG tablet Take 1 tablet (5 mg total) by mouth every evening.   . olmesartan-hydrochlorothiazide (BENICAR HCT) 20-12.5 MG tablet TAKE 1 TABLET BY MOUTH ONCE DAILY  . pantoprazole (PROTONIX) 40 MG tablet TAKE 1 TABLET BY MOUTH ONCE DAILY   Facility-Administered Encounter Medications as of 09/09/2020  Medication  . sodium chloride 0.9 % 1,000 mL with potassium chloride 20 mEq, magnesium sulfate 2 g infusion    Review of Systems  Constitutional: Negative for appetite change and unexpected weight change.  HENT: Negative for congestion and sinus pressure.   Respiratory: Negative for cough, chest tightness and shortness of breath.   Cardiovascular: Negative for chest pain, palpitations and leg swelling.  Gastrointestinal: Negative for abdominal pain, diarrhea, nausea and vomiting.  Genitourinary: Negative for difficulty urinating and dysuria.  Musculoskeletal: Negative for joint swelling and myalgias.  Skin: Negative for color change and rash.  Neurological: Negative for dizziness, light-headedness and headaches.  Psychiatric/Behavioral: Negative for agitation and dysphoric mood.       Objective:    Physical Exam Vitals reviewed.  Constitutional:      General: She is not in acute distress.    Appearance: Normal appearance.  HENT:     Head: Normocephalic and atraumatic.     Right Ear: External ear normal.     Left Ear: External ear normal.  Eyes:     General: No scleral icterus.       Right eye: No discharge.        Left eye: No discharge.     Conjunctiva/sclera: Conjunctivae normal.  Neck:     Thyroid: No thyromegaly.  Cardiovascular:     Rate and Rhythm: Normal rate and regular rhythm.  Pulmonary:     Effort: No respiratory distress.     Breath sounds: Normal breath sounds. No wheezing.  Abdominal:     General: Bowel sounds are normal.     Palpations: Abdomen is soft.     Tenderness: There is no abdominal tenderness.  Musculoskeletal:        General: No swelling or tenderness.     Cervical back: Neck supple. No tenderness.   Lymphadenopathy:     Cervical: No cervical adenopathy.  Skin:    Findings: No erythema or rash.  Neurological:     Mental Status: She is alert.  Psychiatric:        Mood and Affect: Mood normal.        Behavior: Behavior normal.     BP 126/64   Pulse 86   Temp (!) 96.1 F (35.6 C) (Oral)   Resp 16   Ht 5' 3" (1.6 m)   Wt 151 lb 6.4 oz (68.7 kg)   SpO2 99%   BMI 26.82 kg/m  Wt Readings from Last 3 Encounters:  09/09/20 151 lb 6.4 oz (68.7 kg)  08/07/20 152 lb 12.8 oz (69.3 kg)  08/06/20 151 lb (68.5 kg)     Lab Results  Component Value Date   WBC 7.9 08/07/2020   HGB 12.8 08/07/2020   HCT 36.9 08/07/2020   PLT 257 08/07/2020   GLUCOSE 105 (H) 09/09/2020   CHOL 201 (H) 09/09/2020   TRIG 131.0 09/09/2020   HDL 45.20 09/09/2020   LDLCALC 130 (H) 09/09/2020   ALT 16 08/07/2020   AST 20 08/07/2020   NA 139 09/09/2020   K 4.6 09/09/2020   CL 101 09/09/2020   CREATININE 0.97 09/09/2020   BUN 21 09/09/2020   CO2 28 09/09/2020   TSH 0.69 09/09/2020   INR 1.0 03/18/2014   INR 1.0 03/18/2014   HGBA1C 6.6 (H) 09/09/2020    DG Chest 2 View  Result Date: 08/07/2020 CLINICAL DATA:  Chronic cough EXAM: CHEST - 2 VIEW COMPARISON:  February 24, 2014 chest radiograph; chest CT February 04, 2020 FINDINGS: Scarring with volume loss in the medial right upper lobe likely is due to a degree of radiation fibrosis. There is milder scarring in the medial left upper lobe, stable. No edema or airspace opacity. Heart size and pulmonary vascularity are normal. No adenopathy. There is aortic atherosclerosis. There are foci of degenerative change in the thoracic spine. IMPRESSION: Scarring in each medial upper lobe region, more severe on the right than the left. Suspect a degree of radiation fibrosis. No edema or airspace opacity. No evident mass. No adenopathy. Heart size normal. Aortic Atherosclerosis (ICD10-I70.0). Electronically Signed   By: Lowella Grip III M.D.   On: 08/07/2020  12:07       Assessment & Plan:   Problem List Items Addressed This Visit    Abdominal aortic aneurysm ([place])    Followed by Dr Delana Meyer (08/06/20)  Stable.  Recommended f/u in 12 months.        GERD (gastroesophageal reflux disease)    Continue protonix.  No acid reflux reported.       History of lung cancer    Followed by Dr Rogue Bussing.  Stable.  Breathing stable.  Follow.       Hypercholesterolemia    Low cholesterol diet and exercise. Follow lipid panel.       Relevant Orders   Lipid panel (Completed)   TSH (Completed)   Basic metabolic panel (Completed)   Hyperglycemia - Primary    Low carb diet and exercise.  Follow met b and a1c.       Relevant Orders   Hemoglobin A1c (Completed)   Hypertension    Continue benicar/hctz.  Blood pressure doing well.  128/78 today.  Follow pressures.  Follow metabolic panel.           [name], MD

## 2020-09-15 ENCOUNTER — Other Ambulatory Visit: Payer: Self-pay | Admitting: Internal Medicine

## 2020-09-15 NOTE — Progress Notes (Signed)
Order placed for f/u lab.   

## 2020-09-19 ENCOUNTER — Encounter: Payer: Self-pay | Admitting: Internal Medicine

## 2020-09-19 NOTE — Assessment & Plan Note (Signed)
Low carb diet and exercise.  Follow met b and a1c.  

## 2020-09-19 NOTE — Assessment & Plan Note (Signed)
Continue benicar/hctz.  Blood pressure doing well.  128/78 today.  Follow pressures.  Follow metabolic panel.

## 2020-09-19 NOTE — Assessment & Plan Note (Signed)
Low cholesterol diet and exercise.  Follow lipid panel.   

## 2020-09-19 NOTE — Assessment & Plan Note (Signed)
Followed by Dr Rogue Bussing.  Stable.  Breathing stable.  Follow.

## 2020-09-19 NOTE — Assessment & Plan Note (Addendum)
Followed by Dr Delana Meyer (08/06/20)  Stable.  Recommended f/u in 12 months.

## 2020-09-19 NOTE — Assessment & Plan Note (Signed)
Continue protonix.  No acid reflux reported.

## 2020-10-10 ENCOUNTER — Other Ambulatory Visit: Payer: Self-pay | Admitting: Internal Medicine

## 2020-10-13 ENCOUNTER — Other Ambulatory Visit: Payer: Self-pay

## 2020-10-13 ENCOUNTER — Other Ambulatory Visit (INDEPENDENT_AMBULATORY_CARE_PROVIDER_SITE_OTHER): Payer: Medicare PPO

## 2020-10-13 DIAGNOSIS — C3411 Malignant neoplasm of upper lobe, right bronchus or lung: Secondary | ICD-10-CM

## 2020-10-13 LAB — CALCIUM: Calcium: 10 mg/dL (ref 8.4–10.5)

## 2021-01-08 ENCOUNTER — Ambulatory Visit: Payer: Medicare PPO

## 2021-01-19 ENCOUNTER — Ambulatory Visit (INDEPENDENT_AMBULATORY_CARE_PROVIDER_SITE_OTHER): Payer: Medicare PPO | Admitting: Internal Medicine

## 2021-01-19 ENCOUNTER — Other Ambulatory Visit: Payer: Self-pay

## 2021-01-19 ENCOUNTER — Encounter: Payer: Self-pay | Admitting: Internal Medicine

## 2021-01-19 VITALS — BP 122/66 | HR 86 | Temp 97.6°F | Resp 16 | Ht 63.0 in | Wt 153.4 lb

## 2021-01-19 DIAGNOSIS — I10 Essential (primary) hypertension: Secondary | ICD-10-CM

## 2021-01-19 DIAGNOSIS — M810 Age-related osteoporosis without current pathological fracture: Secondary | ICD-10-CM

## 2021-01-19 DIAGNOSIS — I714 Abdominal aortic aneurysm, without rupture, unspecified: Secondary | ICD-10-CM

## 2021-01-19 DIAGNOSIS — E7800 Pure hypercholesterolemia, unspecified: Secondary | ICD-10-CM | POA: Diagnosis not present

## 2021-01-19 DIAGNOSIS — Z85118 Personal history of other malignant neoplasm of bronchus and lung: Secondary | ICD-10-CM | POA: Diagnosis not present

## 2021-01-19 DIAGNOSIS — R1310 Dysphagia, unspecified: Secondary | ICD-10-CM | POA: Diagnosis not present

## 2021-01-19 DIAGNOSIS — E1165 Type 2 diabetes mellitus with hyperglycemia: Secondary | ICD-10-CM

## 2021-01-19 DIAGNOSIS — K219 Gastro-esophageal reflux disease without esophagitis: Secondary | ICD-10-CM | POA: Diagnosis not present

## 2021-01-19 LAB — BASIC METABOLIC PANEL
BUN: 17 mg/dL (ref 6–23)
CO2: 30 mEq/L (ref 19–32)
Calcium: 10.1 mg/dL (ref 8.4–10.5)
Chloride: 101 mEq/L (ref 96–112)
Creatinine, Ser: 0.99 mg/dL (ref 0.40–1.20)
GFR: 51.07 mL/min — ABNORMAL LOW (ref >60.00)
Glucose, Bld: 108 mg/dL — ABNORMAL HIGH (ref 70–99)
Potassium: 4.5 mEq/L (ref 3.5–5.1)
Sodium: 138 mEq/L (ref 135–145)

## 2021-01-19 LAB — HEPATIC FUNCTION PANEL
ALT: 10 U/L (ref 0–35)
AST: 15 U/L (ref 0–37)
Albumin: 4.2 g/dL (ref 3.5–5.2)
Alkaline Phosphatase: 71 U/L (ref 39–117)
Bilirubin, Direct: 0.1 mg/dL (ref 0.0–0.3)
Total Bilirubin: 0.6 mg/dL (ref 0.2–1.2)
Total Protein: 7.5 g/dL (ref 6.0–8.3)

## 2021-01-19 LAB — MICROALBUMIN / CREATININE URINE RATIO
Creatinine,U: 74.3 mg/dL
Microalb Creat Ratio: 7 mg/g (ref 0.0–30.0)
Microalb, Ur: 5.2 mg/dL — ABNORMAL HIGH (ref 0.0–1.9)

## 2021-01-19 LAB — LIPID PANEL
Cholesterol: 184 mg/dL (ref 0–200)
HDL: 41.5 mg/dL (ref >39.00)
LDL Cholesterol: 113 mg/dL — ABNORMAL HIGH (ref 0–99)
NonHDL: 142.3
Total CHOL/HDL Ratio: 4
Triglycerides: 149 mg/dL (ref 0.0–149.0)
VLDL: 29.8 mg/dL (ref 0.0–40.0)

## 2021-01-19 LAB — VITAMIN D 25 HYDROXY (VIT D DEFICIENCY, FRACTURES): VITD: 44.98 ng/mL (ref 30.00–100.00)

## 2021-01-19 LAB — HEMOGLOBIN A1C: Hgb A1c MFr Bld: 6.6 % — ABNORMAL HIGH (ref 4.6–6.5)

## 2021-01-19 NOTE — Progress Notes (Signed)
Patient ID: Heather Cooper, female   DOB: April 15, 1931, 87 y.o.   MRN: [id number]   Subjective:    Patient ID: Heather Cooper, female    DOB: November 22, 1930, 87 y.o.   MRN: [id number]  HPI This visit occurred during the SARS-CoV-2 public health emergency.  Safety protocols were in place, including screening questions prior to the visit, additional usage of staff PPE, and extensive cleaning of exam room while observing appropriate contact time as indicated for disinfecting solutions.   Patient here for a scheduled follow up.  Here to follow up regarding her blood pressure and cholesterol.  She reports she has been doing relatively well.  She does report noticing issues with swallowing.  Noticed over the last 3-4 months.  States feels like food is sticking in her throat.  Has been cutting her foot and taking small bites.  Also eating slowly and drinking while eating.  This has helped, but has noticed persistent issues.  Tries to stay active.  No chest pain.  Breathing stable.  No acid reflux.  No abdominal pain.  Bowels moving.  Blood pressures averaging 128-low 130s/64.  Discussed labs.    Past Medical History:  Diagnosis Date   Chicken pox    Hypercalcemia    familial hypocalciuric hypercalcemia   Hypercholesterolemia    Hypertension    Lung cancer ([place])    Osteoporosis    Thyroid disease    Past Surgical History:  Procedure Laterality Date   ABDOMINAL HYSTERECTOMY     partial   CHOLECYSTECTOMY     COLONOSCOPY WITH PROPOFOL N/A 04/17/2017   Procedure: COLONOSCOPY WITH PROPOFOL;  Surgeon: Manya Silvas, MD;  Location: [facility] ENDOSCOPY;  Service: Endoscopy;  Laterality: N/A;   transvaginal hysterectomy  04/18/05   with anterior colporrhaphy   Family History  Problem Relation Age of Onset   Stroke Mother    Hypertension Mother    Prostate cancer Father    Cancer Father        prostate   Heart disease Brother        s/p CABG   Colon cancer Neg Hx    Social History   Socioeconomic  History   Marital status: Widowed    Spouse name: Not on file   Number of children: 3   Years of education: Not on file   Highest education level: Not on file  Occupational History   Not on file  Tobacco Use   Smoking status: Former    Types: Cigarettes    Quit date: 06/13/1997    Years since quitting: 23.6   Smokeless tobacco: Never  Vaping Use   Vaping Use: Never used  Substance and Sexual Activity   Alcohol use: No    Alcohol/week: 0.0 standard drinks   Drug use: No   Sexual activity: Not on file  Other Topics Concern   Not on file  Social History Narrative   Lost her husband November 2019   Social Determinants of Health   Financial Resource Strain: Not on file  Food Insecurity: Not on file  Transportation Needs: Not on file  Physical Activity: Not on file  Stress: Not on file  Social Connections: Not on file    Review of Systems  Constitutional:  Negative for fatigue and unexpected weight change.  HENT:  Negative for congestion and sinus pressure.   Respiratory:  Negative for cough, chest tightness and shortness of breath.   Cardiovascular:  Negative for chest pain, palpitations and leg swelling.  Gastrointestinal:  Negative for abdominal pain, diarrhea, nausea and vomiting.       Swallowing issues as outlined.  Feels like food sticking.   Genitourinary:  Negative for difficulty urinating and dysuria.  Musculoskeletal:  Negative for joint swelling and myalgias.  Skin:  Negative for color change and rash.  Neurological:  Negative for dizziness, light-headedness and headaches.  Psychiatric/Behavioral:  Negative for agitation and dysphoric mood.       Objective:    Physical Exam Vitals reviewed.  Constitutional:      General: She is not in acute distress.    Appearance: Normal appearance.  HENT:     Head: Normocephalic and atraumatic.     Right Ear: External ear normal.     Left Ear: External ear normal.  Eyes:     General: No scleral icterus.       Right  eye: No discharge.        Left eye: No discharge.     Conjunctiva/sclera: Conjunctivae normal.  Neck:     Thyroid: No thyromegaly.  Cardiovascular:     Rate and Rhythm: Normal rate and regular rhythm.  Pulmonary:     Effort: No respiratory distress.     Breath sounds: Normal breath sounds. No wheezing.  Abdominal:     General: Bowel sounds are normal.     Palpations: Abdomen is soft.     Tenderness: There is no abdominal tenderness.  Musculoskeletal:        General: No swelling or tenderness.     Cervical back: Neck supple. No tenderness.  Lymphadenopathy:     Cervical: No cervical adenopathy.  Skin:    Findings: No erythema or rash.  Neurological:     Mental Status: She is alert.  Psychiatric:        Mood and Affect: Mood normal.        Behavior: Behavior normal.    BP 122/66   Pulse 86   Temp 97.6 F (36.4 C)   Resp 16   Ht 5\' 3"  (1.6 m)   Wt 153 lb 6.4 oz (69.6 kg)   SpO2 98%   BMI 27.17 kg/m  Wt Readings from Last 3 Encounters:  01/19/21 153 lb 6.4 oz (69.6 kg)  09/09/20 151 lb 6.4 oz (68.7 kg)  08/07/20 152 lb 12.8 oz (69.3 kg)    Outpatient Encounter Medications as of 01/19/2021  Medication Sig   Ascorbic Acid (VITAMIN C) 1000 MG tablet Take 1,000 mg by mouth daily.   cholecalciferol (VITAMIN D3) 25 MCG (1000 UNIT) tablet Take 1,000 Units by mouth daily.   ferrous sulfate 325 (65 FE) MG tablet Take 325 mg by mouth daily with breakfast.   fluticasone (FLONASE) 50 MCG/ACT nasal spray Place 2 sprays into both nostrils daily.   levocetirizine (XYZAL) 5 MG tablet Take 1 tablet (5 mg total) by mouth every evening.   olmesartan-hydrochlorothiazide (BENICAR HCT) 20-12.5 MG tablet TAKE 1 TABLET BY MOUTH ONCE DAILY   pantoprazole (PROTONIX) 40 MG tablet TAKE 1 TABLET BY MOUTH ONCE DAILY   Facility-Administered Encounter Medications as of 01/19/2021  Medication   sodium chloride 0.9 % 1,000 mL with potassium chloride 20 mEq, magnesium sulfate 2 g infusion     Lab  Results  Component Value Date   WBC 7.9 08/07/2020   HGB 12.8 08/07/2020   HCT 36.9 08/07/2020   PLT 257 08/07/2020   GLUCOSE 108 (H) 01/19/2021   CHOL 184 01/19/2021   TRIG 149.0 01/19/2021   HDL 41.50  01/19/2021   LDLCALC 113 (H) 01/19/2021   ALT 10 01/19/2021   AST 15 01/19/2021   NA 138 01/19/2021   K 4.5 01/19/2021   CL 101 01/19/2021   CREATININE 0.99 01/19/2021   BUN 17 01/19/2021   CO2 30 01/19/2021   TSH 0.69 09/09/2020   INR 1.0 03/18/2014   INR 1.0 03/18/2014   HGBA1C 6.6 (H) 01/19/2021   MICROALBUR 5.2 (H) 01/19/2021    DG Chest 2 View  Result Date: 08/07/2020 CLINICAL DATA:  Chronic cough EXAM: CHEST - 2 VIEW COMPARISON:  February 24, 2014 chest radiograph; chest CT February 04, 2020 FINDINGS: Scarring with volume loss in the medial right upper lobe likely is due to a degree of radiation fibrosis. There is milder scarring in the medial left upper lobe, stable. No edema or airspace opacity. Heart size and pulmonary vascularity are normal. No adenopathy. There is aortic atherosclerosis. There are foci of degenerative change in the thoracic spine. IMPRESSION: Scarring in each medial upper lobe region, more severe on the right than the left. Suspect a degree of radiation fibrosis. No edema or airspace opacity. No evident mass. No adenopathy. Heart size normal. Aortic Atherosclerosis (ICD10-I70.0). Electronically Signed   By: Lowella Grip III M.D.   On: 08/07/2020 12:07       Assessment & Plan:   Problem List Items Addressed This Visit     Abdominal aortic aneurysm ([place])    Followed by Dr Delana Meyer (08/06/20)  Stable.  Recommended f/u in 12 months.        Dysphagia    Reports problems with swallowing and feeling like food is sticking.  Discussed continuing to eat slowly, take small bites and chew food well.  Given persistence, we discussed further w/up and evaluation.  Discussed swallowing study and/or referral to GI. She prefers, scheduling barium swallow with UGI.         GERD (gastroesophageal reflux disease)    Continues on protonix.  No acid reflux reported.       History of lung cancer    Followed by Dr Rogue Bussing.  Stable.  Last evaluated 07/2020.  Recommended 6 month f/u.        Hypercalcemia    Calcium just checked and wnl.       Hypercholesterolemia    Low cholesterol diet and exercise. Follow lipid panel.       Relevant Orders   Lipid panel (Completed)   Hepatic function panel (Completed)   Hypertension - Primary    Continue benicar/hctz.  Blood pressure doing well.  128/78 today.  Follow pressures.  Follow metabolic panel.       Osteoporosis   Relevant Orders   VITAMIN D 25 Hydroxy (Vit-D Deficiency, Fractures) (Completed)   Type 2 diabetes mellitus with hyperglycemia (HCC)    A1c just checked 6.6.  Given elevated sugar, recommend continuing low carb diet and exercise.  Hold on medication.  Follow.        Relevant Orders   Hemoglobin A1c (Completed)   Basic metabolic panel (Completed)   Microalbumin / creatinine urine ratio (Completed)     [name], MD

## 2021-01-21 ENCOUNTER — Telehealth: Payer: Self-pay | Admitting: Internal Medicine

## 2021-01-21 DIAGNOSIS — R1319 Other dysphagia: Secondary | ICD-10-CM

## 2021-01-21 NOTE — Telephone Encounter (Signed)
Patient was in to see Dr.Scott on Tuesday and is requesting to schedule a swallow test that she discussed at her last visit.Please advise.

## 2021-01-21 NOTE — Telephone Encounter (Signed)
Advised that we are working on this and once it has been processed someone will be contacting her with appt for swallow study

## 2021-01-22 DIAGNOSIS — R1310 Dysphagia, unspecified: Secondary | ICD-10-CM | POA: Insufficient documentation

## 2021-01-22 NOTE — Assessment & Plan Note (Signed)
Continue benicar/hctz.  Blood pressure doing well.  128/78 today.  Follow pressures.  Follow metabolic panel.

## 2021-01-22 NOTE — Assessment & Plan Note (Signed)
Low cholesterol diet and exercise.  Follow lipid panel.   

## 2021-01-22 NOTE — Assessment & Plan Note (Signed)
A1c just checked 6.6.  Given elevated sugar, recommend continuing low carb diet and exercise.  Hold on medication.  Follow.

## 2021-01-22 NOTE — Assessment & Plan Note (Signed)
Followed by Dr Rogue Bussing.  Stable.  Last evaluated 07/2020.  Recommended 6 month f/u.

## 2021-01-22 NOTE — Assessment & Plan Note (Signed)
Continues on protonix.  No acid reflux reported.

## 2021-01-22 NOTE — Assessment & Plan Note (Signed)
Calcium just checked and wnl.

## 2021-01-22 NOTE — Assessment & Plan Note (Signed)
Followed by Dr Delana Meyer (08/06/20)  Stable.  Recommended f/u in 12 months.

## 2021-01-22 NOTE — Assessment & Plan Note (Signed)
Reports problems with swallowing and feeling like food is sticking.  Discussed continuing to eat slowly, take small bites and chew food well.  Given persistence, we discussed further w/up and evaluation.  Discussed swallowing study and/or referral to GI. She prefers, scheduling barium swallow with UGI.

## 2021-01-25 ENCOUNTER — Ambulatory Visit (INDEPENDENT_AMBULATORY_CARE_PROVIDER_SITE_OTHER): Payer: Medicare PPO

## 2021-01-25 VITALS — Ht 63.0 in | Wt 153.0 lb

## 2021-01-25 DIAGNOSIS — Z0000 Encounter for general adult medical examination without abnormal findings: Secondary | ICD-10-CM | POA: Diagnosis not present

## 2021-01-25 NOTE — Progress Notes (Signed)
Subjective:   AUBERY DATE is a 87 y.o. female who presents for Medicare Annual (Subsequent) preventive examination.  Review of Systems    No ROS.  Medicare Wellness Virtual Visit.  Visual/audio telehealth visit, UTA vital signs.   See social history for additional risk factors.   Cardiac Risk Factors include: advanced age (>51men, >87 women);diabetes mellitus     Objective:    Today's Vitals   01/25/21 1034  Weight: 153 lb (69.4 kg)  Height: 5\' 3"  (1.6 m)   Body mass index is 27.1 kg/m.  Advanced Directives 01/25/2021 08/06/2020 01/08/2020 02/04/2019 01/07/2019 07/05/2018 01/03/2018  Does Patient Have a Medical Advance Directive? Yes No Yes No Yes Yes Yes  Type of Paramedic of [place];Living will - [place];Living will - [place];Living will [place];Living will Living will  Does patient want to make changes to medical advance directive? No - Patient declined - No - Patient declined - No - Patient declined - No - Patient declined  Copy of [place] in Chart? No - copy requested - No - copy requested - No - copy requested No - copy requested No - copy requested  Would patient like information on creating a medical advance directive? - No - Patient declined - No - Patient declined - No - Patient declined -    Current Medications (verified) Outpatient Encounter Medications as of 01/25/2021  Medication Sig   Ascorbic Acid (VITAMIN C) 1000 MG tablet Take 1,000 mg by mouth daily.   cholecalciferol (VITAMIN D3) 25 MCG (1000 UNIT) tablet Take 1,000 Units by mouth daily.   ferrous sulfate 325 (65 FE) MG tablet Take 325 mg by mouth daily with breakfast.   fluticasone (FLONASE) 50 MCG/ACT nasal spray Place 2 sprays into both nostrils daily.   levocetirizine (XYZAL) 5 MG tablet Take 1 tablet (5 mg total) by mouth every evening.   olmesartan-hydrochlorothiazide (BENICAR HCT) 20-12.5 MG  tablet TAKE 1 TABLET BY MOUTH ONCE DAILY   pantoprazole (PROTONIX) 40 MG tablet TAKE 1 TABLET BY MOUTH ONCE DAILY   Facility-Administered Encounter Medications as of 01/25/2021  Medication   sodium chloride 0.9 % 1,000 mL with potassium chloride 20 mEq, magnesium sulfate 2 g infusion    Allergies (verified) Paclitaxel   History: Past Medical History:  Diagnosis Date   Chicken pox    Hypercalcemia    familial hypocalciuric hypercalcemia   Hypercholesterolemia    Hypertension    Lung cancer ([place])    Osteoporosis    Thyroid disease    Past Surgical History:  Procedure Laterality Date   ABDOMINAL HYSTERECTOMY     partial   CHOLECYSTECTOMY     COLONOSCOPY WITH PROPOFOL N/A 04/17/2017   Procedure: COLONOSCOPY WITH PROPOFOL;  Surgeon: Manya Silvas, MD;  Location: [facility] ENDOSCOPY;  Service: Endoscopy;  Laterality: N/A;   transvaginal hysterectomy  04/18/05   with anterior colporrhaphy   Family History  Problem Relation Age of Onset   Stroke Mother    Hypertension Mother    Prostate cancer Father    Cancer Father        prostate   Heart disease Brother        s/p CABG   Colon cancer Neg Hx    Social History   Socioeconomic History   Marital status: Widowed    Spouse name: Not on file   Number of children: 3   Years of education: Not on file  Highest education level: Not on file  Occupational History   Not on file  Tobacco Use   Smoking status: Former    Types: Cigarettes    Quit date: 06/13/1997    Years since quitting: 23.6   Smokeless tobacco: Never  Vaping Use   Vaping Use: Never used  Substance and Sexual Activity   Alcohol use: No    Alcohol/week: 0.0 standard drinks   Drug use: No   Sexual activity: Not on file  Other Topics Concern   Not on file  Social History Narrative   Lost her husband November 2019   Social Determinants of Health   Financial Resource Strain: Low Risk    Difficulty of Paying Living Expenses: Not hard at all  Food  Insecurity: No Food Insecurity   Worried About Charity fundraiser in the Last Year: Never true   Arboriculturist in the Last Year: Never true  Transportation Needs: No Transportation Needs   Lack of Transportation (Medical): No   Lack of Transportation (Non-Medical): No  Physical Activity: Unknown   Days of Exercise per Week: 0 days   Minutes of Exercise per Session: Not on file  Stress: No Stress Concern Present   Feeling of Stress : Not at all  Social Connections: Moderately Integrated   Frequency of Communication with Friends and Family: More than three times a week   Frequency of Social Gatherings with Friends and Family: More than three times a week   Attends Religious Services: More than 4 times per year   Active Member of [employer] or Organizations: Yes   Attends Archivist Meetings: More than 4 times per year   Marital Status: Widowed    Tobacco Counseling Counseling given: Not Answered   Clinical Intake:  Pre-visit preparation completed: Yes    Eye exam- plans to schedule  Foot exam- notes no change. Followed by pcp.     Diabetes: Yes (Followed by pcp)  How often do you need to have someone help you when you read instructions, pamphlets, or other written materials from your doctor or pharmacy?: 1 - Never   Interpreter Needed?: No      Activities of Daily Living In your present state of health, do you have any difficulty performing the following activities: 01/25/2021  Hearing? N  Vision? N  Difficulty concentrating or making decisions? N  Walking or climbing stairs? Y  Dressing or bathing? N  Doing errands, shopping? N  Preparing Food and eating ? N  Using the Toilet? N  In the past six months, have you accidently leaked urine? Y  Do you have problems with loss of bowel control? N  Managing your Medications? N  Managing your Finances? Y  Comment Son assist as needed  Housekeeping or managing your Housekeeping? Y  Comment Maid assist  Some  recent data might be hidden    Patient Care Team: [name], MD as PCP - General (Internal Medicine)  Indicate any recent Medical Services you may have received from other than [facility] providers in the past year (date may be approximate).     Assessment:   This is a routine wellness examination for [place].  I connected with Milliani today by telephone and verified that I am speaking with the correct person using two identifiers. Location patient: home Location provider: work Persons participating in the virtual visit: patient, Marine scientist.    I discussed the limitations, risks, security and privacy concerns of performing an evaluation and management  service by telephone and the availability of in person appointments. I also discussed with the patient that there may be a patient responsible charge related to this service. The patient expressed understanding and verbally consented to this telephonic visit.    Interactive audio and video telecommunications were attempted between this provider and patient, however failed, due to patient having technical difficulties OR patient did not have access to video capability.  We continued and completed visit with audio only.  Some vital signs may be absent or patient reported.   Hearing/Vision screen Hearing Screening - Comments:: Patient is able to hear conversational tones without difficulty.  No issues reported. Vision Screening - Comments:: Followed by Dr. Ellin Mayhew  Wears reader lenses  Cataract extraction, bilateral They have seen their ophthalmologist in the last 12 months.   Dietary issues and exercise activities discussed: Current Exercise Habits: Home exercise routine, Type of exercise: walking, Intensity: Mild   Goals Addressed             This Visit's Progress    Maintain Healthy Lifestyle       Brain engagement activity (read the newspaper/book, word search etc..) Stay active Healthy diet        Depression Screen PHQ 2/9  Scores 01/25/2021 01/19/2021 01/08/2020 11/06/2019 01/04/2019 01/03/2018 10/10/2016  PHQ - 2 Score 0 0 0 0 0 0 0  PHQ- 9 Score - - - - - - 0    Fall Risk Fall Risk  01/25/2021 01/19/2021 01/08/2020 11/06/2019 01/03/2018  Falls in the past year? 0 0 0 0 No  Number falls in past yr: 0 0 0 0 -  Injury with Fall? 0 0 - 0 -  Risk for fall due to : - Impaired mobility - Impaired balance/gait;Impaired mobility -  Follow up Falls evaluation completed Falls evaluation completed Falls evaluation completed Falls evaluation completed -    FALL RISK PREVENTION PERTAINING TO THE HOME: Adequate lighting in your home to reduce risk of falls? Yes   ASSISTIVE DEVICES UTILIZED TO PREVENT FALLS: Use of a cane, walker or w/c? Yes, cane in use  Grab bars in the bathroom? Yes   TIMED UP AND GO: Was the test performed? No .   Cognitive Function:     6CIT Screen 01/25/2021 01/08/2020 01/07/2019 01/03/2018 10/10/2016  What Year? 0 points 0 points 0 points 0 points 0 points  What month? 0 points 0 points 0 points 0 points 0 points  What time? 0 points - 0 points 0 points 0 points  Count back from 20 - - 0 points 0 points 0 points  Months in reverse 0 points 0 points 0 points 0 points 0 points  Repeat phrase - 0 points - 0 points 0 points  Total Score - - - 0 0    Immunizations Immunization History  Administered Date(s) Administered   Fluad Quad(high Dose 65+) 03/06/2020   Influenza Split 03/24/2014   Influenza, High Dose Seasonal PF 03/21/2016, 02/22/2017, 02/20/2018, 03/01/2019   Influenza,inj,Quad PF,6+ Mos 03/02/2015   Influenza-Unspecified 03/14/2012, 02/17/2018   PFIZER(Purple Top)SARS-COV-2 Vaccination 07/04/2019, 07/25/2019, 03/26/2020   Pneumococcal Conjugate-13 05/06/2019   Pneumococcal Polysaccharide-23 06/26/2012   Zoster Recombinat (Shingrix) 08/21/2020, 10/22/2020   Health Maintenance Health Maintenance  Topic Date Due   OPHTHALMOLOGY EXAM  01/25/2021 (Originally 12/30/1940)   COVID-19 Vaccine (4  - Booster for [place] series) 02/04/2021 (Originally 06/26/2020)   INFLUENZA VACCINE  03/07/2021 (Originally 01/11/2021)   TETANUS/TDAP  01/19/2022 (Originally 12/30/1949)   FOOT EXAM  01/25/2022 (Originally  12/30/1940)   HEMOGLOBIN A1C  07/22/2021   DEXA SCAN  Completed   PNA vac Low Risk Adult  Completed   Zoster Vaccines- Shingrix  Completed   HPV VACCINES  Aged Out   MAMMOGRAM  Discontinued   Lung Cancer Screening: (Low Dose CT Chest recommended if Age 34-80 years, 30 pack-year currently smoking OR have quit w/in 15years.) does not qualify.   Vision Screening: Recommended annual ophthalmology exams for early detection of glaucoma and other disorders of the eye.  Dental Screening: Recommended annual dental exams for proper oral hygiene  Community Resource Referral / Chronic Care Management: CRR required this visit?  No   CCM required this visit?  No      Plan:   Keep all routine maintenance appointments.   I have personally reviewed and noted the following in the patient's chart:   Medical and social history Use of alcohol, tobacco or illicit drugs  Current medications and supplements including opioid prescriptions. Not currently taking opioid. Functional ability and status Nutritional status Physical activity Advanced directives List of other physicians Hospitalizations, surgeries, and ER visits in previous 12 months Vitals Screenings to include cognitive, depression, and falls Referrals and appointments  In addition, I have reviewed and discussed with patient certain preventive protocols, quality metrics, and best practice recommendations. A written personalized care plan for preventive services as well as general preventive health recommendations were provided to patient via mychart.     Varney Biles, LPN   [id number]

## 2021-01-25 NOTE — Patient Instructions (Signed)
[name] , Thank you for taking time to come for your Medicare Wellness Visit. I appreciate your ongoing commitment to your health goals. Please review the following plan we discussed and let me know if I can assist you in the future.   These are the goals we discussed:  Goals      Maintain Healthy Lifestyle     Brain engagement activity (read the newspaper/book, word search etc..) Stay active Healthy diet         This is a list of the screening recommended for you and due dates:  Health Maintenance  Topic Date Due   Complete foot exam   Never done   Eye exam for diabetics  Never done   COVID-19 Vaccine (4 - Booster for Pfizer series) 02/04/2021*   Flu Shot  03/07/2021*   Tetanus Vaccine  01/19/2022*   Hemoglobin A1C  07/22/2021   DEXA scan (bone density measurement)  Completed   Pneumonia vaccines  Completed   Zoster (Shingles) Vaccine  Completed   HPV Vaccine  Aged Out   Mammogram  Discontinued  *Topic was postponed. The date shown is not the original due date.    Advanced directives: End of life planning; Advance aging; Advanced directives discussed.  Copy of current HCPOA/Living Will requested.    Conditions/risks identified: none new  Follow up in one year for your annual wellness visit    Preventive Care 65 Years and Older, Female Preventive care refers to lifestyle choices and visits with your health care provider that can promote health and wellness. What does preventive care include? A yearly physical exam. This is also called an annual well check. Dental exams once or twice a year. Routine eye exams. Ask your health care provider how often you should have your eyes checked. Personal lifestyle choices, including: Daily care of your teeth and gums. Regular physical activity. Eating a healthy diet. Avoiding tobacco and drug use. Limiting alcohol use. Practicing safe sex. Taking low-dose aspirin every day. Taking vitamin and mineral supplements as  recommended by your health care provider. What happens during an annual well check? The services and screenings done by your health care provider during your annual well check will depend on your age, overall health, lifestyle risk factors, and family history of disease. Counseling  Your health care provider may ask you questions about your: Alcohol use. Tobacco use. Drug use. Emotional well-being. Home and relationship well-being. Sexual activity. Eating habits. History of falls. Memory and ability to understand (cognition). Work and work Statistician. Reproductive health. Screening  You may have the following tests or measurements: Height, weight, and BMI. Blood pressure. Lipid and cholesterol levels. These may be checked every 5 years, or more frequently if you are over 78 years old. Skin check. Lung cancer screening. You may have this screening every year starting at age 10 if you have a 30-pack-year history of smoking and currently smoke or have quit within the past 15 years. Fecal occult blood test (FOBT) of the stool. You may have this test every year starting at age 38. Flexible sigmoidoscopy or colonoscopy. You may have a sigmoidoscopy every 5 years or a colonoscopy every 10 years starting at age 24. Hepatitis C blood test. Hepatitis B blood test. Sexually transmitted disease (STD) testing. Diabetes screening. This is done by checking your blood sugar (glucose) after you have not eaten for a while (fasting). You may have this done every 1-3 years. Bone density scan. This is done to screen for osteoporosis. You  may have this done starting at age 45. Mammogram. This may be done every 1-2 years. Talk to your health care provider about how often you should have regular mammograms. Talk with your health care provider about your test results, treatment options, and if necessary, the need for more tests. Vaccines  Your health care provider may recommend certain vaccines, such  as: Influenza vaccine. This is recommended every year. Tetanus, diphtheria, and acellular pertussis (Tdap, Td) vaccine. You may need a Td booster every 10 years. Zoster vaccine. You may need this after age 67. Pneumococcal 13-valent conjugate (PCV13) vaccine. One dose is recommended after age 2. Pneumococcal polysaccharide (PPSV23) vaccine. One dose is recommended after age 23. Talk to your health care provider about which screenings and vaccines you need and how often you need them. This information is not intended to replace advice given to you by your health care provider. Make sure you discuss any questions you have with your health care provider. Document Released: 06/26/2015 Document Revised: 02/17/2016 Document Reviewed: 03/31/2015 Elsevier Interactive Patient Education  2017 [place] Prevention in the Home Falls can cause injuries. They can happen to people of all ages. There are many things you can do to make your home safe and to help prevent falls. What can I do on the outside of my home? Regularly fix the edges of walkways and driveways and fix any cracks. Remove anything that might make you trip as you walk through a door, such as a raised step or threshold. Trim any bushes or trees on the path to your home. Use bright outdoor lighting. Clear any walking paths of anything that might make someone trip, such as rocks or tools. Regularly check to see if handrails are loose or broken. Make sure that both sides of any steps have handrails. Any raised decks and porches should have guardrails on the edges. Have any leaves, snow, or ice cleared regularly. Use sand or salt on walking paths during winter. Clean up any spills in your garage right away. This includes oil or grease spills. What can I do in the bathroom? Use night lights. Install grab bars by the toilet and in the tub and shower. Do not use towel bars as grab bars. Use non-skid mats or decals in the tub or  shower. If you need to sit down in the shower, use a plastic, non-slip stool. Keep the floor dry. Clean up any water that spills on the floor as soon as it happens. Remove soap buildup in the tub or shower regularly. Attach bath mats securely with double-sided non-slip rug tape. Do not have throw rugs and other things on the floor that can make you trip. What can I do in the bedroom? Use night lights. Make sure that you have a light by your bed that is easy to reach. Do not use any sheets or blankets that are too big for your bed. They should not hang down onto the floor. Have a firm chair that has side arms. You can use this for support while you get dressed. Do not have throw rugs and other things on the floor that can make you trip. What can I do in the kitchen? Clean up any spills right away. Avoid walking on wet floors. Keep items that you use a lot in easy-to-reach places. If you need to reach something above you, use a strong step stool that has a grab bar. Keep electrical cords out of the way. Do not use floor  polish or wax that makes floors slippery. If you must use wax, use non-skid floor wax. Do not have throw rugs and other things on the floor that can make you trip. What can I do with my stairs? Do not leave any items on the stairs. Make sure that there are handrails on both sides of the stairs and use them. Fix handrails that are broken or loose. Make sure that handrails are as long as the stairways. Check any carpeting to make sure that it is firmly attached to the stairs. Fix any carpet that is loose or worn. Avoid having throw rugs at the top or bottom of the stairs. If you do have throw rugs, attach them to the floor with carpet tape. Make sure that you have a light switch at the top of the stairs and the bottom of the stairs. If you do not have them, ask someone to add them for you. What else can I do to help prevent falls? Wear shoes that: Do not have high heels. Have  rubber bottoms. Are comfortable and fit you well. Are closed at the toe. Do not wear sandals. If you use a stepladder: Make sure that it is fully opened. Do not climb a closed stepladder. Make sure that both sides of the stepladder are locked into place. Ask someone to hold it for you, if possible. Clearly mark and make sure that you can see: Any grab bars or handrails. First and last steps. Where the edge of each step is. Use tools that help you move around (mobility aids) if they are needed. These include: Canes. Walkers. Scooters. Crutches. Turn on the lights when you go into a dark area. Replace any light bulbs as soon as they burn out. Set up your furniture so you have a clear path. Avoid moving your furniture around. If any of your floors are uneven, fix them. If there are any pets around you, be aware of where they are. Review your medicines with your doctor. Some medicines can make you feel dizzy. This can increase your chance of falling. Ask your doctor what other things that you can do to help prevent falls. This information is not intended to replace advice given to you by your health care provider. Make sure you discuss any questions you have with your health care provider. Document Released: 03/26/2009 Document Revised: 11/05/2015 Document Reviewed: 07/04/2014 Elsevier Interactive Patient Education  2017 [employer].

## 2021-01-25 NOTE — Telephone Encounter (Signed)
Spoke to radiology.  Order placed for DG esophageal with double.

## 2021-01-27 ENCOUNTER — Other Ambulatory Visit: Payer: Self-pay | Admitting: *Deleted

## 2021-01-27 DIAGNOSIS — C3411 Malignant neoplasm of upper lobe, right bronchus or lung: Secondary | ICD-10-CM

## 2021-02-05 ENCOUNTER — Encounter: Payer: Self-pay | Admitting: Internal Medicine

## 2021-02-05 ENCOUNTER — Inpatient Hospital Stay (HOSPITAL_BASED_OUTPATIENT_CLINIC_OR_DEPARTMENT_OTHER): Payer: Medicare PPO | Admitting: Internal Medicine

## 2021-02-05 ENCOUNTER — Inpatient Hospital Stay: Payer: Medicare PPO | Attending: Internal Medicine

## 2021-02-05 DIAGNOSIS — Z79899 Other long term (current) drug therapy: Secondary | ICD-10-CM | POA: Insufficient documentation

## 2021-02-05 DIAGNOSIS — Z85118 Personal history of other malignant neoplasm of bronchus and lung: Secondary | ICD-10-CM | POA: Insufficient documentation

## 2021-02-05 DIAGNOSIS — C3411 Malignant neoplasm of upper lobe, right bronchus or lung: Secondary | ICD-10-CM

## 2021-02-05 DIAGNOSIS — D649 Anemia, unspecified: Secondary | ICD-10-CM | POA: Insufficient documentation

## 2021-02-05 DIAGNOSIS — M810 Age-related osteoporosis without current pathological fracture: Secondary | ICD-10-CM | POA: Insufficient documentation

## 2021-02-05 DIAGNOSIS — Z87891 Personal history of nicotine dependence: Secondary | ICD-10-CM | POA: Diagnosis not present

## 2021-02-05 DIAGNOSIS — R1310 Dysphagia, unspecified: Secondary | ICD-10-CM | POA: Insufficient documentation

## 2021-02-05 LAB — COMPREHENSIVE METABOLIC PANEL
ALT: 14 U/L (ref 0–44)
AST: 17 U/L (ref 15–41)
Albumin: 4.1 g/dL (ref 3.5–5.0)
Alkaline Phosphatase: 66 U/L (ref 38–126)
Anion gap: 8 (ref 5–15)
BUN: 17 mg/dL (ref 8–23)
CO2: 28 mmol/L (ref 22–32)
Calcium: 9.8 mg/dL (ref 8.9–10.3)
Chloride: 101 mmol/L (ref 98–111)
Creatinine, Ser: 0.96 mg/dL (ref 0.44–1.00)
GFR, Estimated: 57 mL/min — ABNORMAL LOW (ref >60)
Glucose, Bld: 120 mg/dL — ABNORMAL HIGH (ref 70–99)
Potassium: 4.3 mmol/L (ref 3.5–5.1)
Sodium: 137 mmol/L (ref 135–145)
Total Bilirubin: 0.7 mg/dL (ref 0.3–1.2)
Total Protein: 7.9 g/dL (ref 6.5–8.1)

## 2021-02-05 LAB — CBC WITH DIFFERENTIAL/PLATELET
Abs Immature Granulocytes: 0.02 10*3/uL (ref 0.00–0.07)
Basophils Absolute: 0.1 10*3/uL (ref 0.0–0.1)
Basophils Relative: 1 %
Eosinophils Absolute: 0.1 10*3/uL (ref 0.0–0.5)
Eosinophils Relative: 2 %
HCT: 36.6 % (ref 36.0–46.0)
Hemoglobin: 12.4 g/dL (ref 12.0–15.0)
Immature Granulocytes: 0 %
Lymphocytes Relative: 27 %
Lymphs Abs: 2.1 10*3/uL (ref 0.7–4.0)
MCH: 29.9 pg (ref 26.0–34.0)
MCHC: 33.9 g/dL (ref 30.0–36.0)
MCV: 88.2 fL (ref 80.0–100.0)
Monocytes Absolute: 0.6 10*3/uL (ref 0.1–1.0)
Monocytes Relative: 7 %
Neutro Abs: 5.1 10*3/uL (ref 1.7–7.7)
Neutrophils Relative %: 63 %
Platelets: 256 10*3/uL (ref 150–400)
RBC: 4.15 MIL/uL (ref 3.87–5.11)
RDW: 12.7 % (ref 11.5–15.5)
WBC: 8 10*3/uL (ref 4.0–10.5)
nRBC: 0 % (ref 0.0–0.2)

## 2021-02-05 NOTE — Assessment & Plan Note (Addendum)
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  Currently on surveillance. CT scan August 2021-bilateral parahilar right more than left scarring suggestive of radiation changes; no evidence of any metastatic disease noted. Will repeat CT scan in 1-2 weeks.   # Cough-less likely due to malignancy.  Question allergy/acid reflux.  continue-antihistamine/PPI.   # Chocking/dysphagia- Awaiting esophagram-Dr.Scott  # Mild  Anemia- improved- Hb 12.2-STABLE.   # FBG- 120; recommend dietary discretion no diagnosed with diabetes.  Continue follow-up with PCP.  # DISPOSITION: will call  # schedule CT chest in 1-2 weeks.  # Follow up in 6 months- MD; labs-cbc/cmp;- -Dr.B  Cc; Dr.Scott

## 2021-02-05 NOTE — Progress Notes (Signed)
[facility] OFFICE PROGRESS NOTE  Patient Care Team: [name], Heather as PCP - General (Internal Medicine)  Cancer Staging No matching staging information was found for the patient.   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  Starting radiation and chemotherapy from April 14, 2014 Patient was started on carboplatinum andTaxol Herve were developed an allergic reaction to Taxol so would be changed to Abraxane 3.patient has finished 6 cycles of weekly chemotherapy with carboplatinum  and radiation therapy(May 28, 2014) 4.started on  NIVOLULAMAB because of persistent disease July 02, 2014. 5.  NIVOLULAMAB was discontinued because of persistent diarrhea in July of 2016.  August of 2016 CT scan was stable so no further chemotherapy  # AUG 25th PET- STABLE RUL MASS [radiation fibrosis; <1cm ? Mediastinal recurrence];   # AAA/ 3.3x 3.9 Stable [Dr.Schnier] -----------------------------------------------------   DIAGNOSIS: Lung cancer  STAGE:  IV-NED       ;GOALS: control  CURRENT/MOST RECENT THERAPY : surveillance    History of lung cancer  Malignant neoplasm of right upper lobe of lung ([place])  08/07/2019 Initial Diagnosis   Malignant neoplasm of right upper lobe of lung (Heather Cooper)    INTERVAL HISTORY:  Heather Cooper 87 y.o.  female pleasant patient above history of Right upper lobe adenoca cell Stage IV lung cancer [contralateral lower lobe lung nodule]- currently on surveillance is here for follow-up.  Patient had COVID infection in dec 2021-resolved without any complications.   Patient noted  to worsening difficulty swallowing in last few months-both solids and liquids. Awaiting esophagogram.  Patient continues to complain of mild cough more at nighttime.   Review of Systems  Constitutional:  Negative for chills, diaphoresis, fever, malaise/fatigue and weight loss.  HENT:  Negative for nosebleeds and sore throat.   Eyes:  Negative for double vision.  Respiratory:  Positive for cough. Negative for hemoptysis, sputum production, shortness of breath and wheezing.   Cardiovascular:  Negative for chest pain, palpitations, orthopnea and leg swelling.  Gastrointestinal:  Negative for abdominal pain, blood in stool, constipation, diarrhea, heartburn, melena, nausea and vomiting.  Genitourinary:  Negative for dysuria, frequency and urgency.  Musculoskeletal:  Negative for back pain and joint pain.  Skin: Negative.  Negative for itching and rash.  Neurological:  Negative for dizziness, tingling, focal weakness, weakness and headaches.  Endo/Heme/Allergies:  Does not bruise/bleed easily.  Psychiatric/Behavioral:  Negative for depression. The patient is not nervous/anxious and does not have insomnia.     PAST MEDICAL HISTORY :  Past Medical History:  Diagnosis Date   Chicken pox    Hypercalcemia    familial hypocalciuric hypercalcemia   Hypercholesterolemia    Hypertension    Lung cancer ([place])    Osteoporosis    Thyroid disease     PAST SURGICAL HISTORY :   Past Surgical History:  Procedure Laterality Date   ABDOMINAL HYSTERECTOMY     partial   CHOLECYSTECTOMY     COLONOSCOPY WITH PROPOFOL N/A 04/17/2017   Procedure: COLONOSCOPY WITH PROPOFOL;  Surgeon: Manya Silvas, Heather;  Location: [facility] ENDOSCOPY;  Service: Endoscopy;  Laterality: N/A;   transvaginal hysterectomy  04/18/05   with anterior colporrhaphy    FAMILY HISTORY :   Family History  Problem Relation Age of Onset   Stroke Mother    Hypertension Mother    Prostate cancer Father    Cancer Father        prostate    Heart disease Brother        s/p CABG   Colon cancer Neg Hx     SOCIAL HISTORY:   Social History   Tobacco Use   Smoking status: Former    Types: Cigarettes    Quit date: 06/13/1997    Years since quitting: 23.6   Smokeless tobacco: Never  Vaping Use   Vaping Use: Never used  Substance Use Topics   Alcohol use: No    Alcohol/week: 0.0 standard drinks   Drug use: No    ALLERGIES:  is allergic to paclitaxel.  MEDICATIONS:  Current Outpatient Medications  Medication Sig Dispense Refill   Ascorbic Acid (VITAMIN C) 1000 MG tablet Take 1,000 mg by mouth daily.     cholecalciferol (VITAMIN D3) 25 MCG (1000 UNIT) tablet Take 1,000 Units by mouth daily.     fluticasone (FLONASE) 50 MCG/ACT nasal spray Place 2 sprays into both nostrils daily. 16 g 3   levocetirizine (XYZAL) 5 MG tablet Take 1 tablet (5 mg total) by mouth every evening. 90 tablet 2   olmesartan-hydrochlorothiazide (BENICAR HCT) 20-12.5 MG tablet TAKE 1 TABLET BY MOUTH ONCE DAILY 90 tablet 1   pantoprazole (PROTONIX) 40 MG tablet TAKE 1 TABLET BY MOUTH ONCE DAILY 90 tablet 3   No current facility-administered medications for this visit.   Facility-Administered Medications Ordered in Other Visits  Medication Dose Route Frequency Provider Last Rate Last Admin   sodium chloride 0.9 % 1,000 mL with potassium chloride 20 mEq, magnesium sulfate 2 g infusion   Intravenous Continuous Choksi, Delorise Shiner, Heather   Stopped at 12/04/14 1300    PHYSICAL EXAMINATION: ECOG PERFORMANCE STATUS: 0 - Asymptomatic  BP (!) 142/66 (BP Location: Left Arm, Patient Position: Sitting, Cuff Size: Normal)   Pulse 75   Temp 98.5 F (36.9 C) (Tympanic)   Resp 16   Ht 5' 3"  (1.6 m)   Wt 152 lb 9.6 oz (69.2 kg)   SpO2 100%   BMI 27.03 kg/m   Filed Weights   02/05/21 1045  Weight: 152 lb 9.6 oz (69.2 kg)    Physical Exam Constitutional:      Comments: Accompanied her daughter.  Walking herself  HENT:     Head: Normocephalic and  atraumatic.     Mouth/Throat:     Pharynx: No oropharyngeal exudate.  Eyes:     Pupils: Pupils are equal, round, and reactive to light.  Cardiovascular:     Rate and Rhythm: Normal rate and regular rhythm.  Pulmonary:     Effort: Pulmonary effort is normal. No respiratory distress.     Breath sounds: Normal breath sounds. No wheezing.  Abdominal:     General: Bowel sounds are normal. There is no distension.     Palpations: Abdomen is soft. There is no mass.     Tenderness: no abdominal tenderness There is no guarding or rebound.  Musculoskeletal:        General: No tenderness. Normal range of motion.     Cervical back: Normal range of motion and neck supple.  Skin:    General: Skin is warm.  Neurological:     Mental Status: She is alert and oriented to  person, place, and time.  Psychiatric:        Mood and Affect: Affect normal.     LABORATORY DATA:  I have reviewed the data as listed    Component Value Date/Time   NA 137 02/05/2021 1000   NA 130 (L) 10/09/2014 0846   K 4.3 02/05/2021 1000   K 4.1 10/09/2014 0846   CL 101 02/05/2021 1000   CL 98 (L) 10/09/2014 0846   CO2 28 02/05/2021 1000   CO2 25 10/09/2014 0846   GLUCOSE 120 (H) 02/05/2021 1000   GLUCOSE 161 (H) 10/09/2014 0846   BUN 17 02/05/2021 1000   BUN 14 10/09/2014 0846   CREATININE 0.96 02/05/2021 1000   CREATININE 0.70 10/09/2014 0846   CALCIUM 9.8 02/05/2021 1000   CALCIUM 9.1 10/09/2014 0846   PROT 7.9 02/05/2021 1000   PROT 7.3 10/09/2014 0846   ALBUMIN 4.1 02/05/2021 1000   ALBUMIN 3.6 10/09/2014 0846   AST 17 02/05/2021 1000   AST 16 10/09/2014 0846   ALT 14 02/05/2021 1000   ALT 13 (L) 10/09/2014 0846   ALKPHOS 66 02/05/2021 1000   ALKPHOS 70 10/09/2014 0846   BILITOT 0.7 02/05/2021 1000   BILITOT 1.0 10/09/2014 0846   GFRNONAA 57 (L) 02/05/2021 1000   GFRNONAA >60 10/09/2014 0846   GFRAA >60 02/05/2020 1004   GFRAA >60 10/09/2014 0846    No results found for: SPEP, UPEP  Lab Results   Component Value Date   WBC 8.0 02/05/2021   NEUTROABS 5.1 02/05/2021   HGB 12.4 02/05/2021   HCT 36.6 02/05/2021   MCV 88.2 02/05/2021   PLT 256 02/05/2021      Chemistry      Component Value Date/Time   NA 137 02/05/2021 1000   NA 130 (L) 10/09/2014 0846   K 4.3 02/05/2021 1000   K 4.1 10/09/2014 0846   CL 101 02/05/2021 1000   CL 98 (L) 10/09/2014 0846   CO2 28 02/05/2021 1000   CO2 25 10/09/2014 0846   BUN 17 02/05/2021 1000   BUN 14 10/09/2014 0846   CREATININE 0.96 02/05/2021 1000   CREATININE 0.70 10/09/2014 0846   GLU 111 03/18/2014 1048      Component Value Date/Time   CALCIUM 9.8 02/05/2021 1000   CALCIUM 9.1 10/09/2014 0846   ALKPHOS 66 02/05/2021 1000   ALKPHOS 70 10/09/2014 0846   AST 17 02/05/2021 1000   AST 16 10/09/2014 0846   ALT 14 02/05/2021 1000   ALT 13 (L) 10/09/2014 0846   BILITOT 0.7 02/05/2021 1000   BILITOT 1.0 10/09/2014 0846       RADIOGRAPHIC STUDIES: I have personally reviewed the radiological images as listed and agreed with the findings in the report. No results found.    ASSESSMENT & PLAN:  Malignant neoplasm of right upper lobe of lung ([place]) # Right upper lobe lung cancer adeno ca- Stage IV [contralateral left lower lobe lung nodule] status post chemoradiation-s/p Nivo [aslt 2015].  Currently on surveillance. CT scan August 2021-bilateral parahilar right more than left scarring suggestive of radiation changes; no evidence of any metastatic disease noted. Will repeat CT scan in 1-2 weeks.   # Cough-less likely due to malignancy.  Question allergy/acid reflux.  continue-antihistamine/PPI.   # Chocking/dysphagia- Awaiting esophagram-Dr.Scott  # Mild  Anemia- improved- Hb 12.2-STABLE.   # FBG- 120; recommend dietary discretion no diagnosed with diabetes.  Continue follow-up with PCP.  # DISPOSITION: will call  # schedule CT chest in  1-2 weeks.  # Follow up in 6 months- Heather; labs-cbc/cmp;- -Dr.B  Cc; Dr.Scott   No orders of  the defined types were placed in this encounter.  All questions were answered. The patient knows to call the clinic with any problems, questions or concerns.      Cammie Sickle, Heather 02/07/2021 6:47 PM

## 2021-02-07 ENCOUNTER — Telehealth: Payer: Self-pay | Admitting: Internal Medicine

## 2021-02-07 NOTE — Telephone Encounter (Signed)
I had ordered esophogram.  Has not been scheduled.  Can you help with this.  Let me know if I need to do something more.  Thank you.

## 2021-02-09 NOTE — Telephone Encounter (Signed)
Patient calling back in to see about being scheduled. Please advise

## 2021-02-17 ENCOUNTER — Other Ambulatory Visit: Payer: Self-pay

## 2021-02-17 ENCOUNTER — Ambulatory Visit
Admission: RE | Admit: 2021-02-17 | Discharge: 2021-02-17 | Disposition: A | Discharge status: Home / Self Care | Payer: Medicare PPO | Source: Ambulatory Visit | Attending: Internal Medicine | Admitting: Internal Medicine

## 2021-02-17 ENCOUNTER — Other Ambulatory Visit: Payer: Self-pay | Admitting: Internal Medicine

## 2021-02-17 DIAGNOSIS — C3411 Malignant neoplasm of upper lobe, right bronchus or lung: Secondary | ICD-10-CM | POA: Diagnosis not present

## 2021-02-17 DIAGNOSIS — I700 Atherosclerosis of aorta: Secondary | ICD-10-CM | POA: Diagnosis not present

## 2021-02-17 DIAGNOSIS — J439 Emphysema, unspecified: Secondary | ICD-10-CM | POA: Diagnosis not present

## 2021-02-17 DIAGNOSIS — C3490 Malignant neoplasm of unspecified part of unspecified bronchus or lung: Secondary | ICD-10-CM | POA: Diagnosis not present

## 2021-03-09 ENCOUNTER — Encounter: Payer: Self-pay | Admitting: Internal Medicine

## 2021-03-09 ENCOUNTER — Telehealth: Payer: Self-pay | Admitting: *Deleted

## 2021-03-09 DIAGNOSIS — R1310 Dysphagia, unspecified: Secondary | ICD-10-CM

## 2021-03-09 NOTE — Telephone Encounter (Signed)
Contacted patient. Results reviewed with the patient. She has left msgs for Dr. Nicki Reaper to schedule the swallowing study. She will also see Dr. Tami Ribas on 03/17/21. She is still having trouble swallowing. She stated she personally set up the apt with Dr. Tami Ribas to get his opinion on the swallowing issues. She will reach out to Dr. Bary Leriche office to see when the swallowing study can be arranged.

## 2021-03-09 NOTE — Telephone Encounter (Signed)
-----   Message from Cammie Sickle, MD sent at 03/09/2021 12:29 PM EDT ----- Regarding: CT scan results. Heather Cooper please inform patient that a CT scan does not show any evidence of cancer. However does not explain her difficulty swallowing.  Recommend she follows up with Dr. Bary Leriche recommendation regarding the x-ray of the esophagus.  Thank you Dr. Jacinto Reap  FYI\ Dr. Nicki Reaper

## 2021-03-10 NOTE — Telephone Encounter (Signed)
Received message regarding swallowing evaluation.  I called pt and apologized for delay in this being scheduled.  Order has been placed and clarified.  Can you help facilitate getting this scheduled.  It appears this has not been scheduled.  Let me know if I need to do anything more.

## 2021-03-11 NOTE — Addendum Note (Signed)
Addended by: Alisa Graff on: 03/11/2021 07:25 AM   Modules accepted: Orders

## 2021-03-11 NOTE — Telephone Encounter (Signed)
I had talked with radiology and they instructed me on which test to order.  I have reordered that test with Heather Cooper) location.  I am not sure if this is what you needed.  Let me know if I need to do anything different.  Thanks

## 2021-03-12 NOTE — Telephone Encounter (Signed)
Thank you :)

## 2021-03-15 ENCOUNTER — Ambulatory Visit (HOSPITAL_COMMUNITY)
Admission: RE | Admit: 2021-03-15 | Discharge: 2021-03-15 | Disposition: A | Discharge status: Home / Self Care | Payer: Medicare PPO | Source: Ambulatory Visit | Attending: Internal Medicine | Admitting: Internal Medicine

## 2021-03-15 ENCOUNTER — Other Ambulatory Visit: Payer: Self-pay

## 2021-03-15 DIAGNOSIS — R1310 Dysphagia, unspecified: Secondary | ICD-10-CM

## 2021-03-15 DIAGNOSIS — K219 Gastro-esophageal reflux disease without esophagitis: Secondary | ICD-10-CM | POA: Diagnosis not present

## 2021-03-16 ENCOUNTER — Telehealth: Payer: Self-pay | Admitting: Internal Medicine

## 2021-03-16 ENCOUNTER — Encounter: Payer: Self-pay | Admitting: Internal Medicine

## 2021-03-16 DIAGNOSIS — K219 Gastro-esophageal reflux disease without esophagitis: Secondary | ICD-10-CM

## 2021-03-16 DIAGNOSIS — R1319 Other dysphagia: Secondary | ICD-10-CM

## 2021-03-16 NOTE — Telephone Encounter (Signed)
Patient called Heather Cooper back. She would like to see Dr Lorrin Mais , in [place].

## 2021-03-16 NOTE — Telephone Encounter (Signed)
Referral placed to GI- Dr Carlean Purl

## 2021-03-17 DIAGNOSIS — R1310 Dysphagia, unspecified: Secondary | ICD-10-CM | POA: Diagnosis not present

## 2021-03-17 DIAGNOSIS — H903 Sensorineural hearing loss, bilateral: Secondary | ICD-10-CM | POA: Diagnosis not present

## 2021-03-17 DIAGNOSIS — H6123 Impacted cerumen, bilateral: Secondary | ICD-10-CM | POA: Diagnosis not present

## 2021-03-30 DIAGNOSIS — H903 Sensorineural hearing loss, bilateral: Secondary | ICD-10-CM | POA: Diagnosis not present

## 2021-04-07 ENCOUNTER — Telehealth: Payer: Self-pay

## 2021-04-07 ENCOUNTER — Other Ambulatory Visit: Payer: Self-pay

## 2021-04-07 ENCOUNTER — Ambulatory Visit (INDEPENDENT_AMBULATORY_CARE_PROVIDER_SITE_OTHER): Payer: Medicare PPO | Admitting: Gastroenterology

## 2021-04-07 ENCOUNTER — Other Ambulatory Visit: Payer: Self-pay | Admitting: Internal Medicine

## 2021-04-07 ENCOUNTER — Encounter: Payer: Self-pay | Admitting: Gastroenterology

## 2021-04-07 VITALS — BP 168/86 | HR 92 | Temp 96.2°F | Ht 63.0 in | Wt 149.4 lb

## 2021-04-07 DIAGNOSIS — R1319 Other dysphagia: Secondary | ICD-10-CM

## 2021-04-07 DIAGNOSIS — K219 Gastro-esophageal reflux disease without esophagitis: Secondary | ICD-10-CM

## 2021-04-07 NOTE — Telephone Encounter (Signed)
Referral was previously placed to GI in [place]. Patient has chose to see [facility] for earlier appt. Referral has been placed and patient is seeing Dr Allen Norris this morning. You can done this referral.
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
## 2021-04-08 ENCOUNTER — Other Ambulatory Visit: Payer: Self-pay

## 2021-04-08 DIAGNOSIS — R1319 Other dysphagia: Secondary | ICD-10-CM

## 2021-04-13 ENCOUNTER — Ambulatory Visit: Payer: Medicare PPO | Admitting: Registered Nurse

## 2021-04-13 ENCOUNTER — Encounter
Admission: RE | Disposition: A | Discharge status: Home / Self Care | Payer: Self-pay | Source: Ambulatory Visit | Attending: Gastroenterology

## 2021-04-13 ENCOUNTER — Encounter: Payer: Self-pay | Admitting: Gastroenterology

## 2021-04-13 ENCOUNTER — Ambulatory Visit
Admission: RE | Admit: 2021-04-13 | Discharge: 2021-04-13 | Disposition: A | Discharge status: Home / Self Care | Payer: Medicare PPO | Source: Ambulatory Visit | Attending: Gastroenterology | Admitting: Gastroenterology

## 2021-04-13 DIAGNOSIS — Z87891 Personal history of nicotine dependence: Secondary | ICD-10-CM | POA: Insufficient documentation

## 2021-04-13 DIAGNOSIS — D001 Carcinoma in situ of esophagus: Secondary | ICD-10-CM | POA: Diagnosis not present

## 2021-04-13 DIAGNOSIS — Z9221 Personal history of antineoplastic chemotherapy: Secondary | ICD-10-CM | POA: Diagnosis not present

## 2021-04-13 DIAGNOSIS — R1319 Other dysphagia: Secondary | ICD-10-CM | POA: Diagnosis not present

## 2021-04-13 DIAGNOSIS — C3490 Malignant neoplasm of unspecified part of unspecified bronchus or lung: Secondary | ICD-10-CM | POA: Insufficient documentation

## 2021-04-13 DIAGNOSIS — Z79899 Other long term (current) drug therapy: Secondary | ICD-10-CM | POA: Insufficient documentation

## 2021-04-13 DIAGNOSIS — Z823 Family history of stroke: Secondary | ICD-10-CM | POA: Insufficient documentation

## 2021-04-13 DIAGNOSIS — Z923 Personal history of irradiation: Secondary | ICD-10-CM | POA: Insufficient documentation

## 2021-04-13 DIAGNOSIS — K219 Gastro-esophageal reflux disease without esophagitis: Secondary | ICD-10-CM | POA: Insufficient documentation

## 2021-04-13 DIAGNOSIS — Z8249 Family history of ischemic heart disease and other diseases of the circulatory system: Secondary | ICD-10-CM | POA: Insufficient documentation

## 2021-04-13 DIAGNOSIS — K222 Esophageal obstruction: Secondary | ICD-10-CM | POA: Diagnosis not present

## 2021-04-13 DIAGNOSIS — R1310 Dysphagia, unspecified: Secondary | ICD-10-CM | POA: Diagnosis not present

## 2021-04-13 DIAGNOSIS — C154 Malignant neoplasm of middle third of esophagus: Secondary | ICD-10-CM | POA: Insufficient documentation

## 2021-04-13 DIAGNOSIS — Z8042 Family history of malignant neoplasm of prostate: Secondary | ICD-10-CM | POA: Insufficient documentation

## 2021-04-13 DIAGNOSIS — Z85118 Personal history of other malignant neoplasm of bronchus and lung: Secondary | ICD-10-CM | POA: Diagnosis not present

## 2021-04-13 HISTORY — PX: ESOPHAGOGASTRODUODENOSCOPY (EGD) WITH PROPOFOL: SHX5813

## 2021-04-13 SURGERY — ESOPHAGOGASTRODUODENOSCOPY (EGD) WITH PROPOFOL
Anesthesia: General

## 2021-04-13 MED ORDER — LIDOCAINE HCL (CARDIAC) PF 100 MG/5ML IV SOSY
PREFILLED_SYRINGE | INTRAVENOUS | Status: DC | PRN
  Administered 2021-04-13: 40 mg via INTRAVENOUS

## 2021-04-13 MED ORDER — SODIUM CHLORIDE 0.9 % IV SOLN: INTRAVENOUS | Status: DC

## 2021-04-13 MED ORDER — PROPOFOL 500 MG/50ML IV EMUL
INTRAVENOUS | Status: DC | PRN
  Administered 2021-04-13: 150 ug/kg/min via INTRAVENOUS

## 2021-04-13 MED ORDER — PROPOFOL 10 MG/ML IV BOLUS
INTRAVENOUS | Status: DC | PRN
  Administered 2021-04-13: 70 mg via INTRAVENOUS
  Administered 2021-04-13: 10 mg via INTRAVENOUS

## 2021-04-13 NOTE — Anesthesia Postprocedure Evaluation (Signed)
Anesthesia Post Note  Patient: [name]  Procedure(s) Performed: ESOPHAGOGASTRODUODENOSCOPY (EGD) WITH PROPOFOL  Patient location during evaluation: PACU Anesthesia Type: General Level of consciousness: awake and alert, oriented and patient cooperative Pain management: pain level controlled Vital Signs Assessment: post-procedure vital signs reviewed and stable Respiratory status: spontaneous breathing, nonlabored ventilation and respiratory function stable Cardiovascular status: blood pressure returned to baseline and stable Postop Assessment: adequate PO intake Anesthetic complications: no   No notable events documented.   Last Vitals:  Vitals:   04/13/21 1054  BP: (!) 159/77  Pulse: (!) 102  Resp: 17  Temp: (!) 36 C  SpO2: 100%    Last Pain:  Vitals:   04/13/21 1200  TempSrc:   PainSc: 0-No pain                 Darrin Nipper

## 2021-04-13 NOTE — Anesthesia Procedure Notes (Signed)
Date/Time: 04/13/2021 11:30 AM Performed by: Doreen Salvage, CRNA Pre-anesthesia Checklist: Patient identified, Emergency Drugs available, Suction available and Patient being monitored Patient Re-evaluated:Patient Re-evaluated prior to induction Oxygen Delivery Method: Nasal cannula Induction Type: IV induction Dental Injury: Teeth and Oropharynx as per pre-operative assessment  Comments: Nasal cannula with etCO2 monitoring

## 2021-04-13 NOTE — Transfer of Care (Signed)
Immediate Anesthesia Transfer of Care Note  Patient: Heather Cooper  Procedure(s) Performed: ESOPHAGOGASTRODUODENOSCOPY (EGD) WITH PROPOFOL  Patient Location: PACU and Endoscopy Unit  Anesthesia Type:General  Level of Consciousness: drowsy  Airway & Oxygen Therapy: Patient Spontanous Breathing  Post-op Assessment: Report given to RN and Post -op Vital signs reviewed and stable  Post vital signs: Reviewed and stable  Last Vitals:  Vitals Value Taken Time  BP 112/57   Temp    Pulse 66 04/13/21 1148  Resp 22 04/13/21 1148  SpO2 98 % 04/13/21 1148  Vitals shown include unvalidated device data.  Last Pain:  Vitals:   04/13/21 1054  TempSrc: Temporal  PainSc: 0-No pain         Complications: No notable events documented.

## 2021-04-13 NOTE — Interval H&P Note (Signed)
Heather Lame, MD [address] [place], [place] [address] Phone:[phone] Fax : [phone]  Primary Care Physician:  [name], MD Primary Gastroenterologist:  Dr. Allen Norris  Pre-Procedure History & Physical: HPI:  Heather Cooper is a 87 y.o. female is here for an colonoscopy.   Past Medical History:  Diagnosis Date   Chicken pox    Hypercalcemia    familial hypocalciuric hypercalcemia   Hypercholesterolemia    Hypertension    Lung cancer ([place])    Osteoporosis    Thyroid disease     Past Surgical History:  Procedure Laterality Date   ABDOMINAL HYSTERECTOMY     partial   CHOLECYSTECTOMY     COLONOSCOPY WITH PROPOFOL N/A 04/17/2017   Procedure: COLONOSCOPY WITH PROPOFOL;  Surgeon: Manya Silvas, MD;  Location: [facility] ENDOSCOPY;  Service: Endoscopy;  Laterality: N/A;   transvaginal hysterectomy  04/18/05   with anterior colporrhaphy    Prior to Admission medications   Medication Sig Start Date End Date Taking? Authorizing Provider  levocetirizine (XYZAL) 5 MG tablet Take 1 tablet (5 mg total) by mouth every evening. 05/11/20  Yes [name], MD  olmesartan-hydrochlorothiazide (BENICAR HCT) 20-12.5 MG tablet TAKE 1 TABLET BY MOUTH ONCE DAILY 10/12/20  Yes [name], MD  pantoprazole (PROTONIX) 40 MG tablet TAKE 1 TABLET BY MOUTH ONCE DAILY 04/07/21  Yes [name], MD  Ascorbic Acid (VITAMIN C) 1000 MG tablet Take 1,000 mg by mouth daily.    [provider]  cholecalciferol (VITAMIN D3) 25 MCG (1000 UNIT) tablet Take 1,000 Units by mouth daily.    [provider]  fluticasone (FLONASE) 50 MCG/ACT nasal spray Place 2 sprays into both nostrils daily. 05/11/20   [name], MD    Allergies as of 04/08/2021 - Review Complete 04/07/2021  Allergen Reaction Noted   Paclitaxel Other (See Comments) 09/13/2014    Family History  Problem Relation Age of Onset   Stroke Mother    Hypertension Mother    Prostate cancer  Father    Cancer Father        prostate   Heart disease Brother        s/p CABG   Colon cancer Neg Hx     Social History   Socioeconomic History   Marital status: Widowed    Spouse name: Not on file   Number of children: 3   Years of education: Not on file   Highest education level: Not on file  Occupational History   Not on file  Tobacco Use   Smoking status: Former    Types: Cigarettes    Quit date: 06/13/1997    Years since quitting: 23.8   Smokeless tobacco: Never  Vaping Use   Vaping Use: Never used  Substance and Sexual Activity   Alcohol use: No    Alcohol/week: 0.0 standard drinks   Drug use: No   Sexual activity: Not on file  Other Topics Concern   Not on file  Social History Narrative   Lost her husband November 2019   Social Determinants of Health   Financial Resource Strain: Low Risk    Difficulty of Paying Living Expenses: Not hard at all  Food Insecurity: No Food Insecurity   Worried About Charity fundraiser in the Last Year: Never true   Arboriculturist in the Last Year: Never true  Transportation Needs: No Transportation Needs   Lack of Transportation (Medical): No   Lack of Transportation (Non-Medical): No  Physical Activity: Unknown   Days of Exercise per Week: 0 days   Minutes of Exercise per Session: Not on file  Stress: No Stress Concern Present   Feeling of Stress : Not at all  Social Connections: Moderately Integrated   Frequency of Communication with Friends and Family: More than three times a week   Frequency of Social Gatherings with Friends and Family: More than three times a week   Attends Religious Services: More than 4 times per year   Active Member of [employer] or Organizations: Yes   Attends Archivist Meetings: More than 4 times per year   Marital Status: Widowed  Human resources officer Violence: Not At Risk   Fear of Current or Ex-Partner: No   Emotionally Abused: No   Physically Abused: No   Sexually Abused: No     Review of Systems: See HPI, otherwise negative ROS  Physical Exam: BP (!) 159/77   Pulse (!) 102   Temp (!) 96.8 F (36 C) (Temporal)   Ht 5\' 3"  (1.6 m)   Wt 66.2 kg   SpO2 100%   BMI 25.86 kg/m  General:   Alert,  pleasant and cooperative in NAD Head:  Normocephalic and atraumatic. Neck:  Supple; no masses or thyromegaly. Lungs:  Clear throughout to auscultation.    Heart:  Regular rate and rhythm. Abdomen:  Soft, nontender and nondistended. Normal bowel sounds, without guarding, and without rebound.   Neurologic:  Alert and  oriented x4;  grossly normal neurologically.  Impression/Plan: [name] is here for an colonoscopy to be performed for dysphagia  Risks, benefits, limitations, and alternatives regarding  colonoscopy have been reviewed with the patient.  Questions have been answered.  All parties agreeable.   Heather Lame, MD  04/13/2021, 11:39 AM

## 2021-04-13 NOTE — Op Note (Addendum)
[facility] Gastroenterology Patient Name: Heather Cooper Procedure Date: 04/13/2021 11:11 AM MRN: [id number] Account #: [id number] Date of Birth: 09-Jul-1930 Admit Type: Outpatient Age: 87 Room: [facility] ENDO ROOM 4 Gender: Female Note Status: Finalized Instrument Name: Upper Endoscope [id number] Procedure:             Upper GI endoscopy Indications:           Dysphagia Providers:             [name] MD, MD Medicines:             Propofol per Anesthesia Complications:         No immediate complications. Procedure:             Pre-Anesthesia Assessment:                        - Prior to the procedure, a History and Physical was                         performed, and patient medications and allergies were                         reviewed. The patient's tolerance of previous                         anesthesia was also reviewed. The risks and benefits                         of the procedure and the sedation options and risks                         were discussed with the patient. All questions were                         answered, and informed consent was obtained. Prior                         Anticoagulants: The patient has taken no previous                         anticoagulant or antiplatelet agents. ASA Grade                         Assessment: II - A patient with mild systemic disease.                         After reviewing the risks and benefits, the patient                         was deemed in satisfactory condition to undergo the                         procedure.                        After obtaining informed consent, the endoscope was                         passed under direct vision. Throughout the procedure,  the patient's blood pressure, pulse, and oxygen                         saturations were monitored continuously. The Endoscope                         was introduced through the mouth, and advanced to the                          second part of duodenum. The upper GI endoscopy was                         accomplished without difficulty. The patient tolerated                         the procedure well. Findings:      One severe stenosis was found 24 cm from the incisors. The stenosis was       traversed after dilation. A TTS dilator was passed through the scope.       Dilation with a 03-24-11 mm balloon dilator was performed to 12 mm. The       dilation site was examined following endoscope reinsertion and showed       mild improvement in luminal narrowing. This was biopsied with a cold       forceps for histology.      The stomach was normal.      The examined duodenum was normal. Impression:            - Esophageal stenosis. Dilated. Biopsied.                        - Normal stomach.                        - Normal examined duodenum. Recommendation:        - Discharge patient to home.                        - Resume previous diet.                        - Continue present medications.                        - Await pathology results. Procedure Code(s):     --- Professional ---                        [phone], Esophagogastroduodenoscopy, flexible,                         transoral; with transendoscopic balloon dilation of                         esophagus (less than 30 mm diameter)                        43239, 59, Esophagogastroduodenoscopy, flexible,                         transoral; with biopsy, single or multiple CPT copyright 2019 American Medical Association. All rights  reserved. The codes documented in this report are preliminary and upon coder review may  be revised to meet current compliance requirements. [name] MD, MD 04/13/2021 11:37:58 AM This report has been signed electronically. Number of Addenda: 0 Note Initiated On: 04/13/2021 11:11 AM Estimated Blood Loss:  Estimated blood loss: none.      [facility]

## 2021-04-13 NOTE — Anesthesia Preprocedure Evaluation (Signed)
Anesthesia Evaluation  Patient identified by MRN, date of birth, ID band Patient awake    Reviewed: Allergy & Precautions, NPO status , Patient's Chart, lab work & pertinent test results  History of Anesthesia Complications Negative for: history of anesthetic complications  Airway Mallampati: I   Neck ROM: Full    Dental  (+) Lower Dentures, Upper Dentures   Pulmonary former smoker (quit 1999),  Lung CA s/p chemo and radiation   Pulmonary exam normal breath sounds clear to auscultation       Cardiovascular hypertension, + Peripheral Vascular Disease (AAA <4 cm in 07/2020)  Normal cardiovascular exam Rhythm:Regular Rate:Normal     Neuro/Psych  Headaches,    GI/Hepatic GERD  ,  Endo/Other  negative endocrine ROS  Renal/GU negative Renal ROS     Musculoskeletal   Abdominal   Peds  Hematology negative hematology ROS (+)   Anesthesia Other Findings   Reproductive/Obstetrics                             Anesthesia Physical Anesthesia Plan  ASA: 2  Anesthesia Plan: General   Post-op Pain Management:    Induction: Intravenous  PONV Risk Score and Plan: 3 and Propofol infusion, TIVA and Treatment may vary due to age or medical condition  Airway Management Planned: Natural Airway  Additional Equipment:   Intra-op Plan:   Post-operative Plan:   Informed Consent: I have reviewed the patients History and Physical, chart, labs and discussed the procedure including the risks, benefits and alternatives for the proposed anesthesia with the patient or authorized representative who has indicated his/her understanding and acceptance.       Plan Discussed with: CRNA  Anesthesia Plan Comments:         Anesthesia Quick Evaluation

## 2021-04-14 LAB — SURGICAL PATHOLOGY

## 2021-04-15 ENCOUNTER — Telehealth: Payer: Self-pay

## 2021-04-15 NOTE — Telephone Encounter (Signed)
LVM for pt to return my call to schedule a follow up to discuss EGD results.

## 2021-04-16 NOTE — Telephone Encounter (Signed)
Pt returned my call and has been scheduled for a follow up appt to discuss results.

## 2021-04-19 ENCOUNTER — Encounter: Payer: Self-pay | Admitting: Gastroenterology

## 2021-04-19 ENCOUNTER — Other Ambulatory Visit: Payer: Self-pay

## 2021-04-19 ENCOUNTER — Ambulatory Visit (INDEPENDENT_AMBULATORY_CARE_PROVIDER_SITE_OTHER): Payer: Medicare PPO | Admitting: Gastroenterology

## 2021-04-19 ENCOUNTER — Other Ambulatory Visit: Payer: Self-pay | Admitting: Internal Medicine

## 2021-04-19 VITALS — BP 144/78 | HR 91 | Temp 97.6°F | Ht 63.0 in | Wt 149.0 lb

## 2021-04-19 DIAGNOSIS — K2289 Other specified disease of esophagus: Secondary | ICD-10-CM

## 2021-04-19 NOTE — H&P (View-Only) (Signed)
Primary Care Physician: [name], MD  Primary Gastroenterologist:  Dr. Lucilla Cooper  Chief Complaint  Patient presents with   Follow-up    EGD results    HPI: Heather Cooper is a 87 y.o. female here for follow-up after having an upper endoscopy for dysphagia.  The patient had a stricture noted that was treated with dilation and biopsies were taken.  The biopsies came back as "At least carcinoma in situ". The patient is now here to follow up with the results of the pathology.  Past Medical History:  Diagnosis Date   Chicken pox    Hypercalcemia    familial hypocalciuric hypercalcemia   Hypercholesterolemia    Hypertension    Lung cancer ([facility])    Osteoporosis    Thyroid disease     Current Outpatient Medications  Medication Sig Dispense Refill   Ascorbic Acid (VITAMIN C) 1000 MG tablet Take 1,000 mg by mouth daily.     cholecalciferol (VITAMIN D3) 25 MCG (1000 UNIT) tablet Take 1,000 Units by mouth daily.     fluticasone (FLONASE) 50 MCG/ACT nasal spray Place 2 sprays into both nostrils daily. 16 g 3   levocetirizine (XYZAL) 5 MG tablet Take 1 tablet (5 mg total) by mouth every evening. 90 tablet 2   olmesartan-hydrochlorothiazide (BENICAR HCT) 20-12.5 MG tablet TAKE 1 TABLET BY MOUTH ONCE DAILY 90 tablet 1   pantoprazole (PROTONIX) 40 MG tablet TAKE 1 TABLET BY MOUTH ONCE DAILY 90 tablet 3   No current facility-administered medications for this visit.   Facility-Administered Medications Ordered in Other Visits  Medication Dose Route Frequency Provider Last Rate Last Admin   sodium chloride 0.9 % 1,000 mL with potassium chloride 20 mEq, magnesium sulfate 2 g infusion   Intravenous Continuous Forest Gleason, MD   Stopped at 12/04/14 1300    Allergies as of 04/19/2021 - Review Complete 04/19/2021  Allergen Reaction Noted   Paclitaxel Other (See Comments) 09/13/2014    ROS:  General: Negative for anorexia, weight loss, fever, chills, fatigue, weakness. ENT:  Negative for hoarseness, difficulty swallowing , nasal congestion. CV: Negative for chest pain, angina, palpitations, dyspnea on exertion, peripheral edema.  Respiratory: Negative for dyspnea at rest, dyspnea on exertion, cough, sputum, wheezing.  GI: See history of present illness. GU:  Negative for dysuria, hematuria, urinary incontinence, urinary frequency, nocturnal urination.  Endo: Negative for unusual weight change.    Physical Examination:   BP (!) 144/78 (BP Location: Left Arm, Patient Position: Sitting, Cuff Size: Normal)   Pulse 91   Temp 97.6 F (36.4 C) (Temporal)   Ht 5\' 3"  (1.6 m)   Wt 149 lb (67.6 kg)   BMI 26.39 kg/m   General: Well-nourished, well-developed in no acute distress.  Eyes: No icterus. Conjunctivae pink. Neuro: Alert and oriented x 3.  Grossly intact. Skin: Warm and dry, no jaundice.   Psych: Alert and cooperative, normal mood and affect.  Labs:    Imaging Studies: No results found.  Assessment and Plan:   Heather Cooper is a 87 y.o. y/o female who comes in today after having upper endoscopy with a stricture seen with irregular mucosa.  The biopsy showed at least carcinoma in situ. The patient reports that after dilation she feels much better and is eating well.  She will be set up for an endoscopic ultrasound with possible further biopsying of the lesion and to stage the depth of this tumor.  I discussed this with her oncologist and he  agrees with the plan.  The patient will be set up for an EUS.     Heather Lame, MD. Marval Regal    Note: This dictation was prepared with Dragon dictation along with smaller phrase technology. Any transcriptional errors that result from this process are unintentional.

## 2021-04-19 NOTE — Progress Notes (Signed)
Primary Care Physician: [name], MD  Primary Gastroenterologist:  Dr. [name]  Chief Complaint  Patient presents with   Follow-up    EGD results    HPI: Heather Cooper is a 87 y.o. female here for follow-up after having an upper endoscopy for dysphagia.  The patient had a stricture noted that was treated with dilation and biopsies were taken.  The biopsies came back as "At least carcinoma in situ". The patient is now here to follow up with the results of the pathology.  Past Medical History:  Diagnosis Date   Chicken pox    Hypercalcemia    familial hypocalciuric hypercalcemia   Hypercholesterolemia    Hypertension    Lung cancer (HCC)    Osteoporosis    Thyroid disease     Current Outpatient Medications  Medication Sig Dispense Refill   Ascorbic Acid (VITAMIN C) 1000 MG tablet Take 1,000 mg by mouth daily.     cholecalciferol (VITAMIN D3) 25 MCG (1000 UNIT) tablet Take 1,000 Units by mouth daily.     fluticasone (FLONASE) 50 MCG/ACT nasal spray Place 2 sprays into both nostrils daily. 16 g 3   levocetirizine (XYZAL) 5 MG tablet Take 1 tablet (5 mg total) by mouth every evening. 90 tablet 2   olmesartan-hydrochlorothiazide (BENICAR HCT) 20-12.5 MG tablet TAKE 1 TABLET BY MOUTH ONCE DAILY 90 tablet 1   pantoprazole (PROTONIX) 40 MG tablet TAKE 1 TABLET BY MOUTH ONCE DAILY 90 tablet 3   No current facility-administered medications for this visit.   Facility-Administered Medications Ordered in Other Visits  Medication Dose Route Frequency Provider Last Rate Last Admin   sodium chloride 0.9 % 1,000 mL with potassium chloride 20 mEq, magnesium sulfate 2 g infusion   Intravenous Continuous Forest Gleason, MD   Stopped at 12/04/14 1300    Allergies as of 04/19/2021 - Review Complete 04/19/2021  Allergen Reaction Noted   Paclitaxel Other (See Comments) 09/13/2014    ROS:  General: Negative for anorexia, weight loss, fever, chills, fatigue, weakness. ENT:  Negative for hoarseness, difficulty swallowing , nasal congestion. CV: Negative for chest pain, angina, palpitations, dyspnea on exertion, peripheral edema.  Respiratory: Negative for dyspnea at rest, dyspnea on exertion, cough, sputum, wheezing.  GI: See history of present illness. GU:  Negative for dysuria, hematuria, urinary incontinence, urinary frequency, nocturnal urination.  Endo: Negative for unusual weight change.    Physical Examination:   BP (!) 144/78 (BP Location: Left Arm, Patient Position: Sitting, Cuff Size: Normal)   Pulse 91   Temp 97.6 F (36.4 C) (Temporal)   Ht 5\' 3"  (1.6 m)   Wt 149 lb (67.6 kg)   BMI 26.39 kg/m   General: Well-nourished, well-developed in no acute distress.  Eyes: No icterus. Conjunctivae pink. Neuro: Alert and oriented x 3.  Grossly intact. Skin: Warm and dry, no jaundice.   Psych: Alert and cooperative, normal mood and affect.  Labs:    Imaging Studies: No results found.  Assessment and Plan:   Heather Cooper is a 87 y.o. y/o female who comes in today after having upper endoscopy with a stricture seen with irregular mucosa.  The biopsy showed at least carcinoma in situ. The patient reports that after dilation she feels much better and is eating well.  She will be set up for an endoscopic ultrasound with possible further biopsying of the lesion and to stage the depth of this tumor.  I discussed this with her oncologist and he  agrees with the plan.  The patient will be set up for an EUS.     [name], MD. Marval Regal    Note: This dictation was prepared with Dragon dictation along with smaller phrase technology. Any transcriptional errors that result from this process are unintentional.

## 2021-04-20 ENCOUNTER — Other Ambulatory Visit: Payer: Self-pay

## 2021-04-20 ENCOUNTER — Telehealth: Payer: Self-pay

## 2021-04-20 ENCOUNTER — Telehealth: Payer: Self-pay | Admitting: Internal Medicine

## 2021-04-20 DIAGNOSIS — C155 Malignant neoplasm of lower third of esophagus: Secondary | ICD-10-CM

## 2021-04-20 DIAGNOSIS — C3411 Malignant neoplasm of upper lobe, right bronchus or lung: Secondary | ICD-10-CM

## 2021-04-20 NOTE — Telephone Encounter (Signed)
EUS has been scheduled for 04/22/21 with Dr. Jola Schmidt at [facility]. Instructions have been sent to her MyChart and were reviewed. All questions answered. Denies any anticoagulant use or diabetes. Encouraged to call with any further questions.

## 2021-04-20 NOTE — Telephone Encounter (Signed)
EUS has been scheduled for 04/22/21.

## 2021-04-20 NOTE — Telephone Encounter (Signed)
Her follow up is scheduled are you calling or should I?

## 2021-04-20 NOTE — Telephone Encounter (Signed)
Discussed with Dr. Allen Norris. Spoke to patient regarding given the concerns for esophagus cancer-recommend a PET scan for further evaluation.   Patient interested in proceeding with EUS/discussed with Dr. Allen Norris.  Patient wants to have the EUS done ASAP/willing to travel to [facility] if needed. Kristie please follow up [name] Dr.Wohl's office.   Schedule ASAP; follow-up 1 to 2 days later.no labs  FYI-Dr.Scott

## 2021-04-21 NOTE — Telephone Encounter (Signed)
Done and pt was call LVM about appt they are also active on mychart

## 2021-04-21 NOTE — Telephone Encounter (Signed)
Can you please call her after her PET is also scheduled. Thanks.

## 2021-04-22 ENCOUNTER — Encounter: Payer: Self-pay | Admitting: Gastroenterology

## 2021-04-22 ENCOUNTER — Ambulatory Visit
Admission: RE | Admit: 2021-04-22 | Discharge: 2021-04-22 | Disposition: A | Discharge status: Home / Self Care | Payer: Medicare PPO | Source: Ambulatory Visit | Attending: Gastroenterology | Admitting: Gastroenterology

## 2021-04-22 ENCOUNTER — Encounter
Admission: RE | Disposition: A | Discharge status: Home / Self Care | Payer: Self-pay | Source: Ambulatory Visit | Attending: Gastroenterology

## 2021-04-22 ENCOUNTER — Ambulatory Visit: Payer: Medicare PPO | Admitting: Anesthesiology

## 2021-04-22 DIAGNOSIS — K222 Esophageal obstruction: Secondary | ICD-10-CM | POA: Diagnosis not present

## 2021-04-22 DIAGNOSIS — E7800 Pure hypercholesterolemia, unspecified: Secondary | ICD-10-CM | POA: Diagnosis not present

## 2021-04-22 DIAGNOSIS — D001 Carcinoma in situ of esophagus: Secondary | ICD-10-CM | POA: Insufficient documentation

## 2021-04-22 DIAGNOSIS — C159 Malignant neoplasm of esophagus, unspecified: Secondary | ICD-10-CM | POA: Diagnosis present

## 2021-04-22 DIAGNOSIS — C153 Malignant neoplasm of upper third of esophagus: Secondary | ICD-10-CM | POA: Diagnosis not present

## 2021-04-22 HISTORY — PX: EUS: SHX5427

## 2021-04-22 SURGERY — ULTRASOUND, UPPER GI TRACT, ENDOSCOPIC
Anesthesia: General

## 2021-04-22 MED ORDER — PROPOFOL 10 MG/ML IV BOLUS
INTRAVENOUS | Status: DC | PRN
  Administered 2021-04-22: 50 mg via INTRAVENOUS

## 2021-04-22 MED ORDER — LIDOCAINE HCL (CARDIAC) PF 100 MG/5ML IV SOSY
PREFILLED_SYRINGE | INTRAVENOUS | Status: DC | PRN
  Administered 2021-04-22: 60 mg via INTRAVENOUS

## 2021-04-22 MED ORDER — EPHEDRINE SULFATE 50 MG/ML IJ SOLN
INTRAMUSCULAR | Status: DC | PRN
  Administered 2021-04-22 (×2): 5 mg via INTRAVENOUS

## 2021-04-22 MED ORDER — PROPOFOL 500 MG/50ML IV EMUL
INTRAVENOUS | Status: DC | PRN
  Administered 2021-04-22: 140 ug/kg/min via INTRAVENOUS

## 2021-04-22 MED ORDER — SODIUM CHLORIDE 0.9 % IV SOLN: INTRAVENOUS | Status: DC

## 2021-04-22 MED ORDER — PHENYLEPHRINE HCL (PRESSORS) 10 MG/ML IV SOLN
INTRAVENOUS | Status: DC | PRN
  Administered 2021-04-22: 80 ug via INTRAVENOUS

## 2021-04-22 MED ORDER — LIDOCAINE HCL (PF) 1 % IJ SOLN
INTRAMUSCULAR | Status: AC
  Administered 2021-04-22: 25 mL
  Filled 2021-04-22: qty 2

## 2021-04-22 NOTE — Interval H&P Note (Signed)
History and Physical Interval Note:  04/22/2021 12:43 PM  Heather Cooper  has presented today for surgery, with the diagnosis of SQUAMOUS CELL CARCINOMA IN SITU esophagus.  The various methods of treatment have been discussed with the patient and family. After consideration of risks, benefits and other options for treatment, the patient has consented to  Procedure(s) with comments: FULL UPPER ENDOSCOPIC ULTRASOUND (EUS) RADIAL (N/A) - [facility] needed as a surgical intervention.  The patient's history has been reviewed, patient examined, no change in status, stable for surgery.  I have reviewed the patient's chart and labs.  Questions were answered to the patient's satisfaction.    Discussed with patient and her daughter. No Abd pain. Abd soft Non tender. No overt masses. Patient agreeable to proceed.  [name]

## 2021-04-22 NOTE — Transfer of Care (Signed)
Immediate Anesthesia Transfer of Care Note  Patient: Heather Cooper  Procedure(s) Performed: FULL UPPER ENDOSCOPIC ULTRASOUND (EUS) RADIAL  Patient Location: PACU  Anesthesia Type:General  Level of Consciousness: awake, alert  and oriented  Airway & Oxygen Therapy: Patient Spontanous Breathing  Post-op Assessment: Report given to RN and Post -op Vital signs reviewed and stable  Post vital signs: Reviewed and stable  Last Vitals:  Vitals Value Taken Time  BP 188/90 04/22/21 1326  Temp    Pulse 68 04/22/21 1327  Resp 21 04/22/21 1327  SpO2 93 % 04/22/21 1327  Vitals shown include unvalidated device data.  Last Pain:  Vitals:   04/22/21 1205  TempSrc: Temporal         Complications: No notable events documented.

## 2021-04-22 NOTE — Op Note (Signed)
[facility] Gastroenterology Patient Name: Heather Cooper Procedure Date: 04/22/2021 12:32 PM MRN: [id number] Account #: [id number] Date of Birth: Oct 30, 1930 Admit Type: Outpatient Age: 87 Room: [facility] ENDO ROOM 3 Gender: Female Note Status: Finalized Instrument Name: Radial EUS [id number],BWIOM Endoscope [id number] Procedure:             Upper EUS Indications:           Staging of squamous cell esophageal Providers:             [name] Referring MD:          [name], MD (Referring MD), Charlaine Dalton                         (Referring MD) Medicines:             Monitored Anesthesia Care Complications:         No immediate complications. Procedure:             Pre-Anesthesia Assessment:                        - Please see pre-anesthesia assessment documentation                         already completed in Epic.                        After obtaining informed consent, the endoscope was                         passed under direct vision. Throughout the procedure,                         the patient's blood pressure, pulse, and oxygen                         saturations were monitored continuously. The EUS scope                         was introduced through the mouth, and advanced to the                         upper third of esophagus. The Endoscope was introduced                         through the mouth, and advanced to the second part of                         duodenum. The upper EUS was accomplished without                         difficulty. The patient tolerated the procedure well. Findings:      ENDOSCOPIC FINDING: :      A medium-sized, fungating mass with no bleeding and no stigmata of       recent bleeding was found in the upper third of the esophagus, 22 to 24       cm from the incisors. The mass was partially obstructing and       circumferential. Biopsies were taken with a cold forceps for histology.  Endoscope passed with minimal  resistance.      The entire examined stomach was endoscopically normal.      The examined duodenum was endoscopically normal.      ENDOSONOGRAPHIC FINDING: :      A hypoechoic mass was found in the upper third of the esophagus. The       mass was encountered at 22 cm from the incisors. The endosonographic       borders were poorly-defined. There was sonographic evidence suggesting       invasion into the muscularis propria (Layer 4). An intact interface was       seen between the mass and the adjacent structures suggesting a lack of       invasion. NOTE: the EUS scope could not pass through the stenosis which       could therefore only be examined from the proximal edge.      No abnormal lymph nodes seen in the limited views of the mediastinum. Impression:            - Partially obstructing esophageal tumor was found in                         the upper third of the esophagus. Biopsied.                        - Normal stomach.                        - Normal examined duodenum.                        EUS findings:                        - NOTE: the EUS scope could not pass through the                         stenosis which could therefore only be examined from                         the proximal end; the distal end of the tumor could                         not be assessed with EUS.                        - A mass was found in the upper third of the                         esophagus. Tissue was obtained from this exam, and                         results are pending. However, the endosonographic                         appearance is consistent with squamous cell carcinoma.                         EUS staging is [id number]. The tumor appears to involve  the muscularis propria but on the visualized images                         did not extend through the muscularis propria.                         Complete staging not possible as unable to examine the                          more distal end of the tumor with EUS and also not                         able to adequately assess for lymph nodes. The staging                         applies if malignancy is confirmed. Recommendation:        - Patient has a contact number available for                         emergencies. The signs and symptoms of potential                         delayed complications were discussed with the patient.                         Return to normal activities tomorrow. Written                         discharge instructions were provided to the patient.                        - Await path results.                        - Return to referring physician.                        - The findings and recommendations were discussed with                         the patient and their family. Procedure Code(s):     --- Professional ---                        [phone], Esophagogastroduodenoscopy, flexible,                         transoral; with endoscopic ultrasound examination                         limited to the esophagus, stomach or duodenum, and                         adjacent structures                        [id number], Esophagogastroduodenoscopy, flexible,                         transoral; with biopsy, single  or multiple Diagnosis Code(s):     --- Professional ---                        D49.0, Neoplasm of unspecified behavior of digestive                         system                        K22.8, Other specified diseases of esophagus                        C15.9, Malignant neoplasm of esophagus, unspecified CPT copyright 2019 American Medical Association. All rights reserved. The codes documented in this report are preliminary and upon coder review may  be revised to meet current compliance requirements. [name],  04/22/2021 1:36:38 PM This report has been signed electronically. Number of Addenda: 0 Note Initiated On: 04/22/2021 12:32 PM Estimated Blood Loss:  Estimated blood loss  was minimal.      [facility]

## 2021-04-22 NOTE — Anesthesia Preprocedure Evaluation (Signed)
Anesthesia Evaluation  Patient identified by MRN, date of birth, ID band Patient awake    Reviewed: Allergy & Precautions, NPO status , Patient's Chart, lab work & pertinent test results  History of Anesthesia Complications Negative for: history of anesthetic complications  Airway Mallampati: III  TM Distance: >3 FB Neck ROM: full    Dental  (+) Missing   Pulmonary neg pulmonary ROS, neg shortness of breath, former smoker,    Pulmonary exam normal        Cardiovascular Exercise Tolerance: Good hypertension, (-) anginaNormal cardiovascular exam     Neuro/Psych  Headaches, negative psych ROS   GI/Hepatic Neg liver ROS, GERD  ,  Endo/Other  diabetes, Type 2  Renal/GU negative Renal ROS  negative genitourinary   Musculoskeletal   Abdominal   Peds  Hematology negative hematology ROS (+)   Anesthesia Other Findings Past Medical History: No date: Chicken pox No date: Hypercalcemia     Comment:  familial hypocalciuric hypercalcemia No date: Hypercholesterolemia No date: Hypertension No date: Lung cancer ([place]) No date: Osteoporosis No date: Thyroid disease  Past Surgical History: No date: ABDOMINAL HYSTERECTOMY     Comment:  partial No date: CHOLECYSTECTOMY 04/17/2017: COLONOSCOPY WITH PROPOFOL; N/A     Comment:  Procedure: COLONOSCOPY WITH PROPOFOL;  Surgeon: Manya Silvas, MD;  Location: [facility] ENDOSCOPY;  Service:               Endoscopy;  Laterality: N/A; 04/13/2021: ESOPHAGOGASTRODUODENOSCOPY (EGD) WITH PROPOFOL; N/A     Comment:  Procedure: ESOPHAGOGASTRODUODENOSCOPY (EGD) WITH               PROPOFOL;  Surgeon: [name], MD;  Location: [facility]               ENDOSCOPY;  Service: Endoscopy;  Laterality: N/A; 04/18/05: transvaginal hysterectomy     Comment:  with anterior colporrhaphy     Reproductive/Obstetrics negative OB ROS                             Anesthesia  Physical Anesthesia Plan  ASA: 3  Anesthesia Plan: General   Post-op Pain Management:    Induction: Intravenous  PONV Risk Score and Plan: Propofol infusion and TIVA  Airway Management Planned: Natural Airway and Nasal Cannula  Additional Equipment:   Intra-op Plan:   Post-operative Plan:   Informed Consent: I have reviewed the patients History and Physical, chart, labs and discussed the procedure including the risks, benefits and alternatives for the proposed anesthesia with the patient or authorized representative who has indicated his/her understanding and acceptance.     Dental Advisory Given  Plan Discussed with: Anesthesiologist, CRNA and Surgeon  Anesthesia Plan Comments: (Patient consented for risks of anesthesia including but not limited to:  - adverse reactions to medications - risk of airway placement if required - damage to eyes, teeth, lips or other oral mucosa - nerve damage due to positioning  - sore throat or hoarseness - Damage to heart, brain, nerves, lungs, other parts of body or loss of life  Patient voiced understanding.)        Anesthesia Quick Evaluation

## 2021-04-22 NOTE — Anesthesia Postprocedure Evaluation (Signed)
Anesthesia Post Note  Patient: Heather Cooper  Procedure(s) Performed: FULL UPPER ENDOSCOPIC ULTRASOUND (EUS) RADIAL  Patient location during evaluation: PACU Anesthesia Type: General Level of consciousness: awake and alert Pain management: pain level controlled Vital Signs Assessment: post-procedure vital signs reviewed and stable Respiratory status: spontaneous breathing, nonlabored ventilation, respiratory function stable and patient connected to nasal cannula oxygen Cardiovascular status: blood pressure returned to baseline and stable Postop Assessment: no apparent nausea or vomiting Anesthetic complications: no   No notable events documented.   Last Vitals:  Vitals:   04/22/21 1337 04/22/21 1347  BP: (!) 118/51 (!) 110/57  Pulse:    Resp:    Temp:    SpO2:      Last Pain:  Vitals:   04/22/21 1357  TempSrc:   PainSc: 0-No pain                 Precious Haws Wrenn Willcox

## 2021-04-23 ENCOUNTER — Other Ambulatory Visit: Payer: Self-pay | Admitting: Anatomic Pathology & Clinical Pathology

## 2021-04-23 ENCOUNTER — Encounter: Payer: Self-pay | Admitting: Gastroenterology

## 2021-04-23 LAB — SURGICAL PATHOLOGY

## 2021-04-27 ENCOUNTER — Ambulatory Visit: Payer: Medicare PPO | Admitting: Internal Medicine

## 2021-04-29 ENCOUNTER — Other Ambulatory Visit: Payer: Self-pay | Admitting: *Deleted

## 2021-04-29 ENCOUNTER — Other Ambulatory Visit: Payer: Medicare PPO

## 2021-04-29 DIAGNOSIS — C155 Malignant neoplasm of lower third of esophagus: Secondary | ICD-10-CM

## 2021-04-29 NOTE — Progress Notes (Signed)
I spoke to patient regarding the results of the biopsy. Positive for cancer. Await pet scan.  Wants to hold off a port placement. In agreement with evaluation with Dr. Mcneil Sober- Please set up an appointment with Dr. Baruch Gouty regarding esophagus cancer.

## 2021-05-04 ENCOUNTER — Other Ambulatory Visit: Payer: Self-pay

## 2021-05-04 ENCOUNTER — Ambulatory Visit
Admission: RE | Admit: 2021-05-04 | Discharge: 2021-05-04 | Disposition: A | Discharge status: Home / Self Care | Payer: Medicare PPO | Source: Ambulatory Visit | Attending: Internal Medicine | Admitting: Internal Medicine

## 2021-05-04 DIAGNOSIS — C155 Malignant neoplasm of lower third of esophagus: Secondary | ICD-10-CM | POA: Insufficient documentation

## 2021-05-04 LAB — GLUCOSE, CAPILLARY: Glucose-Capillary: 124 mg/dL — ABNORMAL HIGH (ref 70–99)

## 2021-05-04 MED ORDER — FLUDEOXYGLUCOSE F - 18 (FDG) INJECTION
7.7000 | Freq: Once | INTRAVENOUS | Status: AC | PRN
  Administered 2021-05-04: 8.09 via INTRAVENOUS

## 2021-05-10 ENCOUNTER — Other Ambulatory Visit: Payer: Self-pay

## 2021-05-10 ENCOUNTER — Inpatient Hospital Stay: Payer: Medicare PPO | Attending: Internal Medicine | Admitting: Internal Medicine

## 2021-05-10 ENCOUNTER — Ambulatory Visit
Admission: RE | Admit: 2021-05-10 | Discharge: 2021-05-10 | Disposition: A | Discharge status: Home / Self Care | Payer: Medicare PPO | Source: Ambulatory Visit | Attending: Radiation Oncology | Admitting: Radiation Oncology

## 2021-05-10 DIAGNOSIS — C781 Secondary malignant neoplasm of mediastinum: Secondary | ICD-10-CM | POA: Diagnosis not present

## 2021-05-10 DIAGNOSIS — D490 Neoplasm of unspecified behavior of digestive system: Secondary | ICD-10-CM | POA: Diagnosis not present

## 2021-05-10 DIAGNOSIS — D649 Anemia, unspecified: Secondary | ICD-10-CM | POA: Diagnosis not present

## 2021-05-10 DIAGNOSIS — Z87891 Personal history of nicotine dependence: Secondary | ICD-10-CM | POA: Diagnosis not present

## 2021-05-10 DIAGNOSIS — C3411 Malignant neoplasm of upper lobe, right bronchus or lung: Secondary | ICD-10-CM | POA: Insufficient documentation

## 2021-05-10 DIAGNOSIS — C154 Malignant neoplasm of middle third of esophagus: Secondary | ICD-10-CM | POA: Diagnosis not present

## 2021-05-10 NOTE — Progress Notes (Signed)
START ON PATHWAY REGIMEN - Gastroesophageal     Administer weekly during RT:     Paclitaxel      Carboplatin   **Always confirm dose/schedule in your pharmacy ordering system**  Patient Characteristics: Esophageal & GE Junction, Squamous Cell, Preoperative or Nonsurgical Candidate (Clinical Staging), cT2 or Higher or cN+, Unresectable/Nonsurgical Candidate (Any cT) Histology: Squamous Cell Disease Classification: Esophageal Therapeutic Status: Preoperative or Nonsurgical Candidate (Clinical Staging) AJCC M Category: cM0 AJCC 8 Stage Grouping: Unknown AJCC Grade: G2 AJCC Location: Middle AJCC T Category: cTX AJCC N Category: cN2 Intent of Therapy: Non-Curative / Palliative Intent, Discussed with Patient

## 2021-05-10 NOTE — Progress Notes (Signed)
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  Starting radiation and chemotherapy from April 14, 2014 Patient was started on carboplatinum andTaxol Herve were developed an allergic reaction to Taxol so would be changed to Abraxane 3.patient has finished 6 cycles of weekly chemotherapy with carboplatinum  and radiation therapy(May 28, 2014) 4.started on  NIVOLULAMAB because of persistent disease July 02, 2014. 5.  NIVOLULAMAB was discontinued because of persistent diarrhea in July of 2016.  August of 2016 CT scan was stable so no further chemotherapy  # AUG 25th PET- STABLE RUL MASS [radiation fibrosis; <1cm ? Mediastinal recurrence];   # AAA/ 3.3x 3.9 Stable [Dr.Schnier] -----------------------------------------------------   DIAGNOSIS: Lung cancer  STAGE:  IV-NED       ;GOALS: control  CURRENT/MOST RECENT THERAPY : surveillance    History of lung cancer  Malignant neoplasm of right upper lobe of lung ([place])  08/07/2019 Initial Diagnosis   Malignant neoplasm of right upper lobe of lung ([facility])    INTERVAL HISTORY: Ambulating with a cane.  Accompanied by her daughter.  Heather Cooper 87 y.o.  female pleasant patient newly diagnosed squamous cell carcinoma of the  midesophagus; with a remote hx of Right upper lobe adenoca cell Stage IV lung cancer [contralateral lower lobe lung nodule];  is here today with results of her PET scan biopsy is here for follow-up.  In the interim patient underwent upper endoscopy concerning for malignant stricture.  S/p dilatation.  Patient underwent endoscopic ultrasound for further evaluation-however unable to traverse the stricture.  Patient swallowing has improved.   Review of Systems  Constitutional:  Negative for chills, diaphoresis, fever, malaise/fatigue and weight loss.  HENT:  Negative for nosebleeds and sore throat.   Eyes:  Negative for double vision.  Respiratory:  Positive for cough. Negative for hemoptysis, sputum production, shortness of breath and wheezing.   Cardiovascular:  Negative for chest pain, palpitations, orthopnea and leg swelling.  Gastrointestinal:  Negative for abdominal pain, blood in stool, constipation, diarrhea, heartburn, melena, nausea and vomiting.  Genitourinary:  Negative for dysuria, frequency and urgency.  Musculoskeletal:  Negative for back pain and joint pain.  Skin: Negative.  Negative for itching and rash.  Neurological:  Negative for dizziness, tingling, focal weakness, weakness and headaches.  Endo/Heme/Allergies:  Does not bruise/bleed easily.  Psychiatric/Behavioral:  Negative for depression. The patient is not nervous/anxious and does not have insomnia.     PAST MEDICAL HISTORY :  Past Medical History:  Diagnosis Date  . Chicken pox   . Esophageal cancer ([place])   . Hypercalcemia    familial hypocalciuric hypercalcemia  . Hypercholesterolemia   . Hypertension   . Lung cancer ([place])   . Osteoporosis   . Thyroid disease     PAST SURGICAL HISTORY :   Past Surgical  History:  Procedure Laterality Date  . ABDOMINAL HYSTERECTOMY     partial  . CHOLECYSTECTOMY    . COLONOSCOPY WITH PROPOFOL N/A 04/17/2017   Procedure: COLONOSCOPY WITH PROPOFOL;  Surgeon: Manya Silvas, MD;  Location: [facility] ENDOSCOPY;  Service: Endoscopy;  Laterality: N/A;  . ESOPHAGOGASTRODUODENOSCOPY (EGD) WITH PROPOFOL N/A 04/13/2021   Procedure: ESOPHAGOGASTRODUODENOSCOPY (EGD) WITH PROPOFOL;  Surgeon: [name], MD;  Location: [facility] ENDOSCOPY;  Service: Endoscopy;  Laterality: N/A;  . EUS N/A 04/22/2021   Procedure: FULL UPPER ENDOSCOPIC ULTRASOUND (EUS) RADIAL;  Surgeon: Jola Schmidt, MD;  Location: [facility] ENDOSCOPY;  Service: Endoscopy;  Laterality: N/A;  [facility] needed  . transvaginal hysterectomy  04/18/05   with anterior colporrhaphy    FAMILY HISTORY :   Family History  Problem Relation Age of Onset  . Stroke Mother   . Hypertension Mother   . Prostate cancer Father   . Cancer Father        prostate  . Heart disease Brother        s/p CABG  . Colon cancer Neg Hx     SOCIAL HISTORY:   Social History   Tobacco Use  . Smoking status: Former    Types: Cigarettes    Quit date: 06/13/1997    Years since quitting: 23.9  . Smokeless tobacco: Never  Vaping Use  . Vaping Use: Never used  Substance Use Topics  . Alcohol use: No    Alcohol/week: 0.0 standard drinks  . Drug use: No    ALLERGIES:  is allergic to paclitaxel.  MEDICATIONS:  Current Outpatient Medications  Medication Sig Dispense Refill  . Ascorbic Acid (VITAMIN C) 1000 MG tablet Take 1,000 mg by mouth daily.    . cholecalciferol (VITAMIN D3) 25 MCG (1000 UNIT) tablet Take 1,000 Units by mouth daily.    . fluticasone (FLONASE) 50 MCG/ACT nasal spray Place 2 sprays into both nostrils daily. 16 g 3  . levocetirizine (XYZAL) 5 MG tablet Take 1 tablet (5 mg total) by mouth every evening. 90 tablet 2  . olmesartan-hydrochlorothiazide (BENICAR HCT) 20-12.5 MG tablet TAKE 1 TABLET BY MOUTH ONCE DAILY 90 tablet 1  . pantoprazole (PROTONIX) 40 MG tablet TAKE 1 TABLET BY MOUTH ONCE DAILY 90 tablet 3   No current facility-administered medications for this visit.   Facility-Administered Medications Ordered  in Other Visits  Medication Dose Route Frequency Provider Last Rate Last Admin  . sodium chloride 0.9 % 1,000 mL with potassium chloride 20 mEq, magnesium sulfate 2 g infusion   Intravenous Continuous Forest Gleason, MD   Stopped at 12/04/14 1300    PHYSICAL EXAMINATION: ECOG PERFORMANCE STATUS: 0 - Asymptomatic  BP 134/84   Pulse 91   Temp 97.6 F (36.4 C)   Resp 16   Wt 148 lb 12.8 oz (67.5 kg)   BMI 26.36 kg/m   Filed Weights   05/10/21 1028  Weight: 148 lb 12.8 oz (67.5 kg)    Physical Exam HENT:     Head: Normocephalic and atraumatic.     Mouth/Throat:     Pharynx: No oropharyngeal exudate.  Eyes:     Pupils: Pupils are equal, round, and reactive to light.  Cardiovascular:     Rate and Rhythm: Normal rate and regular rhythm.  Pulmonary:     Effort: Pulmonary effort is normal. No respiratory distress.     Breath sounds: Normal breath sounds. No wheezing.  Abdominal:     General: Bowel sounds are normal. There is no  distension.     Palpations: Abdomen is soft. There is no mass.     Tenderness: There is no abdominal tenderness. There is no guarding or rebound.  Musculoskeletal:        General: No tenderness. Normal range of motion.     Cervical back: Normal range of motion and neck supple.  Skin:    General: Skin is warm.  Neurological:     Mental Status: She is alert and oriented to person, place, and time.  Psychiatric:        Mood and Affect: Affect normal.     LABORATORY DATA:  I have reviewed the data as listed    Component Value Date/Time   NA 137 02/05/2021 1000   NA 130 (L) 10/09/2014 0846   K 4.3 02/05/2021 1000   K 4.1 10/09/2014 0846   CL 101 02/05/2021 1000   CL 98 (L) 10/09/2014 0846   CO2 28 02/05/2021 1000   CO2 25 10/09/2014 0846   GLUCOSE 120 (H) 02/05/2021 1000   GLUCOSE 161 (H) 10/09/2014 0846   BUN 17 02/05/2021 1000   BUN 14 10/09/2014 0846   CREATININE 0.96 02/05/2021 1000   CREATININE 0.70 10/09/2014 0846   CALCIUM 9.8  02/05/2021 1000   CALCIUM 9.1 10/09/2014 0846   PROT 7.9 02/05/2021 1000   PROT 7.3 10/09/2014 0846   ALBUMIN 4.1 02/05/2021 1000   ALBUMIN 3.6 10/09/2014 0846   AST 17 02/05/2021 1000   AST 16 10/09/2014 0846   ALT 14 02/05/2021 1000   ALT 13 (L) 10/09/2014 0846   ALKPHOS 66 02/05/2021 1000   ALKPHOS 70 10/09/2014 0846   BILITOT 0.7 02/05/2021 1000   BILITOT 1.0 10/09/2014 0846   GFRNONAA 57 (L) 02/05/2021 1000   GFRNONAA >60 10/09/2014 0846   GFRAA >60 02/05/2020 1004   GFRAA >60 10/09/2014 0846    No results found for: SPEP, UPEP  Lab Results  Component Value Date   WBC 8.0 02/05/2021   NEUTROABS 5.1 02/05/2021   HGB 12.4 02/05/2021   HCT 36.6 02/05/2021   MCV 88.2 02/05/2021   PLT 256 02/05/2021      Chemistry      Component Value Date/Time   NA 137 02/05/2021 1000   NA 130 (L) 10/09/2014 0846   K 4.3 02/05/2021 1000   K 4.1 10/09/2014 0846   CL 101 02/05/2021 1000   CL 98 (L) 10/09/2014 0846   CO2 28 02/05/2021 1000   CO2 25 10/09/2014 0846   BUN 17 02/05/2021 1000   BUN 14 10/09/2014 0846   CREATININE 0.96 02/05/2021 1000   CREATININE 0.70 10/09/2014 0846   GLU 111 03/18/2014 1048      Component Value Date/Time   CALCIUM 9.8 02/05/2021 1000   CALCIUM 9.1 10/09/2014 0846   ALKPHOS 66 02/05/2021 1000   ALKPHOS 70 10/09/2014 0846   AST 17 02/05/2021 1000   AST 16 10/09/2014 0846   ALT 14 02/05/2021 1000   ALT 13 (L) 10/09/2014 0846   BILITOT 0.7 02/05/2021 1000   BILITOT 1.0 10/09/2014 0846       RADIOGRAPHIC STUDIES: I have personally reviewed the radiological images as listed and agreed with the findings in the report. No results found.    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  Moderate metabolic activity associated the small subcarinal lymph node.  See below for incidental findings  #Recommend concurrent chemoradiation-for locally advanced squamous cell cancer of the esophagus. I reviewed the options with the patient- of palliative chemotherapy and radiation therapy concurrently.  Patient is not a surgical candidate.  Understands treatments are not curative.   #  However there is a 40% chance of complete response from chemoradiation.   Usually the treatments last 4-5 weeks and I would recommend generally weekly carbo-abraxaen [previous Taxol anaphylaxis]/ along with radiation Monday through Friday. Patient be candidate for immunotherapy if she has progressive disease..    Right upper lobe lung cancer adeno ca- Stage IV [contralateral left lower lobe lung nodule] status post chemoradiation-s/p Nivo [aslt 2015]. NOV 16th-PET-no evidence of recurrent lung cancer.   # Mild  Anemia- improved- Hb 12.2-STABLE.   # FBG- 120; recommend dietary discretion no diagnosed with diabetes.  Monitor closely on chemotherapy.  # DISPOSITION:  # chemo education- carbo-Abraxane weekly # referrral to [facility] re: esophagus cancer # Follow up in 2 weeks- MD; labs- cbc/cmp; carbo-abraxane-Dr.B  # I reviewed the blood work- with the patient in detail; also reviewed the imaging independently [as summarized above]; and with the patient in detail.   Cc; Dr.Scott   Orders Placed This Encounter  Procedures  . CBC with Differential/Platelet    Standing Status:   Future    Standing Expiration Date:   05/10/2022  . Comprehensive metabolic panel    Standing Status:   Future    Standing Expiration Date:   05/10/2022  . Ambulatory Referral to [facility] Nutrition    Referral Priority:   Routine    Referral Type:   Consultation    Referral Reason:   Specialty Services Required    Number of Visits Requested:   1    All questions were answered. The patient knows to call the clinic with  any problems, questions or concerns.      Cammie Sickle, MD 05/10/2021 12:07 PM

## 2021-05-10 NOTE — Assessment & Plan Note (Addendum)
#  Squamous cell carcinoma of the midesophagus- TxN1M0 [locally advanced; no evidence of distant metastatic disease] NOV 23rd- 2022-  Short segment intense hypermetabolic activity in the mid esophagus corresponds to focal narrowing on comparison esophagram.  Moderate metabolic activity associated the small subcarinal lymph node.  See below for incidental findings  #Recommend concurrent chemoradiation-for locally advanced squamous cell cancer of the esophagus. I reviewed the options with the patient- of palliative chemotherapy and radiation therapy concurrently.  Patient is not a surgical candidate.  Understands treatments are not curative.   #  However there is a 40% chance of complete response from chemoradiation.   Usually the treatments last 4-5 weeks and I would recommend generally weekly carbo-abraxaen [previous Taxol anaphylaxis]/ along with radiation Monday through Friday. Patient be candidate for immunotherapy if she has progressive disease..    Right upper lobe lung cancer adeno ca- Stage IV [contralateral left lower lobe lung nodule] status post chemoradiation-s/p Nivo [aslt 2015]. NOV 16th-PET-no evidence of recurrent lung cancer.   # Mild  Anemia- improved- Hb 12.2-STABLE.   # FBG- 120; recommend dietary discretion no diagnosed with diabetes.  Monitor closely on chemotherapy.  # DISPOSITION:  # chemo education- carbo-Abraxane weekly # referrral to [facility] re: esophagus cancer # Follow up in 2 weeks- MD; labs- cbc/cmp; carbo-abraxane-Dr.B  # I reviewed the blood work- with the patient in detail; also reviewed the imaging independently [as summarized above]; and with the patient in detail.   Cc; Dr.Scott

## 2021-05-10 NOTE — Progress Notes (Signed)
Patient had esophageal dilation 4 weeks ago and swallowing has improved.

## 2021-05-10 NOTE — Consult Note (Signed)
NEW PATIENT EVALUATION  Name: Heather Cooper  MRN: [id number]  Date:   05/10/2021     DOB: [birth year]   This 87 y.o. female patient presents to the clinic for initial evaluation of stage II (T2 N1 M0 moderately differentiated squamous cell carcinoma of the midesophagus in patient previously treated for.  Probable stage IV (T4 N2 M1) adenocarcinoma the right lung back in 2015 with concurrent chemoradiation  REFERRING PHYSICIAN: [name], MD  CHIEF COMPLAINT:  Chief Complaint  Patient presents with   Esophageal Cancer    Initial consultation    DIAGNOSIS: There were no encounter diagnoses.   PREVIOUS INVESTIGATIONS:  CT scans PET CT scans reviewed compatible with above-stated findings Clinical notes reviewed Pathology report reviewed  HPI: Patient is an 87 year old female well-known to our department having been treated back in 2015 with concurrent chemoradiation therapy for presumed stage IV (T4 N2 M1) adenocarcinoma of the right lung with intrathoracic lower lobe metastasis as well as a T4 lesion with direct invasion of the mediastinum and pulmonary artery invasion.  She has done well although recently presented with some increasing dysphagia.  She underwent upper endoscopy showing a malignant stricture of the mid esophagus status postdilatation.  Endoscopic ultrasound was unable to be performed because of the stricture.  Biopsy was positive for moderately differentiated squamous cell carcinoma.  PET scan demonstrated hypermetabolic activity in the region of the stricture as well as a subcarinal lymph node.  She is seen today for consideration of concurrent chemoradiation.  She is still eating well having some slight dysphagia.  Her rate weight has remained stable.  Based on her multiple comorbidities and age she is not a surgical candidate.  Is now referred for consideration of concurrent chemoradiation.  PLANNED TREATMENT REGIMEN: IMRT radiation therapy with concurrent  chemotherapy  PAST MEDICAL HISTORY:  has a past medical history of Chicken pox, Esophageal cancer ([place]), Hypercalcemia, Hypercholesterolemia, Hypertension, Lung cancer ([place]), Osteoporosis, and Thyroid disease.    PAST SURGICAL HISTORY:  Past Surgical History:  Procedure Laterality Date   ABDOMINAL HYSTERECTOMY     partial   CHOLECYSTECTOMY     COLONOSCOPY WITH PROPOFOL N/A 04/17/2017   Procedure: COLONOSCOPY WITH PROPOFOL;  Surgeon: Manya Silvas, MD;  [facility]COPY;  Service: Endoscopy;  Laterality: N/A;   ESOPHAGOGASTRODUODENOSCOPY (EGD) WITH PROPOFOL N/A 04/13/2021   Procedure: ESOPHAGOGASTRODUODENOSCOPY (EGD) WITH PROPOFOL;  Surgeon: [name], MD;  Location: [facility] ENDOSCOPY;  Service: Endoscopy;  Laterality: N/A;   EUS N/A 04/22/2021   Procedure: FULL UPPER ENDOSCOPIC ULTRASOUND (EUS) RADIAL;  Surgeon: Jola Schmidt, MD;  Location: [facility] ENDOSCOPY;  Service: Endoscopy;  Laterality: N/A;  [facility] needed   transvaginal hysterectomy  04/18/05   with anterior colporrhaphy    FAMILY HISTORY: family history includes Cancer in her father; Heart disease in her brother; Hypertension in her mother; Prostate cancer in her father; Stroke in her mother.  SOCIAL HISTORY:  reports that she quit smoking about 23 years ago. Her smoking use included cigarettes. She has never used smokeless tobacco. She reports that she does not drink alcohol and does not use drugs.  ALLERGIES: Paclitaxel  MEDICATIONS:  Current Outpatient Medications  Medication Sig Dispense Refill   Ascorbic Acid (VITAMIN C) 1000 MG tablet Take 1,000 mg by mouth daily.     cholecalciferol (VITAMIN D3) 25 MCG (1000 UNIT) tablet Take 1,000 Units by mouth daily.     fluticasone (FLONASE) 50 MCG/ACT nasal spray Place 2 sprays into both nostrils daily. 16 g  3   levocetirizine (XYZAL) 5 MG tablet Take 1 tablet (5 mg total) by mouth every evening. 90 tablet 2   olmesartan-hydrochlorothiazide (BENICAR HCT) 20-12.5 MG tablet  TAKE 1 TABLET BY MOUTH ONCE DAILY 90 tablet 1   pantoprazole (PROTONIX) 40 MG tablet TAKE 1 TABLET BY MOUTH ONCE DAILY 90 tablet 3   No current facility-administered medications for this encounter.   Facility-Administered Medications Ordered in Other Encounters  Medication Dose Route Frequency Provider Last Rate Last Admin   sodium chloride 0.9 % 1,000 mL with potassium chloride 20 mEq, magnesium sulfate 2 g infusion   Intravenous Continuous Choksi, Delorise Shiner, MD   Stopped at 12/04/14 1300    ECOG PERFORMANCE STATUS:  1 - Symptomatic but completely ambulatory  REVIEW OF SYSTEMS: Patient denies any weight loss, fatigue, weakness, fever, chills or night sweats. Patient denies any loss of vision, blurred vision. Patient denies any ringing  of the ears or hearing loss. No irregular heartbeat. Patient denies heart murmur or history of fainting. Patient denies any chest pain or pain radiating to her upper extremities. Patient denies any shortness of breath, difficulty breathing at night, cough or hemoptysis. Patient denies any swelling in the lower legs. Patient denies any nausea vomiting, vomiting of blood, or coffee ground material in the vomitus. Patient denies any stomach pain. Patient states has had normal bowel movements no significant constipation or diarrhea. Patient denies any dysuria, hematuria or significant nocturia. Patient denies any problems walking, swelling in the joints or loss of balance. Patient denies any skin changes, loss of hair or loss of weight. Patient denies any excessive worrying or anxiety or significant depression. Patient denies any problems with insomnia. Patient denies excessive thirst, polyuria, polydipsia. Patient denies any swollen glands, patient denies easy bruising or easy bleeding. Patient denies any recent infections, allergies or URI. Patient "s visual fields have not changed significantly in recent time. PHYSICAL EXAM: There were no vitals taken for this  visit. Well-developed well-nourished patient in NAD. HEENT reveals PERLA, EOMI, discs not visualized.  Oral cavity is clear. No oral mucosal lesions are identified. Neck is clear without evidence of cervical or supraclavicular adenopathy. Lungs are clear to A&P. Cardiac examination is essentially unremarkable with regular rate and rhythm without murmur rub or thrill. Abdomen is benign with no organomegaly or masses noted. Motor sensory and DTR levels are equal and symmetric in the upper and lower extremities. Cranial nerves II through XII are grossly intact. Proprioception is intact. No peripheral adenopathy or edema is identified. No motor or sensory levels are noted. Crude visual fields are within normal range.  LABORATORY DATA: Pathology report reviewed    RADIOLOGY RESULTS: PET CT scan reviewed compatible with above-stated findings   IMPRESSION: Probable stage II based on subcarinal nodal involvement of mid esophageal squamous cell carcinoma in an 87 year old female previously treated with concurrent chemoradiation for right upper lobe adenocarcinoma over 5 years prior  PLAN: At this time I have recommended concurrent chemoradiation.  I would plan on delivering 54 Gray in 30 fractions using IMRT treatment planning and delivery.  I would choose IMRT to spare critical structures such as heart spinal cord and normal lung volumes.  Also would use IMRT to restrict radiation based on her previous treatment fields 5 years prior for her lung cancer.  We will also include her subcarinal node and my treatment fields.  Risks and benefits of treatment including possible increased dysphagia secondary radiation esophagitis fatigue alteration blood count skin reaction all were reviewed in  detail with the patient and her daughter.  They both seem to comprehend my treatment plan well.  I have personally set up and ordered CT simulation and will arrange with medical oncology sequencing of her chemotherapy.  There will  be extra effort by both professional staff as well as technical staff to coordinate and manage concurrent chemoradiation and ensuing side effects during her treatments.  I would like to take this opportunity to thank you for allowing me to participate in the care of your patient.Noreene Filbert, MD

## 2021-05-12 ENCOUNTER — Ambulatory Visit
Admission: RE | Admit: 2021-05-12 | Discharge: 2021-05-12 | Disposition: A | Discharge status: Home / Self Care | Payer: Medicare PPO | Source: Ambulatory Visit | Attending: Radiation Oncology | Admitting: Radiation Oncology

## 2021-05-12 DIAGNOSIS — C771 Secondary and unspecified malignant neoplasm of intrathoracic lymph nodes: Secondary | ICD-10-CM | POA: Diagnosis not present

## 2021-05-12 DIAGNOSIS — C154 Malignant neoplasm of middle third of esophagus: Secondary | ICD-10-CM | POA: Diagnosis not present

## 2021-05-12 DIAGNOSIS — Z87891 Personal history of nicotine dependence: Secondary | ICD-10-CM | POA: Diagnosis not present

## 2021-05-13 ENCOUNTER — Other Ambulatory Visit: Payer: Self-pay

## 2021-05-13 ENCOUNTER — Inpatient Hospital Stay: Payer: Medicare PPO

## 2021-05-13 ENCOUNTER — Encounter: Payer: Self-pay | Admitting: Internal Medicine

## 2021-05-13 DIAGNOSIS — D6481 Anemia due to antineoplastic chemotherapy: Secondary | ICD-10-CM | POA: Insufficient documentation

## 2021-05-13 DIAGNOSIS — Z5111 Encounter for antineoplastic chemotherapy: Secondary | ICD-10-CM | POA: Insufficient documentation

## 2021-05-13 DIAGNOSIS — C154 Malignant neoplasm of middle third of esophagus: Secondary | ICD-10-CM | POA: Insufficient documentation

## 2021-05-13 NOTE — Progress Notes (Signed)
Nutrition Assessment   Reason for Assessment:   Referral from Dr B new esophageal cancer   ASSESSMENT: 87 year old female with stage II SCC of mid esophagus.  Past medical history of stage IV adenocarcinoma of right lung (2015), HLD, HTN.  Noted patient not a surgical candidate.  Planning palliative chemotherapy and radiation.    Met with patient and son-in-law Legrand Como.  Patient lives alone and prepares meals.  Patient reports that she has a good appetite and eats mostly 3 meals per day.  Did have some trouble swallowing but it has improved after esophageal dilation.  Breakfast is usually eggs and bacon, muffin, applesauce or yogurt, coffee and water.  Lunch is TV dinner or soup with water or lemonade.  Supper is meat, potato and vegetable.  Snacks on yogurt, applesauce, cookie. Drinks 1 boost per day.  Reports that she has a bowel movement daily but yesterday did not and this am had bowel movement causing her to strain.  Noticed blood on paper when wiped.  Not taking stool softner or miralax.      Medications: Vit C, Vit D, protonix   Labs: none   Anthropometrics:   Height: 63 inches Weight: 148 lb 12.8 oz  152 lb 9.6 oz on 8/26 BMI: 26  3% weight loss in the last 3 months  Estimated Energy Needs  Kcals: 1675-2000 Protein: 84-100 g Fluid: 1.6 L   NUTRITION DIAGNOSIS: Inadequate oral intake related to cancer, dysphagia as evidenced by 3% weight loss in the last 3 months    INTERVENTION:  Discussed importance of nutrition during treatment and stressed weight maintenance Encouraged 350 calorie shake or higher 1-2 times per day.  Samples of ensure complete, ensure plus, orgain, [employer] 1.4 given along with coupons today. Encouraged soft, moist foods high in protein and calories. Handout provided Contact information provided   MONITORING, EVALUATION, GOAL: weight trends, intake   Next Visit: Wednesday, Dec 14 after radiation  Safwan Tomei B. Zenia Resides, [place], [place] Registered  Dietitian [phone] (mobile)

## 2021-05-17 NOTE — Progress Notes (Signed)
Pharmacist Chemotherapy Monitoring - Initial Assessment    Anticipated start date: 05/24/21   The following has been reviewed per standard work regarding the patient's treatment regimen: The patient's diagnosis, treatment plan and drug doses, and organ/hematologic function Lab orders and baseline tests specific to treatment regimen  The treatment plan start date, drug sequencing, and pre-medications Prior authorization status  Patient's documented medication list, including drug-drug interaction screen and prescriptions for anti-emetics and supportive care specific to the treatment regimen The drug concentrations, fluid compatibility, administration routes, and timing of the medications to be used The patient's access for treatment and lifetime cumulative dose history, if applicable  The patient's medication allergies and previous infusion related reactions, if applicable   Changes made to treatment plan:  treatment plan date  Follow up needed:  dose adjustment of carbo due to missing dose and signing treatment plan   Heather Cooper, [place], 05/17/2021  12:50 PM

## 2021-05-19 ENCOUNTER — Other Ambulatory Visit: Payer: Self-pay | Admitting: Internal Medicine

## 2021-05-21 DIAGNOSIS — Z5111 Encounter for antineoplastic chemotherapy: Secondary | ICD-10-CM | POA: Diagnosis not present

## 2021-05-21 DIAGNOSIS — Z9221 Personal history of antineoplastic chemotherapy: Secondary | ICD-10-CM | POA: Diagnosis not present

## 2021-05-21 DIAGNOSIS — D6481 Anemia due to antineoplastic chemotherapy: Secondary | ICD-10-CM | POA: Insufficient documentation

## 2021-05-21 DIAGNOSIS — Z923 Personal history of irradiation: Secondary | ICD-10-CM | POA: Diagnosis not present

## 2021-05-21 DIAGNOSIS — C771 Secondary and unspecified malignant neoplasm of intrathoracic lymph nodes: Secondary | ICD-10-CM | POA: Diagnosis not present

## 2021-05-21 DIAGNOSIS — Z85118 Personal history of other malignant neoplasm of bronchus and lung: Secondary | ICD-10-CM | POA: Insufficient documentation

## 2021-05-21 DIAGNOSIS — Z510 Encounter for antineoplastic radiation therapy: Secondary | ICD-10-CM | POA: Diagnosis not present

## 2021-05-21 DIAGNOSIS — Z87891 Personal history of nicotine dependence: Secondary | ICD-10-CM | POA: Insufficient documentation

## 2021-05-21 DIAGNOSIS — C154 Malignant neoplasm of middle third of esophagus: Secondary | ICD-10-CM | POA: Insufficient documentation

## 2021-05-24 ENCOUNTER — Other Ambulatory Visit: Payer: Self-pay

## 2021-05-24 ENCOUNTER — Inpatient Hospital Stay: Payer: Medicare PPO

## 2021-05-24 ENCOUNTER — Inpatient Hospital Stay (HOSPITAL_BASED_OUTPATIENT_CLINIC_OR_DEPARTMENT_OTHER): Payer: Medicare PPO | Admitting: Nurse Practitioner

## 2021-05-24 ENCOUNTER — Other Ambulatory Visit: Payer: Self-pay | Admitting: Oncology

## 2021-05-24 ENCOUNTER — Telehealth: Payer: Self-pay | Admitting: *Deleted

## 2021-05-24 ENCOUNTER — Ambulatory Visit: Admission: RE | Admit: 2021-05-24 | Payer: Medicare PPO | Source: Ambulatory Visit

## 2021-05-24 ENCOUNTER — Encounter: Payer: Self-pay | Admitting: Nurse Practitioner

## 2021-05-24 VITALS — BP 137/68 | HR 106 | Temp 97.5°F | Ht 63.0 in | Wt 148.5 lb

## 2021-05-24 DIAGNOSIS — Z923 Personal history of irradiation: Secondary | ICD-10-CM | POA: Diagnosis not present

## 2021-05-24 DIAGNOSIS — Z9221 Personal history of antineoplastic chemotherapy: Secondary | ICD-10-CM | POA: Diagnosis not present

## 2021-05-24 DIAGNOSIS — D490 Neoplasm of unspecified behavior of digestive system: Secondary | ICD-10-CM

## 2021-05-24 DIAGNOSIS — Z85118 Personal history of other malignant neoplasm of bronchus and lung: Secondary | ICD-10-CM | POA: Diagnosis not present

## 2021-05-24 DIAGNOSIS — Z5111 Encounter for antineoplastic chemotherapy: Secondary | ICD-10-CM | POA: Diagnosis not present

## 2021-05-24 DIAGNOSIS — C154 Malignant neoplasm of middle third of esophagus: Secondary | ICD-10-CM | POA: Diagnosis not present

## 2021-05-24 DIAGNOSIS — Z87891 Personal history of nicotine dependence: Secondary | ICD-10-CM | POA: Diagnosis not present

## 2021-05-24 DIAGNOSIS — Z510 Encounter for antineoplastic radiation therapy: Secondary | ICD-10-CM | POA: Diagnosis not present

## 2021-05-24 DIAGNOSIS — D6481 Anemia due to antineoplastic chemotherapy: Secondary | ICD-10-CM | POA: Diagnosis not present

## 2021-05-24 DIAGNOSIS — C771 Secondary and unspecified malignant neoplasm of intrathoracic lymph nodes: Secondary | ICD-10-CM | POA: Diagnosis not present

## 2021-05-24 LAB — CBC WITH DIFFERENTIAL/PLATELET
Abs Immature Granulocytes: 0.06 10*3/uL (ref 0.00–0.07)
Basophils Absolute: 0 10*3/uL (ref 0.0–0.1)
Basophils Relative: 0 %
Eosinophils Absolute: 0 10*3/uL (ref 0.0–0.5)
Eosinophils Relative: 0 %
HCT: 39.5 % (ref 36.0–46.0)
Hemoglobin: 13.6 g/dL (ref 12.0–15.0)
Immature Granulocytes: 0 %
Lymphocytes Relative: 8 %
Lymphs Abs: 1.2 10*3/uL (ref 0.7–4.0)
MCH: 30.5 pg (ref 26.0–34.0)
MCHC: 34.4 g/dL (ref 30.0–36.0)
MCV: 88.6 fL (ref 80.0–100.0)
Monocytes Absolute: 0.7 10*3/uL (ref 0.1–1.0)
Monocytes Relative: 5 %
Neutro Abs: 14 10*3/uL — ABNORMAL HIGH (ref 1.7–7.7)
Neutrophils Relative %: 87 %
Platelets: 264 10*3/uL (ref 150–400)
RBC: 4.46 MIL/uL (ref 3.87–5.11)
RDW: 12.6 % (ref 11.5–15.5)
WBC: 16.1 10*3/uL — ABNORMAL HIGH (ref 4.0–10.5)
nRBC: 0 % (ref 0.0–0.2)

## 2021-05-24 LAB — COMPREHENSIVE METABOLIC PANEL
ALT: 17 U/L (ref 0–44)
AST: 22 U/L (ref 15–41)
Albumin: 4.5 g/dL (ref 3.5–5.0)
Alkaline Phosphatase: 72 U/L (ref 38–126)
Anion gap: 10 (ref 5–15)
BUN: 23 mg/dL (ref 8–23)
CO2: 27 mmol/L (ref 22–32)
Calcium: 10 mg/dL (ref 8.9–10.3)
Chloride: 98 mmol/L (ref 98–111)
Creatinine, Ser: 1 mg/dL (ref 0.44–1.00)
GFR, Estimated: 54 mL/min — ABNORMAL LOW (ref >60)
Glucose, Bld: 175 mg/dL — ABNORMAL HIGH (ref 70–99)
Potassium: 4.1 mmol/L (ref 3.5–5.1)
Sodium: 135 mmol/L (ref 135–145)
Total Bilirubin: 0.9 mg/dL (ref 0.3–1.2)
Total Protein: 8.7 g/dL — ABNORMAL HIGH (ref 6.5–8.1)

## 2021-05-24 MED ORDER — PROCHLORPERAZINE MALEATE 10 MG PO TABS: 10.0000 mg | ORAL_TABLET | Freq: Four times a day (QID) | ORAL | 1 refills | Status: DC | PRN

## 2021-05-24 MED ORDER — PALONOSETRON HCL INJECTION 0.25 MG/5ML
0.2500 mg | Freq: Once | INTRAVENOUS | Status: AC
  Administered 2021-05-24: 0.25 mg via INTRAVENOUS

## 2021-05-24 MED ORDER — LORAZEPAM 0.5 MG PO TABS: 0.5000 mg | ORAL_TABLET | Freq: Four times a day (QID) | ORAL | 0 refills | Status: DC | PRN

## 2021-05-24 MED ORDER — ONDANSETRON HCL 8 MG PO TABS: 8.0000 mg | ORAL_TABLET | Freq: Two times a day (BID) | ORAL | 1 refills | Status: DC | PRN

## 2021-05-24 MED ORDER — SODIUM CHLORIDE 0.9 % IV SOLN
Freq: Once | INTRAVENOUS | Status: AC
  Filled 2021-05-24: qty 250

## 2021-05-24 MED ORDER — DEXAMETHASONE 4 MG PO TABS: 8.0000 mg | ORAL_TABLET | Freq: Every day | ORAL | 1 refills | Status: DC

## 2021-05-24 MED ORDER — SODIUM CHLORIDE 0.9 % IV SOLN
132.8000 mg | Freq: Once | INTRAVENOUS | Status: AC
  Administered 2021-05-24: 130 mg via INTRAVENOUS
  Filled 2021-05-24: qty 13

## 2021-05-24 MED ORDER — SODIUM CHLORIDE 0.9 % IV SOLN
10.0000 mg | Freq: Once | INTRAVENOUS | Status: AC
  Administered 2021-05-24: 10 mg via INTRAVENOUS
  Filled 2021-05-24: qty 10

## 2021-05-24 MED ORDER — PACLITAXEL PROTEIN-BOUND CHEMO INJECTION 100 MG
100.0000 mg/m2 | Freq: Once | INTRAVENOUS | Status: AC
  Administered 2021-05-24: 175 mg via INTRAVENOUS
  Filled 2021-05-24: qty 35

## 2021-05-24 NOTE — Progress Notes (Signed)
Ok per Beckey Rutter NP to tx with HR of 106 .

## 2021-05-24 NOTE — Telephone Encounter (Signed)
Catherine Dales Key: [id number] - PA Case ID: [id number] - Rx #: [id number]  PA- submitted for lorazepam via covermymeds   Status: PA Approved today (PA Case: [id number], Status: Approved, Coverage Starts on: 06/13/2020 12:00:00 AM, Coverage Ends on: 06/12/2022 12:00:00 AM. Questions? Contact [phone].)  I contacted [facility] and informed pharmacist that mediation has been approved.

## 2021-05-24 NOTE — Progress Notes (Signed)
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  Starting radiation and chemotherapy from April 14, 2014 Patient was started on carboplatinum andTaxol Herve were developed an allergic reaction to Taxol so would be changed to Abraxane 3.patient has finished 6 cycles of weekly chemotherapy with carboplatinum  and radiation therapy(May 28, 2014) 4.started on  NIVOLULAMAB because of persistent disease July 02, 2014. 5.  NIVOLULAMAB was discontinued because of persistent diarrhea in July of 2016.  August of 2016 CT scan was stable so no further chemotherapy  # AUG 25th PET- STABLE RUL MASS [radiation fibrosis; <1cm ? Mediastinal recurrence];   # AAA/ 3.3x 3.9 Stable [Dr.Schnier] -----------------------------------------------------   DIAGNOSIS: Lung cancer  STAGE:  IV-NED       ;GOALS: control  CURRENT/MOST RECENT THERAPY : surveillance    History of lung cancer  Malignant neoplasm of right upper lobe of lung ([place])  08/07/2019 Initial Diagnosis   Malignant neoplasm of right upper lobe of lung ([facility])    INTERVAL HISTORY: Ambulating with a cane.  Accompanied by her daughter.  Heather Cooper 87 y.o.  female pleasant patient newly  diagnosed with moderately differentiated SCC of midesophagus, previously treated for probable stage IV adenocarcinoma of the right lung, who returns to clinic for consideration of concurrent chemo-radiation. She feels at baseline and denies complaints.    Review of Systems  Constitutional:  Negative for chills, diaphoresis, fever, malaise/fatigue and weight loss.  HENT:  Negative for nosebleeds and sore throat.   Eyes:  Negative for double vision.  Respiratory:  Positive for cough. Negative for hemoptysis, sputum production, shortness of breath and wheezing.   Cardiovascular:  Negative for chest pain, palpitations, orthopnea and leg swelling.  Gastrointestinal:  Negative for abdominal pain, blood in stool, constipation, diarrhea, heartburn, melena, nausea and vomiting.  Genitourinary:  Negative for dysuria, frequency and urgency.  Musculoskeletal:  Negative for back pain and joint pain.  Skin: Negative.  Negative for itching and rash.  Neurological:  Negative for dizziness, tingling, focal weakness, weakness and headaches.  Endo/Heme/Allergies:  Does not bruise/bleed easily.  Psychiatric/Behavioral:  Negative for depression. The patient is not nervous/anxious and does not have insomnia.     PAST MEDICAL HISTORY :  Past Medical History:  Diagnosis Date   Chicken pox    Esophageal cancer ([place])    Hypercalcemia    familial hypocalciuric hypercalcemia   Hypercholesterolemia    Hypertension    Lung cancer ([place])    Osteoporosis    Thyroid disease     PAST SURGICAL HISTORY :   Past Surgical History:  Procedure Laterality Date   ABDOMINAL HYSTERECTOMY     partial   CHOLECYSTECTOMY     COLONOSCOPY WITH PROPOFOL N/A 04/17/2017   Procedure: COLONOSCOPY WITH PROPOFOL;  Surgeon: Manya Silvas, MD;  Location: [facility] ENDOSCOPY;  Service: Endoscopy;  Laterality: N/A;   ESOPHAGOGASTRODUODENOSCOPY (EGD) WITH PROPOFOL N/A 04/13/2021   Procedure: ESOPHAGOGASTRODUODENOSCOPY (EGD) WITH PROPOFOL;   Surgeon: [name], MD;  Location: [facility] ENDOSCOPY;  Service: Endoscopy;  Laterality: N/A;   EUS N/A 04/22/2021   Procedure: FULL UPPER ENDOSCOPIC ULTRASOUND (EUS) RADIAL;  Surgeon: Jola Schmidt, MD;  Location: [facility] ENDOSCOPY;  Service: Endoscopy;  Laterality: N/A;  [facility] needed   transvaginal hysterectomy  04/18/05   with anterior colporrhaphy    FAMILY HISTORY :   Family History  Problem Relation Age of Onset   Stroke Mother    Hypertension Mother    Prostate cancer Father    Cancer Father        prostate   Heart disease Brother        s/p CABG   Colon cancer Neg Hx     SOCIAL HISTORY:   Social History   Tobacco Use   Smoking status: Former    Types: Cigarettes    Quit date: 06/13/1997    Years since quitting: 23.9   Smokeless tobacco: Never  Vaping Use   Vaping Use: Never used  Substance Use Topics   Alcohol use: No    Alcohol/week: 0.0 standard drinks   Drug use: No    ALLERGIES:  is allergic to paclitaxel.  MEDICATIONS:  Current Outpatient Medications  Medication Sig Dispense Refill   Ascorbic Acid (VITAMIN C) 1000 MG tablet Take 1,000 mg by mouth daily.     cholecalciferol (VITAMIN D3) 25 MCG (1000 UNIT) tablet Take 1,000 Units by mouth daily.     fluticasone (FLONASE) 50 MCG/ACT nasal spray Place 2 sprays into both nostrils daily. 16 g 3   levocetirizine (XYZAL) 5 MG tablet Take 1 tablet (5 mg total) by mouth every evening. 90 tablet 2   olmesartan-hydrochlorothiazide (BENICAR HCT) 20-12.5 MG tablet TAKE 1 TABLET BY MOUTH ONCE DAILY 90 tablet 1   pantoprazole (PROTONIX) 40 MG tablet TAKE 1 TABLET BY MOUTH ONCE DAILY 90 tablet 3   No current facility-administered medications for this visit.   Facility-Administered Medications Ordered in Other Visits  Medication Dose Route Frequency Provider Last Rate Last Admin   sodium chloride 0.9 % 1,000 mL with potassium chloride 20 mEq, magnesium sulfate 2 g infusion   Intravenous Continuous Choksi, Delorise Shiner, MD    Stopped at 12/04/14 1300    PHYSICAL EXAMINATION: ECOG PERFORMANCE STATUS: 0 - Asymptomatic  BP 137/68 (BP Location: Left Arm, Patient Position: Sitting, Cuff Size: Normal)   Pulse (!) 131   Temp (!) 97.5 F (36.4 C) (Oral)   Ht 5' 3"  (1.6 m)   Wt 148 lb 8 oz (67.4 kg)   SpO2 98%   BMI 26.31 kg/m   Filed Weights   05/24/21 0854  Weight: 148 lb 8 oz (67.4 kg)    Physical Exam HENT:     Head: Normocephalic and atraumatic.     Mouth/Throat:     Pharynx: No oropharyngeal exudate.  Eyes:     Pupils: Pupils are equal, round, and reactive to light.  Cardiovascular:     Rate and Rhythm: Normal rate and regular rhythm.  Pulmonary:     Effort: Pulmonary effort is normal. No respiratory distress.     Breath sounds: Normal breath sounds. No wheezing.  Abdominal:     General: Bowel sounds are normal. There is no distension.     Palpations: Abdomen is soft. There is no mass.  Tenderness: There is no abdominal tenderness. There is no guarding or rebound.  Musculoskeletal:        General: No tenderness. Normal range of motion.     Cervical back: Normal range of motion and neck supple.  Skin:    General: Skin is warm.  Neurological:     Mental Status: She is alert and oriented to person, place, and time.  Psychiatric:        Mood and Affect: Affect normal.     LABORATORY DATA:  I have reviewed the data as listed    Component Value Date/Time   NA 135 05/24/2021 0829   NA 130 (L) 10/09/2014 0846   K 4.1 05/24/2021 0829   K 4.1 10/09/2014 0846   CL 98 05/24/2021 0829   CL 98 (L) 10/09/2014 0846   CO2 27 05/24/2021 0829   CO2 25 10/09/2014 0846   GLUCOSE 175 (H) 05/24/2021 0829   GLUCOSE 161 (H) 10/09/2014 0846   BUN 23 05/24/2021 0829   BUN 14 10/09/2014 0846   CREATININE 1.00 05/24/2021 0829   CREATININE 0.70 10/09/2014 0846   CALCIUM 10.0 05/24/2021 0829   CALCIUM 9.1 10/09/2014 0846   PROT 8.7 (H) 05/24/2021 0829   PROT 7.3 10/09/2014 0846   ALBUMIN 4.5  05/24/2021 0829   ALBUMIN 3.6 10/09/2014 0846   AST 22 05/24/2021 0829   AST 16 10/09/2014 0846   ALT 17 05/24/2021 0829   ALT 13 (L) 10/09/2014 0846   ALKPHOS 72 05/24/2021 0829   ALKPHOS 70 10/09/2014 0846   BILITOT 0.9 05/24/2021 0829   BILITOT 1.0 10/09/2014 0846   GFRNONAA 54 (L) 05/24/2021 0829   GFRNONAA >60 10/09/2014 0846   GFRAA >60 02/05/2020 1004   GFRAA >60 10/09/2014 0846    No results found for: SPEP, UPEP  Lab Results  Component Value Date   WBC 16.1 (H) 05/24/2021   NEUTROABS 14.0 (H) 05/24/2021   HGB 13.6 05/24/2021   HCT 39.5 05/24/2021   MCV 88.6 05/24/2021   PLT 264 05/24/2021      Chemistry      Component Value Date/Time   NA 135 05/24/2021 0829   NA 130 (L) 10/09/2014 0846   K 4.1 05/24/2021 0829   K 4.1 10/09/2014 0846   CL 98 05/24/2021 0829   CL 98 (L) 10/09/2014 0846   CO2 27 05/24/2021 0829   CO2 25 10/09/2014 0846   BUN 23 05/24/2021 0829   BUN 14 10/09/2014 0846   CREATININE 1.00 05/24/2021 0829   CREATININE 0.70 10/09/2014 0846   GLU 111 03/18/2014 1048      Component Value Date/Time   CALCIUM 10.0 05/24/2021 0829   CALCIUM 9.1 10/09/2014 0846   ALKPHOS 72 05/24/2021 0829   ALKPHOS 70 10/09/2014 0846   AST 22 05/24/2021 0829   AST 16 10/09/2014 0846   ALT 17 05/24/2021 0829   ALT 13 (L) 10/09/2014 0846   BILITOT 0.9 05/24/2021 0829   BILITOT 1.0 10/09/2014 0846       RADIOGRAPHIC STUDIES: I have personally reviewed the radiological images as listed and agreed with the findings in the report. No results found.    ASSESSMENT & PLAN:  No problem-specific Assessment & Plan notes found for this encounter.  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  Moderate metabolic activity associated the  small subcarinal lymph  node.  See below for incidental findings  # Labs reviewed today and acceptable for initiation of carbo-abraxane with concurrent radiation. She had pervious reaction/anaphylaxis to taxol. Reviewed treatment plan of weekly carbo-abraxane and concurrent radiation for approximately 4-6 weeks. If she has progressive disease, she would be a candidate for immunotherapy.   # Anemia- hemoglobin is stable. Monitor in setting of chemotherapy.   # Right upper lobe lung cancer adeno ca- Stage IV [contralateral left lower lobe lung nodule] status post chemoradiation-s/p Nivo [aslt 2015]. NOV 16th-PET-no evidence of recurrent lung cancer.   # FBG-  Monitor closely on chemotherapy/steroids   # DISPOSITION:  # Proceed with carbo-abraxane today # RTC next week for labs, MD/NP, carbo-abraxane  No orders of the defined types were placed in this encounter.  All questions were answered. The patient knows to call the clinic with any problems, questions or concerns.    Verlon Au, NP 05/24/2021

## 2021-05-24 NOTE — Patient Instructions (Addendum)
[facility] CANCER CTR AT [place]  Discharge Instructions: Thank you for choosing [facility] to provide your oncology and hematology care.  If you have a lab appointment with the [facility], please go directly to the [facility] and check in at the registration area.  Wear comfortable clothing and clothing appropriate for easy access to any Portacath or PICC line.   We strive to give you quality time with your provider. You may need to reschedule your appointment if you arrive late (15 or more minutes).  Arriving late affects you and other patients whose appointments are after yours.  Also, if you miss three or more appointments without notifying the office, you may be dismissed from the clinic at the provider's discretion.      For prescription refill requests, have your pharmacy contact our office and allow 72 hours for refills to be completed.    Today you received the following chemotherapy and/or immunotherapy agents : Abraxane / Carboplatin      Nanoparticle Albumin-Bound Paclitaxel injection What is this medication? NANOPARTICLE ALBUMIN-BOUND PACLITAXEL (Na no PAHR ti kuhl  al BYOO muhn-bound  PAK li TAX el) is a chemotherapy drug. It targets fast dividing cells, like cancer cells, and causes these cells to die. This medicine is used to treat advanced breast cancer, lung cancer, and pancreatic cancer. This medicine may be used for other purposes; ask your health care provider or pharmacist if you have questions. COMMON BRAND NAME(S): Abraxane What should I tell my care team before I take this medication? They need to know if you have any of these conditions: kidney disease liver disease low blood counts, like low white cell, platelet, or red cell counts lung or breathing disease, like asthma tingling of the fingers or toes, or other nerve disorder an unusual or allergic reaction to paclitaxel, albumin, other chemotherapy, other medicines, foods,  dyes, or preservatives pregnant or trying to get pregnant breast-feeding How should I use this medication? This drug is given as an infusion into a vein. It is administered in a hospital or clinic by a specially trained health care professional. Talk to your pediatrician regarding the use of this medicine in children. Special care may be needed. Overdosage: If you think you have taken too much of this medicine contact a poison control center or emergency room at once. NOTE: This medicine is only for you. Do not share this medicine with others. What if I miss a dose? It is important not to miss your dose. Call your doctor or health care professional if you are unable to keep an appointment. What may interact with this medication? This medicine may interact with the following medications: antiviral medicines for hepatitis, HIV or AIDS certain antibiotics like erythromycin and clarithromycin certain medicines for fungal infections like ketoconazole and itraconazole certain medicines for seizures like carbamazepine, phenobarbital, phenytoin gemfibrozil nefazodone rifampin St. John's wort This list may not describe all possible interactions. Give your health care provider a list of all the medicines, herbs, non-prescription drugs, or dietary supplements you use. Also tell them if you smoke, drink alcohol, or use illegal drugs. Some items may interact with your medicine. What should I watch for while using this medication? Your condition will be monitored carefully while you are receiving this medicine. You will need important blood work done while you are taking this medicine. This medicine can cause serious allergic reactions. If you experience allergic reactions like skin rash, itching or hives, swelling of the face, lips, or  tongue, tell your doctor or health care professional right away. In some cases, you may be given additional medicines to help with side effects. Follow all directions for  their use. This drug may make you feel generally unwell. This is not uncommon, as chemotherapy can affect healthy cells as well as cancer cells. Report any side effects. Continue your course of treatment even though you feel ill unless your doctor tells you to stop. Call your doctor or health care professional for advice if you get a fever, chills or sore throat, or other symptoms of a cold or flu. Do not treat yourself. This drug decreases your body's ability to fight infections. Try to avoid being around people who are sick. This medicine may increase your risk to bruise or bleed. Call your doctor or health care professional if you notice any unusual bleeding. Be careful brushing and flossing your teeth or using a toothpick because you may get an infection or bleed more easily. If you have any dental work done, tell your dentist you are receiving this medicine. Avoid taking products that contain aspirin, acetaminophen, ibuprofen, naproxen, or ketoprofen unless instructed by your doctor. These medicines may hide a fever. Do not become pregnant while taking this medicine or for 6 months after stopping it. Women should inform their doctor if they wish to become pregnant or think they might be pregnant. Men should not father a child while taking this medicine or for 3 months after stopping it. There is a potential for serious side effects to an unborn child. Talk to your health care professional or pharmacist for more information. Do not breast-feed an infant while taking this medicine or for 2 weeks after stopping it. This medicine may interfere with the ability to get pregnant or to father a child. You should talk to your doctor or health care professional if you are concerned about your fertility. What side effects may I notice from receiving this medication? Side effects that you should report to your doctor or health care professional as soon as possible: allergic reactions like skin rash, itching or  hives, swelling of the face, lips, or tongue breathing problems changes in vision fast, irregular heartbeat low blood pressure mouth sores pain, tingling, numbness in the hands or feet signs of decreased platelets or bleeding - bruising, pinpoint red spots on the skin, black, tarry stools, blood in the urine signs of decreased red blood cells - unusually weak or tired, feeling faint or lightheaded, falls signs of infection - fever or chills, cough, sore throat, pain or difficulty passing urine signs and symptoms of liver injury like dark yellow or brown urine; general ill feeling or flu-like symptoms; light-colored stools; loss of appetite; nausea; right upper belly pain; unusually weak or tired; yellowing of the eyes or skin swelling of the ankles, feet, hands unusually slow heartbeat Side effects that usually do not require medical attention (report to your doctor or health care professional if they continue or are bothersome): diarrhea hair loss loss of appetite nausea, vomiting tiredness This list may not describe all possible side effects. Call your doctor for medical advice about side effects. You may report side effects to FDA at 1-800-FDA-1088. Where should I keep my medication? This drug is given in a hospital or clinic and will not be stored at home. NOTE: This sheet is a summary. It may not cover all possible information. If you have questions about this medicine, talk to your doctor, pharmacist, or health care provider.  2022 Elsevier/Gold  Standard (2017-01-31 00:00:00)    Carboplatin injection What is this medication? CARBOPLATIN (KAR boe pla tin) is a chemotherapy drug. It targets fast dividing cells, like cancer cells, and causes these cells to die. This medicine is used to treat ovarian cancer and many other cancers. This medicine may be used for other purposes; ask your health care provider or pharmacist if you have questions. COMMON BRAND NAME(S): Paraplatin What  should I tell my care team before I take this medication? They need to know if you have any of these conditions: blood disorders hearing problems kidney disease recent or ongoing radiation therapy an unusual or allergic reaction to carboplatin, cisplatin, other chemotherapy, other medicines, foods, dyes, or preservatives pregnant or trying to get pregnant breast-feeding How should I use this medication? This drug is usually given as an infusion into a vein. It is administered in a hospital or clinic by a specially trained health care professional. Talk to your pediatrician regarding the use of this medicine in children. Special care may be needed. Overdosage: If you think you have taken too much of this medicine contact a poison control center or emergency room at once. NOTE: This medicine is only for you. Do not share this medicine with others. What if I miss a dose? It is important not to miss a dose. Call your doctor or health care professional if you are unable to keep an appointment. What may interact with this medication? medicines for seizures medicines to increase blood counts like filgrastim, pegfilgrastim, sargramostim some antibiotics like amikacin, gentamicin, neomycin, streptomycin, tobramycin vaccines Talk to your doctor or health care professional before taking any of these medicines: acetaminophen aspirin ibuprofen ketoprofen naproxen This list may not describe all possible interactions. Give your health care provider a list of all the medicines, herbs, non-prescription drugs, or dietary supplements you use. Also tell them if you smoke, drink alcohol, or use illegal drugs. Some items may interact with your medicine. What should I watch for while using this medication? Your condition will be monitored carefully while you are receiving this medicine. You will need important blood work done while you are taking this medicine. This drug may make you feel generally unwell.  This is not uncommon, as chemotherapy can affect healthy cells as well as cancer cells. Report any side effects. Continue your course of treatment even though you feel ill unless your doctor tells you to stop. In some cases, you may be given additional medicines to help with side effects. Follow all directions for their use. Call your doctor or health care professional for advice if you get a fever, chills or sore throat, or other symptoms of a cold or flu. Do not treat yourself. This drug decreases your body's ability to fight infections. Try to avoid being around people who are sick. This medicine may increase your risk to bruise or bleed. Call your doctor or health care professional if you notice any unusual bleeding. Be careful brushing and flossing your teeth or using a toothpick because you may get an infection or bleed more easily. If you have any dental work done, tell your dentist you are receiving this medicine. Avoid taking products that contain aspirin, acetaminophen, ibuprofen, naproxen, or ketoprofen unless instructed by your doctor. These medicines may hide a fever. Do not become pregnant while taking this medicine. Women should inform their doctor if they wish to become pregnant or think they might be pregnant. There is a potential for serious side effects to an unborn child. Talk  to your health care professional or pharmacist for more information. Do not breast-feed an infant while taking this medicine. What side effects may I notice from receiving this medication? Side effects that you should report to your doctor or health care professional as soon as possible: allergic reactions like skin rash, itching or hives, swelling of the face, lips, or tongue signs of infection - fever or chills, cough, sore throat, pain or difficulty passing urine signs of decreased platelets or bleeding - bruising, pinpoint red spots on the skin, black, tarry stools, nosebleeds signs of decreased red blood  cells - unusually weak or tired, fainting spells, lightheadedness breathing problems changes in hearing changes in vision chest pain high blood pressure low blood counts - This drug may decrease the number of white blood cells, red blood cells and platelets. You may be at increased risk for infections and bleeding. nausea and vomiting pain, swelling, redness or irritation at the injection site pain, tingling, numbness in the hands or feet problems with balance, talking, walking trouble passing urine or change in the amount of urine Side effects that usually do not require medical attention (report to your doctor or health care professional if they continue or are bothersome): hair loss loss of appetite metallic taste in the mouth or changes in taste This list may not describe all possible side effects. Call your doctor for medical advice about side effects. You may report side effects to FDA at 1-800-FDA-1088. Where should I keep my medication? This drug is given in a hospital or clinic and will not be stored at home. NOTE: This sheet is a summary. It may not cover all possible information. If you have questions about this medicine, talk to your doctor, pharmacist, or health care provider.  2022 Elsevier/Gold Standard (2007-11-07 00:00:00)    To help prevent nausea and vomiting after your treatment, we encourage you to take your nausea medication as directed.  BELOW ARE SYMPTOMS THAT SHOULD BE REPORTED IMMEDIATELY: *FEVER GREATER THAN 100.4 F (38 C) OR HIGHER *CHILLS OR SWEATING *NAUSEA AND VOMITING THAT IS NOT CONTROLLED WITH YOUR NAUSEA MEDICATION *UNUSUAL SHORTNESS OF BREATH *UNUSUAL BRUISING OR BLEEDING *URINARY PROBLEMS (pain or burning when urinating, or frequent urination) *BOWEL PROBLEMS (unusual diarrhea, constipation, pain near the anus) TENDERNESS IN MOUTH AND THROAT WITH OR WITHOUT PRESENCE OF ULCERS (sore throat, sores in mouth, or a toothache) UNUSUAL RASH, SWELLING  OR PAIN  UNUSUAL VAGINAL DISCHARGE OR ITCHING   Items with * indicate a potential emergency and should be followed up as soon as possible or go to the Emergency Department if any problems should occur.  Please show the CHEMOTHERAPY ALERT CARD or IMMUNOTHERAPY ALERT CARD at check-in to the Emergency Department and triage nurse.  Should you have questions after your visit or need to cancel or reschedule your appointment, please contact [facility] CANCER [place] AT [place]  [phone] and follow the prompts.  Office hours are 8:00 a.m. to 4:30 p.m. Monday - Friday. Please note that voicemails left after 4:00 p.m. may not be returned until the following business day.  We are closed weekends and major holidays. You have access to a nurse at all times for urgent questions. Please call the main number to the clinic [phone] and follow the prompts.  For any non-urgent questions, you may also contact your provider using MyChart. We now offer e-Visits for anyone 91 and older to request care online for non-urgent symptoms. For details visit [email or web address].   Also download the MyChart app!  Go to the app store, search "MyChart", open the app, select [facility], and log in with your MyChart username and password.  Due to Covid, a mask is required upon entering the hospital/clinic. If you do not have a mask, one will be given to you upon arrival. For doctor visits, patients may have 1 support person aged 7 or older with them. For treatment visits, patients cannot have anyone with them due to current Covid guidelines and our immunocompromised population.

## 2021-05-24 NOTE — Progress Notes (Signed)
Pt doesn't have nausea meds at home.

## 2021-05-25 ENCOUNTER — Ambulatory Visit
Admission: RE | Admit: 2021-05-25 | Discharge: 2021-05-25 | Disposition: A | Discharge status: Home / Self Care | Payer: Medicare PPO | Source: Ambulatory Visit | Attending: Radiation Oncology | Admitting: Radiation Oncology

## 2021-05-25 ENCOUNTER — Ambulatory Visit (INDEPENDENT_AMBULATORY_CARE_PROVIDER_SITE_OTHER): Payer: Medicare PPO | Admitting: Internal Medicine

## 2021-05-25 ENCOUNTER — Ambulatory Visit: Payer: Medicare PPO | Admitting: Internal Medicine

## 2021-05-25 ENCOUNTER — Encounter: Payer: Self-pay | Admitting: Internal Medicine

## 2021-05-25 ENCOUNTER — Other Ambulatory Visit: Payer: Self-pay

## 2021-05-25 ENCOUNTER — Telehealth: Payer: Self-pay | Admitting: *Deleted

## 2021-05-25 DIAGNOSIS — Z87891 Personal history of nicotine dependence: Secondary | ICD-10-CM | POA: Diagnosis not present

## 2021-05-25 DIAGNOSIS — I10 Essential (primary) hypertension: Secondary | ICD-10-CM

## 2021-05-25 DIAGNOSIS — D6481 Anemia due to antineoplastic chemotherapy: Secondary | ICD-10-CM | POA: Diagnosis not present

## 2021-05-25 DIAGNOSIS — Z9221 Personal history of antineoplastic chemotherapy: Secondary | ICD-10-CM | POA: Diagnosis not present

## 2021-05-25 DIAGNOSIS — Z85118 Personal history of other malignant neoplasm of bronchus and lung: Secondary | ICD-10-CM | POA: Diagnosis not present

## 2021-05-25 DIAGNOSIS — Z0000 Encounter for general adult medical examination without abnormal findings: Secondary | ICD-10-CM

## 2021-05-25 DIAGNOSIS — K219 Gastro-esophageal reflux disease without esophagitis: Secondary | ICD-10-CM | POA: Diagnosis not present

## 2021-05-25 DIAGNOSIS — E1165 Type 2 diabetes mellitus with hyperglycemia: Secondary | ICD-10-CM

## 2021-05-25 DIAGNOSIS — Z5111 Encounter for antineoplastic chemotherapy: Secondary | ICD-10-CM | POA: Diagnosis not present

## 2021-05-25 DIAGNOSIS — Z510 Encounter for antineoplastic radiation therapy: Secondary | ICD-10-CM | POA: Diagnosis not present

## 2021-05-25 DIAGNOSIS — D649 Anemia, unspecified: Secondary | ICD-10-CM

## 2021-05-25 DIAGNOSIS — C154 Malignant neoplasm of middle third of esophagus: Secondary | ICD-10-CM | POA: Diagnosis not present

## 2021-05-25 DIAGNOSIS — R197 Diarrhea, unspecified: Secondary | ICD-10-CM

## 2021-05-25 DIAGNOSIS — E7800 Pure hypercholesterolemia, unspecified: Secondary | ICD-10-CM | POA: Diagnosis not present

## 2021-05-25 DIAGNOSIS — I714 Abdominal aortic aneurysm, without rupture, unspecified: Secondary | ICD-10-CM | POA: Diagnosis not present

## 2021-05-25 DIAGNOSIS — C771 Secondary and unspecified malignant neoplasm of intrathoracic lymph nodes: Secondary | ICD-10-CM | POA: Diagnosis not present

## 2021-05-25 DIAGNOSIS — C159 Malignant neoplasm of esophagus, unspecified: Secondary | ICD-10-CM | POA: Diagnosis not present

## 2021-05-25 DIAGNOSIS — Z923 Personal history of irradiation: Secondary | ICD-10-CM | POA: Diagnosis not present

## 2021-05-25 NOTE — Telephone Encounter (Signed)
Patient was a new start carbo/abraxane on 05/24/21.  Call attempt to patient's home # and cell phone # to check on patient post chemo. Left detailed vm that we were calling to check on patient after her chemotherapy. Also sending mychart msg to patient.

## 2021-05-25 NOTE — Telephone Encounter (Signed)
[employer]mla form rcvd 05/24/21 FMLA completed on behalf of pt's daughter. Pending provider's signature. Will fax completed form to [facility] once form has been signed. I will contact daughter once the form has been faxed.

## 2021-05-25 NOTE — Progress Notes (Signed)
Patient ID: Heather Cooper, female   DOB: 1931/02/01, 87 y.o.   MRN: [id number]   Subjective:    Patient ID: Heather Cooper, female    DOB: [birth year], 87 y.o.   MRN: [id number]  This visit occurred during the SARS-CoV-2 public health emergency.  Safety protocols were in place, including screening questions prior to the visit, additional usage of staff PPE, and extensive cleaning of exam room while observing appropriate contact time as indicated for disinfecting solutions.   Patient here for her physical.   .   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  Receiving chemo and XRT.  Is doing well.  Eating.  No nausea or vomiting.  Had diarrhea last night.  Minimal this am.  Has not been a persistent problem for her. No abdominal pain.  No chest pain or sob reported.  Breathing stable.  No increased cough or congestion.    Past Medical History:  Diagnosis Date   Chemotherapy induced nausea and vomiting    Chicken pox    Esophageal cancer (HCC)    Hypercalcemia    familial hypocalciuric hypercalcemia   Hypercholesterolemia    Hypertension    Lung cancer ([place])    Osteoporosis    Thyroid disease    Past Surgical History:  Procedure Laterality Date   ABDOMINAL HYSTERECTOMY     partial   CHOLECYSTECTOMY     COLONOSCOPY WITH PROPOFOL N/A 04/17/2017   Procedure: COLONOSCOPY WITH PROPOFOL;  Surgeon: Manya Silvas, MD;  Location: [facility] ENDOSCOPY;  Service: Endoscopy;  Laterality: N/A;   ESOPHAGOGASTRODUODENOSCOPY (EGD) WITH PROPOFOL N/A 04/13/2021   Procedure: ESOPHAGOGASTRODUODENOSCOPY (EGD) WITH PROPOFOL;  Surgeon: [name], MD;  Location: [facility] ENDOSCOPY;  Service: Endoscopy;  Laterality: N/A;   EUS N/A 04/22/2021   Procedure: FULL UPPER ENDOSCOPIC ULTRASOUND (EUS) RADIAL;  Surgeon: Jola Schmidt, MD;  Location: [facility] ENDOSCOPY;  Service: Endoscopy;  Laterality: N/A;  [facility] needed   transvaginal hysterectomy  04/18/05   with anterior colporrhaphy   Family  History  Problem Relation Age of Onset   Stroke Mother    Hypertension Mother    Prostate cancer Father    Cancer Father        prostate   Heart disease Brother        s/p CABG   Colon cancer Neg Hx    Social History   Socioeconomic History   Marital status: Widowed    Spouse name: Not on file   Number of children: 3   Years of education: Not on file   Highest education level: Not on file  Occupational History   Not on file  Tobacco Use   Smoking status: Former    Types: Cigarettes    Quit date: 06/13/1997    Years since quitting: 23.9   Smokeless tobacco: Never  Vaping Use   Vaping Use: Never used  Substance and Sexual Activity   Alcohol use: No    Alcohol/week: 0.0 standard drinks   Drug use: No   Sexual activity: Not on file  Other Topics Concern   Not on file  Social History Narrative   Lost her husband November 2019   Social Determinants of Health   Financial Resource Strain: Low Risk    Difficulty of Paying Living Expenses: Not hard at all  Food Insecurity: No Food Insecurity   Worried About Charity fundraiser in the Last Year: Never true   [place] in the Last Year: Never true  Transportation Needs: No Data processing manager (Medical): No   Lack of Transportation (Non-Medical): No  Physical Activity: Unknown   Days of Exercise per Week: 0 days   Minutes of Exercise per Session: Not on file  Stress: No Stress Concern Present   Feeling of Stress : Not at all  Social Connections: Moderately Integrated   Frequency of Communication with Friends and Family: More than three times a week   Frequency of Social Gatherings with Friends and Family: More than three times a week   Attends Religious Services: More than 4 times per year   Active Member of [employer] or Organizations: Yes   Attends Archivist Meetings: More than 4 times per year   Marital Status: Widowed     Review of Systems  Constitutional:  Negative for  appetite change and unexpected weight change.  HENT:  Negative for congestion, sinus pressure and sore throat.   Eyes:  Negative for pain and visual disturbance.  Respiratory:  Negative for cough and chest tightness.        Breathing stable.    Cardiovascular:  Negative for chest pain, palpitations and leg swelling.  Gastrointestinal:  Positive for diarrhea. Negative for abdominal pain, nausea and vomiting.  Genitourinary:  Negative for difficulty urinating and dysuria.  Musculoskeletal:  Negative for joint swelling and myalgias.  Skin:  Negative for color change and rash.  Neurological:  Negative for dizziness, light-headedness and headaches.  Hematological:  Negative for adenopathy. Does not bruise/bleed easily.  Psychiatric/Behavioral:  Negative for agitation and dysphoric mood.       Objective:     BP 124/72    Pulse 100    Temp 98 F (36.7 C)    Resp 16    Ht 5\' 3"  (1.6 m)    Wt 149 lb 12.8 oz (67.9 kg)    SpO2 98%    BMI 26.54 kg/m  Wt Readings from Last 3 Encounters:  05/25/21 149 lb 12.8 oz (67.9 kg)  05/24/21 148 lb 8 oz (67.4 kg)  05/10/21 148 lb 12.8 oz (67.5 kg)    Physical Exam Vitals reviewed.  Constitutional:      General: She is not in acute distress.    Appearance: Normal appearance. She is well-developed.  HENT:     Head: Normocephalic and atraumatic.     Right Ear: External ear normal.     Left Ear: External ear normal.  Eyes:     General: No scleral icterus.       Right eye: No discharge.        Left eye: No discharge.     Conjunctiva/sclera: Conjunctivae normal.  Neck:     Thyroid: No thyromegaly.  Cardiovascular:     Rate and Rhythm: Normal rate and regular rhythm.  Pulmonary:     Effort: No tachypnea, accessory muscle usage or respiratory distress.     Breath sounds: Normal breath sounds. No decreased breath sounds or wheezing.  Chest:  Breasts:    Right: No inverted nipple, mass, nipple discharge or tenderness (no axillary adenopathy).      Left: No inverted nipple, mass, nipple discharge or tenderness (no axilarry adenopathy).  Abdominal:     General: Bowel sounds are normal.     Palpations: Abdomen is soft.     Tenderness: There is no abdominal tenderness.  Musculoskeletal:        General: No swelling or tenderness.     Cervical back: Neck supple.  Lymphadenopathy:  Cervical: No cervical adenopathy.  Skin:    Findings: No erythema or rash.  Neurological:     Mental Status: She is alert and oriented to person, place, and time.  Psychiatric:        Mood and Affect: Mood normal.        Behavior: Behavior normal.     Outpatient Encounter Medications as of 05/25/2021  Medication Sig   Ascorbic Acid (VITAMIN C) 1000 MG tablet Take 1,000 mg by mouth daily.   cholecalciferol (VITAMIN D3) 25 MCG (1000 UNIT) tablet Take 1,000 Units by mouth daily.   dexamethasone (DECADRON) 4 MG tablet Take 2 tablets (8 mg total) by mouth daily. Start the day after chemotherapy for 2 days.   fluticasone (FLONASE) 50 MCG/ACT nasal spray Place 2 sprays into both nostrils daily.   levocetirizine (XYZAL) 5 MG tablet Take 1 tablet (5 mg total) by mouth every evening.   LORazepam (ATIVAN) 0.5 MG tablet Take 1 tablet (0.5 mg total) by mouth every 6 (six) hours as needed (Nausea or vomiting).   olmesartan-hydrochlorothiazide (BENICAR HCT) 20-12.5 MG tablet TAKE 1 TABLET BY MOUTH ONCE DAILY   ondansetron (ZOFRAN) 8 MG tablet Take 1 tablet (8 mg total) by mouth 2 (two) times daily as needed for refractory nausea / vomiting. Start on day 3 after chemo.   pantoprazole (PROTONIX) 40 MG tablet TAKE 1 TABLET BY MOUTH ONCE DAILY   prochlorperazine (COMPAZINE) 10 MG tablet Take 1 tablet (10 mg total) by mouth every 6 (six) hours as needed (Nausea or vomiting).   Facility-Administered Encounter Medications as of 05/25/2021  Medication   sodium chloride 0.9 % 1,000 mL with potassium chloride 20 mEq, magnesium sulfate 2 g infusion     Lab Results   Component Value Date   WBC 16.1 (H) 05/24/2021   HGB 13.6 05/24/2021   HCT 39.5 05/24/2021   PLT 264 05/24/2021   GLUCOSE 175 (H) 05/24/2021   CHOL 184 01/19/2021   TRIG 149.0 01/19/2021   HDL 41.50 01/19/2021   LDLCALC 113 (H) 01/19/2021   ALT 17 05/24/2021   AST 22 05/24/2021   NA 135 05/24/2021   K 4.1 05/24/2021   CL 98 05/24/2021   CREATININE 1.00 05/24/2021   BUN 23 05/24/2021   CO2 27 05/24/2021   TSH 0.69 09/09/2020   INR 1.0 03/18/2014   INR 1.0 03/18/2014   HGBA1C 6.6 (H) 01/19/2021   MICROALBUR 5.2 (H) 01/19/2021       Assessment & Plan:   Problem List Items Addressed This Visit     Abdominal aortic aneurysm    Followed by Dr Delana Meyer (08/06/20)  Stable.  Recommended f/u in 12 months.        Anemia    Follow cbc.  Being followed by hematology.       Diarrhea    Noticed diarrhea last night.  Minimal this am.  Criss Rosales foods.  Advance as tolerated.  Fiber.  Follow. Notify me if persistent.        Esophageal cancer [facility])    Being followed by radiation oncology and oncology.  Eating.  Receiving chemo and XRT.  Follow       GERD (gastroesophageal reflux disease)    Continues on protonix.  No acid reflux reported.       Health care maintenance    Physical today 05/25/21.  Has wanted to hold on bone density and mammogram.        History of lung cancer    Followed  by Dr Rogue Bussing.  Stable.        Hypercholesterolemia    Low cholesterol diet and exercise. Follow lipid panel.       Hypertension    Continue benicar/hctz.  Blood pressure has been doing well.   Follow pressures.  Follow metabolic panel.       Type 2 diabetes mellitus with hyperglycemia (HCC)    Given elevated sugar, recommend continuing low carb diet and exercise.  Hold on medication.  Follow.  Last a1c 6.6.         [name], MD

## 2021-05-26 ENCOUNTER — Ambulatory Visit
Admission: RE | Admit: 2021-05-26 | Discharge: 2021-05-26 | Disposition: A | Discharge status: Home / Self Care | Payer: Medicare PPO | Source: Ambulatory Visit | Attending: Radiation Oncology | Admitting: Radiation Oncology

## 2021-05-26 ENCOUNTER — Inpatient Hospital Stay: Payer: Medicare PPO

## 2021-05-26 ENCOUNTER — Encounter: Payer: Self-pay | Admitting: Internal Medicine

## 2021-05-26 ENCOUNTER — Other Ambulatory Visit: Payer: Self-pay | Admitting: *Deleted

## 2021-05-26 DIAGNOSIS — Z5111 Encounter for antineoplastic chemotherapy: Secondary | ICD-10-CM | POA: Diagnosis not present

## 2021-05-26 DIAGNOSIS — Z9221 Personal history of antineoplastic chemotherapy: Secondary | ICD-10-CM | POA: Diagnosis not present

## 2021-05-26 DIAGNOSIS — Z85118 Personal history of other malignant neoplasm of bronchus and lung: Secondary | ICD-10-CM | POA: Diagnosis not present

## 2021-05-26 DIAGNOSIS — D6481 Anemia due to antineoplastic chemotherapy: Secondary | ICD-10-CM | POA: Diagnosis not present

## 2021-05-26 DIAGNOSIS — Z923 Personal history of irradiation: Secondary | ICD-10-CM | POA: Diagnosis not present

## 2021-05-26 DIAGNOSIS — D490 Neoplasm of unspecified behavior of digestive system: Secondary | ICD-10-CM

## 2021-05-26 DIAGNOSIS — C771 Secondary and unspecified malignant neoplasm of intrathoracic lymph nodes: Secondary | ICD-10-CM | POA: Diagnosis not present

## 2021-05-26 DIAGNOSIS — Z510 Encounter for antineoplastic radiation therapy: Secondary | ICD-10-CM | POA: Diagnosis not present

## 2021-05-26 DIAGNOSIS — Z87891 Personal history of nicotine dependence: Secondary | ICD-10-CM | POA: Diagnosis not present

## 2021-05-26 DIAGNOSIS — C154 Malignant neoplasm of middle third of esophagus: Secondary | ICD-10-CM | POA: Diagnosis not present

## 2021-05-26 NOTE — Telephone Encounter (Signed)
Faxed fmla forms (signed by Ander Purpura, NP). Will send mychart msg to daughter to update that forms have been faxed.

## 2021-05-26 NOTE — Progress Notes (Signed)
Nutrition Follow-up:  Patient with esophageal cancer.  Patient receiving palliative chemotherapy and radiation.  Met with patient and son in law, Heather Cooper after radiation.  Patient reports that appetite is good although did have nausea, vomiting and diarrhea the first of this week prior to starting treatment.  Thinks she picked up a stomach bug at a family baby shower this past weekend.  Reports that she is feeling better and no bowel movement today so far.  Has been eating better than first of week. Has been drinking ensure complete 1 a day.  On 12/12 when came for treatment was prescribed zofran and compazine for nausea.      Medications: zofran, compazine  Labs: reviewed  Anthropometrics:   Weight 149 lb 12.8 oz 12/12  148 lb 12.8 oz on 12/1 152 lb 9.6 oz on 8/26   NUTRITION DIAGNOSIS: Inadequate oral intake stable   INTERVENTION:  Encouraged patient to be proactive with taking nausea medication if needed Encouraged her to call cancer center if diarrhea is not better.   Continue oral nutrition supplement for added nutrition     MONITORING, EVALUATION, GOAL: weight trends, intake   NEXT VISIT: Tuesday, Dec 20 during infusion  Heather Cooper, [place], [place] Registered Dietitian [phone] (mobile)

## 2021-05-27 ENCOUNTER — Ambulatory Visit
Admission: RE | Admit: 2021-05-27 | Discharge: 2021-05-27 | Disposition: A | Discharge status: Home / Self Care | Payer: Medicare PPO | Source: Ambulatory Visit | Attending: Radiation Oncology | Admitting: Radiation Oncology

## 2021-05-27 DIAGNOSIS — C771 Secondary and unspecified malignant neoplasm of intrathoracic lymph nodes: Secondary | ICD-10-CM | POA: Diagnosis not present

## 2021-05-27 DIAGNOSIS — Z87891 Personal history of nicotine dependence: Secondary | ICD-10-CM | POA: Diagnosis not present

## 2021-05-27 DIAGNOSIS — Z5111 Encounter for antineoplastic chemotherapy: Secondary | ICD-10-CM | POA: Diagnosis not present

## 2021-05-27 DIAGNOSIS — Z510 Encounter for antineoplastic radiation therapy: Secondary | ICD-10-CM | POA: Diagnosis not present

## 2021-05-27 DIAGNOSIS — C154 Malignant neoplasm of middle third of esophagus: Secondary | ICD-10-CM | POA: Diagnosis not present

## 2021-05-27 DIAGNOSIS — Z923 Personal history of irradiation: Secondary | ICD-10-CM | POA: Diagnosis not present

## 2021-05-27 DIAGNOSIS — Z9221 Personal history of antineoplastic chemotherapy: Secondary | ICD-10-CM | POA: Diagnosis not present

## 2021-05-27 DIAGNOSIS — Z85118 Personal history of other malignant neoplasm of bronchus and lung: Secondary | ICD-10-CM | POA: Diagnosis not present

## 2021-05-27 DIAGNOSIS — D6481 Anemia due to antineoplastic chemotherapy: Secondary | ICD-10-CM | POA: Diagnosis not present

## 2021-05-28 ENCOUNTER — Telehealth: Payer: Self-pay | Admitting: *Deleted

## 2021-05-28 ENCOUNTER — Ambulatory Visit
Admission: RE | Admit: 2021-05-28 | Discharge: 2021-05-28 | Disposition: A | Discharge status: Home / Self Care | Payer: Medicare PPO | Source: Ambulatory Visit | Attending: Radiation Oncology | Admitting: Radiation Oncology

## 2021-05-28 DIAGNOSIS — C154 Malignant neoplasm of middle third of esophagus: Secondary | ICD-10-CM | POA: Diagnosis not present

## 2021-05-28 DIAGNOSIS — Z85118 Personal history of other malignant neoplasm of bronchus and lung: Secondary | ICD-10-CM | POA: Diagnosis not present

## 2021-05-28 DIAGNOSIS — Z9221 Personal history of antineoplastic chemotherapy: Secondary | ICD-10-CM | POA: Diagnosis not present

## 2021-05-28 DIAGNOSIS — D6481 Anemia due to antineoplastic chemotherapy: Secondary | ICD-10-CM | POA: Diagnosis not present

## 2021-05-28 DIAGNOSIS — Z510 Encounter for antineoplastic radiation therapy: Secondary | ICD-10-CM | POA: Diagnosis not present

## 2021-05-28 DIAGNOSIS — Z87891 Personal history of nicotine dependence: Secondary | ICD-10-CM | POA: Diagnosis not present

## 2021-05-28 DIAGNOSIS — C771 Secondary and unspecified malignant neoplasm of intrathoracic lymph nodes: Secondary | ICD-10-CM | POA: Diagnosis not present

## 2021-05-28 DIAGNOSIS — Z923 Personal history of irradiation: Secondary | ICD-10-CM | POA: Diagnosis not present

## 2021-05-28 DIAGNOSIS — Z5111 Encounter for antineoplastic chemotherapy: Secondary | ICD-10-CM | POA: Diagnosis not present

## 2021-05-28 NOTE — Telephone Encounter (Signed)
Patient called reporting that she caught a GI bug from her daughter and she has some diarrhea, not real bad, but was asking if we would order her something for it just in case. I asked if she had tried anything for it over the counter and she said she has not tried anything to this point. I advised her to get Imodium AD over the counter and try that and if it does not improve, to please call back. She was in agreement with this plan

## 2021-05-30 ENCOUNTER — Encounter: Payer: Self-pay | Admitting: Internal Medicine

## 2021-05-30 DIAGNOSIS — C159 Malignant neoplasm of esophagus, unspecified: Secondary | ICD-10-CM | POA: Insufficient documentation

## 2021-05-30 DIAGNOSIS — R197 Diarrhea, unspecified: Secondary | ICD-10-CM | POA: Insufficient documentation

## 2021-05-30 NOTE — Assessment & Plan Note (Signed)
Continues on protonix.  No acid reflux reported.

## 2021-05-30 NOTE — Assessment & Plan Note (Signed)
Physical today 05/25/21.  Has wanted to hold on bone density and mammogram.

## 2021-05-30 NOTE — Assessment & Plan Note (Signed)
Follow cbc.  Being followed by hematology.

## 2021-05-30 NOTE — Assessment & Plan Note (Signed)
Continue benicar/hctz.  Blood pressure has been doing well.   Follow pressures.  Follow metabolic panel.

## 2021-05-30 NOTE — Assessment & Plan Note (Signed)
Noticed diarrhea last night.  Minimal this am.  Criss Rosales foods.  Advance as tolerated.  Fiber.  Follow. Notify me if persistent.

## 2021-05-30 NOTE — Assessment & Plan Note (Signed)
Being followed by radiation oncology and oncology.  Eating.  Receiving chemo and XRT.  Follow

## 2021-05-30 NOTE — Assessment & Plan Note (Addendum)
Given elevated sugar, recommend continuing low carb diet and exercise.  Hold on medication.  Follow.  Last a1c 6.6.

## 2021-05-30 NOTE — Assessment & Plan Note (Signed)
Followed by Dr Rogue Bussing.  Stable.

## 2021-05-30 NOTE — Assessment & Plan Note (Signed)
Low cholesterol diet and exercise.  Follow lipid panel.   

## 2021-05-30 NOTE — Assessment & Plan Note (Signed)
Followed by Dr Delana Meyer (08/06/20)  Stable.  Recommended f/u in 12 months.

## 2021-05-31 ENCOUNTER — Other Ambulatory Visit: Payer: Self-pay | Admitting: Oncology

## 2021-05-31 ENCOUNTER — Ambulatory Visit
Admission: RE | Admit: 2021-05-31 | Discharge: 2021-05-31 | Disposition: A | Discharge status: Home / Self Care | Payer: Medicare PPO | Source: Ambulatory Visit | Attending: Radiation Oncology | Admitting: Radiation Oncology

## 2021-05-31 DIAGNOSIS — C771 Secondary and unspecified malignant neoplasm of intrathoracic lymph nodes: Secondary | ICD-10-CM | POA: Diagnosis not present

## 2021-05-31 DIAGNOSIS — Z9221 Personal history of antineoplastic chemotherapy: Secondary | ICD-10-CM | POA: Diagnosis not present

## 2021-05-31 DIAGNOSIS — D6481 Anemia due to antineoplastic chemotherapy: Secondary | ICD-10-CM | POA: Diagnosis not present

## 2021-05-31 DIAGNOSIS — Z510 Encounter for antineoplastic radiation therapy: Secondary | ICD-10-CM | POA: Diagnosis not present

## 2021-05-31 DIAGNOSIS — Z923 Personal history of irradiation: Secondary | ICD-10-CM | POA: Diagnosis not present

## 2021-05-31 DIAGNOSIS — Z85118 Personal history of other malignant neoplasm of bronchus and lung: Secondary | ICD-10-CM | POA: Diagnosis not present

## 2021-05-31 DIAGNOSIS — C154 Malignant neoplasm of middle third of esophagus: Secondary | ICD-10-CM | POA: Diagnosis not present

## 2021-05-31 DIAGNOSIS — Z5111 Encounter for antineoplastic chemotherapy: Secondary | ICD-10-CM | POA: Diagnosis not present

## 2021-05-31 DIAGNOSIS — Z87891 Personal history of nicotine dependence: Secondary | ICD-10-CM | POA: Diagnosis not present

## 2021-06-01 ENCOUNTER — Inpatient Hospital Stay: Payer: Medicare PPO

## 2021-06-01 ENCOUNTER — Inpatient Hospital Stay (HOSPITAL_BASED_OUTPATIENT_CLINIC_OR_DEPARTMENT_OTHER): Payer: Medicare PPO | Admitting: Oncology

## 2021-06-01 ENCOUNTER — Ambulatory Visit
Admission: RE | Admit: 2021-06-01 | Discharge: 2021-06-01 | Disposition: A | Discharge status: Home / Self Care | Payer: Medicare PPO | Source: Ambulatory Visit | Attending: Radiation Oncology | Admitting: Radiation Oncology

## 2021-06-01 ENCOUNTER — Other Ambulatory Visit: Payer: Self-pay

## 2021-06-01 ENCOUNTER — Encounter: Payer: Self-pay | Admitting: Oncology

## 2021-06-01 VITALS — BP 146/59 | HR 88

## 2021-06-01 VITALS — BP 120/52 | HR 89 | Temp 97.5°F | Resp 16 | Ht 63.0 in | Wt 146.7 lb

## 2021-06-01 DIAGNOSIS — C771 Secondary and unspecified malignant neoplasm of intrathoracic lymph nodes: Secondary | ICD-10-CM | POA: Diagnosis not present

## 2021-06-01 DIAGNOSIS — C154 Malignant neoplasm of middle third of esophagus: Secondary | ICD-10-CM | POA: Diagnosis not present

## 2021-06-01 DIAGNOSIS — D490 Neoplasm of unspecified behavior of digestive system: Secondary | ICD-10-CM

## 2021-06-01 DIAGNOSIS — Z85118 Personal history of other malignant neoplasm of bronchus and lung: Secondary | ICD-10-CM | POA: Diagnosis not present

## 2021-06-01 DIAGNOSIS — Z923 Personal history of irradiation: Secondary | ICD-10-CM | POA: Diagnosis not present

## 2021-06-01 DIAGNOSIS — D6481 Anemia due to antineoplastic chemotherapy: Secondary | ICD-10-CM | POA: Diagnosis not present

## 2021-06-01 DIAGNOSIS — Z87891 Personal history of nicotine dependence: Secondary | ICD-10-CM | POA: Diagnosis not present

## 2021-06-01 DIAGNOSIS — D649 Anemia, unspecified: Secondary | ICD-10-CM

## 2021-06-01 DIAGNOSIS — Z510 Encounter for antineoplastic radiation therapy: Secondary | ICD-10-CM | POA: Diagnosis not present

## 2021-06-01 DIAGNOSIS — Z5111 Encounter for antineoplastic chemotherapy: Secondary | ICD-10-CM | POA: Diagnosis not present

## 2021-06-01 DIAGNOSIS — Z9221 Personal history of antineoplastic chemotherapy: Secondary | ICD-10-CM | POA: Diagnosis not present

## 2021-06-01 LAB — CBC WITH DIFFERENTIAL/PLATELET
Abs Immature Granulocytes: 0.03 10*3/uL (ref 0.00–0.07)
Basophils Absolute: 0 10*3/uL (ref 0.0–0.1)
Basophils Relative: 0 %
Eosinophils Absolute: 0.1 10*3/uL (ref 0.0–0.5)
Eosinophils Relative: 1 %
HCT: 32 % — ABNORMAL LOW (ref 36.0–46.0)
Hemoglobin: 10.9 g/dL — ABNORMAL LOW (ref 12.0–15.0)
Immature Granulocytes: 1 %
Lymphocytes Relative: 28 %
Lymphs Abs: 1.3 10*3/uL (ref 0.7–4.0)
MCH: 29.6 pg (ref 26.0–34.0)
MCHC: 34.1 g/dL (ref 30.0–36.0)
MCV: 87 fL (ref 80.0–100.0)
Monocytes Absolute: 0.3 10*3/uL (ref 0.1–1.0)
Monocytes Relative: 6 %
Neutro Abs: 2.9 10*3/uL (ref 1.7–7.7)
Neutrophils Relative %: 64 %
Platelets: 242 10*3/uL (ref 150–400)
RBC: 3.68 MIL/uL — ABNORMAL LOW (ref 3.87–5.11)
RDW: 12.6 % (ref 11.5–15.5)
WBC: 4.5 10*3/uL (ref 4.0–10.5)
nRBC: 0 % (ref 0.0–0.2)

## 2021-06-01 LAB — COMPREHENSIVE METABOLIC PANEL
ALT: 21 U/L (ref 0–44)
AST: 20 U/L (ref 15–41)
Albumin: 3.7 g/dL (ref 3.5–5.0)
Alkaline Phosphatase: 58 U/L (ref 38–126)
Anion gap: 7 (ref 5–15)
BUN: 20 mg/dL (ref 8–23)
CO2: 27 mmol/L (ref 22–32)
Calcium: 9 mg/dL (ref 8.9–10.3)
Chloride: 99 mmol/L (ref 98–111)
Creatinine, Ser: 0.92 mg/dL (ref 0.44–1.00)
GFR, Estimated: 60 mL/min — ABNORMAL LOW (ref >60)
Glucose, Bld: 106 mg/dL — ABNORMAL HIGH (ref 70–99)
Potassium: 3.9 mmol/L (ref 3.5–5.1)
Sodium: 133 mmol/L — ABNORMAL LOW (ref 135–145)
Total Bilirubin: 0.5 mg/dL (ref 0.3–1.2)
Total Protein: 7.2 g/dL (ref 6.5–8.1)

## 2021-06-01 MED ORDER — PALONOSETRON HCL INJECTION 0.25 MG/5ML
0.2500 mg | Freq: Once | INTRAVENOUS | Status: AC
  Administered 2021-06-01: 12:00:00 0.25 mg via INTRAVENOUS
  Filled 2021-06-01: qty 5

## 2021-06-01 MED ORDER — SODIUM CHLORIDE 0.9 % IV SOLN
Freq: Once | INTRAVENOUS | Status: AC
  Filled 2021-06-01: qty 250

## 2021-06-01 MED ORDER — SODIUM CHLORIDE 0.9 % IV SOLN
10.0000 mg | Freq: Once | INTRAVENOUS | Status: AC
  Administered 2021-06-01: 12:00:00 10 mg via INTRAVENOUS
  Filled 2021-06-01: qty 10

## 2021-06-01 MED ORDER — PACLITAXEL PROTEIN-BOUND CHEMO INJECTION 100 MG
100.0000 mg/m2 | Freq: Once | INTRAVENOUS | Status: AC
  Administered 2021-06-01: 12:00:00 175 mg via INTRAVENOUS
  Filled 2021-06-01: qty 35

## 2021-06-01 MED ORDER — SODIUM CHLORIDE 0.9 % IV SOLN
132.8000 mg | Freq: Once | INTRAVENOUS | Status: AC
  Administered 2021-06-01: 13:00:00 130 mg via INTRAVENOUS
  Filled 2021-06-01: qty 13

## 2021-06-01 NOTE — Patient Instructions (Signed)
[facility]  Discharge Instructions: ?Thank you for choosing [facility] to provide your oncology and hematology care.  ?If you have a lab appointment with the Cancer Center, please go directly to the Cancer Center and check in at the registration area. ? ?Wear comfortable clothing and clothing appropriate for easy access to any Portacath or PICC line.  ? ?We strive to give you quality time with your provider. You may need to reschedule your appointment if you arrive late (15 or more minutes).  Arriving late affects you and other patients whose appointments are after yours.  Also, if you miss three or more appointments without notifying the office, you may be dismissed from the clinic at the provider?s discretion.    ?  ?For prescription refill requests, have your pharmacy contact our office and allow 72 hours for refills to be completed.   ? ?  ?To help prevent nausea and vomiting after your treatment, we encourage you to take your nausea medication as directed. ? ?BELOW ARE SYMPTOMS THAT SHOULD BE REPORTED IMMEDIATELY: ?*FEVER GREATER THAN 100.4 F (38 ?C) OR HIGHER ?*CHILLS OR SWEATING ?*NAUSEA AND VOMITING THAT IS NOT CONTROLLED WITH YOUR NAUSEA MEDICATION ?*UNUSUAL SHORTNESS OF BREATH ?*UNUSUAL BRUISING OR BLEEDING ?*URINARY PROBLEMS (pain or burning when urinating, or frequent urination) ?*BOWEL PROBLEMS (unusual diarrhea, constipation, pain near the anus) ?TENDERNESS IN MOUTH AND THROAT WITH OR WITHOUT PRESENCE OF ULCERS (sore throat, sores in mouth, or a toothache) ?UNUSUAL RASH, SWELLING OR PAIN  ?UNUSUAL VAGINAL DISCHARGE OR ITCHING  ? ?Items with * indicate a potential emergency and should be followed up as soon as possible or go to the Emergency Department if any problems should occur. ? ?Please show the CHEMOTHERAPY ALERT CARD or IMMUNOTHERAPY ALERT CARD at check-in to the Emergency Department and triage nurse. ? ?Should you have questions after your visit  or need to cancel or reschedule your appointment, please contact [facility]  [phone] and follow the prompts.  Office hours are 8:00 a.m. to 4:30 p.m. Monday - Friday. Please note that voicemails left after 4:00 p.m. may not be returned until the following business day.  We are closed weekends and major holidays. You have access to a nurse at all times for urgent questions. Please call the main number to the clinic [phone] and follow the prompts. ? ?For any non-urgent questions, you may also contact your provider using MyChart. We now offer e-Visits for anyone 18 and older to request care online for non-urgent symptoms. For details visit [email or web address]. ?  ?Also download the MyChart app! Go to the app store, search "MyChart", open the app, select [facility], and log in with your MyChart username and password. ? ?Due to Covid, a mask is required upon entering the hospital/clinic. If you do not have a mask, one will be given to you upon arrival. For doctor visits, patients may have 1 support person aged 18 or older with them. For treatment visits, patients cannot have anyone with them due to current Covid guidelines and our immunocompromised population.  ?

## 2021-06-01 NOTE — Progress Notes (Signed)
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  Starting radiation and chemotherapy from April 14, 2014 Patient was started on carboplatinum andTaxol Herve were developed an allergic reaction to Taxol so would be changed to Abraxane 3.patient has finished 6 cycles of weekly chemotherapy with carboplatinum  and radiation therapy(May 28, 2014) 4.started on  NIVOLULAMAB because of persistent disease July 02, 2014. 5.  NIVOLULAMAB was discontinued because of persistent diarrhea in July of 2016.  August of 2016 CT scan was stable so no further chemotherapy  # AUG 25th PET- STABLE RUL MASS [radiation fibrosis; <1cm ? Mediastinal recurrence];   # AAA/ 3.3x 3.9 Stable [Dr.Schnier] -----------------------------------------------------   DIAGNOSIS: Lung cancer  STAGE:  IV-NED       ;GOALS: control  CURRENT/MOST RECENT THERAPY : surveillance    History of lung cancer  Malignant neoplasm of right upper lobe of lung ([place])  08/07/2019 Initial Diagnosis   Malignant neoplasm of right upper lobe of lung [facility])    INTERVAL HISTORY:  Mrs. Kent an 87 year old female with newly diagnosed squamous cell carcinoma of the esophagus.  Has history  of a right upper lobe adenocarcinoma stage IV approximately 8 years ago.  She is currently undergoing chemoradiation with carbo/Abraxane status post 1 cycle.  She presents back today with her daughter.  States that last week she thought she had a stomach bug and came in with nausea, abdominal pain and some vomiting.  They proceeded with treatment anyway and over the last several days symptoms have improved.  She continues to feel sluggish but states each day she is feeling better.  Bowel movements are normal.  She is following well since dilation of her esophagus.   Review of Systems  Constitutional:  Positive for malaise/fatigue.  Gastrointestinal:  Positive for diarrhea, nausea and vomiting.    PAST MEDICAL HISTORY :  Past Medical History:  Diagnosis Date   Chemotherapy induced nausea and vomiting    Chicken pox    Esophageal cancer (HCC)    Hypercalcemia    familial hypocalciuric hypercalcemia   Hypercholesterolemia    Hypertension    Lung cancer ([place])    Osteoporosis    Thyroid disease     PAST SURGICAL HISTORY :   Past Surgical History:  Procedure Laterality Date   ABDOMINAL HYSTERECTOMY     partial   CHOLECYSTECTOMY     COLONOSCOPY WITH PROPOFOL N/A 04/17/2017   Procedure: COLONOSCOPY WITH PROPOFOL;  Surgeon: Manya Silvas, MD;  Location: [facility] ENDOSCOPY;  Service: Endoscopy;  Laterality: N/A;   ESOPHAGOGASTRODUODENOSCOPY (EGD) WITH PROPOFOL N/A 04/13/2021   Procedure: ESOPHAGOGASTRODUODENOSCOPY (EGD) WITH PROPOFOL;  Surgeon: [name], MD;  Location: [facility] ENDOSCOPY;  Service: Endoscopy;  Laterality: N/A;   EUS N/A 04/22/2021   Procedure: FULL  UPPER ENDOSCOPIC ULTRASOUND (EUS) RADIAL;  Surgeon: Jola Schmidt, MD;  Location: [facility] ENDOSCOPY;  Service: Endoscopy;  Laterality: N/A;  [facility] needed   transvaginal hysterectomy  04/18/05   with anterior colporrhaphy    FAMILY HISTORY :   Family History  Problem Relation Age of Onset   Stroke Mother    Hypertension  Mother    Prostate cancer Father    Cancer Father        prostate   Heart disease Brother        s/p CABG   Colon cancer Neg Hx     SOCIAL HISTORY:   Social History   Tobacco Use   Smoking status: Former    Types: Cigarettes    Quit date: 06/13/1997    Years since quitting: 23.9   Smokeless tobacco: Never  Vaping Use   Vaping Use: Never used  Substance Use Topics   Alcohol use: No    Alcohol/week: 0.0 standard drinks   Drug use: No    ALLERGIES:  is allergic to paclitaxel.  MEDICATIONS:  Current Outpatient Medications  Medication Sig Dispense Refill   dexamethasone (DECADRON) 4 MG tablet Take 2 tablets (8 mg total) by mouth daily. Start the day after chemotherapy for 2 days. 30 tablet 1   fluticasone (FLONASE) 50 MCG/ACT nasal spray Place 2 sprays into both nostrils daily. 16 g 3   levocetirizine (XYZAL) 5 MG tablet Take 1 tablet (5 mg total) by mouth every evening. 90 tablet 2   LORazepam (ATIVAN) 0.5 MG tablet Take 1 tablet (0.5 mg total) by mouth every 6 (six) hours as needed (Nausea or vomiting). 30 tablet 0   olmesartan-hydrochlorothiazide (BENICAR HCT) 20-12.5 MG tablet TAKE 1 TABLET BY MOUTH ONCE DAILY 90 tablet 1   ondansetron (ZOFRAN) 8 MG tablet Take 1 tablet (8 mg total) by mouth 2 (two) times daily as needed for refractory nausea / vomiting. Start on day 3 after chemo. 30 tablet 1   pantoprazole (PROTONIX) 40 MG tablet TAKE 1 TABLET BY MOUTH ONCE DAILY 90 tablet 3   prochlorperazine (COMPAZINE) 10 MG tablet Take 1 tablet (10 mg total) by mouth every 6 (six) hours as needed (Nausea or vomiting). 30 tablet 1   Ascorbic Acid (VITAMIN C) 1000 MG tablet Take 1,000 mg by mouth daily. (Patient not taking: Reported on 06/01/2021)     cholecalciferol (VITAMIN D3) 25 MCG (1000 UNIT) tablet Take 1,000 Units by mouth daily. (Patient not taking: Reported on 06/01/2021)     No current facility-administered medications for this visit.   Facility-Administered Medications Ordered  in Other Visits  Medication Dose Route Frequency Provider Last Rate Last Admin   CARBOplatin (PARAPLATIN) 130 mg in sodium chloride 0.9 % 100 mL chemo infusion  130 mg Intravenous Once Grayland Ormond, Kathlene November, MD       PACLitaxel-protein bound (ABRAXANE) chemo infusion 175 mg  100 mg/m2 (Treatment Plan Recorded) Intravenous Once Lloyd Huger, MD 70 mL/hr at 06/01/21 1201 175 mg at 06/01/21 1201   sodium chloride 0.9 % 1,000 mL with potassium chloride 20 mEq, magnesium sulfate 2 g infusion   Intravenous Continuous Forest Gleason, MD   Stopped at 12/04/14 1300    PHYSICAL EXAMINATION: ECOG PERFORMANCE STATUS: 0 - Asymptomatic  BP (!) 120/52    Pulse 89    Temp (!) 97.5 F (36.4 C)    Resp 16    Ht _0  (1.6 m)    Wt 146 lb 11.2 oz (66.5 kg)  SpO2 100%    BMI 25.99 kg/m   Filed Weights   06/01/21 1038  Weight: 146 lb 11.2 oz (66.5 kg)    Physical Exam Constitutional:      Appearance: Normal appearance.  HENT:     Head: Normocephalic and atraumatic.  Eyes:     Pupils: Pupils are equal, round, and reactive to light.  Cardiovascular:     Rate and Rhythm: Normal rate and regular rhythm.     Heart sounds: Normal heart sounds. No murmur heard. Pulmonary:     Effort: Pulmonary effort is normal.     Breath sounds: Normal breath sounds. No wheezing.  Abdominal:     General: Bowel sounds are normal. There is no distension.     Palpations: Abdomen is soft.     Tenderness: There is no abdominal tenderness.  Musculoskeletal:        General: Normal range of motion.     Cervical back: Normal range of motion.  Skin:    General: Skin is warm and dry.     Findings: No rash.  Neurological:     Mental Status: She is alert and oriented to person, place, and time.     Gait: Gait is intact.  Psychiatric:        Mood and Affect: Mood and affect normal.        Cognition and Memory: Memory normal.        Judgment: Judgment normal.     LABORATORY DATA:  I have reviewed the data as  listed    Component Value Date/Time   NA 133 (L) 06/01/2021 1023   NA 130 (L) 10/09/2014 0846   K 3.9 06/01/2021 1023   K 4.1 10/09/2014 0846   CL 99 06/01/2021 1023   CL 98 (L) 10/09/2014 0846   CO2 27 06/01/2021 1023   CO2 25 10/09/2014 0846   GLUCOSE 106 (H) 06/01/2021 1023   GLUCOSE 161 (H) 10/09/2014 0846   BUN 20 06/01/2021 1023   BUN 14 10/09/2014 0846   CREATININE 0.92 06/01/2021 1023   CREATININE 0.70 10/09/2014 0846   CALCIUM 9.0 06/01/2021 1023   CALCIUM 9.1 10/09/2014 0846   PROT 7.2 06/01/2021 1023   PROT 7.3 10/09/2014 0846   ALBUMIN 3.7 06/01/2021 1023   ALBUMIN 3.6 10/09/2014 0846   AST 20 06/01/2021 1023   AST 16 10/09/2014 0846   ALT 21 06/01/2021 1023   ALT 13 (L) 10/09/2014 0846   ALKPHOS 58 06/01/2021 1023   ALKPHOS 70 10/09/2014 0846   BILITOT 0.5 06/01/2021 1023   BILITOT 1.0 10/09/2014 0846   GFRNONAA 60 (L) 06/01/2021 1023   GFRNONAA >60 10/09/2014 0846   GFRAA >60 02/05/2020 1004   GFRAA >60 10/09/2014 0846    No results found for: SPEP, UPEP  Lab Results  Component Value Date   WBC 4.5 06/01/2021   NEUTROABS 2.9 06/01/2021   HGB 10.9 (L) 06/01/2021   HCT 32.0 (L) 06/01/2021   MCV 87.0 06/01/2021   PLT 242 06/01/2021      Chemistry      Component Value Date/Time   NA 133 (L) 06/01/2021 1023   NA 130 (L) 10/09/2014 0846   K 3.9 06/01/2021 1023   K 4.1 10/09/2014 0846   CL 99 06/01/2021 1023   CL 98 (L) 10/09/2014 0846   CO2 27 06/01/2021 1023   CO2 25 10/09/2014 0846   BUN 20 06/01/2021 1023   BUN 14 10/09/2014 0846   CREATININE 0.92 06/01/2021 1023  CREATININE 0.70 10/09/2014 0846   GLU 111 03/18/2014 1048      Component Value Date/Time   CALCIUM 9.0 06/01/2021 1023   CALCIUM 9.1 10/09/2014 0846   ALKPHOS 58 06/01/2021 1023   ALKPHOS 70 10/09/2014 0846   AST 20 06/01/2021 1023   AST 16 10/09/2014 0846   ALT 21 06/01/2021 1023   ALT 13 (L) 10/09/2014 0846   BILITOT 0.5 06/01/2021 1023   BILITOT 1.0 10/09/2014 0846        RADIOGRAPHIC STUDIES: I have personally reviewed the radiological images as listed and agreed with the findings in the report. No results found.    ASSESSMENT & PLAN: Mrs. Heinlein is an 87 year old female who is followed by Dr. Rogue Bussing for esophageal cancer currently status post 1 cycle of chemo with carbo/Abraxane and concurrent radiation.  She is here for assessment prior to cycle 2.  Squamous cell carcinoma of the midesophagus- Status post 1 cycle of carbo and Abraxane along with concurrent radiation.  Tolerated cycle 1 well.  Labs from today are acceptable for treatment.  Proceed with cycle 2 carbo/Abraxane.  History of right upper lobe lung cancer- Status post chemoradiation.  Most recent PET did not reveal evidence of recurrent lung cancer.  Chemo induced anemia-hemoglobin 10.9 (13.6).  Will monitor.   Disposition- Proceed with cycle 2 carbo Abraxane weekly. Continue daily radiation. Return to clinic in 1 week for labs, MD assessment and cycle 3 carbo/Abraxane.  No orders of the defined types were placed in this encounter.  I spent 25 minutes dedicated to the care of this patient (face-to-face and non-face-to-face) on the date of the encounter to include what is described in the assessment and plan.  All questions were answered. The patient knows to call the clinic with any problems, questions or concerns.      Jacquelin Hawking, NP 06/01/2021 12:22 PM

## 2021-06-01 NOTE — Progress Notes (Signed)
Nutrition Follow-up:  Patient with esophageal cancer.  Patient receiving palliative chemotherapy and radiation.   Met with patient during infusion.  Patient reports that her diarrhea is better and she feels like she has gotten over the GI bug.  Drinking ensure daily.  Ate boiled eggs, toast and 1/2 blueberry muffin for breakfast this am.  Had meatloaf with mashed potatoes TV dinner last night.  Denies trouble swallowing.      Medications: reviewed  Labs: reviewed  Anthropometrics:   Weight 146 lb today  149 lb 12.8 oz on 12/12 148 lb on 12/1 152 lb on 8/26   NUTRITION DIAGNOSIS: Inadequate oral intake stable   INTERVENTION:  Continue high calorie, high protein foods Continue ensure shakes.      MONITORING, EVALUATION, GOAL: weight loss, intake   NEXT VISIT: Thursday, Dec 29 after radiation  Marijean Montanye B. Zenia Resides, [place], [place] Registered Dietitian [phone] (mobile)

## 2021-06-02 ENCOUNTER — Ambulatory Visit
Admission: RE | Admit: 2021-06-02 | Discharge: 2021-06-02 | Disposition: A | Discharge status: Home / Self Care | Payer: Medicare PPO | Source: Ambulatory Visit | Attending: Radiation Oncology | Admitting: Radiation Oncology

## 2021-06-02 DIAGNOSIS — C154 Malignant neoplasm of middle third of esophagus: Secondary | ICD-10-CM | POA: Diagnosis not present

## 2021-06-02 DIAGNOSIS — Z923 Personal history of irradiation: Secondary | ICD-10-CM | POA: Diagnosis not present

## 2021-06-02 DIAGNOSIS — C771 Secondary and unspecified malignant neoplasm of intrathoracic lymph nodes: Secondary | ICD-10-CM | POA: Diagnosis not present

## 2021-06-02 DIAGNOSIS — D6481 Anemia due to antineoplastic chemotherapy: Secondary | ICD-10-CM | POA: Diagnosis not present

## 2021-06-02 DIAGNOSIS — Z87891 Personal history of nicotine dependence: Secondary | ICD-10-CM | POA: Diagnosis not present

## 2021-06-02 DIAGNOSIS — Z5111 Encounter for antineoplastic chemotherapy: Secondary | ICD-10-CM | POA: Diagnosis not present

## 2021-06-02 DIAGNOSIS — Z510 Encounter for antineoplastic radiation therapy: Secondary | ICD-10-CM | POA: Diagnosis not present

## 2021-06-02 DIAGNOSIS — Z85118 Personal history of other malignant neoplasm of bronchus and lung: Secondary | ICD-10-CM | POA: Diagnosis not present

## 2021-06-02 DIAGNOSIS — Z9221 Personal history of antineoplastic chemotherapy: Secondary | ICD-10-CM | POA: Diagnosis not present

## 2021-06-03 ENCOUNTER — Ambulatory Visit
Admission: RE | Admit: 2021-06-03 | Discharge: 2021-06-03 | Disposition: A | Discharge status: Home / Self Care | Payer: Medicare PPO | Source: Ambulatory Visit | Attending: Radiation Oncology | Admitting: Radiation Oncology

## 2021-06-03 DIAGNOSIS — Z510 Encounter for antineoplastic radiation therapy: Secondary | ICD-10-CM | POA: Diagnosis not present

## 2021-06-03 DIAGNOSIS — Z9221 Personal history of antineoplastic chemotherapy: Secondary | ICD-10-CM | POA: Diagnosis not present

## 2021-06-03 DIAGNOSIS — C154 Malignant neoplasm of middle third of esophagus: Secondary | ICD-10-CM | POA: Diagnosis not present

## 2021-06-03 DIAGNOSIS — Z923 Personal history of irradiation: Secondary | ICD-10-CM | POA: Diagnosis not present

## 2021-06-03 DIAGNOSIS — Z85118 Personal history of other malignant neoplasm of bronchus and lung: Secondary | ICD-10-CM | POA: Diagnosis not present

## 2021-06-03 DIAGNOSIS — C771 Secondary and unspecified malignant neoplasm of intrathoracic lymph nodes: Secondary | ICD-10-CM | POA: Diagnosis not present

## 2021-06-03 DIAGNOSIS — Z87891 Personal history of nicotine dependence: Secondary | ICD-10-CM | POA: Diagnosis not present

## 2021-06-03 DIAGNOSIS — Z5111 Encounter for antineoplastic chemotherapy: Secondary | ICD-10-CM | POA: Diagnosis not present

## 2021-06-03 DIAGNOSIS — D6481 Anemia due to antineoplastic chemotherapy: Secondary | ICD-10-CM | POA: Diagnosis not present

## 2021-06-04 ENCOUNTER — Ambulatory Visit
Admission: RE | Admit: 2021-06-04 | Discharge: 2021-06-04 | Disposition: A | Discharge status: Home / Self Care | Payer: Medicare PPO | Source: Ambulatory Visit | Attending: Radiation Oncology | Admitting: Radiation Oncology

## 2021-06-04 DIAGNOSIS — Z87891 Personal history of nicotine dependence: Secondary | ICD-10-CM | POA: Diagnosis not present

## 2021-06-04 DIAGNOSIS — Z923 Personal history of irradiation: Secondary | ICD-10-CM | POA: Diagnosis not present

## 2021-06-04 DIAGNOSIS — Z510 Encounter for antineoplastic radiation therapy: Secondary | ICD-10-CM | POA: Diagnosis not present

## 2021-06-04 DIAGNOSIS — C771 Secondary and unspecified malignant neoplasm of intrathoracic lymph nodes: Secondary | ICD-10-CM | POA: Diagnosis not present

## 2021-06-04 DIAGNOSIS — Z9221 Personal history of antineoplastic chemotherapy: Secondary | ICD-10-CM | POA: Diagnosis not present

## 2021-06-04 DIAGNOSIS — Z5111 Encounter for antineoplastic chemotherapy: Secondary | ICD-10-CM | POA: Diagnosis not present

## 2021-06-04 DIAGNOSIS — D6481 Anemia due to antineoplastic chemotherapy: Secondary | ICD-10-CM | POA: Diagnosis not present

## 2021-06-04 DIAGNOSIS — C154 Malignant neoplasm of middle third of esophagus: Secondary | ICD-10-CM | POA: Diagnosis not present

## 2021-06-04 DIAGNOSIS — Z85118 Personal history of other malignant neoplasm of bronchus and lung: Secondary | ICD-10-CM | POA: Diagnosis not present

## 2021-06-08 ENCOUNTER — Ambulatory Visit
Admission: RE | Admit: 2021-06-08 | Discharge: 2021-06-08 | Disposition: A | Discharge status: Home / Self Care | Payer: Medicare PPO | Source: Ambulatory Visit | Attending: Radiation Oncology | Admitting: Radiation Oncology

## 2021-06-08 DIAGNOSIS — C771 Secondary and unspecified malignant neoplasm of intrathoracic lymph nodes: Secondary | ICD-10-CM | POA: Diagnosis not present

## 2021-06-08 DIAGNOSIS — C154 Malignant neoplasm of middle third of esophagus: Secondary | ICD-10-CM | POA: Diagnosis not present

## 2021-06-08 DIAGNOSIS — D6481 Anemia due to antineoplastic chemotherapy: Secondary | ICD-10-CM | POA: Diagnosis not present

## 2021-06-08 DIAGNOSIS — Z87891 Personal history of nicotine dependence: Secondary | ICD-10-CM | POA: Diagnosis not present

## 2021-06-08 DIAGNOSIS — Z923 Personal history of irradiation: Secondary | ICD-10-CM | POA: Diagnosis not present

## 2021-06-08 DIAGNOSIS — Z510 Encounter for antineoplastic radiation therapy: Secondary | ICD-10-CM | POA: Diagnosis not present

## 2021-06-08 DIAGNOSIS — Z85118 Personal history of other malignant neoplasm of bronchus and lung: Secondary | ICD-10-CM | POA: Diagnosis not present

## 2021-06-08 DIAGNOSIS — Z5111 Encounter for antineoplastic chemotherapy: Secondary | ICD-10-CM | POA: Diagnosis not present

## 2021-06-08 DIAGNOSIS — Z9221 Personal history of antineoplastic chemotherapy: Secondary | ICD-10-CM | POA: Diagnosis not present

## 2021-06-09 ENCOUNTER — Encounter: Payer: Self-pay | Admitting: Internal Medicine

## 2021-06-09 ENCOUNTER — Inpatient Hospital Stay: Payer: Medicare PPO

## 2021-06-09 ENCOUNTER — Other Ambulatory Visit: Payer: Self-pay

## 2021-06-09 ENCOUNTER — Inpatient Hospital Stay (HOSPITAL_BASED_OUTPATIENT_CLINIC_OR_DEPARTMENT_OTHER): Payer: Medicare PPO | Admitting: Internal Medicine

## 2021-06-09 ENCOUNTER — Ambulatory Visit
Admission: RE | Admit: 2021-06-09 | Discharge: 2021-06-09 | Disposition: A | Discharge status: Home / Self Care | Payer: Medicare PPO | Source: Ambulatory Visit | Attending: Radiation Oncology | Admitting: Radiation Oncology

## 2021-06-09 DIAGNOSIS — D490 Neoplasm of unspecified behavior of digestive system: Secondary | ICD-10-CM

## 2021-06-09 DIAGNOSIS — D6481 Anemia due to antineoplastic chemotherapy: Secondary | ICD-10-CM | POA: Diagnosis not present

## 2021-06-09 DIAGNOSIS — Z87891 Personal history of nicotine dependence: Secondary | ICD-10-CM | POA: Diagnosis not present

## 2021-06-09 DIAGNOSIS — Z510 Encounter for antineoplastic radiation therapy: Secondary | ICD-10-CM | POA: Diagnosis not present

## 2021-06-09 DIAGNOSIS — Z9221 Personal history of antineoplastic chemotherapy: Secondary | ICD-10-CM | POA: Diagnosis not present

## 2021-06-09 DIAGNOSIS — C154 Malignant neoplasm of middle third of esophagus: Secondary | ICD-10-CM | POA: Diagnosis not present

## 2021-06-09 DIAGNOSIS — Z85118 Personal history of other malignant neoplasm of bronchus and lung: Secondary | ICD-10-CM | POA: Diagnosis not present

## 2021-06-09 DIAGNOSIS — Z5111 Encounter for antineoplastic chemotherapy: Secondary | ICD-10-CM | POA: Diagnosis not present

## 2021-06-09 DIAGNOSIS — C771 Secondary and unspecified malignant neoplasm of intrathoracic lymph nodes: Secondary | ICD-10-CM | POA: Diagnosis not present

## 2021-06-09 DIAGNOSIS — Z923 Personal history of irradiation: Secondary | ICD-10-CM | POA: Diagnosis not present

## 2021-06-09 LAB — COMPREHENSIVE METABOLIC PANEL
ALT: 21 U/L (ref 0–44)
AST: 20 U/L (ref 15–41)
Albumin: 3.8 g/dL (ref 3.5–5.0)
Alkaline Phosphatase: 61 U/L (ref 38–126)
Anion gap: 10 (ref 5–15)
BUN: 22 mg/dL (ref 8–23)
CO2: 26 mmol/L (ref 22–32)
Calcium: 9.2 mg/dL (ref 8.9–10.3)
Chloride: 99 mmol/L (ref 98–111)
Creatinine, Ser: 1.12 mg/dL — ABNORMAL HIGH (ref 0.44–1.00)
GFR, Estimated: 47 mL/min — ABNORMAL LOW (ref >60)
Glucose, Bld: 151 mg/dL — ABNORMAL HIGH (ref 70–99)
Potassium: 4.3 mmol/L (ref 3.5–5.1)
Sodium: 135 mmol/L (ref 135–145)
Total Bilirubin: 0.6 mg/dL (ref 0.3–1.2)
Total Protein: 7 g/dL (ref 6.5–8.1)

## 2021-06-09 LAB — CBC WITH DIFFERENTIAL/PLATELET
Abs Immature Granulocytes: 0.02 10*3/uL (ref 0.00–0.07)
Basophils Absolute: 0 10*3/uL (ref 0.0–0.1)
Basophils Relative: 1 %
Eosinophils Absolute: 0 10*3/uL (ref 0.0–0.5)
Eosinophils Relative: 1 %
HCT: 31.3 % — ABNORMAL LOW (ref 36.0–46.0)
Hemoglobin: 10.5 g/dL — ABNORMAL LOW (ref 12.0–15.0)
Immature Granulocytes: 1 %
Lymphocytes Relative: 31 %
Lymphs Abs: 1.1 10*3/uL (ref 0.7–4.0)
MCH: 29.5 pg (ref 26.0–34.0)
MCHC: 33.5 g/dL (ref 30.0–36.0)
MCV: 87.9 fL (ref 80.0–100.0)
Monocytes Absolute: 0.4 10*3/uL (ref 0.1–1.0)
Monocytes Relative: 10 %
Neutro Abs: 2.2 10*3/uL (ref 1.7–7.7)
Neutrophils Relative %: 56 %
Platelets: 240 10*3/uL (ref 150–400)
RBC: 3.56 MIL/uL — ABNORMAL LOW (ref 3.87–5.11)
RDW: 12.4 % (ref 11.5–15.5)
WBC: 3.7 10*3/uL — ABNORMAL LOW (ref 4.0–10.5)
nRBC: 0 % (ref 0.0–0.2)

## 2021-06-09 MED ORDER — SODIUM CHLORIDE 0.9 % IV SOLN
124.0000 mg | Freq: Once | INTRAVENOUS | Status: AC
  Administered 2021-06-09: 12:00:00 120 mg via INTRAVENOUS
  Filled 2021-06-09: qty 12

## 2021-06-09 MED ORDER — PALONOSETRON HCL INJECTION 0.25 MG/5ML
0.2500 mg | Freq: Once | INTRAVENOUS | Status: AC
  Administered 2021-06-09: 11:00:00 0.25 mg via INTRAVENOUS
  Filled 2021-06-09: qty 5

## 2021-06-09 MED ORDER — SODIUM CHLORIDE 0.9 % IV SOLN
10.0000 mg | Freq: Once | INTRAVENOUS | Status: AC
  Administered 2021-06-09: 11:00:00 10 mg via INTRAVENOUS
  Filled 2021-06-09: qty 1

## 2021-06-09 MED ORDER — SODIUM CHLORIDE 0.9 % IV SOLN
Freq: Once | INTRAVENOUS | Status: AC
Start: 2021-06-09 — End: 2021-06-09
  Filled 2021-06-09: qty 250

## 2021-06-09 MED ORDER — PACLITAXEL PROTEIN-BOUND CHEMO INJECTION 100 MG
100.0000 mg/m2 | Freq: Once | INTRAVENOUS | Status: AC
  Administered 2021-06-09: 12:00:00 175 mg via INTRAVENOUS
  Filled 2021-06-09: qty 35

## 2021-06-09 NOTE — Progress Notes (Signed)
Pt would like to know how many Gatorades she should drink a day?

## 2021-06-09 NOTE — Progress Notes (Signed)
[place] OFFICE PROGRESS NOTE  Patient Care Team: [name], Heather Cooper as PCP - General (Internal Medicine) Clent Jacks, RN as Oncology Nurse Navigator   Cancer Staging  Neoplasm of middle third of esophagus Staging form: Esophagus - Squamous Cell Carcinoma, AJCC 8th Edition - Clinical: Stage Unknown (cTX, cN1, cM0) - Signed by Cammie Sickle, Heather Cooper on 05/10/2021   Oncology History Overview Note  # 2015-patient is an 87 year old female with 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  Starting radiation and chemotherapy from April 14, 2014 Patient was started on carboplatinum andTaxol Herve were developed an allergic reaction to Taxol so would be changed to Abraxane 3.patient has finished 6 cycles of weekly chemotherapy with carboplatinum  and radiation therapy(May 28, 2014) 4.started on  NIVOLULAMAB because of persistent disease July 02, 2014. 5.  NIVOLULAMAB was discontinued because of persistent diarrhea in July of 2016.  August of 2016 CT scan was stable so no further chemotherapy  # AUG 25th PET- STABLE RUL MASS [radiation fibrosis; <1cm ? Mediastinal recurrence];   # DEC 2022-squamous cell carcinoma of the midesophagus -carbo Abraxane weekly with radiation  # AAA/ 3.3x 3.9 Stable [Dr.Schnier] -----------------------------------------------------       History of lung cancer  Malignant neoplasm of right upper lobe of lung ([place])  08/07/2019 Initial Diagnosis   Malignant neoplasm of right upper lobe of lung ([place])    INTERVAL HISTORY: Ambulating with a rolling walker.  Accompanied by her daughter.  Heather Cooper 87 y.o.  female pleasant patient newly  diagnosed squamous cell carcinoma of the midesophagus-locally advanced on definitive chemoradiation is here for follow-up.  Patient is tolerating chemoradiation fairly well.  She had 2 cycles so far.  Denies any significant difficulty swallowing or nausea vomiting.  She has been drinking Gatorade.  Review of Systems  Constitutional:  Negative for chills, diaphoresis, fever, malaise/fatigue and weight loss.  HENT:  Negative for nosebleeds and sore throat.   Eyes:  Negative for double vision.  Respiratory:  Positive for cough. Negative for hemoptysis, sputum production, shortness of breath and wheezing.   Cardiovascular:  Negative for chest pain, palpitations, orthopnea and leg swelling.  Gastrointestinal:  Negative for abdominal pain, blood in stool, constipation, diarrhea, heartburn, melena, nausea and vomiting.  Genitourinary:  Negative for dysuria, frequency and urgency.  Musculoskeletal:  Negative for back pain and joint pain.  Skin: Negative.  Negative for itching and rash.  Neurological:  Negative for dizziness, tingling, focal weakness, weakness and headaches.  Endo/Heme/Allergies:  Does not bruise/bleed easily.  Psychiatric/Behavioral:  Negative for depression. The patient is not nervous/anxious and does not have insomnia.     PAST MEDICAL HISTORY :  Past Medical History:  Diagnosis Date   Chemotherapy induced nausea and vomiting    Chicken pox    Esophageal cancer (HCC)    Hypercalcemia    familial hypocalciuric hypercalcemia   Hypercholesterolemia    Hypertension    Lung cancer ([place])    Osteoporosis    Thyroid disease     PAST SURGICAL HISTORY :   Past Surgical History:  Procedure Laterality Date   ABDOMINAL HYSTERECTOMY     partial   CHOLECYSTECTOMY  COLONOSCOPY WITH PROPOFOL N/A 04/17/2017   Procedure: COLONOSCOPY WITH PROPOFOL;  Surgeon: Manya Silvas, Heather Cooper;  Location: [facility] ENDOSCOPY;  Service: Endoscopy;  Laterality: N/A;   ESOPHAGOGASTRODUODENOSCOPY (EGD)  WITH PROPOFOL N/A 04/13/2021   Procedure: ESOPHAGOGASTRODUODENOSCOPY (EGD) WITH PROPOFOL;  Surgeon: [name], Heather Cooper;  Location: [facility] ENDOSCOPY;  Service: Endoscopy;  Laterality: N/A;   EUS N/A 04/22/2021   Procedure: FULL UPPER ENDOSCOPIC ULTRASOUND (EUS) RADIAL;  Surgeon: Jola Schmidt, Heather Cooper;  Location: [facility] ENDOSCOPY;  Service: Endoscopy;  Laterality: N/A;  [facility] needed   transvaginal hysterectomy  04/18/05   with anterior colporrhaphy    FAMILY HISTORY :   Family History  Problem Relation Age of Onset   Stroke Mother    Hypertension Mother    Prostate cancer Father    Cancer Father        prostate   Heart disease Brother        s/p CABG   Colon cancer Neg Hx     SOCIAL HISTORY:   Social History   Tobacco Use   Smoking status: Former    Types: Cigarettes    Quit date: 06/13/1997    Years since quitting: 24.0   Smokeless tobacco: Never  Vaping Use   Vaping Use: Never used  Substance Use Topics   Alcohol use: No    Alcohol/week: 0.0 standard drinks   Drug use: No    ALLERGIES:  is allergic to paclitaxel.  MEDICATIONS:  Current Outpatient Medications  Medication Sig Dispense Refill   cholecalciferol (VITAMIN D3) 25 MCG (1000 UNIT) tablet Take 1,000 Units by mouth daily.     dexamethasone (DECADRON) 4 MG tablet Take 2 tablets (8 mg total) by mouth daily. Start the day after chemotherapy for 2 days. 30 tablet 1   fluticasone (FLONASE) 50 MCG/ACT nasal spray Place 2 sprays into both nostrils daily. 16 g 3   levocetirizine (XYZAL) 5 MG tablet Take 1 tablet (5 mg total) by mouth every evening. 90 tablet 2   LORazepam (ATIVAN) 0.5 MG tablet Take 1 tablet (0.5 mg total) by mouth every 6 (six) hours as needed (Nausea or vomiting). 30 tablet 0   olmesartan-hydrochlorothiazide (BENICAR HCT) 20-12.5 MG tablet TAKE 1 TABLET BY MOUTH ONCE DAILY 90 tablet 1   ondansetron (ZOFRAN) 8 MG tablet Take 1 tablet (8 mg total) by mouth 2 (two) times daily as needed for refractory nausea /  vomiting. Start on day 3 after chemo. 30 tablet 1   pantoprazole (PROTONIX) 40 MG tablet TAKE 1 TABLET BY MOUTH ONCE DAILY 90 tablet 3   prochlorperazine (COMPAZINE) 10 MG tablet Take 1 tablet (10 mg total) by mouth every 6 (six) hours as needed (Nausea or vomiting). 30 tablet 1   Ascorbic Acid (VITAMIN C) 1000 MG tablet Take 1,000 mg by mouth daily. (Patient not taking: Reported on 06/01/2021)     No current facility-administered medications for this visit.   Facility-Administered Medications Ordered in Other Visits  Medication Dose Route Frequency Provider Last Rate Last Admin   CARBOplatin (PARAPLATIN) 120 mg in sodium chloride 0.9 % 100 mL chemo infusion  120 mg Intravenous Once Heather Jaquith Cooper, Heather Cooper       sodium chloride 0.9 % 1,000 mL with potassium chloride 20 mEq, magnesium sulfate 2 g infusion   Intravenous Continuous Forest Gleason, Heather Cooper   Stopped at 12/04/14 1300    PHYSICAL EXAMINATION: ECOG PERFORMANCE STATUS: 0 - Asymptomatic  BP (!) 111/55 (BP Location: Right Arm, Patient Position: Sitting, Cuff Size: Normal)  Pulse 87    Temp 98.2 F (36.8 C) (Tympanic)    Ht _0  (1.6 m)    Wt 144 lb 9.6 oz (65.6 kg)    SpO2 100%    BMI 25.61 kg/m   Filed Weights   06/09/21 1022  Weight: 144 lb 9.6 oz (65.6 kg)    Physical Exam HENT:     Head: Normocephalic and atraumatic.     Mouth/Throat:     Pharynx: No oropharyngeal exudate.  Eyes:     Pupils: Pupils are equal, round, and reactive to light.  Cardiovascular:     Rate and Rhythm: Normal rate and regular rhythm.  Pulmonary:     Effort: Pulmonary effort is normal. No respiratory distress.     Breath sounds: Normal breath sounds. No wheezing.  Abdominal:     General: Bowel sounds are normal. There is no distension.     Palpations: Abdomen is soft. There is no mass.     Tenderness: There is no abdominal tenderness. There is no guarding or rebound.  Musculoskeletal:        General: No tenderness. Normal range of motion.      Cervical back: Normal range of motion and neck supple.  Skin:    General: Skin is warm.  Neurological:     Mental Status: She is alert and oriented to person, place, and time.  Psychiatric:        Mood and Affect: Affect normal.     LABORATORY DATA:  I have reviewed the data as listed    Component Value Date/Time   NA 135 06/09/2021 0901   NA 130 (L) 10/09/2014 0846   K 4.3 06/09/2021 0901   K 4.1 10/09/2014 0846   CL 99 06/09/2021 0901   CL 98 (L) 10/09/2014 0846   CO2 26 06/09/2021 0901   CO2 25 10/09/2014 0846   GLUCOSE 151 (H) 06/09/2021 0901   GLUCOSE 161 (H) 10/09/2014 0846   BUN 22 06/09/2021 0901   BUN 14 10/09/2014 0846   CREATININE 1.12 (H) 06/09/2021 0901   CREATININE 0.70 10/09/2014 0846   CALCIUM 9.2 06/09/2021 0901   CALCIUM 9.1 10/09/2014 0846   PROT 7.0 06/09/2021 0901   PROT 7.3 10/09/2014 0846   ALBUMIN 3.8 06/09/2021 0901   ALBUMIN 3.6 10/09/2014 0846   AST 20 06/09/2021 0901   AST 16 10/09/2014 0846   ALT 21 06/09/2021 0901   ALT 13 (L) 10/09/2014 0846   ALKPHOS 61 06/09/2021 0901   ALKPHOS 70 10/09/2014 0846   BILITOT 0.6 06/09/2021 0901   BILITOT 1.0 10/09/2014 0846   GFRNONAA 47 (L) 06/09/2021 0901   GFRNONAA >60 10/09/2014 0846   GFRAA >60 02/05/2020 1004   GFRAA >60 10/09/2014 0846    No results found for: SPEP, UPEP  Lab Results  Component Value Date   WBC 3.7 (L) 06/09/2021   NEUTROABS 2.2 06/09/2021   HGB 10.5 (L) 06/09/2021   HCT 31.3 (L) 06/09/2021   MCV 87.9 06/09/2021   PLT 240 06/09/2021      Chemistry      Component Value Date/Time   NA 135 06/09/2021 0901   NA 130 (L) 10/09/2014 0846   K 4.3 06/09/2021 0901   K 4.1 10/09/2014 0846   CL 99 06/09/2021 0901   CL 98 (L) 10/09/2014 0846   CO2 26 06/09/2021 0901   CO2 25 10/09/2014 0846   BUN 22 06/09/2021 0901   BUN 14 10/09/2014 0846   CREATININE 1.12 (H)  06/09/2021 0901   CREATININE 0.70 10/09/2014 0846   GLU 111 03/18/2014 1048      Component Value  Date/Time   CALCIUM 9.2 06/09/2021 0901   CALCIUM 9.1 10/09/2014 0846   ALKPHOS 61 06/09/2021 0901   ALKPHOS 70 10/09/2014 0846   AST 20 06/09/2021 0901   AST 16 10/09/2014 0846   ALT 21 06/09/2021 0901   ALT 13 (L) 10/09/2014 0846   BILITOT 0.6 06/09/2021 0901   BILITOT 1.0 10/09/2014 0846       RADIOGRAPHIC STUDIES: I have personally reviewed the radiological images as listed and agreed with the findings in the report. No results found.    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  Currently on concurrent chemoradiation-for locally advanced squamous cell cancer of the esophagus.  #Currently s/p 2 weekly treatments of Carbo-Abraxane with radiation.  Proceed with cycle #3 [plan a total of 6- until jan 18th; RT until Jan 2th] today labs today reviewed;  acceptable for treatment today.   # Mild  Anemia- improved- Hb 10.2- sec to chemo-STABLE.   #Hyponatremia mild at 133-improving on Gatorade.;  Renal function- GFR-50s- monitor for now-continue increased hydration with Gatorade.  # FBG- 151-  Monitor closely on chemotherapy.  # DISPOSITION:  # chemo today # in 1 week- labs- cbc/cmp;carbo-Abraxane # Follow up in 2 weeks- Heather Cooper; labs- cbc/cmp; carbo-abraxane-Dr.B  Cc; Dr.Scott    No orders of the defined types were placed in this encounter.   All questions were answered. The patient knows to call the clinic with any problems, questions or concerns.      Cammie Sickle, Heather Cooper 06/09/2021 12:17 PM

## 2021-06-09 NOTE — Assessment & Plan Note (Addendum)
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  Currently on concurrent chemoradiation-for locally advanced squamous cell cancer of the esophagus.  #Currently s/p 2 weekly treatments of Carbo-Abraxane with radiation.  Proceed with cycle #3 [plan a total of 6- until jan 18th; RT until Jan 2th] today labs today reviewed;  acceptable for treatment today.   # Mild  Anemia- improved- Hb 10.2- sec to chemo-STABLE.   #Hyponatremia mild at 133-improving on Gatorade.;  Renal function- GFR-50s- monitor for now-continue increased hydration with Gatorade.  # FBG- 151-  Monitor closely on chemotherapy.  # DISPOSITION:  # chemo today # in 1 week- labs- cbc/cmp;carbo-Abraxane # Follow up in 2 weeks- MD; labs- cbc/cmp; carbo-abraxane-Dr.B  Cc; Dr.Scott

## 2021-06-10 ENCOUNTER — Ambulatory Visit
Admission: RE | Admit: 2021-06-10 | Discharge: 2021-06-10 | Disposition: A | Discharge status: Home / Self Care | Payer: Medicare PPO | Source: Ambulatory Visit | Attending: Radiation Oncology | Admitting: Radiation Oncology

## 2021-06-10 ENCOUNTER — Inpatient Hospital Stay: Payer: Medicare PPO

## 2021-06-10 DIAGNOSIS — C771 Secondary and unspecified malignant neoplasm of intrathoracic lymph nodes: Secondary | ICD-10-CM | POA: Diagnosis not present

## 2021-06-10 DIAGNOSIS — Z510 Encounter for antineoplastic radiation therapy: Secondary | ICD-10-CM | POA: Diagnosis not present

## 2021-06-10 DIAGNOSIS — Z923 Personal history of irradiation: Secondary | ICD-10-CM | POA: Diagnosis not present

## 2021-06-10 DIAGNOSIS — Z85118 Personal history of other malignant neoplasm of bronchus and lung: Secondary | ICD-10-CM | POA: Diagnosis not present

## 2021-06-10 DIAGNOSIS — C154 Malignant neoplasm of middle third of esophagus: Secondary | ICD-10-CM | POA: Diagnosis not present

## 2021-06-10 DIAGNOSIS — Z5111 Encounter for antineoplastic chemotherapy: Secondary | ICD-10-CM | POA: Diagnosis not present

## 2021-06-10 DIAGNOSIS — Z87891 Personal history of nicotine dependence: Secondary | ICD-10-CM | POA: Diagnosis not present

## 2021-06-10 DIAGNOSIS — D6481 Anemia due to antineoplastic chemotherapy: Secondary | ICD-10-CM | POA: Diagnosis not present

## 2021-06-10 DIAGNOSIS — Z9221 Personal history of antineoplastic chemotherapy: Secondary | ICD-10-CM | POA: Diagnosis not present

## 2021-06-10 NOTE — Progress Notes (Signed)
Nutrition Follow-up:  Patient with esophageal cancer.  Patient receiving palliative chemotherapy and radiation.    Met with patient during infusion.  Patient reports that she is still eating.  Ate egg, thin pork chop with gravy and water this am before treatment.  Has been trying to eat balanced small meals frequently during the day.  Drinking 1 ensure plus daily.  Also drinking gatorade.    Denies trouble swallowing, diarrhea or nausea    Medications: reviewed  Labs: glucose 151, creatinine 1.12  Anthropometrics:   Weight 144 lb 9.6 oz on 12/28  146 lb on 12/20 149 lb 12/ oz on 12/12 148 lb on 12/1 152 lb on 8/26   NUTRITION DIAGNOSIS: Inadequate oral intake stable   INTERVENTION:  Encouraged patient to increase ensure plus to BID if possible Reviewed ways to add calories (gravy, butter, whole milk, etc) Encouraged hydration    MONITORING, EVALUATION, GOAL: weight trends, intake   NEXT VISIT: Wednesday, Jan 4 during infusion  Arlander Gillen B. Zenia Resides, [place], [place] Registered Dietitian [phone] (mobile)

## 2021-06-11 ENCOUNTER — Ambulatory Visit
Admission: RE | Admit: 2021-06-11 | Discharge: 2021-06-11 | Disposition: A | Discharge status: Home / Self Care | Payer: Medicare PPO | Source: Ambulatory Visit | Attending: Radiation Oncology | Admitting: Radiation Oncology

## 2021-06-11 DIAGNOSIS — C154 Malignant neoplasm of middle third of esophagus: Secondary | ICD-10-CM | POA: Diagnosis not present

## 2021-06-11 DIAGNOSIS — Z85118 Personal history of other malignant neoplasm of bronchus and lung: Secondary | ICD-10-CM | POA: Diagnosis not present

## 2021-06-11 DIAGNOSIS — D6481 Anemia due to antineoplastic chemotherapy: Secondary | ICD-10-CM | POA: Diagnosis not present

## 2021-06-11 DIAGNOSIS — Z510 Encounter for antineoplastic radiation therapy: Secondary | ICD-10-CM | POA: Diagnosis not present

## 2021-06-11 DIAGNOSIS — Z923 Personal history of irradiation: Secondary | ICD-10-CM | POA: Diagnosis not present

## 2021-06-11 DIAGNOSIS — C771 Secondary and unspecified malignant neoplasm of intrathoracic lymph nodes: Secondary | ICD-10-CM | POA: Diagnosis not present

## 2021-06-11 DIAGNOSIS — Z87891 Personal history of nicotine dependence: Secondary | ICD-10-CM | POA: Diagnosis not present

## 2021-06-11 DIAGNOSIS — Z9221 Personal history of antineoplastic chemotherapy: Secondary | ICD-10-CM | POA: Diagnosis not present

## 2021-06-11 DIAGNOSIS — Z5111 Encounter for antineoplastic chemotherapy: Secondary | ICD-10-CM | POA: Diagnosis not present

## 2021-06-15 ENCOUNTER — Ambulatory Visit
Admission: RE | Admit: 2021-06-15 | Discharge: 2021-06-15 | Disposition: A | Discharge status: Home / Self Care | Payer: Medicare PPO | Source: Ambulatory Visit | Attending: Radiation Oncology | Admitting: Radiation Oncology

## 2021-06-15 DIAGNOSIS — C154 Malignant neoplasm of middle third of esophagus: Secondary | ICD-10-CM | POA: Insufficient documentation

## 2021-06-15 DIAGNOSIS — C771 Secondary and unspecified malignant neoplasm of intrathoracic lymph nodes: Secondary | ICD-10-CM | POA: Insufficient documentation

## 2021-06-15 DIAGNOSIS — Z87891 Personal history of nicotine dependence: Secondary | ICD-10-CM | POA: Diagnosis not present

## 2021-06-16 ENCOUNTER — Other Ambulatory Visit: Payer: Self-pay | Admitting: Internal Medicine

## 2021-06-16 ENCOUNTER — Inpatient Hospital Stay: Payer: Medicare PPO

## 2021-06-16 ENCOUNTER — Ambulatory Visit
Admission: RE | Admit: 2021-06-16 | Discharge: 2021-06-16 | Disposition: A | Discharge status: Home / Self Care | Payer: Medicare PPO | Source: Ambulatory Visit | Attending: Radiation Oncology | Admitting: Radiation Oncology

## 2021-06-16 ENCOUNTER — Other Ambulatory Visit: Payer: Self-pay

## 2021-06-16 ENCOUNTER — Inpatient Hospital Stay: Payer: Medicare PPO | Attending: Internal Medicine

## 2021-06-16 VITALS — BP 117/55 | HR 86 | Temp 97.3°F | Resp 17 | Wt 143.4 lb

## 2021-06-16 DIAGNOSIS — Z5111 Encounter for antineoplastic chemotherapy: Secondary | ICD-10-CM | POA: Diagnosis not present

## 2021-06-16 DIAGNOSIS — N179 Acute kidney failure, unspecified: Secondary | ICD-10-CM | POA: Diagnosis not present

## 2021-06-16 DIAGNOSIS — D490 Neoplasm of unspecified behavior of digestive system: Secondary | ICD-10-CM

## 2021-06-16 DIAGNOSIS — Z87891 Personal history of nicotine dependence: Secondary | ICD-10-CM | POA: Diagnosis not present

## 2021-06-16 DIAGNOSIS — C154 Malignant neoplasm of middle third of esophagus: Secondary | ICD-10-CM | POA: Insufficient documentation

## 2021-06-16 DIAGNOSIS — Z79899 Other long term (current) drug therapy: Secondary | ICD-10-CM | POA: Diagnosis not present

## 2021-06-16 DIAGNOSIS — D649 Anemia, unspecified: Secondary | ICD-10-CM | POA: Insufficient documentation

## 2021-06-16 DIAGNOSIS — N390 Urinary tract infection, site not specified: Secondary | ICD-10-CM | POA: Insufficient documentation

## 2021-06-16 LAB — COMPREHENSIVE METABOLIC PANEL
ALT: 20 U/L (ref 0–44)
AST: 18 U/L (ref 15–41)
Albumin: 3.7 g/dL (ref 3.5–5.0)
Alkaline Phosphatase: 62 U/L (ref 38–126)
Anion gap: 9 (ref 5–15)
BUN: 38 mg/dL — ABNORMAL HIGH (ref 8–23)
CO2: 25 mmol/L (ref 22–32)
Calcium: 8.8 mg/dL — ABNORMAL LOW (ref 8.9–10.3)
Chloride: 99 mmol/L (ref 98–111)
Creatinine, Ser: 1.4 mg/dL — ABNORMAL HIGH (ref 0.44–1.00)
GFR, Estimated: 36 mL/min — ABNORMAL LOW (ref >60)
Glucose, Bld: 167 mg/dL — ABNORMAL HIGH (ref 70–99)
Potassium: 4.3 mmol/L (ref 3.5–5.1)
Sodium: 133 mmol/L — ABNORMAL LOW (ref 135–145)
Total Bilirubin: 0.5 mg/dL (ref 0.3–1.2)
Total Protein: 7 g/dL (ref 6.5–8.1)

## 2021-06-16 LAB — CBC WITH DIFFERENTIAL/PLATELET
Abs Immature Granulocytes: 0.05 10*3/uL (ref 0.00–0.07)
Basophils Absolute: 0 10*3/uL (ref 0.0–0.1)
Basophils Relative: 0 %
Eosinophils Absolute: 0 10*3/uL (ref 0.0–0.5)
Eosinophils Relative: 0 %
HCT: 29.7 % — ABNORMAL LOW (ref 36.0–46.0)
Hemoglobin: 10.3 g/dL — ABNORMAL LOW (ref 12.0–15.0)
Immature Granulocytes: 1 %
Lymphocytes Relative: 24 %
Lymphs Abs: 1.2 10*3/uL (ref 0.7–4.0)
MCH: 30.3 pg (ref 26.0–34.0)
MCHC: 34.7 g/dL (ref 30.0–36.0)
MCV: 87.4 fL (ref 80.0–100.0)
Monocytes Absolute: 0.3 10*3/uL (ref 0.1–1.0)
Monocytes Relative: 5 %
Neutro Abs: 3.4 10*3/uL (ref 1.7–7.7)
Neutrophils Relative %: 70 %
Platelets: 182 10*3/uL (ref 150–400)
RBC: 3.4 MIL/uL — ABNORMAL LOW (ref 3.87–5.11)
RDW: 12.7 % (ref 11.5–15.5)
WBC: 4.9 10*3/uL (ref 4.0–10.5)
nRBC: 0 % (ref 0.0–0.2)

## 2021-06-16 MED ORDER — SODIUM CHLORIDE 0.9 % IV SOLN
INTRAVENOUS | Status: DC
  Filled 2021-06-16 (×2): qty 250

## 2021-06-16 MED ORDER — DIPHENOXYLATE-ATROPINE 2.5-0.025 MG PO TABS: 1.0000 | ORAL_TABLET | Freq: Four times a day (QID) | ORAL | 0 refills | Status: DC | PRN

## 2021-06-16 NOTE — Progress Notes (Signed)
Nutrition Follow-up:  Patient with esophageal cancer.  Patient receiving palliative chemotherapy and radiation.  Holding chemotherapy today and giving IV fluids.  Met with patient during infusion.  Patient reports that she ate egg with spinach this am, toast with jelly, drank and ensure and water.  Lunch before coming was chicken noodle soup with crackers and water and gatorade.  Has been drinking 1-2 ensure per day.    Noted loose stool but patient did not report this to RD.  Medications: reviewed Noted MD stopping benicar  Labs: BUN 38, creatinine 1.40, Na 133, glucose 167  Anthropometrics:   Weight 143 lb 6 oz today  144 lb 9.6 oz 146 lb on 12/20 149 lb on 12/12 148 lb on 12/1 152 lb on 8/26   NUTRITION DIAGNOSIS: Inadequate oral intake stable   INTERVENTION:  Continue ensure 1-2 per day Continue high calorie, high protein foods Encouraged hydration Noted patient coming tomorrow for [facility] evaluation and more possible fluids  MONITORING, EVALUATION, GOAL: weight trends, intake   NEXT VISIT: Wednesday, Jan 11 during infusion  Jenavee Laguardia B. Zenia Resides, [place], [place] Registered Dietitian [phone] (mobile)

## 2021-06-16 NOTE — Patient Instructions (Signed)
Hold Benicar until instructed otherwise.  Pick up Lomotil from pharmacy for "loose stool's." See NP at symptom management clinic on 06/17/21 for possible IV fluids.

## 2021-06-16 NOTE — Progress Notes (Signed)
pt here for Abraxane and Carboplatin, B/P 93/45 HR 98, Creatinine 1.4, BUN 38. pt does take Benicar HCT 20-12.5 mg tablets (one tablet) every morning.  she is having 2 loose stools a day and 1 time over night and she took an imodium this morning. Pt also reports being "light headed this morning but not now".  MD aware. Per Dr. Rogue Bussing hold chemo, pt to hold Benicar, Pt to receive one liter NS over one hour and been seen in [facility] clinic 06/17/21 for evaluation and possible IVFs, and MD will call in lomotil. Pt aware and verbalizes understanding.   1510: Pt stable at discharge. Plan reviewed with patient's daughter. Written and verbal instructions given to pt and patient's daughter, both verbalize understanding.

## 2021-06-17 ENCOUNTER — Inpatient Hospital Stay (HOSPITAL_BASED_OUTPATIENT_CLINIC_OR_DEPARTMENT_OTHER): Payer: Medicare PPO | Admitting: Hospice and Palliative Medicine

## 2021-06-17 ENCOUNTER — Inpatient Hospital Stay: Payer: Medicare PPO

## 2021-06-17 ENCOUNTER — Ambulatory Visit
Admission: RE | Admit: 2021-06-17 | Discharge: 2021-06-17 | Disposition: A | Discharge status: Home / Self Care | Payer: Medicare PPO | Source: Ambulatory Visit | Attending: Radiation Oncology | Admitting: Radiation Oncology

## 2021-06-17 VITALS — BP 126/59 | HR 90 | Temp 98.4°F | Resp 16 | Wt 145.1 lb

## 2021-06-17 DIAGNOSIS — E860 Dehydration: Secondary | ICD-10-CM

## 2021-06-17 DIAGNOSIS — C3411 Malignant neoplasm of upper lobe, right bronchus or lung: Secondary | ICD-10-CM

## 2021-06-17 DIAGNOSIS — Z5111 Encounter for antineoplastic chemotherapy: Secondary | ICD-10-CM | POA: Diagnosis not present

## 2021-06-17 DIAGNOSIS — C155 Malignant neoplasm of lower third of esophagus: Secondary | ICD-10-CM | POA: Diagnosis not present

## 2021-06-17 DIAGNOSIS — Z79899 Other long term (current) drug therapy: Secondary | ICD-10-CM | POA: Diagnosis not present

## 2021-06-17 DIAGNOSIS — N179 Acute kidney failure, unspecified: Secondary | ICD-10-CM | POA: Diagnosis not present

## 2021-06-17 DIAGNOSIS — C771 Secondary and unspecified malignant neoplasm of intrathoracic lymph nodes: Secondary | ICD-10-CM | POA: Diagnosis not present

## 2021-06-17 DIAGNOSIS — Z87891 Personal history of nicotine dependence: Secondary | ICD-10-CM | POA: Diagnosis not present

## 2021-06-17 DIAGNOSIS — N390 Urinary tract infection, site not specified: Secondary | ICD-10-CM | POA: Diagnosis not present

## 2021-06-17 DIAGNOSIS — D649 Anemia, unspecified: Secondary | ICD-10-CM | POA: Diagnosis not present

## 2021-06-17 DIAGNOSIS — R300 Dysuria: Secondary | ICD-10-CM | POA: Diagnosis not present

## 2021-06-17 DIAGNOSIS — C154 Malignant neoplasm of middle third of esophagus: Secondary | ICD-10-CM | POA: Diagnosis not present

## 2021-06-17 LAB — URINALYSIS, COMPLETE (UACMP) WITH MICROSCOPIC
Bilirubin Urine: NEGATIVE
Glucose, UA: NEGATIVE mg/dL
Hgb urine dipstick: NEGATIVE
Ketones, ur: NEGATIVE mg/dL
Nitrite: NEGATIVE
Protein, ur: 30 mg/dL — AB
Specific Gravity, Urine: 1.014 (ref 1.005–1.030)
WBC, UA: >50 WBC/hpf — ABNORMAL HIGH (ref 0–5)
pH: 5 (ref 5.0–8.0)

## 2021-06-17 MED ORDER — SODIUM CHLORIDE 0.9 % IV SOLN
INTRAVENOUS | Status: DC
  Filled 2021-06-17 (×2): qty 250

## 2021-06-17 MED ORDER — AMOXICILLIN 500 MG PO CAPS: 500.0000 mg | ORAL_CAPSULE | Freq: Two times a day (BID) | ORAL | 0 refills | Status: DC

## 2021-06-17 NOTE — Progress Notes (Signed)
Better today. No weakness or dizziness. Pt eating and drinking. Pt received 500 ml NS over 1 hour today. Discharged today. Accompanied by son in law . Pt will go to radiation now.

## 2021-06-17 NOTE — Progress Notes (Signed)
Pt states that she urinates a lot at night and has pressure at the end of urination. She got fluids yest. Because she was hypotension yest. She got fluids. Today she does not feel hypotensive and b/p good today

## 2021-06-17 NOTE — Progress Notes (Addendum)
Symptom Management [place] at [facility] [phone] [phone]  Patient Care Team: [name], MD as PCP - General (Internal Medicine) Clent Jacks, RN as Oncology Nurse Navigator   Name of the patient: Heather Cooper  [id number]  04-06-1931   Date of visit: 06/17/21  Reason for Consult:  Heather Cooper is an 88 year old woman with multiple medical problems including locally advanced squamous cell carcinoma of esophagus on chemoradiation with weekly Abraxane/carboplatin.  Patient last saw Dr. Rogue Bussing on 06/09/2021 at which time she received cycle 3 carbo Abraxane.  Patient returned yesterday for cycle 4 chemotherapy but was found to be somewhat hypotensive and labs consistent with dehydration.  Chemo was held and patient instead received IV fluids.  She returns today to [facility] for recheck of symptoms.  Today, she reports feeling much improved.  She had some loose stool yesterday but says that that has resolved.  She is eating and drinking more.  She does endorse some urinary "pressure" associated with frequency and some urgency and thinks she might have a "UTI".  Denies any neurologic complaints. Denies recent fevers or illnesses. Denies any easy bleeding or bruising. Reports good appetite and denies weight loss. Denies chest pain. Denies any nausea, vomiting, constipation, or diarrhea. Patient offers no further specific complaints today.  PAST MEDICAL HISTORY: Past Medical History:  Diagnosis Date   Chemotherapy induced nausea and vomiting    Chicken pox    Esophageal cancer (HCC)    Hypercalcemia    familial hypocalciuric hypercalcemia   Hypercholesterolemia    Hypertension    Lung cancer ([place])    Osteoporosis    Thyroid disease     PAST SURGICAL HISTORY:  Past Surgical History:  Procedure Laterality Date   ABDOMINAL HYSTERECTOMY     partial   CHOLECYSTECTOMY     COLONOSCOPY WITH PROPOFOL N/A 04/17/2017    Procedure: COLONOSCOPY WITH PROPOFOL;  Surgeon: Manya Silvas, MD;  Location: [facility] ENDOSCOPY;  Service: Endoscopy;  Laterality: N/A;   ESOPHAGOGASTRODUODENOSCOPY (EGD) WITH PROPOFOL N/A 04/13/2021   Procedure: ESOPHAGOGASTRODUODENOSCOPY (EGD) WITH PROPOFOL;  Surgeon: [name], MD;  Location: [facility] ENDOSCOPY;  Service: Endoscopy;  Laterality: N/A;   EUS N/A 04/22/2021   Procedure: FULL UPPER ENDOSCOPIC ULTRASOUND (EUS) RADIAL;  Surgeon: Jola Schmidt, MD;  Location: [facility] ENDOSCOPY;  Service: Endoscopy;  Laterality: N/A;  [facility] needed   transvaginal hysterectomy  04/18/05   with anterior colporrhaphy    HEMATOLOGY/ONCOLOGY HISTORY:  Oncology History Overview Note  # 2015-patient is an 88 year old female with 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  Starting radiation and chemotherapy from April 14, 2014 Patient was started on carboplatinum andTaxol Herve were developed an allergic reaction to Taxol so would be changed to Abraxane 3.patient has finished 6 cycles of weekly chemotherapy with carboplatinum  and radiation therapy(May 28, 2014) 4.started on  NIVOLULAMAB because of persistent disease July 02, 2014. 5.  NIVOLULAMAB was discontinued because of persistent diarrhea in July of 2016.  August of 2016 CT scan was stable so no further chemotherapy  # AUG 25th PET- STABLE RUL MASS [radiation fibrosis; <1cm ? Mediastinal recurrence];   # DEC 2022-squamous cell carcinoma of the midesophagus -  carbo Abraxane weekly with radiation  # AAA/ 3.3x 3.9 Stable [Dr.Schnier] -----------------------------------------------------       History of lung cancer  Malignant neoplasm of right upper  lobe of lung ([place])  08/07/2019 Initial Diagnosis   Malignant neoplasm of right upper lobe of lung (HCC)     ALLERGIES:  is allergic to paclitaxel.  MEDICATIONS:  Current Outpatient Medications  Medication Sig Dispense Refill   Ascorbic Acid (VITAMIN C) 1000 MG tablet Take 1,000 mg by mouth daily. (Patient not taking: Reported on 06/01/2021)     cholecalciferol (VITAMIN D3) 25 MCG (1000 UNIT) tablet Take 1,000 Units by mouth daily.     dexamethasone (DECADRON) 4 MG tablet Take 2 tablets (8 mg total) by mouth daily. Start the day after chemotherapy for 2 days. 30 tablet 1   diphenoxylate-atropine (LOMOTIL) 2.5-0.025 MG tablet Take 1 tablet by mouth 4 (four) times daily as needed for diarrhea or loose stools. Take it along with immodium 30 tablet 0   fluticasone (FLONASE) 50 MCG/ACT nasal spray Place 2 sprays into both nostrils daily. 16 g 3   levocetirizine (XYZAL) 5 MG tablet Take 1 tablet (5 mg total) by mouth every evening. 90 tablet 2   LORazepam (ATIVAN) 0.5 MG tablet Take 1 tablet (0.5 mg total) by mouth every 6 (six) hours as needed (Nausea or vomiting). 30 tablet 0   olmesartan-hydrochlorothiazide (BENICAR HCT) 20-12.5 MG tablet TAKE 1 TABLET BY MOUTH ONCE DAILY 90 tablet 1   ondansetron (ZOFRAN) 8 MG tablet Take 1 tablet (8 mg total) by mouth 2 (two) times daily as needed for refractory nausea / vomiting. Start on day 3 after chemo. 30 tablet 1   pantoprazole (PROTONIX) 40 MG tablet TAKE 1 TABLET BY MOUTH ONCE DAILY 90 tablet 3   prochlorperazine (COMPAZINE) 10 MG tablet Take 1 tablet (10 mg total) by mouth every 6 (six) hours as needed (Nausea or vomiting). 30 tablet 1   No current facility-administered medications for this visit.   Facility-Administered Medications Ordered in Other Visits  Medication Dose Route Frequency Provider Last Rate Last Admin   sodium chloride 0.9 % 1,000 mL with potassium chloride 20 mEq, magnesium sulfate 2 g infusion   Intravenous Continuous Forest Gleason, MD   Stopped at 12/04/14 1300    VITAL SIGNS: There were no vitals taken for this visit. There were no vitals filed for this visit.  Estimated body mass index is 25.4 kg/m as calculated from the following:   Height as of 06/09/21: _0  (1.6 m).   Weight as of 06/16/21: 143 lb 6 oz (65 kg).  LABS: CBC:    Component Value Date/Time   WBC 4.9 06/16/2021 1249   HGB 10.3 (L) 06/16/2021 1249   HGB 10.5 (L) 10/09/2014 0846   HCT 29.7 (L) 06/16/2021 1249   HCT 30.7 (L) 10/09/2014 0846   PLT 182 06/16/2021 1249   PLT 218 10/09/2014 0846   MCV 87.4 06/16/2021 1249   MCV 85 10/09/2014 0846   NEUTROABS 3.4 06/16/2021 1249   NEUTROABS 9.7 (H) 10/09/2014 0846   LYMPHSABS 1.2 06/16/2021 1249   LYMPHSABS 0.5 (L) 10/09/2014 0846   MONOABS 0.3 06/16/2021 1249   MONOABS 0.8 10/09/2014 0846   EOSABS 0.0 06/16/2021 1249   EOSABS 0.1 10/09/2014 0846   BASOSABS 0.0 06/16/2021 1249   BASOSABS 0.0 10/09/2014 0846   Comprehensive Metabolic Panel:    Component Value Date/Time   NA 133 (L) 06/16/2021 1249   NA 130 (L) 10/09/2014 2751  K 4.3 06/16/2021 1249   K 4.1 10/09/2014 0846   CL 99 06/16/2021 1249   CL 98 (L) 10/09/2014 0846   CO2 25 06/16/2021 1249   CO2 25 10/09/2014 0846   BUN 38 (H) 06/16/2021 1249   BUN 14 10/09/2014 0846   CREATININE 1.40 (H) 06/16/2021 1249   CREATININE 0.70 10/09/2014 0846   GLUCOSE 167 (H) 06/16/2021 1249   GLUCOSE 161 (H) 10/09/2014 0846   CALCIUM 8.8 (L) 06/16/2021 1249   CALCIUM 9.1 10/09/2014 0846   AST 18 06/16/2021 1249   AST 16 10/09/2014 0846   ALT 20 06/16/2021 1249   ALT 13 (L) 10/09/2014 0846   ALKPHOS 62 06/16/2021 1249   ALKPHOS 70 10/09/2014 0846   BILITOT 0.5 06/16/2021 1249   BILITOT 1.0 10/09/2014 0846   PROT 7.0 06/16/2021 1249   PROT 7.3 10/09/2014 0846   ALBUMIN 3.7 06/16/2021 1249   ALBUMIN 3.6 10/09/2014 0846    RADIOGRAPHIC STUDIES: No results found.  PERFORMANCE STATUS (ECOG) : 1 - Symptomatic but completely  ambulatory  Review of Systems Unless otherwise noted, a complete review of systems is negative.  Physical Exam General: NAD Cardiovascular: regular rate and rhythm Pulmonary: clear anterior/posterior fields Abdomen: soft, nontender, + bowel sounds GU: no suprapubic tenderness, no CVA tenderness Extremities: no edema, no joint deformities Skin: no rashes Neurological: Grossly nonfocal  Assessment and Plan- Patient is a 88 y.o. 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  Patient seen in [facility] for recheck of symptoms and reports some urinary pressure, frequency, and urgency.   Dysuria - Overall, patient feels much improved today.  We will recheck labs today to ensure improving AKI.  We will send for urinalysis and culture.  We will proceed with IV fluids 500 mL x 1.  Plan to resume chemo next week on regular schedule.  Patient to follow-up sooner if needed.  Addendum: UA appears consistent with infection.  We will start patient on amoxicillin renally dosed at 500 mg every 12 hours x5 days.  Culture and sensitivities pending.  Patient expressed understanding and was in agreement with this plan. She also understands that She can call clinic at any time with any questions, concerns, or complaints.   Thank you for allowing me to participate in the care of this very pleasant patient.   Time Total: 15 minutes  Visit consisted of counseling and education dealing with the complex and emotionally intense issues of symptom management in the setting of serious illness.Greater than 50%  of this time was spent counseling and coordinating care related to the above assessment and plan.  Signed by: Altha Harm, PhD, NP-C

## 2021-06-17 NOTE — Addendum Note (Signed)
Addended by: Altha Harm R on: 06/17/2021 01:04 PM   Modules accepted: Orders

## 2021-06-18 ENCOUNTER — Ambulatory Visit
Admission: RE | Admit: 2021-06-18 | Discharge: 2021-06-18 | Disposition: A | Discharge status: Home / Self Care | Payer: Medicare PPO | Source: Ambulatory Visit | Attending: Radiation Oncology | Admitting: Radiation Oncology

## 2021-06-18 DIAGNOSIS — Z87891 Personal history of nicotine dependence: Secondary | ICD-10-CM | POA: Diagnosis not present

## 2021-06-18 DIAGNOSIS — C771 Secondary and unspecified malignant neoplasm of intrathoracic lymph nodes: Secondary | ICD-10-CM | POA: Diagnosis not present

## 2021-06-18 DIAGNOSIS — C154 Malignant neoplasm of middle third of esophagus: Secondary | ICD-10-CM | POA: Diagnosis not present

## 2021-06-19 LAB — URINE CULTURE: Culture: 50000 — AB

## 2021-06-21 ENCOUNTER — Telehealth: Payer: Self-pay | Admitting: Hospice and Palliative Medicine

## 2021-06-21 ENCOUNTER — Ambulatory Visit
Admission: RE | Admit: 2021-06-21 | Discharge: 2021-06-21 | Disposition: A | Discharge status: Home / Self Care | Payer: Medicare PPO | Source: Ambulatory Visit | Attending: Radiation Oncology | Admitting: Radiation Oncology

## 2021-06-21 DIAGNOSIS — Z87891 Personal history of nicotine dependence: Secondary | ICD-10-CM | POA: Diagnosis not present

## 2021-06-21 DIAGNOSIS — C154 Malignant neoplasm of middle third of esophagus: Secondary | ICD-10-CM | POA: Diagnosis not present

## 2021-06-21 DIAGNOSIS — C771 Secondary and unspecified malignant neoplasm of intrathoracic lymph nodes: Secondary | ICD-10-CM | POA: Diagnosis not present

## 2021-06-21 MED ORDER — SULFAMETHOXAZOLE-TRIMETHOPRIM 800-160 MG PO TABS: 1.0000 | ORAL_TABLET | Freq: Every day | ORAL | 0 refills | Status: DC

## 2021-06-21 NOTE — Telephone Encounter (Signed)
Urine culture positive for Klebsiella but not sensitive to penicillins.  Spoke with patient by phone and symptoms have improved but are still persistent.  We will rotate to renally dosed Bactrim x3 days.

## 2021-06-22 ENCOUNTER — Inpatient Hospital Stay: Payer: Medicare PPO

## 2021-06-22 ENCOUNTER — Other Ambulatory Visit: Payer: Self-pay

## 2021-06-22 ENCOUNTER — Ambulatory Visit
Admission: RE | Admit: 2021-06-22 | Discharge: 2021-06-22 | Disposition: A | Discharge status: Home / Self Care | Payer: Medicare PPO | Source: Ambulatory Visit | Attending: Radiation Oncology | Admitting: Radiation Oncology

## 2021-06-22 ENCOUNTER — Inpatient Hospital Stay (HOSPITAL_BASED_OUTPATIENT_CLINIC_OR_DEPARTMENT_OTHER): Payer: Medicare PPO | Admitting: Hospice and Palliative Medicine

## 2021-06-22 VITALS — BP 140/65 | HR 81 | Temp 96.5°F | Resp 18

## 2021-06-22 DIAGNOSIS — E860 Dehydration: Secondary | ICD-10-CM

## 2021-06-22 DIAGNOSIS — N179 Acute kidney failure, unspecified: Secondary | ICD-10-CM | POA: Diagnosis not present

## 2021-06-22 DIAGNOSIS — N390 Urinary tract infection, site not specified: Secondary | ICD-10-CM | POA: Diagnosis not present

## 2021-06-22 DIAGNOSIS — C155 Malignant neoplasm of lower third of esophagus: Secondary | ICD-10-CM

## 2021-06-22 DIAGNOSIS — R531 Weakness: Secondary | ICD-10-CM | POA: Diagnosis not present

## 2021-06-22 DIAGNOSIS — Z79899 Other long term (current) drug therapy: Secondary | ICD-10-CM | POA: Diagnosis not present

## 2021-06-22 DIAGNOSIS — Z87891 Personal history of nicotine dependence: Secondary | ICD-10-CM | POA: Diagnosis not present

## 2021-06-22 DIAGNOSIS — C771 Secondary and unspecified malignant neoplasm of intrathoracic lymph nodes: Secondary | ICD-10-CM | POA: Diagnosis not present

## 2021-06-22 DIAGNOSIS — D649 Anemia, unspecified: Secondary | ICD-10-CM | POA: Diagnosis not present

## 2021-06-22 DIAGNOSIS — Z5111 Encounter for antineoplastic chemotherapy: Secondary | ICD-10-CM | POA: Diagnosis not present

## 2021-06-22 DIAGNOSIS — C154 Malignant neoplasm of middle third of esophagus: Secondary | ICD-10-CM | POA: Diagnosis not present

## 2021-06-22 LAB — CBC WITH DIFFERENTIAL/PLATELET
Abs Immature Granulocytes: 0.03 10*3/uL (ref 0.00–0.07)
Basophils Absolute: 0 10*3/uL (ref 0.0–0.1)
Basophils Relative: 1 %
Eosinophils Absolute: 0.1 10*3/uL (ref 0.0–0.5)
Eosinophils Relative: 4 %
HCT: 29.5 % — ABNORMAL LOW (ref 36.0–46.0)
Hemoglobin: 10 g/dL — ABNORMAL LOW (ref 12.0–15.0)
Immature Granulocytes: 1 %
Lymphocytes Relative: 24 %
Lymphs Abs: 0.8 10*3/uL (ref 0.7–4.0)
MCH: 30 pg (ref 26.0–34.0)
MCHC: 33.9 g/dL (ref 30.0–36.0)
MCV: 88.6 fL (ref 80.0–100.0)
Monocytes Absolute: 0.4 10*3/uL (ref 0.1–1.0)
Monocytes Relative: 13 %
Neutro Abs: 1.8 10*3/uL (ref 1.7–7.7)
Neutrophils Relative %: 57 %
Platelets: 193 10*3/uL (ref 150–400)
RBC: 3.33 MIL/uL — ABNORMAL LOW (ref 3.87–5.11)
RDW: 13.4 % (ref 11.5–15.5)
Smear Review: NORMAL
WBC: 3.2 10*3/uL — ABNORMAL LOW (ref 4.0–10.5)
nRBC: 0 % (ref 0.0–0.2)

## 2021-06-22 LAB — COMPREHENSIVE METABOLIC PANEL
ALT: 22 U/L (ref 0–44)
AST: 22 U/L (ref 15–41)
Albumin: 3.7 g/dL (ref 3.5–5.0)
Alkaline Phosphatase: 66 U/L (ref 38–126)
Anion gap: 8 (ref 5–15)
BUN: 12 mg/dL (ref 8–23)
CO2: 24 mmol/L (ref 22–32)
Calcium: 9 mg/dL (ref 8.9–10.3)
Chloride: 99 mmol/L (ref 98–111)
Creatinine, Ser: 0.96 mg/dL (ref 0.44–1.00)
GFR, Estimated: 57 mL/min — ABNORMAL LOW (ref >60)
Glucose, Bld: 262 mg/dL — ABNORMAL HIGH (ref 70–99)
Potassium: 3.9 mmol/L (ref 3.5–5.1)
Sodium: 131 mmol/L — ABNORMAL LOW (ref 135–145)
Total Bilirubin: 0.2 mg/dL — ABNORMAL LOW (ref 0.3–1.2)
Total Protein: 6.7 g/dL (ref 6.5–8.1)

## 2021-06-22 MED ORDER — SODIUM CHLORIDE 0.9 % IV SOLN
INTRAVENOUS | Status: AC
  Filled 2021-06-22: qty 250

## 2021-06-22 NOTE — Progress Notes (Signed)
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  Patient last saw Dr. Rogue Bussing on 06/09/2021 at which time she received cycle 3 carbo Abraxane.  Patient returned on 06/16/2021 for cycle 4 chemotherapy but was found to be somewhat hypotensive and labs consistent with dehydration.  Chemo was held and patient instead received IV fluids.  She was seen in [facility] on 06/17/2021 and found to have UTI.  She is started on amoxicillin per culture ultimately was positive for Klebsiella resistant to penicillins.  She was rotated to Bactrim DS, which patient started yesterday.  Patient presents to [facility] today complaining that she felt weak and lightheaded last night.  Daughter feels like she did not get enough to eat or drink.  Patient reports that she feels some better today after eating breakfast.  Denies any neurologic complaints. Denies recent fevers or chills. Denies any easy bleeding or bruising. Reports fair appetite and denies weight loss. Denies chest pain. Denies any nausea, vomiting, constipation, or diarrhea. Patient offers no further specific complaints today.  PAST MEDICAL HISTORY: Past Medical History:  Diagnosis Date   Chemotherapy induced nausea and vomiting    Chicken pox    Esophageal cancer (HCC)    Hypercalcemia    familial hypocalciuric hypercalcemia   Hypercholesterolemia    Hypertension    Lung cancer ([place])    Osteoporosis    Thyroid disease     PAST SURGICAL HISTORY:  Past Surgical  History:  Procedure Laterality Date   ABDOMINAL HYSTERECTOMY     partial   CHOLECYSTECTOMY     COLONOSCOPY WITH PROPOFOL N/A 04/17/2017   Procedure: COLONOSCOPY WITH PROPOFOL;  Surgeon: Manya Silvas, MD;  Location: [facility] ENDOSCOPY;  Service: Endoscopy;  Laterality: N/A;   ESOPHAGOGASTRODUODENOSCOPY (EGD) WITH PROPOFOL N/A 04/13/2021   Procedure: ESOPHAGOGASTRODUODENOSCOPY (EGD) WITH PROPOFOL;  Surgeon: [name], MD;  Location: [facility] ENDOSCOPY;  Service: Endoscopy;  Laterality: N/A;   EUS N/A 04/22/2021   Procedure: FULL UPPER ENDOSCOPIC ULTRASOUND (EUS) RADIAL;  Surgeon: Jola Schmidt, MD;  Location: [facility] ENDOSCOPY;  Service: Endoscopy;  Laterality: N/A;  [facility] needed   transvaginal hysterectomy  04/18/05   with anterior colporrhaphy    HEMATOLOGY/ONCOLOGY HISTORY:  Oncology History Overview Note  # 2015-patient is an 88 year old female with 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  Starting radiation and chemotherapy from April 14, 2014 Patient was started on carboplatinum andTaxol Herve were developed an allergic reaction to Taxol so would be changed to Abraxane 3.patient has finished 6 cycles of weekly chemotherapy with carboplatinum  and radiation therapy(May 28, 2014) 4.started on  NIVOLULAMAB because of persistent disease July 02, 2014. 5.  NIVOLULAMAB was discontinued because of persistent diarrhea in July of 2016.  August of 2016 CT scan was stable so no further chemotherapy  #  AUG 25th PET- STABLE RUL MASS [radiation fibrosis; <1cm ? Mediastinal recurrence];   # DEC 2022-squamous cell carcinoma of the midesophagus -carbo Abraxane weekly with radiation  # AAA/ 3.3x 3.9  Stable [Dr.Schnier] -----------------------------------------------------       History of lung cancer  Malignant neoplasm of right upper lobe of lung ([place])  08/07/2019 Initial Diagnosis   Malignant neoplasm of right upper lobe of lung (HCC)     ALLERGIES:  is allergic to paclitaxel.  MEDICATIONS:  Current Outpatient Medications  Medication Sig Dispense Refill   amoxicillin (AMOXIL) 500 MG capsule Take 1 capsule (500 mg total) by mouth 2 (two) times daily. (Patient not taking: Reported on 06/22/2021) 10 capsule 0   Ascorbic Acid (VITAMIN C) 1000 MG tablet Take 1,000 mg by mouth daily.     cholecalciferol (VITAMIN D3) 25 MCG (1000 UNIT) tablet Take 1,000 Units by mouth daily.     dexamethasone (DECADRON) 4 MG tablet Take 2 tablets (8 mg total) by mouth daily. Start the day after chemotherapy for 2 days. (Patient not taking: Reported on 06/17/2021) 30 tablet 1   diphenoxylate-atropine (LOMOTIL) 2.5-0.025 MG tablet Take 1 tablet by mouth 4 (four) times daily as needed for diarrhea or loose stools. Take it along with immodium 30 tablet 0   fluticasone (FLONASE) 50 MCG/ACT nasal spray Place 2 sprays into both nostrils daily. (Patient not taking: Reported on 06/17/2021) 16 g 3   levocetirizine (XYZAL) 5 MG tablet Take 1 tablet (5 mg total) by mouth every evening. 90 tablet 2   LORazepam (ATIVAN) 0.5 MG tablet Take 1 tablet (0.5 mg total) by mouth every 6 (six) hours as needed (Nausea or vomiting). 30 tablet 0   olmesartan-hydrochlorothiazide (BENICAR HCT) 20-12.5 MG tablet TAKE 1 TABLET BY MOUTH ONCE DAILY (Patient not taking: Reported on 06/17/2021) 90 tablet 1   ondansetron (ZOFRAN) 8 MG tablet Take 1 tablet (8 mg total) by mouth 2 (two) times daily as needed for refractory nausea / vomiting. Start on day 3 after chemo. (Patient not taking: Reported on 06/17/2021) 30 tablet 1   pantoprazole (PROTONIX) 40 MG tablet TAKE 1 TABLET BY MOUTH ONCE DAILY 90 tablet 3   prochlorperazine (COMPAZINE) 10 MG  tablet Take 1 tablet (10 mg total) by mouth every 6 (six) hours as needed (Nausea or vomiting). (Patient not taking: Reported on 06/17/2021) 30 tablet 1   sulfamethoxazole-trimethoprim (BACTRIM DS) 800-160 MG tablet Take 1 tablet by mouth daily. 3 tablet 0   No current facility-administered medications for this visit.   Facility-Administered Medications Ordered in Other Visits  Medication Dose Route Frequency Provider Last Rate Last Admin   sodium chloride 0.9 % 1,000 mL with potassium chloride 20 mEq, magnesium sulfate 2 g infusion   Intravenous Continuous Choksi, Janak, MD   Stopped at 12/04/14 1300    VITAL SIGNS: BP 140/65    Pulse 81    Temp (!) 96.5 F (35.8 C) (Tympanic)    Resp 18  There were no vitals filed for this visit.  Estimated body mass index is 25.7 kg/m as calculated from the following:   Height as of 06/09/21: 5' 3"  (1.6 m).   Weight as of 06/17/21: 145 lb 1.6 oz (65.8 kg).  LABS: CBC:    Component Value Date/Time   WBC 3.2 (L) 06/22/2021 1012   HGB 10.0 (L) 06/22/2021 1012   HGB 10.5 (L) 10/09/2014 0846   HCT 29.5 (L) 06/22/2021 1012   HCT 30.7 (L) 10/09/2014 0846   PLT 193  06/22/2021 1012   PLT 218 10/09/2014 0846   MCV 88.6 06/22/2021 1012   MCV 85 10/09/2014 0846   NEUTROABS PENDING 06/22/2021 1012   NEUTROABS 9.7 (H) 10/09/2014 0846   LYMPHSABS PENDING 06/22/2021 1012   LYMPHSABS 0.5 (L) 10/09/2014 0846   MONOABS PENDING 06/22/2021 1012   MONOABS 0.8 10/09/2014 0846   EOSABS PENDING 06/22/2021 1012   EOSABS 0.1 10/09/2014 0846   BASOSABS PENDING 06/22/2021 1012   BASOSABS 0.0 10/09/2014 0846   Comprehensive Metabolic Panel:    Component Value Date/Time   NA 133 (L) 06/16/2021 1249   NA 130 (L) 10/09/2014 0846   K 4.3 06/16/2021 1249   K 4.1 10/09/2014 0846   CL 99 06/16/2021 1249   CL 98 (L) 10/09/2014 0846   CO2 25 06/16/2021 1249   CO2 25 10/09/2014 0846   BUN 38 (H) 06/16/2021 1249   BUN 14 10/09/2014 0846   CREATININE 1.40 (H) 06/16/2021  1249   CREATININE 0.70 10/09/2014 0846   GLUCOSE 167 (H) 06/16/2021 1249   GLUCOSE 161 (H) 10/09/2014 0846   CALCIUM 8.8 (L) 06/16/2021 1249   CALCIUM 9.1 10/09/2014 0846   AST 18 06/16/2021 1249   AST 16 10/09/2014 0846   ALT 20 06/16/2021 1249   ALT 13 (L) 10/09/2014 0846   ALKPHOS 62 06/16/2021 1249   ALKPHOS 70 10/09/2014 0846   BILITOT 0.5 06/16/2021 1249   BILITOT 1.0 10/09/2014 0846   PROT 7.0 06/16/2021 1249   PROT 7.3 10/09/2014 0846   ALBUMIN 3.7 06/16/2021 1249   ALBUMIN 3.6 10/09/2014 0846    RADIOGRAPHIC STUDIES: No results found.  PERFORMANCE STATUS (ECOG) : 1 - Symptomatic but completely ambulatory  Review of Systems Unless otherwise noted, a complete review of systems is negative.  Physical Exam General: NAD Cardiovascular: regular rate and rhythm Pulmonary: clear anterior/posterior fields Abdomen: soft, nontender, + bowel sounds GU: no suprapubic tenderness, no CVA tenderness Extremities: no edema, no joint deformities Skin: no rashes Neurological: Grossly nonfocal  Assessment and Plan- Patient is a 88 y.o. 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  Patient seen in [facility] for evaluation of weakness   Weakness-labs reveal mild hyponatremia likely secondary to dehydration.  We will proceed with IV fluids today.  Her urinary symptoms have resolved and patient overall feels better at time of visit.  Recommend continuing course of antibiotics.  We will refer her to [facility] for rehab screening.  Patient RTC tomorrow as previously scheduled.  Recheck labs tomorrow.  Patient expressed understanding and was in agreement with this plan. She also understands that She can call clinic at any time with any questions, concerns, or complaints.   Thank you for allowing me to participate in the care of this very pleasant patient.   Time Total: 15 minutes  Visit  consisted of counseling and education dealing with the complex and emotionally intense issues of symptom management in the setting of serious illness.Greater than 50%  of this time was spent counseling and coordinating care related to the above assessment and plan.  Signed by: Altha Harm, PhD, NP-C

## 2021-06-22 NOTE — Progress Notes (Signed)
Pt reports that she became light headed and weak last night. States that she started a new antibiotic for uti. She stayed in bed this morning until her daughter arrived. Pt ate breakfast and is since feeling better. Denies dizziness at this time, but states that her legs feel "a little weak".

## 2021-06-23 ENCOUNTER — Ambulatory Visit
Admission: RE | Admit: 2021-06-23 | Discharge: 2021-06-23 | Disposition: A | Discharge status: Home / Self Care | Payer: Medicare PPO | Source: Ambulatory Visit | Attending: Radiation Oncology | Admitting: Radiation Oncology

## 2021-06-23 ENCOUNTER — Inpatient Hospital Stay: Payer: Medicare PPO

## 2021-06-23 ENCOUNTER — Encounter: Payer: Self-pay | Admitting: Internal Medicine

## 2021-06-23 ENCOUNTER — Inpatient Hospital Stay (HOSPITAL_BASED_OUTPATIENT_CLINIC_OR_DEPARTMENT_OTHER): Payer: Medicare PPO | Admitting: Internal Medicine

## 2021-06-23 DIAGNOSIS — Z79899 Other long term (current) drug therapy: Secondary | ICD-10-CM | POA: Diagnosis not present

## 2021-06-23 DIAGNOSIS — C154 Malignant neoplasm of middle third of esophagus: Secondary | ICD-10-CM | POA: Diagnosis not present

## 2021-06-23 DIAGNOSIS — D649 Anemia, unspecified: Secondary | ICD-10-CM | POA: Diagnosis not present

## 2021-06-23 DIAGNOSIS — Z5111 Encounter for antineoplastic chemotherapy: Secondary | ICD-10-CM | POA: Diagnosis not present

## 2021-06-23 DIAGNOSIS — D490 Neoplasm of unspecified behavior of digestive system: Secondary | ICD-10-CM

## 2021-06-23 DIAGNOSIS — N390 Urinary tract infection, site not specified: Secondary | ICD-10-CM | POA: Diagnosis not present

## 2021-06-23 DIAGNOSIS — Z87891 Personal history of nicotine dependence: Secondary | ICD-10-CM | POA: Diagnosis not present

## 2021-06-23 DIAGNOSIS — N179 Acute kidney failure, unspecified: Secondary | ICD-10-CM | POA: Diagnosis not present

## 2021-06-23 DIAGNOSIS — C771 Secondary and unspecified malignant neoplasm of intrathoracic lymph nodes: Secondary | ICD-10-CM | POA: Diagnosis not present

## 2021-06-23 LAB — CBC WITH DIFFERENTIAL/PLATELET
Abs Immature Granulocytes: 0.03 10*3/uL (ref 0.00–0.07)
Basophils Absolute: 0 10*3/uL (ref 0.0–0.1)
Basophils Relative: 1 %
Eosinophils Absolute: 0.2 10*3/uL (ref 0.0–0.5)
Eosinophils Relative: 6 %
HCT: 30.3 % — ABNORMAL LOW (ref 36.0–46.0)
Hemoglobin: 10.1 g/dL — ABNORMAL LOW (ref 12.0–15.0)
Immature Granulocytes: 1 %
Lymphocytes Relative: 34 %
Lymphs Abs: 1.2 10*3/uL (ref 0.7–4.0)
MCH: 29.6 pg (ref 26.0–34.0)
MCHC: 33.3 g/dL (ref 30.0–36.0)
MCV: 88.9 fL (ref 80.0–100.0)
Monocytes Absolute: 0.5 10*3/uL (ref 0.1–1.0)
Monocytes Relative: 15 %
Neutro Abs: 1.5 10*3/uL — ABNORMAL LOW (ref 1.7–7.7)
Neutrophils Relative %: 43 %
Platelets: 206 10*3/uL (ref 150–400)
RBC: 3.41 MIL/uL — ABNORMAL LOW (ref 3.87–5.11)
RDW: 13.8 % (ref 11.5–15.5)
Smear Review: NORMAL
WBC Morphology: NORMAL
WBC: 3.6 10*3/uL — ABNORMAL LOW (ref 4.0–10.5)
nRBC: 0 % (ref 0.0–0.2)

## 2021-06-23 LAB — COMPREHENSIVE METABOLIC PANEL
ALT: 21 U/L (ref 0–44)
AST: 22 U/L (ref 15–41)
Albumin: 3.5 g/dL (ref 3.5–5.0)
Alkaline Phosphatase: 69 U/L (ref 38–126)
Anion gap: 5 (ref 5–15)
BUN: 9 mg/dL (ref 8–23)
CO2: 25 mmol/L (ref 22–32)
Calcium: 8.9 mg/dL (ref 8.9–10.3)
Chloride: 105 mmol/L (ref 98–111)
Creatinine, Ser: 1.02 mg/dL — ABNORMAL HIGH (ref 0.44–1.00)
GFR, Estimated: 53 mL/min — ABNORMAL LOW (ref >60)
Glucose, Bld: 172 mg/dL — ABNORMAL HIGH (ref 70–99)
Potassium: 4.3 mmol/L (ref 3.5–5.1)
Sodium: 135 mmol/L (ref 135–145)
Total Bilirubin: 0.4 mg/dL (ref 0.3–1.2)
Total Protein: 6.6 g/dL (ref 6.5–8.1)

## 2021-06-23 MED ORDER — PALONOSETRON HCL INJECTION 0.25 MG/5ML
0.2500 mg | Freq: Once | INTRAVENOUS | Status: AC
  Administered 2021-06-23: 0.25 mg via INTRAVENOUS
  Filled 2021-06-23: qty 5

## 2021-06-23 MED ORDER — PACLITAXEL PROTEIN-BOUND CHEMO INJECTION 100 MG
100.0000 mg/m2 | Freq: Once | INTRAVENOUS | Status: AC
  Administered 2021-06-23: 175 mg via INTRAVENOUS
  Filled 2021-06-23: qty 35

## 2021-06-23 MED ORDER — SODIUM CHLORIDE 0.9% FLUSH
10.0000 mL | INTRAVENOUS | Status: DC | PRN
  Filled 2021-06-23: qty 10

## 2021-06-23 MED ORDER — SODIUM CHLORIDE 0.9 % IV SOLN
131.2000 mg | Freq: Once | INTRAVENOUS | Status: AC
  Administered 2021-06-23: 130 mg via INTRAVENOUS
  Filled 2021-06-23: qty 13

## 2021-06-23 MED ORDER — SODIUM CHLORIDE 0.9 % IV SOLN
Freq: Once | INTRAVENOUS | Status: AC
  Filled 2021-06-23: qty 250

## 2021-06-23 MED ORDER — SODIUM CHLORIDE 0.9 % IV SOLN
10.0000 mg | Freq: Once | INTRAVENOUS | Status: AC
  Administered 2021-06-23: 10 mg via INTRAVENOUS
  Filled 2021-06-23: qty 10

## 2021-06-23 NOTE — Progress Notes (Signed)
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  Starting radiation and chemotherapy from April 14, 2014 Patient was started on carboplatinum andTaxol Herve were developed an allergic reaction to Taxol so would be changed to Abraxane 3.patient has finished 6 cycles of weekly chemotherapy with carboplatinum  and radiation therapy(May 28, 2014) 4.started on  NIVOLULAMAB because of persistent disease July 02, 2014. 5.  NIVOLULAMAB was discontinued because of persistent diarrhea in July of 2016.  August of 2016 CT scan was stable so no further chemotherapy  # AUG 25th PET- STABLE RUL MASS [radiation fibrosis; <1cm ? Mediastinal recurrence];   # DEC 2022-squamous cell carcinoma of the midesophagus -carbo Abraxane weekly with radiation  # AAA/ 3.3x 3.9 Stable [Dr.Schnier] -----------------------------------------------------       History of lung cancer  Malignant neoplasm of right upper lobe of lung ([place])  08/07/2019 Initial Diagnosis   Malignant neoplasm of right upper lobe of lung ([place])    INTERVAL HISTORY: Ambulating with a wheelchair.  Accompanied by her daughter.  Heather Cooper 88 y.o.  female pleasant patient newly  diagnosed squamous cell carcinoma of the midesophagus-locally advanced on definitive [carbo-abraxane]chemoradiation is here for follow-up.  Patient chemotherapy was held last week because of elevated renal function/borderline low blood pressures.  Patient received IV fluids.  S/p evaluation in symptom management clinic-positive for UTI.  S/p Bactrim.  Significant improvement since IV fluids./Antibiotics.  She is back to baseline.  Denies any significant difficulty swallowing or nausea vomiting.  She has been drinking Gatorade.  Review of Systems  Constitutional:  Negative for chills, diaphoresis, fever, malaise/fatigue and weight loss.  HENT:  Negative for nosebleeds and sore throat.   Eyes:  Negative for double vision.  Respiratory:  Positive for cough. Negative for hemoptysis, sputum production, shortness of breath and wheezing.   Cardiovascular:  Negative for chest pain, palpitations, orthopnea and leg swelling.  Gastrointestinal:  Negative for abdominal pain, blood in stool, constipation, diarrhea, heartburn, melena, nausea and vomiting.  Genitourinary:  Negative for dysuria, frequency and urgency.  Musculoskeletal:  Negative for back pain and joint pain.  Skin: Negative.  Negative for itching and rash.  Neurological:  Negative for dizziness, tingling, focal weakness, weakness and headaches.  Endo/Heme/Allergies:  Does not bruise/bleed easily.  Psychiatric/Behavioral:  Negative for depression. The patient is not nervous/anxious and does not have insomnia.     PAST MEDICAL HISTORY :  Past Medical History:  Diagnosis Date   Chemotherapy induced nausea and vomiting    Chicken pox    Esophageal cancer (HCC)    Hypercalcemia    familial hypocalciuric hypercalcemia   Hypercholesterolemia    Hypertension    Lung cancer ([place])    Osteoporosis    Thyroid disease  PAST SURGICAL HISTORY :   Past Surgical History:  Procedure Laterality Date   ABDOMINAL HYSTERECTOMY     partial    CHOLECYSTECTOMY     COLONOSCOPY WITH PROPOFOL N/A 04/17/2017   Procedure: COLONOSCOPY WITH PROPOFOL;  Surgeon: Manya Silvas, MD;  Location: [facility] ENDOSCOPY;  Service: Endoscopy;  Laterality: N/A;   ESOPHAGOGASTRODUODENOSCOPY (EGD) WITH PROPOFOL N/A 04/13/2021   Procedure: ESOPHAGOGASTRODUODENOSCOPY (EGD) WITH PROPOFOL;  Surgeon: [name], MD;  Location: [facility] ENDOSCOPY;  Service: Endoscopy;  Laterality: N/A;   EUS N/A 04/22/2021   Procedure: FULL UPPER ENDOSCOPIC ULTRASOUND (EUS) RADIAL;  Surgeon: Jola Schmidt, MD;  Location: [facility] ENDOSCOPY;  Service: Endoscopy;  Laterality: N/A;  [facility] needed   transvaginal hysterectomy  04/18/05   with anterior colporrhaphy    FAMILY HISTORY :   Family History  Problem Relation Age of Onset   Stroke Mother    Hypertension Mother    Prostate cancer Father    Cancer Father        prostate   Heart disease Brother        s/p CABG   Colon cancer Neg Hx     SOCIAL HISTORY:   Social History   Tobacco Use   Smoking status: Former    Types: Cigarettes    Quit date: 06/13/1997    Years since quitting: 24.0   Smokeless tobacco: Never  Vaping Use   Vaping Use: Never used  Substance Use Topics   Alcohol use: No    Alcohol/week: 0.0 standard drinks   Drug use: No    ALLERGIES:  is allergic to paclitaxel.  MEDICATIONS:  Current Outpatient Medications  Medication Sig Dispense Refill   cholecalciferol (VITAMIN D3) 25 MCG (1000 UNIT) tablet Take 1,000 Units by mouth daily.     dexamethasone (DECADRON) 4 MG tablet Take 2 tablets (8 mg total) by mouth daily. Start the day after chemotherapy for 2 days. 30 tablet 1   diphenoxylate-atropine (LOMOTIL) 2.5-0.025 MG tablet Take 1 tablet by mouth 4 (four) times daily as needed for diarrhea or loose stools. Take it along with immodium 30 tablet 0   fluticasone (FLONASE) 50 MCG/ACT nasal spray Place 2 sprays into both nostrils daily. 16 g 3   levocetirizine (XYZAL) 5 MG tablet Take 1 tablet (5 mg total)  by mouth every evening. 90 tablet 2   LORazepam (ATIVAN) 0.5 MG tablet Take 1 tablet (0.5 mg total) by mouth every 6 (six) hours as needed (Nausea or vomiting). 30 tablet 0   ondansetron (ZOFRAN) 8 MG tablet Take 1 tablet (8 mg total) by mouth 2 (two) times daily as needed for refractory nausea / vomiting. Start on day 3 after chemo. 30 tablet 1   pantoprazole (PROTONIX) 40 MG tablet TAKE 1 TABLET BY MOUTH ONCE DAILY 90 tablet 3   prochlorperazine (COMPAZINE) 10 MG tablet Take 1 tablet (10 mg total) by mouth every 6 (six) hours as needed (Nausea or vomiting). 30 tablet 1   sulfamethoxazole-trimethoprim (BACTRIM DS) 800-160 MG tablet Take 1 tablet by mouth daily. 3 tablet 0   Ascorbic Acid (VITAMIN C) 1000 MG tablet Take 1,000 mg by mouth daily. (Patient not taking: Reported on 06/23/2021)     olmesartan-hydrochlorothiazide (BENICAR HCT) 20-12.5 MG tablet TAKE 1 TABLET BY MOUTH ONCE DAILY (Patient not taking: Reported on 06/17/2021) 90 tablet 1   No current facility-administered medications for this visit.   Facility-Administered Medications Ordered in Other Visits  Medication Dose Route Frequency Provider Last Rate Last Admin  CARBOplatin (PARAPLATIN) 130 mg in sodium chloride 0.9 % 100 mL chemo infusion  130 mg Intravenous Once Cammie Sickle, MD 226 mL/hr at 06/23/21 1256 130 mg at 06/23/21 1256   sodium chloride 0.9 % 1,000 mL with potassium chloride 20 mEq, magnesium sulfate 2 g infusion   Intravenous Continuous Forest Gleason, MD   Stopped at 12/04/14 1300   sodium chloride flush (NS) 0.9 % injection 10 mL  10 mL Intracatheter PRN Cammie Sickle, MD        PHYSICAL EXAMINATION: ECOG PERFORMANCE STATUS: 0 - Asymptomatic  BP 131/65 (BP Location: Left Arm, Patient Position: Sitting, Cuff Size: Normal)    Pulse 87    Temp 97.8 F (36.6 C) (Tympanic)    Ht _0  (1.6 m)    Wt 146 lb (66.2 kg)    SpO2 100%    BMI 25.86 kg/m   Filed Weights   06/23/21 1014  Weight: 146 lb (66.2  kg)    Physical Exam HENT:     Head: Normocephalic and atraumatic.     Mouth/Throat:     Pharynx: No oropharyngeal exudate.  Eyes:     Pupils: Pupils are equal, round, and reactive to light.  Cardiovascular:     Rate and Rhythm: Normal rate and regular rhythm.  Pulmonary:     Effort: Pulmonary effort is normal. No respiratory distress.     Breath sounds: Normal breath sounds. No wheezing.  Abdominal:     General: Bowel sounds are normal. There is no distension.     Palpations: Abdomen is soft. There is no mass.     Tenderness: There is no abdominal tenderness. There is no guarding or rebound.  Musculoskeletal:        General: No tenderness. Normal range of motion.     Cervical back: Normal range of motion and neck supple.  Skin:    General: Skin is warm.  Neurological:     Mental Status: She is alert and oriented to person, place, and time.  Psychiatric:        Mood and Affect: Affect normal.     LABORATORY DATA:  I have reviewed the data as listed    Component Value Date/Time   NA 135 06/23/2021 0926   NA 130 (L) 10/09/2014 0846   K 4.3 06/23/2021 0926   K 4.1 10/09/2014 0846   CL 105 06/23/2021 0926   CL 98 (L) 10/09/2014 0846   CO2 25 06/23/2021 0926   CO2 25 10/09/2014 0846   GLUCOSE 172 (H) 06/23/2021 0926   GLUCOSE 161 (H) 10/09/2014 0846   BUN 9 06/23/2021 0926   BUN 14 10/09/2014 0846   CREATININE 1.02 (H) 06/23/2021 0926   CREATININE 0.70 10/09/2014 0846   CALCIUM 8.9 06/23/2021 0926   CALCIUM 9.1 10/09/2014 0846   PROT 6.6 06/23/2021 0926   PROT 7.3 10/09/2014 0846   ALBUMIN 3.5 06/23/2021 0926   ALBUMIN 3.6 10/09/2014 0846   AST 22 06/23/2021 0926   AST 16 10/09/2014 0846   ALT 21 06/23/2021 0926   ALT 13 (L) 10/09/2014 0846   ALKPHOS 69 06/23/2021 0926   ALKPHOS 70 10/09/2014 0846   BILITOT 0.4 06/23/2021 0926   BILITOT 1.0 10/09/2014 0846   GFRNONAA 53 (L) 06/23/2021 0926   GFRNONAA >60 10/09/2014 0846   GFRAA >60 02/05/2020 1004   GFRAA  >60 10/09/2014 0846    No results found for: SPEP, UPEP  Lab Results  Component Value Date   WBC  3.6 (L) 06/23/2021   NEUTROABS 1.5 (L) 06/23/2021   HGB 10.1 (L) 06/23/2021   HCT 30.3 (L) 06/23/2021   MCV 88.9 06/23/2021   PLT 206 06/23/2021      Chemistry      Component Value Date/Time   NA 135 06/23/2021 0926   NA 130 (L) 10/09/2014 0846   K 4.3 06/23/2021 0926   K 4.1 10/09/2014 0846   CL 105 06/23/2021 0926   CL 98 (L) 10/09/2014 0846   CO2 25 06/23/2021 0926   CO2 25 10/09/2014 0846   BUN 9 06/23/2021 0926   BUN 14 10/09/2014 0846   CREATININE 1.02 (H) 06/23/2021 0926   CREATININE 0.70 10/09/2014 0846   GLU 111 03/18/2014 1048      Component Value Date/Time   CALCIUM 8.9 06/23/2021 0926   CALCIUM 9.1 10/09/2014 0846   ALKPHOS 69 06/23/2021 0926   ALKPHOS 70 10/09/2014 0846   AST 22 06/23/2021 0926   AST 16 10/09/2014 0846   ALT 21 06/23/2021 0926   ALT 13 (L) 10/09/2014 0846   BILITOT 0.4 06/23/2021 0926   BILITOT 1.0 10/09/2014 0846       RADIOGRAPHIC STUDIES: I have personally reviewed the radiological images as listed and agreed with the findings in the report. No results found.    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  Currently on concurrent chemoradiation-for locally advanced squamous cell cancer of the esophagus.  # Proceed with #4 weekly treatments of Carbo-Abraxane with radiation.  [plan a total of 6-  RT until Jan 25 th] today labs today reviewed;  acceptable for treatment today.   # Mild  Anemia- improved- Hb 10.2- sec to chemo-STABLE.  # Acute renal failure [01/07]- GR- 30s; improved- GFR 56 today; s/p Bactrim for UTI- continue increased hydration with Gatorade.  # FBG- 172-STABLE-  Monitor closely on chemotherapy.  # DISPOSITION:  # chemo today # in 1 week- MD; labs-  cbc/cmp;carbo-Abraxane # Follow up in 2 weeks- MD; labs- cbc/cmp; carbo-abraxane-Dr.B  Cc; Dr.Scott    No orders of the defined types were placed in this encounter.   All questions were answered. The patient knows to call the clinic with any problems, questions or concerns.      Cammie Sickle, MD 06/23/2021 12:59 PM

## 2021-06-23 NOTE — Patient Instructions (Signed)
[facility] CANCER CTR AT [place]  Discharge Instructions: Thank you for choosing [facility] to provide your oncology and hematology care.  If you have a lab appointment with the [facility], please go directly to the [facility] and check in at the registration area.  Wear comfortable clothing and clothing appropriate for easy access to any Portacath or PICC line.   We strive to give you quality time with your provider. You may need to reschedule your appointment if you arrive late (15 or more minutes).  Arriving late affects you and other patients whose appointments are after yours.  Also, if you miss three or more appointments without notifying the office, you may be dismissed from the clinic at the providers discretion.      For prescription refill requests, have your pharmacy contact our office and allow 72 hours for refills to be completed.    Today you received the following chemotherapy and/or immunotherapy agents - Abraxane, carboplatin      To help prevent nausea and vomiting after your treatment, we encourage you to take your nausea medication as directed.  BELOW ARE SYMPTOMS THAT SHOULD BE REPORTED IMMEDIATELY: *FEVER GREATER THAN 100.4 F (38 C) OR HIGHER *CHILLS OR SWEATING *NAUSEA AND VOMITING THAT IS NOT CONTROLLED WITH YOUR NAUSEA MEDICATION *UNUSUAL SHORTNESS OF BREATH *UNUSUAL BRUISING OR BLEEDING *URINARY PROBLEMS (pain or burning when urinating, or frequent urination) *BOWEL PROBLEMS (unusual diarrhea, constipation, pain near the anus) TENDERNESS IN MOUTH AND THROAT WITH OR WITHOUT PRESENCE OF ULCERS (sore throat, sores in mouth, or a toothache) UNUSUAL RASH, SWELLING OR PAIN  UNUSUAL VAGINAL DISCHARGE OR ITCHING   Items with * indicate a potential emergency and should be followed up as soon as possible or go to the Emergency Department if any problems should occur.  Please show the CHEMOTHERAPY ALERT CARD or IMMUNOTHERAPY ALERT CARD at  check-in to the Emergency Department and triage nurse.  Should you have questions after your visit or need to cancel or reschedule your appointment, please contact [facility]CER [place] [facility] [place]  [phone] and follow the prompts.  Office hours are 8:00 a.m. to 4:30 p.m. Monday - Friday. Please note that voicemails left after 4:00 p.m. may not be returned until the following business day.  We are closed weekends and major holidays. You have access to a nurse at all times for urgent questions. Please call the main number to the clinic [phone] and follow the prompts.  For any non-urgent questions, you may also contact your provider using MyChart. We now offer e-Visits for anyone 63 and older to request care online for non-urgent symptoms. For details visit [email or web address].   Also download the MyChart app! Go to the app store, search "MyChart", open the app, select [facility], and log in with your MyChart username and password.  Due to Covid, a mask is required upon entering the hospital/clinic. If you do not have a mask, one will be given to you upon arrival. For doctor visits, patients may have 1 support person aged 3 or older with them. For treatment visits, patients cannot have anyone with them due to current Covid guidelines and our immunocompromised population.   Nanoparticle Albumin-Bound Paclitaxel injection What is this medication? NANOPARTICLE ALBUMIN-BOUND PACLITAXEL (Na no PAHR ti kuhl  al BYOO muhn-bound  PAK li TAX el) is a chemotherapy drug. It targets fast dividing cells, like cancer cells, and causes these cells to die. This medicine is used to treat advanced breast cancer, lung  cancer, and pancreatic cancer. This medicine may be used for other purposes; ask your health care provider or pharmacist if you have questions. COMMON BRAND NAME(S): Abraxane What should I tell my care team before I take this medication? They need to know if you have any of these  conditions: kidney disease liver disease low blood counts, like low white cell, platelet, or red cell counts lung or breathing disease, like asthma tingling of the fingers or toes, or other nerve disorder an unusual or allergic reaction to paclitaxel, albumin, other chemotherapy, other medicines, foods, dyes, or preservatives pregnant or trying to get pregnant breast-feeding How should I use this medication? This drug is given as an infusion into a vein. It is administered in a hospital or clinic by a specially trained health care professional. Talk to your pediatrician regarding the use of this medicine in children. Special care may be needed. Overdosage: If you think you have taken too much of this medicine contact a poison control center or emergency room at once. NOTE: This medicine is only for you. Do not share this medicine with others. What if I miss a dose? It is important not to miss your dose. Call your doctor or health care professional if you are unable to keep an appointment. What may interact with this medication? This medicine may interact with the following medications: antiviral medicines for hepatitis, HIV or AIDS certain antibiotics like erythromycin and clarithromycin certain medicines for fungal infections like ketoconazole and itraconazole certain medicines for seizures like carbamazepine, phenobarbital, phenytoin gemfibrozil nefazodone rifampin St. John's wort This list may not describe all possible interactions. Give your health care provider a list of all the medicines, herbs, non-prescription drugs, or dietary supplements you use. Also tell them if you smoke, drink alcohol, or use illegal drugs. Some items may interact with your medicine. What should I watch for while using this medication? Your condition will be monitored carefully while you are receiving this medicine. You will need important blood work done while you are taking this medicine. This medicine  can cause serious allergic reactions. If you experience allergic reactions like skin rash, itching or hives, swelling of the face, lips, or tongue, tell your doctor or health care professional right away. In some cases, you may be given additional medicines to help with side effects. Follow all directions for their use. This drug may make you feel generally unwell. This is not uncommon, as chemotherapy can affect healthy cells as well as cancer cells. Report any side effects. Continue your course of treatment even though you feel ill unless your doctor tells you to stop. Call your doctor or health care professional for advice if you get a fever, chills or sore throat, or other symptoms of a cold or flu. Do not treat yourself. This drug decreases your body's ability to fight infections. Try to avoid being around people who are sick. This medicine may increase your risk to bruise or bleed. Call your doctor or health care professional if you notice any unusual bleeding. Be careful brushing and flossing your teeth or using a toothpick because you may get an infection or bleed more easily. If you have any dental work done, tell your dentist you are receiving this medicine. Avoid taking products that contain aspirin, acetaminophen, ibuprofen, naproxen, or ketoprofen unless instructed by your doctor. These medicines may hide a fever. Do not become pregnant while taking this medicine or for 6 months after stopping it. Women should inform their doctor if they wish  to become pregnant or think they might be pregnant. Men should not father a child while taking this medicine or for 3 months after stopping it. There is a potential for serious side effects to an unborn child. Talk to your health care professional or pharmacist for more information. Do not breast-feed an infant while taking this medicine or for 2 weeks after stopping it. This medicine may interfere with the ability to get pregnant or to father a child. You  should talk to your doctor or health care professional if you are concerned about your fertility. What side effects may I notice from receiving this medication? Side effects that you should report to your doctor or health care professional as soon as possible: allergic reactions like skin rash, itching or hives, swelling of the face, lips, or tongue breathing problems changes in vision fast, irregular heartbeat low blood pressure mouth sores pain, tingling, numbness in the hands or feet signs of decreased platelets or bleeding - bruising, pinpoint red spots on the skin, black, tarry stools, blood in the urine signs of decreased red blood cells - unusually weak or tired, feeling faint or lightheaded, falls signs of infection - fever or chills, cough, sore throat, pain or difficulty passing urine signs and symptoms of liver injury like dark yellow or brown urine; general ill feeling or flu-like symptoms; light-colored stools; loss of appetite; nausea; right upper belly pain; unusually weak or tired; yellowing of the eyes or skin swelling of the ankles, feet, hands unusually slow heartbeat Side effects that usually do not require medical attention (report to your doctor or health care professional if they continue or are bothersome): diarrhea hair loss loss of appetite nausea, vomiting tiredness This list may not describe all possible side effects. Call your doctor for medical advice about side effects. You may report side effects to FDA at 1-800-FDA-1088. Where should I keep my medication? This drug is given in a hospital or clinic and will not be stored at home. NOTE: This sheet is a summary. It may not cover all possible information. If you have questions about this medicine, talk to your doctor, pharmacist, or health care provider.  2022 Elsevier/Gold Standard (2017-01-31 00:00:00)  Carboplatin injection What is this medication? CARBOPLATIN (KAR boe pla tin) is a chemotherapy drug. It  targets fast dividing cells, like cancer cells, and causes these cells to die. This medicine is used to treat ovarian cancer and many other cancers. This medicine may be used for other purposes; ask your health care provider or pharmacist if you have questions. COMMON BRAND NAME(S): Paraplatin What should I tell my care team before I take this medication? They need to know if you have any of these conditions: blood disorders hearing problems kidney disease recent or ongoing radiation therapy an unusual or allergic reaction to carboplatin, cisplatin, other chemotherapy, other medicines, foods, dyes, or preservatives pregnant or trying to get pregnant breast-feeding How should I use this medication? This drug is usually given as an infusion into a vein. It is administered in a hospital or clinic by a specially trained health care professional. Talk to your pediatrician regarding the use of this medicine in children. Special care may be needed. Overdosage: If you think you have taken too much of this medicine contact a poison control center or emergency room at once. NOTE: This medicine is only for you. Do not share this medicine with others. What if I miss a dose? It is important not to miss a dose. Call your  doctor or health care professional if you are unable to keep an appointment. What may interact with this medication? medicines for seizures medicines to increase blood counts like filgrastim, pegfilgrastim, sargramostim some antibiotics like amikacin, gentamicin, neomycin, streptomycin, tobramycin vaccines Talk to your doctor or health care professional before taking any of these medicines: acetaminophen aspirin ibuprofen ketoprofen naproxen This list may not describe all possible interactions. Give your health care provider a list of all the medicines, herbs, non-prescription drugs, or dietary supplements you use. Also tell them if you smoke, drink alcohol, or use illegal drugs.  Some items may interact with your medicine. What should I watch for while using this medication? Your condition will be monitored carefully while you are receiving this medicine. You will need important blood work done while you are taking this medicine. This drug may make you feel generally unwell. This is not uncommon, as chemotherapy can affect healthy cells as well as cancer cells. Report any side effects. Continue your course of treatment even though you feel ill unless your doctor tells you to stop. In some cases, you may be given additional medicines to help with side effects. Follow all directions for their use. Call your doctor or health care professional for advice if you get a fever, chills or sore throat, or other symptoms of a cold or flu. Do not treat yourself. This drug decreases your body's ability to fight infections. Try to avoid being around people who are sick. This medicine may increase your risk to bruise or bleed. Call your doctor or health care professional if you notice any unusual bleeding. Be careful brushing and flossing your teeth or using a toothpick because you may get an infection or bleed more easily. If you have any dental work done, tell your dentist you are receiving this medicine. Avoid taking products that contain aspirin, acetaminophen, ibuprofen, naproxen, or ketoprofen unless instructed by your doctor. These medicines may hide a fever. Do not become pregnant while taking this medicine. Women should inform their doctor if they wish to become pregnant or think they might be pregnant. There is a potential for serious side effects to an unborn child. Talk to your health care professional or pharmacist for more information. Do not breast-feed an infant while taking this medicine. What side effects may I notice from receiving this medication? Side effects that you should report to your doctor or health care professional as soon as possible: allergic reactions like skin  rash, itching or hives, swelling of the face, lips, or tongue signs of infection - fever or chills, cough, sore throat, pain or difficulty passing urine signs of decreased platelets or bleeding - bruising, pinpoint red spots on the skin, black, tarry stools, nosebleeds signs of decreased red blood cells - unusually weak or tired, fainting spells, lightheadedness breathing problems changes in hearing changes in vision chest pain high blood pressure low blood counts - This drug may decrease the number of white blood cells, red blood cells and platelets. You may be at increased risk for infections and bleeding. nausea and vomiting pain, swelling, redness or irritation at the injection site pain, tingling, numbness in the hands or feet problems with balance, talking, walking trouble passing urine or change in the amount of urine Side effects that usually do not require medical attention (report to your doctor or health care professional if they continue or are bothersome): hair loss loss of appetite metallic taste in the mouth or changes in taste This list may not describe all  possible side effects. Call your doctor for medical advice about side effects. You may report side effects to FDA at 1-800-FDA-1088. Where should I keep my medication? This drug is given in a hospital or clinic and will not be stored at home. NOTE: This sheet is a summary. It may not cover all possible information. If you have questions about this medicine, talk to your doctor, pharmacist, or health care provider.  2022 Elsevier/Gold Standard (2007-11-07 00:00:00)

## 2021-06-23 NOTE — Assessment & Plan Note (Addendum)
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  Currently on concurrent chemoradiation-for locally advanced squamous cell cancer of the esophagus.  # Proceed with #4 weekly treatments of Carbo-Abraxane with radiation.  [plan a total of 6-  RT until Jan 25 th] today labs today reviewed;  acceptable for treatment today.   # Mild  Anemia- improved- Hb 10.2- sec to chemo-STABLE.  # Acute renal failure [01/07]- GR- 30s; improved- GFR 56 today; s/p Bactrim for UTI- continue increased hydration with Gatorade.  # FBG- 172-STABLE-  Monitor closely on chemotherapy.  # DISPOSITION:  # chemo today # in 1 week- MD; labs- cbc/cmp;carbo-Abraxane # Follow up in 2 weeks- MD; labs- cbc/cmp; carbo-abraxane-Dr.B  Cc; Dr.Scott

## 2021-06-23 NOTE — Progress Notes (Signed)
Nutrition Follow-up:  Patient with esophageal cancer.  Patient receiving palliative chemotherapy and radiation.    Met with patient during infusion.  Patient reports that she is feeling better today than last week.  Says that she is not as dizzy.  Reports that her appetite is good.  Ate eggs, sausage and 1 strip bacon this am with cinnamon toast with water and juice.  Supper last night was chicken with mashed potatoes and gravy.  Lunch yesterday was spaghetti.  Also drinking gatorade and ensure (1-2/day).  Says that she is not having any diarrhea.  Says that she has 1 pill let to take for UTI.      Medications: reviewed  Labs: glucose 172, creatinine 1.02  Anthropometrics:   Weight 146 lb  143 lb 6 oz on 1/4 146 lb 12/20 149 lb on 12/12 148 lb on 12/1 152 lb on 8/26   NUTRITION DIAGNOSIS: Inadequate oral intake stable   INTERVENTION:  Continue ensure 1-2 per day Continue high calorie, high protein foods Encouraged hydration     MONITORING, EVALUATION, GOAL: weight trends, intake   NEXT VISIT: Wednesday, Jan 18 during infusion  Nabria Nevin B. Zenia Resides, [place], [place] Registered Dietitian [phone] (mobile)

## 2021-06-24 ENCOUNTER — Ambulatory Visit
Admission: RE | Admit: 2021-06-24 | Discharge: 2021-06-24 | Disposition: A | Discharge status: Home / Self Care | Payer: Medicare PPO | Source: Ambulatory Visit | Attending: Radiation Oncology | Admitting: Radiation Oncology

## 2021-06-24 DIAGNOSIS — C154 Malignant neoplasm of middle third of esophagus: Secondary | ICD-10-CM | POA: Diagnosis not present

## 2021-06-24 DIAGNOSIS — C771 Secondary and unspecified malignant neoplasm of intrathoracic lymph nodes: Secondary | ICD-10-CM | POA: Diagnosis not present

## 2021-06-24 DIAGNOSIS — Z87891 Personal history of nicotine dependence: Secondary | ICD-10-CM | POA: Diagnosis not present

## 2021-06-25 ENCOUNTER — Telehealth: Payer: Self-pay | Admitting: Internal Medicine

## 2021-06-25 ENCOUNTER — Ambulatory Visit
Admission: RE | Admit: 2021-06-25 | Discharge: 2021-06-25 | Disposition: A | Discharge status: Home / Self Care | Payer: Medicare PPO | Source: Ambulatory Visit | Attending: Radiation Oncology | Admitting: Radiation Oncology

## 2021-06-25 DIAGNOSIS — Z87891 Personal history of nicotine dependence: Secondary | ICD-10-CM | POA: Diagnosis not present

## 2021-06-25 DIAGNOSIS — C154 Malignant neoplasm of middle third of esophagus: Secondary | ICD-10-CM | POA: Diagnosis not present

## 2021-06-25 DIAGNOSIS — C771 Secondary and unspecified malignant neoplasm of intrathoracic lymph nodes: Secondary | ICD-10-CM | POA: Diagnosis not present

## 2021-06-25 NOTE — Telephone Encounter (Addendum)
Pt called in wanting a referral for her long term care. Pt request to be called

## 2021-06-28 ENCOUNTER — Ambulatory Visit
Admission: RE | Admit: 2021-06-28 | Discharge: 2021-06-28 | Disposition: A | Discharge status: Home / Self Care | Payer: Medicare PPO | Source: Ambulatory Visit | Attending: Radiation Oncology | Admitting: Radiation Oncology

## 2021-06-28 DIAGNOSIS — C771 Secondary and unspecified malignant neoplasm of intrathoracic lymph nodes: Secondary | ICD-10-CM | POA: Diagnosis not present

## 2021-06-28 DIAGNOSIS — Z87891 Personal history of nicotine dependence: Secondary | ICD-10-CM | POA: Diagnosis not present

## 2021-06-28 DIAGNOSIS — C154 Malignant neoplasm of middle third of esophagus: Secondary | ICD-10-CM | POA: Diagnosis not present

## 2021-06-28 NOTE — Telephone Encounter (Signed)
Heather Cooper, can you help with this.  I need to know specifically what is needed.  Do I need to see the patient?

## 2021-06-28 NOTE — Telephone Encounter (Signed)
Patient is having trouble with her legs and generalized weakness and she has long term care insurance and she  wants to get help in the home.  She feels she needs someone to come in to help out with in the home with ADLs because her legs can weak standing. The long term insurance company will send out a nurse also to see how much help patient is needing , but patient feels if PCP writes a letter stating patient due to chemo treatments has weakness standing for prolonged periods of time and would benefit for help with preparing meals and light house keeping that she cannot do because the Chemo makes her legs weak.

## 2021-06-28 NOTE — Telephone Encounter (Signed)
Placed call to pt. Pt states she spoke to long term care to request a aide to help her out around the house since she is so weak from the chemo she is getting and that they are sending a packet over for her to fill out. Pt requesting that Dr.Scott provide something saying patient would benefit getting the aide to help out with her around the house. Please advise.

## 2021-06-28 NOTE — Telephone Encounter (Signed)
I see where you called her.  It appears she has called back.  I can do letter, but did not know if there was anything more she needed to speak with you about.  See note.

## 2021-06-28 NOTE — Telephone Encounter (Signed)
Pt called stating she spoke with cathy earlier and she has a few more questions. The questionnaire states does she need help with personal care. Pt is weak taking a shower and need help with personal care

## 2021-06-29 ENCOUNTER — Ambulatory Visit
Admission: RE | Admit: 2021-06-29 | Discharge: 2021-06-29 | Disposition: A | Discharge status: Home / Self Care | Payer: Medicare PPO | Source: Ambulatory Visit | Attending: Radiation Oncology | Admitting: Radiation Oncology

## 2021-06-29 DIAGNOSIS — C154 Malignant neoplasm of middle third of esophagus: Secondary | ICD-10-CM | POA: Diagnosis not present

## 2021-06-29 DIAGNOSIS — C771 Secondary and unspecified malignant neoplasm of intrathoracic lymph nodes: Secondary | ICD-10-CM | POA: Diagnosis not present

## 2021-06-29 DIAGNOSIS — Z87891 Personal history of nicotine dependence: Secondary | ICD-10-CM | POA: Diagnosis not present

## 2021-06-30 ENCOUNTER — Inpatient Hospital Stay: Payer: Medicare PPO

## 2021-06-30 ENCOUNTER — Inpatient Hospital Stay: Payer: Medicare PPO | Admitting: Occupational Therapy

## 2021-06-30 ENCOUNTER — Ambulatory Visit
Admission: RE | Admit: 2021-06-30 | Discharge: 2021-06-30 | Disposition: A | Discharge status: Home / Self Care | Payer: Medicare PPO | Source: Ambulatory Visit | Attending: Radiation Oncology | Admitting: Radiation Oncology

## 2021-06-30 ENCOUNTER — Other Ambulatory Visit: Payer: Self-pay

## 2021-06-30 VITALS — BP 153/73 | HR 96 | Temp 97.9°F | Resp 18 | Ht 63.0 in | Wt 143.5 lb

## 2021-06-30 DIAGNOSIS — Z5111 Encounter for antineoplastic chemotherapy: Secondary | ICD-10-CM | POA: Diagnosis not present

## 2021-06-30 DIAGNOSIS — Z87891 Personal history of nicotine dependence: Secondary | ICD-10-CM | POA: Diagnosis not present

## 2021-06-30 DIAGNOSIS — C154 Malignant neoplasm of middle third of esophagus: Secondary | ICD-10-CM | POA: Diagnosis not present

## 2021-06-30 DIAGNOSIS — D649 Anemia, unspecified: Secondary | ICD-10-CM | POA: Diagnosis not present

## 2021-06-30 DIAGNOSIS — N390 Urinary tract infection, site not specified: Secondary | ICD-10-CM | POA: Diagnosis not present

## 2021-06-30 DIAGNOSIS — C771 Secondary and unspecified malignant neoplasm of intrathoracic lymph nodes: Secondary | ICD-10-CM | POA: Diagnosis not present

## 2021-06-30 DIAGNOSIS — Z79899 Other long term (current) drug therapy: Secondary | ICD-10-CM | POA: Diagnosis not present

## 2021-06-30 DIAGNOSIS — N179 Acute kidney failure, unspecified: Secondary | ICD-10-CM | POA: Diagnosis not present

## 2021-06-30 DIAGNOSIS — D490 Neoplasm of unspecified behavior of digestive system: Secondary | ICD-10-CM

## 2021-06-30 DIAGNOSIS — R531 Weakness: Secondary | ICD-10-CM

## 2021-06-30 LAB — CBC WITH DIFFERENTIAL/PLATELET
Abs Immature Granulocytes: 0.06 10*3/uL (ref 0.00–0.07)
Basophils Absolute: 0 10*3/uL (ref 0.0–0.1)
Basophils Relative: 1 %
Eosinophils Absolute: 0.2 10*3/uL (ref 0.0–0.5)
Eosinophils Relative: 4 %
HCT: 28.3 % — ABNORMAL LOW (ref 36.0–46.0)
Hemoglobin: 9.8 g/dL — ABNORMAL LOW (ref 12.0–15.0)
Immature Granulocytes: 1 %
Lymphocytes Relative: 22 %
Lymphs Abs: 1.1 10*3/uL (ref 0.7–4.0)
MCH: 30.3 pg (ref 26.0–34.0)
MCHC: 34.6 g/dL (ref 30.0–36.0)
MCV: 87.6 fL (ref 80.0–100.0)
Monocytes Absolute: 0.2 10*3/uL (ref 0.1–1.0)
Monocytes Relative: 4 %
Neutro Abs: 3.2 10*3/uL (ref 1.7–7.7)
Neutrophils Relative %: 68 %
Platelets: 235 10*3/uL (ref 150–400)
RBC: 3.23 MIL/uL — ABNORMAL LOW (ref 3.87–5.11)
RDW: 13.7 % (ref 11.5–15.5)
WBC: 4.7 10*3/uL (ref 4.0–10.5)
nRBC: 0 % (ref 0.0–0.2)

## 2021-06-30 LAB — COMPREHENSIVE METABOLIC PANEL
ALT: 20 U/L (ref 0–44)
AST: 22 U/L (ref 15–41)
Albumin: 3.9 g/dL (ref 3.5–5.0)
Alkaline Phosphatase: 69 U/L (ref 38–126)
Anion gap: 9 (ref 5–15)
BUN: 18 mg/dL (ref 8–23)
CO2: 24 mmol/L (ref 22–32)
Calcium: 9.3 mg/dL (ref 8.9–10.3)
Chloride: 102 mmol/L (ref 98–111)
Creatinine, Ser: 0.86 mg/dL (ref 0.44–1.00)
GFR, Estimated: >60 mL/min (ref >60)
Glucose, Bld: 157 mg/dL — ABNORMAL HIGH (ref 70–99)
Potassium: 3.7 mmol/L (ref 3.5–5.1)
Sodium: 135 mmol/L (ref 135–145)
Total Bilirubin: 0.4 mg/dL (ref 0.3–1.2)
Total Protein: 6.7 g/dL (ref 6.5–8.1)

## 2021-06-30 MED ORDER — PACLITAXEL PROTEIN-BOUND CHEMO INJECTION 100 MG
100.0000 mg/m2 | Freq: Once | INTRAVENOUS | Status: AC
  Administered 2021-06-30: 175 mg via INTRAVENOUS
  Filled 2021-06-30: qty 35

## 2021-06-30 MED ORDER — HEPARIN SOD (PORK) LOCK FLUSH 100 UNIT/ML IV SOLN
INTRAVENOUS | Status: AC
  Filled 2021-06-30: qty 5

## 2021-06-30 MED ORDER — PALONOSETRON HCL INJECTION 0.25 MG/5ML
0.2500 mg | Freq: Once | INTRAVENOUS | Status: AC
  Administered 2021-06-30: 0.25 mg via INTRAVENOUS
  Filled 2021-06-30: qty 5

## 2021-06-30 MED ORDER — SODIUM CHLORIDE 0.9 % IV SOLN
10.0000 mg | Freq: Once | INTRAVENOUS | Status: AC
  Administered 2021-06-30: 10 mg via INTRAVENOUS
  Filled 2021-06-30: qty 10

## 2021-06-30 MED ORDER — SODIUM CHLORIDE 0.9 % IV SOLN
132.8000 mg | Freq: Once | INTRAVENOUS | Status: AC
  Administered 2021-06-30: 130 mg via INTRAVENOUS
  Filled 2021-06-30: qty 13

## 2021-06-30 MED ORDER — SODIUM CHLORIDE 0.9 % IV SOLN
Freq: Once | INTRAVENOUS | Status: AC
  Filled 2021-06-30: qty 250

## 2021-06-30 NOTE — Therapy (Signed)
[facility] at [facility] [place], [place], [place], [address] Phone: [phone]   Fax:  [phone]  Occupational Therapy Screen  Patient Details  Name: Heather Cooper MRN: [id number] Date of Birth: 1931-05-07 No data recorded  Encounter Date: 06/30/2021   OT End of Session - 06/30/21 1654     Visit Number 0             Past Medical History:  Diagnosis Date   Chemotherapy induced nausea and vomiting    Chicken pox    Esophageal cancer ([place])    Hypercalcemia    familial hypocalciuric hypercalcemia   Hypercholesterolemia    Hypertension    Lung cancer ([place])    Osteoporosis    Thyroid disease     Past Surgical History:  Procedure Laterality Date   ABDOMINAL HYSTERECTOMY     partial   CHOLECYSTECTOMY     COLONOSCOPY WITH PROPOFOL N/A 04/17/2017   Procedure: COLONOSCOPY WITH PROPOFOL;  Surgeon: Manya Silvas, MD;  Location: [facility] ENDOSCOPY;  Service: Endoscopy;  Laterality: N/A;   ESOPHAGOGASTRODUODENOSCOPY (EGD) WITH PROPOFOL N/A 04/13/2021   Procedure: ESOPHAGOGASTRODUODENOSCOPY (EGD) WITH PROPOFOL;  Surgeon: [name], MD;  Location: [facility] ENDOSCOPY;  Service: Endoscopy;  Laterality: N/A;   EUS N/A 04/22/2021   Procedure: FULL UPPER ENDOSCOPIC ULTRASOUND (EUS) RADIAL;  Surgeon: Jola Schmidt, MD;  Location: [facility] ENDOSCOPY;  Service: Endoscopy;  Laterality: N/A;  [facility] needed   transvaginal hysterectomy  04/18/05   with anterior colporrhaphy    There were no vitals filed for this visit.   Subjective Assessment - 06/30/21 1653     Subjective  I use my rolling walker - then I can sit when I need to- did not had any falls - I do have the paperwork  application to complete  for them to come and help me at home with some of my bathing and house work                      Pt was seen in chemo  room - unable to do strength and balance assessment - pt report she feels better than she did after she  got fluids when she had seen Josh. Do live alone and using rollator with seat. She  denies falls And request to be seen next week - she is receiving chemo and then every day radiation. Schedule for next week with this OT for rehab screen.                                Visit Diagnosis: Weakness    Problem List Patient Active Problem List   Diagnosis Date Noted   Esophageal cancer ([place]) 05/30/2021   Diarrhea 05/30/2021   Neoplasm of middle third of esophagus 05/10/2021   Stricture and stenosis of esophagus    Dysphagia 01/22/2021   Type 2 diabetes mellitus with hyperglycemia ([place]) 01/19/2021   COVID-19 virus infection 06/21/2020   Anemia 05/11/2020   Malignant neoplasm of right upper lobe of lung ([place]) 08/07/2019   URI (upper respiratory infection) 09/16/2016   Health care maintenance 05/18/2016   History of lung cancer 02/08/2016   Abdominal aortic aneurysm 08/20/2015   Abnormal CXR 03/06/2014   Cough 03/06/2014   Headache(784.0) 03/06/2014   Hyperglycemia 02/07/2013   Hypertension 06/26/2012   Hypercholesterolemia 06/26/2012   Osteoporosis 06/26/2012   Hypercalcemia 06/26/2012   GERD (gastroesophageal  reflux disease) 06/26/2012    Rosalyn Gess, OTR/L,CLT 06/30/2021, 4:54 PM  [facility] at [facility] [place], [place], [place], [address] Phone: [phone]   Fax:  [phone]  Name: Heather Cooper MRN: [id number] Date of Birth: 11/03/1930

## 2021-06-30 NOTE — Telephone Encounter (Signed)
LMTCB

## 2021-06-30 NOTE — Progress Notes (Signed)
Nutrition Follow-up:  Patient with esophageal cancer. Receiving palliative chemotherapy and radiation.   Met with patient during infusion.  Patient reports that her appetite is good and she is eating.  Says she does not have the craving for sweets like she use too.  Feels weak in her legs.  Seen by Gwenette Greet, OT today.  Drinking 1-2 ensure per day.  Eating small meals during the day.  Drinking gatorade as well.  Denies diarrhea or nausea.      Medications: reviewed  Labs: reviewed  Anthropometrics:   Weight 143 lb 8.3 oz today  145 lb 1.6 oz on 1/5 146 lb on 12/20 149 lb on 12/12 148 lb on 12/1 152 lb on 8/26   NUTRITION DIAGNOSIS: Inadequate oral intake stable   INTERVENTION:  Continue ensure plus as often as able Continue high calorie, high protein foods Continue hydration orally    MONITORING, EVALUATION, GOAL: weight trends, intake   NEXT VISIT: Thursday, Feb 2nd phone call  Heather Cooper B. Zenia Resides, [place], [place] Registered Dietitian [phone] (mobile)

## 2021-06-30 NOTE — Telephone Encounter (Signed)
Pt returning your call

## 2021-06-30 NOTE — Patient Instructions (Signed)
[facility] CANCER CTR AT [place]  Discharge Instructions: Thank you for choosing [facility] to provide your oncology and hematology care.  If you have a lab appointment with the [facility], please go directly to the [facility] and check in at the registration area.  Wear comfortable clothing and clothing appropriate for easy access to any Portacath or PICC line.   We strive to give you quality time with your provider. You may need to reschedule your appointment if you arrive late (15 or more minutes).  Arriving late affects you and other patients whose appointments are after yours.  Also, if you miss three or more appointments without notifying the office, you may be dismissed from the clinic at the providers discretion.      For prescription refill requests, have your pharmacy contact our office and allow 72 hours for refills to be completed.    Today you received the following chemotherapy and/or immunotherapy agents Abraxane and Carboplatin        To help prevent nausea and vomiting after your treatment, we encourage you to take your nausea medication as directed.  BELOW ARE SYMPTOMS THAT SHOULD BE REPORTED IMMEDIATELY: *FEVER GREATER THAN 100.4 F (38 C) OR HIGHER *CHILLS OR SWEATING *NAUSEA AND VOMITING THAT IS NOT CONTROLLED WITH YOUR NAUSEA MEDICATION *UNUSUAL SHORTNESS OF BREATH *UNUSUAL BRUISING OR BLEEDING *URINARY PROBLEMS (pain or burning when urinating, or frequent urination) *BOWEL PROBLEMS (unusual diarrhea, constipation, pain near the anus) TENDERNESS IN MOUTH AND THROAT WITH OR WITHOUT PRESENCE OF ULCERS (sore throat, sores in mouth, or a toothache) UNUSUAL RASH, SWELLING OR PAIN  UNUSUAL VAGINAL DISCHARGE OR ITCHING   Items with * indicate a potential emergency and should be followed up as soon as possible or go to the Emergency Department if any problems should occur.  Please show the CHEMOTHERAPY ALERT CARD or IMMUNOTHERAPY ALERT  CARD at check-in to the Emergency Department and triage nurse.  Should you have questions after your visit or need to cancel or reschedule your appointment, please contact [facility] CANCER [place] AT [place]  [phone] and follow the prompts.  Office hours are 8:00 a.m. to 4:30 p.m. Monday - Friday. Please note that voicemails left after 4:00 p.m. may not be returned until the following business day.  We are closed weekends and major holidays. You have access to a nurse at all times for urgent questions. Please call the main number to the clinic [phone] and follow the prompts.  For any non-urgent questions, you may also contact your provider using MyChart. We now offer e-Visits for anyone 81 and older to request care online for non-urgent symptoms. For details visit [email or web address].   Also download the MyChart app! Go to the app store, search "MyChart", open the app, select [place], and log in with your MyChart username and password.  Due to Covid, a mask is required upon entering the hospital/clinic. If you do not have a mask, one will be given to you upon arrival. For doctor visits, patients may have 1 support person aged 70 or older with them. For treatment visits, patients cannot have anyone with them due to current Covid guidelines and our immunocompromised population.

## 2021-07-01 ENCOUNTER — Ambulatory Visit
Admission: RE | Admit: 2021-07-01 | Discharge: 2021-07-01 | Disposition: A | Discharge status: Home / Self Care | Payer: Medicare PPO | Source: Ambulatory Visit | Attending: Radiation Oncology | Admitting: Radiation Oncology

## 2021-07-01 DIAGNOSIS — C771 Secondary and unspecified malignant neoplasm of intrathoracic lymph nodes: Secondary | ICD-10-CM | POA: Diagnosis not present

## 2021-07-01 DIAGNOSIS — Z87891 Personal history of nicotine dependence: Secondary | ICD-10-CM | POA: Diagnosis not present

## 2021-07-01 DIAGNOSIS — C154 Malignant neoplasm of middle third of esophagus: Secondary | ICD-10-CM | POA: Diagnosis not present

## 2021-07-02 ENCOUNTER — Ambulatory Visit
Admission: RE | Admit: 2021-07-02 | Discharge: 2021-07-02 | Disposition: A | Discharge status: Home / Self Care | Payer: Medicare PPO | Source: Ambulatory Visit | Attending: Radiation Oncology | Admitting: Radiation Oncology

## 2021-07-02 DIAGNOSIS — Z87891 Personal history of nicotine dependence: Secondary | ICD-10-CM | POA: Diagnosis not present

## 2021-07-02 DIAGNOSIS — C154 Malignant neoplasm of middle third of esophagus: Secondary | ICD-10-CM | POA: Diagnosis not present

## 2021-07-02 DIAGNOSIS — C771 Secondary and unspecified malignant neoplasm of intrathoracic lymph nodes: Secondary | ICD-10-CM | POA: Diagnosis not present

## 2021-07-02 NOTE — Telephone Encounter (Signed)
Patient confirmed nothing further needed. She just needs letter.

## 2021-07-03 NOTE — Telephone Encounter (Signed)
Letter has been typed.

## 2021-07-05 ENCOUNTER — Ambulatory Visit
Admission: RE | Admit: 2021-07-05 | Discharge: 2021-07-05 | Disposition: A | Discharge status: Home / Self Care | Payer: Medicare PPO | Source: Ambulatory Visit | Attending: Radiation Oncology | Admitting: Radiation Oncology

## 2021-07-05 DIAGNOSIS — Z87891 Personal history of nicotine dependence: Secondary | ICD-10-CM | POA: Diagnosis not present

## 2021-07-05 DIAGNOSIS — C154 Malignant neoplasm of middle third of esophagus: Secondary | ICD-10-CM | POA: Diagnosis not present

## 2021-07-05 DIAGNOSIS — C771 Secondary and unspecified malignant neoplasm of intrathoracic lymph nodes: Secondary | ICD-10-CM | POA: Diagnosis not present

## 2021-07-05 NOTE — Telephone Encounter (Signed)
Printed for signature. Will place up front for patient.

## 2021-07-06 ENCOUNTER — Ambulatory Visit
Admission: RE | Admit: 2021-07-06 | Discharge: 2021-07-06 | Disposition: A | Discharge status: Home / Self Care | Payer: Medicare PPO | Source: Ambulatory Visit | Attending: Radiation Oncology | Admitting: Radiation Oncology

## 2021-07-06 DIAGNOSIS — C154 Malignant neoplasm of middle third of esophagus: Secondary | ICD-10-CM | POA: Diagnosis not present

## 2021-07-06 DIAGNOSIS — C771 Secondary and unspecified malignant neoplasm of intrathoracic lymph nodes: Secondary | ICD-10-CM | POA: Diagnosis not present

## 2021-07-06 DIAGNOSIS — Z87891 Personal history of nicotine dependence: Secondary | ICD-10-CM | POA: Diagnosis not present

## 2021-07-07 ENCOUNTER — Inpatient Hospital Stay (HOSPITAL_BASED_OUTPATIENT_CLINIC_OR_DEPARTMENT_OTHER): Payer: Medicare PPO | Admitting: Internal Medicine

## 2021-07-07 ENCOUNTER — Inpatient Hospital Stay: Payer: Medicare PPO

## 2021-07-07 ENCOUNTER — Ambulatory Visit
Admission: RE | Admit: 2021-07-07 | Discharge: 2021-07-07 | Disposition: A | Discharge status: Home / Self Care | Payer: Medicare PPO | Source: Ambulatory Visit | Attending: Radiation Oncology | Admitting: Radiation Oncology

## 2021-07-07 ENCOUNTER — Inpatient Hospital Stay: Payer: Medicare PPO | Admitting: Occupational Therapy

## 2021-07-07 ENCOUNTER — Other Ambulatory Visit: Payer: Self-pay

## 2021-07-07 ENCOUNTER — Encounter: Payer: Self-pay | Admitting: Internal Medicine

## 2021-07-07 VITALS — BP 135/74 | HR 98 | Temp 98.3°F | Ht 63.0 in | Wt 142.0 lb

## 2021-07-07 DIAGNOSIS — D649 Anemia, unspecified: Secondary | ICD-10-CM | POA: Diagnosis not present

## 2021-07-07 DIAGNOSIS — R296 Repeated falls: Secondary | ICD-10-CM

## 2021-07-07 DIAGNOSIS — Z5111 Encounter for antineoplastic chemotherapy: Secondary | ICD-10-CM | POA: Diagnosis not present

## 2021-07-07 DIAGNOSIS — N179 Acute kidney failure, unspecified: Secondary | ICD-10-CM | POA: Diagnosis not present

## 2021-07-07 DIAGNOSIS — D490 Neoplasm of unspecified behavior of digestive system: Secondary | ICD-10-CM

## 2021-07-07 DIAGNOSIS — C154 Malignant neoplasm of middle third of esophagus: Secondary | ICD-10-CM | POA: Diagnosis not present

## 2021-07-07 DIAGNOSIS — N390 Urinary tract infection, site not specified: Secondary | ICD-10-CM | POA: Diagnosis not present

## 2021-07-07 DIAGNOSIS — R531 Weakness: Secondary | ICD-10-CM

## 2021-07-07 DIAGNOSIS — Z79899 Other long term (current) drug therapy: Secondary | ICD-10-CM | POA: Diagnosis not present

## 2021-07-07 DIAGNOSIS — C771 Secondary and unspecified malignant neoplasm of intrathoracic lymph nodes: Secondary | ICD-10-CM | POA: Diagnosis not present

## 2021-07-07 DIAGNOSIS — Z87891 Personal history of nicotine dependence: Secondary | ICD-10-CM | POA: Diagnosis not present

## 2021-07-07 LAB — CBC WITH DIFFERENTIAL/PLATELET
Abs Immature Granulocytes: 0.02 10*3/uL (ref 0.00–0.07)
Basophils Absolute: 0 10*3/uL (ref 0.0–0.1)
Basophils Relative: 1 %
Eosinophils Absolute: 0 10*3/uL (ref 0.0–0.5)
Eosinophils Relative: 1 %
HCT: 26.9 % — ABNORMAL LOW (ref 36.0–46.0)
Hemoglobin: 9.1 g/dL — ABNORMAL LOW (ref 12.0–15.0)
Immature Granulocytes: 1 %
Lymphocytes Relative: 29 %
Lymphs Abs: 0.6 10*3/uL — ABNORMAL LOW (ref 0.7–4.0)
MCH: 30.1 pg (ref 26.0–34.0)
MCHC: 33.8 g/dL (ref 30.0–36.0)
MCV: 89.1 fL (ref 80.0–100.0)
Monocytes Absolute: 0.2 10*3/uL (ref 0.1–1.0)
Monocytes Relative: 11 %
Neutro Abs: 1.2 10*3/uL — ABNORMAL LOW (ref 1.7–7.7)
Neutrophils Relative %: 57 %
Platelets: 192 10*3/uL (ref 150–400)
RBC: 3.02 MIL/uL — ABNORMAL LOW (ref 3.87–5.11)
RDW: 14 % (ref 11.5–15.5)
WBC: 2 10*3/uL — ABNORMAL LOW (ref 4.0–10.5)
nRBC: 0 % (ref 0.0–0.2)

## 2021-07-07 LAB — COMPREHENSIVE METABOLIC PANEL
ALT: 19 U/L (ref 0–44)
AST: 21 U/L (ref 15–41)
Albumin: 3.9 g/dL (ref 3.5–5.0)
Alkaline Phosphatase: 63 U/L (ref 38–126)
Anion gap: 8 (ref 5–15)
BUN: 12 mg/dL (ref 8–23)
CO2: 27 mmol/L (ref 22–32)
Calcium: 9.4 mg/dL (ref 8.9–10.3)
Chloride: 101 mmol/L (ref 98–111)
Creatinine, Ser: 0.84 mg/dL (ref 0.44–1.00)
GFR, Estimated: >60 mL/min (ref >60)
Glucose, Bld: 114 mg/dL — ABNORMAL HIGH (ref 70–99)
Potassium: 4 mmol/L (ref 3.5–5.1)
Sodium: 136 mmol/L (ref 135–145)
Total Bilirubin: 0.6 mg/dL (ref 0.3–1.2)
Total Protein: 6.8 g/dL (ref 6.5–8.1)

## 2021-07-07 MED ORDER — SODIUM CHLORIDE 0.9 % IV SOLN
Freq: Once | INTRAVENOUS | Status: AC
  Filled 2021-07-07: qty 250

## 2021-07-07 MED ORDER — SODIUM CHLORIDE 0.9 % IV SOLN
10.0000 mg | Freq: Once | INTRAVENOUS | Status: AC
  Administered 2021-07-07: 11:00:00 10 mg via INTRAVENOUS
  Filled 2021-07-07: qty 10

## 2021-07-07 MED ORDER — PALONOSETRON HCL INJECTION 0.25 MG/5ML
0.2500 mg | Freq: Once | INTRAVENOUS | Status: AC
  Administered 2021-07-07: 11:00:00 0.25 mg via INTRAVENOUS
  Filled 2021-07-07: qty 5

## 2021-07-07 MED ORDER — SODIUM CHLORIDE 0.9 % IV SOLN
132.8000 mg | Freq: Once | INTRAVENOUS | Status: AC
  Administered 2021-07-07: 13:00:00 130 mg via INTRAVENOUS
  Filled 2021-07-07: qty 13

## 2021-07-07 MED ORDER — PACLITAXEL PROTEIN-BOUND CHEMO INJECTION 100 MG
100.0000 mg/m2 | Freq: Once | INTRAVENOUS | Status: AC
  Administered 2021-07-07: 11:00:00 175 mg via INTRAVENOUS
  Filled 2021-07-07: qty 35

## 2021-07-07 NOTE — Therapy (Signed)
[facility] at [place], [place], [place], [address] Phone: [phone]   Fax:  [phone]  Occupational Therapy Screen:  Patient Details  Name: Heather Cooper MRN: [id number] Date of Birth: Mar 25, 1931 No data recorded  Encounter Date: 07/07/2021   OT End of Session - 07/07/21 1521     Visit Number 0             Past Medical History:  Diagnosis Date   Chemotherapy induced nausea and vomiting    Chicken pox    Esophageal cancer ([place])    Hypercalcemia    familial hypocalciuric hypercalcemia   Hypercholesterolemia    Hypertension    Lung cancer ([place])    Osteoporosis    Thyroid disease     Past Surgical History:  Procedure Laterality Date   ABDOMINAL HYSTERECTOMY     partial   CHOLECYSTECTOMY     COLONOSCOPY WITH PROPOFOL N/A 04/17/2017   Procedure: COLONOSCOPY WITH PROPOFOL;  Surgeon: Manya Silvas, MD;  Location: [facility] ENDOSCOPY;  Service: Endoscopy;  Laterality: N/A;   ESOPHAGOGASTRODUODENOSCOPY (EGD) WITH PROPOFOL N/A 04/13/2021   Procedure: ESOPHAGOGASTRODUODENOSCOPY (EGD) WITH PROPOFOL;  Surgeon: [name], MD;  Location: [facility] ENDOSCOPY;  Service: Endoscopy;  Laterality: N/A;   EUS N/A 04/22/2021   Procedure: FULL UPPER ENDOSCOPIC ULTRASOUND (EUS) RADIAL;  Surgeon: Jola Schmidt, MD;  Location: [facility] ENDOSCOPY;  Service: Endoscopy;  Laterality: N/A;  [facility] needed   transvaginal hysterectomy  04/18/05   with anterior colporrhaphy   OT SCREEN 07/07/21:   Subjective Assessment - 07/07/21 1519     Subjective  Before chemo about 6 wks ago -I could walk up and down the stairs, walk a lot - and did not use cane or walker -but after that infection and then needed fluids - my legs are so weak -and afraid of falling - do have this walker with seat and use furniture    Currently in Pain? No/denies             Pt accompany by daughter. Pt walk in with Rolator- shuffling her feet. Pt show decrease  strength in L > R LE , and strength in  hips and knee extention decreased Pt show more fear of falling - when she holds on to counter or walker she can stand on one leg, or alternate placing feet on stool She scored on BERG balance test 23/56 - med risk for falling - use her walker or furniture mostly Hard time marching - fatigue and need cueing Able to do heal lifts but not toe lifts Doing better with using one hand to push up and reach back to sit -  fear if not using hand  10 m walking test -18 sec with Rolator - shuffling feet Pt lives alone -daughter and son checking in on her and sleeping with her some nights She has walk in shower with chair and hand held shower  Great neighbors close by   Provided some HEP until [facility] PT - sit <> stand on hand 10 reps 2 sets  Marching at counter 30 sec - x 3  Heel lifts 10 reps -2 sets Walking some in the house with the walker Do hip ABD standing holding on the counter - tap with toes - 10  reps L and R   Discuss with pt and daughter - and in agreement pt in need for [facility] PT eval - discuss with Dr Rogue Bussing -and did order  Pt  to follow up with me as needed                                           Visit Diagnosis: Frequent falls  Weakness    Problem List Patient Active Problem List   Diagnosis Date Noted   Esophageal cancer ([place]) 05/30/2021   Diarrhea 05/30/2021   Neoplasm of middle third of esophagus 05/10/2021   Stricture and stenosis of esophagus    Dysphagia 01/22/2021   Type 2 diabetes mellitus with hyperglycemia ([place]) 01/19/2021   COVID-19 virus infection 06/21/2020   Anemia 05/11/2020   Malignant neoplasm of right upper lobe of lung ([place]) 08/07/2019   URI (upper respiratory infection) 09/16/2016   Health care maintenance 05/18/2016   History of lung cancer 02/08/2016   Abdominal aortic aneurysm 08/20/2015   Abnormal CXR 03/06/2014   Cough 03/06/2014   Headache(784.0) 03/06/2014    Hyperglycemia 02/07/2013   Hypertension 06/26/2012   Hypercholesterolemia 06/26/2012   Osteoporosis 06/26/2012   Hypercalcemia 06/26/2012   GERD (gastroesophageal reflux disease) 06/26/2012    Rosalyn Gess, OTR/L,CLT 07/07/2021, 3:22 PM  [facility] at [facility] [address], [place], [place], [address] Phone: [phone]   Fax:  [phone]  Name: Heather Cooper MRN: [id number] Date of Birth: 1930-08-22

## 2021-07-07 NOTE — Progress Notes (Signed)
Nutrition Follow-up:  Patient with esophageal cancer.  Receiving palliative chemotherapy and radiation (last treatments today).  Met with patient during infusion.  Patient reports that her appetite is good and she has really been trying to eat good and stay hydrated.  Continues to drink ensure daily.  Denies nausea or diarrhea.  Says that she is chopping foods and chewing foods well before swallowing.  She is not avoiding any particular foods at this time due to concerns of swallowing    Medications: reviewed  Labs: reviewed  Anthropometrics:   Weight 142 lb   143 lb on 1/18 145 lb 1.6 oz on 1/5 146 lb on 12/20 149 lb on 12/12 148 lb on 12/1 152 lb on 8/26   NUTRITION DIAGNOSIS: Inadequate oral intake   INTERVENTION:  Continue high calorie, high protein foods Continue oral nutrition supplement    MONITORING, EVALUATION, GOAL: weight trends, intake   NEXT VISIT: Wednesday, Feb 8 during infusion  Izumi Mixon B. Zenia Resides, [place], [place] Registered Dietitian [phone] (mobile)

## 2021-07-07 NOTE — Assessment & Plan Note (Addendum)
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  Currently on concurrent chemoradiation-for locally advanced squamous cell cancer of the esophagus.  # Proceed with #6 weekly treatments of Carbo-Abraxane with radiation. Labs today reviewed;  acceptable for treatment today- NAC1.2; Hb 9.2;  [plan a total of 6- RT until Jan 25 th].  Discussed with the patient daughter that patient has done reasonably well with her treatment so far.  However I expect to have side effects of increasing fatigue/difficulty swallowing over the next few weeks.  Consider IV fluids/Carafate if needed.  Discussed that we will plan to get repeat imaging in 6 to 8 weeks posttreatment.  # DEC 2022- ARF- 1.4- improved;  today- creatinine- 0.8.  Continue to hold HOLD off BP medications;  # Mild  Anemia- improved- Hb 9-10- sec to chemo-STABLE.  # FBG- 114-STABLE-  Monitor closely on chemotherapy.  #Debility/risk of falls-discussed with Gwenette Greet; recommend home physical therapy.  Ordered.  # DISPOSITION:  # refer to PT home re: debility/falls # chemo today # 1 week- labs-possible IVFs over 1 hour  # Follow up in 2 weeks- MD; labs- cbc/cmp; possible IVfs;-Dr.B  Cc; Dr.Scott

## 2021-07-07 NOTE — Progress Notes (Signed)
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  Starting radiation and chemotherapy from April 14, 2014 Patient was started on carboplatinum andTaxol Herve were developed an allergic reaction to Taxol so would be changed to Abraxane 3.patient has finished 6 cycles of weekly chemotherapy with carboplatinum  and radiation therapy(May 28, 2014) 4.started on  NIVOLULAMAB because of persistent disease July 02, 2014. 5.  NIVOLULAMAB was discontinued because of persistent diarrhea in July of 2016.  August of 2016 CT scan was stable so no further chemotherapy  # AUG 25th PET- STABLE RUL MASS [radiation fibrosis; <1cm ? Mediastinal recurrence];   # DEC 2022-squamous cell carcinoma of the midesophagus -carbo Abraxane weekly with radiation  # AAA/ 3.3x 3.9 Stable [Dr.Schnier] -----------------------------------------------------       History of lung cancer  Malignant neoplasm of right upper lobe of lung ([place])  08/07/2019 Initial Diagnosis   Malignant neoplasm of right upper lobe of lung ([place])    INTERVAL HISTORY: Ambulating with a rolling walker.  Accompanied by her daughter.  LESHAE MCCLAY 88 y.o.  female pleasant patient newly  diagnosed squamous cell carcinoma of the midesophagus-locally advanced on definitive [carbo-abraxane]chemoradiation is here for follow-up.  Patient denies any worsening fatigue or chest pain or shortness of breath or fevers or chills.  She has been drinking of fluids.  Denies any tingling or numbness.  Review of Systems  Constitutional:  Positive for malaise/fatigue. Negative for chills, diaphoresis, fever and weight loss.  HENT:  Negative for nosebleeds and sore throat.   Eyes:  Negative for double vision.  Respiratory:  Negative for hemoptysis, sputum production, shortness of breath and wheezing.   Cardiovascular:  Negative for chest pain, palpitations, orthopnea and leg swelling.  Gastrointestinal:  Negative for abdominal pain, blood in stool, constipation, diarrhea, heartburn, melena, nausea and vomiting.  Genitourinary:  Negative for dysuria, frequency and urgency.  Musculoskeletal:  Negative for back pain and joint pain.  Skin: Negative.  Negative for itching and rash.  Neurological:  Negative for dizziness, tingling, focal weakness, weakness and headaches.  Endo/Heme/Allergies:  Does not bruise/bleed easily.  Psychiatric/Behavioral:  Negative for depression. The patient is not nervous/anxious and does not have insomnia.     PAST MEDICAL HISTORY :  Past Medical History:  Diagnosis Date   Chemotherapy induced nausea and vomiting    Chicken pox    Esophageal cancer (H[facility])    Hypercalcemia    familial hypocalciuric hypercalcemia   Hypercholesterolemia    Hypertension    Lung cancer ([place])    Osteoporosis    Thyroid disease     PAST SURGICAL HISTORY :   Past Surgical History:  Procedure Laterality Date   ABDOMINAL HYSTERECTOMY     partial   CHOLECYSTECTOMY  COLONOSCOPY WITH PROPOFOL N/A 04/17/2017   Procedure: COLONOSCOPY WITH PROPOFOL;  Surgeon: Manya Silvas, MD;  Location: [facility] ENDOSCOPY;  Service: Endoscopy;  Laterality: N/A;   ESOPHAGOGASTRODUODENOSCOPY (EGD) WITH  PROPOFOL N/A 04/13/2021   Procedure: ESOPHAGOGASTRODUODENOSCOPY (EGD) WITH PROPOFOL;  Surgeon: [name], MD;  Location: [facility] ENDOSCOPY;  Service: Endoscopy;  Laterality: N/A;   EUS N/A 04/22/2021   Procedure: FULL UPPER ENDOSCOPIC ULTRASOUND (EUS) RADIAL;  Surgeon: Jola Schmidt, MD;  Location: [facility] ENDOSCOPY;  Service: Endoscopy;  Laterality: N/A;  [facility] needed   transvaginal hysterectomy  04/18/05   with anterior colporrhaphy    FAMILY HISTORY :   Family History  Problem Relation Age of Onset   Stroke Mother    Hypertension Mother    Prostate cancer Father    Cancer Father        prostate   Heart disease Brother        s/p CABG   Colon cancer Neg Hx     SOCIAL HISTORY:   Social History   Tobacco Use   Smoking status: Former    Types: Cigarettes    Quit date: 06/13/1997    Years since quitting: 24.0   Smokeless tobacco: Never  Vaping Use   Vaping Use: Never used  Substance Use Topics   Alcohol use: No    Alcohol/week: 0.0 standard drinks   Drug use: No    ALLERGIES:  is allergic to paclitaxel.  MEDICATIONS:  Current Outpatient Medications  Medication Sig Dispense Refill   cholecalciferol (VITAMIN D3) 25 MCG (1000 UNIT) tablet Take 1,000 Units by mouth daily.     dexamethasone (DECADRON) 4 MG tablet Take 2 tablets (8 mg total) by mouth daily. Start the day after chemotherapy for 2 days. 30 tablet 1   diphenoxylate-atropine (LOMOTIL) 2.5-0.025 MG tablet Take 1 tablet by mouth 4 (four) times daily as needed for diarrhea or loose stools. Take it along with immodium 30 tablet 0   fluticasone (FLONASE) 50 MCG/ACT nasal spray Place 2 sprays into both nostrils daily. 16 g 3   levocetirizine (XYZAL) 5 MG tablet Take 1 tablet (5 mg total) by mouth every evening. 90 tablet 2   LORazepam (ATIVAN) 0.5 MG tablet Take 1 tablet (0.5 mg total) by mouth every 6 (six) hours as needed (Nausea or vomiting). 30 tablet 0   ondansetron (ZOFRAN) 8 MG tablet Take 1 tablet (8 mg total) by  mouth 2 (two) times daily as needed for refractory nausea / vomiting. Start on day 3 after chemo. 30 tablet 1   pantoprazole (PROTONIX) 40 MG tablet TAKE 1 TABLET BY MOUTH ONCE DAILY 90 tablet 3   prochlorperazine (COMPAZINE) 10 MG tablet Take 1 tablet (10 mg total) by mouth every 6 (six) hours as needed (Nausea or vomiting). 30 tablet 1   Ascorbic Acid (VITAMIN C) 1000 MG tablet Take 1,000 mg by mouth daily. (Patient not taking: Reported on 06/23/2021)     olmesartan-hydrochlorothiazide (BENICAR HCT) 20-12.5 MG tablet TAKE 1 TABLET BY MOUTH ONCE DAILY (Patient not taking: Reported on 07/07/2021) 90 tablet 1   No current facility-administered medications for this visit.   Facility-Administered Medications Ordered in Other Visits  Medication Dose Route Frequency Provider Last Rate Last Admin   CARBOplatin (PARAPLATIN) 130 mg in sodium chloride 0.9 % 100 mL chemo infusion  130 mg Intravenous Once Cammie Sickle, MD       PACLitaxel-protein bound (ABRAXANE) chemo infusion 175 mg  100 mg/m2 (Treatment Plan Recorded) Intravenous Once Charlaine Dalton  R, MD 70 mL/hr at 07/07/21 1123 175 mg at 07/07/21 1123   sodium chloride 0.9 % 1,000 mL with potassium chloride 20 mEq, magnesium sulfate 2 g infusion   Intravenous Continuous Forest Gleason, MD   Stopped at 12/04/14 1300    PHYSICAL EXAMINATION: ECOG PERFORMANCE STATUS: 0 - Asymptomatic  BP 135/74 (BP Location: Left Arm, Patient Position: Sitting, Cuff Size: Normal)    Pulse 98    Temp 98.3 F (36.8 C) (Tympanic)    Ht 5' 3"  (1.6 m)    Wt 142 lb (64.4 kg)    SpO2 100%    BMI 25.15 kg/m   Filed Weights   07/07/21 0935  Weight: 142 lb (64.4 kg)    Physical Exam HENT:     Head: Normocephalic and atraumatic.     Mouth/Throat:     Pharynx: No oropharyngeal exudate.  Eyes:     Pupils: Pupils are equal, round, and reactive to light.  Cardiovascular:     Rate and Rhythm: Normal rate and regular rhythm.  Pulmonary:     Effort: Pulmonary  effort is normal. No respiratory distress.     Breath sounds: Normal breath sounds. No wheezing.  Abdominal:     General: Bowel sounds are normal. There is no distension.     Palpations: Abdomen is soft. There is no mass.     Tenderness: There is no abdominal tenderness. There is no guarding or rebound.  Musculoskeletal:        General: No tenderness. Normal range of motion.     Cervical back: Normal range of motion and neck supple.  Skin:    General: Skin is warm.  Neurological:     Mental Status: She is alert and oriented to person, place, and time.  Psychiatric:        Mood and Affect: Affect normal.     LABORATORY DATA:  I have reviewed the data as listed    Component Value Date/Time   NA 136 07/07/2021 0916   NA 130 (L) 10/09/2014 0846   K 4.0 07/07/2021 0916   K 4.1 10/09/2014 0846   CL 101 07/07/2021 0916   CL 98 (L) 10/09/2014 0846   CO2 27 07/07/2021 0916   CO2 25 10/09/2014 0846   GLUCOSE 114 (H) 07/07/2021 0916   GLUCOSE 161 (H) 10/09/2014 0846   BUN 12 07/07/2021 0916   BUN 14 10/09/2014 0846   CREATININE 0.84 07/07/2021 0916   CREATININE 0.70 10/09/2014 0846   CALCIUM 9.4 07/07/2021 0916   CALCIUM 9.1 10/09/2014 0846   PROT 6.8 07/07/2021 0916   PROT 7.3 10/09/2014 0846   ALBUMIN 3.9 07/07/2021 0916   ALBUMIN 3.6 10/09/2014 0846   AST 21 07/07/2021 0916   AST 16 10/09/2014 0846   ALT 19 07/07/2021 0916   ALT 13 (L) 10/09/2014 0846   ALKPHOS 63 07/07/2021 0916   ALKPHOS 70 10/09/2014 0846   BILITOT 0.6 07/07/2021 0916   BILITOT 1.0 10/09/2014 0846   GFRNONAA >60 07/07/2021 0916   GFRNONAA >60 10/09/2014 0846   GFRAA >60 02/05/2020 1004   GFRAA >60 10/09/2014 0846    No results found for: SPEP, UPEP  Lab Results  Component Value Date   WBC 2.0 (L) 07/07/2021   NEUTROABS 1.2 (L) 07/07/2021   HGB 9.1 (L) 07/07/2021   HCT 26.9 (L) 07/07/2021   MCV 89.1 07/07/2021   PLT 192 07/07/2021      Chemistry      Component Value Date/Time  NA 136  07/07/2021 0916   NA 130 (L) 10/09/2014 0846   K 4.0 07/07/2021 0916   K 4.1 10/09/2014 0846   CL 101 07/07/2021 0916   CL 98 (L) 10/09/2014 0846   CO2 27 07/07/2021 0916   CO2 25 10/09/2014 0846   BUN 12 07/07/2021 0916   BUN 14 10/09/2014 0846   CREATININE 0.84 07/07/2021 0916   CREATININE 0.70 10/09/2014 0846   GLU 111 03/18/2014 1048      Component Value Date/Time   CALCIUM 9.4 07/07/2021 0916   CALCIUM 9.1 10/09/2014 0846   ALKPHOS 63 07/07/2021 0916   ALKPHOS 70 10/09/2014 0846   AST 21 07/07/2021 0916   AST 16 10/09/2014 0846   ALT 19 07/07/2021 0916   ALT 13 (L) 10/09/2014 0846   BILITOT 0.6 07/07/2021 0916   BILITOT 1.0 10/09/2014 0846       RADIOGRAPHIC STUDIES: I have personally reviewed the radiological images as listed and agreed with the findings in the report. No results found.    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  Currently on concurrent chemoradiation-for locally advanced squamous cell cancer of the esophagus.  # Proceed with #6 weekly treatments of Carbo-Abraxane with radiation. Labs today reviewed;  acceptable for treatment today- NAC1.2; Hb 9.2;  [plan a total of 6- RT until Jan 25 th].  Discussed with the patient daughter that patient has done reasonably well with her treatment so far.  However I expect to have side effects of increasing fatigue/difficulty swallowing over the next few weeks.  Consider IV fluids/Carafate if needed.  Discussed that we will plan to get repeat imaging in 6 to 8 weeks posttreatment.  # DEC 2022- ARF- 1.4- improved;  today- creatinine- 0.8.  Continue to hold HOLD off BP medications;  # Mild  Anemia- improved- Hb 9-10- sec to chemo-STABLE.  # FBG- 114-STABLE-  Monitor closely on chemotherapy.  #Debility/risk of falls-discussed with Gwenette Greet; recommend home physical  therapy.  Ordered.  # DISPOSITION:  # refer to PT home re: debility/falls # chemo today # 1 week- labs-possible IVFs over 1 hour  # Follow up in 2 weeks- MD; labs- cbc/cmp; possible IVfs;-Dr.B  Cc; Dr.Scott    Orders Placed This Encounter  Procedures   Ambulatory referral to Center    Referral Priority:   Routine    Referral Type:   Home Health Care    Referral Reason:   Specialty Services Required    Requested Specialty:   [place]    Number of Visits Requested:   1    All questions were answered. The patient knows to call the clinic with any problems, questions or concerns.      Cammie Sickle, MD 07/07/2021 11:43 AM

## 2021-07-07 NOTE — Progress Notes (Signed)
Needs refill of her ativan.  Should she start back the benicar and vit c?

## 2021-07-07 NOTE — Patient Instructions (Signed)
[facility] AT [place]  Discharge Instructions: Thank you for choosing [facility] to provide your oncology and hematology care.  If you have a lab appointment with the [facility], please go directly to the [facility] and check in at the registration area.  Wear comfortable clothing and clothing appropriate for easy access to any Portacath or PICC line.   We strive to give you quality time with your provider. You may need to reschedule your appointment if you arrive late (15 or more minutes).  Arriving late affects you and other patients whose appointments are after yours.  Also, if you miss three or more appointments without notifying the office, you may be dismissed from the clinic at the providers discretion.      For prescription refill requests, have your pharmacy contact our office and allow 72 hours for refills to be completed.    Today you received the following chemotherapy and/or immunotherapy agents carboplatin, abraxane      To help prevent nausea and vomiting after your treatment, we encourage you to take your nausea medication as directed.  BELOW ARE SYMPTOMS THAT SHOULD BE REPORTED IMMEDIATELY: *FEVER GREATER THAN 100.4 F (38 C) OR HIGHER *CHILLS OR SWEATING *NAUSEA AND VOMITING THAT IS NOT CONTROLLED WITH YOUR NAUSEA MEDICATION *UNUSUAL SHORTNESS OF BREATH *UNUSUAL BRUISING OR BLEEDING *URINARY PROBLEMS (pain or burning when urinating, or frequent urination) *BOWEL PROBLEMS (unusual diarrhea, constipation, pain near the anus) TENDERNESS IN MOUTH AND THROAT WITH OR WITHOUT PRESENCE OF ULCERS (sore throat, sores in mouth, or a toothache) UNUSUAL RASH, SWELLING OR PAIN  UNUSUAL VAGINAL DISCHARGE OR ITCHING   Items with * indicate a potential emergency and should be followed up as soon as possible or go to the Emergency Department if any problems should occur.  Please show the CHEMOTHERAPY ALERT CARD or IMMUNOTHERAPY ALERT CARD at  check-in to the Emergency Department and triage nurse.  Should you have questions after your visit or need to cancel or reschedule your appointment, please contact [facility] [place] [facility] [place]  [phone] and follow the prompts.  Office hours are 8:00 a.m. to 4:30 p.m. Monday - Friday. Please note that voicemails left after 4:00 p.m. may not be returned until the following business day.  We are closed weekends and major holidays. You have access to a nurse at all times for urgent questions. Please call the main number to the clinic [phone] and follow the prompts.  For any non-urgent questions, you may also contact your provider using MyChart. We now offer e-Visits for anyone 27 and older to request care online for non-urgent symptoms. For details visit [email or web address].   Also download the MyChart app! Go to the app store, search "MyChart", open the app, select [facility], and log in with your MyChart username and password.  Due to Covid, a mask is required upon entering the hospital/clinic. If you do not have a mask, one will be given to you upon arrival. For doctor visits, patients may have 1 support person aged 70 or older with them. For treatment visits, patients cannot have anyone with them due to current Covid guidelines and our immunocompromised population.   Nanoparticle Albumin-Bound Paclitaxel injection What is this medication? NANOPARTICLE ALBUMIN-BOUND PACLITAXEL (Na no PAHR ti kuhl  al BYOO muhn-bound  PAK li TAX el) is a chemotherapy drug. It targets fast dividing cells, like cancer cells, and causes these cells to die. This medicine is used to treat advanced breast cancer, lung cancer,  and pancreatic cancer. This medicine may be used for other purposes; ask your health care provider or pharmacist if you have questions. COMMON BRAND NAME(S): Abraxane What should I tell my care team before I take this medication? They need to know if you have any of these  conditions: kidney disease liver disease low blood counts, like low white cell, platelet, or red cell counts lung or breathing disease, like asthma tingling of the fingers or toes, or other nerve disorder an unusual or allergic reaction to paclitaxel, albumin, other chemotherapy, other medicines, foods, dyes, or preservatives pregnant or trying to get pregnant breast-feeding How should I use this medication? This drug is given as an infusion into a vein. It is administered in a hospital or clinic by a specially trained health care professional. Talk to your pediatrician regarding the use of this medicine in children. Special care may be needed. Overdosage: If you think you have taken too much of this medicine contact a poison control center or emergency room at once. NOTE: This medicine is only for you. Do not share this medicine with others. What if I miss a dose? It is important not to miss your dose. Call your doctor or health care professional if you are unable to keep an appointment. What may interact with this medication? This medicine may interact with the following medications: antiviral medicines for hepatitis, HIV or AIDS certain antibiotics like erythromycin and clarithromycin certain medicines for fungal infections like ketoconazole and itraconazole certain medicines for seizures like carbamazepine, phenobarbital, phenytoin gemfibrozil nefazodone rifampin St. John's wort This list may not describe all possible interactions. Give your health care provider a list of all the medicines, herbs, non-prescription drugs, or dietary supplements you use. Also tell them if you smoke, drink alcohol, or use illegal drugs. Some items may interact with your medicine. What should I watch for while using this medication? Your condition will be monitored carefully while you are receiving this medicine. You will need important blood work done while you are taking this medicine. This medicine  can cause serious allergic reactions. If you experience allergic reactions like skin rash, itching or hives, swelling of the face, lips, or tongue, tell your doctor or health care professional right away. In some cases, you may be given additional medicines to help with side effects. Follow all directions for their use. This drug may make you feel generally unwell. This is not uncommon, as chemotherapy can affect healthy cells as well as cancer cells. Report any side effects. Continue your course of treatment even though you feel ill unless your doctor tells you to stop. Call your doctor or health care professional for advice if you get a fever, chills or sore throat, or other symptoms of a cold or flu. Do not treat yourself. This drug decreases your body's ability to fight infections. Try to avoid being around people who are sick. This medicine may increase your risk to bruise or bleed. Call your doctor or health care professional if you notice any unusual bleeding. Be careful brushing and flossing your teeth or using a toothpick because you may get an infection or bleed more easily. If you have any dental work done, tell your dentist you are receiving this medicine. Avoid taking products that contain aspirin, acetaminophen, ibuprofen, naproxen, or ketoprofen unless instructed by your doctor. These medicines may hide a fever. Do not become pregnant while taking this medicine or for 6 months after stopping it. Women should inform their doctor if they wish to  become pregnant or think they might be pregnant. Men should not father a child while taking this medicine or for 3 months after stopping it. There is a potential for serious side effects to an unborn child. Talk to your health care professional or pharmacist for more information. Do not breast-feed an infant while taking this medicine or for 2 weeks after stopping it. This medicine may interfere with the ability to get pregnant or to father a child. You  should talk to your doctor or health care professional if you are concerned about your fertility. What side effects may I notice from receiving this medication? Side effects that you should report to your doctor or health care professional as soon as possible: allergic reactions like skin rash, itching or hives, swelling of the face, lips, or tongue breathing problems changes in vision fast, irregular heartbeat low blood pressure mouth sores pain, tingling, numbness in the hands or feet signs of decreased platelets or bleeding - bruising, pinpoint red spots on the skin, black, tarry stools, blood in the urine signs of decreased red blood cells - unusually weak or tired, feeling faint or lightheaded, falls signs of infection - fever or chills, cough, sore throat, pain or difficulty passing urine signs and symptoms of liver injury like dark yellow or brown urine; general ill feeling or flu-like symptoms; light-colored stools; loss of appetite; nausea; right upper belly pain; unusually weak or tired; yellowing of the eyes or skin swelling of the ankles, feet, hands unusually slow heartbeat Side effects that usually do not require medical attention (report to your doctor or health care professional if they continue or are bothersome): diarrhea hair loss loss of appetite nausea, vomiting tiredness This list may not describe all possible side effects. Call your doctor for medical advice about side effects. You may report side effects to FDA at 1-800-FDA-1088. Where should I keep my medication? This drug is given in a hospital or clinic and will not be stored at home. NOTE: This sheet is a summary. It may not cover all possible information. If you have questions about this medicine, talk to your doctor, pharmacist, or health care provider.  2022 Elsevier/Gold Standard (2017-01-31 00:00:00)   Carboplatin injection What is this medication? CARBOPLATIN (KAR boe pla tin) is a chemotherapy drug.  It targets fast dividing cells, like cancer cells, and causes these cells to die. This medicine is used to treat ovarian cancer and many other cancers. This medicine may be used for other purposes; ask your health care provider or pharmacist if you have questions. COMMON BRAND NAME(S): Paraplatin What should I tell my care team before I take this medication? They need to know if you have any of these conditions: blood disorders hearing problems kidney disease recent or ongoing radiation therapy an unusual or allergic reaction to carboplatin, cisplatin, other chemotherapy, other medicines, foods, dyes, or preservatives pregnant or trying to get pregnant breast-feeding How should I use this medication? This drug is usually given as an infusion into a vein. It is administered in a hospital or clinic by a specially trained health care professional. Talk to your pediatrician regarding the use of this medicine in children. Special care may be needed. Overdosage: If you think you have taken too much of this medicine contact a poison control center or emergency room at once. NOTE: This medicine is only for you. Do not share this medicine with others. What if I miss a dose? It is important not to miss a dose. Call your  doctor or health care professional if you are unable to keep an appointment. What may interact with this medication? medicines for seizures medicines to increase blood counts like filgrastim, pegfilgrastim, sargramostim some antibiotics like amikacin, gentamicin, neomycin, streptomycin, tobramycin vaccines Talk to your doctor or health care professional before taking any of these medicines: acetaminophen aspirin ibuprofen ketoprofen naproxen This list may not describe all possible interactions. Give your health care provider a list of all the medicines, herbs, non-prescription drugs, or dietary supplements you use. Also tell them if you smoke, drink alcohol, or use illegal drugs.  Some items may interact with your medicine. What should I watch for while using this medication? Your condition will be monitored carefully while you are receiving this medicine. You will need important blood work done while you are taking this medicine. This drug may make you feel generally unwell. This is not uncommon, as chemotherapy can affect healthy cells as well as cancer cells. Report any side effects. Continue your course of treatment even though you feel ill unless your doctor tells you to stop. In some cases, you may be given additional medicines to help with side effects. Follow all directions for their use. Call your doctor or health care professional for advice if you get a fever, chills or sore throat, or other symptoms of a cold or flu. Do not treat yourself. This drug decreases your body's ability to fight infections. Try to avoid being around people who are sick. This medicine may increase your risk to bruise or bleed. Call your doctor or health care professional if you notice any unusual bleeding. Be careful brushing and flossing your teeth or using a toothpick because you may get an infection or bleed more easily. If you have any dental work done, tell your dentist you are receiving this medicine. Avoid taking products that contain aspirin, acetaminophen, ibuprofen, naproxen, or ketoprofen unless instructed by your doctor. These medicines may hide a fever. Do not become pregnant while taking this medicine. Women should inform their doctor if they wish to become pregnant or think they might be pregnant. There is a potential for serious side effects to an unborn child. Talk to your health care professional or pharmacist for more information. Do not breast-feed an infant while taking this medicine. What side effects may I notice from receiving this medication? Side effects that you should report to your doctor or health care professional as soon as possible: allergic reactions like skin  rash, itching or hives, swelling of the face, lips, or tongue signs of infection - fever or chills, cough, sore throat, pain or difficulty passing urine signs of decreased platelets or bleeding - bruising, pinpoint red spots on the skin, black, tarry stools, nosebleeds signs of decreased red blood cells - unusually weak or tired, fainting spells, lightheadedness breathing problems changes in hearing changes in vision chest pain high blood pressure low blood counts - This drug may decrease the number of white blood cells, red blood cells and platelets. You may be at increased risk for infections and bleeding. nausea and vomiting pain, swelling, redness or irritation at the injection site pain, tingling, numbness in the hands or feet problems with balance, talking, walking trouble passing urine or change in the amount of urine Side effects that usually do not require medical attention (report to your doctor or health care professional if they continue or are bothersome): hair loss loss of appetite metallic taste in the mouth or changes in taste This list may not describe all  possible side effects. Call your doctor for medical advice about side effects. You may report side effects to FDA at 1-800-FDA-1088. Where should I keep my medication? This drug is given in a hospital or clinic and will not be stored at home. NOTE: This sheet is a summary. It may not cover all possible information. If you have questions about this medicine, talk to your doctor, pharmacist, or health care provider.  2022 Elsevier/Gold Standard (2007-11-07 00:00:00)

## 2021-07-07 NOTE — Progress Notes (Signed)
Pt tolerated all infusions well with no problems or complaints.  Pt left infusion suite stable and ambulatory with her walker.

## 2021-07-09 ENCOUNTER — Telehealth: Payer: Self-pay | Admitting: *Deleted

## 2021-07-09 NOTE — Telephone Encounter (Signed)
[facility] THERAPIST with [facility] called asking for orders for therapy for 1 wk 1, 2 wk 4, 1 wk 4 also asking for occupational therapy evaluate to assist with bathing. Please advise

## 2021-07-10 ENCOUNTER — Other Ambulatory Visit: Payer: Self-pay | Admitting: Nurse Practitioner

## 2021-07-10 DIAGNOSIS — D490 Neoplasm of unspecified behavior of digestive system: Secondary | ICD-10-CM

## 2021-07-12 ENCOUNTER — Encounter: Payer: Self-pay | Admitting: Internal Medicine

## 2021-07-12 NOTE — Telephone Encounter (Signed)
Verbal order called to [facility] at [facility] per verbal order Dr Grayland Ormond, had to leave message on confidential voice mail

## 2021-07-13 ENCOUNTER — Other Ambulatory Visit: Payer: Self-pay | Admitting: *Deleted

## 2021-07-13 DIAGNOSIS — D490 Neoplasm of unspecified behavior of digestive system: Secondary | ICD-10-CM

## 2021-07-14 ENCOUNTER — Other Ambulatory Visit: Payer: Self-pay

## 2021-07-14 ENCOUNTER — Inpatient Hospital Stay: Payer: Medicare PPO | Attending: Internal Medicine

## 2021-07-14 ENCOUNTER — Inpatient Hospital Stay: Payer: Medicare PPO

## 2021-07-14 DIAGNOSIS — Z79899 Other long term (current) drug therapy: Secondary | ICD-10-CM | POA: Diagnosis not present

## 2021-07-14 DIAGNOSIS — C154 Malignant neoplasm of middle third of esophagus: Secondary | ICD-10-CM | POA: Insufficient documentation

## 2021-07-14 DIAGNOSIS — Z87891 Personal history of nicotine dependence: Secondary | ICD-10-CM | POA: Diagnosis not present

## 2021-07-14 DIAGNOSIS — D490 Neoplasm of unspecified behavior of digestive system: Secondary | ICD-10-CM

## 2021-07-14 DIAGNOSIS — C3411 Malignant neoplasm of upper lobe, right bronchus or lung: Secondary | ICD-10-CM | POA: Diagnosis not present

## 2021-07-14 DIAGNOSIS — D649 Anemia, unspecified: Secondary | ICD-10-CM | POA: Diagnosis not present

## 2021-07-14 LAB — COMPREHENSIVE METABOLIC PANEL
ALT: 22 U/L (ref 0–44)
AST: 24 U/L (ref 15–41)
Albumin: 3.7 g/dL (ref 3.5–5.0)
Alkaline Phosphatase: 58 U/L (ref 38–126)
Anion gap: 10 (ref 5–15)
BUN: 13 mg/dL (ref 8–23)
CO2: 25 mmol/L (ref 22–32)
Calcium: 9.1 mg/dL (ref 8.9–10.3)
Chloride: 99 mmol/L (ref 98–111)
Creatinine, Ser: 0.84 mg/dL (ref 0.44–1.00)
GFR, Estimated: >60 mL/min (ref >60)
Glucose, Bld: 226 mg/dL — ABNORMAL HIGH (ref 70–99)
Potassium: 3.8 mmol/L (ref 3.5–5.1)
Sodium: 134 mmol/L — ABNORMAL LOW (ref 135–145)
Total Bilirubin: 0.4 mg/dL (ref 0.3–1.2)
Total Protein: 6.8 g/dL (ref 6.5–8.1)

## 2021-07-14 LAB — CBC WITH DIFFERENTIAL/PLATELET
Abs Immature Granulocytes: 0.04 10*3/uL (ref 0.00–0.07)
Basophils Absolute: 0 10*3/uL (ref 0.0–0.1)
Basophils Relative: 0 %
Eosinophils Absolute: 0 10*3/uL (ref 0.0–0.5)
Eosinophils Relative: 0 %
HCT: 26.2 % — ABNORMAL LOW (ref 36.0–46.0)
Hemoglobin: 9.1 g/dL — ABNORMAL LOW (ref 12.0–15.0)
Immature Granulocytes: 2 %
Lymphocytes Relative: 21 %
Lymphs Abs: 0.5 10*3/uL — ABNORMAL LOW (ref 0.7–4.0)
MCH: 30.6 pg (ref 26.0–34.0)
MCHC: 34.7 g/dL (ref 30.0–36.0)
MCV: 88.2 fL (ref 80.0–100.0)
Monocytes Absolute: 0.1 10*3/uL (ref 0.1–1.0)
Monocytes Relative: 6 %
Neutro Abs: 1.6 10*3/uL — ABNORMAL LOW (ref 1.7–7.7)
Neutrophils Relative %: 71 %
Platelets: 192 10*3/uL (ref 150–400)
RBC: 2.97 MIL/uL — ABNORMAL LOW (ref 3.87–5.11)
RDW: 14.5 % (ref 11.5–15.5)
WBC: 2.3 10*3/uL — ABNORMAL LOW (ref 4.0–10.5)
nRBC: 1.3 % — ABNORMAL HIGH (ref 0.0–0.2)

## 2021-07-14 NOTE — Progress Notes (Signed)
Pt here for IV fluids. Pt denies nausea, vomiting, diarrhea, weakness, or dizziness. She endorses a good appetite and states she is drinking adequate fluids. Denies excessive fatigue or weakness. Pt declines IV fluids today, as she does not feel they are needed. Labs reviewed and patient discharged as requested.

## 2021-07-15 ENCOUNTER — Inpatient Hospital Stay: Payer: Medicare PPO

## 2021-07-15 ENCOUNTER — Encounter: Payer: Self-pay | Admitting: Internal Medicine

## 2021-07-15 MED ORDER — LORAZEPAM 0.5 MG PO TABS: 0.5000 mg | ORAL_TABLET | Freq: Four times a day (QID) | ORAL | 0 refills | Status: DC | PRN

## 2021-07-21 ENCOUNTER — Inpatient Hospital Stay: Payer: Medicare PPO

## 2021-07-21 ENCOUNTER — Other Ambulatory Visit: Payer: Self-pay

## 2021-07-21 ENCOUNTER — Encounter: Payer: Self-pay | Admitting: Internal Medicine

## 2021-07-21 ENCOUNTER — Inpatient Hospital Stay (HOSPITAL_BASED_OUTPATIENT_CLINIC_OR_DEPARTMENT_OTHER): Payer: Medicare PPO | Admitting: Internal Medicine

## 2021-07-21 VITALS — BP 162/72 | HR 88 | Temp 98.4°F | Ht 63.0 in | Wt 139.0 lb

## 2021-07-21 DIAGNOSIS — Z87891 Personal history of nicotine dependence: Secondary | ICD-10-CM | POA: Diagnosis not present

## 2021-07-21 DIAGNOSIS — D649 Anemia, unspecified: Secondary | ICD-10-CM | POA: Diagnosis not present

## 2021-07-21 DIAGNOSIS — C3411 Malignant neoplasm of upper lobe, right bronchus or lung: Secondary | ICD-10-CM | POA: Diagnosis not present

## 2021-07-21 DIAGNOSIS — C154 Malignant neoplasm of middle third of esophagus: Secondary | ICD-10-CM | POA: Diagnosis not present

## 2021-07-21 DIAGNOSIS — D490 Neoplasm of unspecified behavior of digestive system: Secondary | ICD-10-CM

## 2021-07-21 DIAGNOSIS — Z79899 Other long term (current) drug therapy: Secondary | ICD-10-CM | POA: Diagnosis not present

## 2021-07-21 LAB — CBC WITH DIFFERENTIAL/PLATELET
Abs Immature Granulocytes: 0.02 10*3/uL (ref 0.00–0.07)
Basophils Absolute: 0 10*3/uL (ref 0.0–0.1)
Basophils Relative: 1 %
Eosinophils Absolute: 0 10*3/uL (ref 0.0–0.5)
Eosinophils Relative: 1 %
HCT: 27.4 % — ABNORMAL LOW (ref 36.0–46.0)
Hemoglobin: 9.1 g/dL — ABNORMAL LOW (ref 12.0–15.0)
Immature Granulocytes: 1 %
Lymphocytes Relative: 26 %
Lymphs Abs: 0.8 10*3/uL (ref 0.7–4.0)
MCH: 30.2 pg (ref 26.0–34.0)
MCHC: 33.2 g/dL (ref 30.0–36.0)
MCV: 91 fL (ref 80.0–100.0)
Monocytes Absolute: 0.7 10*3/uL (ref 0.1–1.0)
Monocytes Relative: 23 %
Neutro Abs: 1.6 10*3/uL — ABNORMAL LOW (ref 1.7–7.7)
Neutrophils Relative %: 48 %
Platelets: 185 10*3/uL (ref 150–400)
RBC: 3.01 MIL/uL — ABNORMAL LOW (ref 3.87–5.11)
RDW: 18.3 % — ABNORMAL HIGH (ref 11.5–15.5)
WBC: 3.3 10*3/uL — ABNORMAL LOW (ref 4.0–10.5)
nRBC: 0 % (ref 0.0–0.2)

## 2021-07-21 LAB — COMPREHENSIVE METABOLIC PANEL
ALT: 24 U/L (ref 0–44)
AST: 21 U/L (ref 15–41)
Albumin: 3.7 g/dL (ref 3.5–5.0)
Alkaline Phosphatase: 66 U/L (ref 38–126)
Anion gap: 7 (ref 5–15)
BUN: 9 mg/dL (ref 8–23)
CO2: 27 mmol/L (ref 22–32)
Calcium: 9.4 mg/dL (ref 8.9–10.3)
Chloride: 103 mmol/L (ref 98–111)
Creatinine, Ser: 0.7 mg/dL (ref 0.44–1.00)
GFR, Estimated: >60 mL/min (ref >60)
Glucose, Bld: 193 mg/dL — ABNORMAL HIGH (ref 70–99)
Potassium: 3.8 mmol/L (ref 3.5–5.1)
Sodium: 137 mmol/L (ref 135–145)
Total Bilirubin: 0.6 mg/dL (ref 0.3–1.2)
Total Protein: 6.7 g/dL (ref 6.5–8.1)

## 2021-07-21 NOTE — Progress Notes (Signed)
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  Starting radiation and chemotherapy from April 14, 2014 Patient was started on carboplatinum andTaxol Herve were developed an allergic reaction to Taxol so would be changed to Abraxane 3.patient has finished 6 cycles of weekly chemotherapy with carboplatinum  and radiation therapy(May 28, 2014) 4.started on  NIVOLULAMAB because of persistent disease July 02, 2014. 5.  NIVOLULAMAB was discontinued because of persistent diarrhea in July of 2016.  August of 2016 CT scan was stable so no further chemotherapy  # AUG 25th PET- STABLE RUL MASS [radiation fibrosis; <1cm ? 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  Accompanied by her daughter.  HESSIE VARONE 88 y.o.  female pleasant patient newly  diagnosed squamous cell carcinoma of the midesophagus-locally advanced on definitive [carbo-abraxane]chemoradiation is here for follow-up.  Patient finished chemoradiation therapy approximately 2 weeks ago.  Patient is currently working with physical therapy.  Most improvement of energy levels.  She is more up and about.  No falls.  Patient denies any worsening fatigue or chest pain or shortness of breath or fevers or chills.  She has been drinking of fluids.  Denies any tingling or numbness.  She is concerned about her elevated blood pressures.  Review of Systems  Constitutional:  Positive for malaise/fatigue. Negative for chills, diaphoresis, fever and weight loss.  HENT:  Negative for nosebleeds and sore throat.   Eyes:  Negative for double vision.  Respiratory:  Negative for hemoptysis, sputum production, shortness of breath and wheezing.   Cardiovascular:  Negative for chest pain, palpitations, orthopnea and leg swelling.  Gastrointestinal:  Negative for abdominal pain, blood in stool, constipation, diarrhea, heartburn, melena, nausea and vomiting.  Genitourinary:  Negative for dysuria, frequency and urgency.  Musculoskeletal:  Negative for back pain and joint pain.  Skin: Negative.  Negative for itching and rash.  Neurological:  Negative for dizziness, tingling, focal weakness, weakness and headaches.  Endo/Heme/Allergies:  Does not bruise/bleed easily.  Psychiatric/Behavioral:  Negative for depression. The patient is not nervous/anxious and does not have insomnia.     PAST MEDICAL HISTORY :  Past Medical History:  Diagnosis Date   Chemotherapy induced nausea and vomiting    Chicken pox    Esophageal cancer (HCC)    Hypercalcemia    familial hypocalciuric hypercalcemia   Hypercholesterolemia    Hypertension    Lung cancer (  [place])    Osteoporosis    Thyroid disease     PAST SURGICAL HISTORY :   Past Surgical History:  Procedure Laterality Date   ABDOMINAL HYSTERECTOMY      partial   CHOLECYSTECTOMY     COLONOSCOPY WITH PROPOFOL N/A 04/17/2017   Procedure: COLONOSCOPY WITH PROPOFOL;  Surgeon: Manya Silvas, MD;  Location: [facility] ENDOSCOPY;  Service: Endoscopy;  Laterality: N/A;   ESOPHAGOGASTRODUODENOSCOPY (EGD) WITH PROPOFOL N/A 04/13/2021   Procedure: ESOPHAGOGASTRODUODENOSCOPY (EGD) WITH PROPOFOL;  Surgeon: [name], MD;  Location: [facility] ENDOSCOPY;  Service: Endoscopy;  Laterality: N/A;   EUS N/A 04/22/2021   Procedure: FULL UPPER ENDOSCOPIC ULTRASOUND (EUS) RADIAL;  Surgeon: Jola Schmidt, MD;  Location: [facility] ENDOSCOPY;  Service: Endoscopy;  Laterality: N/A;  [facility] needed   transvaginal hysterectomy  04/18/05   with anterior colporrhaphy    FAMILY HISTORY :   Family History  Problem Relation Age of Onset   Stroke Mother    Hypertension Mother    Prostate cancer Father    Cancer Father        prostate   Heart disease Brother        s/p CABG   Colon cancer Neg Hx     SOCIAL HISTORY:   Social History   Tobacco Use   Smoking status: Former    Types: Cigarettes    Quit date: 06/13/1997    Years since quitting: 24.1   Smokeless tobacco: Never  Vaping Use   Vaping Use: Never used  Substance Use Topics   Alcohol use: No    Alcohol/week: 0.0 standard drinks   Drug use: No    ALLERGIES:  is allergic to paclitaxel.  MEDICATIONS:  Current Outpatient Medications  Medication Sig Dispense Refill   cholecalciferol (VITAMIN D3) 25 MCG (1000 UNIT) tablet Take 1,000 Units by mouth daily.     dexamethasone (DECADRON) 4 MG tablet Take 2 tablets (8 mg total) by mouth daily. Start the day after chemotherapy for 2 days. 30 tablet 1   diphenoxylate-atropine (LOMOTIL) 2.5-0.025 MG tablet Take 1 tablet by mouth 4 (four) times daily as needed for diarrhea or loose stools. Take it along with immodium 30 tablet 0   fluticasone (FLONASE) 50 MCG/ACT nasal spray Place 2 sprays into both nostrils daily. 16 g 3   levocetirizine (XYZAL) 5 MG tablet Take 1 tablet (5  mg total) by mouth every evening. 90 tablet 2   LORazepam (ATIVAN) 0.5 MG tablet Take 1 tablet (0.5 mg total) by mouth every 6 (six) hours as needed (Nausea or vomiting). 30 tablet 0   ondansetron (ZOFRAN) 8 MG tablet Take 1 tablet (8 mg total) by mouth 2 (two) times daily as needed for refractory nausea / vomiting. Start on day 3 after chemo. 30 tablet 1   pantoprazole (PROTONIX) 40 MG tablet TAKE 1 TABLET BY MOUTH ONCE DAILY 90 tablet 3   prochlorperazine (COMPAZINE) 10 MG tablet Take 1 tablet (10 mg total) by mouth every 6 (six) hours as needed (Nausea or vomiting). 30 tablet 1   Ascorbic Acid (VITAMIN C) 1000 MG tablet Take 1,000 mg by mouth daily. (Patient not taking: Reported on 06/23/2021)     olmesartan-hydrochlorothiazide (BENICAR HCT) 20-12.5 MG tablet TAKE 1 TABLET BY MOUTH ONCE DAILY (Patient not taking: Reported on 07/07/2021) 90 tablet 1   No current facility-administered medications for this visit.   Facility-Administered Medications Ordered in Other Visits  Medication Dose Route Frequency Provider Last Rate Last Admin   sodium chloride  0.9 % 1,000 mL with potassium chloride 20 mEq, magnesium sulfate 2 g infusion   Intravenous Continuous Forest Gleason, MD   Stopped at 12/04/14 1300    PHYSICAL EXAMINATION: ECOG PERFORMANCE STATUS: 0 - Asymptomatic  BP (!) 162/72 (BP Location: Left Arm, Patient Position: Sitting, Cuff Size: Normal)    Pulse 88    Temp 98.4 F (36.9 C) (Tympanic)    Ht 5' 3"  (1.6 m)    Wt 139 lb (63 kg)    SpO2 100%    BMI 24.62 kg/m   Filed Weights   07/21/21 0830  Weight: 139 lb (63 kg)    Physical Exam HENT:     Head: Normocephalic and atraumatic.     Mouth/Throat:     Pharynx: No oropharyngeal exudate.  Eyes:     Pupils: Pupils are equal, round, and reactive to light.  Cardiovascular:     Rate and Rhythm: Normal rate and regular rhythm.  Pulmonary:     Effort: Pulmonary effort is normal. No respiratory distress.     Breath sounds: Normal breath  sounds. No wheezing.  Abdominal:     General: Bowel sounds are normal. There is no distension.     Palpations: Abdomen is soft. There is no mass.     Tenderness: There is no abdominal tenderness. There is no guarding or rebound.  Musculoskeletal:        General: No tenderness. Normal range of motion.     Cervical back: Normal range of motion and neck supple.  Skin:    General: Skin is warm.  Neurological:     Mental Status: She is alert and oriented to person, place, and time.  Psychiatric:        Mood and Affect: Affect normal.     LABORATORY DATA:  I have reviewed the data as listed    Component Value Date/Time   NA 137 07/21/2021 0753   NA 130 (L) 10/09/2014 0846   K 3.8 07/21/2021 0753   K 4.1 10/09/2014 0846   CL 103 07/21/2021 0753   CL 98 (L) 10/09/2014 0846   CO2 27 07/21/2021 0753   CO2 25 10/09/2014 0846   GLUCOSE 193 (H) 07/21/2021 0753   GLUCOSE 161 (H) 10/09/2014 0846   BUN 9 07/21/2021 0753   BUN 14 10/09/2014 0846   CREATININE 0.70 07/21/2021 0753   CREATININE 0.70 10/09/2014 0846   CALCIUM 9.4 07/21/2021 0753   CALCIUM 9.1 10/09/2014 0846   PROT 6.7 07/21/2021 0753   PROT 7.3 10/09/2014 0846   ALBUMIN 3.7 07/21/2021 0753   ALBUMIN 3.6 10/09/2014 0846   AST 21 07/21/2021 0753   AST 16 10/09/2014 0846   ALT 24 07/21/2021 0753   ALT 13 (L) 10/09/2014 0846   ALKPHOS 66 07/21/2021 0753   ALKPHOS 70 10/09/2014 0846   BILITOT 0.6 07/21/2021 0753   BILITOT 1.0 10/09/2014 0846   GFRNONAA >60 07/21/2021 0753   GFRNONAA >60 10/09/2014 0846   GFRAA >60 02/05/2020 1004   GFRAA >60 10/09/2014 0846    No results found for: SPEP, UPEP  Lab Results  Component Value Date   WBC 3.3 (L) 07/21/2021   NEUTROABS 1.6 (L) 07/21/2021   HGB 9.1 (L) 07/21/2021   HCT 27.4 (L) 07/21/2021   MCV 91.0 07/21/2021   PLT 185 07/21/2021      Chemistry      Component Value Date/Time   NA 137 07/21/2021 0753   NA 130 (L) 10/09/2014 0846   K 3.8  07/21/2021 0753   K  4.1 10/09/2014 0846   CL 103 07/21/2021 0753   CL 98 (L) 10/09/2014 0846   CO2 27 07/21/2021 0753   CO2 25 10/09/2014 0846   BUN 9 07/21/2021 0753   BUN 14 10/09/2014 0846   CREATININE 0.70 07/21/2021 0753   CREATININE 0.70 10/09/2014 0846   GLU 111 03/18/2014 1048      Component Value Date/Time   CALCIUM 9.4 07/21/2021 0753   CALCIUM 9.1 10/09/2014 0846   ALKPHOS 66 07/21/2021 0753   ALKPHOS 70 10/09/2014 0846   AST 21 07/21/2021 0753   AST 16 10/09/2014 0846   ALT 24 07/21/2021 0753   ALT 13 (L) 10/09/2014 0846   BILITOT 0.6 07/21/2021 0753   BILITOT 1.0 10/09/2014 0846       RADIOGRAPHIC STUDIES: I have personally reviewed the radiological images as listed and agreed with the findings in the report. No results found.    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  Currently s/p  concurrent chemoradiation-for locally advanced squamous cell cancer of the esophagus [plan a total of 6- RT-chemo-Jan 25 th, 2023].   # Discussed that we will plan to get repeat imaging in 6 to 8 weeks posttreatment-first week of April; follow-up thereafter.  # DEC 2022- ARF- 1.4- improved;  today- creatinine- 0.8; with elevated BP- 160s-re-start BP medications-benicar-HCTZ.   # Mild  Anemia- improved- Hb 9-10- sec to chemo-STABLE.  # FBG- 194- recommend cutting down carbs- monitor for now. [glucerna protein shakes]  #Debility/risk of falls-discussed with Gwenette Greet; on PT-physical therapy- STABLE.   # DISPOSITION:  # NO IVFs today # cancel in feb 24th appts with me/labs # Follow up in last week of March 2023-  MD; labs- cbc/cmp; PET few days prior- ;-Dr.B  Cc; Dr.Scott    Orders Placed This Encounter  Procedures   NM PET Image Restage (PS) Skull Base to Thigh (F-18 FDG)    Standing Status:   Future    Standing Expiration Date:   07/21/2022     Order Specific Question:   If indicated for the ordered procedure, I authorize the administration of a radiopharmaceutical per Radiology protocol    Answer:   Yes    Order Specific Question:   Preferred imaging location?    Answer:   [facility]    All questions were answered. The patient knows to call the clinic with any problems, questions or concerns.      Cammie Sickle, MD 07/21/2021 1:20 PM

## 2021-07-21 NOTE — Progress Notes (Signed)
Per Dr. Rogue Bussing, no IV fluids today.

## 2021-07-21 NOTE — Assessment & Plan Note (Addendum)
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  Currently s/p  concurrent chemoradiation-for locally advanced squamous cell cancer of the esophagus [plan a total of 6- RT-chemo-Jan 25 th, 2023].   # Discussed that we will plan to get repeat imaging in 6 to 8 weeks posttreatment-first week of April; follow-up thereafter.  # DEC 2022- ARF- 1.4- improved;  today- creatinine- 0.8; with elevated BP- 160s-re-start BP medications-benicar-HCTZ.   # Mild  Anemia- improved- Hb 9-10- sec to chemo-STABLE.  # FBG- 194- recommend cutting down carbs- monitor for now. [glucerna protein shakes]  #Debility/risk of falls-discussed with Gwenette Greet; on PT-physical therapy- STABLE.   # DISPOSITION:  # NO IVFs today # cancel in feb 24th appts with me/labs # Follow up in last week of March 2023-  MD; labs- cbc/cmp; PET few days prior- ;-Dr.B  Cc; Dr.Scott

## 2021-07-21 NOTE — Progress Notes (Signed)
Pt cut right thumb, would like you to check to make sure it's not infected.  She would like to know if she should restart her bp med, benicar? BP has been up for past 2 days.

## 2021-07-21 NOTE — Progress Notes (Signed)
Nutrition Follow-up:  Patient with esophageal cancer.  Patient has completed radiation and chemotherapy.    RD planning to see patient during fluids but fluids not needed today. MD requested that RD call patient later this am.  Called and spoke with patient via phone.  Patient says that her appetite is good and she is eating 3 meals per day.  Drinking ensure complete, water and gatorade.  Denies difficulty swallowing.    Medications: reviewed  Labs: glucose 193  Anthropometrics:   Weight 139 lb today  142 lb on 1/25 143 lb on 1/18 145 lb 1.6 oz on 1/5 146 lb on 12/20 149 lb on 12/12 152 lb on 8/26   NUTRITION DIAGNOSIS: Inadequate oral intake stable   INTERVENTION:  Discussed ways to add calories to food without increasing sugar. Discussed low sugar shakes (although lower calories and wt trending down is concerning), low sugar gatorade, etc with patient and daughter.      MONITORING, EVALUATION, GOAL: weight trends, intake   NEXT VISIT: Wednesday, March 8 phone call  Heather Cooper B. Zenia Resides, [place], [place] Registered Dietitian [phone] (mobile)

## 2021-07-22 ENCOUNTER — Encounter: Payer: Self-pay | Admitting: Internal Medicine

## 2021-07-29 ENCOUNTER — Ambulatory Visit (INDEPENDENT_AMBULATORY_CARE_PROVIDER_SITE_OTHER): Payer: Medicare PPO | Admitting: Vascular Surgery

## 2021-07-29 ENCOUNTER — Encounter (INDEPENDENT_AMBULATORY_CARE_PROVIDER_SITE_OTHER): Payer: Medicare PPO

## 2021-08-05 ENCOUNTER — Encounter (INDEPENDENT_AMBULATORY_CARE_PROVIDER_SITE_OTHER): Payer: Medicare PPO | Admitting: Ophthalmology

## 2021-08-06 ENCOUNTER — Other Ambulatory Visit: Payer: Medicare PPO

## 2021-08-06 ENCOUNTER — Other Ambulatory Visit: Payer: Self-pay

## 2021-08-06 ENCOUNTER — Ambulatory Visit: Payer: Medicare PPO | Admitting: Internal Medicine

## 2021-08-06 ENCOUNTER — Ambulatory Visit
Admission: RE | Admit: 2021-08-06 | Discharge: 2021-08-06 | Disposition: A | Discharge status: Home / Self Care | Payer: Medicare PPO | Source: Ambulatory Visit | Attending: Radiation Oncology | Admitting: Radiation Oncology

## 2021-08-06 VITALS — BP 147/68 | HR 106 | Temp 97.5°F | Ht 63.0 in | Wt 138.6 lb

## 2021-08-06 DIAGNOSIS — C154 Malignant neoplasm of middle third of esophagus: Secondary | ICD-10-CM | POA: Diagnosis not present

## 2021-08-06 DIAGNOSIS — Z923 Personal history of irradiation: Secondary | ICD-10-CM | POA: Diagnosis not present

## 2021-08-06 NOTE — Progress Notes (Signed)
Radiation Oncology Follow up Note  Name: Heather Cooper   Date:   08/06/2021 MRN:  [id number] DOB: [birth year]    This 88 y.o. female presents to the clinic today for 1 month follow-up status post external beam radiation therapy for stage II (T2 N1 M0) moderately differentiated squamous cell carcinoma of the midesophagus.  Patient been previously treated for stage IV adenocarcinoma right lung back in 2015.[name]  REFERRING PROVIDER: [name], MD  HPI: Patient is an 88 year old female now at 1 month having completed radiation therapy for stage II moderately differentiated squamous cell carcinoma of the midesophagus.  Seen today in routine follow-up she is doing well she states her swallowing is stable she is still cuts up her foods quite small and drinks lots of fluid while she eats.  She is having no other overt symptoms or pain..  She has a PET scan scheduled for April.  COMPLICATIONS OF TREATMENT: none  FOLLOW UP COMPLIANCE: keeps appointments   PHYSICAL EXAM:  BP (!) 147/68    Pulse (!) 106    Temp (!) 97.5 F (36.4 C)    Ht 5\' 3"  (1.6 m)    Wt 138 lb 9.6 oz (62.9 kg)    BMI 24.55 kg/m  Well-developed well-nourished patient in NAD. HEENT reveals PERLA, EOMI, discs not visualized.  Oral cavity is clear. No oral mucosal lesions are identified. Neck is clear without evidence of cervical or supraclavicular adenopathy. Lungs are clear to A&P. Cardiac examination is essentially unremarkable with regular rate and rhythm without murmur rub or thrill. Abdomen is benign with no organomegaly or masses noted. Motor sensory and DTR levels are equal and symmetric in the upper and lower extremities. Cranial nerves II through XII are grossly intact. Proprioception is intact. No peripheral adenopathy or edema is identified. No motor or sensory levels are noted. Crude visual fields are within normal range.  RADIOLOGY RESULTS: PET scan will be reviewed when available  PLAN: Present time patient is doing  well very low side effect profile from her recent radiation therapy for mid esophageal squamous cell carcinoma.  And pleased with her overall progress.  Depending on PET results she may benefit from repeat upper endoscopy.  She also may may need some dilatation of her esophagus at some point.  I have asked to see her back in 4 to 5 months for follow-up.  Patient and daughter both know to call with any concerns.  She continues close follow-up care with medical oncology.  I would like to take this opportunity to thank you for allowing me to participate in the care of your patient.Noreene Filbert, MD

## 2021-08-09 ENCOUNTER — Ambulatory Visit: Payer: Medicare PPO | Admitting: Radiation Oncology

## 2021-08-18 ENCOUNTER — Inpatient Hospital Stay: Payer: Medicare PPO | Attending: Internal Medicine

## 2021-08-18 ENCOUNTER — Other Ambulatory Visit: Payer: Self-pay

## 2021-08-18 NOTE — Progress Notes (Signed)
Nutrition Follow-up: ? ?Patient with esophageal cancer.  Patient has completed radiation and chemotherapy.  ? ?Spoke with patient via phone.  Patient reports that her appetite is coming back to her.  Ate oatmeal for breakfast this morning but sometimes will eat an egg and bacon.  Had a TV dinner of fish, macaroni and cheese and cooked pinto beans.  Last night ate spinach, pinto beans and [place] wings. Likes applesauce and yogurt.  Drinking fluids and ensure shakes.  Continues to work with PT and using mostly cane vs walker now.  Feeling stronger in legs.   ? ?Denies any nutrition impact symptoms.  Does sometimes feel pills going down.   ? ? ?Medications: reviewed ? ?Labs: reviewed ? ?Anthropometrics:  ? ?Weight 138 lb 9.6 oz on 2/24 ?139 lb on 2/8 ?142 lb on 1/25 ?143 lb on 1/18 ?145 lb 1.6 oz on 1/5 ?146 lb on 12/20 ?149 lb on 12/12 ? ? ?NUTRITION DIAGNOSIS: Inadequate oral intake improved ? ? ?INTERVENTION:  ?Encouraged patient to continue with good nutrition and exercise to improve strength. ?Continue oral nutrition supplements ?Contact information provided to patient and encouraged patient to call RD if needed ?  ? ? ?NEXT VISIT: no follow-up scheduled ?RD available as needed ? ?Dmya Long B. Zenia Resides, RD, LDN ?Registered Dietitian ?[phone] (mobile) ? ? ?

## 2021-08-26 ENCOUNTER — Ambulatory Visit (INDEPENDENT_AMBULATORY_CARE_PROVIDER_SITE_OTHER): Payer: Medicare PPO | Admitting: Internal Medicine

## 2021-08-26 ENCOUNTER — Other Ambulatory Visit: Payer: Self-pay

## 2021-08-26 VITALS — BP 114/62 | HR 96 | Temp 97.9°F | Resp 16 | Ht 63.0 in | Wt 136.0 lb

## 2021-08-26 DIAGNOSIS — I700 Atherosclerosis of aorta: Secondary | ICD-10-CM | POA: Diagnosis not present

## 2021-08-26 DIAGNOSIS — E1165 Type 2 diabetes mellitus with hyperglycemia: Secondary | ICD-10-CM

## 2021-08-26 DIAGNOSIS — R1319 Other dysphagia: Secondary | ICD-10-CM | POA: Diagnosis not present

## 2021-08-26 DIAGNOSIS — J439 Emphysema, unspecified: Secondary | ICD-10-CM

## 2021-08-26 DIAGNOSIS — C159 Malignant neoplasm of esophagus, unspecified: Secondary | ICD-10-CM

## 2021-08-26 DIAGNOSIS — D649 Anemia, unspecified: Secondary | ICD-10-CM | POA: Diagnosis not present

## 2021-08-26 DIAGNOSIS — I10 Essential (primary) hypertension: Secondary | ICD-10-CM

## 2021-08-26 DIAGNOSIS — E7800 Pure hypercholesterolemia, unspecified: Secondary | ICD-10-CM

## 2021-08-26 DIAGNOSIS — K219 Gastro-esophageal reflux disease without esophagitis: Secondary | ICD-10-CM

## 2021-08-26 DIAGNOSIS — I714 Abdominal aortic aneurysm, without rupture, unspecified: Secondary | ICD-10-CM | POA: Diagnosis not present

## 2021-08-26 LAB — TSH: TSH: 0.23 u[IU]/mL — ABNORMAL LOW (ref 0.35–5.50)

## 2021-08-26 LAB — BASIC METABOLIC PANEL
BUN: 13 mg/dL (ref 6–23)
CO2: 29 mEq/L (ref 19–32)
Calcium: 10 mg/dL (ref 8.4–10.5)
Chloride: 100 mEq/L (ref 96–112)
Creatinine, Ser: 0.9 mg/dL (ref 0.40–1.20)
GFR: 57.02 mL/min — ABNORMAL LOW (ref >60.00)
Glucose, Bld: 101 mg/dL — ABNORMAL HIGH (ref 70–99)
Potassium: 3.9 mEq/L (ref 3.5–5.1)
Sodium: 137 mEq/L (ref 135–145)

## 2021-08-26 LAB — LIPID PANEL
Cholesterol: 171 mg/dL (ref 0–200)
HDL: 39.1 mg/dL (ref >39.00)
LDL Cholesterol: 96 mg/dL (ref 0–99)
NonHDL: 132.02
Total CHOL/HDL Ratio: 4
Triglycerides: 182 mg/dL — ABNORMAL HIGH (ref 0.0–149.0)
VLDL: 36.4 mg/dL (ref 0.0–40.0)

## 2021-08-26 LAB — HEMOGLOBIN A1C: Hgb A1c MFr Bld: 5.5 % (ref 4.6–6.5)

## 2021-08-26 NOTE — Progress Notes (Signed)
Patient ID: Heather Cooper, female   DOB: 03/21/1931, 88 y.o.   MRN: [id number] ? ? ?Subjective:  ? ? Patient ID: Heather Cooper, female    DOB: 23-Nov-1930, 88 y.o.   MRN: [id number] ? ?This visit occurred during the SARS-CoV-2 public health emergency.  Safety protocols were in place, including screening questions prior to the visit, additional usage of staff PPE, and extensive cleaning of exam room while observing appropriate contact time as indicated for disinfecting solutions.  ? ?Patient here for follow up appt .  ? ?HPI ?Follow up regarding her blood pressure, cholesterol and esophageal cancer.  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  She is eating well.  Good appetite.  Occasionally will notice swallowing issues.  Discussed eating slowly, taking small bites and chewing food well.  No nausea or vomiting.  No acid reflux.  No abdominal pain.  Bowels moving.  Overall she feels she is doing well.  Scheduled for PET 09/15/21.   ? ? ?Past Medical History:  ?Diagnosis Date  ? Chemotherapy induced nausea and vomiting   ? Chicken pox   ? Esophageal cancer ([place])   ? Hypercalcemia   ? familial hypocalciuric hypercalcemia  ? Hypercholesterolemia   ? Hypertension   ? Lung cancer ([place])   ? Osteoporosis   ? Thyroid disease   ? ?Past Surgical History:  ?Procedure Laterality Date  ? ABDOMINAL HYSTERECTOMY    ? partial  ? CHOLECYSTECTOMY    ? COLONOSCOPY WITH PROPOFOL N/A 04/17/2017  ? Procedure: COLONOSCOPY WITH PROPOFOL;  Surgeon: Manya Silvas, MD;  Location[facility] ENDOSCOPY;  Service: Endoscopy;  Laterality: N/A;  ? ESOPHAGOGASTRODUODENOSCOPY (EGD) WITH PROPOFOL N/A 04/13/2021  ? Procedure: ESOPHAGOGASTRODUODENOSCOPY (EGD) WITH PROPOFOL;  Surgeon: [name], MD;  Location: [facility] ENDOSCOPY;  Service: Endoscopy;  Laterality: N/A;  ? EUS N/A 04/22/2021  ? Procedure: FULL UPPER ENDOSCOPIC ULTRASOUND (EUS) RADIAL;  Surgeon: Jola Schmidt, MD;  Location:  [facility] ENDOSCOPY;  Service: Endoscopy;  Laterality: N/A;  [facility] needed  ? transvaginal hysterectomy  04/18/05  ? with anterior colporrhaphy  ? ?Family History  ?Problem Relation Age of Onset  ? Stroke Mother   ? Hypertension Mother   ? Prostate cancer Father   ? Cancer Father   ?     prostate  ? Heart disease Brother   ?     s/p CABG  ? Colon cancer Neg Hx   ? ?Social History  ? ?Socioeconomic History  ? Marital status: Widowed  ?  Spouse name: Not on file  ? Number of children: 3  ? Years of education: Not on file  ? Highest education level: Not on file  ?Occupational History  ? Not on file  ?Tobacco Use  ? Smoking status: Former  ?  Types: Cigarettes  ?  Quit date: 06/13/1997  ?  Years since quitting: 24.2  ? Smokeless tobacco: Never  ?Vaping Use  ? Vaping Use: Never used  ?Substance and Sexual Activity  ? Alcohol use: No  ?  Alcohol/week: 0.0 standard drinks  ? Drug use: No  ? Sexual activity: Not Currently  ?Other Topics Concern  ? Not on file  ?Social History Narrative  ? Lost her husband November 2019  ? ?Social Determinants of Health  ? ?Financial Resource Strain: Low Risk   ? Difficulty of Paying Living Expenses: Not hard at all  ?Food Insecurity: No Food Insecurity  ? Worried About Running  Out of Food in the Last Year: Never true  ? Ran Out of Food in the Last Year: Never true  ?Transportation Needs: No Transportation Needs  ? Lack of Transportation (Medical): No  ? Lack of Transportation (Non-Medical): No  ?Physical Activity: Unknown  ? Days of Exercise per Week: 0 days  ? Minutes of Exercise per Session: Not on file  ?Stress: No Stress Concern Present  ? Feeling of Stress : Not at all  ?Social Connections: Moderately Integrated  ? Frequency of Communication with Friends and Family: More than three times a week  ? Frequency of Social Gatherings with Friends and Family: More than three times a week  ? Attends Religious Services: More than 4 times per year  ? Active Member of Clubs or Organizations: Yes  ?  Attends Archivist Meetings: More than 4 times per year  ? Marital Status: Widowed  ? ? ? ?Review of Systems  ?Constitutional:  Negative for appetite change and unexpected weight change.  ?HENT:  Negative for congestion and sinus pressure.   ?Respiratory:  Negative for cough, chest tightness and shortness of breath.   ?Cardiovascular:  Negative for chest pain, palpitations and leg swelling.  ?Gastrointestinal:  Negative for abdominal pain, diarrhea, nausea and vomiting.  ?Genitourinary:  Negative for difficulty urinating and dysuria.  ?Musculoskeletal:  Negative for joint swelling and myalgias.  ?Skin:  Negative for color change and rash.  ?Neurological:  Negative for dizziness, light-headedness and headaches.  ?Psychiatric/Behavioral:  Negative for agitation and dysphoric mood.   ? ?   ?Objective:  ?  ? ?BP 114/62   Pulse 96   Temp 97.9 ?F (36.6 ?C)   Resp 16   Ht 5\' 3"  (1.6 m)   Wt 136 lb (61.7 kg)   SpO2 99%   BMI 24.09 kg/m?  ?Wt Readings from Last 3 Encounters:  ?08/26/21 136 lb (61.7 kg)  ?08/06/21 138 lb 9.6 oz (62.9 kg)  ?07/21/21 139 lb (63 kg)  ? ? ?Physical Exam ?Vitals reviewed.  ?Constitutional:   ?   General: She is not in acute distress. ?   Appearance: Normal appearance.  ?HENT:  ?   Head: Normocephalic and atraumatic.  ?   Right Ear: External ear normal.  ?   Left Ear: External ear normal.  ?Eyes:  ?   General: No scleral icterus.    ?   Right eye: No discharge.     ?   Left eye: No discharge.  ?   Conjunctiva/sclera: Conjunctivae normal.  ?Neck:  ?   Thyroid: No thyromegaly.  ?Cardiovascular:  ?   Rate and Rhythm: Normal rate and regular rhythm.  ?Pulmonary:  ?   Effort: No respiratory distress.  ?   Breath sounds: Normal breath sounds. No wheezing.  ?Abdominal:  ?   General: Bowel sounds are normal.  ?   Palpations: Abdomen is soft.  ?   Tenderness: There is no abdominal tenderness.  ?Musculoskeletal:     ?   General: No swelling or tenderness.  ?   Cervical back: Neck supple. No  tenderness.  ?Lymphadenopathy:  ?   Cervical: No cervical adenopathy.  ?Skin: ?   Findings: No erythema or rash.  ?Neurological:  ?   Mental Status: She is alert.  ?Psychiatric:     ?   Mood and Affect: Mood normal.     ?   Behavior: Behavior normal.  ? ? ? ?Outpatient Encounter Medications as of 08/26/2021  ?Medication Sig  ?  cholecalciferol (VITAMIN D3) 25 MCG (1000 UNIT) tablet Take 1,000 Units by mouth daily.  ? dexamethasone (DECADRON) 4 MG tablet Take 2 tablets (8 mg total) by mouth daily. Start the day after chemotherapy for 2 days.  ? diphenoxylate-atropine (LOMOTIL) 2.5-0.025 MG tablet Take 1 tablet by mouth 4 (four) times daily as needed for diarrhea or loose stools. Take it along with immodium  ? fluticasone (FLONASE) 50 MCG/ACT nasal spray Place 2 sprays into both nostrils daily.  ? levocetirizine (XYZAL) 5 MG tablet Take 1 tablet (5 mg total) by mouth every evening.  ? LORazepam (ATIVAN) 0.5 MG tablet Take 1 tablet (0.5 mg total) by mouth every 6 (six) hours as needed (Nausea or vomiting).  ? olmesartan-hydrochlorothiazide (BENICAR HCT) 20-12.5 MG tablet TAKE 1 TABLET BY MOUTH ONCE DAILY  ? ondansetron (ZOFRAN) 8 MG tablet Take 1 tablet (8 mg total) by mouth 2 (two) times daily as needed for refractory nausea / vomiting. Start on day 3 after chemo.  ? pantoprazole (PROTONIX) 40 MG tablet TAKE 1 TABLET BY MOUTH ONCE DAILY  ? prochlorperazine (COMPAZINE) 10 MG tablet Take 1 tablet (10 mg total) by mouth every 6 (six) hours as needed (Nausea or vomiting).  ? [DISCONTINUED] Ascorbic Acid (VITAMIN C) 1000 MG tablet Take 1,000 mg by mouth daily. (Patient not taking: Reported on 06/23/2021)  ? ?Facility-Administered Encounter Medications as of 08/26/2021  ?Medication  ? sodium chloride 0.9 % 1,000 mL with potassium chloride 20 mEq, magnesium sulfate 2 g infusion  ?  ? ?Lab Results  ?Component Value Date  ? WBC 3.3 (L) 07/21/2021  ? HGB 9.1 (L) 07/21/2021  ? HCT 27.4 (L) 07/21/2021  ? PLT 185 07/21/2021  ? GLUCOSE  101 (H) 08/26/2021  ? CHOL 171 08/26/2021  ? TRIG 182.0 (H) 08/26/2021  ? HDL 39.10 08/26/2021  ? [place] 96 08/26/2021  ? ALT 24 07/21/2021  ? AST 21 07/21/2021  ? NA 137 08/26/2021  ? K 3.9 08/26/2021  ? CL 10

## 2021-08-27 ENCOUNTER — Other Ambulatory Visit: Payer: Self-pay | Admitting: Internal Medicine

## 2021-08-27 ENCOUNTER — Encounter: Payer: Self-pay | Admitting: Internal Medicine

## 2021-08-27 ENCOUNTER — Other Ambulatory Visit (INDEPENDENT_AMBULATORY_CARE_PROVIDER_SITE_OTHER): Payer: Medicare PPO

## 2021-08-27 ENCOUNTER — Telehealth: Payer: Self-pay | Admitting: *Deleted

## 2021-08-27 DIAGNOSIS — R7989 Other specified abnormal findings of blood chemistry: Secondary | ICD-10-CM

## 2021-08-27 LAB — T4, FREE: Free T4: 0.96 ng/dL (ref 0.60–1.60)

## 2021-08-27 LAB — T3, FREE: T3, Free: 3.6 pg/mL (ref 2.3–4.2)

## 2021-08-27 NOTE — Telephone Encounter (Signed)
Received daughter's fmla form on 08/26/21. Daughter needs dates extended for fmla to the end of June.  ? ?08/27/21- Form completed and signed by Dr. Rogue Bussing. Form faxed to [facility] LOA dept. Mychart msg sent to daughter to update her on completion of form. ?

## 2021-08-27 NOTE — Progress Notes (Signed)
Order placed for add on lab.  ?

## 2021-08-28 DIAGNOSIS — I739 Peripheral vascular disease, unspecified: Secondary | ICD-10-CM | POA: Insufficient documentation

## 2021-08-28 DIAGNOSIS — J439 Emphysema, unspecified: Secondary | ICD-10-CM | POA: Insufficient documentation

## 2021-08-28 DIAGNOSIS — I700 Atherosclerosis of aorta: Secondary | ICD-10-CM | POA: Insufficient documentation

## 2021-08-28 NOTE — Assessment & Plan Note (Signed)
Breathing stable.  Follow.    

## 2021-08-28 NOTE — Assessment & Plan Note (Signed)
Continue small bites and chewing food well.  Follow.   ?

## 2021-08-28 NOTE — Assessment & Plan Note (Signed)
Continues on protonix.  No acid reflux reported.  ?

## 2021-08-28 NOTE — Assessment & Plan Note (Signed)
Followed by Dr Delana Meyer (08/06/20)  Stable.  Recommended f/u in 12 months.  Discussed.  She has been in contact with [facility].  Will hold until current treatments complete and then plans f/u.  ?

## 2021-08-28 NOTE — Assessment & Plan Note (Signed)
Being followed by radiation oncology and oncology.  Eating.  Receiving chemo and XRT.  Follow  ?

## 2021-08-28 NOTE — Assessment & Plan Note (Signed)
Not on cholesterol medication.  Continue blood pressure control.  ?

## 2021-08-28 NOTE — Assessment & Plan Note (Signed)
Given elevated sugar, recommend continuing low carb diet and exercise.  Hold on medication.  Follow met b and a1c.  ?

## 2021-08-28 NOTE — Assessment & Plan Note (Signed)
Follow cbc.  Being followed by hematology.  ?

## 2021-08-28 NOTE — Assessment & Plan Note (Signed)
Continue benicar/hctz.  Blood pressure has been doing well.   Follow pressures.  Follow metabolic panel.  ?

## 2021-08-28 NOTE — Assessment & Plan Note (Signed)
Low cholesterol diet and exercise.  Follow lipid panel.   

## 2021-08-31 ENCOUNTER — Other Ambulatory Visit: Payer: Self-pay

## 2021-08-31 DIAGNOSIS — R7989 Other specified abnormal findings of blood chemistry: Secondary | ICD-10-CM

## 2021-09-07 ENCOUNTER — Telehealth: Payer: Self-pay

## 2021-09-07 NOTE — Telephone Encounter (Signed)
PET scan capability will not be available on 09/15/21 as scheduled.  PET has been rescheduled at the [facility] location for 4/17 as well as MD f/u rescheduled to 4/19. ? ?Left message for patient's daughter notifying her of the change and [facility] will be mailed.  ? ?

## 2021-09-23 ENCOUNTER — Other Ambulatory Visit: Payer: Medicare PPO

## 2021-09-23 ENCOUNTER — Ambulatory Visit: Payer: Medicare PPO | Admitting: Internal Medicine

## 2021-09-27 ENCOUNTER — Ambulatory Visit (HOSPITAL_COMMUNITY)
Admission: RE | Admit: 2021-09-27 | Discharge: 2021-09-27 | Disposition: A | Discharge status: Home / Self Care | Payer: Medicare PPO | Source: Ambulatory Visit | Attending: Internal Medicine | Admitting: Internal Medicine

## 2021-09-27 DIAGNOSIS — D490 Neoplasm of unspecified behavior of digestive system: Secondary | ICD-10-CM | POA: Diagnosis present

## 2021-09-27 DIAGNOSIS — I7143 Infrarenal abdominal aortic aneurysm, without rupture: Secondary | ICD-10-CM | POA: Insufficient documentation

## 2021-09-27 DIAGNOSIS — C154 Malignant neoplasm of middle third of esophagus: Secondary | ICD-10-CM | POA: Diagnosis not present

## 2021-09-27 DIAGNOSIS — Z923 Personal history of irradiation: Secondary | ICD-10-CM | POA: Diagnosis not present

## 2021-09-27 DIAGNOSIS — J479 Bronchiectasis, uncomplicated: Secondary | ICD-10-CM | POA: Diagnosis not present

## 2021-09-27 DIAGNOSIS — Z9221 Personal history of antineoplastic chemotherapy: Secondary | ICD-10-CM | POA: Diagnosis not present

## 2021-09-27 LAB — GLUCOSE, CAPILLARY: Glucose-Capillary: 119 mg/dL — ABNORMAL HIGH (ref 70–99)

## 2021-09-27 MED ORDER — FLUDEOXYGLUCOSE F - 18 (FDG) INJECTION
6.8000 | Freq: Once | INTRAVENOUS | Status: AC
  Administered 2021-09-27: 6.71 via INTRAVENOUS

## 2021-09-29 ENCOUNTER — Inpatient Hospital Stay: Payer: Medicare PPO

## 2021-09-29 ENCOUNTER — Encounter: Payer: Self-pay | Admitting: Internal Medicine

## 2021-09-29 ENCOUNTER — Inpatient Hospital Stay: Payer: Medicare PPO | Attending: Internal Medicine | Admitting: Internal Medicine

## 2021-09-29 DIAGNOSIS — C154 Malignant neoplasm of middle third of esophagus: Secondary | ICD-10-CM | POA: Insufficient documentation

## 2021-09-29 DIAGNOSIS — D490 Neoplasm of unspecified behavior of digestive system: Secondary | ICD-10-CM | POA: Diagnosis not present

## 2021-09-29 DIAGNOSIS — Z87891 Personal history of nicotine dependence: Secondary | ICD-10-CM | POA: Diagnosis not present

## 2021-09-29 DIAGNOSIS — E079 Disorder of thyroid, unspecified: Secondary | ICD-10-CM | POA: Insufficient documentation

## 2021-09-29 DIAGNOSIS — D6481 Anemia due to antineoplastic chemotherapy: Secondary | ICD-10-CM | POA: Diagnosis not present

## 2021-09-29 DIAGNOSIS — C3411 Malignant neoplasm of upper lobe, right bronchus or lung: Secondary | ICD-10-CM | POA: Diagnosis not present

## 2021-09-29 LAB — CBC WITH DIFFERENTIAL/PLATELET
Abs Immature Granulocytes: 0.04 10*3/uL (ref 0.00–0.07)
Basophils Absolute: 0 10*3/uL (ref 0.0–0.1)
Basophils Relative: 0 %
Eosinophils Absolute: 0.1 10*3/uL (ref 0.0–0.5)
Eosinophils Relative: 0 %
HCT: 36 % (ref 36.0–46.0)
Hemoglobin: 12 g/dL (ref 12.0–15.0)
Immature Granulocytes: 0 %
Lymphocytes Relative: 13 %
Lymphs Abs: 1.5 10*3/uL (ref 0.7–4.0)
MCH: 30.8 pg (ref 26.0–34.0)
MCHC: 33.3 g/dL (ref 30.0–36.0)
MCV: 92.3 fL (ref 80.0–100.0)
Monocytes Absolute: 0.6 10*3/uL (ref 0.1–1.0)
Monocytes Relative: 5 %
Neutro Abs: 9.4 10*3/uL — ABNORMAL HIGH (ref 1.7–7.7)
Neutrophils Relative %: 82 %
Platelets: 251 10*3/uL (ref 150–400)
RBC: 3.9 MIL/uL (ref 3.87–5.11)
RDW: 12.4 % (ref 11.5–15.5)
WBC: 11.6 10*3/uL — ABNORMAL HIGH (ref 4.0–10.5)
nRBC: 0 % (ref 0.0–0.2)

## 2021-09-29 LAB — COMPREHENSIVE METABOLIC PANEL
ALT: 12 U/L (ref 0–44)
AST: 17 U/L (ref 15–41)
Albumin: 4.2 g/dL (ref 3.5–5.0)
Alkaline Phosphatase: 71 U/L (ref 38–126)
Anion gap: 11 (ref 5–15)
BUN: 19 mg/dL (ref 8–23)
CO2: 27 mmol/L (ref 22–32)
Calcium: 10.2 mg/dL (ref 8.9–10.3)
Chloride: 100 mmol/L (ref 98–111)
Creatinine, Ser: 0.87 mg/dL (ref 0.44–1.00)
GFR, Estimated: >60 mL/min (ref >60)
Glucose, Bld: 157 mg/dL — ABNORMAL HIGH (ref 70–99)
Potassium: 3.8 mmol/L (ref 3.5–5.1)
Sodium: 138 mmol/L (ref 135–145)
Total Bilirubin: 0.6 mg/dL (ref 0.3–1.2)
Total Protein: 7.9 g/dL (ref 6.5–8.1)

## 2021-09-29 NOTE — Progress Notes (Signed)
C/o finger nails are purple, some diarrhea and nausea. ? ?Refill lorazepam. ? ?PET results. ?

## 2021-09-29 NOTE — Assessment & Plan Note (Addendum)
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  IAPRIL 16th, 2023- PET scan-  Resolution of metabolic activity in the mid esophagus;  Persistent hypermetabolic activity within a subcarinal lymph node. Differential includes residual carcinoma versus reactive adenopathy.  No evidence of distant metastatic disease ? ?#Discussed regarding the next step being evaluation/discussed at the tumor conference.  Options include repeat imaging; endoscopic ultrasound to further evaluate the lymphadenopathy; versus reimaging in few months.  Patient is interested in aggressive follow-up/to include repeat endoscopy to evaluate the status of disease/and also mediastinal adenopathy.  I discussed the option of immunotherapy-if treatment is to be considered.  However await above evaluation ? ?# Mild  Anemia- improved- Hb 12- sec to chemo-STABLE. ? ?# FBG- 154- recommend cutting down carbs- monitor for now. [glucerna protein shakes] ? ?#Debility/risk of falls-discussed with Gwenette Greet; on PT-physical therapy- STABLE.  ? ?# DISPOSITION:  ?# Follow up in 3 weeks-  MD; labs- cbc/cmp;-Dr.B ? ?Cc; Dr.Scott ? ?# I reviewed the blood work- with the patient in detail; also reviewed the imaging independently [as summarized above]; and with the patient in detail.  ? ? ? ?

## 2021-09-29 NOTE — Progress Notes (Signed)
[place] ?OFFICE PROGRESS NOTE ? ?Patient Care Team: ?[name], MD as PCP - General (Internal Medicine) ?Clent Jacks, RN as Oncology Nurse Navigator ?Cammie Sickle, MD as Consulting Physician (Oncology) ? ? Cancer Staging  ?Neoplasm of middle third of esophagus ?Staging form: Esophagus - Squamous Cell Carcinoma, AJCC 8th Edition ?- Clinical: Stage Unknown (cTX, cN1, cM0) - Signed by Cammie Sickle, MD on 05/10/2021 ? ? 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 2.  Starting radiation and chemotherapy from April 14, 2014 ?Patient was started on carboplatinum andTaxol Herve were developed an allergic reaction to Taxol so would be changed to Abraxane ?3.patient has finished 6 cycles of weekly chemotherapy with carboplatinum  and radiation therapy(May 28, 2014) ?4.started on  NIVOLULAMAB because of persistent disease July 02, 2014. ?5.  NIVOLULAMAB was discontinued because of persistent diarrhea in July of 2016.  August of 2016 CT scan was stable so no further chemotherapy ? ?# AUG 25th PET- STABLE RUL MASS [radiation fibrosis; <1cm ? Mediastinal recurrence];  ? ?# DEC 2022-squamous cell carcinoma of the midesophagus -carbo Abraxane weekly with radiation ? ?# AAA/ 3.3x 3.9 Stable [Dr.Schnier] ?-----------------------------------------------------  ? ? ? ?  ?History of lung cancer  ?Malignant neoplasm of right upper lobe of lung ([place])  ?08/07/2019 Initial Diagnosis  ? Malignant neoplasm of right upper lobe of lung ([place]) ?  ? ?INTERVAL HISTORY: Ambulating with a rolling walker.  Accompanied by her  daughter. ? ?Heather Cooper 88 y.o.  female pleasant patient newly diagnosed squamous cell carcinoma of the midesophagus-locally advanced on definitive [carbo-abraxane]chemoradiation is here for follow-up/review results of the PET scan. ? ?Patient complains of brittle nails/discoloration.  Otherwise denies any nausea vomiting or significant diarrhea. ? ?Patient is currently working with physical therapy.  Most improvement of energy levels.  She is more up and about.  No falls. ? ?Patient denies any worsening fatigue or chest pain or shortness of breath or fevers or chills.  She has been drinking of fluids.  Denies any tingling or numbness.  ? ?Review of Systems  ?Constitutional:  Positive for malaise/fatigue. Negative for chills, diaphoresis, fever and weight loss.  ?HENT:  Negative for nosebleeds and sore throat.   ?Eyes:  Negative for double vision.  ?Respiratory:  Negative for hemoptysis, sputum production, shortness of breath and wheezing.   ?Cardiovascular:  Negative for chest pain, palpitations, orthopnea and leg swelling.  ?Gastrointestinal:  Negative for abdominal pain, blood in stool, constipation, diarrhea, heartburn, melena, nausea and vomiting.  ?Genitourinary:  Negative for dysuria, frequency and urgency.  ?Musculoskeletal:  Negative for back pain and joint pain.  ?Skin: Negative.  Negative for itching and rash.  ?Neurological:  Negative for dizziness, tingling, focal weakness, weakness and headaches.  ?Endo/Heme/Allergies:  Does not bruise/bleed easily.  ?Psychiatric/Behavioral:  Negative for depression. The patient is not nervous/anxious and does not have insomnia.   ? ? ?PAST MEDICAL HISTORY :  ?Past Medical History:  ?Diagnosis Date  ? Chemotherapy induced nausea and vomiting   ? Chicken pox   ? Esophageal cancer ([place])   ? Hypercalcemia   ? familial hypocalciuric hypercalcemia  ?  Hypercholesterolemia   ? Hypertension   ? Lung cancer ([place])   ? Osteoporosis   ? Thyroid disease   ? ? ?PAST SURGICAL  HISTORY :   ?Past Surgical History:  ?Procedure Laterality Date  ? ABDOMINAL HYSTERECTOMY    ? partial  ? CHOLECYSTECTOMY    ? COLONOSCOPY WITH PROPOFOL N/A 04/17/2017  ? Procedure: COLONOSCOPY WITH PROPOFOL;  Surgeon: Manya Silvas, MD;  Location: [facility] ENDOSCOPY;  Service: Endoscopy;  Laterality: N/A;  ? ESOPHAGOGASTRODUODENOSCOPY (EGD) WITH PROPOFOL N/A 04/13/2021  ? Procedure: ESOPHAGOGASTRODUODENOSCOPY (EGD) WITH PROPOFOL;  Surgeon: [name], MD;  Location: [facility] ENDOSCOPY;  Service: Endoscopy;  Laterality: N/A;  ? EUS N/A 04/22/2021  ? Procedure: FULL UPPER ENDOSCOPIC ULTRASOUND (EUS) RADIAL;  Surgeon: Jola Schmidt, MD;  Location: [facility] ENDOSCOPY;  Service: Endoscopy;  Laterality: N/A;  [facility] needed  ? transvaginal hysterectomy  04/18/05  ? with anterior colporrhaphy  ? ? ?FAMILY HISTORY :   ?Family History  ?Problem Relation Age of Onset  ? Stroke Mother   ? Hypertension Mother   ? Prostate cancer Father   ? Cancer Father   ?     prostate  ? Heart disease Brother   ?     s/p CABG  ? Colon cancer Neg Hx   ? ? ?SOCIAL HISTORY:   ?Social History  ? ?Tobacco Use  ? Smoking status: Former  ?  Types: Cigarettes  ?  Quit date: 06/13/1997  ?  Years since quitting: 24.3  ? Smokeless tobacco: Never  ?Vaping Use  ? Vaping Use: Never used  ?Substance Use Topics  ? Alcohol use: No  ?  Alcohol/week: 0.0 standard drinks  ? Drug use: No  ? ? ?ALLERGIES:  is allergic to paclitaxel. ? ?MEDICATIONS:  ?Current Outpatient Medications  ?Medication Sig Dispense Refill  ? cholecalciferol (VITAMIN D3) 25 MCG (1000 UNIT) tablet Take 1,000 Units by mouth daily.    ? dexamethasone (DECADRON) 4 MG tablet Take 2 tablets (8 mg total) by mouth daily. Start the day after chemotherapy for 2 days. 30 tablet 1  ? diphenoxylate-atropine (LOMOTIL) 2.5-0.025 MG tablet Take 1 tablet by mouth 4 (four) times daily as needed for diarrhea or loose stools. Take it along with immodium 30 tablet 0  ? fluticasone (FLONASE) 50 MCG/ACT nasal spray Place 2  sprays into both nostrils daily. 16 g 3  ? levocetirizine (XYZAL) 5 MG tablet Take 1 tablet (5 mg total) by mouth every evening. 90 tablet 2  ? LORazepam (ATIVAN) 0.5 MG tablet Take 1 tablet (0.5 mg total) by mouth every 6 (six) hours as needed (Nausea or vomiting). 30 tablet 0  ? olmesartan-hydrochlorothiazide (BENICAR HCT) 20-12.5 MG tablet TAKE 1 TABLET BY MOUTH ONCE DAILY 90 tablet 1  ? pantoprazole (PROTONIX) 40 MG tablet TAKE 1 TABLET BY MOUTH ONCE DAILY 90 tablet 3  ? ondansetron (ZOFRAN) 8 MG tablet Take 1 tablet (8 mg total) by mouth 2 (two) times daily as needed for refractory nausea / vomiting. Start on day 3 after chemo. (Patient not taking: Reported on 09/29/2021) 30 tablet 1  ? prochlorperazine (COMPAZINE) 10 MG tablet Take 1 tablet (10 mg total) by mouth every 6 (six) hours as needed (Nausea or vomiting). (Patient not taking: Reported on 09/29/2021) 30 tablet 1  ? ?No current facility-administered medications for this visit.  ? ?Facility-Administered Medications Ordered in Other Visits  ?Medication Dose Route Frequency Provider Last Rate Last Admin  ? sodium chloride 0.9 % 1,000 mL with potassium chloride  20 mEq, magnesium sulfate 2 g infusion   Intravenous Continuous Forest Gleason, MD   Stopped at 12/04/14 1300  ? ? ?PHYSICAL EXAMINATION: ?ECOG PERFORMANCE STATUS: 0 - Asymptomatic ? ?BP 105/86 (BP Location: Left Arm, Patient Position: Sitting, Cuff Size: Normal)   Pulse (!) 103   Temp 97.6 ?F (36.4 ?C) (Tympanic)   Ht 5' 3"  (1.6 m)   Wt 133 lb 12.8 oz (60.7 kg)   SpO2 100%   BMI 23.70 kg/m?  ? Danley Danker Weights  ? 09/29/21 0941  ?Weight: 133 lb 12.8 oz (60.7 kg)  ? ? ?Physical Exam ?HENT:  ?   Head: Normocephalic and atraumatic.  ?   Mouth/Throat:  ?   Pharynx: No oropharyngeal exudate.  ?Eyes:  ?   Pupils: Pupils are equal, round, and reactive to light.  ?Cardiovascular:  ?   Rate and Rhythm: Normal rate and regular rhythm.  ?Pulmonary:  ?   Effort: Pulmonary effort is normal. No respiratory  distress.  ?   Breath sounds: Normal breath sounds. No wheezing.  ?Abdominal:  ?   General: Bowel sounds are normal. There is no distension.  ?   Palpations: Abdomen is soft. There is no mass.  ?   Tenderness: There is

## 2021-10-04 ENCOUNTER — Other Ambulatory Visit (INDEPENDENT_AMBULATORY_CARE_PROVIDER_SITE_OTHER): Payer: Medicare PPO

## 2021-10-04 DIAGNOSIS — R7989 Other specified abnormal findings of blood chemistry: Secondary | ICD-10-CM | POA: Diagnosis not present

## 2021-10-04 LAB — T3: T3, Total: 151 ng/dL (ref 76–181)

## 2021-10-04 LAB — T4, FREE: Free T4: 0.85 ng/dL (ref 0.60–1.60)

## 2021-10-04 LAB — TSH: TSH: 0.23 u[IU]/mL — ABNORMAL LOW (ref 0.35–5.50)

## 2021-10-07 ENCOUNTER — Encounter: Payer: Self-pay | Admitting: Internal Medicine

## 2021-10-07 NOTE — Progress Notes (Signed)
Review the scans at the cancer conference; recommend further evaluation with endoscopy. Discussed with Adonis Brook, navigator. ? ?I tried calling patient twice and left a voicemail with  recommendations.

## 2021-10-08 ENCOUNTER — Telehealth: Payer: Self-pay

## 2021-10-08 NOTE — Telephone Encounter (Signed)
Spoke with Heather Cooper regarding Dr. Aletha Halim recommendation for EUS to obtain biopsy of subcarinal lymph node. She would like to proceed. First availability for EUS in [place] is in approximately in 3 weeks. Offered to have performed at another facility sooner and she declined and prefers to have at [place]. EUS will be arranged and she will be contacted with date/instructions.  ?

## 2021-10-14 ENCOUNTER — Telehealth: Payer: Self-pay

## 2021-10-14 ENCOUNTER — Other Ambulatory Visit: Payer: Self-pay

## 2021-10-14 NOTE — Telephone Encounter (Signed)
EUS has been scheduled for 10/28/2021 with Dr. Cephas Darby at [facility]. Denies anticoagulants. Instructions sent through [place] and a copy mailed to home address. Encouraged to call with any questions. ?

## 2021-10-15 ENCOUNTER — Other Ambulatory Visit: Payer: Self-pay

## 2021-10-20 ENCOUNTER — Inpatient Hospital Stay: Payer: Medicare PPO | Admitting: Internal Medicine

## 2021-10-20 ENCOUNTER — Inpatient Hospital Stay: Payer: Medicare PPO

## 2021-10-25 ENCOUNTER — Telehealth: Payer: Self-pay

## 2021-10-25 NOTE — Telephone Encounter (Signed)
Per [employer] Auth# [id number] valid 10/28/21-01/26/22 for [id number] out patient For [facility]/Dr Spaete ? ?

## 2021-10-27 ENCOUNTER — Encounter: Payer: Self-pay | Admitting: *Deleted

## 2021-10-28 ENCOUNTER — Other Ambulatory Visit: Payer: Medicare PPO

## 2021-10-28 ENCOUNTER — Ambulatory Visit: Payer: Medicare PPO | Admitting: Certified Registered Nurse Anesthetist

## 2021-10-28 ENCOUNTER — Encounter: Payer: Self-pay | Admitting: *Deleted

## 2021-10-28 ENCOUNTER — Encounter
Admission: RE | Disposition: A | Discharge status: Home / Self Care | Payer: Self-pay | Source: Home / Self Care | Attending: Gastroenterology

## 2021-10-28 ENCOUNTER — Ambulatory Visit
Admission: RE | Admit: 2021-10-28 | Discharge: 2021-10-28 | Disposition: A | Discharge status: Home / Self Care | Payer: Medicare PPO | Attending: Gastroenterology | Admitting: Gastroenterology

## 2021-10-28 DIAGNOSIS — E119 Type 2 diabetes mellitus without complications: Secondary | ICD-10-CM | POA: Diagnosis not present

## 2021-10-28 DIAGNOSIS — C159 Malignant neoplasm of esophagus, unspecified: Secondary | ICD-10-CM | POA: Diagnosis not present

## 2021-10-28 DIAGNOSIS — C771 Secondary and unspecified malignant neoplasm of intrathoracic lymph nodes: Secondary | ICD-10-CM | POA: Insufficient documentation

## 2021-10-28 DIAGNOSIS — K222 Esophageal obstruction: Secondary | ICD-10-CM | POA: Diagnosis not present

## 2021-10-28 DIAGNOSIS — I899 Noninfective disorder of lymphatic vessels and lymph nodes, unspecified: Secondary | ICD-10-CM | POA: Insufficient documentation

## 2021-10-28 DIAGNOSIS — Z87891 Personal history of nicotine dependence: Secondary | ICD-10-CM | POA: Diagnosis not present

## 2021-10-28 DIAGNOSIS — R935 Abnormal findings on diagnostic imaging of other abdominal regions, including retroperitoneum: Secondary | ICD-10-CM | POA: Diagnosis not present

## 2021-10-28 DIAGNOSIS — I10 Essential (primary) hypertension: Secondary | ICD-10-CM | POA: Insufficient documentation

## 2021-10-28 DIAGNOSIS — K219 Gastro-esophageal reflux disease without esophagitis: Secondary | ICD-10-CM | POA: Insufficient documentation

## 2021-10-28 HISTORY — PX: EUS: SHX5427

## 2021-10-28 SURGERY — UPPER ENDOSCOPIC ULTRASOUND (EUS) LINEAR
Anesthesia: General

## 2021-10-28 MED ORDER — GLYCOPYRROLATE 0.2 MG/ML IJ SOLN
INTRAMUSCULAR | Status: DC | PRN
  Administered 2021-10-28: .2 mg via INTRAVENOUS

## 2021-10-28 MED ORDER — PROPOFOL 500 MG/50ML IV EMUL
INTRAVENOUS | Status: DC | PRN
  Administered 2021-10-28: 140 ug/kg/min via INTRAVENOUS

## 2021-10-28 MED ORDER — SODIUM CHLORIDE 0.9 % IV SOLN: INTRAVENOUS | Status: DC

## 2021-10-28 MED ORDER — PHENYLEPHRINE 80 MCG/ML (10ML) SYRINGE FOR IV PUSH (FOR BLOOD PRESSURE SUPPORT)
PREFILLED_SYRINGE | INTRAVENOUS | Status: AC
  Filled 2021-10-28: qty 10

## 2021-10-28 MED ORDER — LIDOCAINE HCL (CARDIAC) PF 100 MG/5ML IV SOSY
PREFILLED_SYRINGE | INTRAVENOUS | Status: DC | PRN
Start: 2021-10-28 — End: 2021-10-28
  Administered 2021-10-28: 80 mg via INTRAVENOUS

## 2021-10-28 MED ORDER — PROPOFOL 10 MG/ML IV BOLUS
INTRAVENOUS | Status: DC | PRN
  Administered 2021-10-28: 20 mg via INTRAVENOUS
  Administered 2021-10-28: 50 mg via INTRAVENOUS
  Administered 2021-10-28 (×2): 20 mg via INTRAVENOUS

## 2021-10-28 MED ORDER — PHENYLEPHRINE HCL (PRESSORS) 10 MG/ML IV SOLN
INTRAVENOUS | Status: DC | PRN
  Administered 2021-10-28: 80 ug via INTRAVENOUS
  Administered 2021-10-28 (×3): 40 ug via INTRAVENOUS

## 2021-10-28 MED ORDER — LIDOCAINE HCL (PF) 2 % IJ SOLN
INTRAMUSCULAR | Status: AC
  Filled 2021-10-28: qty 5

## 2021-10-28 NOTE — Progress Notes (Signed)
Tumor Board Documentation  Heather Cooper was presented by Dr Rogue Bussing at our Tumor Board on 10/28/2021, which included representatives from radiology, pathology, surgical, pharmacy, pulmonology, radiation oncology, navigation, research, internal medicine, palliative care, medical oncology.  Heather Cooper currently presents as a current patient, for discussion with history of the following treatments: active survellience.  Additionally, we reviewed previous medical and familial history, history of present illness, and recent lab results along with all available histopathologic and imaging studies. The tumor board considered available treatment options and made the following recommendations: Additional screening (EUS today)    The following procedures/referrals were also placed: No orders of the defined types were placed in this encounter.   Clinical Trial Status: not discussed   Staging used: To be determined AJCC Staging:       Group: Esophageal cancer   National site-specific guidelines   were discussed with respect to the case.  Tumor board is a meeting of clinicians from various specialty areas who evaluate and discuss patients for whom a multidisciplinary approach is being considered. Final determinations in the plan of care are those of the provider(s). The responsibility for follow up of recommendations given during tumor board is that of the provider.   Today's extended care, comprehensive team conference, Heather Cooper was not present for the discussion and was not examined.   Multidisciplinary Tumor Board is a multidisciplinary case peer review process.  Decisions discussed in the Multidisciplinary Tumor Board reflect the opinions of the specialists present at the conference without having examined the patient.  Ultimately, treatment and diagnostic decisions rest with the primary provider(s) and the patient.

## 2021-10-28 NOTE — Op Note (Signed)
[facility] Gastroenterology Patient Name: Heather Cooper Procedure Date: 10/28/2021 12:12 PM MRN: [id number] Account #: [id number] Date of Birth: 01-Feb-1931 Admit Type: Outpatient Age: 88 Room: [facility] ENDO ROOM 3 Gender: Female Note Status: Finalized Instrument Name: Radial EUS [id number] Procedure:             Upper EUS Indications:           Abnormal abdominal PET scan Patient Profile:       Refer to note in patient chart for documentation of                         history and physical. Providers:             Lenetta Quaker. Cephas Darby, MD Referring MD:          [name], MD (Referring MD), Charlaine Dalton                         (Referring MD) Medicines:             Monitored Anesthesia Care Complications:         No immediate complications. Procedure:             Pre-Anesthesia Assessment:                        - Monitored anesthesia care was determined to be                         medically necessary for this procedure based on                         complex procedure (ERCP, EUS).                        After obtaining informed consent, the endoscope was                         passed under direct vision. Throughout the procedure,                         the patient's blood pressure, pulse, and oxygen                         saturations were monitored continuously. The Endoscope                         was introduced through the mouth, and advanced to the                         second part of duodenum. Findings:      ENDOSCOPIC FINDING: :      One benign-appearing, intrinsic moderate (circumferential scarring or       stenosis; an endoscope may pass) stenosis was found 25 to 27 cm from the       incisors. This stenosis measured 1 cm (inner diameter) x 2 cm (in       length). The stenosis was traversed.No clear residual SCC was       visualized. Biopsies were taken with a cold forceps for histology.      The entire examined stomach was endoscopically  normal.  The examined duodenum was endoscopically normal.      ENDOSONOGRAPHIC FINDING: :      One abnormal lymph node was visualized in the subcarinal mediastinum       (level 7) with the ultrasound probe located 27 cm from the incisors. It       measured 10 mm by 8 mm in maximal cross-sectional diameter. The node was       oval, hypoechoic and had well defined margins. Fine needle biopsy was       performed. Color Doppler imaging was utilized prior to needle puncture       to confirm a lack of significant vascular structures within the needle       path. Five passes were made with the 25 gauge SharkCore biopsy needle       using a transesophageal approach. A visible core of tissue was obtained.       Preliminary cytologic examination and touch preps were not performed.       The cellularity of the specimen was adequate. Final cytology results are       pending. Impression:            EGD Impression:                        - Benign-appearing esophageal stenosis. Biopsied.                        - Normal stomach.                        - Normal examined duodenum.                        EUS Impression:                        - One abnormal lymph node was visualized in the                         subcarinal mediastinum (level 7). Tissue was obtained                         from this exam, and results are pending. However, the                         endosonographic appearance is suspicious for                         metastatic esophageal squamous cell carcinoma. Fine                         needle biopsy performed.                        - Neither the radial nor the linear EUS could be                         passed beyond the esophageal stenosis and the more                         distal GI tract could not be examined. Recommendation:        - Discharge patient  to home.                        - Await cytology results and await path results.                        - Return to  referring physician as previously                         scheduled.                        - The findings and recommendations were discussed with                         the patient and their family. Procedure Code(s):     --- Professional ---                        [phone], Esophagogastroduodenoscopy, flexible,                         transoral; with transendoscopic ultrasound-guided                         intramural or transmural fine needle                         aspiration/biopsy(s), (includes endoscopic ultrasound                         examination limited to the esophagus, stomach or                         duodenum, and adjacent structures)                        43239, 59, Esophagogastroduodenoscopy, flexible,                         transoral; with biopsy, single or multiple Diagnosis Code(s):     --- Professional ---                        K22.2, Esophageal obstruction                        I89.9, Noninfective disorder of lymphatic vessels and                         lymph nodes, unspecified                        R93.5, Abnormal findings on diagnostic imaging of                         other abdominal regions, including retroperitoneum CPT copyright 2019 [employer]. All rights reserved. The codes documented in this report are preliminary and upon coder review may  be revised to meet current compliance requirements. Attending Participation:      I personally performed the entire procedure. Dr. Lenetta Quaker. Cephas Darby, MD Lenetta Quaker. Humzah Harty, MD 10/28/2021 2:00:35 PM This report has been signed electronically. Number of Addenda:  0 Note Initiated On: 10/28/2021 12:12 PM Estimated Blood Loss:  Estimated blood loss was minimal.      [facility]

## 2021-10-28 NOTE — Transfer of Care (Signed)
Immediate Anesthesia Transfer of Care Note  Patient: Heather Cooper  Procedure(s) Performed: UPPER ENDOSCOPIC ULTRASOUND (EUS) LINEAR  Patient Location: PACU  Anesthesia Type:General  Level of Consciousness: drowsy  Airway & Oxygen Therapy: Patient Spontanous Breathing  Post-op Assessment: Report given to RN and Post -op Vital signs reviewed and stable  Post vital signs: Reviewed and stable  Last Vitals:  Vitals Value Taken Time  BP    Temp    Pulse    Resp    SpO2      Last Pain:  Vitals:   10/28/21 1235  TempSrc: Temporal  PainSc: 0-No pain         Complications: No notable events documented.

## 2021-10-28 NOTE — H&P (Signed)
  PRE-PROCEDURE HISTORY AND PHYSICAL   Heather Cooper presents for her scheduled Procedure(s): UPPER ENDOSCOPIC ULTRASOUND (EUS) LINEAR.  The indication for the procedure(s) is Esophageal cancer/PET positive subcarinal lymph nodes needs biopsy.  There have been no significant recent changes in the patient's medical status.  Past Medical History:  Diagnosis Date   Chemotherapy induced nausea and vomiting    Chicken pox    Esophageal cancer (HCC)    Hypercalcemia    familial hypocalciuric hypercalcemia   Hypercholesterolemia    Hypertension    Lung cancer ([place])    Osteoporosis    Thyroid disease     Past Surgical History:  Procedure Laterality Date   ABDOMINAL HYSTERECTOMY     partial   CHOLECYSTECTOMY     COLONOSCOPY WITH PROPOFOL N/A 04/17/2017   Procedure: COLONOSCOPY WITH PROPOFOL;  Surgeon: Manya Silvas, MD;  Location: [facility] ENDOSCOPY;  Service: Endoscopy;  Laterality: N/A;   ESOPHAGOGASTRODUODENOSCOPY (EGD) WITH PROPOFOL N/A 04/13/2021   Procedure: ESOPHAGOGASTRODUODENOSCOPY (EGD) WITH PROPOFOL;  Surgeon: [name], MD;  Location: [facility] ENDOSCOPY;  Service: Endoscopy;  Laterality: N/A;   EUS N/A 04/22/2021   Procedure: FULL UPPER ENDOSCOPIC ULTRASOUND (EUS) RADIAL;  Surgeon: Jola Schmidt, MD;  Location: [facility] ENDOSCOPY;  Service: Endoscopy;  Laterality: N/A;  [facility] needed   transvaginal hysterectomy  04/18/05   with anterior colporrhaphy    Allergies Allergies  Allergen Reactions   Paclitaxel Other (See Comments)    Chest tightness    Medications LORazepam, cholecalciferol, dexamethasone, diphenoxylate-atropine, fluticasone, levocetirizine, olmesartan-hydrochlorothiazide, ondansetron, pantoprazole, and prochlorperazine  Physical Examination  Body mass index is 24.09 kg/m. BP 128/66   Pulse 85   Temp (!) 96.6 F (35.9 C) (Temporal)   Resp 18   Ht 5\' 3"  (1.6 m)   Wt 61.7 kg   SpO2 100%   BMI 24.09 kg/m  General:   Alert,  pleasant and cooperative in  NAD Head:  Normocephalic and atraumatic. Neck:  Supple; no masses or thyromegaly. Lungs: No Wheezes  Heart:  Regular  Abdomen:  Soft, nontender and nondistended. Normal bowel sounds, without guarding, and without rebound.   Neurologic:  Alert and  oriented x4  ASSESSMENT AND PLAN  Heather Cooper has been evaluated and deemed appropriate to undergo the planned Procedure(s): UPPER ENDOSCOPIC ULTRASOUND (EUS) LINEAR.

## 2021-10-28 NOTE — Progress Notes (Signed)
Fine needle biopsy of paraesophageal lymph node was sent with lab staff from [place].

## 2021-10-28 NOTE — Anesthesia Procedure Notes (Signed)
Date/Time: 10/28/2021 1:02 PM Performed by: Demetrius Charity, CRNA Pre-anesthesia Checklist: Patient identified, Emergency Drugs available, Suction available, Patient being monitored and Timeout performed Patient Re-evaluated:Patient Re-evaluated prior to induction Oxygen Delivery Method: Nasal cannula Induction Type: IV induction Placement Confirmation: CO2 detector and positive ETCO2

## 2021-10-28 NOTE — Anesthesia Preprocedure Evaluation (Signed)
Anesthesia Evaluation  Patient identified by MRN, date of birth, ID band Patient awake    Reviewed: Allergy & Precautions, NPO status , Patient's Chart, lab work & pertinent test results  History of Anesthesia Complications Negative for: history of anesthetic complications  Airway Mallampati: III  TM Distance: >3 FB Neck ROM: full    Dental  (+) Missing   Pulmonary neg pulmonary ROS, neg shortness of breath, former smoker,    Pulmonary exam normal        Cardiovascular Exercise Tolerance: Good hypertension, (-) anginaNormal cardiovascular exam     Neuro/Psych  Headaches, negative psych ROS   GI/Hepatic Neg liver ROS, GERD  ,  Endo/Other  diabetes, Type 2  Renal/GU negative Renal ROS  negative genitourinary   Musculoskeletal   Abdominal   Peds  Hematology negative hematology ROS (+)   Anesthesia Other Findings Past Medical History: No date: Chicken pox No date: Hypercalcemia     Comment:  familial hypocalciuric hypercalcemia No date: Hypercholesterolemia No date: Hypertension No date: Lung cancer ([place]) No date: Osteoporosis No date: Thyroid disease  Past Surgical History: No date: ABDOMINAL HYSTERECTOMY     Comment:  partial No date: CHOLECYSTECTOMY 04/17/2017: COLONOSCOPY WITH PROPOFOL; N/A     Comment:  Procedure: COLONOSCOPY WITH PROPOFOL;  Surgeon: Manya Silvas, MD;  Location: [facility] ENDOSCOPY;  Service:               Endoscopy;  Laterality: N/A; 04/13/2021: ESOPHAGOGASTRODUODENOSCOPY (EGD) WITH PROPOFOL; N/A     Comment:  Procedure: ESOPHAGOGASTRODUODENOSCOPY (EGD) WITH               PROPOFOL;  Surgeon: [name], MD;  Location: [facility]               ENDOSCOPY;  Service: Endoscopy;  Laterality: N/A; 04/18/05: transvaginal hysterectomy     Comment:  with anterior colporrhaphy     Reproductive/Obstetrics negative OB ROS                             Anesthesia  Physical  Anesthesia Plan  ASA: 3  Anesthesia Plan: General   Post-op Pain Management:    Induction: Intravenous  PONV Risk Score and Plan: Propofol infusion and TIVA  Airway Management Planned: Natural Airway and Nasal Cannula  Additional Equipment:   Intra-op Plan:   Post-operative Plan:   Informed Consent: I have reviewed the patients History and Physical, chart, labs and discussed the procedure including the risks, benefits and alternatives for the proposed anesthesia with the patient or authorized representative who has indicated his/her understanding and acceptance.     Dental Advisory Given  Plan Discussed with: Anesthesiologist, CRNA and Surgeon  Anesthesia Plan Comments: (Patient consented for risks of anesthesia including but not limited to:  - adverse reactions to medications - risk of airway placement if required - damage to eyes, teeth, lips or other oral mucosa - nerve damage due to positioning  - sore throat or hoarseness - Damage to heart, brain, nerves, lungs, other parts of body or loss of life  Patient voiced understanding.)        Anesthesia Quick Evaluation

## 2021-10-29 ENCOUNTER — Encounter: Payer: Self-pay | Admitting: Gastroenterology

## 2021-10-29 NOTE — Anesthesia Postprocedure Evaluation (Signed)
Anesthesia Post Note  Patient: ADITI ROVIRA  Procedure(s) Performed: UPPER ENDOSCOPIC ULTRASOUND (EUS) LINEAR  Patient location during evaluation: Endoscopy Anesthesia Type: General Level of consciousness: awake and alert Pain management: pain level controlled Vital Signs Assessment: post-procedure vital signs reviewed and stable Respiratory status: spontaneous breathing, nonlabored ventilation, respiratory function stable and patient connected to nasal cannula oxygen Cardiovascular status: blood pressure returned to baseline and stable Postop Assessment: no apparent nausea or vomiting Anesthetic complications: no   No notable events documented.   Last Vitals:  Vitals:   10/28/21 1405 10/28/21 1415  BP: 129/64 (!) 146/62  Pulse: 79 74  Resp:    Temp:    SpO2: 100% 100%    Last Pain:  Vitals:   10/29/21 0738  TempSrc:   PainSc: 0-No pain                 Martha Clan

## 2021-11-01 ENCOUNTER — Inpatient Hospital Stay (HOSPITAL_BASED_OUTPATIENT_CLINIC_OR_DEPARTMENT_OTHER): Payer: Medicare PPO | Admitting: Internal Medicine

## 2021-11-01 ENCOUNTER — Encounter: Payer: Self-pay | Admitting: Internal Medicine

## 2021-11-01 ENCOUNTER — Other Ambulatory Visit: Payer: Self-pay

## 2021-11-01 ENCOUNTER — Inpatient Hospital Stay: Payer: Medicare PPO | Attending: Internal Medicine

## 2021-11-01 DIAGNOSIS — C154 Malignant neoplasm of middle third of esophagus: Secondary | ICD-10-CM | POA: Insufficient documentation

## 2021-11-01 DIAGNOSIS — D649 Anemia, unspecified: Secondary | ICD-10-CM | POA: Insufficient documentation

## 2021-11-01 DIAGNOSIS — Z87891 Personal history of nicotine dependence: Secondary | ICD-10-CM | POA: Insufficient documentation

## 2021-11-01 DIAGNOSIS — C3411 Malignant neoplasm of upper lobe, right bronchus or lung: Secondary | ICD-10-CM | POA: Insufficient documentation

## 2021-11-01 DIAGNOSIS — D490 Neoplasm of unspecified behavior of digestive system: Secondary | ICD-10-CM

## 2021-11-01 DIAGNOSIS — Z79899 Other long term (current) drug therapy: Secondary | ICD-10-CM | POA: Diagnosis not present

## 2021-11-01 LAB — CBC WITH DIFFERENTIAL/PLATELET
Abs Immature Granulocytes: 0.03 10*3/uL (ref 0.00–0.07)
Basophils Absolute: 0 10*3/uL (ref 0.0–0.1)
Basophils Relative: 1 %
Eosinophils Absolute: 0.2 10*3/uL (ref 0.0–0.5)
Eosinophils Relative: 3 %
HCT: 34.2 % — ABNORMAL LOW (ref 36.0–46.0)
Hemoglobin: 11.5 g/dL — ABNORMAL LOW (ref 12.0–15.0)
Immature Granulocytes: 0 %
Lymphocytes Relative: 19 %
Lymphs Abs: 1.4 10*3/uL (ref 0.7–4.0)
MCH: 29.5 pg (ref 26.0–34.0)
MCHC: 33.6 g/dL (ref 30.0–36.0)
MCV: 87.7 fL (ref 80.0–100.0)
Monocytes Absolute: 0.7 10*3/uL (ref 0.1–1.0)
Monocytes Relative: 10 %
Neutro Abs: 4.9 10*3/uL (ref 1.7–7.7)
Neutrophils Relative %: 67 %
Platelets: 246 10*3/uL (ref 150–400)
RBC: 3.9 MIL/uL (ref 3.87–5.11)
RDW: 12.3 % (ref 11.5–15.5)
WBC: 7.3 10*3/uL (ref 4.0–10.5)
nRBC: 0 % (ref 0.0–0.2)

## 2021-11-01 LAB — COMPREHENSIVE METABOLIC PANEL
ALT: 10 U/L (ref 0–44)
AST: 17 U/L (ref 15–41)
Albumin: 3.9 g/dL (ref 3.5–5.0)
Alkaline Phosphatase: 72 U/L (ref 38–126)
Anion gap: 6 (ref 5–15)
BUN: 16 mg/dL (ref 8–23)
CO2: 28 mmol/L (ref 22–32)
Calcium: 9.5 mg/dL (ref 8.9–10.3)
Chloride: 100 mmol/L (ref 98–111)
Creatinine, Ser: 0.82 mg/dL (ref 0.44–1.00)
GFR, Estimated: >60 mL/min (ref >60)
Glucose, Bld: 126 mg/dL — ABNORMAL HIGH (ref 70–99)
Potassium: 3.4 mmol/L — ABNORMAL LOW (ref 3.5–5.1)
Sodium: 134 mmol/L — ABNORMAL LOW (ref 135–145)
Total Bilirubin: 0.4 mg/dL (ref 0.3–1.2)
Total Protein: 7.8 g/dL (ref 6.5–8.1)

## 2021-11-01 LAB — CYTOLOGY - NON PAP

## 2021-11-01 NOTE — Assessment & Plan Note (Addendum)
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  APRIL 16th, 2023- PET scan-  Resolution of metabolic activity in the mid esophagus;  Persistent hypermetabolic activity within a subcarinal lymph node. Differential includes residual carcinoma versus reactive adenopathy.  No evidence of distant metastatic disease. MAY 18th, 2023- Status post endoscopic ultrasound/biopsy of the mediastinal/subcarinal lymph node.  Also discussed at tumor conference.  # currently awaiting biopsy results today;  discussed with Dr.[email or web address] If needed/ or recurrent disease- will plan immunotherapy. I discussed the mechanism of action; The goal of therapy is palliative; and length of treatments are likely ongoing/based upon the results of the scans. Discussed the potential side effects of immunotherapy including but not limited to diarrhea; skin rash; elevated LFTs/endocrine abnormalities etc.  # Cough-? Sec to reflux; continue allergy medications/PPI.   # Mild  Anemia- improved- Hb 11-12- sec to chemo-STABLE.  # FBG- 126- recommend cutting down carbs- monitor for now. [glucerna protein shakes]; Improved.   #Debility/risk of falls s/p evaluation with aureen; on PT-physical therapy- STABLE.   # DISPOSITION:  # Follow up TBD- Dr.B  Cc; Dr.Scott

## 2021-11-01 NOTE — Progress Notes (Signed)
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  Starting radiation and chemotherapy from April 14, 2014 Patient was started on carboplatinum andTaxol Herve were developed an allergic reaction to Taxol so would be changed to Abraxane 3.patient has finished 6 cycles of weekly chemotherapy with carboplatinum  and radiation therapy(May 28, 2014) 4.started on  NIVOLULAMAB because of persistent disease July 02, 2014. 5.  NIVOLULAMAB was discontinued because of persistent diarrhea in July of 2016.  August of 2016 CT scan was stable so no further chemotherapy  # AUG 25th PET- STABLE RUL MASS [radiation fibrosis; <1cm ? 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   APRIL 16th, 2023- PET scan-  Resolution of metabolic activity in the mid esophagus;  Persistent hypermetabolic activity within a subcarinal lymph node. Differential includes residual carcinoma versus reactive adenopathy.  No evidence of distant metastatic disease.  # MAY 18th, 2023- s/p EUS  [Dr.Spaete]-endoscopic biopsy of the mediastinal lymph node.   # AAA/ 3.3x 3.9 Stable [Dr.Schnier] -----------------------------------------------------       History of lung cancer  Malignant neoplasm of right upper lobe of lung ([place])  08/07/2019 Initial Diagnosis   Malignant neoplasm of right upper lobe of lung ([place])    INTERVAL HISTORY: Ambulating with a rolling walker.  Accompanied by her daughter.  Heather Cooper 88 y.o.  female [place] patient newly diagnosed squamous cell carcinoma of the midesophagus-locally advanced on definitive [carbo-abraxane]chemoradiation -with concern for persistent/recurrent disease in the mediastinum is here for follow-up after endoscopic ultrasound to review the biopsy.  Patient appetite is improving.  Denies any weight loss.  Notes have improvement of her difficulty swallowing.  Has chronic mild cough but any worse.  Continues to work with physical therapy.   Review of Systems  Constitutional:  Positive for malaise/fatigue. Negative for chills, diaphoresis, fever and weight loss.  HENT:  Negative for nosebleeds and sore throat.   Eyes:  Negative for double vision.  Respiratory:  Negative for hemoptysis, sputum production, shortness of breath and wheezing.   Cardiovascular:  Negative for chest pain, palpitations, orthopnea and leg swelling.  Gastrointestinal:  Negative for abdominal pain, blood in stool, constipation, diarrhea, heartburn, melena, nausea and vomiting.  Genitourinary:  Negative for dysuria, frequency and urgency.  Musculoskeletal:  Negative for back pain and joint pain.  Skin: Negative.  Negative for itching and rash.  Neurological:  Negative for dizziness, tingling, focal weakness, weakness and headaches.  Endo/Heme/Allergies:  Does not bruise/bleed easily.  Psychiatric/Behavioral:  Negative for depression. The patient is not nervous/anxious and does not have insomnia.     PAST MEDICAL HISTORY :  Past Medical History:   Diagnosis Date   Chemotherapy induced nausea and vomiting    Chicken pox    Esophageal cancer (HCC)    Hypercalcemia    familial hypocalciuric hypercalcemia   Hypercholesterolemia    Hypertension    Lung cancer ([place])    Osteoporosis    Thyroid disease     PAST SURGICAL HISTORY :   Past Surgical History:  Procedure Laterality Date   ABDOMINAL HYSTERECTOMY     partial   CHOLECYSTECTOMY     COLONOSCOPY WITH PROPOFOL N/A 04/17/2017   Procedure: COLONOSCOPY WITH PROPOFOL;  Surgeon: Manya Silvas, MD;  [facility] ENDOSCOPY;  Service: Endoscopy;  Laterality: N/A;   ESOPHAGOGASTRODUODENOSCOPY (EGD) WITH PROPOFOL N/A 04/13/2021   Procedure: ESOPHAGOGASTRODUODENOSCOPY (EGD) WITH PROPOFOL;  Surgeon: [name], MD;  Location: [facility] ENDOSCOPY;  Service: Endoscopy;  Laterality: N/A;   EUS N/A 04/22/2021   Procedure: FULL UPPER ENDOSCOPIC ULTRASOUND (EUS) RADIAL;  Surgeon: Jola Schmidt, MD;  Location: [facility] ENDOSCOPY;  Service: Endoscopy;  Laterality: N/A;  [facility] needed   EUS N/A 10/28/2021   Procedure: UPPER ENDOSCOPIC ULTRASOUND (EUS) LINEAR;  Surgeon: Reita Cliche, MD;  Location: [facility] ENDOSCOPY;  Service: Gastroenterology;  Laterality: N/A;  [facility]   transvaginal hysterectomy  04/18/05   with anterior colporrhaphy    FAMILY HISTORY :   Family History  Problem Relation Age of Onset   Stroke Mother    Hypertension Mother    Prostate cancer Father    Cancer Father        prostate   Heart disease Brother        s/p CABG   Colon cancer Neg Hx     SOCIAL HISTORY:   Social History   Tobacco Use   Smoking status: Former    Types: Cigarettes    Quit date: 06/13/1997    Years since quitting: 24.4   Smokeless tobacco: Never  Vaping Use   Vaping Use: Never used  Substance Use Topics   Alcohol use: No    Alcohol/week: 0.0 standard drinks   Drug use: No    ALLERGIES:  is allergic to paclitaxel.  MEDICATIONS:  Current Outpatient Medications  Medication Sig Dispense  Refill   cholecalciferol (VITAMIN D3) 25 MCG (1000 UNIT) tablet Take 1,000 Units by mouth daily.     dexamethasone (DECADRON) 4 MG tablet Take 2 tablets (8 mg total) by mouth daily. Start the day after chemotherapy for 2 days. 30 tablet 1   diphenoxylate-atropine (LOMOTIL) 2.5-0.025 MG tablet Take 1 tablet by mouth 4 (four) times daily as needed for diarrhea or loose stools. Take it along with immodium 30 tablet 0   fluticasone (FLONASE) 50 MCG/ACT nasal spray Place 2 sprays into both nostrils daily. 16 g 3   levocetirizine (XYZAL) 5 MG tablet Take 1 tablet (5 mg total) by mouth every evening. 90 tablet 2   LORazepam (ATIVAN) 0.5 MG tablet Take 1 tablet (0.5 mg total) by mouth every 6 (six) hours as needed (Nausea or vomiting). 30 tablet 0   olmesartan-hydrochlorothiazide (BENICAR HCT) 20-12.5 MG tablet TAKE 1 TABLET BY MOUTH ONCE DAILY 90 tablet 1   ondansetron (ZOFRAN) 8 MG tablet Take 1 tablet (8 mg total) by mouth 2 (two) times daily as needed for refractory nausea / vomiting. Start on day 3 after chemo. (Patient not taking: Reported on 09/29/2021) 30 tablet 1   pantoprazole (PROTONIX) 40 MG tablet TAKE 1 TABLET BY MOUTH ONCE DAILY 90 tablet 3   prochlorperazine (  COMPAZINE) 10 MG tablet Take 1 tablet (10 mg total) by mouth every 6 (six) hours as needed (Nausea or vomiting). (Patient not taking: Reported on 09/29/2021) 30 tablet 1   No current facility-administered medications for this visit.   Facility-Administered Medications Ordered in Other Visits  Medication Dose Route Frequency Provider Last Rate Last Admin   sodium chloride 0.9 % 1,000 mL with potassium chloride 20 mEq, magnesium sulfate 2 g infusion   Intravenous Continuous Forest Gleason, MD   Stopped at 12/04/14 1300    PHYSICAL EXAMINATION: ECOG PERFORMANCE STATUS: 0 - Asymptomatic  BP 126/60 (BP Location: Left Arm, Patient Position: Sitting)   Pulse 95   Temp 98.7 F (37.1 C) (Tympanic)   Ht 5' 3"  (1.6 m)   Wt 133 lb 9.6 oz  (60.6 kg)   BMI 23.67 kg/m   Filed Weights   11/01/21 0924  Weight: 133 lb 9.6 oz (60.6 kg)     Physical Exam HENT:     Head: Normocephalic and atraumatic.     Mouth/Throat:     Pharynx: No oropharyngeal exudate.  Eyes:     Pupils: Pupils are equal, round, and reactive to light.  Cardiovascular:     Rate and Rhythm: Normal rate and regular rhythm.  Pulmonary:     Effort: Pulmonary effort is normal. No respiratory distress.     Breath sounds: Normal breath sounds. No wheezing.  Abdominal:     General: Bowel sounds are normal. There is no distension.     Palpations: Abdomen is soft. There is no mass.     Tenderness: There is no abdominal tenderness. There is no guarding or rebound.  Musculoskeletal:        General: No tenderness. Normal range of motion.     Cervical back: Normal range of motion and neck supple.  Skin:    General: Skin is warm.  Neurological:     Mental Status: She is alert and oriented to person, place, and time.  Psychiatric:        Mood and Affect: Affect normal.     LABORATORY DATA:  I have reviewed the data as listed    Component Value Date/Time   NA 134 (L) 11/01/2021 0908   NA 130 (L) 10/09/2014 0846   K 3.4 (L) 11/01/2021 0908   K 4.1 10/09/2014 0846   CL 100 11/01/2021 0908   CL 98 (L) 10/09/2014 0846   CO2 28 11/01/2021 0908   CO2 25 10/09/2014 0846   GLUCOSE 126 (H) 11/01/2021 0908   GLUCOSE 161 (H) 10/09/2014 0846   BUN 16 11/01/2021 0908   BUN 14 10/09/2014 0846   CREATININE 0.82 11/01/2021 0908   CREATININE 0.70 10/09/2014 0846   CALCIUM 9.5 11/01/2021 0908   CALCIUM 9.1 10/09/2014 0846   PROT 7.8 11/01/2021 0908   PROT 7.3 10/09/2014 0846   ALBUMIN 3.9 11/01/2021 0908   ALBUMIN 3.6 10/09/2014 0846   AST 17 11/01/2021 0908   AST 16 10/09/2014 0846   ALT 10 11/01/2021 0908   ALT 13 (L) 10/09/2014 0846   ALKPHOS 72 11/01/2021 0908   ALKPHOS 70 10/09/2014 0846   BILITOT 0.4 11/01/2021 0908   BILITOT 1.0 10/09/2014 0846    GFRNONAA >60 11/01/2021 0908   GFRNONAA >60 10/09/2014 0846   GFRAA >60 02/05/2020 1004   GFRAA >60 10/09/2014 0846    No results found for: SPEP, UPEP  Lab Results  Component Value Date   WBC 7.3 11/01/2021   NEUTROABS 4.9 11/01/2021  HGB 11.5 (L) 11/01/2021   HCT 34.2 (L) 11/01/2021   MCV 87.7 11/01/2021   PLT 246 11/01/2021      Chemistry      Component Value Date/Time   NA 134 (L) 11/01/2021 0908   NA 130 (L) 10/09/2014 0846   K 3.4 (L) 11/01/2021 0908   K 4.1 10/09/2014 0846   CL 100 11/01/2021 0908   CL 98 (L) 10/09/2014 0846   CO2 28 11/01/2021 0908   CO2 25 10/09/2014 0846   BUN 16 11/01/2021 0908   BUN 14 10/09/2014 0846   CREATININE 0.82 11/01/2021 0908   CREATININE 0.70 10/09/2014 0846   GLU 111 03/18/2014 1048      Component Value Date/Time   CALCIUM 9.5 11/01/2021 0908   CALCIUM 9.1 10/09/2014 0846   ALKPHOS 72 11/01/2021 0908   ALKPHOS 70 10/09/2014 0846   AST 17 11/01/2021 0908   AST 16 10/09/2014 0846   ALT 10 11/01/2021 0908   ALT 13 (L) 10/09/2014 0846   BILITOT 0.4 11/01/2021 0908   BILITOT 1.0 10/09/2014 0846       RADIOGRAPHIC STUDIES: I have personally reviewed the radiological images as listed and agreed with the findings in the report. No results found.    ASSESSMENT & PLAN:  Neoplasm of middle third of esophagus #Squamous cell carcinoma of the midesophagus-NOV 2022-PET scan  TxN1M0 [locally advanced;  s/p  concurrent chemoradiation  [finished Jan 25 th, 2023].  APRIL 16th, 2023- PET scan-  Resolution of metabolic activity in the mid esophagus;  Persistent hypermetabolic activity within a subcarinal lymph node. Differential includes residual carcinoma versus reactive adenopathy.  No evidence of distant metastatic disease. MAY 18th, 2023- Status post endoscopic ultrasound/biopsy of the mediastinal/subcarinal lymph node.  Also discussed at tumor conference.  # currently awaiting biopsy results today;  discussed with Dr.Rubinas.i If  needed/ or recurrent disease- will plan immunotherapy. I discussed the mechanism of action; The goal of therapy is palliative; and length of treatments are likely ongoing/based upon the results of the scans. Discussed the potential side effects of immunotherapy including but not limited to diarrhea; skin rash; elevated LFTs/endocrine abnormalities etc.  # Cough-? Sec to reflux; continue allergy medications/PPI.   # Mild  Anemia- improved- Hb 11-12- sec to chemo-STABLE.  # FBG- 126- recommend cutting down carbs- monitor for now. [glucerna protein shakes]; Improved.   #Debility/risk of falls s/p evaluation with aureen; on PT-physical therapy- STABLE.   # DISPOSITION:  # Follow up TBD- Dr.B  Cc; Dr.Scott      No orders of the defined types were placed in this encounter.   All questions were answered. The patient knows to call the clinic with any problems, questions or concerns.      Cammie Sickle, MD 11/01/2021 11:08 AM

## 2021-11-02 ENCOUNTER — Encounter: Payer: Self-pay | Admitting: Internal Medicine

## 2021-11-02 LAB — SURGICAL PATHOLOGY

## 2021-11-02 NOTE — Progress Notes (Signed)
I left a voicemail for the patient to discuss the results of the biopsy. Will try to reach out again tomorrow. GB

## 2021-11-04 ENCOUNTER — Other Ambulatory Visit: Payer: Self-pay

## 2021-11-04 ENCOUNTER — Other Ambulatory Visit: Payer: Self-pay | Admitting: Internal Medicine

## 2021-11-04 DIAGNOSIS — R531 Weakness: Secondary | ICD-10-CM

## 2021-11-04 NOTE — Progress Notes (Signed)
Spoke to patient regarding results of the biopsy positive for malignancy.  Recommend immunotherapy.  Schedule follow up in week of June 6th-MD; labs- cbc/cmp; TSH; OPDIVO.   Thanks, GB

## 2021-11-04 NOTE — Progress Notes (Signed)
Labs entered.

## 2021-11-04 NOTE — Progress Notes (Signed)
DISCONTINUE ON PATHWAY REGIMEN - Gastroesophageal     Administer weekly during RT:     Paclitaxel      Carboplatin   **Always confirm dose/schedule in your pharmacy ordering system**  REASON: Disease Progression PRIOR TREATMENT: [id number]: Carboplatin + Paclitaxel (2/50) Weekly (x 5-6 Weeks) with Concurrent RT TREATMENT RESPONSE: Progressive Disease (PD)  START ON PATHWAY REGIMEN - Gastroesophageal     A cycle is every 14 days:     Nivolumab   **Always confirm dose/schedule in your pharmacy ordering system**  Patient Characteristics: Distant Metastases (cM1/pM1) / Locally Recurrent Disease, Squamous Cell, Esophageal & GE Junction, Second Line, MSS/pMMR or MSI Unknown, PD-L1 Expression CPS < 10/Negative/Unknown Therapeutic Status: Local Recurrence (No Additional Staging) Histology: Squamous Cell Disease Classification: Esophageal Line of Therapy: Second Line Microsatellite/Mismatch Repair Status: Unknown PD-L1 Expression Status: Awaiting Test Results Intent of Therapy: Non-Curative / Palliative Intent, Discussed with Patient

## 2021-11-11 NOTE — Progress Notes (Signed)
Pharmacist Chemotherapy Monitoring - Initial Assessment    Anticipated start date: 11/19/21   The following has been reviewed per standard work regarding the patient's treatment regimen: The patient's diagnosis, treatment plan and drug doses, and organ/hematologic function Lab orders and baseline tests specific to treatment regimen  The treatment plan start date, drug sequencing, and pre-medications Prior authorization status  Patient's documented medication list, including drug-drug interaction screen and prescriptions for anti-emetics and supportive care specific to the treatment regimen The drug concentrations, fluid compatibility, administration routes, and timing of the medications to be used The patient's access for treatment and lifetime cumulative dose history, if applicable  The patient's medication allergies and previous infusion related reactions, if applicable   Changes made to treatment plan:  treatment plan date  Follow up needed:  [place], [facility], 11/11/2021  2:53 PM

## 2021-11-19 ENCOUNTER — Encounter: Payer: Self-pay | Admitting: Internal Medicine

## 2021-11-19 ENCOUNTER — Other Ambulatory Visit: Payer: Self-pay

## 2021-11-19 ENCOUNTER — Inpatient Hospital Stay: Payer: Medicare PPO

## 2021-11-19 ENCOUNTER — Inpatient Hospital Stay: Payer: Medicare PPO | Attending: Internal Medicine | Admitting: Internal Medicine

## 2021-11-19 ENCOUNTER — Other Ambulatory Visit: Payer: Self-pay | Admitting: Internal Medicine

## 2021-11-19 VITALS — BP 156/64 | HR 85 | Temp 97.9°F | Resp 17

## 2021-11-19 VITALS — BP 143/65 | HR 93 | Temp 98.7°F | Ht 63.0 in | Wt 131.6 lb

## 2021-11-19 DIAGNOSIS — D6481 Anemia due to antineoplastic chemotherapy: Secondary | ICD-10-CM | POA: Insufficient documentation

## 2021-11-19 DIAGNOSIS — R7989 Other specified abnormal findings of blood chemistry: Secondary | ICD-10-CM | POA: Diagnosis not present

## 2021-11-19 DIAGNOSIS — Z5112 Encounter for antineoplastic immunotherapy: Secondary | ICD-10-CM | POA: Diagnosis not present

## 2021-11-19 DIAGNOSIS — C3411 Malignant neoplasm of upper lobe, right bronchus or lung: Secondary | ICD-10-CM | POA: Insufficient documentation

## 2021-11-19 DIAGNOSIS — C154 Malignant neoplasm of middle third of esophagus: Secondary | ICD-10-CM | POA: Diagnosis not present

## 2021-11-19 DIAGNOSIS — D490 Neoplasm of unspecified behavior of digestive system: Secondary | ICD-10-CM

## 2021-11-19 DIAGNOSIS — Z79899 Other long term (current) drug therapy: Secondary | ICD-10-CM | POA: Diagnosis not present

## 2021-11-19 DIAGNOSIS — R531 Weakness: Secondary | ICD-10-CM

## 2021-11-19 LAB — CBC WITH DIFFERENTIAL/PLATELET
Abs Immature Granulocytes: 0.02 10*3/uL (ref 0.00–0.07)
Basophils Absolute: 0 10*3/uL (ref 0.0–0.1)
Basophils Relative: 1 %
Eosinophils Absolute: 0.2 10*3/uL (ref 0.0–0.5)
Eosinophils Relative: 2 %
HCT: 31.4 % — ABNORMAL LOW (ref 36.0–46.0)
Hemoglobin: 10.6 g/dL — ABNORMAL LOW (ref 12.0–15.0)
Immature Granulocytes: 0 %
Lymphocytes Relative: 17 %
Lymphs Abs: 1.1 10*3/uL (ref 0.7–4.0)
MCH: 29.4 pg (ref 26.0–34.0)
MCHC: 33.8 g/dL (ref 30.0–36.0)
MCV: 87.2 fL (ref 80.0–100.0)
Monocytes Absolute: 0.6 10*3/uL (ref 0.1–1.0)
Monocytes Relative: 9 %
Neutro Abs: 4.7 10*3/uL (ref 1.7–7.7)
Neutrophils Relative %: 71 %
Platelets: 269 10*3/uL (ref 150–400)
RBC: 3.6 MIL/uL — ABNORMAL LOW (ref 3.87–5.11)
RDW: 12.4 % (ref 11.5–15.5)
WBC: 6.6 10*3/uL (ref 4.0–10.5)
nRBC: 0 % (ref 0.0–0.2)

## 2021-11-19 LAB — COMPREHENSIVE METABOLIC PANEL
ALT: 15 U/L (ref 0–44)
AST: 22 U/L (ref 15–41)
Albumin: 3.6 g/dL (ref 3.5–5.0)
Alkaline Phosphatase: 71 U/L (ref 38–126)
Anion gap: 8 (ref 5–15)
BUN: 15 mg/dL (ref 8–23)
CO2: 28 mmol/L (ref 22–32)
Calcium: 9.6 mg/dL (ref 8.9–10.3)
Chloride: 99 mmol/L (ref 98–111)
Creatinine, Ser: 0.83 mg/dL (ref 0.44–1.00)
GFR, Estimated: >60 mL/min (ref >60)
Glucose, Bld: 126 mg/dL — ABNORMAL HIGH (ref 70–99)
Potassium: 3.3 mmol/L — ABNORMAL LOW (ref 3.5–5.1)
Sodium: 135 mmol/L (ref 135–145)
Total Bilirubin: 0.5 mg/dL (ref 0.3–1.2)
Total Protein: 7.7 g/dL (ref 6.5–8.1)

## 2021-11-19 LAB — TSH: TSH: 0.138 u[IU]/mL — ABNORMAL LOW (ref 0.350–4.500)

## 2021-11-19 MED ORDER — SODIUM CHLORIDE 0.9 % IV SOLN
Freq: Once | INTRAVENOUS | Status: AC
  Filled 2021-11-19: qty 250

## 2021-11-19 MED ORDER — ALBUTEROL SULFATE HFA 108 (90 BASE) MCG/ACT IN AERS: 2.0000 | INHALATION_SPRAY | Freq: Four times a day (QID) | RESPIRATORY_TRACT | 2 refills | Status: DC | PRN

## 2021-11-19 MED ORDER — SODIUM CHLORIDE 0.9 % IV SOLN
240.0000 mg | Freq: Once | INTRAVENOUS | Status: AC
  Administered 2021-11-19: 240 mg via INTRAVENOUS
  Filled 2021-11-19: qty 24

## 2021-11-19 NOTE — Progress Notes (Signed)
C/o a cough x 2 months.

## 2021-11-19 NOTE — Patient Instructions (Signed)
[facility] AT [place]  Discharge Instructions: Thank you for choosing [place] to provide your oncology and hematology care.  If you have a lab appointment with the [place], please go directly to the [place] and check in at the registration area.  Wear comfortable clothing and clothing appropriate for easy access to any Portacath or PICC line.   We strive to give you quality time with your provider. You may need to reschedule your appointment if you arrive late (15 or more minutes).  Arriving late affects you and other patients whose appointments are after yours.  Also, if you miss three or more appointments without notifying the office, you may be dismissed from the clinic at the provider's discretion.      For prescription refill requests, have your pharmacy contact our office and allow 72 hours for refills to be completed.    Today you received the following chemotherapy and/or immunotherapy agents Opdivo.      To help prevent nausea and vomiting after your treatment, we encourage you to take your nausea medication as directed.  BELOW ARE SYMPTOMS THAT SHOULD BE REPORTED IMMEDIATELY: *FEVER GREATER THAN 100.4 F (38 C) OR HIGHER *CHILLS OR SWEATING *NAUSEA AND VOMITING THAT IS NOT CONTROLLED WITH YOUR NAUSEA MEDICATION *UNUSUAL SHORTNESS OF BREATH *UNUSUAL BRUISING OR BLEEDING *URINARY PROBLEMS (pain or burning when urinating, or frequent urination) *BOWEL PROBLEMS (unusual diarrhea, constipation, pain near the anus) TENDERNESS IN MOUTH AND THROAT WITH OR WITHOUT PRESENCE OF ULCERS (sore throat, sores in mouth, or a toothache) UNUSUAL RASH, SWELLING OR PAIN  UNUSUAL VAGINAL DISCHARGE OR ITCHING   Items with * indicate a potential emergency and should be followed up as soon as possible or go to the Emergency Department if any problems should occur.  Please show the CHEMOTHERAPY ALERT CARD or IMMUNOTHERAPY ALERT CARD at check-in to the  Emergency Department and triage nurse.  Should you have questions after your visit or need to cancel or reschedule your appointment, please contact [facility] [place] AT [place]  [phone] and follow the prompts.  Office hours are 8:00 a.m. to 4:30 p.m. Monday - Friday. Please note that voicemails left after 4:00 p.m. may not be returned until the following business day.  We are closed weekends and major holidays. You have access to a nurse at all times for urgent questions. Please call the main number to the clinic [phone] and follow the prompts.  For any non-urgent questions, you may also contact your provider using MyChart. We now offer e-Visits for anyone 31 and older to request care online for non-urgent symptoms. For details visit [email or web address].   Also download the MyChart app! Go to the app store, search "MyChart", open the app, select [place], and log in with your MyChart username and password.  Due to Covid, a mask is required upon entering the hospital/clinic. If you do not have a mask, one will be given to you upon arrival. For doctor visits, patients may have 1 support person aged 62 or older with them. For treatment visits, patients cannot have anyone with them due to current Covid guidelines and our immunocompromised population.

## 2021-11-19 NOTE — Assessment & Plan Note (Addendum)
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  APRIL 16th, 2023- PET scan-  Resolution of metabolic activity in the mid esophagus;  Persistent hypermetabolic activity within a subcarinal lymph node. MAY 18th, 2023- Status post endoscopic ultrasound/biopsy of the mediastinal/subcarinal lymph node- BIOPSY POSITIVE FOR RECURRENT SQUAMOUS CELL.  # Labs today reviewed;  acceptable for treatment today.   I again reviewed  discussed the mechanism of action; The goal of therapy is palliative; and length of treatments are likely ongoing/based upon the results of the scans. Discussed the potential side effects of immunotherapy including but not limited to diarrhea; skin rash; elevated LFTs/endocrine abnormalities etc.  # Cough-? Sec to reflux; continue allergy medications/PPI; remote hx of smoking- recommend Albuterol prn.     # Mild  Anemia- improved- Hb 11-12- sec to chemo-STABLE.  # FBG- 126- recommend cutting down carbs- monitor for now. [glucerna protein shakes]; Improved.   #Debility/risk of falls s/p evaluation with aureen; on PT-physical therapy- STABLE.   # Weight loss; poor appetite- refer to [place].   # DISPOSITION:  # OPDIVO today- # lab today again- ordered # follow up in 2 weeks- MD; labs- cbc/cmp;OPDIVO- Dr.B  Cc; Dr.Scott

## 2021-11-19 NOTE — Progress Notes (Unsigned)
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  Starting radiation and chemotherapy from April 14, 2014 Patient was started on carboplatinum andTaxol Herve were developed an allergic reaction to Taxol so would be changed to Abraxane 3.patient has finished 6 cycles of weekly chemotherapy with carboplatinum  and radiation therapy(May 28, 2014) 4.started on  NIVOLULAMAB because of persistent disease July 02, 2014. 5.  NIVOLULAMAB was discontinued because of persistent diarrhea in July of 2016.  August of 2016 CT scan was stable so no further chemotherapy  # AUG 25th PET- STABLE RUL MASS [radiation fibrosis; <1cm ? 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   APRIL 16th, 2023- PET scan-  Resolution of metabolic activity in the mid esophagus;  Persistent hypermetabolic activity within a subcarinal lymph node. Differential includes residual carcinoma versus reactive adenopathy.  No evidence of distant metastatic disease.  # MAY 18th, 2023- s/p EUS  [Dr.Spaete]-endoscopic biopsy of the mediastinal lymph node. MAY 18th, 2023- Status post endoscopic ultrasound/biopsy of the mediastinal/subcarinal lymph node- BIOPSY POSITIVE FOR RECURRENT SQUAMOUS CELL.  # JUNE 9th, 2023- Opdivo every 2 weeks.   # AAA/ 3.3x 3.9 Stable [Dr.Schnier] -----------------------------------------------------       History of lung cancer  Malignant neoplasm of right upper lobe of lung ([place])  08/07/2019 Initial Diagnosis   Malignant neoplasm of right upper lobe of lung ([place])    INTERVAL HISTORY: Ambulating with a rolling walker.  Accompanied by her daughter.  Heather Cooper 88 y.o.  female pleasant patient newly diagnosed squamous cell carcinoma of the midesophagus-locally advanced on definitive [carbo-abraxane]chemoradiation -with concern for persistent/recurrent disease in the mediastinum is here for follow-up after endoscopic ultrasound to review the biopsy.  Patient appetite is improving.  Denies any weight loss.  Notes have improvement of her difficulty swallowing.  Has chronic mild cough but any worse.  Continues to work with physical therapy.   Review of Systems  Constitutional:  Positive for malaise/fatigue. Negative for chills, diaphoresis, fever and weight loss.  HENT:  Negative for nosebleeds and sore throat.   Eyes:  Negative for double vision.  Respiratory:  Negative for hemoptysis, sputum production, shortness of breath and wheezing.   Cardiovascular:  Negative for chest pain, palpitations, orthopnea and leg swelling.  Gastrointestinal:  Negative for abdominal pain, blood in stool, constipation, diarrhea, heartburn, melena, nausea and vomiting.  Genitourinary:  Negative for dysuria, frequency and urgency.  Musculoskeletal:  Negative for back pain and joint pain.  Skin: Negative.  Negative for itching and rash.  Neurological:  Negative for dizziness, tingling, focal weakness, weakness and headaches.  Endo/Heme/Allergies:  Does not  bruise/bleed  easily.  Psychiatric/Behavioral:  Negative for depression. The patient is not nervous/anxious and does not have insomnia.      PAST MEDICAL HISTORY :  Past Medical History:  Diagnosis Date   Chemotherapy induced nausea and vomiting    Chicken pox    Esophageal cancer (HCC)    Hypercalcemia    familial hypocalciuric hypercalcemia   Hypercholesterolemia    Hypertension    Lung cancer ([place])    Osteoporosis    Thyroid disease     PAST SURGICAL HISTORY :   Past Surgical History:  Procedure Laterality Date   ABDOMINAL HYSTERECTOMY     partial   CHOLECYSTECTOMY     COLONOSCOPY WITH PROPOFOL N/A 04/17/2017   Procedure: COLONOSCOPY WITH PROPOFOL;  Surgeon: Manya Silvas, MD;  Location: [facility] ENDOSCOPY;  Service: Endoscopy;  Laterality: N/A;   ESOPHAGOGASTRODUODENOSCOPY (EGD) WITH PROPOFOL N/A 04/13/2021   Procedure: ESOPHAGOGASTRODUODENOSCOPY (EGD) WITH PROPOFOL;  Surgeon: [name], MD;  Location: [facility] ENDOSCOPY;  Service: Endoscopy;  Laterality: N/A;   EUS N/A 04/22/2021   Procedure: FULL UPPER ENDOSCOPIC ULTRASOUND (EUS) RADIAL;  Surgeon: Jola Schmidt, MD;  Location: [facility] ENDOSCOPY;  Service: Endoscopy;  Laterality: N/A;  [facility] needed   EUS N/A 10/28/2021   Procedure: UPPER ENDOSCOPIC ULTRASOUND (EUS) LINEAR;  Surgeon: Reita Cliche, MD;  Location: [facility] ENDOSCOPY;  Service: Gastroenterology;  Laterality: N/A;  [facility]   transvaginal hysterectomy  04/18/05   with anterior colporrhaphy    FAMILY HISTORY :   Family History  Problem Relation Age of Onset   Stroke Mother    Hypertension Mother    Prostate cancer Father    Cancer Father        prostate   Heart disease Brother        s/p CABG   Colon cancer Neg Hx     SOCIAL HISTORY:   Social History   Tobacco Use   Smoking status: Former    Types: Cigarettes    Quit date: 06/13/1997    Years since quitting: 24.4   Smokeless tobacco: Never  Vaping Use   Vaping Use: Never used  Substance Use Topics   Alcohol  use: No    Alcohol/week: 0.0 standard drinks of alcohol   Drug use: No    ALLERGIES:  is allergic to paclitaxel.  MEDICATIONS:  Current Outpatient Medications  Medication Sig Dispense Refill   albuterol (VENTOLIN HFA) 108 (90 Base) MCG/ACT inhaler Inhale 2 puffs into the lungs every 6 (six) hours as needed for wheezing or shortness of breath. 8 g 2   cholecalciferol (VITAMIN D3) 25 MCG (1000 UNIT) tablet Take 1,000 Units by mouth daily.     diphenoxylate-atropine (LOMOTIL) 2.5-0.025 MG tablet Take 1 tablet by mouth 4 (four) times daily as needed for diarrhea or loose stools. Take it along with immodium 30 tablet 0   fluticasone (FLONASE) 50 MCG/ACT nasal spray Place 2 sprays into both nostrils daily. 16 g 3   levocetirizine (XYZAL) 5 MG tablet Take 1 tablet (5 mg total) by mouth every evening. 90 tablet 2   pantoprazole (PROTONIX) 40 MG tablet TAKE 1 TABLET BY MOUTH ONCE DAILY 90 tablet 3   olmesartan-hydrochlorothiazide (BENICAR HCT) 20-12.5 MG tablet TAKE 1 TABLET BY MOUTH ONCE DAILY 90 tablet 1   No current facility-administered medications for this visit.   Facility-Administered Medications Ordered in Other Visits  Medication Dose Route Frequency Provider Last Rate Last Admin   sodium chloride 0.9 % 1,000 mL with potassium  chloride 20 mEq, magnesium sulfate 2 g infusion   Intravenous Continuous Forest Gleason, MD   Stopped at 12/04/14 1300    PHYSICAL EXAMINATION: ECOG PERFORMANCE STATUS: 0 - Asymptomatic  BP (!) 143/65 (BP Location: Left Arm, Patient Position: Sitting, Cuff Size: Normal)   Pulse 93   Temp 98.7 F (37.1 C) (Tympanic)   Ht 5' 3"  (1.6 m)   Wt 131 lb 9.6 oz (59.7 kg)   SpO2 100%   BMI 23.31 kg/m   Filed Weights   11/19/21 1053  Weight: 131 lb 9.6 oz (59.7 kg)     Physical Exam HENT:     Head: Normocephalic and atraumatic.     Mouth/Throat:     Pharynx: No oropharyngeal exudate.  Eyes:     Pupils: Pupils are equal, round, and reactive to light.   Cardiovascular:     Rate and Rhythm: Normal rate and regular rhythm.  Pulmonary:     Effort: Pulmonary effort is normal. No respiratory distress.     Breath sounds: Normal breath sounds. No wheezing.  Abdominal:     General: Bowel sounds are normal. There is no distension.     Palpations: Abdomen is soft. There is no mass.     Tenderness: There is no abdominal tenderness. There is no guarding or rebound.  Musculoskeletal:        General: No tenderness. Normal range of motion.     Cervical back: Normal range of motion and neck supple.  Skin:    General: Skin is warm.  Neurological:     Mental Status: She is alert and oriented to person, place, and time.  Psychiatric:        Mood and Affect: Affect normal.      LABORATORY DATA:  I have reviewed the data as listed    Component Value Date/Time   NA 135 11/19/2021 1023   NA 130 (L) 10/09/2014 0846   K 3.3 (L) 11/19/2021 1023   K 4.1 10/09/2014 0846   CL 99 11/19/2021 1023   CL 98 (L) 10/09/2014 0846   CO2 28 11/19/2021 1023   CO2 25 10/09/2014 0846   GLUCOSE 126 (H) 11/19/2021 1023   GLUCOSE 161 (H) 10/09/2014 0846   BUN 15 11/19/2021 1023   BUN 14 10/09/2014 0846   CREATININE 0.83 11/19/2021 1023   CREATININE 0.70 10/09/2014 0846   CALCIUM 9.6 11/19/2021 1023   CALCIUM 9.1 10/09/2014 0846   PROT 7.7 11/19/2021 1023   PROT 7.3 10/09/2014 0846   ALBUMIN 3.6 11/19/2021 1023   ALBUMIN 3.6 10/09/2014 0846   AST 22 11/19/2021 1023   AST 16 10/09/2014 0846   ALT 15 11/19/2021 1023   ALT 13 (L) 10/09/2014 0846   ALKPHOS 71 11/19/2021 1023   ALKPHOS 70 10/09/2014 0846   BILITOT 0.5 11/19/2021 1023   BILITOT 1.0 10/09/2014 0846   GFRNONAA >60 11/19/2021 1023   GFRNONAA >60 10/09/2014 0846   GFRAA >60 02/05/2020 1004   GFRAA >60 10/09/2014 0846    No results found for: "SPEP", "UPEP"  Lab Results  Component Value Date   WBC 6.6 11/19/2021   NEUTROABS 4.7 11/19/2021   HGB 10.6 (L) 11/19/2021   HCT 31.4 (L)  11/19/2021   MCV 87.2 11/19/2021   PLT 269 11/19/2021      Chemistry      Component Value Date/Time   NA 135 11/19/2021 1023   NA 130 (L) 10/09/2014 0846   K 3.3 (L) 11/19/2021 1023   K  4.1 10/09/2014 0846   CL 99 11/19/2021 1023   CL 98 (L) 10/09/2014 0846   CO2 28 11/19/2021 1023   CO2 25 10/09/2014 0846   BUN 15 11/19/2021 1023   BUN 14 10/09/2014 0846   CREATININE 0.83 11/19/2021 1023   CREATININE 0.70 10/09/2014 0846   GLU 111 03/18/2014 1048      Component Value Date/Time   CALCIUM 9.6 11/19/2021 1023   CALCIUM 9.1 10/09/2014 0846   ALKPHOS 71 11/19/2021 1023   ALKPHOS 70 10/09/2014 0846   AST 22 11/19/2021 1023   AST 16 10/09/2014 0846   ALT 15 11/19/2021 1023   ALT 13 (L) 10/09/2014 0846   BILITOT 0.5 11/19/2021 1023   BILITOT 1.0 10/09/2014 0846       RADIOGRAPHIC STUDIES: I have personally reviewed the radiological images as listed and agreed with the findings in the report. No results found.    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  APRIL 16th, 2023- PET scan-  Resolution of metabolic activity in the mid esophagus; however persistent hypermetabolic activity within a subcarinal lymph node. MAY 18th, 2023- Status post endoscopic ultrasound/biopsy of the mediastinal/subcarinal lymph node- BIOPSY POSITIVE FOR RECURRENT SQUAMOUS CELL.  #Proceed with Opdivo every 2 weeks.  Labs today reviewed;  acceptable for treatment today.  Discussed that would recommend at least 2 to 3 months of therapy followed by reimaging.   I again reviewed  discussed the mechanism of action; The goal of therapy is palliative; and length of treatments are likely ongoing/based upon the results of the scans. Discussed the potential side effects of immunotherapy including but not limited to diarrhea; skin rash; elevated  LFTs/endocrine abnormalities etc.  # Cough-? Sec to reflux; continue allergy medications/PPI; remote hx of smoking- recommend Albuterol prn.     # Mild  Anemia- improved- Hb 11-12- sec to chemo-STABLE.  # FBG- 126- recommend cutting down carbs- monitor for now. [glucerna protein shakes]; Improved.   #Debility/risk of falls s/p evaluation with Gwenette Greet; on PT-physical therapy- STABLE.   #TSH low-weight loss; poor appetite- refer to [place].  Repeat thyroid labs.-Discussed with Dr. Nicki Reaper.  # DISPOSITION:  # OPDIVO today- # lab today again- ordered # follow up in 2 weeks- MD; labs- cbc/cmp;OPDIVO- Dr.B  Addendum: Repeat TSH low however T4 normal.  However patient seem to be symptomatic with weight loss fatigue.  Defer to Dr. Nicki Reaper for further recommendations.  Forwarded the results to Dr. Nicki Reaper.    Cc; Dr.Scott      Orders Placed This Encounter  Procedures   Thyroid Panel With TSH    Standing Status:   Future    Number of Occurrences:   1    Standing Expiration Date:   11/19/2022   Thyroid Peroxidase Antibody   Thyroglobulin antibody    Standing Status:   Future    Number of Occurrences:   1    Standing Expiration Date:   11/20/2022    All questions were answered. The patient knows to call the clinic with any problems, questions or concerns.      Cammie Sickle, MD 11/21/2021 11:31 PM

## 2021-11-20 LAB — THYROID PANEL WITH TSH
Free Thyroxine Index: 2.7 (ref 1.2–4.9)
T3 Uptake Ratio: 29 % (ref 24–39)
T4, Total: 9.2 ug/dL (ref 4.5–12.0)
TSH: 0.12 u[IU]/mL — ABNORMAL LOW (ref 0.450–4.500)

## 2021-11-20 LAB — THYROID PEROXIDASE ANTIBODY: Thyroperoxidase Ab SerPl-aCnc: <9 IU/mL (ref 0–34)

## 2021-11-20 LAB — THYROGLOBULIN ANTIBODY: Thyroglobulin Antibody: <1 IU/mL (ref 0.0–0.9)

## 2021-11-21 ENCOUNTER — Encounter: Payer: Self-pay | Admitting: Internal Medicine

## 2021-11-23 ENCOUNTER — Encounter: Payer: Self-pay | Admitting: Internal Medicine

## 2021-11-23 ENCOUNTER — Ambulatory Visit (INDEPENDENT_AMBULATORY_CARE_PROVIDER_SITE_OTHER): Payer: Medicare PPO | Admitting: Internal Medicine

## 2021-11-23 DIAGNOSIS — C159 Malignant neoplasm of esophagus, unspecified: Secondary | ICD-10-CM | POA: Diagnosis not present

## 2021-11-23 DIAGNOSIS — J439 Emphysema, unspecified: Secondary | ICD-10-CM | POA: Diagnosis not present

## 2021-11-23 DIAGNOSIS — I714 Abdominal aortic aneurysm, without rupture, unspecified: Secondary | ICD-10-CM | POA: Diagnosis not present

## 2021-11-23 DIAGNOSIS — E1165 Type 2 diabetes mellitus with hyperglycemia: Secondary | ICD-10-CM | POA: Diagnosis not present

## 2021-11-23 DIAGNOSIS — R946 Abnormal results of thyroid function studies: Secondary | ICD-10-CM

## 2021-11-23 DIAGNOSIS — R7989 Other specified abnormal findings of blood chemistry: Secondary | ICD-10-CM

## 2021-11-23 DIAGNOSIS — E7800 Pure hypercholesterolemia, unspecified: Secondary | ICD-10-CM

## 2021-11-23 DIAGNOSIS — I700 Atherosclerosis of aorta: Secondary | ICD-10-CM

## 2021-11-23 DIAGNOSIS — K219 Gastro-esophageal reflux disease without esophagitis: Secondary | ICD-10-CM

## 2021-11-23 DIAGNOSIS — I10 Essential (primary) hypertension: Secondary | ICD-10-CM

## 2021-11-23 NOTE — Progress Notes (Signed)
Patient ID: Heather Cooper, female   DOB: 10/20/1930, 88 y.o.   MRN: [id number]   Subjective:    Patient ID: Heather Cooper, female    DOB: Mar 22, 1931, 88 y.o.   MRN: [id number]   Patient here for work in appt.     HPI Work in to discuss recent thyroid labs.  Seeing oncology for treated of squamous cell carcinoma of mid esophagus.  Receiving chemo.  Has had some issues with decreased appetite and weight loss.  Appears to be doing some better now.  Drinking ensure. Does report some cough.  No chest pain.  Breathing stable.  Taking something for allergies.  Uses inhaler prn.  No vomiting.  No abdominal pain.  Recent TSH suppressed.     Past Medical History:  Diagnosis Date   Chemotherapy induced nausea and vomiting    Chicken pox    Esophageal cancer (HCC)    Hypercalcemia    familial hypocalciuric hypercalcemia   Hypercholesterolemia    Hypertension    Lung cancer ([place])    Osteoporosis    Thyroid disease    Past Surgical History:  Procedure Laterality Date   ABDOMINAL HYSTERECTOMY     partial   CHOLECYSTECTOMY     COLONOSCOPY WITH PROPOFOL N/A 04/17/2017   Procedure: COLONOSCOPY WITH PROPOFOL;  Surgeon: Manya Silvas, MD;  Location[facility] ENDOSCOPY;  Service: Endoscopy;  Laterality: N/A;   ESOPHAGOGASTRODUODENOSCOPY (EGD) WITH PROPOFOL N/A 04/13/2021   Procedure: ESOPHAGOGASTRODUODENOSCOPY (EGD) WITH PROPOFOL;  Surgeon: [name], MD;  Location: [facility] ENDOSCOPY;  Service: Endoscopy;  Laterality: N/A;   EUS N/A 04/22/2021   Procedure: FULL UPPER ENDOSCOPIC ULTRASOUND (EUS) RADIAL;  Surgeon: Jola Schmidt, MD;  Location: [facility] ENDOSCOPY;  Service: Endoscopy;  Laterality: N/A;  [facility] needed   EUS N/A 10/28/2021   Procedure: UPPER ENDOSCOPIC ULTRASOUND (EUS) LINEAR;  Surgeon: Reita Cliche, MD;  Location: [facility] ENDOSCOPY;  Service: Gastroenterology;  Laterality: N/A;  [facility]   transvaginal hysterectomy  04/18/05   with anterior colporrhaphy   Family History  Problem  Relation Age of Onset   Stroke Mother    Hypertension Mother    Prostate cancer Father    Cancer Father        prostate   Heart disease Brother        s/p CABG   Colon cancer Neg Hx    Social History   Socioeconomic History   Marital status: Widowed    Spouse name: Not on file   Number of children: 3   Years of education: Not on file   Highest education level: Not on file  Occupational History   Not on file  Tobacco Use   Smoking status: Former    Types: Cigarettes    Quit date: 06/13/1997    Years since quitting: 24.4   Smokeless tobacco: Never  Vaping Use   Vaping Use: Never used  Substance and Sexual Activity   Alcohol use: No    Alcohol/week: 0.0 standard drinks of alcohol   Drug use: No   Sexual activity: Not Currently  Other Topics Concern   Not on file  Social History Narrative   Lost her husband November 2019   Social Determinants of Health   Financial Resource Strain: Low Risk  (01/25/2021)   Overall Financial Resource Strain (CARDIA)    Difficulty of Paying Living Expenses: Not hard at all  Food Insecurity: No Food Insecurity (01/25/2021)   Hunger Vital Sign    Worried About Running Out  of Food in the Last Year: Never true    [place] in the Last Year: Never true  Transportation Needs: No Transportation Needs (01/25/2021)   PRAPARE - Hydrologist (Medical): No    Lack of Transportation (Non-Medical): No  Physical Activity: Unknown (01/25/2021)   Exercise Vital Sign    Days of Exercise per Week: 0 days    Minutes of Exercise per Session: Not on file  Stress: No Stress Concern Present (01/25/2021)   [place]    Feeling of Stress : Not at all  Social Connections: Moderately Integrated (01/25/2021)   Social Connection and Isolation Panel [NHANES]    Frequency of Communication with Friends and Family: More than three times a week    Frequency of Social  Gatherings with Friends and Family: More than three times a week    Attends Religious Services: More than 4 times per year    Active Member of [employer] or Organizations: Yes    Attends Archivist Meetings: More than 4 times per year    Marital Status: Widowed     Review of Systems  Constitutional:  Negative for fever.       Reports appetite is improving.  Drinking ensure.   HENT:  Negative for congestion and sinus pressure.   Respiratory:  Positive for cough. Negative for chest tightness.        Breathing stable.   Cardiovascular:  Negative for chest pain, palpitations and leg swelling.  Gastrointestinal:  Negative for abdominal pain, diarrhea, nausea and vomiting.  Genitourinary:  Negative for difficulty urinating and dysuria.  Musculoskeletal:  Negative for joint swelling and myalgias.  Skin:  Negative for color change and rash.  Neurological:  Negative for dizziness, light-headedness and headaches.  Psychiatric/Behavioral:  Negative for agitation and dysphoric mood.        Objective:     BP 130/70 (BP Location: Left Arm, Patient Position: Sitting, Cuff Size: Small)   Pulse 98   Temp 97.9 F (36.6 C) (Temporal)   Resp 17   Ht 5' 3"  (1.6 m)   Wt 130 lb 9.6 oz (59.2 kg)   SpO2 97%   BMI 23.13 kg/m  Wt Readings from Last 3 Encounters:  11/23/21 130 lb 9.6 oz (59.2 kg)  11/19/21 131 lb 9.6 oz (59.7 kg)  11/01/21 133 lb 9.6 oz (60.6 kg)    Physical Exam Vitals reviewed.  Constitutional:      General: She is not in acute distress.    Appearance: Normal appearance.  HENT:     Head: Normocephalic and atraumatic.     Right Ear: External ear normal.     Left Ear: External ear normal.  Eyes:     General: No scleral icterus.       Right eye: No discharge.        Left eye: No discharge.     Conjunctiva/sclera: Conjunctivae normal.  Neck:     Thyroid: No thyromegaly.  Cardiovascular:     Rate and Rhythm: Normal rate and regular rhythm.  Pulmonary:     Effort:  No respiratory distress.     Breath sounds: Normal breath sounds. No wheezing.  Abdominal:     General: Bowel sounds are normal.     Palpations: Abdomen is soft.     Tenderness: There is no abdominal tenderness.  Musculoskeletal:        General: No swelling or tenderness.  Cervical back: Neck supple. No tenderness.  Lymphadenopathy:     Cervical: No cervical adenopathy.  Skin:    Findings: No erythema or rash.  Neurological:     Mental Status: She is alert.  Psychiatric:        Mood and Affect: Mood normal.        Behavior: Behavior normal.      Outpatient Encounter Medications as of 11/23/2021  Medication Sig   albuterol (VENTOLIN HFA) 108 (90 Base) MCG/ACT inhaler Inhale 2 puffs into the lungs every 6 (six) hours as needed for wheezing or shortness of breath.   cholecalciferol (VITAMIN D3) 25 MCG (1000 UNIT) tablet Take 1,000 Units by mouth daily.   diphenoxylate-atropine (LOMOTIL) 2.5-0.025 MG tablet Take 1 tablet by mouth 4 (four) times daily as needed for diarrhea or loose stools. Take it along with immodium   fluticasone (FLONASE) 50 MCG/ACT nasal spray Place 2 sprays into both nostrils daily.   levocetirizine (XYZAL) 5 MG tablet Take 1 tablet (5 mg total) by mouth every evening.   olmesartan-hydrochlorothiazide (BENICAR HCT) 20-12.5 MG tablet TAKE 1 TABLET BY MOUTH ONCE DAILY   pantoprazole (PROTONIX) 40 MG tablet TAKE 1 TABLET BY MOUTH ONCE DAILY   [DISCONTINUED] prochlorperazine (COMPAZINE) 10 MG tablet Take 1 tablet (10 mg total) by mouth every 6 (six) hours as needed (Nausea or vomiting). (Patient not taking: Reported on 09/29/2021)   Facility-Administered Encounter Medications as of 11/23/2021  Medication   sodium chloride 0.9 % 1,000 mL with potassium chloride 20 mEq, magnesium sulfate 2 g infusion     Lab Results  Component Value Date   WBC 6.6 11/19/2021   HGB 10.6 (L) 11/19/2021   HCT 31.4 (L) 11/19/2021   PLT 269 11/19/2021   GLUCOSE 126 (H) 11/19/2021    CHOL 171 08/26/2021   TRIG 182.0 (H) 08/26/2021   HDL 39.10 08/26/2021   LDLCALC 96 08/26/2021   ALT 15 11/19/2021   AST 22 11/19/2021   NA 135 11/19/2021   K 3.3 (L) 11/19/2021   CL 99 11/19/2021   CREATININE 0.83 11/19/2021   BUN 15 11/19/2021   CO2 28 11/19/2021   TSH 0.16 (L) 11/23/2021   INR 1.0 03/18/2014   INR 1.0 03/18/2014   HGBA1C 5.5 08/26/2021   MICROALBUR 5.2 (H) 01/19/2021       Assessment & Plan:   Problem List Items Addressed This Visit     Abdominal aortic aneurysm ([place])    Followed by Dr Delana Meyer (08/06/20)  Stable.  Recommended f/u in 12 months.  Discussed.  She has been in contact with [facility].  Will hold until current treatments complete and then plans f/u.       Aortic atherosclerosis (HCC)    Not on cholesterol medication.  Continue blood pressure control.       Emphysema of lung ([place])    Has rescue inhaler to use if needed.  Delsym to help with cough.  Breathing overall stable.  Follow. Notify me if symptoms worsen/persist      Esophageal cancer [facility])    Being followed by radiation oncology and oncology.  Eating.  Receiving chemo and XRT.  Follow       GERD (gastroesophageal reflux disease)    Continues on protonix.  No acid reflux reported.       Hypercholesterolemia    Follow lipid panel.       Hypertension    Continue benicar/hctz.  Blood pressure has been doing well.   Follow pressures.  Follow metabolic panel.       Low TSH level    Discussed today.  Will recheck TSH and check Free T3 and Free T4.  Follow.        Relevant Orders   TSH (Completed)   T4, free (Completed)   T3, free (Completed)   Type 2 diabetes mellitus with hyperglycemia ([place])    Hold on medication.  Follow met b and a1c.         [name], MD

## 2021-11-24 LAB — TSH: TSH: 0.16 u[IU]/mL — ABNORMAL LOW (ref 0.35–5.50)

## 2021-11-24 LAB — T3, FREE: T3, Free: 3.8 pg/mL (ref 2.3–4.2)

## 2021-11-24 LAB — T4, FREE: Free T4: 1.25 ng/dL (ref 0.60–1.60)

## 2021-11-25 ENCOUNTER — Telehealth: Payer: Self-pay | Admitting: Internal Medicine

## 2021-11-25 NOTE — Telephone Encounter (Signed)
Notify Ms Dearmas that her TSH is suppressed, but given the level is .1 AND THE fREE T3 AND fREE t4 IS WNL.  no further treatment recommended at this time.  we will follow.

## 2021-11-25 NOTE — Telephone Encounter (Signed)
Pt would like to be called regarding her lab results

## 2021-11-25 NOTE — Telephone Encounter (Signed)
Pt advised.

## 2021-11-27 ENCOUNTER — Telehealth: Payer: Self-pay | Admitting: Internal Medicine

## 2021-11-27 NOTE — Telephone Encounter (Signed)
-----   Message from Elayne Snare, MD sent at 11/25/2021  1:14 PM EDT ----- Regarding: RE: question Unless the TSH is totally suppressed below 0.1 I would not consider treatment especially with T4 and T3 normal ----- Message ----- From: [name], MD Sent: 11/25/2021   5:31 AM EDT To: Elayne Snare, MD Subject: question                                       Sorry to bother you again.  Wanted to ask you a question.  Ms Tiedt has recently been diagnosed with squamous cell carcinoma of the midesophagus and is undergoing chemo treatments.  Her oncologist messaged me concerned regarding her fatigue and weight loss and suppressed TSH.  I saw her for a work in appt to discuss.  She is eating some.  Appetite may be some better.  I repeated her labs and TSH is suppressed, but her free T3 and Free T4 are wnl.  I was just going to monitor, but I wanted to see if you recommend any further intervention/treatment given she is "symptomatic" as noted above.  Thank you for your help.  I really appreciate it.    Thank you  Randell Patient

## 2021-12-01 ENCOUNTER — Encounter: Payer: Self-pay | Admitting: Internal Medicine

## 2021-12-01 NOTE — Assessment & Plan Note (Signed)
Follow lipid panel.   

## 2021-12-01 NOTE — Assessment & Plan Note (Signed)
Discussed today.  Will recheck TSH and check Free T3 and Free T4.  Follow.

## 2021-12-01 NOTE — Assessment & Plan Note (Signed)
Continues on protonix.  No acid reflux reported.

## 2021-12-01 NOTE — Assessment & Plan Note (Signed)
Followed by Dr Delana Meyer (08/06/20)  Stable.  Recommended f/u in 12 months.  Discussed.  She has been in contact with [facility].  Will hold until current treatments complete and then plans f/u.

## 2021-12-01 NOTE — Assessment & Plan Note (Signed)
Continue benicar/hctz.  Blood pressure has been doing well.   Follow pressures.  Follow metabolic panel.

## 2021-12-01 NOTE — Assessment & Plan Note (Signed)
Not on cholesterol medication.  Continue blood pressure control.

## 2021-12-01 NOTE — Assessment & Plan Note (Signed)
Has rescue inhaler to use if needed.  Delsym to help with cough.  Breathing overall stable.  Follow. Notify me if symptoms worsen/persist

## 2021-12-01 NOTE — Assessment & Plan Note (Signed)
Hold on medication.  Follow met b and a1c.

## 2021-12-01 NOTE — Assessment & Plan Note (Signed)
Being followed by radiation oncology and oncology.  Eating.  Receiving chemo and XRT.  Follow

## 2021-12-02 ENCOUNTER — Ambulatory Visit
Admission: RE | Admit: 2021-12-02 | Discharge: 2021-12-02 | Disposition: A | Discharge status: Home / Self Care | Payer: Medicare PPO | Source: Ambulatory Visit | Attending: Radiation Oncology | Admitting: Radiation Oncology

## 2021-12-02 ENCOUNTER — Encounter: Payer: Self-pay | Admitting: Radiation Oncology

## 2021-12-02 VITALS — BP 135/81 | HR 108 | Temp 97.1°F | Resp 20 | Wt 124.6 lb

## 2021-12-02 DIAGNOSIS — R54 Age-related physical debility: Secondary | ICD-10-CM | POA: Insufficient documentation

## 2021-12-02 DIAGNOSIS — Z08 Encounter for follow-up examination after completed treatment for malignant neoplasm: Secondary | ICD-10-CM | POA: Diagnosis not present

## 2021-12-02 DIAGNOSIS — Z923 Personal history of irradiation: Secondary | ICD-10-CM | POA: Insufficient documentation

## 2021-12-02 DIAGNOSIS — C154 Malignant neoplasm of middle third of esophagus: Secondary | ICD-10-CM

## 2021-12-02 NOTE — Progress Notes (Signed)
Radiation Oncology Follow up Note  Name: Heather Cooper   Date:   12/02/2021 MRN:  [id number] DOB: [birth year]    This 88 y.o. 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  REFERRING PROVIDER: [name], MD  HPI: Patient is an 88 year old female now at 5 months having completed radiation therapy to her midesophagus for a stage II's squamous cell carcinoma.  She is also been treated with concurrent chemoradiation therapy back in 2015 for stage IV adenocarcinoma.  Seen today in routine follow-up she is doing fairly well she is quite frail she is having no dysphagia at this time..  She had a PET scan back in April showing resolution of metabolic activity in the mid esophagus although there was persistent hypermetabolic activity within the subcarinal lymph node.  No evidence of liver or distant disease was noted.  She underwent biopsy showing metastatic carcinoma.  She has been started on Opdivo by medical oncology and has received her first dose and she is tolerated that well.  COMPLICATIONS OF TREATMENT: none  FOLLOW UP COMPLIANCE: keeps appointments   PHYSICAL EXAM:  BP 135/81 (BP Location: Left Arm, Patient Position: Sitting, Cuff Size: Small)   Pulse (!) 108   Temp (!) 97.1 F (36.2 C) (Tympanic)   Resp 20   Wt 124 lb 9.6 oz (56.5 kg)   BMI 22.07 kg/m  Thin frail female in NAD.  Well-developed well-nourished patient in NAD. HEENT reveals PERLA, EOMI, discs not visualized.  Oral cavity is clear. No oral mucosal lesions are identified. Neck is clear without evidence of cervical or supraclavicular adenopathy. Lungs are clear to A&P. Cardiac examination is essentially unremarkable with regular rate and rhythm without murmur rub or thrill. Abdomen is benign with no  organomegaly or masses noted. Motor sensory and DTR levels are equal and symmetric in the upper and lower extremities. Cranial nerves II through XII are grossly intact. Proprioception is intact. No peripheral adenopathy or edema is identified. No motor or sensory levels are noted. Crude visual fields are within normal range.  RADIOLOGY RESULTS: PET scan reviewed compatible with above-stated findings  PLAN: Present time patient continues on immunotherapy with Opdivo.  At this time of asked to see her back in 6 months for follow-up.  We will be happy to reevaluate if any palliative radiation therapy be indicated.  Patient and son both comprehend my treatment plan well.  I would like to take this opportunity to thank you for allowing me to participate in the care of your patient.Noreene Filbert, MD

## 2021-12-03 ENCOUNTER — Encounter: Payer: Self-pay | Admitting: Internal Medicine

## 2021-12-03 ENCOUNTER — Inpatient Hospital Stay (HOSPITAL_BASED_OUTPATIENT_CLINIC_OR_DEPARTMENT_OTHER): Payer: Medicare PPO | Admitting: Internal Medicine

## 2021-12-03 ENCOUNTER — Inpatient Hospital Stay: Payer: Medicare PPO

## 2021-12-03 DIAGNOSIS — D490 Neoplasm of unspecified behavior of digestive system: Secondary | ICD-10-CM

## 2021-12-03 DIAGNOSIS — C3411 Malignant neoplasm of upper lobe, right bronchus or lung: Secondary | ICD-10-CM | POA: Diagnosis not present

## 2021-12-03 DIAGNOSIS — D6481 Anemia due to antineoplastic chemotherapy: Secondary | ICD-10-CM | POA: Diagnosis not present

## 2021-12-03 DIAGNOSIS — C154 Malignant neoplasm of middle third of esophagus: Secondary | ICD-10-CM | POA: Diagnosis not present

## 2021-12-03 DIAGNOSIS — Z79899 Other long term (current) drug therapy: Secondary | ICD-10-CM | POA: Diagnosis not present

## 2021-12-03 DIAGNOSIS — Z5112 Encounter for antineoplastic immunotherapy: Secondary | ICD-10-CM | POA: Diagnosis not present

## 2021-12-03 LAB — COMPREHENSIVE METABOLIC PANEL
ALT: 15 U/L (ref 0–44)
AST: 25 U/L (ref 15–41)
Albumin: 3.6 g/dL (ref 3.5–5.0)
Alkaline Phosphatase: 77 U/L (ref 38–126)
Anion gap: 10 (ref 5–15)
BUN: 16 mg/dL (ref 8–23)
CO2: 27 mmol/L (ref 22–32)
Calcium: 9.9 mg/dL (ref 8.9–10.3)
Chloride: 96 mmol/L — ABNORMAL LOW (ref 98–111)
Creatinine, Ser: 0.85 mg/dL (ref 0.44–1.00)
GFR, Estimated: >60 mL/min (ref >60)
Glucose, Bld: 123 mg/dL — ABNORMAL HIGH (ref 70–99)
Potassium: 3.8 mmol/L (ref 3.5–5.1)
Sodium: 133 mmol/L — ABNORMAL LOW (ref 135–145)
Total Bilirubin: 0.5 mg/dL (ref 0.3–1.2)
Total Protein: 8.2 g/dL — ABNORMAL HIGH (ref 6.5–8.1)

## 2021-12-03 LAB — CBC WITH DIFFERENTIAL/PLATELET
Abs Immature Granulocytes: 0.03 10*3/uL (ref 0.00–0.07)
Basophils Absolute: 0.1 10*3/uL (ref 0.0–0.1)
Basophils Relative: 1 %
Eosinophils Absolute: 0.3 10*3/uL (ref 0.0–0.5)
Eosinophils Relative: 4 %
HCT: 31.9 % — ABNORMAL LOW (ref 36.0–46.0)
Hemoglobin: 10.6 g/dL — ABNORMAL LOW (ref 12.0–15.0)
Immature Granulocytes: 0 %
Lymphocytes Relative: 18 %
Lymphs Abs: 1.5 10*3/uL (ref 0.7–4.0)
MCH: 29 pg (ref 26.0–34.0)
MCHC: 33.2 g/dL (ref 30.0–36.0)
MCV: 87.2 fL (ref 80.0–100.0)
Monocytes Absolute: 0.8 10*3/uL (ref 0.1–1.0)
Monocytes Relative: 9 %
Neutro Abs: 5.6 10*3/uL (ref 1.7–7.7)
Neutrophils Relative %: 68 %
Platelets: 271 10*3/uL (ref 150–400)
RBC: 3.66 MIL/uL — ABNORMAL LOW (ref 3.87–5.11)
RDW: 13.2 % (ref 11.5–15.5)
WBC: 8.2 10*3/uL (ref 4.0–10.5)
nRBC: 0 % (ref 0.0–0.2)

## 2021-12-03 MED ORDER — SODIUM CHLORIDE 0.9 % IV SOLN
240.0000 mg | Freq: Once | INTRAVENOUS | Status: AC
  Administered 2021-12-03: 240 mg via INTRAVENOUS
  Filled 2021-12-03: qty 24

## 2021-12-03 MED ORDER — SODIUM CHLORIDE 0.9 % IV SOLN
Freq: Once | INTRAVENOUS | Status: AC
  Filled 2021-12-03: qty 250

## 2021-12-03 NOTE — Progress Notes (Signed)
Pt concerned thyroid level is low, has more fatigue and no energy x2 months.

## 2021-12-03 NOTE — Progress Notes (Signed)
[facility] OFFICE PROGRESS NOTE  Patient Care Team: Dale Rutland, MD as PCP - General (Internal Medicine) Benita Gutter, RN as Oncology Nurse Navigator Earna Coder, MD as Consulting Physician (Oncology)   Cancer Staging  Neoplasm of middle third of esophagus Staging form: Esophagus - Squamous Cell Carcinoma, AJCC 8th Edition - Clinical: Stage Unknown (cTX, cN1, cM0) - Signed by Earna Coder, MD on 05/10/2021 - Pathologic: No stage assigned - Unsigned   Oncology History Overview Note  # 2015-patient is an 88 year old female with 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  Starting radiation and chemotherapy from April 14, 2014 Patient was started on carboplatinum andTaxol Herve were developed an allergic reaction to Taxol so would be changed to Abraxane 3.patient has finished 6 cycles of weekly chemotherapy with carboplatinum  and radiation therapy(May 28, 2014) 4.started on  NIVOLULAMAB because of persistent disease July 02, 2014. 5.  NIVOLULAMAB was discontinued because of persistent diarrhea in July of 2016.  August of 2016 CT scan was stable so no further chemotherapy  # AUG 25th PET- STABLE RUL MASS [radiation fibrosis; <1cm ? 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   APRIL 16th, 2023- PET scan-  Resolution of metabolic activity in the mid esophagus;  Persistent hypermetabolic activity within a subcarinal lymph node. Differential includes residual carcinoma versus reactive adenopathy.  No evidence of distant metastatic disease.  # MAY 18th, 2023- s/p EUS  [Dr.Spaete]-endoscopic biopsy of the mediastinal lymph node. MAY 18th, 2023- Status post endoscopic ultrasound/biopsy of the mediastinal/subcarinal lymph node- BIOPSY POSITIVE FOR RECURRENT SQUAMOUS CELL.  # JUNE 9th, 2023- Opdivo every 2 weeks.   # AAA/ 3.3x 3.9 Stable [Dr.Schnier] -----------------------------------------------------       History of lung cancer  Malignant neoplasm of right upper lobe of lung (HCC)  08/07/2019 Initial Diagnosis   Malignant neoplasm of right upper lobe of lung (HCC)    INTERVAL HISTORY: Ambulating with a rolling walker.  Accompanied by her son, Heather Cooper.   Heather Cooper 88 y.o.  female pleasant patient  diagnosed squamous cell carcinoma of the midesophagus-with recurrent disease in the mediastinum currently on OPDIVO is here for a follow up.  Complains of poor appetite.  Complains of weight loss.  Complains of worsening fatigue.  Complains of subjective feeling of cold.  Cough improved  Notes have improvement of her difficulty swallowing.  Review of Systems  Constitutional:  Positive for malaise/fatigue. Negative for chills, diaphoresis, fever and weight loss.  HENT:  Negative for nosebleeds and sore throat.   Eyes:  Negative for double vision.  Respiratory:  Negative for hemoptysis, sputum production, shortness of breath and wheezing.   Cardiovascular:  Negative for chest pain, palpitations, orthopnea and leg swelling.  Gastrointestinal:  Negative for abdominal pain, blood in stool, constipation, diarrhea, heartburn, melena, nausea and vomiting.  Genitourinary:  Negative for dysuria, frequency and urgency.  Musculoskeletal:  Negative for back pain and joint pain.  Skin: Negative.  Negative for itching and rash.  Neurological:  Negative for dizziness, tingling, focal weakness, weakness and headaches.  Endo/Heme/Allergies:  Does not bruise/bleed easily.  Psychiatric/Behavioral:  Negative for depression.  The patient is not nervous/anxious and does not  have insomnia.      PAST MEDICAL HISTORY :  Past Medical History:  Diagnosis Date   Chemotherapy induced nausea and vomiting    Chicken pox    Esophageal cancer (HCC)    Hypercalcemia    familial hypocalciuric hypercalcemia   Hypercholesterolemia    Hypertension    Lung cancer (HCC)    Osteoporosis    Thyroid disease     PAST SURGICAL HISTORY :   Past Surgical History:  Procedure Laterality Date   ABDOMINAL HYSTERECTOMY     partial   CHOLECYSTECTOMY     COLONOSCOPY WITH PROPOFOL N/A 04/17/2017   Procedure: COLONOSCOPY WITH PROPOFOL;  Surgeon: Scot Jun, MD;  Location[facility] ENDOSCOPY;  Service: Endoscopy;  Laterality: N/A;   ESOPHAGOGASTRODUODENOSCOPY (EGD) WITH PROPOFOL N/A 04/13/2021   Procedure: ESOPHAGOGASTRODUODENOSCOPY (EGD) WITH PROPOFOL;  Surgeon: Midge Minium, MD;  Location: [facility] ENDOSCOPY;  Service: Endoscopy;  Laterality: N/A;   EUS N/A 04/22/2021   Procedure: FULL UPPER ENDOSCOPIC ULTRASOUND (EUS) RADIAL;  Surgeon: Rayann Heman, MD;  Location: [facility] ENDOSCOPY;  Service: Endoscopy;  Laterality: N/A;  [facility] needed   EUS N/A 10/28/2021   Procedure: UPPER ENDOSCOPIC ULTRASOUND (EUS) LINEAR;  Surgeon: Doren Custard, MD;  Location: [facility] ENDOSCOPY;  Service: Gastroenterology;  Laterality: N/A;  [facility]   transvaginal hysterectomy  04/18/05   with anterior colporrhaphy    FAMILY HISTORY :   Family History  Problem Relation Age of Onset   Stroke Mother    Hypertension Mother    Prostate cancer Father    Cancer Father        prostate   Heart disease Brother        s/p CABG   Colon cancer Neg Hx     SOCIAL HISTORY:   Social History   Tobacco Use   Smoking status: Former    Types: Cigarettes    Quit date: 06/13/1997    Years since quitting: 24.4   Smokeless tobacco: Never  Vaping Use   Vaping Use: Never used  Substance Use Topics   Alcohol use: No    Alcohol/week: 0.0 standard drinks of alcohol   Drug use: No    ALLERGIES:  is allergic to  paclitaxel.  MEDICATIONS:  Current Outpatient Medications  Medication Sig Dispense Refill   albuterol (VENTOLIN HFA) 108 (90 Base) MCG/ACT inhaler Inhale 2 puffs into the lungs every 6 (six) hours as needed for wheezing or shortness of breath. 8 g 2   cholecalciferol (VITAMIN D3) 25 MCG (1000 UNIT) tablet Take 1,000 Units by mouth daily.     fluticasone (FLONASE) 50 MCG/ACT nasal spray Place 2 sprays into both nostrils daily. 16 g 3   levocetirizine (XYZAL) 5 MG tablet Take 1 tablet (5 mg total) by mouth every evening. 90 tablet 2   olmesartan-hydrochlorothiazide (BENICAR HCT) 20-12.5 MG tablet TAKE 1 TABLET BY MOUTH ONCE DAILY 90 tablet 1   pantoprazole (PROTONIX) 40 MG tablet TAKE 1 TABLET BY MOUTH ONCE DAILY 90 tablet 3   diphenoxylate-atropine (LOMOTIL) 2.5-0.025 MG tablet Take 1 tablet by mouth 4 (four) times daily as needed for diarrhea or loose stools. Take it along with immodium (Patient not taking: Reported on 12/03/2021) 30 tablet 0   No current facility-administered medications for this visit.   Facility-Administered Medications Ordered in Other Visits  Medication Dose Route Frequency Provider Last Rate Last Admin   sodium chloride 0.9 % 1,000 mL with potassium chloride 20  mEq, magnesium sulfate 2 g infusion   Intravenous Continuous Johney Maine, MD   Stopped at 12/04/14 1300    PHYSICAL EXAMINATION: ECOG PERFORMANCE STATUS: 0 - Asymptomatic  BP 127/74 (BP Location: Right Arm, Patient Position: Sitting, Cuff Size: Normal)   Pulse 98   Temp 98.3 F (36.8 C) (Tympanic)   Ht 5\' 3"  (1.6 m)   Wt 129 lb 9.6 oz (58.8 kg)   SpO2 99%   BMI 22.96 kg/m   Filed Weights   12/03/21 1003  Weight: 129 lb 9.6 oz (58.8 kg)      Physical Exam HENT:     Head: Normocephalic and atraumatic.     Mouth/Throat:     Pharynx: No oropharyngeal exudate.  Eyes:     Pupils: Pupils are equal, round, and reactive to light.  Cardiovascular:     Rate and Rhythm: Normal rate and regular  rhythm.  Pulmonary:     Effort: Pulmonary effort is normal. No respiratory distress.     Breath sounds: Normal breath sounds. No wheezing.  Abdominal:     General: Bowel sounds are normal. There is no distension.     Palpations: Abdomen is soft. There is no mass.     Tenderness: There is no abdominal tenderness. There is no guarding or rebound.  Musculoskeletal:        General: No tenderness. Normal range of motion.     Cervical back: Normal range of motion and neck supple.  Skin:    General: Skin is warm.  Neurological:     Mental Status: She is alert and oriented to person, place, and time.  Psychiatric:        Mood and Affect: Affect normal.      LABORATORY DATA:  I have reviewed the data as listed    Component Value Date/Time   NA 133 (L) 12/03/2021 0953   NA 130 (L) 10/09/2014 0846   K 3.8 12/03/2021 0953   K 4.1 10/09/2014 0846   CL 96 (L) 12/03/2021 0953   CL 98 (L) 10/09/2014 0846   CO2 27 12/03/2021 0953   CO2 25 10/09/2014 0846   GLUCOSE 123 (H) 12/03/2021 0953   GLUCOSE 161 (H) 10/09/2014 0846   BUN 16 12/03/2021 0953   BUN 14 10/09/2014 0846   CREATININE 0.85 12/03/2021 0953   CREATININE 0.70 10/09/2014 0846   CALCIUM 9.9 12/03/2021 0953   CALCIUM 9.1 10/09/2014 0846   PROT 8.2 (H) 12/03/2021 0953   PROT 7.3 10/09/2014 0846   ALBUMIN 3.6 12/03/2021 0953   ALBUMIN 3.6 10/09/2014 0846   AST 25 12/03/2021 0953   AST 16 10/09/2014 0846   ALT 15 12/03/2021 0953   ALT 13 (L) 10/09/2014 0846   ALKPHOS 77 12/03/2021 0953   ALKPHOS 70 10/09/2014 0846   BILITOT 0.5 12/03/2021 0953   BILITOT 1.0 10/09/2014 0846   GFRNONAA >60 12/03/2021 0953   GFRNONAA >60 10/09/2014 0846   GFRAA >60 02/05/2020 1004   GFRAA >60 10/09/2014 0846    No results found for: "SPEP", "UPEP"  Lab Results  Component Value Date   WBC 8.2 12/03/2021   NEUTROABS 5.6 12/03/2021   HGB 10.6 (L) 12/03/2021   HCT 31.9 (L) 12/03/2021   MCV 87.2 12/03/2021   PLT 271 12/03/2021       Chemistry      Component Value Date/Time   NA 133 (L) 12/03/2021 0953   NA 130 (L) 10/09/2014 0846   K 3.8 12/03/2021 0953   K  4.1 10/09/2014 0846   CL 96 (L) 12/03/2021 0953   CL 98 (L) 10/09/2014 0846   CO2 27 12/03/2021 0953   CO2 25 10/09/2014 0846   BUN 16 12/03/2021 0953   BUN 14 10/09/2014 0846   CREATININE 0.85 12/03/2021 0953   CREATININE 0.70 10/09/2014 0846   GLU 111 03/18/2014 1048      Component Value Date/Time   CALCIUM 9.9 12/03/2021 0953   CALCIUM 9.1 10/09/2014 0846   ALKPHOS 77 12/03/2021 0953   ALKPHOS 70 10/09/2014 0846   AST 25 12/03/2021 0953   AST 16 10/09/2014 0846   ALT 15 12/03/2021 0953   ALT 13 (L) 10/09/2014 0846   BILITOT 0.5 12/03/2021 0953   BILITOT 1.0 10/09/2014 0846       RADIOGRAPHIC STUDIES: I have personally reviewed the radiological images as listed and agreed with the findings in the report. No results found.    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  APRIL 16th, 2023- PET scan-  Resolution of metabolic activity in the mid esophagus; however persistent hypermetabolic activity within a subcarinal lymph node. MAY 18th, 2023- Status post endoscopic ultrasound/biopsy of the mediastinal/subcarinal lymph node- BIOPSY POSITIVE FOR RECURRENT SQUAMOUS CELL. [JUNE 9th, 2023]- OPDIVO q 2 W.   #Proceed with Opdivo every 2 weeks.  Labs today reviewed;  acceptable for treatment today.    # Cough-? Sec to reflux; continue allergy medications/PPI; remote hx of smoking- recommend Albuterol prn. On cough meds per Dr.Scott.  Improved.   # Mild  Anemia- improved- Hb 10-11- sec to chemo-STABLE.   # FBG- 126-overall stable.  Monitor for now [Gluerna protein shakes]  # Debility/risk of falls s/p evaluation with Marisue Humble; on PT-physical therapy- STABLE.   #Hx of goitre-discussed with  the patient and family that her symptoms are not exactly consistent with hyperthyroidism [patient has poor appetite; feels cold; however complains of fatigue and weight loss]. TSH low-however normal free T3-T4 recommend evaluation with endocrinology. Discussed with Dr. Lorin Picket.  # DISPOSITION:  # refer to Dr.Stevens South/endocrinology re: subclinical hyperthyroidism.  # OPDIVO today- # follow up in 2 weeks- MD; labs- cbc/cmp;OPDIVO- Dr.B  Cc; Dr.Scott      Orders Placed This Encounter  Procedures   Ambulatory referral to Endocrinology    Referral Priority:   Routine    Referral Type:   Consultation    Referral Reason:   Specialty Services Required    Referred to Provider:   Adrian Prince, MD    Requested Specialty:   Endocrinology    Number of Visits Requested:   1    All questions were answered. The patient knows to call the clinic with any problems, questions or concerns.      Earna Coder, MD 12/03/2021 12:45 PM

## 2021-12-07 ENCOUNTER — Telehealth: Payer: Self-pay

## 2021-12-17 ENCOUNTER — Encounter: Payer: Self-pay | Admitting: Internal Medicine

## 2021-12-17 ENCOUNTER — Inpatient Hospital Stay: Payer: Medicare PPO | Attending: Internal Medicine

## 2021-12-17 ENCOUNTER — Inpatient Hospital Stay (HOSPITAL_BASED_OUTPATIENT_CLINIC_OR_DEPARTMENT_OTHER): Payer: Medicare PPO | Admitting: Internal Medicine

## 2021-12-17 ENCOUNTER — Inpatient Hospital Stay: Payer: Medicare PPO

## 2021-12-17 ENCOUNTER — Telehealth: Payer: Self-pay

## 2021-12-17 DIAGNOSIS — C781 Secondary malignant neoplasm of mediastinum: Secondary | ICD-10-CM | POA: Diagnosis not present

## 2021-12-17 DIAGNOSIS — Z79899 Other long term (current) drug therapy: Secondary | ICD-10-CM | POA: Diagnosis not present

## 2021-12-17 DIAGNOSIS — C154 Malignant neoplasm of middle third of esophagus: Secondary | ICD-10-CM | POA: Insufficient documentation

## 2021-12-17 DIAGNOSIS — D490 Neoplasm of unspecified behavior of digestive system: Secondary | ICD-10-CM

## 2021-12-17 DIAGNOSIS — R197 Diarrhea, unspecified: Secondary | ICD-10-CM | POA: Diagnosis not present

## 2021-12-17 DIAGNOSIS — D649 Anemia, unspecified: Secondary | ICD-10-CM | POA: Insufficient documentation

## 2021-12-17 DIAGNOSIS — Z87891 Personal history of nicotine dependence: Secondary | ICD-10-CM | POA: Diagnosis not present

## 2021-12-17 DIAGNOSIS — Z5112 Encounter for antineoplastic immunotherapy: Secondary | ICD-10-CM | POA: Insufficient documentation

## 2021-12-17 LAB — CBC WITH DIFFERENTIAL/PLATELET
Abs Immature Granulocytes: 0.02 10*3/uL (ref 0.00–0.07)
Basophils Absolute: 0.1 10*3/uL (ref 0.0–0.1)
Basophils Relative: 1 %
Eosinophils Absolute: 0.5 10*3/uL (ref 0.0–0.5)
Eosinophils Relative: 7 %
HCT: 32.8 % — ABNORMAL LOW (ref 36.0–46.0)
Hemoglobin: 10.5 g/dL — ABNORMAL LOW (ref 12.0–15.0)
Immature Granulocytes: 0 %
Lymphocytes Relative: 19 %
Lymphs Abs: 1.4 10*3/uL (ref 0.7–4.0)
MCH: 28.8 pg (ref 26.0–34.0)
MCHC: 32 g/dL (ref 30.0–36.0)
MCV: 89.9 fL (ref 80.0–100.0)
Monocytes Absolute: 0.6 10*3/uL (ref 0.1–1.0)
Monocytes Relative: 9 %
Neutro Abs: 4.7 10*3/uL (ref 1.7–7.7)
Neutrophils Relative %: 64 %
Platelets: 218 10*3/uL (ref 150–400)
RBC: 3.65 MIL/uL — ABNORMAL LOW (ref 3.87–5.11)
RDW: 14 % (ref 11.5–15.5)
WBC: 7.2 10*3/uL (ref 4.0–10.5)
nRBC: 0 % (ref 0.0–0.2)

## 2021-12-17 LAB — COMPREHENSIVE METABOLIC PANEL
ALT: 14 U/L (ref 0–44)
AST: 26 U/L (ref 15–41)
Albumin: 3.8 g/dL (ref 3.5–5.0)
Alkaline Phosphatase: 70 U/L (ref 38–126)
Anion gap: 9 (ref 5–15)
BUN: 17 mg/dL (ref 8–23)
CO2: 28 mmol/L (ref 22–32)
Calcium: 10.1 mg/dL (ref 8.9–10.3)
Chloride: 98 mmol/L (ref 98–111)
Creatinine, Ser: 0.79 mg/dL (ref 0.44–1.00)
GFR, Estimated: >60 mL/min (ref >60)
Glucose, Bld: 196 mg/dL — ABNORMAL HIGH (ref 70–99)
Potassium: 3.8 mmol/L (ref 3.5–5.1)
Sodium: 135 mmol/L (ref 135–145)
Total Bilirubin: 0.5 mg/dL (ref 0.3–1.2)
Total Protein: 7.5 g/dL (ref 6.5–8.1)

## 2021-12-17 MED ORDER — SODIUM CHLORIDE 0.9 % IV SOLN
Freq: Once | INTRAVENOUS | Status: AC
  Filled 2021-12-17: qty 250

## 2021-12-17 MED ORDER — SODIUM CHLORIDE 0.9 % IV SOLN
240.0000 mg | Freq: Once | INTRAVENOUS | Status: AC
  Administered 2021-12-17: 240 mg via INTRAVENOUS
  Filled 2021-12-17: qty 24

## 2021-12-17 NOTE — Progress Notes (Signed)
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  Starting radiation and chemotherapy from April 14, 2014 Patient was started on carboplatinum andTaxol Herve were developed an allergic reaction to Taxol so would be changed to Abraxane 3.patient has finished 6 cycles of weekly chemotherapy with carboplatinum  and radiation therapy(May 28, 2014) 4.started on  NIVOLULAMAB because of persistent disease July 02, 2014. 5.  NIVOLULAMAB was discontinued because of persistent diarrhea in July of 2016.  August of 2016 CT scan was stable so no further chemotherapy  # AUG 25th PET- STABLE RUL MASS [radiation fibrosis; <1cm ? 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   APRIL 16th, 2023- PET scan-  Resolution of metabolic activity in the mid esophagus;  Persistent hypermetabolic activity within a subcarinal lymph node. Differential includes residual carcinoma versus reactive adenopathy.  No evidence of distant metastatic disease.  # MAY 18th, 2023- s/p EUS  [Dr.Spaete]-endoscopic biopsy of the mediastinal lymph node. MAY 18th, 2023- Status post endoscopic ultrasound/biopsy of the mediastinal/subcarinal lymph node- BIOPSY POSITIVE FOR RECURRENT SQUAMOUS CELL.  # JUNE 9th, 2023- Opdivo every 2 weeks.   # AAA/ 3.3x 3.9 Stable [Dr.Schnier] -----------------------------------------------------       History of lung cancer  Malignant neoplasm of right upper lobe of lung ([place])  08/07/2019 Initial Diagnosis   Malignant neoplasm of right upper lobe of lung ([place])    INTERVAL HISTORY: Ambulating with a rolling walker.  Accompanied by her daughter Arnita Koons 88 y.o.  female [place] patient  diagnosed squamous cell carcinoma of the midesophagus-with recurrent disease in the mediastinum currently on OPDIVO is here for a follow up.  Patient states her appetite is improved.  Not lost any weight since the last visit.  No worsening fatigue.  Cough improved.  Notes have improvement of her difficulty swallowing.  Not made appointment with endocrinology yet.  Review of Systems  Constitutional:  Positive for malaise/fatigue. Negative for chills, diaphoresis, fever and weight loss.  HENT:  Negative for nosebleeds and sore throat.   Eyes:  Negative for double vision.  Respiratory:  Negative for hemoptysis, sputum production, shortness of breath and wheezing.   Cardiovascular:  Negative for chest pain, palpitations, orthopnea and leg swelling.  Gastrointestinal:  Negative for abdominal pain, blood in stool, constipation, diarrhea, heartburn, melena, nausea and vomiting.  Genitourinary:  Negative for dysuria, frequency and urgency.  Musculoskeletal:  Negative for back pain and joint pain.  Skin: Negative.  Negative for itching and rash.  Neurological:  Negative for dizziness, tingling, focal weakness, weakness and headaches.  Endo/Heme/Allergies:  Does not bruise/bleed easily.  Psychiatric/Behavioral:  Negative for depression. The patient is not  nervous/anxious and does not have insomnia.      PAST MEDICAL HISTORY :  Past Medical History:  Diagnosis Date   Chemotherapy induced nausea and vomiting    Chicken pox    Esophageal cancer (HCC)    Hypercalcemia    familial hypocalciuric hypercalcemia   Hypercholesterolemia    Hypertension    Lung cancer ([place])    Osteoporosis    Thyroid disease     PAST SURGICAL HISTORY :   Past Surgical History:  Procedure Laterality Date   ABDOMINAL HYSTERECTOMY     partial   CHOLECYSTECTOMY     COLONOSCOPY WITH PROPOFOL N/A 04/17/2017   Procedure: COLONOSCOPY WITH PROPOFOL;  Surgeon: Manya Silvas, MD;  Location: [facility] ENDOSCOPY;  Service: Endoscopy;  Laterality: N/A;   ESOPHAGOGASTRODUODENOSCOPY (EGD) WITH PROPOFOL N/A 04/13/2021   Procedure: ESOPHAGOGASTRODUODENOSCOPY (EGD) WITH PROPOFOL;  Surgeon: [name], MD;  Location: [facility] ENDOSCOPY;  Service: Endoscopy;  Laterality: N/A;   EUS N/A 04/22/2021   Procedure: FULL UPPER ENDOSCOPIC ULTRASOUND (EUS) RADIAL;  Surgeon: Jola Schmidt, MD;  Location: [facility] ENDOSCOPY;  Service: Endoscopy;  Laterality: N/A;  [facility] needed   EUS N/A 10/28/2021   Procedure: UPPER ENDOSCOPIC ULTRASOUND (EUS) LINEAR;  Surgeon: Reita Cliche, MD;  Location: [facility] ENDOSCOPY;  Service: Gastroenterology;  Laterality: N/A;  [facility]   transvaginal hysterectomy  04/18/05   with anterior colporrhaphy    FAMILY HISTORY :   Family History  Problem Relation Age of Onset   Stroke Mother    Hypertension Mother    Prostate cancer Father    Cancer Father        prostate   Heart disease Brother        s/p CABG   Colon cancer Neg Hx     SOCIAL HISTORY:   Social History   Tobacco Use   Smoking status: Former    Types: Cigarettes    Quit date: 06/13/1997    Years since quitting: 24.5   Smokeless tobacco: Never  Vaping Use   Vaping Use: Never used  Substance Use Topics   Alcohol use: No    Alcohol/week: 0.0 standard drinks of alcohol   Drug use: No     ALLERGIES:  is allergic to paclitaxel.  MEDICATIONS:  Current Outpatient Medications  Medication Sig Dispense Refill   albuterol (VENTOLIN HFA) 108 (90 Base) MCG/ACT inhaler Inhale 2 puffs into the lungs every 6 (six) hours as needed for wheezing or shortness of breath. 8 g 2   cholecalciferol (VITAMIN D3) 25 MCG (1000 UNIT) tablet Take 1,000 Units by mouth daily.     fluticasone (FLONASE) 50 MCG/ACT nasal spray Place 2 sprays into both nostrils daily. 16 g 3   levocetirizine (XYZAL) 5 MG tablet Take 1 tablet (5 mg total) by mouth every evening. 90 tablet 2   olmesartan-hydrochlorothiazide (BENICAR HCT) 20-12.5 MG tablet TAKE 1 TABLET BY MOUTH ONCE DAILY 90 tablet 1   pantoprazole (PROTONIX) 40 MG tablet TAKE 1 TABLET BY MOUTH ONCE DAILY 90 tablet 3   diphenoxylate-atropine (LOMOTIL) 2.5-0.025 MG tablet Take 1 tablet by mouth 4 (four) times daily as needed for diarrhea or loose stools. Take it along with immodium (Patient not taking: Reported on 12/03/2021) 30 tablet 0   No current facility-administered medications for this visit.   Facility-Administered Medications Ordered in Other Visits  Medication Dose Route Frequency Provider Last Rate Last Admin   sodium chloride 0.9 % 1,000 mL with  potassium chloride 20 mEq, magnesium sulfate 2 g infusion   Intravenous Continuous Forest Gleason, MD   Stopped at 12/04/14 1300    PHYSICAL EXAMINATION: ECOG PERFORMANCE STATUS: 0 - Asymptomatic  BP 128/74 (BP Location: Left Arm, Patient Position: Sitting, Cuff Size: Normal)   Pulse 91   Temp 97.6 F (36.4 C) (Tympanic)   Ht _0  (1.6 m)   Wt 130 lb (59 kg)   SpO2 100%   BMI 23.03 kg/m   Filed Weights   12/17/21 0854  Weight: 130 lb (59 kg)      Physical Exam HENT:     Head: Normocephalic and atraumatic.     Mouth/Throat:     Pharynx: No oropharyngeal exudate.  Eyes:     Pupils: Pupils are equal, round, and reactive to light.  Cardiovascular:     Rate and Rhythm: Normal rate and  regular rhythm.  Pulmonary:     Effort: Pulmonary effort is normal. No respiratory distress.     Breath sounds: Normal breath sounds. No wheezing.  Abdominal:     General: Bowel sounds are normal. There is no distension.     Palpations: Abdomen is soft. There is no mass.     Tenderness: There is no abdominal tenderness. There is no guarding or rebound.  Musculoskeletal:        General: No tenderness. Normal range of motion.     Cervical back: Normal range of motion and neck supple.  Skin:    General: Skin is warm.  Neurological:     Mental Status: She is alert and oriented to person, place, and time.  Psychiatric:        Mood and Affect: Affect normal.      LABORATORY DATA:  I have reviewed the data as listed    Component Value Date/Time   NA 135 12/17/2021 0819   NA 130 (L) 10/09/2014 0846   K 3.8 12/17/2021 0819   K 4.1 10/09/2014 0846   CL 98 12/17/2021 0819   CL 98 (L) 10/09/2014 0846   CO2 28 12/17/2021 0819   CO2 25 10/09/2014 0846   GLUCOSE 196 (H) 12/17/2021 0819   GLUCOSE 161 (H) 10/09/2014 0846   BUN 17 12/17/2021 0819   BUN 14 10/09/2014 0846   CREATININE 0.79 12/17/2021 0819   CREATININE 0.70 10/09/2014 0846   CALCIUM 10.1 12/17/2021 0819   CALCIUM 9.1 10/09/2014 0846   PROT 7.5 12/17/2021 0819   PROT 7.3 10/09/2014 0846   ALBUMIN 3.8 12/17/2021 0819   ALBUMIN 3.6 10/09/2014 0846   AST 26 12/17/2021 0819   AST 16 10/09/2014 0846   ALT 14 12/17/2021 0819   ALT 13 (L) 10/09/2014 0846   ALKPHOS 70 12/17/2021 0819   ALKPHOS 70 10/09/2014 0846   BILITOT 0.5 12/17/2021 0819   BILITOT 1.0 10/09/2014 0846   GFRNONAA >60 12/17/2021 0819   GFRNONAA >60 10/09/2014 0846   GFRAA >60 02/05/2020 1004   GFRAA >60 10/09/2014 0846    No results found for: "SPEP", "UPEP"  Lab Results  Component Value Date   WBC 7.2 12/17/2021   NEUTROABS 4.7 12/17/2021   HGB 10.5 (L) 12/17/2021   HCT 32.8 (L) 12/17/2021   MCV 89.9 12/17/2021   PLT 218 12/17/2021       Chemistry      Component Value Date/Time   NA 135 12/17/2021 0819   NA 130 (L) 10/09/2014 0846   K 3.8 12/17/2021 0819   K 4.1 10/09/2014 0846  CL 98 12/17/2021 0819   CL 98 (L) 10/09/2014 0846   CO2 28 12/17/2021 0819   CO2 25 10/09/2014 0846   BUN 17 12/17/2021 0819   BUN 14 10/09/2014 0846   CREATININE 0.79 12/17/2021 0819   CREATININE 0.70 10/09/2014 0846   GLU 111 03/18/2014 1048      Component Value Date/Time   CALCIUM 10.1 12/17/2021 0819   CALCIUM 9.1 10/09/2014 0846   ALKPHOS 70 12/17/2021 0819   ALKPHOS 70 10/09/2014 0846   AST 26 12/17/2021 0819   AST 16 10/09/2014 0846   ALT 14 12/17/2021 0819   ALT 13 (L) 10/09/2014 0846   BILITOT 0.5 12/17/2021 0819   BILITOT 1.0 10/09/2014 0846       RADIOGRAPHIC STUDIES: I have personally reviewed the radiological images as listed and agreed with the findings in the report. No results found.    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  APRIL 16th, 2023- PET scan-  Resolution of metabolic activity in the mid esophagus; however persistent hypermetabolic activity within a subcarinal lymph node. MAY 18th, 2023- Status post endoscopic ultrasound/biopsy of the mediastinal/subcarinal lymph node- BIOPSY POSITIVE FOR RECURRENT SQUAMOUS CELL. [JUNE 9th, 2023]- OPDIVO q 2 W.   #Proceed with Opdivo every 2 weeks.  Labs today reviewed;  acceptable for treatment today.  Will order scans at [facility] after 5th cycle.   # Cough-? Sec to reflux; continue allergy medications/PPI; remote hx of smoking- contine Albuterol prn. On cough meds per Dr.Scott. STABLE   # Mild  Anemia- improved- Hb 10-11- sec to chemo-STABLE.   # PBF-BG- 199-overall stable.  Monitor for now [Gluerna protein shakes]; sent message to [facility].   #Hx of goitre-discussed with the patient and family that her  symptoms are not exactly consistent with hyperthyroidism [patient has poor appetite; feels cold; however complains of fatigue and weight loss]. TSH low-however normal free T3-T4 recommend evaluation with endocrinology.  Awaiting evaluation with Dr. Forde Dandy, [facility] endocrinology.  # DISPOSITION:  # OPDIVO today  # follow up in 2 weeksX- MD; labs- cbc/cmp;OPDIVO  # follow up in 4 weeksMD; labs- cbc/cmp;OPDIVO- Dr.B       No orders of the defined types were placed in this encounter.   All questions were answered. The patient knows to call the clinic with any problems, questions or concerns.      Cammie Sickle, MD 12/17/2021 11:52 AM

## 2021-12-17 NOTE — Assessment & Plan Note (Addendum)
#  Squamous cell carcinoma of the mid-esophagus-NOV 2022-PET scan  TxN1M0 [locally advanced;  s/p  concurrent chemoradiation  [finished Jan 25 th, 2023].  APRIL 16th, 2023- PET scan-  Resolution of metabolic activity in the mid esophagus; however persistent hypermetabolic activity within a subcarinal lymph node. MAY 18th, 2023- Status post endoscopic ultrasound/biopsy of the mediastinal/subcarinal lymph node- BIOPSY POSITIVE FOR RECURRENT SQUAMOUS CELL. [JUNE 9th, 2023]- OPDIVO q 2 W.   #Proceed with Opdivo every 2 weeks.  Labs today reviewed;  acceptable for treatment today.  Will order scans at [facility] after 5th cycle.   # Cough-? Sec to reflux; continue allergy medications/PPI; remote hx of smoking- contine Albuterol prn. On cough meds per Dr.Scott. STABLE   # Mild  Anemia- improved- Hb 10-11- sec to chemo-STABLE.   # PBF-BG- 199-overall stable.  Monitor for now [Gluerna protein shakes]; sent message to [facility].   #Hx of goitre-discussed with the patient and family that her symptoms are not exactly consistent with hyperthyroidism [patient has poor appetite; feels cold; however complains of fatigue and weight loss]. TSH low-however normal free T3-T4 recommend evaluation with endocrinology.  Awaiting evaluation with Dr. Forde Dandy, [facility] endocrinology.  # DISPOSITION:  # OPDIVO today  # follow up in 2 weeksX- MD; labs- cbc/cmp;OPDIVO  # follow up in 4 weeksMD; labs- cbc/cmp;OPDIVO- Dr.B

## 2021-12-17 NOTE — Patient Instructions (Signed)
[facility] CANCER CTR AT [place]  Discharge Instructions: Thank you for choosing [facility] to provide your oncology and hematology care.  If you have a lab appointment with the [facility], please go directly to the [facility] and check in at the registration area.  Wear comfortable clothing and clothing appropriate for easy access to any Portacath or PICC line.   We strive to give you quality time with your provider. You may need to reschedule your appointment if you arrive late (15 or more minutes).  Arriving late affects you and other patients whose appointments are after yours.  Also, if you miss three or more appointments without notifying the office, you may be dismissed from the clinic at the provider's discretion.      For prescription refill requests, have your pharmacy contact our office and allow 72 hours for refills to be completed.    Today you received the following chemotherapy and/or immunotherapy agents Opdivo.      To help prevent nausea and vomiting after your treatment, we encourage you to take your nausea medication as directed.  BELOW ARE SYMPTOMS THAT SHOULD BE REPORTED IMMEDIATELY: *FEVER GREATER THAN 100.4 F (38 C) OR HIGHER *CHILLS OR SWEATING *NAUSEA AND VOMITING THAT IS NOT CONTROLLED WITH YOUR NAUSEA MEDICATION *UNUSUAL SHORTNESS OF BREATH *UNUSUAL BRUISING OR BLEEDING *URINARY PROBLEMS (pain or burning when urinating, or frequent urination) *BOWEL PROBLEMS (unusual diarrhea, constipation, pain near the anus) TENDERNESS IN MOUTH AND THROAT WITH OR WITHOUT PRESENCE OF ULCERS (sore throat, sores in mouth, or a toothache) UNUSUAL RASH, SWELLING OR PAIN  UNUSUAL VAGINAL DISCHARGE OR ITCHING   Items with * indicate a potential emergency and should be followed up as soon as possible or go to the Emergency Department if any problems should occur.  Please show the CHEMOTHERAPY ALERT CARD or IMMUNOTHERAPY ALERT CARD at check-in to the  Emergency Department and triage nurse.  Should you have questions after your visit or need to cancel or reschedule your appointment, please contact [facility] CANCER [place] AT [place]  [phone] and follow the prompts.  Office hours are 8:00 a.m. to 4:30 p.m. Monday - Friday. Please note that voicemails left after 4:00 p.m. may not be returned until the following business day.  We are closed weekends and major holidays. You have access to a nurse at all times for urgent questions. Please call the main number to the clinic [phone] and follow the prompts.  For any non-urgent questions, you may also contact your provider using MyChart. We now offer e-Visits for anyone 75 and older to request care online for non-urgent symptoms. For details visit [email or web address].   Also download the MyChart app! Go to the app store, search "MyChart", open the app, select [place] [facility], and log in with your MyChart username and password.  Masks are optional in the cancer centers. If you would like for your care team to wear a mask while they are taking care of you, please let them know. For doctor visits, patients may have with them one support person who is at least 86 years old. At this time, visitors are not allowed in the infusion area.

## 2021-12-17 NOTE — Telephone Encounter (Signed)
I will refer to another provider and update this once I find a new one.

## 2021-12-17 NOTE — Telephone Encounter (Signed)
Sent referral to [facility] Endocrinology. P: [phone] F: [phone]

## 2021-12-17 NOTE — Telephone Encounter (Signed)
Margie for [facility] called to let Seth Bake Know that Dr.South is currently not taking any new patients and she would have to find another endocrinologist.

## 2021-12-21 NOTE — Telephone Encounter (Addendum)
Fax received from [facility] endocrinologist that they are unable to accept NP referral at this time.  Left message with NP coordinator at [facility] (P: [phone]) to see if there are other endocrinologist available for NP referrals.

## 2021-12-21 NOTE — Telephone Encounter (Signed)
Call received from [facility] that Ms. Losier daughter has called their office and patient has been approved to see Dr. Forde Dandy as NP on 12/28/21.

## 2021-12-28 DIAGNOSIS — R7302 Impaired glucose tolerance (oral): Secondary | ICD-10-CM | POA: Diagnosis not present

## 2021-12-28 DIAGNOSIS — I10 Essential (primary) hypertension: Secondary | ICD-10-CM | POA: Diagnosis not present

## 2021-12-28 DIAGNOSIS — C3490 Malignant neoplasm of unspecified part of unspecified bronchus or lung: Secondary | ICD-10-CM | POA: Diagnosis not present

## 2021-12-28 DIAGNOSIS — I700 Atherosclerosis of aorta: Secondary | ICD-10-CM | POA: Diagnosis not present

## 2021-12-28 DIAGNOSIS — J432 Centrilobular emphysema: Secondary | ICD-10-CM | POA: Diagnosis not present

## 2021-12-28 DIAGNOSIS — C154 Malignant neoplasm of middle third of esophagus: Secondary | ICD-10-CM | POA: Diagnosis not present

## 2021-12-28 DIAGNOSIS — I2510 Atherosclerotic heart disease of native coronary artery without angina pectoris: Secondary | ICD-10-CM | POA: Diagnosis not present

## 2021-12-28 DIAGNOSIS — I7143 Infrarenal abdominal aortic aneurysm, without rupture: Secondary | ICD-10-CM | POA: Diagnosis not present

## 2021-12-28 DIAGNOSIS — E049 Nontoxic goiter, unspecified: Secondary | ICD-10-CM | POA: Diagnosis not present

## 2021-12-31 ENCOUNTER — Other Ambulatory Visit: Payer: Self-pay

## 2021-12-31 ENCOUNTER — Inpatient Hospital Stay: Payer: Medicare PPO

## 2021-12-31 ENCOUNTER — Inpatient Hospital Stay (HOSPITAL_BASED_OUTPATIENT_CLINIC_OR_DEPARTMENT_OTHER): Payer: Medicare PPO | Admitting: Oncology

## 2021-12-31 ENCOUNTER — Encounter: Payer: Self-pay | Admitting: Oncology

## 2021-12-31 VITALS — BP 124/60 | HR 94 | Temp 96.9°F | Resp 14 | Wt 130.3 lb

## 2021-12-31 DIAGNOSIS — R197 Diarrhea, unspecified: Secondary | ICD-10-CM | POA: Diagnosis not present

## 2021-12-31 DIAGNOSIS — Z87891 Personal history of nicotine dependence: Secondary | ICD-10-CM | POA: Diagnosis not present

## 2021-12-31 DIAGNOSIS — Z5112 Encounter for antineoplastic immunotherapy: Secondary | ICD-10-CM

## 2021-12-31 DIAGNOSIS — D490 Neoplasm of unspecified behavior of digestive system: Secondary | ICD-10-CM

## 2021-12-31 DIAGNOSIS — D649 Anemia, unspecified: Secondary | ICD-10-CM

## 2021-12-31 DIAGNOSIS — C781 Secondary malignant neoplasm of mediastinum: Secondary | ICD-10-CM | POA: Diagnosis not present

## 2021-12-31 DIAGNOSIS — Z79899 Other long term (current) drug therapy: Secondary | ICD-10-CM | POA: Diagnosis not present

## 2021-12-31 DIAGNOSIS — C154 Malignant neoplasm of middle third of esophagus: Secondary | ICD-10-CM | POA: Diagnosis not present

## 2021-12-31 LAB — CBC WITH DIFFERENTIAL/PLATELET
Abs Immature Granulocytes: 0.02 10*3/uL (ref 0.00–0.07)
Basophils Absolute: 0.1 10*3/uL (ref 0.0–0.1)
Basophils Relative: 1 %
Eosinophils Absolute: 0.4 10*3/uL (ref 0.0–0.5)
Eosinophils Relative: 6 %
HCT: 34.4 % — ABNORMAL LOW (ref 36.0–46.0)
Hemoglobin: 11.3 g/dL — ABNORMAL LOW (ref 12.0–15.0)
Immature Granulocytes: 0 %
Lymphocytes Relative: 19 %
Lymphs Abs: 1.4 10*3/uL (ref 0.7–4.0)
MCH: 29.4 pg (ref 26.0–34.0)
MCHC: 32.8 g/dL (ref 30.0–36.0)
MCV: 89.6 fL (ref 80.0–100.0)
Monocytes Absolute: 0.6 10*3/uL (ref 0.1–1.0)
Monocytes Relative: 8 %
Neutro Abs: 4.9 10*3/uL (ref 1.7–7.7)
Neutrophils Relative %: 66 %
Platelets: 225 10*3/uL (ref 150–400)
RBC: 3.84 MIL/uL — ABNORMAL LOW (ref 3.87–5.11)
RDW: 14.3 % (ref 11.5–15.5)
WBC: 7.4 10*3/uL (ref 4.0–10.5)
nRBC: 0 % (ref 0.0–0.2)

## 2021-12-31 LAB — COMPREHENSIVE METABOLIC PANEL
ALT: 12 U/L (ref 0–44)
AST: 21 U/L (ref 15–41)
Albumin: 3.7 g/dL (ref 3.5–5.0)
Alkaline Phosphatase: 76 U/L (ref 38–126)
Anion gap: 6 (ref 5–15)
BUN: 17 mg/dL (ref 8–23)
CO2: 29 mmol/L (ref 22–32)
Calcium: 10.1 mg/dL (ref 8.9–10.3)
Chloride: 102 mmol/L (ref 98–111)
Creatinine, Ser: 0.91 mg/dL (ref 0.44–1.00)
GFR, Estimated: >60 mL/min (ref >60)
Glucose, Bld: 206 mg/dL — ABNORMAL HIGH (ref 70–99)
Potassium: 3.9 mmol/L (ref 3.5–5.1)
Sodium: 137 mmol/L (ref 135–145)
Total Bilirubin: 0.5 mg/dL (ref 0.3–1.2)
Total Protein: 7.5 g/dL (ref 6.5–8.1)

## 2021-12-31 LAB — TSH: TSH: 0.628 u[IU]/mL (ref 0.350–4.500)

## 2021-12-31 LAB — T4, FREE: Free T4: 1 ng/dL (ref 0.61–1.12)

## 2021-12-31 MED ORDER — SODIUM CHLORIDE 0.9 % IV SOLN
Freq: Once | INTRAVENOUS | Status: AC
  Filled 2021-12-31: qty 250

## 2021-12-31 MED ORDER — SODIUM CHLORIDE 0.9 % IV SOLN
240.0000 mg | Freq: Once | INTRAVENOUS | Status: AC
  Administered 2021-12-31: 240 mg via INTRAVENOUS
  Filled 2021-12-31: qty 24

## 2021-12-31 NOTE — Progress Notes (Signed)
Pts daughter will like to know if we can add on TSH/Free T3 for Dr. Forde Dandy Endocrinology.

## 2021-12-31 NOTE — Patient Instructions (Addendum)
[facility] CANCER CTR AT [place]  Discharge Instructions: Thank you for choosing [facility] to provide your oncology and hematology care.  If you have a lab appointment with the [facility], please go directly to the [facility] and check in at the registration area.  Wear comfortable clothing and clothing appropriate for easy access to any Portacath or PICC line.   We strive to give you quality time with your provider. You may need to reschedule your appointment if you arrive late (15 or more minutes).  Arriving late affects you and other patients whose appointments are after yours.  Also, if you miss three or more appointments without notifying the office, you may be dismissed from the clinic at the provider's discretion.      For prescription refill requests, have your pharmacy contact our office and allow 72 hours for refills to be completed.    Today you received the following chemotherapy and/or immunotherapy agents opdivo     To help prevent nausea and vomiting after your treatment, we encourage you to take your nausea medication as directed.  BELOW ARE SYMPTOMS THAT SHOULD BE REPORTED IMMEDIATELY: *FEVER GREATER THAN 100.4 F (38 C) OR HIGHER *CHILLS OR SWEATING *NAUSEA AND VOMITING THAT IS NOT CONTROLLED WITH YOUR NAUSEA MEDICATION *UNUSUAL SHORTNESS OF BREATH *UNUSUAL BRUISING OR BLEEDING *URINARY PROBLEMS (pain or burning when urinating, or frequent urination) *BOWEL PROBLEMS (unusual diarrhea, constipation, pain near the anus) TENDERNESS IN MOUTH AND THROAT WITH OR WITHOUT PRESENCE OF ULCERS (sore throat, sores in mouth, or a toothache) UNUSUAL RASH, SWELLING OR PAIN  UNUSUAL VAGINAL DISCHARGE OR ITCHING   Items with * indicate a potential emergency and should be followed up as soon as possible or go to the Emergency Department if any problems should occur.  Please show the CHEMOTHERAPY ALERT CARD or IMMUNOTHERAPY ALERT CARD at check-in to the  Emergency Department and triage nurse.  Should you have questions after your visit or need to cancel or reschedule your appointment, please contact [facility] CANCER [place] AT [place]  [phone] and follow the prompts.  Office hours are 8:00 a.m. to 4:30 p.m. Monday - Friday. Please note that voicemails left after 4:00 p.m. may not be returned until the following business day.  We are closed weekends and major holidays. You have access to a nurse at all times for urgent questions. Please call the main number to the clinic [phone] and follow the prompts.  For any non-urgent questions, you may also contact your provider using MyChart. We now offer e-Visits for anyone 86 and older to request care online for non-urgent symptoms. For details visit [email or web address].   Also download the MyChart app! Go to the app store, search "MyChart", open the app, select [place], and log in with your MyChart username and password.  Masks are optional in the cancer centers. If you would like for your care team to wear a mask while they are taking care of you, please let them know. For doctor visits, patients may have with them one support person who is at least 86 years old. At this time, visitors are not allowed in the infusion area.

## 2021-12-31 NOTE — Progress Notes (Signed)
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  Starting radiation and chemotherapy from April 14, 2014 Patient was started on carboplatinum andTaxol Herve were developed an allergic reaction to Taxol so would be changed to Abraxane 3.patient has finished 6 cycles of weekly chemotherapy with carboplatinum  and radiation therapy(May 28, 2014) 4.started on  NIVOLULAMAB because of persistent disease July 02, 2014. 5.  NIVOLULAMAB was discontinued because of persistent diarrhea in July of 2016.  August of 2016 CT scan was stable so no further chemotherapy  # AUG 25th PET- STABLE RUL MASS [radiation fibrosis; <1cm ? 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   APRIL 16th, 2023- PET scan-  Resolution of metabolic activity in the mid esophagus;  Persistent hypermetabolic activity within a subcarinal lymph node. Differential includes residual carcinoma versus reactive adenopathy.  No evidence of  distant metastatic disease.  # MAY 18th, 2023- s/p EUS [Dr.Spaete]-endoscopic biopsy of the mediastinal lymph node. MAY 18th, 2023- Status post endoscopic ultrasound/biopsy of the mediastinal/subcarinal lymph node- BIOPSY POSITIVE FOR RECURRENT SQUAMOUS CELL.  # JUNE 9th, 2023- Opdivo every 2 weeks.   # AAA/ 3.3x 3.9 Stable [Dr.Schnier] -----------------------------------------------------       History of lung cancer  Malignant neoplasm of right upper lobe of lung ([place])  08/07/2019 Initial Diagnosis   Malignant neoplasm of right upper lobe of lung ([place])      Interval history-tolerating Opdivo well so far.  Denies any specific complaints at this time  ECOG PS- 1 Pain scale- 0  Review of systems- Review of Systems  Constitutional:  Negative for chills, fever, malaise/fatigue and weight loss.  HENT:  Negative for congestion, ear discharge and nosebleeds.   Eyes:  Negative for blurred vision.  Respiratory:  Negative for cough, hemoptysis, sputum production, shortness of breath and wheezing.   Cardiovascular:  Negative for chest pain, palpitations, orthopnea and claudication.  Gastrointestinal:  Negative for abdominal pain, blood in stool, constipation, diarrhea, heartburn, melena, nausea and vomiting.  Genitourinary:  Negative for dysuria, flank pain, frequency, hematuria and urgency.  Musculoskeletal:  Negative for back pain, joint pain and myalgias.  Skin:  Negative for rash.  Neurological:  Negative for dizziness, tingling, focal weakness, seizures, weakness and headaches.  Endo/Heme/Allergies:  Does not bruise/bleed easily.  Psychiatric/Behavioral:  Negative for depression and suicidal ideas. The patient does not have insomnia.       Allergies  Allergen Reactions   Paclitaxel Other (See Comments)    Chest tightness     Past Medical History:  Diagnosis Date   Chemotherapy induced nausea and vomiting  Chicken pox    Esophageal cancer (HCC)    Hypercalcemia     familial hypocalciuric hypercalcemia   Hypercholesterolemia    Hypertension    Lung cancer ([place])    Osteoporosis    Thyroid disease      Past Surgical History:  Procedure Laterality Date   ABDOMINAL HYSTERECTOMY     partial   CHOLECYSTECTOMY     COLONOSCOPY WITH PROPOFOL N/A 04/17/2017   Procedure: COLONOSCOPY WITH PROPOFOL;  Surgeon: Manya Silvas, MD;  Location: [facility] ENDOSCOPY;  Service: Endoscopy;  Laterality: N/A;   ESOPHAGOGASTRODUODENOSCOPY (EGD) WITH PROPOFOL N/A 04/13/2021   Procedure: ESOPHAGOGASTRODUODENOSCOPY (EGD) WITH PROPOFOL;  Surgeon: [name], MD;  Location: [facility] ENDOSCOPY;  Service: Endoscopy;  Laterality: N/A;   EUS N/A 04/22/2021   Procedure: FULL UPPER ENDOSCOPIC ULTRASOUND (EUS) RADIAL;  Surgeon: Jola Schmidt, MD;  Location: [facility] ENDOSCOPY;  Service: Endoscopy;  Laterality: N/A;  [facility] needed   EUS N/A 10/28/2021   Procedure: UPPER ENDOSCOPIC ULTRASOUND (EUS) LINEAR;  Surgeon: Reita Cliche, MD;  Location: [facility] ENDOSCOPY;  Service: Gastroenterology;  Laterality: N/A;  [facility]   transvaginal hysterectomy  04/18/05   with anterior colporrhaphy    Social History   Socioeconomic History   Marital status: Widowed    Spouse name: Not on file   Number of children: 3   Years of education: Not on file   Highest education level: Not on file  Occupational History   Not on file  Tobacco Use   Smoking status: Former    Types: Cigarettes    Quit date: 06/13/1997    Years since quitting: 24.5   Smokeless tobacco: Never  Vaping Use   Vaping Use: Never used  Substance and Sexual Activity   Alcohol use: No    Alcohol/week: 0.0 standard drinks of alcohol   Drug use: No   Sexual activity: Not Currently  Other Topics Concern   Not on file  Social History Narrative   Lost her husband November 2019   Social Determinants of Health   Financial Resource Strain: Low Risk  (01/25/2021)   Overall Financial Resource Strain (CARDIA)    Difficulty of Paying  Living Expenses: Not hard at all  Food Insecurity: No Food Insecurity (01/25/2021)   Hunger Vital Sign    Worried About Running Out of Food in the Last Year: Never true    [place] in the Last Year: Never true  Transportation Needs: No Transportation Needs (01/25/2021)   PRAPARE - Hydrologist (Medical): No    Lack of Transportation (Non-Medical): No  Physical Activity: Unknown (01/25/2021)   Exercise Vital Sign    Days of Exercise per Week: 0 days    Minutes of Exercise per Session: Not on file  Stress: No Stress Concern Present (01/25/2021)   [place]    Feeling of Stress : Not at all  Social Connections: Moderately Integrated (01/25/2021)   Social Connection and Isolation Panel [NHANES]    Frequency of Communication with Friends and Family: More than three times a week    Frequency of Social Gatherings with Friends and Family: More than three times a week    Attends Religious Services: More than 4 times per year    Active Member of [employer] or Organizations: Yes    Attends Archivist Meetings: More than 4 times per year    Marital Status: Widowed  Intimate Partner Violence: Not  At Risk (01/25/2021)   Humiliation, Afraid, Rape, and Kick questionnaire    Fear of Current or Ex-Partner: No    Emotionally Abused: No    Physically Abused: No    Sexually Abused: No    Family History  Problem Relation Age of Onset   Stroke Mother    Hypertension Mother    Prostate cancer Father    Cancer Father        prostate   Heart disease Brother        s/p CABG   Colon cancer Neg Hx      Current Outpatient Medications:    albuterol (VENTOLIN HFA) 108 (90 Base) MCG/ACT inhaler, Inhale 2 puffs into the lungs every 6 (six) hours as needed for wheezing or shortness of breath., Disp: 8 g, Rfl: 2   cholecalciferol (VITAMIN D3) 25 MCG (1000 UNIT) tablet, Take 1,000 Units by mouth daily.,  Disp: , Rfl:    diphenoxylate-atropine (LOMOTIL) 2.5-0.025 MG tablet, Take 1 tablet by mouth 4 (four) times daily as needed for diarrhea or loose stools. Take it along with immodium (Patient not taking: Reported on 12/03/2021), Disp: 30 tablet, Rfl: 0   fluticasone (FLONASE) 50 MCG/ACT nasal spray, Place 2 sprays into both nostrils daily., Disp: 16 g, Rfl: 3   levocetirizine (XYZAL) 5 MG tablet, Take 1 tablet (5 mg total) by mouth every evening., Disp: 90 tablet, Rfl: 2   olmesartan-hydrochlorothiazide (BENICAR HCT) 20-12.5 MG tablet, TAKE 1 TABLET BY MOUTH ONCE DAILY, Disp: 90 tablet, Rfl: 1   pantoprazole (PROTONIX) 40 MG tablet, TAKE 1 TABLET BY MOUTH ONCE DAILY, Disp: 90 tablet, Rfl: 3 No current facility-administered medications for this visit.  Facility-Administered Medications Ordered in Other Visits:    sodium chloride 0.9 % 1,000 mL with potassium chloride 20 mEq, magnesium sulfate 2 g infusion, , Intravenous, Continuous, Choksi, Janak, MD, Stopped at 12/04/14 1300  Physical exam:  Vitals:   12/31/21 0843  BP: 124/60  Pulse: 94  Resp: 14  Temp: (!) 96.9 F (36.1 C)  SpO2: 100%  Weight: 130 lb 4.8 oz (59.1 kg)   Physical Exam Constitutional:      Comments: Ambulates with the help of a walker.  Appears in no acute distress  Cardiovascular:     Rate and Rhythm: Normal rate and regular rhythm.     Heart sounds: Normal heart sounds.  Pulmonary:     Effort: Pulmonary effort is normal.     Breath sounds: Normal breath sounds.  Abdominal:     General: Bowel sounds are normal.     Palpations: Abdomen is soft.  Skin:    General: Skin is warm and dry.  Neurological:     Mental Status: She is alert and oriented to person, place, and time.         Latest Ref Rng & Units 12/17/2021    8:19 AM  CMP  Glucose 70 - 99 mg/dL 196   BUN 8 - 23 mg/dL 17   Creatinine 0.44 - 1.00 mg/dL 0.79   Sodium 135 - 145 mmol/L 135   Potassium 3.5 - 5.1 mmol/L 3.8   Chloride 98 - 111 mmol/L 98    CO2 22 - 32 mmol/L 28   Calcium 8.9 - 10.3 mg/dL 10.1   Total Protein 6.5 - 8.1 g/dL 7.5   Total Bilirubin 0.3 - 1.2 mg/dL 0.5   Alkaline Phos 38 - 126 U/L 70   AST 15 - 41 U/L 26   ALT 0 - 44  U/L 14       Latest Ref Rng & Units 12/31/2021    8:22 AM  CBC  WBC 4.0 - 10.5 K/uL 7.4   Hemoglobin 12.0 - 15.0 g/dL 11.3   Hematocrit 36.0 - 46.0 % 34.4   Platelets 150 - 400 K/uL 225     No images are attached to the encounter.  No results found.   Assessment and plan- Patient is a 88 y.o. 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  She will see Dr. Lynett Fish in 2 weeks for cycle 5.  Plan to likely repeat scans after 6 cycles.  Normocytic anemia: Overall stable between 10.5-11.5.  Continue to monitor   Visit Diagnosis 1. Neoplasm of middle third of esophagus   2. Encounter for antineoplastic immunotherapy   3. Normocytic anemia      Dr. Randa Evens, MD, MPH [facility] at [facility] [phone] 12/31/2021 8:34 AM

## 2021-12-31 NOTE — Progress Notes (Signed)
Nutrition Follow-up:  Add on today:  Patient with SCC of mid-esophagus with recurrent disease in mediastinum.  Patient receiving opdivo.    Met with patient during infusion.  Patient reports that her appetite was decreased but has gotten better recently.  Last night able to eat hamburger patty, with creamed corn and peas.  Lunch yesterday was TV dinner and breakfast was applesauce, muffin and bacon.  Patient living alone and has caregiver 3 days a week for about 3 hours.  Says that she is swallowing all foods without difficulty. Drinking ensure complete 1 a day.  Says that she had a few episodes of diarrhea this week after eating spicy foods and fried foods, unsure if that was cause.  Did take imodium last night and this am.      Medications: reviewed  Labs: glucose 206  Anthropometrics:   Weight 130 lb today  3/16 136 lb after last round of treatments   4% weight loss in the last 4 months  NUTRITION DIAGNOSIS: Inadequate oral intake improved  weight stable but overall declined    INTERVENTION:  Continue 350 calorie shake or higher even though higher in sugar as patient weights continues to decline.  Would encourage controlling blood glucose with medication vs restricting diet Encouraged patient to continue to push calories and protein to help maintain weight    MONITORING, EVALUATION, GOAL: weight trends, intake   NEXT VISIT: Friday, August 4 during infusion  Daimen Shovlin B. Zenia Resides, [place], [place] Registered Dietitian [phone]

## 2022-01-03 ENCOUNTER — Other Ambulatory Visit: Payer: Self-pay

## 2022-01-06 ENCOUNTER — Encounter: Payer: Self-pay | Admitting: Hospice and Palliative Medicine

## 2022-01-06 ENCOUNTER — Other Ambulatory Visit: Payer: Self-pay | Admitting: *Deleted

## 2022-01-06 ENCOUNTER — Inpatient Hospital Stay (HOSPITAL_BASED_OUTPATIENT_CLINIC_OR_DEPARTMENT_OTHER): Payer: Medicare PPO | Admitting: Hospice and Palliative Medicine

## 2022-01-06 ENCOUNTER — Inpatient Hospital Stay: Payer: Medicare PPO

## 2022-01-06 ENCOUNTER — Other Ambulatory Visit: Payer: Self-pay

## 2022-01-06 VITALS — BP 124/68 | HR 93 | Temp 98.1°F | Resp 16

## 2022-01-06 DIAGNOSIS — C781 Secondary malignant neoplasm of mediastinum: Secondary | ICD-10-CM | POA: Diagnosis not present

## 2022-01-06 DIAGNOSIS — D649 Anemia, unspecified: Secondary | ICD-10-CM | POA: Diagnosis not present

## 2022-01-06 DIAGNOSIS — D490 Neoplasm of unspecified behavior of digestive system: Secondary | ICD-10-CM

## 2022-01-06 DIAGNOSIS — R197 Diarrhea, unspecified: Secondary | ICD-10-CM

## 2022-01-06 DIAGNOSIS — E860 Dehydration: Secondary | ICD-10-CM

## 2022-01-06 DIAGNOSIS — Z5112 Encounter for antineoplastic immunotherapy: Secondary | ICD-10-CM | POA: Diagnosis not present

## 2022-01-06 DIAGNOSIS — C154 Malignant neoplasm of middle third of esophagus: Secondary | ICD-10-CM | POA: Diagnosis not present

## 2022-01-06 DIAGNOSIS — Z87891 Personal history of nicotine dependence: Secondary | ICD-10-CM | POA: Diagnosis not present

## 2022-01-06 DIAGNOSIS — Z79899 Other long term (current) drug therapy: Secondary | ICD-10-CM | POA: Diagnosis not present

## 2022-01-06 LAB — COMPREHENSIVE METABOLIC PANEL
ALT: 13 U/L (ref 0–44)
AST: 20 U/L (ref 15–41)
Albumin: 3.7 g/dL (ref 3.5–5.0)
Alkaline Phosphatase: 79 U/L (ref 38–126)
Anion gap: 8 (ref 5–15)
BUN: 16 mg/dL (ref 8–23)
CO2: 26 mmol/L (ref 22–32)
Calcium: 9.7 mg/dL (ref 8.9–10.3)
Chloride: 102 mmol/L (ref 98–111)
Creatinine, Ser: 0.97 mg/dL (ref 0.44–1.00)
GFR, Estimated: 56 mL/min — ABNORMAL LOW (ref >60)
Glucose, Bld: 153 mg/dL — ABNORMAL HIGH (ref 70–99)
Potassium: 3.7 mmol/L (ref 3.5–5.1)
Sodium: 136 mmol/L (ref 135–145)
Total Bilirubin: 0.7 mg/dL (ref 0.3–1.2)
Total Protein: 7.6 g/dL (ref 6.5–8.1)

## 2022-01-06 LAB — CBC WITH DIFFERENTIAL/PLATELET
Abs Immature Granulocytes: 0.02 10*3/uL (ref 0.00–0.07)
Basophils Absolute: 0 10*3/uL (ref 0.0–0.1)
Basophils Relative: 1 %
Eosinophils Absolute: 0.4 10*3/uL (ref 0.0–0.5)
Eosinophils Relative: 6 %
HCT: 31.8 % — ABNORMAL LOW (ref 36.0–46.0)
Hemoglobin: 10.6 g/dL — ABNORMAL LOW (ref 12.0–15.0)
Immature Granulocytes: 0 %
Lymphocytes Relative: 25 %
Lymphs Abs: 1.7 10*3/uL (ref 0.7–4.0)
MCH: 29.2 pg (ref 26.0–34.0)
MCHC: 33.3 g/dL (ref 30.0–36.0)
MCV: 87.6 fL (ref 80.0–100.0)
Monocytes Absolute: 0.7 10*3/uL (ref 0.1–1.0)
Monocytes Relative: 11 %
Neutro Abs: 3.7 10*3/uL (ref 1.7–7.7)
Neutrophils Relative %: 57 %
Platelets: 227 10*3/uL (ref 150–400)
RBC: 3.63 MIL/uL — ABNORMAL LOW (ref 3.87–5.11)
RDW: 14.3 % (ref 11.5–15.5)
WBC: 6.5 10*3/uL (ref 4.0–10.5)
nRBC: 0 % (ref 0.0–0.2)

## 2022-01-06 LAB — C DIFFICILE QUICK SCREEN W PCR REFLEX
C Diff antigen: NEGATIVE
C Diff interpretation: NOT DETECTED
C Diff toxin: NEGATIVE

## 2022-01-06 LAB — MAGNESIUM: Magnesium: 1.4 mg/dL — ABNORMAL LOW (ref 1.7–2.4)

## 2022-01-06 MED ORDER — DEXAMETHASONE SODIUM PHOSPHATE 10 MG/ML IJ SOLN
4.0000 mg | Freq: Once | INTRAMUSCULAR | Status: AC
  Administered 2022-01-06: 4 mg via INTRAVENOUS
  Filled 2022-01-06: qty 1

## 2022-01-06 MED ORDER — MAGNESIUM OXIDE -MG SUPPLEMENT 400 (240 MG) MG PO TABS: 400.0000 mg | ORAL_TABLET | Freq: Every day | ORAL | 0 refills | Status: DC

## 2022-01-06 MED ORDER — SODIUM CHLORIDE 0.9 % IV SOLN
INTRAVENOUS | Status: DC
  Filled 2022-01-06 (×2): qty 250

## 2022-01-06 MED ORDER — DIPHENOXYLATE-ATROPINE 2.5-0.025 MG PO TABS: 1.0000 | ORAL_TABLET | Freq: Four times a day (QID) | ORAL | 0 refills | Status: DC | PRN

## 2022-01-06 MED ORDER — MAGNESIUM SULFATE 2 GM/50ML IV SOLN
2.0000 g | Freq: Once | INTRAVENOUS | Status: AC
  Administered 2022-01-06: 2 g via INTRAVENOUS
  Filled 2022-01-06: qty 50

## 2022-01-06 NOTE — Telephone Encounter (Signed)
Spoke with Merrily Pew, NP- recommended pt to be evaluated today for smc- lab. Although she has has had chronic issues with diarrhea, patient is currently on opdivo. I explained to patient that opdivo can cause diarrhea and we need to further work her up for her concerns. She reports 6 loose stools just today alone. Imodium is not helping her diarrhea. She will pick up her lomotil script. She thanked me for calling her. Pt will come at 1:15 pm today  Labs- cbc/metc/mag

## 2022-01-06 NOTE — Addendum Note (Signed)
Addended by: Gloris Ham on: 01/06/2022 11:23 AM   Modules accepted: Orders

## 2022-01-06 NOTE — Telephone Encounter (Signed)
Patient called reporting that she continues to have diarrhea 6 times a day and that the Imodium is not working. She states that her bowel movements starts off watery then ends with a small soft formed stool. She states she needs something stronger for the darrhea

## 2022-01-06 NOTE — Progress Notes (Addendum)
Symptom Management and [facility] at [facility] Telephone:([phone] [phone]  Patient Care Team: [name], MD as PCP - General (Internal Medicine) Clent Jacks, RN as Oncology Nurse Navigator Cammie Sickle, MD as Consulting Physician (Oncology)   NAME OF PATIENT: Heather Cooper  [id number]  [birth year]   DATE OF VISIT: 01/06/22  REASON FOR CONSULT: Heather Cooper is a 88 y.o. 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  Patient is on second line Opdivo.  INTERVAL HISTORY: Patient saw Dr. Janese Banks on 12/31/2021 and received cycle 4 Opdivo.  Patient reports about 5 days of watery diarrhea, with average of 6 episodes a day.  No abdominal pain, blood or mucus.  No fever or chills.  No nausea or vomiting.  No other symptomatic complaints.  Denies any neurologic complaints. Denies recent fevers or illnesses. Denies any easy bleeding or bruising. Reports fair appetite and denies weight loss. Denies chest pain. Denies any nausea, vomiting, constipation, or diarrhea. Denies urinary complaints. Patient offers no further specific complaints today.  SOCIAL HISTORY:     reports that she quit smoking about 24 years ago. Her smoking use included cigarettes. She has never used smokeless tobacco. She reports that she does not drink alcohol and does not use drugs.  ADVANCE DIRECTIVES:  None on file  CODE STATUS:    PAST MEDICAL HISTORY: Past Medical History:  Diagnosis Date   Chemotherapy induced nausea and vomiting    Chicken pox    Esophageal cancer (HCC)    Hypercalcemia    familial hypocalciuric hypercalcemia   Hypercholesterolemia    Hypertension    Lung cancer ([place])    Osteoporosis    Thyroid disease     PAST SURGICAL HISTORY:  Past Surgical History:  Procedure Laterality Date   ABDOMINAL HYSTERECTOMY     partial    CHOLECYSTECTOMY     COLONOSCOPY WITH PROPOFOL N/A 04/17/2017   Procedure: COLONOSCOPY WITH PROPOFOL;  Surgeon: Manya Silvas, MD;  Location: [facility] ENDOSCOPY;  Service: Endoscopy;  Laterality: N/A;   ESOPHAGOGASTRODUODENOSCOPY (EGD) WITH PROPOFOL N/A 04/13/2021   Procedure: ESOPHAGOGASTRODUODENOSCOPY (EGD) WITH PROPOFOL;  Surgeon: [name], MD;  Location: [facility] ENDOSCOPY;  Service: Endoscopy;  Laterality: N/A;   EUS N/A 04/22/2021   Procedure: FULL UPPER ENDOSCOPIC ULTRASOUND (EUS) RADIAL;  Surgeon: Jola Schmidt, MD;  Location: [facility] ENDOSCOPY;  Service: Endoscopy;  Laterality: N/A;  [facility] needed   EUS N/A 10/28/2021   Procedure: UPPER ENDOSCOPIC ULTRASOUND (EUS) LINEAR;  Surgeon: Reita Cliche, MD;  Location: [facility] ENDOSCOPY;  Service: Gastroenterology;  Laterality: N/A;  [facility]   transvaginal hysterectomy  04/18/05   with anterior colporrhaphy    HEMATOLOGY/ONCOLOGY HISTORY:  Oncology History Overview Note  # 2015-patient is an 88 year old female with 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  Starting radiation and chemotherapy from April 14, 2014 Patient was started on carboplatinum andTaxol Herve were developed an allergic reaction to Taxol so would be changed to Abraxane 3.patient has finished 6 cycles of weekly chemotherapy with carboplatinum  and radiation therapy(May 28, 2014) 4.started on  NIVOLULAMAB because of persistent disease  July 02, 2014. 5.  NIVOLULAMAB was discontinued because of persistent diarrhea in July of 2016.  August of 2016 CT scan was stable so no further chemotherapy  # AUG 25th PET- STABLE RUL MASS [radiation fibrosis; <1cm ? Mediastinal recurrence];   #  DEC 2022-squamous cell carcinoma of the midesophagus -carbo Abraxane weekly with radiation.   APRIL 16th, 2023- PET scan-  Resolution of metabolic activity in the mid esophagus;  Persistent hypermetabolic activity within a subcarinal lymph node. Differential includes residual carcinoma versus reactive adenopathy.  No evidence of distant metastatic disease.  # MAY 18th, 2023- s/p EUS [Dr.Spaete]-endoscopic biopsy of the mediastinal lymph node. MAY 18th, 2023- Status post endoscopic ultrasound/biopsy of the mediastinal/subcarinal lymph node- BIOPSY POSITIVE FOR RECURRENT SQUAMOUS CELL.  # JUNE 9th, 2023- Opdivo every 2 weeks.   # AAA/ 3.3x 3.9 Stable [Dr.Schnier] -----------------------------------------------------       History of lung cancer  Malignant neoplasm of right upper lobe of lung ([place])  08/07/2019 Initial Diagnosis   Malignant neoplasm of right upper lobe of lung (HCC)     ALLERGIES:  is allergic to paclitaxel.  MEDICATIONS:  Current Outpatient Medications  Medication Sig Dispense Refill   albuterol (VENTOLIN HFA) 108 (90 Base) MCG/ACT inhaler Inhale 2 puffs into the lungs every 6 (six) hours as needed for wheezing or shortness of breath. 8 g 2   cholecalciferol (VITAMIN D3) 25 MCG (1000 UNIT) tablet Take 1,000 Units by mouth daily.     fluticasone (FLONASE) 50 MCG/ACT nasal spray Place 2 sprays into both nostrils daily. 16 g 3   levocetirizine (XYZAL) 5 MG tablet Take 1 tablet (5 mg total) by mouth every evening. 90 tablet 2   olmesartan-hydrochlorothiazide (BENICAR HCT) 20-12.5 MG tablet TAKE 1 TABLET BY MOUTH ONCE DAILY 90 tablet 1   pantoprazole (PROTONIX) 40 MG tablet TAKE 1 TABLET BY MOUTH ONCE DAILY 90 tablet 3   diphenoxylate-atropine (LOMOTIL) 2.5-0.025 MG tablet Take 1 tablet by mouth 4 (four) times daily as needed for diarrhea or loose stools. Take it along with immodium 30 tablet 0   No current facility-administered medications for this visit.    Facility-Administered Medications Ordered in Other Visits  Medication Dose Route Frequency Provider Last Rate Last Admin   sodium chloride 0.9 % 1,000 mL with potassium chloride 20 mEq, magnesium sulfate 2 g infusion   Intravenous Continuous Choksi, Janak, MD   Stopped at 12/04/14 1300    VITAL SIGNS: BP 124/68   Pulse 93   Temp 98.1 F (36.7 C) (Oral)   Resp 16   SpO2 100%  There were no vitals filed for this visit.  Estimated body mass index is 23.08 kg/m as calculated from the following:   Height as of 12/17/21: 5' 3"  (1.6 m).   Weight as of 12/31/21: 130 lb 4.8 oz (59.1 kg).  LABS: CBC:    Component Value Date/Time   WBC 6.5 01/06/2022 1244   HGB 10.6 (L) 01/06/2022 1244   HGB 10.5 (L) 10/09/2014 0846   HCT 31.8 (L) 01/06/2022 1244   HCT 30.7 (L) 10/09/2014 0846   PLT 227 01/06/2022 1244   PLT 218 10/09/2014 0846   MCV 87.6 01/06/2022 1244   MCV 85 10/09/2014 0846   NEUTROABS 3.7 01/06/2022 1244   NEUTROABS 9.7 (H) 10/09/2014 0846   LYMPHSABS 1.7 01/06/2022 1244   LYMPHSABS 0.5 (L) 10/09/2014 0846   MONOABS 0.7 01/06/2022 1244   MONOABS 0.8 10/09/2014 0846   EOSABS 0.4 01/06/2022 1244   EOSABS  0.1 10/09/2014 0846   BASOSABS 0.0 01/06/2022 1244   BASOSABS 0.0 10/09/2014 0846   Comprehensive Metabolic Panel:    Component Value Date/Time   NA 137 12/31/2021 0822   NA 130 (L) 10/09/2014 0846   K 3.9 12/31/2021 0822   K 4.1 10/09/2014 0846   CL 102 12/31/2021 0822   CL 98 (L) 10/09/2014 0846   CO2 29 12/31/2021 0822   CO2 25 10/09/2014 0846   BUN 17 12/31/2021 0822   BUN 14 10/09/2014 0846   CREATININE 0.91 12/31/2021 0822   CREATININE 0.70 10/09/2014 0846   GLUCOSE 206 (H) 12/31/2021 0822   GLUCOSE 161 (H) 10/09/2014 0846   CALCIUM 10.1 12/31/2021 0822   CALCIUM 9.1 10/09/2014 0846   AST 21 12/31/2021 0822   AST 16 10/09/2014 0846   ALT 12 12/31/2021 0822   ALT 13 (L) 10/09/2014 0846   ALKPHOS 76 12/31/2021 0822   ALKPHOS 70 10/09/2014 0846    BILITOT 0.5 12/31/2021 0822   BILITOT 1.0 10/09/2014 0846   PROT 7.5 12/31/2021 0822   PROT 7.3 10/09/2014 0846   ALBUMIN 3.7 12/31/2021 0822   ALBUMIN 3.6 10/09/2014 0846    RADIOGRAPHIC STUDIES: No results found.  PERFORMANCE STATUS (ECOG) : 1 - Symptomatic but completely ambulatory  Review of Systems Unless otherwise noted, a complete review of systems is negative.  Physical Exam General: NAD Cardiovascular: regular rate and rhythm Pulmonary: clear ant fields Abdomen: soft, nontender, + bowel sounds GU: no suprapubic tenderness Extremities: no edema, no joint deformities Skin: no rashes Neurological: Weakness but otherwise nonfocal  IMPRESSION: Diarrhea is concerning for immunotherapy-mediated given timing.  Grade 2 diarrhea.  Vitals stable. Exam is benign.  We will send for stool studies and C. difficile PCR to rule out infectious etiologies.  We will also send fecal calprotectin and lactoferrin.  Labs suggestive of hypomagnesemia.  We will proceed with IV fluids and replete with IV mag.  Patient may utilize antidiarrheals after we rule out infectious etiology.  Discussed with Dr. Grayland Ormond, who was covering for Dr. Rogue Bussing.  We will proceed with low-dose IV dexamethasone today.   PLAN: -Continue current scope of treatment -IV fluids -2 g IV mag -Start oral Mag-Ox -Dexamethasone 4 mg x 1 -Stool studies and C. difficile PCR -Fecal calprotectin and lactoferrin -RTC on Monday for repeat labs/fluids  Case and plan discussed with Dr. Grayland Ormond  Addendum: Stool studies positive for EPEC.  We will start azithromycin 500 mg x 3 days. I called and updated patient. She reports that her diarrhea is improving. She felt good today (01/07/22) and was out getting her hair done in anticipation of a wedding this weekend.   Patient expressed understanding and was in agreement with this plan. She also understands that She can call clinic at any time with any questions, concerns, or  complaints.   Thank you for allowing me to participate in the care of this very pleasant patient.   Time Total: 25 minutes  Visit consisted of counseling and education dealing with the complex and emotionally intense issues of symptom management in the setting of serious illness.Greater than 50%  of this time was spent counseling and coordinating care related to the above assessment and plan.  Signed by: Altha Harm, PhD, NP-C

## 2022-01-06 NOTE — Telephone Encounter (Signed)
That Lomotil was prescribed back in January and she said she used all of that and does not have any. She would like prescription sent to [facility] please

## 2022-01-07 LAB — GASTROINTESTINAL PANEL BY PCR, STOOL (REPLACES STOOL CULTURE)

## 2022-01-07 MED ORDER — AZITHROMYCIN 500 MG PO TABS: 500.0000 mg | ORAL_TABLET | Freq: Every day | ORAL | 0 refills | Status: DC

## 2022-01-07 NOTE — Addendum Note (Signed)
Addended by: Altha Harm R on: 01/07/2022 11:22 AM   Modules accepted: Orders

## 2022-01-08 LAB — CALPROTECTIN, FECAL: Calprotectin, Fecal: 287 ug/g — ABNORMAL HIGH (ref 0–120)

## 2022-01-10 ENCOUNTER — Other Ambulatory Visit: Payer: Self-pay

## 2022-01-10 ENCOUNTER — Inpatient Hospital Stay: Payer: Medicare PPO

## 2022-01-10 DIAGNOSIS — D649 Anemia, unspecified: Secondary | ICD-10-CM | POA: Diagnosis not present

## 2022-01-10 DIAGNOSIS — R197 Diarrhea, unspecified: Secondary | ICD-10-CM | POA: Diagnosis not present

## 2022-01-10 DIAGNOSIS — Z79899 Other long term (current) drug therapy: Secondary | ICD-10-CM | POA: Diagnosis not present

## 2022-01-10 DIAGNOSIS — C781 Secondary malignant neoplasm of mediastinum: Secondary | ICD-10-CM | POA: Diagnosis not present

## 2022-01-10 DIAGNOSIS — D490 Neoplasm of unspecified behavior of digestive system: Secondary | ICD-10-CM

## 2022-01-10 DIAGNOSIS — Z5112 Encounter for antineoplastic immunotherapy: Secondary | ICD-10-CM | POA: Diagnosis not present

## 2022-01-10 DIAGNOSIS — C154 Malignant neoplasm of middle third of esophagus: Secondary | ICD-10-CM | POA: Diagnosis not present

## 2022-01-10 DIAGNOSIS — Z87891 Personal history of nicotine dependence: Secondary | ICD-10-CM | POA: Diagnosis not present

## 2022-01-10 LAB — BASIC METABOLIC PANEL
Anion gap: 8 (ref 5–15)
BUN: 16 mg/dL (ref 8–23)
CO2: 28 mmol/L (ref 22–32)
Calcium: 10.1 mg/dL (ref 8.9–10.3)
Chloride: 99 mmol/L (ref 98–111)
Creatinine, Ser: 0.86 mg/dL (ref 0.44–1.00)
GFR, Estimated: >60 mL/min (ref >60)
Glucose, Bld: 152 mg/dL — ABNORMAL HIGH (ref 70–99)
Potassium: 3.8 mmol/L (ref 3.5–5.1)
Sodium: 135 mmol/L (ref 135–145)

## 2022-01-10 LAB — MAGNESIUM: Magnesium: 1.6 mg/dL — ABNORMAL LOW (ref 1.7–2.4)

## 2022-01-10 NOTE — Progress Notes (Signed)
Returns for labs and possible IV fluids. Pt reports improvement in her stools and the number of stools. States that stools are becoming a little more formed than they previously were. Has had 2 small BM's today. She reports that she has not started MagOx yet, as she was not aware that she had Rx for it. I informed her of new Rx at [facility], and she agreed to pick up the medication and start it today. Per [employer], will recheck serum magnesium at 8/4 appointments. No IV fluids given today.

## 2022-01-11 ENCOUNTER — Telehealth: Payer: Self-pay | Admitting: *Deleted

## 2022-01-11 ENCOUNTER — Other Ambulatory Visit: Payer: Self-pay

## 2022-01-11 NOTE — Telephone Encounter (Signed)
Spoke with pt who stated her diarrhea has improved.  Stool has form to it now and pt states she is drinking a lot of fluids.  Pt verbalized understanding of follow up appointment on Friday, January 14, 2022.

## 2022-01-12 ENCOUNTER — Other Ambulatory Visit: Payer: Self-pay | Admitting: Internal Medicine

## 2022-01-14 ENCOUNTER — Inpatient Hospital Stay: Payer: Medicare PPO

## 2022-01-14 ENCOUNTER — Inpatient Hospital Stay: Payer: Medicare PPO | Attending: Internal Medicine

## 2022-01-14 ENCOUNTER — Encounter: Payer: Self-pay | Admitting: Internal Medicine

## 2022-01-14 ENCOUNTER — Inpatient Hospital Stay (HOSPITAL_BASED_OUTPATIENT_CLINIC_OR_DEPARTMENT_OTHER): Payer: Medicare PPO | Admitting: Internal Medicine

## 2022-01-14 VITALS — BP 105/71 | HR 95 | Temp 98.7°F | Resp 16 | Ht 63.0 in | Wt 128.4 lb

## 2022-01-14 DIAGNOSIS — C3411 Malignant neoplasm of upper lobe, right bronchus or lung: Secondary | ICD-10-CM | POA: Insufficient documentation

## 2022-01-14 DIAGNOSIS — R197 Diarrhea, unspecified: Secondary | ICD-10-CM

## 2022-01-14 DIAGNOSIS — D649 Anemia, unspecified: Secondary | ICD-10-CM | POA: Insufficient documentation

## 2022-01-14 DIAGNOSIS — Z87891 Personal history of nicotine dependence: Secondary | ICD-10-CM | POA: Insufficient documentation

## 2022-01-14 DIAGNOSIS — C154 Malignant neoplasm of middle third of esophagus: Secondary | ICD-10-CM | POA: Diagnosis not present

## 2022-01-14 DIAGNOSIS — D490 Neoplasm of unspecified behavior of digestive system: Secondary | ICD-10-CM

## 2022-01-14 LAB — CBC WITH DIFFERENTIAL/PLATELET
Abs Immature Granulocytes: 0.03 10*3/uL (ref 0.00–0.07)
Basophils Absolute: 0.1 10*3/uL (ref 0.0–0.1)
Basophils Relative: 1 %
Eosinophils Absolute: 0.5 10*3/uL (ref 0.0–0.5)
Eosinophils Relative: 6 %
HCT: 34.7 % — ABNORMAL LOW (ref 36.0–46.0)
Hemoglobin: 11.4 g/dL — ABNORMAL LOW (ref 12.0–15.0)
Immature Granulocytes: 0 %
Lymphocytes Relative: 17 %
Lymphs Abs: 1.5 10*3/uL (ref 0.7–4.0)
MCH: 29.3 pg (ref 26.0–34.0)
MCHC: 32.9 g/dL (ref 30.0–36.0)
MCV: 89.2 fL (ref 80.0–100.0)
Monocytes Absolute: 0.8 10*3/uL (ref 0.1–1.0)
Monocytes Relative: 9 %
Neutro Abs: 6.2 10*3/uL (ref 1.7–7.7)
Neutrophils Relative %: 67 %
Platelets: 269 10*3/uL (ref 150–400)
RBC: 3.89 MIL/uL (ref 3.87–5.11)
RDW: 14.4 % (ref 11.5–15.5)
WBC: 9.2 10*3/uL (ref 4.0–10.5)
nRBC: 0 % (ref 0.0–0.2)

## 2022-01-14 LAB — COMPREHENSIVE METABOLIC PANEL
ALT: 12 U/L (ref 0–44)
AST: 19 U/L (ref 15–41)
Albumin: 3.9 g/dL (ref 3.5–5.0)
Alkaline Phosphatase: 79 U/L (ref 38–126)
Anion gap: 9 (ref 5–15)
BUN: 18 mg/dL (ref 8–23)
CO2: 27 mmol/L (ref 22–32)
Calcium: 9.8 mg/dL (ref 8.9–10.3)
Chloride: 97 mmol/L — ABNORMAL LOW (ref 98–111)
Creatinine, Ser: 0.96 mg/dL (ref 0.44–1.00)
GFR, Estimated: 57 mL/min — ABNORMAL LOW (ref >60)
Glucose, Bld: 181 mg/dL — ABNORMAL HIGH (ref 70–99)
Potassium: 4.2 mmol/L (ref 3.5–5.1)
Sodium: 133 mmol/L — ABNORMAL LOW (ref 135–145)
Total Bilirubin: 0.6 mg/dL (ref 0.3–1.2)
Total Protein: 7.7 g/dL (ref 6.5–8.1)

## 2022-01-14 LAB — MAGNESIUM: Magnesium: 1.7 mg/dL (ref 1.7–2.4)

## 2022-01-14 MED ORDER — SLOW MAGNESIUM/CALCIUM 70-117 MG PO TBEC: 1.0000 | DELAYED_RELEASE_TABLET | Freq: Two times a day (BID) | ORAL | 6 refills | Status: DC

## 2022-01-14 MED ORDER — DIPHENOXYLATE-ATROPINE 2.5-0.025 MG PO TABS: 1.0000 | ORAL_TABLET | Freq: Four times a day (QID) | ORAL | 0 refills | Status: DC | PRN

## 2022-01-14 MED ORDER — PREDNISONE 20 MG PO TABS: ORAL_TABLET | ORAL | 0 refills | Status: DC

## 2022-01-14 NOTE — Progress Notes (Signed)
Patient recently treated for E coli infection in stool.  Diarrhea has subsided but not resolved.  Taking Lomotil on scheduled basis tid for the past 3 days.  Had 3 small loose bowel movements this morning.    Bp today 95/62, 105/71 HR 95.  Did take bp med this morning.

## 2022-01-14 NOTE — Progress Notes (Signed)
Nutrition  RD planning to see patient during infusion but infusion cancelled.  Rescheduled to see in infusion on 8/18  Heather Cooper, [place], [place] Registered Dietitian [phone]

## 2022-01-14 NOTE — Progress Notes (Addendum)
error 

## 2022-01-14 NOTE — Progress Notes (Signed)
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  Starting radiation and chemotherapy from April 14, 2014 Patient was started on carboplatinum andTaxol Herve were developed an allergic reaction to Taxol so would be changed to Abraxane 3.patient has finished 6 cycles of weekly chemotherapy with carboplatinum  and radiation therapy(May 28, 2014) 4.started on  NIVOLULAMAB because of persistent disease July 02, 2014. 5.  NIVOLULAMAB was discontinued because of persistent diarrhea in July of 2016.  August of 2016 CT scan was stable so no further chemotherapy  # AUG 25th PET- STABLE RUL MASS [radiation fibrosis; <1cm ? 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   APRIL 16th, 2023- PET scan-  Resolution of metabolic activity in the mid esophagus;  Persistent hypermetabolic activity within a subcarinal lymph node. Differential includes residual carcinoma versus reactive adenopathy.  No evidence of distant metastatic disease.  # MAY 18th, 2023- s/p EUS  [Dr.Spaete]-endoscopic biopsy of the mediastinal lymph node. MAY 18th, 2023- Status post endoscopic ultrasound/biopsy of the mediastinal/subcarinal lymph node- BIOPSY POSITIVE FOR RECURRENT SQUAMOUS CELL.  # JUNE 9th, 2023- Opdivo every 2 weeks.   # AAA/ 3.3x 3.9 Stable [Dr.Schnier] -----------------------------------------------------       History of lung cancer  Malignant neoplasm of right upper lobe of lung ([place])  08/07/2019 Initial Diagnosis   Malignant neoplasm of right upper lobe of lung ([place])    INTERVAL HISTORY: Ambulating with a rolling walker.  Accompanied by her daughter Jakeira Seeman 88 y.o.  female pleasant patient  diagnosed squamous cell carcinoma of the midesophagus-with recurrent disease in the mediastinum currently on OPDIVO is here for a follow up.  In the interim patient was evaluated in the management clinic for diarrhea positive for E. coli infection.  Treated with antibiotics.-Azithromycin for 3 days.  Patient's diarrhea seemed improved.  However, she is currently having diarrhea 4-5 times a day.  Mild cramping.  No nausea no vomiting.  She has been hydrating herself.  Notes have improvement of her difficulty swallowing.    Review of Systems  Constitutional:  Positive for malaise/fatigue. Negative for chills, diaphoresis, fever and weight loss.  HENT:  Negative for nosebleeds and sore throat.   Eyes:  Negative for double vision.  Respiratory:  Negative for hemoptysis, sputum production, shortness of breath and wheezing.   Cardiovascular:  Negative for chest pain, palpitations, orthopnea and leg swelling.  Gastrointestinal:  Negative for abdominal pain, blood in stool, constipation, diarrhea, heartburn, melena, nausea and vomiting.  Genitourinary:  Negative for dysuria, frequency and urgency.  Musculoskeletal:  Negative for back pain and joint pain.  Skin: Negative.  Negative for itching and rash.  Neurological:  Negative for dizziness, tingling,  focal weakness, weakness and headaches.  Endo/Heme/Allergies:  Does not bruise/bleed easily.  Psychiatric/Behavioral:  Negative for depression. The patient is not nervous/anxious and does not have insomnia.      PAST MEDICAL HISTORY :  Past Medical History:  Diagnosis Date   Chemotherapy induced nausea and vomiting    Chicken pox    Esophageal cancer (HCC)    Hypercalcemia    familial hypocalciuric hypercalcemia   Hypercholesterolemia    Hypertension    Lung cancer ([place])    Osteoporosis    Thyroid disease     PAST SURGICAL HISTORY :   Past Surgical History:  Procedure Laterality Date   ABDOMINAL HYSTERECTOMY     partial   CHOLECYSTECTOMY     COLONOSCOPY WITH PROPOFOL N/A 04/17/2017   Procedure: COLONOSCOPY WITH PROPOFOL;  Surgeon: Manya Silvas, MD;  Location: [facility] ENDOSCOPY;  Service: Endoscopy;  Laterality: N/A;   ESOPHAGOGASTRODUODENOSCOPY (EGD) WITH PROPOFOL N/A 04/13/2021   Procedure: ESOPHAGOGASTRODUODENOSCOPY (EGD) WITH PROPOFOL;  Surgeon: [name], MD;  Location: [facility] ENDOSCOPY;  Service: Endoscopy;  Laterality: N/A;   EUS N/A 04/22/2021   Procedure: FULL UPPER ENDOSCOPIC ULTRASOUND (EUS) RADIAL;  Surgeon: Jola Schmidt, MD;  Location: [facility] ENDOSCOPY;  Service: Endoscopy;  Laterality: N/A;  [facility] needed   EUS N/A 10/28/2021   Procedure: UPPER ENDOSCOPIC ULTRASOUND (EUS) LINEAR;  Surgeon: Reita Cliche, MD;  Location: [facility] ENDOSCOPY;  Service: Gastroenterology;  Laterality: N/A;  [facility]   transvaginal hysterectomy  04/18/05   with anterior colporrhaphy    FAMILY HISTORY :   Family History  Problem Relation Age of Onset   Stroke Mother    Hypertension Mother    Prostate cancer Father    Cancer Father        prostate   Heart disease Brother        s/p CABG   Colon cancer Neg Hx     SOCIAL HISTORY:   Social History   Tobacco Use   Smoking status: Former    Types: Cigarettes    Quit date: 06/13/1997    Years since quitting: 24.6   Smokeless  tobacco: Never  Vaping Use   Vaping Use: Never used  Substance Use Topics   Alcohol use: No    Alcohol/week: 0.0 standard drinks of alcohol   Drug use: No    ALLERGIES:  is allergic to paclitaxel.  MEDICATIONS:  Current Outpatient Medications  Medication Sig Dispense Refill   albuterol (VENTOLIN HFA) 108 (90 Base) MCG/ACT inhaler Inhale 2 puffs into the lungs every 6 (six) hours as needed for wheezing or shortness of breath. 8 g 2   cholecalciferol (VITAMIN D3) 25 MCG (1000 UNIT) tablet Take 1,000 Units by mouth daily.     fluticasone (FLONASE) 50 MCG/ACT nasal spray Place 2 sprays into both nostrils daily. 16 g 3   levocetirizine (XYZAL) 5 MG tablet Take 1 tablet (5 mg total) by mouth every evening. 90 tablet 2   Magnesium Cl-Calcium Carbonate (SLOW MAGNESIUM/CALCIUM) 70-117 MG TBEC Take 1 tablet by mouth 2 (two) times daily. 60 tablet 6   olmesartan-hydrochlorothiazide (BENICAR HCT) 20-12.5 MG tablet TAKE 1 TABLET BY MOUTH ONCE DAILY 90 tablet 1   pantoprazole (PROTONIX) 40 MG tablet TAKE 1 TABLET BY MOUTH ONCE DAILY 90 tablet 3   predniSONE (DELTASONE) 20 MG tablet 1 pill /day x1 week; and then half a pill -do not stop until next visit. [place]  tablet 0   azithromycin (ZITHROMAX) 500 MG tablet Take 1 tablet (500 mg total) by mouth daily. (Patient not taking: Reported on 01/14/2022) 3 tablet 0   diphenoxylate-atropine (LOMOTIL) 2.5-0.025 MG tablet Take 1 tablet by mouth 4 (four) times daily as needed for diarrhea or loose stools. Take it along with immodium 60 tablet 0   No current facility-administered medications for this visit.   Facility-Administered Medications Ordered in Other Visits  Medication Dose Route Frequency Provider Last Rate Last Admin   sodium chloride 0.9 % 1,000 mL with potassium chloride 20 mEq, magnesium sulfate 2 g infusion   Intravenous Continuous Forest Gleason, MD   Stopped at 12/04/14 1300    PHYSICAL EXAMINATION: ECOG PERFORMANCE STATUS: 0 - Asymptomatic  BP  105/71 (BP Location: Left Arm, Patient Position: Sitting)   Pulse 95   Temp 98.7 F (37.1 C) (Tympanic)   Resp 16   Ht 5' 3"  (1.6 m)   Wt 128 lb 6.4 oz (58.2 kg)   BMI 22.75 kg/m   Filed Weights   01/14/22 0800  Weight: 128 lb 6.4 oz (58.2 kg)      Physical Exam HENT:     Head: Normocephalic and atraumatic.     Mouth/Throat:     Pharynx: No oropharyngeal exudate.  Eyes:     Pupils: Pupils are equal, round, and reactive to light.  Cardiovascular:     Rate and Rhythm: Normal rate and regular rhythm.  Pulmonary:     Effort: Pulmonary effort is normal. No respiratory distress.     Breath sounds: Normal breath sounds. No wheezing.  Abdominal:     General: Bowel sounds are normal. There is no distension.     Palpations: Abdomen is soft. There is no mass.     Tenderness: There is no abdominal tenderness. There is no guarding or rebound.  Musculoskeletal:        General: No tenderness. Normal range of motion.     Cervical back: Normal range of motion and neck supple.  Skin:    General: Skin is warm.  Neurological:     Mental Status: She is alert and oriented to person, place, and time.  Psychiatric:        Mood and Affect: Affect normal.      LABORATORY DATA:  I have reviewed the data as listed    Component Value Date/Time   NA 133 (L) 01/14/2022 0839   NA 130 (L) 10/09/2014 0846   K 4.2 01/14/2022 0839   K 4.1 10/09/2014 0846   CL 97 (L) 01/14/2022 0839   CL 98 (L) 10/09/2014 0846   CO2 27 01/14/2022 0839   CO2 25 10/09/2014 0846   GLUCOSE 181 (H) 01/14/2022 0839   GLUCOSE 161 (H) 10/09/2014 0846   BUN 18 01/14/2022 0839   BUN 14 10/09/2014 0846   CREATININE 0.96 01/14/2022 0839   CREATININE 0.70 10/09/2014 0846   CALCIUM 9.8 01/14/2022 0839   CALCIUM 9.1 10/09/2014 0846   PROT 7.7 01/14/2022 0839   PROT 7.3 10/09/2014 0846   ALBUMIN 3.9 01/14/2022 0839   ALBUMIN 3.6 10/09/2014 0846   AST 19 01/14/2022 0839   AST 16 10/09/2014 0846   ALT 12 01/14/2022  0839   ALT 13 (L) 10/09/2014 0846   ALKPHOS 79 01/14/2022 0839   ALKPHOS 70 10/09/2014 0846   BILITOT 0.6 01/14/2022 0839   BILITOT 1.0 10/09/2014 0846   GFRNONAA 57 (L) 01/14/2022 0839   GFRNONAA >60 10/09/2014 0846   GFRAA >  60 02/05/2020 1004   GFRAA >60 10/09/2014 0846    No results found for: "SPEP", "UPEP"  Lab Results  Component Value Date   WBC 9.2 01/14/2022   NEUTROABS 6.2 01/14/2022   HGB 11.4 (L) 01/14/2022   HCT 34.7 (L) 01/14/2022   MCV 89.2 01/14/2022   PLT 269 01/14/2022      Chemistry      Component Value Date/Time   NA 133 (L) 01/14/2022 0839   NA 130 (L) 10/09/2014 0846   K 4.2 01/14/2022 0839   K 4.1 10/09/2014 0846   CL 97 (L) 01/14/2022 0839   CL 98 (L) 10/09/2014 0846   CO2 27 01/14/2022 0839   CO2 25 10/09/2014 0846   BUN 18 01/14/2022 0839   BUN 14 10/09/2014 0846   CREATININE 0.96 01/14/2022 0839   CREATININE 0.70 10/09/2014 0846   GLU 111 03/18/2014 1048      Component Value Date/Time   CALCIUM 9.8 01/14/2022 0839   CALCIUM 9.1 10/09/2014 0846   ALKPHOS 79 01/14/2022 0839   ALKPHOS 70 10/09/2014 0846   AST 19 01/14/2022 0839   AST 16 10/09/2014 0846   ALT 12 01/14/2022 0839   ALT 13 (L) 10/09/2014 0846   BILITOT 0.6 01/14/2022 0839   BILITOT 1.0 10/09/2014 0846       RADIOGRAPHIC STUDIES: I have personally reviewed the radiological images as listed and agreed with the findings in the report. No results found.    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  APRIL 16th, 2023- PET scan-  Resolution of metabolic activity in the mid esophagus; however persistent hypermetabolic activity within a subcarinal lymph node. MAY 18th, 2023- Status post endoscopic ultrasound/biopsy of the mediastinal/subcarinal lymph node- BIOPSY POSITIVE FOR RECURRENT SQUAMOUS CELL. [JUNE 9th,  2023]- OPDIVO q 2 W.   #Proceed with Opdivo every 2 weeks x 4 cycles-  Labs today reviewed;  acceptable ; BUT HOLD off treatment today -given the ongoing diarrhea.  Will order PET scan today.   # Diarrhea- G-2-?  Related to E. coli versus Opdivo.  S/p azithromycin 3 days.  Start prednisone 20 mg/day x1 week; and then 10 mg/day x 1 week.  Recommend probiotics.  # Hypomagnesemia- [jlu 2023]-due to magnesium 1.7; recommend Slow-Mag.   # Cough-? Sec to reflux; continue allergy medications/PPI; remote hx of smoking- contine Albuterol prn. On cough meds per Dr.Scott. STABLE   # Mild  Anemia- improved- Hb 10-11- sec to chemo-STABLE.   # PBF-BG- 188 overall stable.  Monitor for now [Gluerna protein shakes]-reminded of caution with steroids.  #Hx of goitre-/ TSH low-however normal free T3-T4- .currently on surveillance s/p evaluation with  Dr. Forde Dandy, [facility] endocrinology.  Will monitor.  # DISPOSITION:   # HOLD OPDIVO today  # follow up in 2 weeks; - MD; labs- cbc/cmp;OPDIVO; PET prior- Dr.B       Orders Placed This Encounter  Procedures   NM PET Image Restage (PS) Skull Base to Thigh (F-18 FDG)    Standing Status:   Future    Standing Expiration Date:   01/15/2023    Order Specific Question:   If indicated for the ordered procedure, I authorize the administration of a radiopharmaceutical per Radiology protocol    Answer:   Yes    Order Specific Question:   Preferred imaging location?    Answer:  [facility]    All questions were answered. The patient knows to call the clinic with any problems, questions or concerns.      Cammie Sickle, MD 01/14/2022 9:33 AM

## 2022-01-14 NOTE — Assessment & Plan Note (Signed)
#  Squamous cell carcinoma of the mid-esophagus-NOV 2022-PET scan  TxN1M0 [locally advanced;  s/p  concurrent chemoradiation  [finished Jan 25 th, 2023].  APRIL 16th, 2023- PET scan-  Resolution of metabolic activity in the mid esophagus; however persistent hypermetabolic activity within a subcarinal lymph node. MAY 18th, 2023- Status post endoscopic ultrasound/biopsy of the mediastinal/subcarinal lymph node- BIOPSY POSITIVE FOR RECURRENT SQUAMOUS CELL. [JUNE 9th, 2023]- OPDIVO q 2 W.   #Proceed with Opdivo every 2 weeks x 4 cycles-  Labs today reviewed;  acceptable ; BUT HOLD off treatment today -given the ongoing diarrhea.  Will order PET scan today.   # Diarrhea- G-2-?  Related to E. coli versus Opdivo.  S/p azithromycin 3 days.  Start prednisone 20 mg/day x1 week; and then 10 mg/day x 1 week.  Recommend probiotics.  # Hypomagnesemia- [jlu 2023]-due to magnesium 1.7; recommend Slow-Mag.   # Cough-? Sec to reflux; continue allergy medications/PPI; remote hx of smoking- contine Albuterol prn. On cough meds per Dr.Scott. STABLE   # Mild  Anemia- improved- Hb 10-11- sec to chemo-STABLE.   # PBF-BG- 188 overall stable.  Monitor for now [Gluerna protein shakes]-reminded of caution with steroids.  #Hx of goitre-/ TSH low-however normal free T3-T4- .currently on surveillance s/p evaluation with  Dr. Forde Dandy, [facility] endocrinology.  Will monitor.  # DISPOSITION:   # HOLD OPDIVO today  # follow up in 2 weeks; - MD; labs- cbc/cmp;OPDIVO; PET prior- Dr.B

## 2022-01-19 ENCOUNTER — Inpatient Hospital Stay: Payer: Medicare PPO

## 2022-01-19 ENCOUNTER — Inpatient Hospital Stay: Payer: Medicare PPO | Admitting: Medical Oncology

## 2022-01-19 ENCOUNTER — Telehealth: Payer: Self-pay | Admitting: *Deleted

## 2022-01-19 DIAGNOSIS — R197 Diarrhea, unspecified: Secondary | ICD-10-CM

## 2022-01-19 NOTE — Telephone Encounter (Signed)
Patient called reporting that she continues to have diarrhea. The amount she goes is less quantity, but she is still having small amts nearly every hour. She said she gets cramps after she eats something and then will have loose stool. She is still on the prednisone an dis taking the probiotics as instructed as well as lomotil. Please advise if anything else needs to be done

## 2022-01-19 NOTE — Telephone Encounter (Signed)
Pamala Hurry please schedule patient this Friday for labs/[facility]/poss IVF.

## 2022-01-19 NOTE — Telephone Encounter (Signed)
Please advise patient that Dr. B recommends she take 2 of the Prednisone a day for 1 week.  Will schedule her f/u with [facility]/labs/poss IVF this Friday.

## 2022-01-19 NOTE — Telephone Encounter (Signed)
Call returned to patient and advised per Dr B to take 2 of her prednisone a day for 7 days, She repeated this back to me. I also onformed her that he wants her to come Friday for lab, be seen by [facility], and possibly get IV fluids and to expect a call from scheduler with a time for Friday. She thanked me and will await a call from scheduler

## 2022-01-20 ENCOUNTER — Other Ambulatory Visit (HOSPITAL_COMMUNITY): Payer: Medicare PPO

## 2022-01-21 ENCOUNTER — Inpatient Hospital Stay: Payer: Medicare PPO

## 2022-01-21 ENCOUNTER — Other Ambulatory Visit: Payer: Self-pay | Admitting: *Deleted

## 2022-01-21 ENCOUNTER — Encounter: Payer: Self-pay | Admitting: Hospice and Palliative Medicine

## 2022-01-21 ENCOUNTER — Other Ambulatory Visit: Payer: Self-pay

## 2022-01-21 ENCOUNTER — Inpatient Hospital Stay (HOSPITAL_BASED_OUTPATIENT_CLINIC_OR_DEPARTMENT_OTHER): Payer: Medicare PPO | Admitting: Hospice and Palliative Medicine

## 2022-01-21 VITALS — BP 129/53 | HR 84 | Temp 98.4°F | Resp 18

## 2022-01-21 DIAGNOSIS — C154 Malignant neoplasm of middle third of esophagus: Secondary | ICD-10-CM | POA: Diagnosis not present

## 2022-01-21 DIAGNOSIS — D649 Anemia, unspecified: Secondary | ICD-10-CM | POA: Diagnosis not present

## 2022-01-21 DIAGNOSIS — Z87891 Personal history of nicotine dependence: Secondary | ICD-10-CM | POA: Diagnosis not present

## 2022-01-21 DIAGNOSIS — D490 Neoplasm of unspecified behavior of digestive system: Secondary | ICD-10-CM

## 2022-01-21 DIAGNOSIS — R197 Diarrhea, unspecified: Secondary | ICD-10-CM

## 2022-01-21 DIAGNOSIS — A09 Infectious gastroenteritis and colitis, unspecified: Secondary | ICD-10-CM | POA: Diagnosis not present

## 2022-01-21 DIAGNOSIS — E860 Dehydration: Secondary | ICD-10-CM

## 2022-01-21 DIAGNOSIS — C3411 Malignant neoplasm of upper lobe, right bronchus or lung: Secondary | ICD-10-CM | POA: Diagnosis not present

## 2022-01-21 LAB — CBC WITH DIFFERENTIAL/PLATELET
Abs Immature Granulocytes: 0.05 10*3/uL (ref 0.00–0.07)
Basophils Absolute: 0 10*3/uL (ref 0.0–0.1)
Basophils Relative: 0 %
Eosinophils Absolute: 0.1 10*3/uL (ref 0.0–0.5)
Eosinophils Relative: 1 %
HCT: 34.8 % — ABNORMAL LOW (ref 36.0–46.0)
Hemoglobin: 11.6 g/dL — ABNORMAL LOW (ref 12.0–15.0)
Immature Granulocytes: 1 %
Lymphocytes Relative: 15 %
Lymphs Abs: 1.6 10*3/uL (ref 0.7–4.0)
MCH: 29.2 pg (ref 26.0–34.0)
MCHC: 33.3 g/dL (ref 30.0–36.0)
MCV: 87.7 fL (ref 80.0–100.0)
Monocytes Absolute: 1.5 10*3/uL — ABNORMAL HIGH (ref 0.1–1.0)
Monocytes Relative: 14 %
Neutro Abs: 7.6 10*3/uL (ref 1.7–7.7)
Neutrophils Relative %: 69 %
Platelets: 267 10*3/uL (ref 150–400)
RBC: 3.97 MIL/uL (ref 3.87–5.11)
RDW: 14.4 % (ref 11.5–15.5)
WBC: 10.8 10*3/uL — ABNORMAL HIGH (ref 4.0–10.5)
nRBC: 0 % (ref 0.0–0.2)

## 2022-01-21 LAB — COMPREHENSIVE METABOLIC PANEL
ALT: 14 U/L (ref 0–44)
AST: 17 U/L (ref 15–41)
Albumin: 3.6 g/dL (ref 3.5–5.0)
Alkaline Phosphatase: 58 U/L (ref 38–126)
Anion gap: 12 (ref 5–15)
BUN: 17 mg/dL (ref 8–23)
CO2: 28 mmol/L (ref 22–32)
Calcium: 9.9 mg/dL (ref 8.9–10.3)
Chloride: 93 mmol/L — ABNORMAL LOW (ref 98–111)
Creatinine, Ser: 0.95 mg/dL (ref 0.44–1.00)
GFR, Estimated: 57 mL/min — ABNORMAL LOW (ref >60)
Glucose, Bld: 194 mg/dL — ABNORMAL HIGH (ref 70–99)
Potassium: 3.7 mmol/L (ref 3.5–5.1)
Sodium: 133 mmol/L — ABNORMAL LOW (ref 135–145)
Total Bilirubin: 0.3 mg/dL (ref 0.3–1.2)
Total Protein: 7.1 g/dL (ref 6.5–8.1)

## 2022-01-21 LAB — MAGNESIUM: Magnesium: 1.5 mg/dL — ABNORMAL LOW (ref 1.7–2.4)

## 2022-01-21 MED ORDER — SODIUM CHLORIDE 0.9 % IV SOLN
INTRAVENOUS | Status: DC
  Filled 2022-01-21 (×2): qty 250

## 2022-01-21 MED ORDER — PREDNISONE 20 MG PO TABS: ORAL_TABLET | ORAL | 0 refills | Status: DC

## 2022-01-21 MED ORDER — DEXAMETHASONE SODIUM PHOSPHATE 10 MG/ML IJ SOLN
4.0000 mg | Freq: Once | INTRAMUSCULAR | Status: AC
  Administered 2022-01-21: 4 mg via INTRAVENOUS
  Filled 2022-01-21: qty 1

## 2022-01-21 MED ORDER — MAGNESIUM SULFATE 2 GM/50ML IV SOLN
2.0000 g | Freq: Once | INTRAVENOUS | Status: AC
  Administered 2022-01-21: 2 g via INTRAVENOUS
  Filled 2022-01-21: qty 50

## 2022-01-21 NOTE — Progress Notes (Unsigned)
Pt sat in bathroom and attempted to produce a stool sample at completion of fluids. Attempted x 2 unsuccessfully. Sent pt home with supplies and spoke to daughter on phone regarding this stool specimen. Okay to collect Sunday evening , instructed to keep on ice or refrigeration. Discharged to home via wheelchair.

## 2022-01-21 NOTE — Progress Notes (Signed)
Symptom Management and [place] at [facility] [phone]) [phone] [phone]  Patient Care Team: [name], MD as PCP - General (Internal Medicine) Clent Jacks, RN as Oncology Nurse Navigator Cammie Sickle, MD as Consulting Physician (Oncology)   NAME OF PATIENT: Heather Cooper  [id number]  1931-03-29   DATE OF VISIT: 01/21/22  REASON FOR CONSULT: Heather Cooper is a 88 y.o. 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  Patient is on second line Opdivo.  INTERVAL HISTORY: 12/31/2021 patient received cycle 4 Opdivo.  She subsequently developed persistent diarrhea.  Stool studies revealed a EPEC.  Patient courses of azithromycin.  Fecal lactoferrin and calprotectin were both elevated suggestive of immunotherapy induced colitis.  Patient has been started on prednisone but has not yet had improvement in diarrhea.  She has continued to take Imodium.  Denies any neurologic complaints. Denies recent fevers or illnesses. Denies any easy bleeding or bruising. Reports fair appetite and denies weight loss. Denies chest pain. Denies any nausea, vomiting, constipation, or diarrhea. Denies urinary complaints. Patient offers no further specific complaints today.  SOCIAL HISTORY:     reports that she quit smoking about 24 years ago. Her smoking use included cigarettes. She has never used smokeless tobacco. She reports that she does not drink alcohol and does not use drugs.  ADVANCE DIRECTIVES:  None on file  CODE STATUS:    PAST MEDICAL HISTORY: Past Medical History:  Diagnosis Date   Chemotherapy induced nausea and vomiting    Chicken pox    Esophageal cancer (HCC)    Hypercalcemia    familial hypocalciuric hypercalcemia   Hypercholesterolemia    Hypertension    Lung cancer ([place])    Osteoporosis    Thyroid disease     PAST  SURGICAL HISTORY:  Past Surgical History:  Procedure Laterality Date   ABDOMINAL HYSTERECTOMY     partial   CHOLECYSTECTOMY     COLONOSCOPY WITH PROPOFOL N/A 04/17/2017   Procedure: COLONOSCOPY WITH PROPOFOL;  Surgeon: Manya Silvas, MD;  Location: [facility] ENDOSCOPY;  Service: Endoscopy;  Laterality: N/A;   ESOPHAGOGASTRODUODENOSCOPY (EGD) WITH PROPOFOL N/A 04/13/2021   Procedure: ESOPHAGOGASTRODUODENOSCOPY (EGD) WITH PROPOFOL;  Surgeon: [name], MD;  Location: [facility] ENDOSCOPY;  Service: Endoscopy;  Laterality: N/A;   EUS N/A 04/22/2021   Procedure: FULL UPPER ENDOSCOPIC ULTRASOUND (EUS) RADIAL;  Surgeon: Jola Schmidt, MD;  Location: [facility] ENDOSCOPY;  Service: Endoscopy;  Laterality: N/A;  [facility] needed   EUS N/A 10/28/2021   Procedure: UPPER ENDOSCOPIC ULTRASOUND (EUS) LINEAR;  Surgeon: Reita Cliche, MD;  Location: [facility] ENDOSCOPY;  Service: Gastroenterology;  Laterality: N/A;  [facility]   transvaginal hysterectomy  04/18/05   with anterior colporrhaphy    HEMATOLOGY/ONCOLOGY HISTORY:  Oncology History Overview Note  # 2015-patient is an 88 year old female with 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  Starting radiation and chemotherapy from April 14, 2014 Patient was started on carboplatinum andTaxol Herve were developed an allergic reaction to Taxol so would be changed to Abraxane 3.patient has finished 6 cycles of weekly chemotherapy with carboplatinum  and radiation therapy(May 28, 2014) 4.started on  NIVOLULAMAB because of persistent disease July 02, 2014. 5.  NIVOLULAMAB was discontinued because of persistent diarrhea in July of 2016.  August of 2016 CT scan was stable so no  further chemotherapy  # AUG 25th PET- STABLE RUL MASS [radiation fibrosis; <1cm ? 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   APRIL 16th, 2023- PET scan-  Resolution of metabolic activity in the mid esophagus;  Persistent hypermetabolic activity within a subcarinal lymph node. Differential includes residual carcinoma versus reactive adenopathy.  No evidence of distant metastatic disease.  # MAY 18th, 2023- s/p EUS [Dr.Spaete]-endoscopic biopsy of the mediastinal lymph node. MAY 18th, 2023- Status post endoscopic ultrasound/biopsy of the mediastinal/subcarinal lymph node- BIOPSY POSITIVE FOR RECURRENT SQUAMOUS CELL.  # JUNE 9th, 2023- Opdivo every 2 weeks.   # AAA/ 3.3x 3.9 Stable [Dr.Schnier] -----------------------------------------------------       History of lung cancer  Malignant neoplasm of right upper lobe of lung ([place])  08/07/2019 Initial Diagnosis   Malignant neoplasm of right upper lobe of lung (HCC)     ALLERGIES:  is allergic to paclitaxel.  MEDICATIONS:  Current Outpatient Medications  Medication Sig Dispense Refill   albuterol (VENTOLIN HFA) 108 (90 Base) MCG/ACT inhaler Inhale 2 puffs into the lungs every 6 (six) hours as needed for wheezing or shortness of breath. 8 g 2   cholecalciferol (VITAMIN D3) 25 MCG (1000 UNIT) tablet Take 1,000 Units by mouth daily.     diphenoxylate-atropine (LOMOTIL) 2.5-0.025 MG tablet Take 1 tablet by mouth 4 (four) times daily as needed for diarrhea or loose stools. Take it along with immodium 60 tablet 0   fluticasone (FLONASE) 50 MCG/ACT nasal spray Place 2 sprays into both nostrils daily. 16 g 3   levocetirizine (XYZAL) 5 MG tablet Take 1 tablet (5 mg total) by mouth every evening. 90 tablet 2   loperamide (IMODIUM) 2 MG capsule Take 2 mg by mouth as needed for diarrhea or loose stools.     Magnesium Cl-Calcium Carbonate (SLOW MAGNESIUM/CALCIUM) 70-117 MG  TBEC Take 1 tablet by mouth 2 (two) times daily. 60 tablet 6   olmesartan-hydrochlorothiazide (BENICAR HCT) 20-12.5 MG tablet TAKE 1 TABLET BY MOUTH ONCE DAILY 90 tablet 1   pantoprazole (PROTONIX) 40 MG tablet TAKE 1 TABLET BY MOUTH ONCE DAILY 90 tablet 3   predniSONE (DELTASONE) 20 MG tablet 1 pill /day x1 week; and then half a pill -do not stop until next visit. 30 tablet 0   No current facility-administered medications for this visit.   Facility-Administered Medications Ordered in Other Visits  Medication Dose Route Frequency Provider Last Rate Last Admin   0.9 %  sodium chloride infusion   Intravenous Continuous Borders, Kirt Boys, NP       dexamethasone (DECADRON) injection 4 mg  4 mg Intravenous Once Borders, Kirt Boys, NP       magnesium sulfate IVPB 2 g 50 mL  2 g Intravenous Once Borders, Vonna Kotyk R, NP       sodium chloride 0.9 % 1,000 mL with potassium chloride 20 mEq, magnesium sulfate 2 g infusion   Intravenous Continuous Choksi, Janak, MD   Stopped at 12/04/14 1300    VITAL SIGNS: BP (!) 129/53   Pulse 84   Temp 98.4 F (36.9 C) (Oral)   Resp 18  There were no vitals filed for this visit.  Estimated body mass index is 22.75 kg/m as calculated from the  following:   Height as of 01/14/22: 5' 3" (1.6 m).   Weight as of 01/14/22: 128 lb 6.4 oz (58.2 kg).  LABS: CBC:    Component Value Date/Time   WBC 10.8 (H) 01/21/2022 0948   HGB 11.6 (L) 01/21/2022 0948   HGB 10.5 (L) 10/09/2014 0846   HCT 34.8 (L) 01/21/2022 0948   HCT 30.7 (L) 10/09/2014 0846   PLT 267 01/21/2022 0948   PLT 218 10/09/2014 0846   MCV 87.7 01/21/2022 0948   MCV 85 10/09/2014 0846   NEUTROABS 7.6 01/21/2022 0948   NEUTROABS 9.7 (H) 10/09/2014 0846   LYMPHSABS 1.6 01/21/2022 0948   LYMPHSABS 0.5 (L) 10/09/2014 0846   MONOABS 1.5 (H) 01/21/2022 0948   MONOABS 0.8 10/09/2014 0846   EOSABS 0.1 01/21/2022 0948   EOSABS 0.1 10/09/2014 0846   BASOSABS 0.0 01/21/2022 0948   BASOSABS 0.0 10/09/2014 0846    Comprehensive Metabolic Panel:    Component Value Date/Time   NA 133 (L) 01/21/2022 0948   NA 130 (L) 10/09/2014 0846   K 3.7 01/21/2022 0948   K 4.1 10/09/2014 0846   CL 93 (L) 01/21/2022 0948   CL 98 (L) 10/09/2014 0846   CO2 28 01/21/2022 0948   CO2 25 10/09/2014 0846   BUN 17 01/21/2022 0948   BUN 14 10/09/2014 0846   CREATININE 0.95 01/21/2022 0948   CREATININE 0.70 10/09/2014 0846   GLUCOSE 194 (H) 01/21/2022 0948   GLUCOSE 161 (H) 10/09/2014 0846   CALCIUM 9.9 01/21/2022 0948   CALCIUM 9.1 10/09/2014 0846   AST 17 01/21/2022 0948   AST 16 10/09/2014 0846   ALT 14 01/21/2022 0948   ALT 13 (L) 10/09/2014 0846   ALKPHOS 58 01/21/2022 0948   ALKPHOS 70 10/09/2014 0846   BILITOT 0.3 01/21/2022 0948   BILITOT 1.0 10/09/2014 0846   PROT 7.1 01/21/2022 0948   PROT 7.3 10/09/2014 0846   ALBUMIN 3.6 01/21/2022 0948   ALBUMIN 3.6 10/09/2014 0846    RADIOGRAPHIC STUDIES: No results found.  PERFORMANCE STATUS (ECOG) : 1 - Symptomatic but completely ambulatory  Review of Systems Unless otherwise noted, a complete review of systems is negative.  Physical Exam General: NAD Cardiovascular: regular rate and rhythm Pulmonary: clear ant fields Abdomen: soft, nontender, + bowel sounds GU: no suprapubic tenderness Extremities: no edema, no joint deformities Skin: no rashes Neurological: Weakness but otherwise nonfocal  IMPRESSION: Patient continues to have persistent diarrhea despite completing course of antibiotic and recent increased dosing of prednisone.  Discussed with Dr. Rogue Bussing and will proceed with IV dexamethasone/fluids/mag in clinic today.  Will refer to GI for consideration of colonoscopy.  Will further increase dose of oral prednisone to 60 mg daily with slow titration.  Continue antidiarrheals.  However, will also repeat stool studies.  Note slow worsening of hyperglycemia likely secondary to steroids.  Can consider starting her on metformin ER.  However,  concern for possible worsening of diarrhea. Will monitor labs again next week.   PLAN: -Continue current scope of treatment -IV fluids -2 g IV mag -Dexamethasone 4 mg IV x 1 -Stool studies and C. difficile PCR -Increase prednisone 60 mg daily with slow titration -Referral to GI -RTC on Monday for repeat labs/fluids  Case and plan discussed with Dr. Rogue Bussing  Patient expressed understanding and was in agreement with this plan. She also understands that She can call clinic at any time with any questions, concerns, or complaints.   Thank you for allowing me to participate  in the care of this very pleasant patient.   Time Total: 20 minutes  Visit consisted of counseling and education dealing with the complex and emotionally intense issues of symptom management in the setting of serious illness.Greater than 50%  of this time was spent counseling and coordinating care related to the above assessment and plan.  Signed by: Altha Harm, PhD, NP-C

## 2022-01-24 ENCOUNTER — Telehealth: Payer: Self-pay | Admitting: *Deleted

## 2022-01-24 ENCOUNTER — Other Ambulatory Visit: Payer: Self-pay | Admitting: *Deleted

## 2022-01-24 ENCOUNTER — Inpatient Hospital Stay: Payer: Medicare PPO

## 2022-01-24 DIAGNOSIS — R197 Diarrhea, unspecified: Secondary | ICD-10-CM | POA: Diagnosis not present

## 2022-01-24 DIAGNOSIS — Z87891 Personal history of nicotine dependence: Secondary | ICD-10-CM | POA: Diagnosis not present

## 2022-01-24 DIAGNOSIS — D649 Anemia, unspecified: Secondary | ICD-10-CM | POA: Diagnosis not present

## 2022-01-24 DIAGNOSIS — C3411 Malignant neoplasm of upper lobe, right bronchus or lung: Secondary | ICD-10-CM | POA: Diagnosis not present

## 2022-01-24 DIAGNOSIS — A09 Infectious gastroenteritis and colitis, unspecified: Secondary | ICD-10-CM

## 2022-01-24 DIAGNOSIS — C154 Malignant neoplasm of middle third of esophagus: Secondary | ICD-10-CM | POA: Diagnosis not present

## 2022-01-24 LAB — CBC WITH DIFFERENTIAL/PLATELET
Abs Immature Granulocytes: 0.19 10*3/uL — ABNORMAL HIGH (ref 0.00–0.07)
Basophils Absolute: 0 10*3/uL (ref 0.0–0.1)
Basophils Relative: 0 %
Eosinophils Absolute: 0 10*3/uL (ref 0.0–0.5)
Eosinophils Relative: 0 %
HCT: 34.6 % — ABNORMAL LOW (ref 36.0–46.0)
Hemoglobin: 11.7 g/dL — ABNORMAL LOW (ref 12.0–15.0)
Immature Granulocytes: 2 %
Lymphocytes Relative: 20 %
Lymphs Abs: 2.5 10*3/uL (ref 0.7–4.0)
MCH: 29.4 pg (ref 26.0–34.0)
MCHC: 33.8 g/dL (ref 30.0–36.0)
MCV: 86.9 fL (ref 80.0–100.0)
Monocytes Absolute: 1.2 10*3/uL — ABNORMAL HIGH (ref 0.1–1.0)
Monocytes Relative: 10 %
Neutro Abs: 8.3 10*3/uL — ABNORMAL HIGH (ref 1.7–7.7)
Neutrophils Relative %: 68 %
Platelets: 299 10*3/uL (ref 150–400)
RBC: 3.98 MIL/uL (ref 3.87–5.11)
RDW: 14.2 % (ref 11.5–15.5)
WBC: 12.2 10*3/uL — ABNORMAL HIGH (ref 4.0–10.5)
nRBC: 0 % (ref 0.0–0.2)

## 2022-01-24 LAB — C DIFFICILE QUICK SCREEN W PCR REFLEX
C Diff antigen: NEGATIVE
C Diff interpretation: NOT DETECTED
C Diff toxin: NEGATIVE

## 2022-01-24 LAB — GASTROINTESTINAL PANEL BY PCR, STOOL (REPLACES STOOL CULTURE)

## 2022-01-24 LAB — MAGNESIUM: Magnesium: 1.7 mg/dL (ref 1.7–2.4)

## 2022-01-24 LAB — COMPREHENSIVE METABOLIC PANEL
ALT: 15 U/L (ref 0–44)
AST: 15 U/L (ref 15–41)
Albumin: 3.7 g/dL (ref 3.5–5.0)
Alkaline Phosphatase: 65 U/L (ref 38–126)
Anion gap: 8 (ref 5–15)
BUN: 25 mg/dL — ABNORMAL HIGH (ref 8–23)
CO2: 28 mmol/L (ref 22–32)
Calcium: 10 mg/dL (ref 8.9–10.3)
Chloride: 95 mmol/L — ABNORMAL LOW (ref 98–111)
Creatinine, Ser: 0.93 mg/dL (ref 0.44–1.00)
GFR, Estimated: 59 mL/min — ABNORMAL LOW (ref >60)
Glucose, Bld: 158 mg/dL — ABNORMAL HIGH (ref 70–99)
Potassium: 3.6 mmol/L (ref 3.5–5.1)
Sodium: 131 mmol/L — ABNORMAL LOW (ref 135–145)
Total Bilirubin: 0.3 mg/dL (ref 0.3–1.2)
Total Protein: 7.3 g/dL (ref 6.5–8.1)

## 2022-01-24 MED ORDER — LORAZEPAM 0.5 MG PO TABS: 0.5000 mg | ORAL_TABLET | Freq: Four times a day (QID) | ORAL | 0 refills | Status: DC | PRN

## 2022-01-24 NOTE — Telephone Encounter (Signed)
Lab called reporting that GI panel shows patient has E Coli .  Contains abnormal data Gastrointestinal Panel by PCR , Stool Order: [id number] Status: Final result    Visible to patient: No (scheduled for 01/24/2022 12:51 PM)    Next appt: 01/26/2022 at 01:30 PM in Radiology ([facility]-PET CT1)    0 Result Notes      Component Ref Range & Units 08:40 2 wk ago  Campylobacter species NOT DETECTED NOT DETECTED  NOT DETECTED   Plesimonas shigelloides NOT DETECTED NOT DETECTED  NOT DETECTED   Salmonella species NOT DETECTED NOT DETECTED  NOT DETECTED   Yersinia enterocolitica NOT DETECTED NOT DETECTED  NOT DETECTED   Vibrio species NOT DETECTED NOT DETECTED  NOT DETECTED   Vibrio cholerae NOT DETECTED NOT DETECTED  NOT DETECTED   Enteroaggregative E coli (EAEC) NOT DETECTED NOT DETECTED  NOT DETECTED   Enteropathogenic E coli (EPEC) NOT DETECTED DETECTED Abnormal   DETECTED Abnormal  CM   Comment: RESULT CALLED TO, READ BACK BY AND VERIFIED WITH:  Jarrett Soho RN 1150 01/24/22 HNM   Enterotoxigenic E coli (ETEC) NOT DETECTED NOT DETECTED  NOT DETECTED   Shiga like toxin producing E coli (STEC) NOT DETECTED NOT DETECTED  NOT DETECTED   Shigella/Enteroinvasive E coli (EIEC) NOT DETECTED NOT DETECTED  NOT DETECTED   Cryptosporidium NOT DETECTED NOT DETECTED  NOT DETECTED   Cyclospora cayetanensis NOT DETECTED NOT DETECTED  NOT DETECTED   Entamoeba histolytica NOT DETECTED NOT DETECTED  NOT DETECTED   Giardia lamblia NOT DETECTED NOT DETECTED  NOT DETECTED   Adenovirus F40/41 NOT DETECTED NOT DETECTED  NOT DETECTED   Astrovirus NOT DETECTED NOT DETECTED  NOT DETECTED   Norovirus GI/GII NOT DETECTED NOT DETECTED  NOT DETECTED   Rotavirus A NOT DETECTED NOT DETECTED  NOT DETECTED   Sapovirus (I, II, IV, and V) NOT DETECTED NOT DETECTED  NOT DETECTED CM   Comment: Performed at [facility], [address] [place], [place] [address]  Resulting Agency  [facility] CL[facility] LAB [facility]          Specimen Collected: 01/24/22 08:40 Last Resulted: 01/24/22 11:51

## 2022-01-24 NOTE — Telephone Encounter (Signed)
Discussed with Dr. Rogue Bussing and ID. Will hold on further abx for now as diarrhea seems to be improving with steroids given probably etiology of immunotherapy colitis.

## 2022-01-24 NOTE — Progress Notes (Signed)
Pt states the steroids are really helping the diarrhea. She is having difficulty sleeping. A refill on her Lorazepam was sent into [facility] pharmacy. Pt was instructed to hold steroids the morning of her PET scan. She is eating and drinking well. Patient declined fluids. Magnesium and potassium were normal. Appt for recheck on Friday.

## 2022-01-26 ENCOUNTER — Encounter
Admission: RE | Admit: 2022-01-26 | Discharge: 2022-01-26 | Disposition: A | Discharge status: Home / Self Care | Payer: Medicare PPO | Source: Ambulatory Visit | Attending: Internal Medicine | Admitting: Internal Medicine

## 2022-01-26 DIAGNOSIS — C159 Malignant neoplasm of esophagus, unspecified: Secondary | ICD-10-CM | POA: Diagnosis not present

## 2022-01-26 DIAGNOSIS — D490 Neoplasm of unspecified behavior of digestive system: Secondary | ICD-10-CM | POA: Diagnosis not present

## 2022-01-26 DIAGNOSIS — Z85118 Personal history of other malignant neoplasm of bronchus and lung: Secondary | ICD-10-CM | POA: Diagnosis not present

## 2022-01-26 DIAGNOSIS — C3411 Malignant neoplasm of upper lobe, right bronchus or lung: Secondary | ICD-10-CM | POA: Diagnosis not present

## 2022-01-26 DIAGNOSIS — J432 Centrilobular emphysema: Secondary | ICD-10-CM | POA: Diagnosis not present

## 2022-01-26 LAB — GLUCOSE, CAPILLARY: Glucose-Capillary: 115 mg/dL — ABNORMAL HIGH (ref 70–99)

## 2022-01-26 MED ORDER — FLUDEOXYGLUCOSE F - 18 (FDG) INJECTION
6.6000 | Freq: Once | INTRAVENOUS | Status: AC | PRN
  Administered 2022-01-26: 7.16 via INTRAVENOUS

## 2022-01-27 ENCOUNTER — Other Ambulatory Visit: Payer: Self-pay | Admitting: Internal Medicine

## 2022-01-27 DIAGNOSIS — D490 Neoplasm of unspecified behavior of digestive system: Secondary | ICD-10-CM

## 2022-01-27 NOTE — Assessment & Plan Note (Addendum)
#  Squamous cell carcinoma of the mid-esophagus-[facility];  s/p  concurrent chemoradiation  [finished Jan 25 th, 2023].  APRIL 16th, 2023- PET scan-  Resolution of metabolic activity in the mid esophagus; however persistent hypermetabolic activity within a subcarinal lymph node. MAY 18th, 2023- Status post endoscopic ultrasound/biopsy of the mediastinal/subcarinal lymph node- BIOPSY POSITIVE FOR RECURRENT SQUAMOUS CELL. [JUNE 9th, 2023]- OPDIVO q 2 W x4 cycles-January 26 2022-PET scan: Resolution of the previously described hypermetabolic subcarinal node. No evidence of hypermetabolic residual or recurrent disease.   #Continue to hold Opdivo at this time given patient's ongoing diarrhea-likely related to Opdivo/immunotherapy.  Consider restarting in about a month versus discontinuation.  Could consider based on further imaging.  # Diarrhea- G-2-?  Related to E. coli versus Opdivo.  S/p azithromycin 3 days. Improved/resolved- on prednisone 60 mg/day x1 week.  Take prednisone 2 pills for 1 week; 1 pill for 1 week; half a pill for 1 week and then stop.   # Hypomagnesemia- [jlu 2023]-due to magnesium 1.7; recommend Slow-Mag.   # Cough-? Sec to reflux; continue allergy medications/PPI; remote hx of smoking- contine Albuterol prn. On cough meds per Dr.Scott. STABLE   # Mild  Anemia- improved- Hb 10-11- sec to chemo-STABLE.   # PBF-BG- 168  overall stable.  Monitor for now [Gluerna protein shakes]-reminded of caution with steroids.  #Hx of goitre-/ TSH low-however normal free T3-T4- .currently on surveillance s/p evaluation with  Dr. Forde Dandy, [facility] endocrinology.  Will monitor.  # DISPOSITION:   # HOLD OPDIVO today  # follow up in 4 weeks; - MD; labs- cbc/cmp;thyroid profile ;OPDIVO; - Dr.B  # I reviewed the blood work- with the patient in detail; also reviewed the imaging independently [as summarized above]; and with the patient in detail.

## 2022-01-27 NOTE — Progress Notes (Signed)
ON PATHWAY REGIMEN - Gastroesophageal  No Change  Continue With Treatment as Ordered.  Original Decision Date/Time: 11/04/2021 14:54     A cycle is every 14 days:     Nivolumab   **Always confirm dose/schedule in your pharmacy ordering system**  Patient Characteristics: Distant Metastases (cM1/pM1) / Locally Recurrent Disease, Squamous Cell, Esophageal & [place], Second Line, MSS/pMMR or MSI Unknown, PD-L1 Expression CPS < 10/Negative/Unknown Therapeutic Status: Local Recurrence (No Additional Staging) Histology: Squamous Cell Disease Classification: Esophageal Line of Therapy: Second Line Microsatellite/Mismatch Repair Status: Unknown PD-L1 Expression Status: Awaiting Test Results Intent of Therapy: Non-Curative / Palliative Intent, Discussed with Patient

## 2022-01-27 NOTE — Progress Notes (Unsigned)
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  Starting radiation and chemotherapy from April 14, 2014 Patient was started on carboplatinum andTaxol Herve were developed an allergic reaction to Taxol so would be changed to Abraxane 3.patient has finished 6 cycles of weekly chemotherapy with carboplatinum  and radiation therapy(May 28, 2014) 4.started on  NIVOLULAMAB because of persistent disease July 02, 2014. 5.  NIVOLULAMAB was discontinued because of persistent diarrhea in July of 2016.  August of 2016 CT scan was stable so no further chemotherapy  # AUG 25th PET- STABLE RUL MASS [radiation fibrosis; <1cm ? 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   APRIL 16th, 2023- PET scan-  Resolution of metabolic activity in the mid esophagus;  Persistent hypermetabolic activity within a subcarinal lymph node. Differential includes residual carcinoma versus reactive adenopathy.  No evidence of distant metastatic disease.  # MAY 18th, 2023- s/p EUS  [Dr.Spaete]-endoscopic biopsy of the mediastinal lymph node. MAY 18th, 2023- Status post endoscopic ultrasound/biopsy of the mediastinal/subcarinal lymph node- BIOPSY POSITIVE FOR RECURRENT SQUAMOUS CELL.  # JUNE 9th, 2023- Opdivo every 2 weeks.   # AAA/ 3.3x 3.9 Stable [Dr.Schnier] -----------------------------------------------------       History of lung cancer  Malignant neoplasm of right upper lobe of lung ([facility])  08/07/2019 Initial Diagnosis   Malignant neoplasm of right upper lobe of lung ([facility])    INTERVAL HISTORY: Ambulating with a rolling walker.  Accompanied by her daughter Heather Cooper 88 y.o.  female pleasant patient  diagnosed squamous cell carcinoma of the midesophagus-with recurrent disease in the mediastinum currently on OPDIVO is here for a follow up/review results of the PET scan.  Patient's Opdivo has been interrupted because of ongoing diarrhea.  Patient is currently on prednisone.  Also treated for E. coli infection with azithromycin.  Patient noted to have resolution of her diarrhea. Feels good.    Review of Systems  Constitutional:  Positive for malaise/fatigue. Negative for chills, diaphoresis, fever and weight loss.  HENT:  Negative for nosebleeds and sore throat.   Eyes:  Negative for double vision.  Respiratory:  Negative for hemoptysis, sputum production, shortness of breath and wheezing.   Cardiovascular:  Negative for chest pain, palpitations, orthopnea and leg swelling.  Gastrointestinal:  Negative for abdominal pain, blood in stool, constipation, diarrhea, heartburn, melena, nausea and vomiting.  Genitourinary:  Negative for dysuria, frequency and urgency.  Musculoskeletal:  Negative for back pain and joint pain.  Skin: Negative.  Negative for itching and rash.  Neurological:  Negative for dizziness, tingling, focal weakness, weakness and headaches.  Endo/Heme/Allergies:  Does  not bruise/bleed easily.  Psychiatric/Behavioral:  Negative for  depression. The patient is not nervous/anxious and does not have insomnia.      PAST MEDICAL HISTORY :  Past Medical History:  Diagnosis Date   Chemotherapy induced nausea and vomiting    Chicken pox    Esophageal cancer (HCC)    Hypercalcemia    familial hypocalciuric hypercalcemia   Hypercholesterolemia    Hypertension    Lung cancer ([place])    Osteoporosis    Thyroid disease     PAST SURGICAL HISTORY :   Past Surgical History:  Procedure Laterality Date   ABDOMINAL HYSTERECTOMY     partial   CHOLECYSTECTOMY     COLONOSCOPY WITH PROPOFOL N/A 04/17/2017   Procedure: COLONOSCOPY WITH PROPOFOL;  Surgeon: Manya Silvas, MD;  Location: [facility] ENDOSCOPY;  Service: Endoscopy;  Laterality: N/A;   ESOPHAGOGASTRODUODENOSCOPY (EGD) WITH PROPOFOL N/A 04/13/2021   Procedure: ESOPHAGOGASTRODUODENOSCOPY (EGD) WITH PROPOFOL;  Surgeon: [name], MD;  Location: [facility] ENDOSCOPY;  Service: Endoscopy;  Laterality: N/A;   EUS N/A 04/22/2021   Procedure: FULL UPPER ENDOSCOPIC ULTRASOUND (EUS) RADIAL;  Surgeon: Jola Schmidt, MD;  Location: [facility] ENDOSCOPY;  Service: Endoscopy;  Laterality: N/A;  [facility] needed   EUS N/A 10/28/2021   Procedure: UPPER ENDOSCOPIC ULTRASOUND (EUS) LINEAR;  Surgeon: Reita Cliche, MD;  Location: [facility] ENDOSCOPY;  Service: Gastroenterology;  Laterality: N/A;  [facility]   transvaginal hysterectomy  04/18/05   with anterior colporrhaphy    FAMILY HISTORY :   Family History  Problem Relation Age of Onset   Stroke Mother    Hypertension Mother    Prostate cancer Father    Cancer Father        prostate   Heart disease Brother        s/p CABG   Colon cancer Neg Hx     SOCIAL HISTORY:   Social History   Tobacco Use   Smoking status: Former    Types: Cigarettes    Quit date: 06/13/1997    Years since quitting: 24.6   Smokeless tobacco: Never  Vaping Use   Vaping Use: Never used  Substance Use Topics   Alcohol use: No    Alcohol/week: 0.0 standard drinks of  alcohol   Drug use: No    ALLERGIES:  is allergic to paclitaxel.  MEDICATIONS:  Current Outpatient Medications  Medication Sig Dispense Refill   albuterol (VENTOLIN HFA) 108 (90 Base) MCG/ACT inhaler Inhale 2 puffs into the lungs every 6 (six) hours as needed for wheezing or shortness of breath. 8 g 2   cholecalciferol (VITAMIN D3) 25 MCG (1000 UNIT) tablet Take 1,000 Units by mouth daily.     diphenoxylate-atropine (LOMOTIL) 2.5-0.025 MG tablet Take 1 tablet by mouth 4 (four) times daily as needed for diarrhea or loose stools. Take it along with immodium 60 tablet 0   fluticasone (FLONASE) 50 MCG/ACT nasal spray Place 2 sprays into both nostrils daily. 16 g 3   levocetirizine (XYZAL) 5 MG tablet Take 1 tablet (5 mg total) by mouth every evening. 90 tablet 2   loperamide (IMODIUM) 2 MG capsule Take 2 mg by mouth as needed for diarrhea or loose stools.     LORazepam (ATIVAN) 0.5 MG tablet Take 1 tablet (0.5 mg total) by mouth every 6 (six) hours as needed for anxiety. 30 tablet 0   Magnesium Cl-Calcium Carbonate (SLOW MAGNESIUM/CALCIUM) 70-117 MG TBEC Take 1 tablet by mouth 2 (two) times daily. 60 tablet 6  olmesartan-hydrochlorothiazide (BENICAR HCT) 20-12.5 MG tablet TAKE 1 TABLET BY MOUTH ONCE DAILY 90 tablet 1   pantoprazole (PROTONIX) 40 MG tablet TAKE 1 TABLET BY MOUTH ONCE DAILY 90 tablet 3   predniSONE (DELTASONE) 20 MG tablet 3 tablets daily x 1 week, then 2 tablets daily x 1 week, then 1 tablet daily x 1 week, then half tab daily x 1 week, then stop 60 tablet 0   No current facility-administered medications for this visit.   Facility-Administered Medications Ordered in Other Visits  Medication Dose Route Frequency Provider Last Rate Last Admin   0.9 %  sodium chloride infusion   Intravenous Continuous Borders, Kirt Boys, NP   Stopped at 01/21/22 1247   sodium chloride 0.9 % 1,000 mL with potassium chloride 20 mEq, magnesium sulfate 2 g infusion   Intravenous Continuous Forest Gleason, MD   Stopped at 12/04/14 1300    PHYSICAL EXAMINATION: ECOG PERFORMANCE STATUS: 0 - Asymptomatic  BP (!) 157/62 (BP Location: Left Arm, Patient Position: Sitting, Cuff Size: Normal)   Pulse 73   Temp 98.5 F (36.9 C) (Tympanic)   Ht 5' 3"  (1.6 m)   Wt 125 lb 3.2 oz (56.8 kg)   SpO2 100%   BMI 22.18 kg/m   Filed Weights   01/28/22 0831  Weight: 125 lb 3.2 oz (56.8 kg)       Physical Exam HENT:     Head: Normocephalic and atraumatic.     Mouth/Throat:     Pharynx: No oropharyngeal exudate.  Eyes:     Pupils: Pupils are equal, round, and reactive to light.  Cardiovascular:     Rate and Rhythm: Normal rate and regular rhythm.  Pulmonary:     Effort: Pulmonary effort is normal. No respiratory distress.     Breath sounds: Normal breath sounds. No wheezing.  Abdominal:     General: Bowel sounds are normal. There is no distension.     Palpations: Abdomen is soft. There is no mass.     Tenderness: There is no abdominal tenderness. There is no guarding or rebound.  Musculoskeletal:        General: No tenderness. Normal range of motion.     Cervical back: Normal range of motion and neck supple.  Skin:    General: Skin is warm.  Neurological:     Mental Status: She is alert and oriented to person, place, and time.  Psychiatric:        Mood and Affect: Affect normal.      LABORATORY DATA:  I have reviewed the data as listed    Component Value Date/Time   NA 132 (L) 01/28/2022 0815   NA 130 (L) 10/09/2014 0846   K 3.8 01/28/2022 0815   K 4.1 10/09/2014 0846   CL 97 (L) 01/28/2022 0815   CL 98 (L) 10/09/2014 0846   CO2 29 01/28/2022 0815   CO2 25 10/09/2014 0846   GLUCOSE 168 (H) 01/28/2022 0815   GLUCOSE 161 (H) 10/09/2014 0846   BUN 26 (H) 01/28/2022 0815   BUN 14 10/09/2014 0846   CREATININE 0.87 01/28/2022 0815   CREATININE 0.70 10/09/2014 0846   CALCIUM 9.8 01/28/2022 0815   CALCIUM 9.1 10/09/2014 0846   PROT 7.0 01/28/2022 0815   PROT 7.3  10/09/2014 0846   ALBUMIN 3.7 01/28/2022 0815   ALBUMIN 3.6 10/09/2014 0846   AST 14 (L) 01/28/2022 0815   AST 16 10/09/2014 0846   ALT 13 01/28/2022 0815   ALT 13 (  L) 10/09/2014 0846   ALKPHOS 56 01/28/2022 0815   ALKPHOS 70 10/09/2014 0846   BILITOT 0.9 01/28/2022 0815   BILITOT 1.0 10/09/2014 0846   GFRNONAA >60 01/28/2022 0815   GFRNONAA >60 10/09/2014 0846   GFRAA >60 02/05/2020 1004   GFRAA >60 10/09/2014 0846    No results found for: "SPEP", "UPEP"  Lab Results  Component Value Date   WBC 12.6 (H) 01/28/2022   NEUTROABS 9.3 (H) 01/28/2022   HGB 12.2 01/28/2022   HCT 36.6 01/28/2022   MCV 88.0 01/28/2022   PLT 283 01/28/2022      Chemistry      Component Value Date/Time   NA 132 (L) 01/28/2022 0815   NA 130 (L) 10/09/2014 0846   K 3.8 01/28/2022 0815   K 4.1 10/09/2014 0846   CL 97 (L) 01/28/2022 0815   CL 98 (L) 10/09/2014 0846   CO2 29 01/28/2022 0815   CO2 25 10/09/2014 0846   BUN 26 (H) 01/28/2022 0815   BUN 14 10/09/2014 0846   CREATININE 0.87 01/28/2022 0815   CREATININE 0.70 10/09/2014 0846   GLU 111 03/18/2014 1048      Component Value Date/Time   CALCIUM 9.8 01/28/2022 0815   CALCIUM 9.1 10/09/2014 0846   ALKPHOS 56 01/28/2022 0815   ALKPHOS 70 10/09/2014 0846   AST 14 (L) 01/28/2022 0815   AST 16 10/09/2014 0846   ALT 13 01/28/2022 0815   ALT 13 (L) 10/09/2014 0846   BILITOT 0.9 01/28/2022 0815   BILITOT 1.0 10/09/2014 0846       RADIOGRAPHIC STUDIES: I have personally reviewed the radiological images as listed and agreed with the findings in the report. No results found.    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  APRIL 16th, 2023- PET scan-  Resolution of metabolic activity in the mid esophagus; however persistent hypermetabolic activity within a subcarinal lymph node. MAY 18th,  2023- Status post endoscopic ultrasound/biopsy of the mediastinal/subcarinal lymph node- BIOPSY POSITIVE FOR RECURRENT SQUAMOUS CELL. [JUNE 9th, 2023]- OPDIVO q 2 W x4 cycles-January 26 2022-PET scan: Resolution of the previously described hypermetabolic subcarinal node. No evidence of hypermetabolic residual or recurrent disease.   #Continue to hold Opdivo at this time given patient's ongoing diarrhea-likely related to Opdivo/immunotherapy.  Consider restarting in about a month versus discontinuation.  Could consider based on further imaging.  # Diarrhea- G-2-?  Related to E. coli versus Opdivo.  S/p azithromycin 3 days. Improved/resolved- on prednisone 60 mg/day x1 week.  Take prednisone 2 pills for 1 week; 1 pill for 1 week; half a pill for 1 week and then stop.   # Hypomagnesemia- [jlu 2023]-due to magnesium 1.7; recommend Slow-Mag.   # Cough-? Sec to reflux; continue allergy medications/PPI; remote hx of smoking- contine Albuterol prn. On cough meds per Dr.Scott. STABLE   # Mild  Anemia- improved- Hb 10-11- sec to chemo-STABLE.   # PBF-BG- 168  overall stable.  Monitor for now [Gluerna protein shakes]-reminded of caution with steroids.  #Hx of goitre-/ TSH low-however normal free T3-T4- .currently on surveillance s/p evaluation with  Dr. Forde Dandy, [facility] endocrinology.  Will monitor.  # DISPOSITION:   # HOLD OPDIVO today  # follow up in 4 weeks; - MD; labs- cbc/cmp;thyroid profile ;OPDIVO; - Dr.B  # I reviewed the blood work- with the patient in detail;  also reviewed the imaging independently [as summarized above]; and with the patient in detail.         Orders Placed This Encounter  Procedures   Thyroid Panel With TSH    Standing Status:   Future    Standing Expiration Date:   01/29/2023    All questions were answered. The patient knows to call the clinic with any problems, questions or concerns.      Cammie Sickle, MD 01/30/2022 3:03 PM

## 2022-01-28 ENCOUNTER — Inpatient Hospital Stay: Payer: Medicare PPO

## 2022-01-28 ENCOUNTER — Ambulatory Visit: Payer: Medicare PPO

## 2022-01-28 ENCOUNTER — Ambulatory Visit: Payer: Medicare PPO | Admitting: Internal Medicine

## 2022-01-28 ENCOUNTER — Other Ambulatory Visit: Payer: Medicare PPO

## 2022-01-28 ENCOUNTER — Inpatient Hospital Stay (HOSPITAL_BASED_OUTPATIENT_CLINIC_OR_DEPARTMENT_OTHER): Payer: Medicare PPO | Admitting: Internal Medicine

## 2022-01-28 ENCOUNTER — Encounter: Payer: Self-pay | Admitting: Internal Medicine

## 2022-01-28 DIAGNOSIS — C154 Malignant neoplasm of middle third of esophagus: Secondary | ICD-10-CM | POA: Diagnosis not present

## 2022-01-28 DIAGNOSIS — D649 Anemia, unspecified: Secondary | ICD-10-CM | POA: Diagnosis not present

## 2022-01-28 DIAGNOSIS — D490 Neoplasm of unspecified behavior of digestive system: Secondary | ICD-10-CM

## 2022-01-28 DIAGNOSIS — C3411 Malignant neoplasm of upper lobe, right bronchus or lung: Secondary | ICD-10-CM | POA: Diagnosis not present

## 2022-01-28 DIAGNOSIS — Z87891 Personal history of nicotine dependence: Secondary | ICD-10-CM | POA: Diagnosis not present

## 2022-01-28 DIAGNOSIS — R197 Diarrhea, unspecified: Secondary | ICD-10-CM | POA: Diagnosis not present

## 2022-01-28 LAB — CBC WITH DIFFERENTIAL/PLATELET
Abs Immature Granulocytes: 0.1 10*3/uL — ABNORMAL HIGH (ref 0.00–0.07)
Basophils Absolute: 0 10*3/uL (ref 0.0–0.1)
Basophils Relative: 0 %
Eosinophils Absolute: 0 10*3/uL (ref 0.0–0.5)
Eosinophils Relative: 0 %
HCT: 36.6 % (ref 36.0–46.0)
Hemoglobin: 12.2 g/dL (ref 12.0–15.0)
Immature Granulocytes: 1 %
Lymphocytes Relative: 18 %
Lymphs Abs: 2.3 10*3/uL (ref 0.7–4.0)
MCH: 29.3 pg (ref 26.0–34.0)
MCHC: 33.3 g/dL (ref 30.0–36.0)
MCV: 88 fL (ref 80.0–100.0)
Monocytes Absolute: 1 10*3/uL (ref 0.1–1.0)
Monocytes Relative: 8 %
Neutro Abs: 9.3 10*3/uL — ABNORMAL HIGH (ref 1.7–7.7)
Neutrophils Relative %: 73 %
Platelets: 283 10*3/uL (ref 150–400)
RBC: 4.16 MIL/uL (ref 3.87–5.11)
RDW: 14.4 % (ref 11.5–15.5)
WBC: 12.6 10*3/uL — ABNORMAL HIGH (ref 4.0–10.5)
nRBC: 0 % (ref 0.0–0.2)

## 2022-01-28 LAB — COMPREHENSIVE METABOLIC PANEL
ALT: 13 U/L (ref 0–44)
AST: 14 U/L — ABNORMAL LOW (ref 15–41)
Albumin: 3.7 g/dL (ref 3.5–5.0)
Alkaline Phosphatase: 56 U/L (ref 38–126)
Anion gap: 6 (ref 5–15)
BUN: 26 mg/dL — ABNORMAL HIGH (ref 8–23)
CO2: 29 mmol/L (ref 22–32)
Calcium: 9.8 mg/dL (ref 8.9–10.3)
Chloride: 97 mmol/L — ABNORMAL LOW (ref 98–111)
Creatinine, Ser: 0.87 mg/dL (ref 0.44–1.00)
GFR, Estimated: >60 mL/min (ref >60)
Glucose, Bld: 168 mg/dL — ABNORMAL HIGH (ref 70–99)
Potassium: 3.8 mmol/L (ref 3.5–5.1)
Sodium: 132 mmol/L — ABNORMAL LOW (ref 135–145)
Total Bilirubin: 0.9 mg/dL (ref 0.3–1.2)
Total Protein: 7 g/dL (ref 6.5–8.1)

## 2022-01-28 LAB — MAGNESIUM: Magnesium: 1.8 mg/dL (ref 1.7–2.4)

## 2022-01-28 NOTE — Patient Instructions (Signed)
#  Take prednisone 2 pills for 1 week; 1 pill for 1 week; half a pill for 1 week and then stop.

## 2022-01-28 NOTE — Progress Notes (Signed)
RN notified hold OPDIVO today

## 2022-01-28 NOTE — Progress Notes (Signed)
Nutrition  RD planning to see patient in infusion but held today.    Chart reviewed.  Called patient for nutrition follow-up by phone.  No answer.  Left message with call back number.  Serena Petterson B. Zenia Resides, [place], [place] Registered Dietitian [phone]

## 2022-01-30 ENCOUNTER — Encounter: Payer: Self-pay | Admitting: Internal Medicine

## 2022-02-02 ENCOUNTER — Telehealth: Payer: Self-pay | Admitting: Internal Medicine

## 2022-02-02 NOTE — Telephone Encounter (Signed)
this pt just called and stated that she comes every 4 weeks, so I looked at the last check out note and scheduled from that. is that ok? or did she not need another appt?

## 2022-02-02 NOTE — Telephone Encounter (Signed)
According to Dr. Cain Sieve last note: # follow up in 4 weeks; - MD; labs- cbc/cmp;thyroid profile ;OPDIVO; - Dr.B, looks right to me.

## 2022-02-04 ENCOUNTER — Other Ambulatory Visit: Payer: Self-pay | Admitting: Internal Medicine

## 2022-02-25 ENCOUNTER — Encounter: Payer: Self-pay | Admitting: Internal Medicine

## 2022-02-25 ENCOUNTER — Inpatient Hospital Stay: Payer: Medicare PPO

## 2022-02-25 ENCOUNTER — Inpatient Hospital Stay: Payer: Medicare PPO | Attending: Internal Medicine | Admitting: Internal Medicine

## 2022-02-25 DIAGNOSIS — Z79899 Other long term (current) drug therapy: Secondary | ICD-10-CM | POA: Insufficient documentation

## 2022-02-25 DIAGNOSIS — D649 Anemia, unspecified: Secondary | ICD-10-CM | POA: Insufficient documentation

## 2022-02-25 DIAGNOSIS — C154 Malignant neoplasm of middle third of esophagus: Secondary | ICD-10-CM | POA: Diagnosis not present

## 2022-02-25 DIAGNOSIS — D490 Neoplasm of unspecified behavior of digestive system: Secondary | ICD-10-CM

## 2022-02-25 DIAGNOSIS — C781 Secondary malignant neoplasm of mediastinum: Secondary | ICD-10-CM | POA: Insufficient documentation

## 2022-02-25 DIAGNOSIS — C3411 Malignant neoplasm of upper lobe, right bronchus or lung: Secondary | ICD-10-CM | POA: Diagnosis not present

## 2022-02-25 LAB — COMPREHENSIVE METABOLIC PANEL
ALT: 21 U/L (ref 0–44)
AST: 22 U/L (ref 15–41)
Albumin: 3.6 g/dL (ref 3.5–5.0)
Alkaline Phosphatase: 54 U/L (ref 38–126)
Anion gap: 8 (ref 5–15)
BUN: 11 mg/dL (ref 8–23)
CO2: 28 mmol/L (ref 22–32)
Calcium: 9.3 mg/dL (ref 8.9–10.3)
Chloride: 100 mmol/L (ref 98–111)
Creatinine, Ser: 0.78 mg/dL (ref 0.44–1.00)
GFR, Estimated: >60 mL/min (ref >60)
Glucose, Bld: 161 mg/dL — ABNORMAL HIGH (ref 70–99)
Potassium: 3.7 mmol/L (ref 3.5–5.1)
Sodium: 136 mmol/L (ref 135–145)
Total Bilirubin: 0.7 mg/dL (ref 0.3–1.2)
Total Protein: 6.8 g/dL (ref 6.5–8.1)

## 2022-02-25 LAB — CBC WITH DIFFERENTIAL/PLATELET
Abs Immature Granulocytes: 0.03 10*3/uL (ref 0.00–0.07)
Basophils Absolute: 0 10*3/uL (ref 0.0–0.1)
Basophils Relative: 0 %
Eosinophils Absolute: 0 10*3/uL (ref 0.0–0.5)
Eosinophils Relative: 1 %
HCT: 33.9 % — ABNORMAL LOW (ref 36.0–46.0)
Hemoglobin: 11.4 g/dL — ABNORMAL LOW (ref 12.0–15.0)
Immature Granulocytes: 1 %
Lymphocytes Relative: 29 %
Lymphs Abs: 1.5 10*3/uL (ref 0.7–4.0)
MCH: 29.8 pg (ref 26.0–34.0)
MCHC: 33.6 g/dL (ref 30.0–36.0)
MCV: 88.7 fL (ref 80.0–100.0)
Monocytes Absolute: 0.5 10*3/uL (ref 0.1–1.0)
Monocytes Relative: 11 %
Neutro Abs: 3 10*3/uL (ref 1.7–7.7)
Neutrophils Relative %: 58 %
Platelets: 172 10*3/uL (ref 150–400)
RBC: 3.82 MIL/uL — ABNORMAL LOW (ref 3.87–5.11)
RDW: 14.3 % (ref 11.5–15.5)
WBC: 5.1 10*3/uL (ref 4.0–10.5)
nRBC: 0 % (ref 0.0–0.2)

## 2022-02-25 MED ORDER — LORAZEPAM 0.5 MG PO TABS: 0.5000 mg | ORAL_TABLET | Freq: Four times a day (QID) | ORAL | 1 refills | Status: DC | PRN

## 2022-02-25 NOTE — Progress Notes (Signed)
No concerns. 

## 2022-02-25 NOTE — Assessment & Plan Note (Addendum)
#  Squamous cell carcinoma of the mid-esophagus-NOV 2022[facility] advanced;  s/p  concurrent chemoradiation  [finished Jan 25 th, 2023].  APRIL 16th, 2023- PET scan-  Resolution of metabolic activity in the mid esophagus; however persistent hypermetabolic activity within a subcarinal lymph node. MAY 18th, 2023- Status post endoscopic ultrasound/biopsy of the mediastinal/subcarinal lymph node- BIOPSY POSITIVE FOR RECURRENT SQUAMOUS CELL. [JUNE 9th, 2023]- OPDIVO q 2 W x4 cycles-January 26 2022-PET scan: Resolution of the previously described hypermetabolic subcarinal node. No evidence of hypermetabolic residual or recurrent disease.   #Continue to hold Opdivo at this time given patient's recent episode of severe diarrhea-likely related to Opdivo/immunotherapy.  Continue to hold Opdivo at this time.  Could consider restarting based on further imaging.  Plan order imaging at next visit.  # Diarrhea- G-2-?  Related to E. coli versus Opdivo.  S/p azithromycin 3 days. Improved/resolved- on prednisone 60 mg/day - tapered x 4-6 weeks. Monitor for now.   # Hypomagnesemia- [jlu 2023]-due to magnesium 1.7; recommend Slow-Mag.  We will recheck mag at next visit.  # Mild  Anemia- improved- Hb 11- sec to chemo-STABLE.   # PBF-BG- 161  overall stable.  Monitor for now [Gluerna protein shakes]-hopefully improve as patient is currently off steroids.  Monitor for now  #Hx of goitre-/ TSH low-however normal free T3-T4- .currently on surveillance s/p evaluation with  Dr. Forde Dandy, [facility] endocrinology.  Awaiting labs from today.   # Vaccinations: ok with Flu shot; and Covid-19.   # IV access: PIV.   # DISPOSITION:   # HOLD OPDIVO today  # follow up in 4 weeks; - MD; labs- cbc/cmp;thyroid profile; magnesium;  NO treatment- - Dr.B

## 2022-02-25 NOTE — Progress Notes (Signed)
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  Starting radiation and chemotherapy from April 14, 2014 Patient was started on carboplatinum andTaxol Herve were developed an allergic reaction to Taxol so would be changed to Abraxane 3.patient has finished 6 cycles of weekly chemotherapy with carboplatinum  and radiation therapy(May 28, 2014) 4.started on  NIVOLULAMAB because of persistent disease July 02, 2014. 5.  NIVOLULAMAB was discontinued because of persistent diarrhea in July of 2016.  August of 2016 CT scan was stable so no further chemotherapy  # AUG 25th PET- STABLE RUL MASS [radiation fibrosis; <1cm ? 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   APRIL 16th, 2023- PET scan-  Resolution of metabolic activity in the mid esophagus;  Persistent hypermetabolic activity within a subcarinal lymph node. Differential includes residual carcinoma versus reactive adenopathy.  No evidence of distant metastatic disease.  # MAY 18th, 2023- s/p EUS  [Dr.Spaete]-endoscopic biopsy of the mediastinal lymph node. MAY 18th, 2023- Status post endoscopic ultrasound/biopsy of the mediastinal/subcarinal lymph node- BIOPSY POSITIVE FOR RECURRENT SQUAMOUS CELL.  # JUNE 9th, 2023- Opdivo every 2 weeks.   # AAA/ 3.3x 3.9 Stable [Dr.Schnier] -----------------------------------------------------       History of lung cancer  Malignant neoplasm of right upper lobe of lung ([place])  08/07/2019 Initial Diagnosis   Malignant neoplasm of right upper lobe of lung ([place])    INTERVAL HISTORY: Ambulating with a rolling walker.  Accompanied by her daughter Heather Cooper 88 y.o.  female pleasant patient  diagnosed squamous cell carcinoma of the midesophagus-with recurrent disease in the mediastinum most recently on OPDIVO is here for a follow up.  Patient's Opdivo is currently on HOLD because of ongoing diarrhea.  Patient also most recently on prednisone.  Also treated for E. coli infection with azithromycin.  Patient noted to have resolution of her diarrhea. Feels good.  Appetite is improved.  Weight stable.  She is more  up and about.   Review of Systems  Constitutional:  Positive for malaise/fatigue. Negative for chills, diaphoresis, fever and weight loss.  HENT:  Negative for nosebleeds and sore throat.   Eyes:  Negative for double vision.  Respiratory:  Negative for hemoptysis, sputum production, shortness of breath and wheezing.   Cardiovascular:  Negative for chest pain, palpitations, orthopnea and leg swelling.  Gastrointestinal:  Negative for abdominal pain, blood in stool, constipation, diarrhea, heartburn, melena, nausea and vomiting.  Genitourinary:  Negative for dysuria, frequency and urgency.  Musculoskeletal:  Negative for back pain and joint pain.  Skin: Negative.  Negative for itching and rash.  Neurological:  Negative  for dizziness, tingling, focal weakness, weakness and headaches.  Endo/Heme/Allergies:  Does not bruise/bleed  easily.  Psychiatric/Behavioral:  Negative for depression. The patient is not nervous/anxious and does not have insomnia.      PAST MEDICAL HISTORY :  Past Medical History:  Diagnosis Date   Chemotherapy induced nausea and vomiting    Chicken pox    Esophageal cancer (HCC)    Hypercalcemia    familial hypocalciuric hypercalcemia   Hypercholesterolemia    Hypertension    Lung cancer ([place])    Osteoporosis    Thyroid disease     PAST SURGICAL HISTORY :   Past Surgical History:  Procedure Laterality Date   ABDOMINAL HYSTERECTOMY     partial   CHOLECYSTECTOMY     COLONOSCOPY WITH PROPOFOL N/A 04/17/2017   Procedure: COLONOSCOPY WITH PROPOFOL;  Surgeon: Manya Silvas, MD;  Location: [facility] ENDOSCOPY;  Service: Endoscopy;  Laterality: N/A;   ESOPHAGOGASTRODUODENOSCOPY (EGD) WITH PROPOFOL N/A 04/13/2021   Procedure: ESOPHAGOGASTRODUODENOSCOPY (EGD) WITH PROPOFOL;  Surgeon: [name], MD;  Location: [facility] ENDOSCOPY;  Service: Endoscopy;  Laterality: N/A;   EUS N/A 04/22/2021   Procedure: FULL UPPER ENDOSCOPIC ULTRASOUND (EUS) RADIAL;  Surgeon: Jola Schmidt, MD;  Location: [facility] ENDOSCOPY;  Service: Endoscopy;  Laterality: N/A;  [facility] needed   EUS N/A 10/28/2021   Procedure: UPPER ENDOSCOPIC ULTRASOUND (EUS) LINEAR;  Surgeon: Reita Cliche, MD;  Location: [facility] ENDOSCOPY;  Service: Gastroenterology;  Laterality: N/A;  [facility]   transvaginal hysterectomy  04/18/05   with anterior colporrhaphy    FAMILY HISTORY :   Family History  Problem Relation Age of Onset   Stroke Mother    Hypertension Mother    Prostate cancer Father    Cancer Father        prostate   Heart disease Brother        s/p CABG   Colon cancer Neg Hx     SOCIAL HISTORY:   Social History   Tobacco Use   Smoking status: Former    Types: Cigarettes    Quit date: 06/13/1997    Years since quitting: 24.7   Smokeless tobacco: Never  Vaping Use   Vaping Use: Never used  Substance Use Topics   Alcohol  use: No    Alcohol/week: 0.0 standard drinks of alcohol   Drug use: No    ALLERGIES:  is allergic to paclitaxel.  MEDICATIONS:  Current Outpatient Medications  Medication Sig Dispense Refill   albuterol (VENTOLIN HFA) 108 (90 Base) MCG/ACT inhaler Inhale 2 puffs into the lungs every 6 (six) hours as needed for wheezing or shortness of breath. 8 g 2   cholecalciferol (VITAMIN D3) 25 MCG (1000 UNIT) tablet Take 1,000 Units by mouth daily.     diphenoxylate-atropine (LOMOTIL) 2.5-0.025 MG tablet Take 1 tablet by mouth 4 (four) times daily as needed for diarrhea or loose stools. Take it along with immodium 60 tablet 0   fluticasone (FLONASE) 50 MCG/ACT nasal spray Place 2 sprays into both nostrils daily. 16 g 3   levocetirizine (XYZAL) 5 MG tablet Take 1 tablet (5 mg total) by mouth every evening. 90 tablet 2   loperamide (IMODIUM) 2 MG capsule Take 2 mg by mouth as needed for diarrhea or loose stools.     Magnesium Cl-Calcium Carbonate (SLOW MAGNESIUM/CALCIUM) 70-117 MG TBEC Take 1 tablet by mouth 2 (two) times daily. 60 tablet 6   olmesartan-hydrochlorothiazide (BENICAR HCT) 20-12.5 MG tablet TAKE 1 TABLET BY MOUTH ONCE DAILY 90  tablet 1   pantoprazole (PROTONIX) 40 MG tablet TAKE 1 TABLET BY MOUTH ONCE DAILY 90 tablet 3   [START ON 03/25/2022] LORazepam (ATIVAN) 0.5 MG tablet Take 1 tablet (0.5 mg total) by mouth every 6 (six) hours as needed for anxiety. 30 tablet 1   No current facility-administered medications for this visit.   Facility-Administered Medications Ordered in Other Visits  Medication Dose Route Frequency Provider Last Rate Last Admin   0.9 %  sodium chloride infusion   Intravenous Continuous Borders, Kirt Boys, NP   Stopped at 01/21/22 1247   sodium chloride 0.9 % 1,000 mL with potassium chloride 20 mEq, magnesium sulfate 2 g infusion   Intravenous Continuous Forest Gleason, MD   Stopped at 12/04/14 1300    PHYSICAL EXAMINATION: ECOG PERFORMANCE STATUS: 0 -  Asymptomatic  BP 131/66 (BP Location: Left Arm, Patient Position: Sitting, Cuff Size: Normal)   Pulse (!) 102   Temp (!) 97.2 F (36.2 C) (Tympanic)   Ht $R'5\' 3"'iG$  (1.6 m)   Wt 124 lb 12.8 oz (56.6 kg)   SpO2 100%   BMI 22.11 kg/m   Filed Weights   02/25/22 0842  Weight: 124 lb 12.8 oz (56.6 kg)       Physical Exam HENT:     Head: Normocephalic and atraumatic.     Mouth/Throat:     Pharynx: No oropharyngeal exudate.  Eyes:     Pupils: Pupils are equal, round, and reactive to light.  Cardiovascular:     Rate and Rhythm: Normal rate and regular rhythm.  Pulmonary:     Effort: Pulmonary effort is normal. No respiratory distress.     Breath sounds: Normal breath sounds. No wheezing.  Abdominal:     General: Bowel sounds are normal. There is no distension.     Palpations: Abdomen is soft. There is no mass.     Tenderness: There is no abdominal tenderness. There is no guarding or rebound.  Musculoskeletal:        General: No tenderness. Normal range of motion.     Cervical back: Normal range of motion and neck supple.  Skin:    General: Skin is warm.  Neurological:     Mental Status: She is alert and oriented to person, place, and time.  Psychiatric:        Mood and Affect: Affect normal.      LABORATORY DATA:  I have reviewed the data as listed    Component Value Date/Time   NA 136 02/25/2022 0845   NA 130 (L) 10/09/2014 0846   K 3.7 02/25/2022 0845   K 4.1 10/09/2014 0846   CL 100 02/25/2022 0845   CL 98 (L) 10/09/2014 0846   CO2 28 02/25/2022 0845   CO2 25 10/09/2014 0846   GLUCOSE 161 (H) 02/25/2022 0845   GLUCOSE 161 (H) 10/09/2014 0846   BUN 11 02/25/2022 0845   BUN 14 10/09/2014 0846   CREATININE 0.78 02/25/2022 0845   CREATININE 0.70 10/09/2014 0846   CALCIUM 9.3 02/25/2022 0845   CALCIUM 9.1 10/09/2014 0846   PROT 6.8 02/25/2022 0845   PROT 7.3 10/09/2014 0846   ALBUMIN 3.6 02/25/2022 0845   ALBUMIN 3.6 10/09/2014 0846   AST 22 02/25/2022 0845    AST 16 10/09/2014 0846   ALT 21 02/25/2022 0845   ALT 13 (L) 10/09/2014 0846   ALKPHOS 54 02/25/2022 0845   ALKPHOS 70 10/09/2014 0846   BILITOT 0.7 02/25/2022 0845   BILITOT 1.0 10/09/2014 0846  GFRNONAA >60 02/25/2022 0845   GFRNONAA >60 10/09/2014 0846   GFRAA >60 02/05/2020 1004   GFRAA >60 10/09/2014 0846    No results found for: "SPEP", "UPEP"  Lab Results  Component Value Date   WBC 5.1 02/25/2022   NEUTROABS 3.0 02/25/2022   HGB 11.4 (L) 02/25/2022   HCT 33.9 (L) 02/25/2022   MCV 88.7 02/25/2022   PLT 172 02/25/2022      Chemistry      Component Value Date/Time   NA 136 02/25/2022 0845   NA 130 (L) 10/09/2014 0846   K 3.7 02/25/2022 0845   K 4.1 10/09/2014 0846   CL 100 02/25/2022 0845   CL 98 (L) 10/09/2014 0846   CO2 28 02/25/2022 0845   CO2 25 10/09/2014 0846   BUN 11 02/25/2022 0845   BUN 14 10/09/2014 0846   CREATININE 0.78 02/25/2022 0845   CREATININE 0.70 10/09/2014 0846   GLU 111 03/18/2014 1048      Component Value Date/Time   CALCIUM 9.3 02/25/2022 0845   CALCIUM 9.1 10/09/2014 0846   ALKPHOS 54 02/25/2022 0845   ALKPHOS 70 10/09/2014 0846   AST 22 02/25/2022 0845   AST 16 10/09/2014 0846   ALT 21 02/25/2022 0845   ALT 13 (L) 10/09/2014 0846   BILITOT 0.7 02/25/2022 0845   BILITOT 1.0 10/09/2014 0846       RADIOGRAPHIC STUDIES: I have personally reviewed the radiological images as listed and agreed with the findings in the report. No results found.    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  APRIL 16th, 2023- PET scan-  Resolution of metabolic activity in the mid esophagus; however persistent hypermetabolic activity within a subcarinal lymph node. MAY 18th, 2023- Status post endoscopic ultrasound/biopsy of the mediastinal/subcarinal lymph node- BIOPSY POSITIVE FOR RECURRENT  SQUAMOUS CELL. [JUNE 9th, 2023]- OPDIVO q 2 W x4 cycles-January 26 2022-PET scan: Resolution of the previously described hypermetabolic subcarinal node. No evidence of hypermetabolic residual or recurrent disease.   #Continue to hold Opdivo at this time given patient's recent episode of severe diarrhea-likely related to Opdivo/immunotherapy.  Continue to hold Opdivo at this time.  Could consider restarting based on further imaging.  Plan order imaging at next visit.  # Diarrhea- G-2-?  Related to E. coli versus Opdivo.  S/p azithromycin 3 days. Improved/resolved- on prednisone 60 mg/day - tapered x 4-6 weeks. Monitor for now.   # Hypomagnesemia- [jlu 2023]-due to magnesium 1.7; recommend Slow-Mag.  We will recheck mag at next visit.  # Mild  Anemia- improved- Hb 11- sec to chemo-STABLE.   # PBF-BG- 161  overall stable.  Monitor for now [Gluerna protein shakes]-hopefully improve as patient is currently off steroids.  Monitor for now  #Hx of goitre-/ TSH low-however normal free T3-T4- .currently on surveillance s/p evaluation with  Dr. Forde Dandy, [facility] endocrinology.  Awaiting labs from today.   # Vaccinations: ok with Flu shot; and Covid-19.   # IV access: PIV.   # DISPOSITION:   # HOLD OPDIVO today  # follow up in 4 weeks; - MD; labs- cbc/cmp;thyroid profile; magnesium;  NO treatment- - Dr.B         Orders Placed This Encounter  Procedures   Thyroid Panel With TSH    Standing Status:   Future    Standing Expiration Date:   02/26/2023  Magnesium    Standing Status:   Future    Standing Expiration Date:   02/26/2023    All questions were answered. The patient knows to call the clinic with any problems, questions or concerns.      Cammie Sickle, MD 02/25/2022 9:24 AM

## 2022-02-26 LAB — THYROID PANEL WITH TSH
Free Thyroxine Index: 2.5 (ref 1.2–4.9)
T3 Uptake Ratio: 33 % (ref 24–39)
T4, Total: 7.7 ug/dL (ref 4.5–12.0)
TSH: 0.603 u[IU]/mL (ref 0.450–4.500)

## 2022-03-03 ENCOUNTER — Encounter: Payer: Self-pay | Admitting: Internal Medicine

## 2022-03-30 ENCOUNTER — Encounter: Payer: Self-pay | Admitting: Internal Medicine

## 2022-03-30 ENCOUNTER — Inpatient Hospital Stay (HOSPITAL_BASED_OUTPATIENT_CLINIC_OR_DEPARTMENT_OTHER): Payer: Medicare PPO | Admitting: Internal Medicine

## 2022-03-30 ENCOUNTER — Inpatient Hospital Stay: Payer: Medicare PPO | Attending: Internal Medicine

## 2022-03-30 DIAGNOSIS — E079 Disorder of thyroid, unspecified: Secondary | ICD-10-CM | POA: Insufficient documentation

## 2022-03-30 DIAGNOSIS — D490 Neoplasm of unspecified behavior of digestive system: Secondary | ICD-10-CM

## 2022-03-30 DIAGNOSIS — E876 Hypokalemia: Secondary | ICD-10-CM | POA: Diagnosis not present

## 2022-03-30 DIAGNOSIS — C154 Malignant neoplasm of middle third of esophagus: Secondary | ICD-10-CM | POA: Diagnosis not present

## 2022-03-30 DIAGNOSIS — C7800 Secondary malignant neoplasm of unspecified lung: Secondary | ICD-10-CM | POA: Insufficient documentation

## 2022-03-30 LAB — COMPREHENSIVE METABOLIC PANEL
ALT: 12 U/L (ref 0–44)
AST: 16 U/L (ref 15–41)
Albumin: 3.5 g/dL (ref 3.5–5.0)
Alkaline Phosphatase: 61 U/L (ref 38–126)
Anion gap: 8 (ref 5–15)
BUN: 14 mg/dL (ref 8–23)
CO2: 27 mmol/L (ref 22–32)
Calcium: 9.6 mg/dL (ref 8.9–10.3)
Chloride: 102 mmol/L (ref 98–111)
Creatinine, Ser: 0.93 mg/dL (ref 0.44–1.00)
GFR, Estimated: 59 mL/min — ABNORMAL LOW (ref >60)
Glucose, Bld: 138 mg/dL — ABNORMAL HIGH (ref 70–99)
Potassium: 3.6 mmol/L (ref 3.5–5.1)
Sodium: 137 mmol/L (ref 135–145)
Total Bilirubin: 0.5 mg/dL (ref 0.3–1.2)
Total Protein: 7.1 g/dL (ref 6.5–8.1)

## 2022-03-30 LAB — CBC WITH DIFFERENTIAL/PLATELET
Abs Immature Granulocytes: 0.03 10*3/uL (ref 0.00–0.07)
Basophils Absolute: 0.1 10*3/uL (ref 0.0–0.1)
Basophils Relative: 1 %
Eosinophils Absolute: 0.3 10*3/uL (ref 0.0–0.5)
Eosinophils Relative: 5 %
HCT: 33.4 % — ABNORMAL LOW (ref 36.0–46.0)
Hemoglobin: 11.2 g/dL — ABNORMAL LOW (ref 12.0–15.0)
Immature Granulocytes: 0 %
Lymphocytes Relative: 26 %
Lymphs Abs: 1.8 10*3/uL (ref 0.7–4.0)
MCH: 29.9 pg (ref 26.0–34.0)
MCHC: 33.5 g/dL (ref 30.0–36.0)
MCV: 89.1 fL (ref 80.0–100.0)
Monocytes Absolute: 0.6 10*3/uL (ref 0.1–1.0)
Monocytes Relative: 9 %
Neutro Abs: 4.3 10*3/uL (ref 1.7–7.7)
Neutrophils Relative %: 59 %
Platelets: 253 10*3/uL (ref 150–400)
RBC: 3.75 MIL/uL — ABNORMAL LOW (ref 3.87–5.11)
RDW: 14 % (ref 11.5–15.5)
WBC: 7.1 10*3/uL (ref 4.0–10.5)
nRBC: 0 % (ref 0.0–0.2)

## 2022-03-30 LAB — MAGNESIUM: Magnesium: 1.4 mg/dL — ABNORMAL LOW (ref 1.7–2.4)

## 2022-03-30 NOTE — Progress Notes (Signed)
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  Starting radiation and chemotherapy from April 14, 2014 Patient was started on carboplatinum andTaxol Herve were developed an allergic reaction to Taxol so would be changed to Abraxane 3.patient has finished 6 cycles of weekly chemotherapy with carboplatinum  and radiation therapy(May 28, 2014) 4.started on  NIVOLULAMAB because of persistent disease July 02, 2014. 5.  NIVOLULAMAB was discontinued because of persistent diarrhea in July of 2016.  August of 2016 CT scan was stable so no further chemotherapy  # AUG 25th PET- STABLE RUL MASS [radiation fibrosis; <1cm ? 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   APRIL 16th, 2023- PET scan-  Resolution of metabolic activity in the mid esophagus;  Persistent hypermetabolic activity within a subcarinal lymph node. Differential includes residual carcinoma versus reactive adenopathy.  No evidence of distant metastatic disease.  # MAY 18th, 2023- s/p EUS  [Dr.Spaete]-endoscopic biopsy of the mediastinal lymph node. MAY 18th, 2023- Status post endoscopic ultrasound/biopsy of the mediastinal/subcarinal lymph node- BIOPSY POSITIVE FOR RECURRENT SQUAMOUS CELL.  # JUNE 9th, 2023- Opdivo every 2 weeks.   # AAA/ 3.3x 3.9 Stable [Dr.Schnier] -----------------------------------------------------       History of lung cancer  Malignant neoplasm of right upper lobe of lung ([place])  08/07/2019 Initial Diagnosis   Malignant neoplasm of right upper lobe of lung ([place])    INTERVAL HISTORY: Ambulating with a rolling walker.  Accompanied by her daughter Tacori Kvamme 88 y.o.  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  Patient's Opdivo is currently on HOLD given the concern for autoimmune colitis from [facility]  Patient is currently off prednisone.  Patient noted to have resolution of her diarrhea. Feels good.  Appetite is improved.  Weight stable.  She is more  up and about.   Review of Systems  Constitutional:  Positive for malaise/fatigue. Negative for chills, diaphoresis, fever and weight loss.  HENT:  Negative for nosebleeds and sore throat.   Eyes:  Negative for double vision.  Respiratory:  Negative for hemoptysis, sputum production, shortness of breath and wheezing.   Cardiovascular:  Negative for chest pain, palpitations, orthopnea and leg swelling.  Gastrointestinal:  Negative for abdominal pain, blood in stool, constipation, diarrhea, heartburn, melena, nausea and vomiting.  Genitourinary:  Negative for dysuria, frequency and urgency.  Musculoskeletal:  Negative for back pain and joint pain.  Skin: Negative.  Negative for itching and rash.  Neurological:  Negative for dizziness, tingling, focal weakness, weakness and headaches.  Endo/Heme/Allergies:  Does not bruise/bleed easily.  Psychiatric/Behavioral:  Negative for depression.  The patient is not nervous/anxious and does not have insomnia.      PAST MEDICAL HISTORY :  Past Medical History:  Diagnosis Date  . Chemotherapy induced nausea and vomiting   . Chicken pox   . Esophageal cancer ([place])   . Hypercalcemia    familial hypocalciuric hypercalcemia  . Hypercholesterolemia   . Hypertension   . Lung cancer ([place])   . Osteoporosis   . Thyroid disease     PAST SURGICAL HISTORY :   Past Surgical History:  Procedure Laterality Date  . ABDOMINAL HYSTERECTOMY     partial  . CHOLECYSTECTOMY    . COLONOSCOPY WITH PROPOFOL N/A 04/17/2017   Procedure: COLONOSCOPY WITH PROPOFOL;  Surgeon: Manya Silvas, MD;  Location: [facility] ENDOSCOPY;  Service: Endoscopy;  Laterality: N/A;  . ESOPHAGOGASTRODUODENOSCOPY (EGD) WITH PROPOFOL N/A 04/13/2021   Procedure: ESOPHAGOGASTRODUODENOSCOPY (EGD) WITH PROPOFOL;  Surgeon: [name], MD;  Location: [facility] ENDOSCOPY;  Service: Endoscopy;  Laterality: N/A;  . EUS N/A 04/22/2021   Procedure: FULL UPPER ENDOSCOPIC ULTRASOUND (EUS) RADIAL;  Surgeon: Jola Schmidt, MD;  Location: [facility] ENDOSCOPY;  Service: Endoscopy;  Laterality: N/A;  [facility] needed  . EUS N/A 10/28/2021   Procedure: UPPER ENDOSCOPIC ULTRASOUND (EUS) LINEAR;  Surgeon: Reita Cliche, MD;  Location: [facility] ENDOSCOPY;  Service: Gastroenterology;  Laterality: N/A;  [facility]  . transvaginal hysterectomy  04/18/05   with anterior colporrhaphy    FAMILY HISTORY :   Family History  Problem Relation Age of Onset  . Stroke Mother   . Hypertension Mother   . Prostate cancer Father   . Cancer Father        prostate  . Heart disease Brother        s/p CABG  . Colon cancer Neg Hx     SOCIAL HISTORY:   Social History   Tobacco Use  . Smoking status: Former    Types: Cigarettes    Quit date: 06/13/1997    Years since quitting: 24.8  . Smokeless tobacco: Never  Vaping Use  . Vaping Use: Never used  Substance Use Topics  . Alcohol use: No    Alcohol/week: 0.0  standard drinks of alcohol  . Drug use: No    ALLERGIES:  is allergic to paclitaxel.  MEDICATIONS:  Current Outpatient Medications  Medication Sig Dispense Refill  . albuterol (VENTOLIN HFA) 108 (90 Base) MCG/ACT inhaler Inhale 2 puffs into the lungs every 6 (six) hours as needed for wheezing or shortness of breath. 8 g 2  . cholecalciferol (VITAMIN D3) 25 MCG (1000 UNIT) tablet Take 1,000 Units by mouth daily.    . diphenoxylate-atropine (LOMOTIL) 2.5-0.025 MG tablet Take 1 tablet by mouth 4 (four) times daily as needed for diarrhea or loose stools. Take it along with immodium 60 tablet 0  . fluticasone (FLONASE) 50 MCG/ACT nasal spray Place 2 sprays into both nostrils daily. 16 g 3  . levocetirizine (XYZAL) 5 MG tablet Take 1 tablet (5 mg total) by mouth every evening. 90 tablet 2  . LORazepam (ATIVAN) 0.5 MG tablet Take 1 tablet (0.5 mg total) by mouth every 6 (six) hours as needed for anxiety. 30 tablet 1  . Magnesium Cl-Calcium Carbonate (SLOW MAGNESIUM/CALCIUM) 70-117 MG TBEC Take 1 tablet by mouth 2 (two) times daily. 60 tablet 6  . olmesartan-hydrochlorothiazide (BENICAR HCT) 20-12.5 MG tablet TAKE 1 TABLET BY MOUTH ONCE DAILY 90 tablet 1  .  pantoprazole (PROTONIX) 40 MG tablet TAKE 1 TABLET BY MOUTH ONCE DAILY 90 tablet 3  . loperamide (IMODIUM) 2 MG capsule Take 2 mg by mouth as needed for diarrhea or loose stools. (Patient not taking: Reported on 03/30/2022)     No current facility-administered medications for this visit.   Facility-Administered Medications Ordered in Other Visits  Medication Dose Route Frequency Provider Last Rate Last Admin  . 0.9 %  sodium chloride infusion   Intravenous Continuous Borders, Kirt Boys, NP   Stopped at 01/21/22 1247  . sodium chloride 0.9 % 1,000 mL with potassium chloride 20 mEq, magnesium sulfate 2 g infusion   Intravenous Continuous Forest Gleason, MD   Stopped at 12/04/14 1300    PHYSICAL EXAMINATION: ECOG PERFORMANCE STATUS: 0 -  Asymptomatic  BP 109/67   Pulse 92   Temp (!) 97.5 F (36.4 C) (Tympanic)   Resp 18   Wt 127 lb 8 oz (57.8 kg)   BMI 22.59 kg/m   Filed Weights   03/30/22 1100  Weight: 127 lb 8 oz (57.8 kg)       Physical Exam HENT:     Head: Normocephalic and atraumatic.     Mouth/Throat:     Pharynx: No oropharyngeal exudate.  Eyes:     Pupils: Pupils are equal, round, and reactive to light.  Cardiovascular:     Rate and Rhythm: Normal rate and regular rhythm.  Pulmonary:     Effort: Pulmonary effort is normal. No respiratory distress.     Breath sounds: Normal breath sounds. No wheezing.  Abdominal:     General: Bowel sounds are normal. There is no distension.     Palpations: Abdomen is soft. There is no mass.     Tenderness: There is no abdominal tenderness. There is no guarding or rebound.  Musculoskeletal:        General: No tenderness. Normal range of motion.     Cervical back: Normal range of motion and neck supple.  Skin:    General: Skin is warm.  Neurological:     Mental Status: She is alert and oriented to person, place, and time.  Psychiatric:        Mood and Affect: Affect normal.     LABORATORY DATA:  I have reviewed the data as listed    Component Value Date/Time   NA 137 03/30/2022 1043   NA 130 (L) 10/09/2014 0846   K 3.6 03/30/2022 1043   K 4.1 10/09/2014 0846   CL 102 03/30/2022 1043   CL 98 (L) 10/09/2014 0846   CO2 27 03/30/2022 1043   CO2 25 10/09/2014 0846   GLUCOSE 138 (H) 03/30/2022 1043   GLUCOSE 161 (H) 10/09/2014 0846   BUN 14 03/30/2022 1043   BUN 14 10/09/2014 0846   CREATININE 0.93 03/30/2022 1043   CREATININE 0.70 10/09/2014 0846   CALCIUM 9.6 03/30/2022 1043   CALCIUM 9.1 10/09/2014 0846   PROT 7.1 03/30/2022 1043   PROT 7.3 10/09/2014 0846   ALBUMIN 3.5 03/30/2022 1043   ALBUMIN 3.6 10/09/2014 0846   AST 16 03/30/2022 1043   AST 16 10/09/2014 0846   ALT 12 03/30/2022 1043   ALT 13 (L) 10/09/2014 0846   ALKPHOS 61 03/30/2022  1043   ALKPHOS 70 10/09/2014 0846   BILITOT 0.5 03/30/2022 1043   BILITOT 1.0 10/09/2014 0846   GFRNONAA 59 (L) 03/30/2022 1043   GFRNONAA >60 10/09/2014 0846   GFRAA >60 02/05/2020 1004   GFRAA >60 10/09/2014 3007  No results found for: "SPEP", "UPEP"  Lab Results  Component Value Date   WBC 7.1 03/30/2022   NEUTROABS 4.3 03/30/2022   HGB 11.2 (L) 03/30/2022   HCT 33.4 (L) 03/30/2022   MCV 89.1 03/30/2022   PLT 253 03/30/2022      Chemistry      Component Value Date/Time   NA 137 03/30/2022 1043   NA 130 (L) 10/09/2014 0846   K 3.6 03/30/2022 1043   K 4.1 10/09/2014 0846   CL 102 03/30/2022 1043   CL 98 (L) 10/09/2014 0846   CO2 27 03/30/2022 1043   CO2 25 10/09/2014 0846   BUN 14 03/30/2022 1043   BUN 14 10/09/2014 0846   CREATININE 0.93 03/30/2022 1043   CREATININE 0.70 10/09/2014 0846   GLU 111 03/18/2014 1048      Component Value Date/Time   CALCIUM 9.6 03/30/2022 1043   CALCIUM 9.1 10/09/2014 0846   ALKPHOS 61 03/30/2022 1043   ALKPHOS 70 10/09/2014 0846   AST 16 03/30/2022 1043   AST 16 10/09/2014 0846   ALT 12 03/30/2022 1043   ALT 13 (L) 10/09/2014 0846   BILITOT 0.5 03/30/2022 1043   BILITOT 1.0 10/09/2014 0846       RADIOGRAPHIC STUDIES: I have personally reviewed the radiological images as listed and agreed with the findings in the report. No results found.    ASSESSMENT & PLAN:  Neoplasm of middle third of esophagus #Squamous cell carcinoma of the mid-esophagus-NOV 2022[facility] advanced;  s/p  concurrent chemoradiation  [finished Jan 25 th, 2023].  APRIL 16th, 2023- PET scan-  Resolution of metabolic activity in the mid esophagus; however persistent hypermetabolic activity within a subcarinal lymph node. MAY 18th, 2023- Status post endoscopic ultrasound/biopsy of the mediastinal/subcarinal lymph node- BIOPSY POSITIVE FOR RECURRENT SQUAMOUS CELL. [JUNE 9th, 2023]- OPDIVO q 2 W x4 cycles-January 26 2022-PET scan: Resolution of the previously  described hypermetabolic subcarinal node. No evidence of hypermetabolic residual or recurrent disease.   # Continue to hold Opdivo at this time given patient's recent episode of severe diarrhea-likely related to Opdivo/immunotherapy. Could consider restarting or holding based on further imaging. Ordered PET scan today.   # Hypomagnesemia- [jlu 2023]-due to magnesium 1.4; continue Slow-Mag.    # Mild  Anemia- improved- Hb 11-12 sec to chemo-STABLE. Recommend gentle iron.   # PBF-BG- 161  overall stable.  Monitor for now [Gluerna protein shakes]-hopefully improve as patient is currently off steroids.  Monitor for now  #Hx of goitre-/ TSH low-however normal free T3-T4- .currently on surveillance s/p evaluation with  Dr. Forde Dandy, [facility] endocrinology. STABLE.   # Vaccinations: ok with Flu shot; and Covid-19.; ok with RSV  # IV access: PIV.   # DISPOSITION:   # follow up in 4 weeks; - MD; labs- cbc/cmp;thyroid profile;iron studies; ferritin;  magnesium;  NO treatment; PET scan prior-- - Dr.B          Orders Placed This Encounter  Procedures  . NM PET Image Restage (PS) Skull Base to Thigh (F-18 FDG)    Standing Status:   Future    Standing Expiration Date:   03/31/2023    Order Specific Question:   If indicated for the ordered procedure, I authorize the administration of a radiopharmaceutical per Radiology protocol    Answer:   Yes    Order Specific Question:   Preferred imaging location?    Answer:   [facility]  . CBC with Differential/Platelet    Standing Status:  Future    Standing Expiration Date:   03/30/2023  . Comprehensive metabolic panel    Standing Status:   Future    Standing Expiration Date:   03/30/2023  . Thyroid Panel With TSH    Standing Status:   Future    Standing Expiration Date:   03/31/2023  . Iron and TIBC([facility])    Standing Status:   Future    Standing Expiration Date:   03/31/2023  . Ferritin    Standing Status:   Future     Standing Expiration Date:   03/31/2023  . Magnesium    Standing Status:   Future    Standing Expiration Date:   03/30/2023    All questions were answered. The patient knows to call the clinic with any problems, questions or concerns.      Cammie Sickle, MD 03/30/2022 12:32 PM

## 2022-03-30 NOTE — Progress Notes (Signed)
Patient denies new problems/concerns today.   °

## 2022-03-30 NOTE — Assessment & Plan Note (Addendum)
#  Squamous cell carcinoma of the mid-esophagus-NOV 2022[facility] advanced;  s/p  concurrent chemoradiation  [finished Jan 25 th, 2023].  APRIL 16th, 2023- PET scan-  Resolution of metabolic activity in the mid esophagus; however persistent hypermetabolic activity within a subcarinal lymph node. MAY 18th, 2023- Status post endoscopic ultrasound/biopsy of the mediastinal/subcarinal lymph node- BIOPSY POSITIVE FOR RECURRENT SQUAMOUS CELL. [JUNE 9th, 2023]- OPDIVO q 2 W x4 cycles-January 26 2022-PET scan: Resolution of the previously described hypermetabolic subcarinal node. No evidence of hypermetabolic residual or recurrent disease.   # Continue to hold Opdivo at this time given patient's recent episode of severe diarrhea-likely related to Opdivo/immunotherapy. Could consider restarting or holding based on further imaging. Ordered PET scan today.   # Hypomagnesemia- [jlu 2023]-due to magnesium 1.4; continue Slow-Mag.    # Mild  Anemia- improved- Hb 11-12 sec to chemo-STABLE. Recommend gentle iron.   # PBF-BG- 138- Monitor for now [Gluerna protein shakes] improve as patient is currently off steroids.  Monitor for now  #Hx of goitre-/ TSH low-however normal free T3-T4- .currently on surveillance s/p evaluation with  Dr. Forde Dandy, [facility] endocrinology. STABLE.   # Vaccinations: ok with Flu shot; and Covid-19.; ok with RSV  # IV access: PIV.   # DISPOSITION:   # follow up in 4 weeks; - MD; labs- cbc/cmp;thyroid profile;iron studies; ferritin;  magnesium;  NO treatment; PET scan prior-- - Dr.B

## 2022-03-31 ENCOUNTER — Encounter: Payer: Self-pay | Admitting: Internal Medicine

## 2022-03-31 LAB — THYROID PANEL WITH TSH
Free Thyroxine Index: 2.2 (ref 1.2–4.9)
T3 Uptake Ratio: 26 % (ref 24–39)
T4, Total: 8.6 ug/dL (ref 4.5–12.0)
TSH: 0.347 u[IU]/mL — ABNORMAL LOW (ref 0.450–4.500)

## 2022-04-06 ENCOUNTER — Telehealth: Payer: Self-pay | Admitting: *Deleted

## 2022-04-06 NOTE — Telephone Encounter (Signed)
I called to ask patient what medicine she restarted last week and she retracted that she started medicine last week and said that she had stopped taking the Lomotil because her stools had slowed down , then her loose stools started back yesterday and so she restarted Lomotil today and is asking what to do and if she needs her stools tested.

## 2022-04-06 NOTE — Telephone Encounter (Signed)
Per Dr B, patient to continue taking Lomotil and if no better tomorrow to cal back to get appointment with [facility]. Patient informed and in agreement with plan

## 2022-04-06 NOTE — Telephone Encounter (Signed)
Patient called reporting that she is having loose, not watery stools. It started last week when she started her medicine again. Sh estates there is no blood in it and that she has been 5 times this morning. She has been using Imodium over the weekend for it. She is wondering if you want to check to see if there is any infection there. Please advise

## 2022-04-07 ENCOUNTER — Other Ambulatory Visit: Payer: Self-pay | Admitting: *Deleted

## 2022-04-07 DIAGNOSIS — R197 Diarrhea, unspecified: Secondary | ICD-10-CM

## 2022-04-07 MED ORDER — DIPHENOXYLATE-ATROPINE 2.5-0.025 MG PO TABS: 1.0000 | ORAL_TABLET | Freq: Four times a day (QID) | ORAL | 1 refills | Status: DC | PRN

## 2022-04-11 ENCOUNTER — Other Ambulatory Visit: Payer: Self-pay | Admitting: *Deleted

## 2022-04-11 ENCOUNTER — Telehealth: Payer: Self-pay | Admitting: *Deleted

## 2022-04-11 ENCOUNTER — Inpatient Hospital Stay (HOSPITAL_BASED_OUTPATIENT_CLINIC_OR_DEPARTMENT_OTHER): Payer: Medicare PPO | Admitting: Hospice and Palliative Medicine

## 2022-04-11 ENCOUNTER — Encounter (INDEPENDENT_AMBULATORY_CARE_PROVIDER_SITE_OTHER): Payer: Self-pay

## 2022-04-11 ENCOUNTER — Other Ambulatory Visit: Payer: Self-pay

## 2022-04-11 ENCOUNTER — Inpatient Hospital Stay: Payer: Medicare PPO

## 2022-04-11 ENCOUNTER — Other Ambulatory Visit: Payer: Self-pay | Admitting: Hospice and Palliative Medicine

## 2022-04-11 VITALS — BP 127/57 | HR 80 | Temp 97.5°F | Resp 18 | Ht 63.0 in | Wt 125.7 lb

## 2022-04-11 DIAGNOSIS — R197 Diarrhea, unspecified: Secondary | ICD-10-CM

## 2022-04-11 DIAGNOSIS — E079 Disorder of thyroid, unspecified: Secondary | ICD-10-CM | POA: Diagnosis not present

## 2022-04-11 DIAGNOSIS — D490 Neoplasm of unspecified behavior of digestive system: Secondary | ICD-10-CM | POA: Diagnosis not present

## 2022-04-11 DIAGNOSIS — C154 Malignant neoplasm of middle third of esophagus: Secondary | ICD-10-CM | POA: Diagnosis not present

## 2022-04-11 DIAGNOSIS — E876 Hypokalemia: Secondary | ICD-10-CM | POA: Diagnosis not present

## 2022-04-11 DIAGNOSIS — C7800 Secondary malignant neoplasm of unspecified lung: Secondary | ICD-10-CM | POA: Diagnosis not present

## 2022-04-11 LAB — CBC WITH DIFFERENTIAL/PLATELET
Abs Immature Granulocytes: 0.05 10*3/uL (ref 0.00–0.07)
Basophils Absolute: 0 10*3/uL (ref 0.0–0.1)
Basophils Relative: 0 %
Eosinophils Absolute: 0.1 10*3/uL (ref 0.0–0.5)
Eosinophils Relative: 1 %
HCT: 31.9 % — ABNORMAL LOW (ref 36.0–46.0)
Hemoglobin: 10.6 g/dL — ABNORMAL LOW (ref 12.0–15.0)
Immature Granulocytes: 1 %
Lymphocytes Relative: 24 %
Lymphs Abs: 2.2 10*3/uL (ref 0.7–4.0)
MCH: 29.9 pg (ref 26.0–34.0)
MCHC: 33.2 g/dL (ref 30.0–36.0)
MCV: 89.9 fL (ref 80.0–100.0)
Monocytes Absolute: 1.3 10*3/uL — ABNORMAL HIGH (ref 0.1–1.0)
Monocytes Relative: 14 %
Neutro Abs: 5.5 10*3/uL (ref 1.7–7.7)
Neutrophils Relative %: 60 %
Platelets: 279 10*3/uL (ref 150–400)
RBC: 3.55 MIL/uL — ABNORMAL LOW (ref 3.87–5.11)
RDW: 14 % (ref 11.5–15.5)
WBC: 9.1 10*3/uL (ref 4.0–10.5)
nRBC: 0 % (ref 0.0–0.2)

## 2022-04-11 LAB — COMPREHENSIVE METABOLIC PANEL
ALT: 11 U/L (ref 0–44)
AST: 16 U/L (ref 15–41)
Albumin: 3.7 g/dL (ref 3.5–5.0)
Alkaline Phosphatase: 60 U/L (ref 38–126)
Anion gap: 6 (ref 5–15)
BUN: 15 mg/dL (ref 8–23)
CO2: 27 mmol/L (ref 22–32)
Calcium: 9.7 mg/dL (ref 8.9–10.3)
Chloride: 102 mmol/L (ref 98–111)
Creatinine, Ser: 0.79 mg/dL (ref 0.44–1.00)
GFR, Estimated: >60 mL/min (ref >60)
Glucose, Bld: 101 mg/dL — ABNORMAL HIGH (ref 70–99)
Potassium: 3.4 mmol/L — ABNORMAL LOW (ref 3.5–5.1)
Sodium: 135 mmol/L (ref 135–145)
Total Bilirubin: 0.4 mg/dL (ref 0.3–1.2)
Total Protein: 7.3 g/dL (ref 6.5–8.1)

## 2022-04-11 LAB — MAGNESIUM: Magnesium: 1.5 mg/dL — ABNORMAL LOW (ref 1.7–2.4)

## 2022-04-11 MED ORDER — SLOW MAGNESIUM/CALCIUM 70-117 MG PO TBEC: 1.0000 | DELAYED_RELEASE_TABLET | Freq: Two times a day (BID) | ORAL | 6 refills | Status: DC

## 2022-04-11 MED ORDER — POTASSIUM CHLORIDE 10 MEQ/100ML IV SOLN
10.0000 meq | Freq: Once | INTRAVENOUS | Status: AC
  Administered 2022-04-11: 10 meq via INTRAVENOUS
  Filled 2022-04-11: qty 100

## 2022-04-11 MED ORDER — MAGNESIUM SULFATE 2 GM/50ML IV SOLN
2.0000 g | Freq: Once | INTRAVENOUS | Status: AC
  Administered 2022-04-11: 2 g via INTRAVENOUS
  Filled 2022-04-11: qty 50

## 2022-04-11 MED ORDER — SODIUM CHLORIDE 0.9 % IV SOLN
INTRAVENOUS | Status: DC
  Filled 2022-04-11 (×2): qty 250

## 2022-04-11 MED ORDER — POTASSIUM CHLORIDE CRYS ER 10 MEQ PO TBCR: 10.0000 meq | EXTENDED_RELEASE_TABLET | Freq: Every day | ORAL | 0 refills | Status: DC

## 2022-04-11 MED ORDER — PREDNISONE 20 MG PO TABS: ORAL_TABLET | ORAL | 0 refills | Status: DC

## 2022-04-11 MED ORDER — DEXAMETHASONE SODIUM PHOSPHATE 10 MG/ML IJ SOLN
4.0000 mg | Freq: Once | INTRAMUSCULAR | Status: AC
  Administered 2022-04-11: 4 mg via INTRAVENOUS
  Filled 2022-04-11: qty 1

## 2022-04-11 NOTE — Telephone Encounter (Signed)
Spoke with patient. Apt given for lab/ smc/ infusion at 11am.

## 2022-04-11 NOTE — Progress Notes (Signed)
Symptom Management [facility] [phone] [phone]  Patient Care Team: [name], MD as PCP - General (Internal Medicine) Clent Jacks, RN as Oncology Nurse Navigator Cammie Sickle, MD as Consulting Physician (Oncology)   NAME OF PATIENT: Heather Cooper  [id number]  11-25-1930   DATE OF VISIT: 04/11/22  REASON FOR CONSULT: WILBERTA DORVIL is a 88 y.o. 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  She is status post concurrent chemoradiation.  Patient was most recently on second line Opdivo.  INTERVAL HISTORY: Patient has history of recurrent/chronic diarrhea.  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  Decision was made to hold Keytruda and patient was started on steroids.  Patient last saw Dr. Rogue Bussing on 03/30/2022 at which time patient had resolution of diarrhea with improved appetite.  Plan was to reimage and then consider possible restarting treatment.  Patient presents [facility] today with recurrent diarrhea.  She says her diarrhea recurred 1 to 2 weeks ago when she started taking iron supplementation and weaned off the prednisone.  She says that soon after taking the iron her stools became dark but she denies any overt bloody stools.  She has some nausea local abdominal cramping but no pain.  No nausea or vomiting.  No GU symptoms.  No fever or chills. Patient offers no further specific complaints today.  Patient reports several loose, watery, nonmucous and non-bloody stools daily.  She has been taking the Lomotil without significant treatment.   PAST MEDICAL HISTORY: Past Medical History:  Diagnosis Date   Chemotherapy induced nausea and vomiting     Chicken pox    Esophageal cancer (HCC)    Hypercalcemia    familial hypocalciuric hypercalcemia   Hypercholesterolemia    Hypertension    Lung cancer ([place])    Osteoporosis    Thyroid disease     PAST SURGICAL HISTORY:  Past Surgical History:  Procedure Laterality Date   ABDOMINAL HYSTERECTOMY     partial   CHOLECYSTECTOMY     COLONOSCOPY WITH PROPOFOL N/A 04/17/2017   Procedure: COLONOSCOPY WITH PROPOFOL;  Surgeon: Manya Silvas, MD;  Location: [facility] ENDOSCOPY;  Service: Endoscopy;  Laterality: N/A;   ESOPHAGOGASTRODUODENOSCOPY (EGD) WITH PROPOFOL N/A 04/13/2021   Procedure: ESOPHAGOGASTRODUODENOSCOPY (EGD) WITH PROPOFOL;  Surgeon: [name], MD;  Location: [facility] ENDOSCOPY;  Service: Endoscopy;  Laterality: N/A;   EUS N/A 04/22/2021   Procedure: FULL UPPER ENDOSCOPIC ULTRASOUND (EUS) RADIAL;  Surgeon: Jola Schmidt, MD;  Location: [facility] ENDOSCOPY;  Service: Endoscopy;  Laterality: N/A;  [facility] needed   EUS N/A 10/28/2021   Procedure: UPPER ENDOSCOPIC ULTRASOUND (EUS) LINEAR;  Surgeon: Reita Cliche, MD;  Location: [facility] ENDOSCOPY;  Service: Gastroenterology;  Laterality: N/A;  [facility]   transvaginal hysterectomy  04/18/05   with anterior colporrhaphy    HEMATOLOGY/ONCOLOGY HISTORY:  Oncology History Overview Note  # 2015-patient is an 88 year old female with 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  Starting radiation and chemotherapy from April 14, 2014 Patient was started on carboplatinum andTaxol Herve were developed an allergic reaction to Taxol so would be changed to Abraxane 3.patient has finished 6 cycles of weekly chemotherapy with carboplatinum  and radiation therapy(May 28, 2014) 4.started on  NIVOLULAMAB because of persistent disease July 02, 2014. 5.  NIVOLULAMAB was discontinued because of persistent diarrhea in July of 2016.  August of 2016 CT scan was stable so no further chemotherapy  # AUG 25th PET- STABLE RUL MASS [radiation fibrosis; <1cm ? 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   APRIL 16th, 2023- PET scan-  Resolution of metabolic activity in the mid esophagus;  Persistent hypermetabolic activity within a subcarinal lymph node. Differential includes residual carcinoma versus reactive adenopathy.  No evidence of distant metastatic disease.  # MAY 18th, 2023- s/p EUS [Dr.Spaete]-endoscopic biopsy of the mediastinal lymph node. MAY 18th, 2023- Status post endoscopic ultrasound/biopsy of the mediastinal/subcarinal lymph node- BIOPSY POSITIVE FOR RECURRENT SQUAMOUS CELL.  # JUNE 9th, 2023- Opdivo every 2 weeks.   # AAA/ 3.3x 3.9 Stable [Dr.Schnier] -----------------------------------------------------       History of lung cancer  Malignant neoplasm of right upper lobe of lung ([place])  08/07/2019 Initial Diagnosis   Malignant neoplasm of right upper lobe of lung (HCC)     ALLERGIES:  is allergic to paclitaxel.  MEDICATIONS:  Current Outpatient Medications  Medication Sig Dispense Refill   albuterol (VENTOLIN HFA) 108 (90 Base) MCG/ACT inhaler Inhale 2 puffs into the lungs every 6 (six) hours as needed for wheezing or shortness of breath. 8 g 2   bismuth subsalicylate (PEPTO BISMOL) 262 MG/15ML suspension Take 30 mLs by mouth every 6 (six) hours as needed.     cholecalciferol (VITAMIN D3) 25 MCG (1000 UNIT) tablet Take 1,000 Units by mouth daily.     diphenoxylate-atropine (LOMOTIL) 2.5-0.025 MG tablet Take 1 tablet by mouth 4 (four) times daily as needed for diarrhea or loose stools. Take it along with immodium 60 tablet 1   fluticasone (FLONASE) 50 MCG/ACT  nasal spray Place 2 sprays into both nostrils daily. 16 g 3   levocetirizine (XYZAL) 5 MG tablet Take 1 tablet (5 mg total) by mouth every evening. 90 tablet 2   LORazepam (ATIVAN) 0.5 MG tablet Take 1 tablet (0.5 mg total) by mouth every 6 (six) hours as needed for anxiety. 30 tablet 1   Magnesium Cl-Calcium Carbonate (SLOW MAGNESIUM/CALCIUM) 70-117 MG TBEC Take 1 tablet by mouth 2 (two) times daily. 60 tablet 6   olmesartan-hydrochlorothiazide (BENICAR HCT) 20-12.5 MG tablet TAKE 1 TABLET BY MOUTH ONCE DAILY 90 tablet 1   pantoprazole (PROTONIX) 40 MG tablet TAKE 1 TABLET BY MOUTH ONCE DAILY 90 tablet 3   loperamide (IMODIUM) 2 MG capsule Take 2 mg by mouth as needed for diarrhea or loose stools. (Patient not taking: Reported on 04/11/2022)     No current facility-administered medications for this visit.   Facility-Administered Medications Ordered in Other Visits  Medication Dose Route Frequency Provider Last Rate Last Admin   0.9 %  sodium chloride infusion   Intravenous Continuous Aidin Doane, Kirt Boys, NP   Stopped at 01/21/22 1247   sodium chloride 0.9 % 1,000 mL with potassium chloride 20 mEq, magnesium sulfate 2 g infusion   Intravenous Continuous Forest Gleason, MD   Stopped at 12/04/14 1300    VITAL SIGNS: There were no vitals taken for this visit. There were no vitals filed for this visit.  Estimated body mass index is 22.59 kg/m as calculated from the following:   Height as of 02/25/22: _0  (1.6 m).   Weight as of 03/30/22: 127 lb 8 oz (57.8 kg).  LABS: CBC:    Component Value Date/Time   WBC 9.1 04/11/2022 1054   HGB 10.6 (L) 04/11/2022 1054   HGB 10.5 (L) 10/09/2014 0846   HCT 31.9 (L) 04/11/2022 1054   HCT 30.7 (L) 10/09/2014 0846   PLT 279 04/11/2022 1054   PLT 218 10/09/2014 0846   MCV 89.9 04/11/2022 1054   MCV 85 10/09/2014 0846   NEUTROABS 5.5 04/11/2022 1054   NEUTROABS 9.7 (H) 10/09/2014 0846   LYMPHSABS 2.2 04/11/2022 1054   LYMPHSABS 0.5 (L) 10/09/2014 0846    MONOABS 1.3 (H) 04/11/2022 1054   MONOABS 0.8 10/09/2014 0846   EOSABS 0.1 04/11/2022 1054   EOSABS 0.1 10/09/2014 0846   BASOSABS 0.0 04/11/2022 1054   BASOSABS 0.0 10/09/2014 0846   Comprehensive Metabolic Panel:    Component Value Date/Time   NA 135 04/11/2022 1054   NA 130 (L) 10/09/2014 0846   K 3.4 (L) 04/11/2022 1054   K 4.1 10/09/2014 0846   CL 102 04/11/2022 1054   CL 98 (L) 10/09/2014 0846   CO2 27 04/11/2022 1054   CO2 25 10/09/2014 0846   BUN 15 04/11/2022 1054   BUN 14 10/09/2014 0846   CREATININE 0.79 04/11/2022 1054   CREATININE 0.70 10/09/2014 0846   GLUCOSE 101 (H) 04/11/2022 1054   GLUCOSE 161 (H) 10/09/2014 0846   CALCIUM 9.7 04/11/2022 1054   CALCIUM 9.1 10/09/2014 0846   AST 16 04/11/2022 1054   AST 16 10/09/2014 0846   ALT 11 04/11/2022 1054   ALT 13 (L) 10/09/2014 0846   ALKPHOS 60 04/11/2022 1054   ALKPHOS 70 10/09/2014 0846   BILITOT 0.4 04/11/2022 1054   BILITOT 1.0 10/09/2014 0846   PROT 7.3 04/11/2022 1054   PROT 7.3 10/09/2014 0846   ALBUMIN 3.7 04/11/2022 1054   ALBUMIN 3.6 10/09/2014 0846    RADIOGRAPHIC STUDIES: No results found.  PERFORMANCE STATUS (ECOG) : 1 - Symptomatic but completely ambulatory  Review of Systems Unless otherwise noted, a complete review of systems is negative.  Physical Exam General: NAD Cardiovascular: regular rate and rhythm Pulmonary: clear ant fields Abdomen: soft, nontender, + bowel sounds GU: no suprapubic tenderness Extremities: no edema, no joint deformities Skin: no rashes Neurological: Weakness but otherwise nonfocal  IMPRESSION/PLAN: Diarrhea -diarrhea was thought secondary to immunotherapy related colitis and had mostly resolved on prednisone but seems to have recurred as steroids were discontinued.  We will check stool studies, C. difficile PCR, and fecal calprotectin.  Restart prednisone taper.  We will see if we can get her PET scan moved up, which is already scheduled for next week.    Hypokalemia/hypomagnesemia -secondary to diarrhea.  Will replete and restart oral supplementation.  Proceed with IV fluids and IV steroids today  RTC next week for repeat labs/fluids/SMC  Patient expressed understanding and was in agreement with this plan. She also understands that She can call clinic at any time with any questions, concerns, or complaints.   Thank you for allowing me to participate in the care of this very pleasant patient.   Time Total: 20 minutes  Visit consisted of counseling and education dealing with the complex and emotionally intense issues of symptom management in the setting of serious illness.Greater than 50%  of this time was spent counseling and coordinating care  related to the above assessment and plan.  Signed by: Altha Harm, PhD, NP-C

## 2022-04-11 NOTE — Telephone Encounter (Signed)
Patient called reporting that she is still having issues with diarrhea and that she has been 5 times this morning already. She is asking to come in to be evaluated. Please advise

## 2022-04-11 NOTE — Progress Notes (Signed)
Patient reports abdominal cramping and multiple loose stools per day. Patient taking lomotil as directed for the diarrhea. Denies any fevers, shortness of breath or nausea.

## 2022-04-12 ENCOUNTER — Ambulatory Visit (INDEPENDENT_AMBULATORY_CARE_PROVIDER_SITE_OTHER): Payer: Medicare PPO | Admitting: Internal Medicine

## 2022-04-12 ENCOUNTER — Other Ambulatory Visit: Payer: Self-pay

## 2022-04-12 VITALS — BP 130/70 | HR 70 | Temp 97.6°F | Ht 63.0 in | Wt 126.0 lb

## 2022-04-12 DIAGNOSIS — E876 Hypokalemia: Secondary | ICD-10-CM | POA: Diagnosis not present

## 2022-04-12 DIAGNOSIS — D649 Anemia, unspecified: Secondary | ICD-10-CM

## 2022-04-12 DIAGNOSIS — J439 Emphysema, unspecified: Secondary | ICD-10-CM | POA: Diagnosis not present

## 2022-04-12 DIAGNOSIS — E079 Disorder of thyroid, unspecified: Secondary | ICD-10-CM | POA: Diagnosis not present

## 2022-04-12 DIAGNOSIS — C159 Malignant neoplasm of esophagus, unspecified: Secondary | ICD-10-CM

## 2022-04-12 DIAGNOSIS — E1165 Type 2 diabetes mellitus with hyperglycemia: Secondary | ICD-10-CM | POA: Diagnosis not present

## 2022-04-12 DIAGNOSIS — E7800 Pure hypercholesterolemia, unspecified: Secondary | ICD-10-CM | POA: Diagnosis not present

## 2022-04-12 DIAGNOSIS — C7800 Secondary malignant neoplasm of unspecified lung: Secondary | ICD-10-CM | POA: Diagnosis not present

## 2022-04-12 DIAGNOSIS — C154 Malignant neoplasm of middle third of esophagus: Secondary | ICD-10-CM | POA: Diagnosis not present

## 2022-04-12 DIAGNOSIS — I700 Atherosclerosis of aorta: Secondary | ICD-10-CM

## 2022-04-12 DIAGNOSIS — I714 Abdominal aortic aneurysm, without rupture, unspecified: Secondary | ICD-10-CM | POA: Diagnosis not present

## 2022-04-12 DIAGNOSIS — R739 Hyperglycemia, unspecified: Secondary | ICD-10-CM

## 2022-04-12 DIAGNOSIS — K219 Gastro-esophageal reflux disease without esophagitis: Secondary | ICD-10-CM

## 2022-04-12 DIAGNOSIS — R197 Diarrhea, unspecified: Secondary | ICD-10-CM

## 2022-04-12 DIAGNOSIS — I10 Essential (primary) hypertension: Secondary | ICD-10-CM

## 2022-04-12 LAB — C DIFFICILE QUICK SCREEN W PCR REFLEX
C Diff antigen: NEGATIVE
C Diff interpretation: NOT DETECTED
C Diff toxin: NEGATIVE

## 2022-04-12 NOTE — Progress Notes (Signed)
Patient ID: Heather Cooper, female   DOB: [birth year], 88 y.o.   MRN: [id number]   Subjective:    Patient ID: Heather Cooper, female    DOB: [birth year], 88 y.o.   MRN: [id number]   Patient here for a scheduled follow up.  [name]   HPI Here to follow up regarding hypercholesterolemia, hypertension and currently being treated for esophageal cancer.  Seeing Dr Rogue Bussing.  Has had issues with persistent diarrhea.  Eulogio Bear was held and she was started on steroids.  Diarrhea improved initially.  Was reevaluated 04/11/22 - recurrent diarrhea. Stool studies ordered yesterday.  Restarted prednisone taper.  Planning for PET scan.  Potassium and magnesium - replaced.  Also taking lamotil.  Reports no chest pain.  Breathing stable.  Eating.  Blood pressure ok.     Past Medical History:  Diagnosis Date   Chemotherapy induced nausea and vomiting    Chicken pox    Esophageal cancer (HCC)    Hypercalcemia    familial hypocalciuric hypercalcemia   Hypercholesterolemia    Hypertension    Lung cancer ([place])    Osteoporosis    Thyroid disease    Past Surgical History:  Procedure Laterality Date   ABDOMINAL HYSTERECTOMY     partial   CHOLECYSTECTOMY     COLONOSCOPY WITH PROPOFOL N/A 04/17/2017   Procedure: COLONOSCOPY WITH PROPOFOL;  Surgeon: Manya Silvas, MD;  Location: [facility] ENDOSCOPY;  Service: Endoscopy;  Laterality: N/A;   ESOPHAGOGASTRODUODENOSCOPY (EGD) WITH PROPOFOL N/A 04/13/2021   Procedure: ESOPHAGOGASTRODUODENOSCOPY (EGD) WITH PROPOFOL;  Surgeon: [name], MD;  Location: [facility] ENDOSCOPY;  Service: Endoscopy;  Laterality: N/A;   EUS N/A 04/22/2021   Procedure: FULL UPPER ENDOSCOPIC ULTRASOUND (EUS) RADIAL;  Surgeon: Jola Schmidt, MD;  Location: [facility] ENDOSCOPY;  Service: Endoscopy;  Laterality: N/A;  [facility] needed   EUS N/A 10/28/2021   Procedure: UPPER ENDOSCOPIC ULTRASOUND (EUS) LINEAR;  Surgeon: Reita Cliche, MD;  Location: [facility] ENDOSCOPY;  Service: Gastroenterology;  Laterality:  N/A;  [facility]   transvaginal hysterectomy  04/18/05   with anterior colporrhaphy   Family History  Problem Relation Age of Onset   Stroke Mother    Hypertension Mother    Prostate cancer Father    Cancer Father        prostate   Heart disease Brother        s/p CABG   Colon cancer Neg Hx    Social History   Socioeconomic History   Marital status: Widowed    Spouse name: Not on file   Number of children: 3   Years of education: Not on file   Highest education level: Not on file  Occupational History   Not on file  Tobacco Use   Smoking status: Former    Types: Cigarettes    Quit date: 06/13/1997    Years since quitting: 24.8   Smokeless tobacco: Never  Vaping Use   Vaping Use: Never used  Substance and Sexual Activity   Alcohol use: No    Alcohol/week: 0.0 standard drinks of alcohol   Drug use: No   Sexual activity: Not Currently  Other Topics Concern   Not on file  Social History Narrative   Lost her husband November 2019   Social Determinants of Health   Financial Resource Strain: Low Risk  (01/25/2021)   Overall Financial Resource Strain (CARDIA)    Difficulty of Paying Living Expenses: Not hard at all  Food Insecurity: No Food Insecurity (04/11/2022)   Hunger  Vital Sign    Worried About Charity fundraiser in the Last Year: Never true    Ran Out of Food in the Last Year: Never true  Transportation Needs: No Transportation Needs (04/11/2022)   PRAPARE - Hydrologist (Medical): No    Lack of Transportation (Non-Medical): No  Physical Activity: Unknown (01/25/2021)   Exercise Vital Sign    Days of Exercise per Week: 0 days    Minutes of Exercise per Session: Not on file  Stress: No Stress Concern Present (04/11/2022)   [place]    Feeling of Stress : Not at all  Social Connections: Socially Isolated (04/11/2022)   Social Connection and Isolation Panel [NHANES]     Frequency of Communication with Friends and Family: Never    Frequency of Social Gatherings with Friends and Family: Never    Attends Religious Services: Never    Marine scientist or Organizations: No    Attends Archivist Meetings: Never    Marital Status: Widowed     Review of Systems  Constitutional:  Negative for appetite change and unexpected weight change.  HENT:  Negative for congestion and sinus pressure.   Respiratory:  Negative for cough, chest tightness and shortness of breath.   Cardiovascular:  Negative for chest pain, palpitations and leg swelling.  Gastrointestinal:  Positive for diarrhea. Negative for abdominal pain, nausea and vomiting.  Genitourinary:  Negative for difficulty urinating and dysuria.  Musculoskeletal:  Negative for joint swelling and myalgias.  Skin:  Negative for color change and rash.  Neurological:  Negative for dizziness, light-headedness and headaches.  Psychiatric/Behavioral:  Negative for agitation and dysphoric mood.        Objective:     There were no vitals taken for this visit. Wt Readings from Last 3 Encounters:  04/11/22 125 lb 11.2 oz (57 kg)  03/30/22 127 lb 8 oz (57.8 kg)  02/25/22 124 lb 12.8 oz (56.6 kg)    Physical Exam Vitals reviewed.  Constitutional:      General: She is not in acute distress.    Appearance: Normal appearance.  HENT:     Head: Normocephalic and atraumatic.     Right Ear: External ear normal.     Left Ear: External ear normal.  Eyes:     General: No scleral icterus.       Right eye: No discharge.        Left eye: No discharge.     Conjunctiva/sclera: Conjunctivae normal.  Neck:     Thyroid: No thyromegaly.  Cardiovascular:     Rate and Rhythm: Normal rate and regular rhythm.  Pulmonary:     Effort: No respiratory distress.     Breath sounds: Normal breath sounds. No wheezing.  Abdominal:     General: Bowel sounds are normal.     Palpations: Abdomen is soft.     Tenderness:  There is no abdominal tenderness.  Musculoskeletal:        General: No swelling or tenderness.     Cervical back: Neck supple. No tenderness.  Lymphadenopathy:     Cervical: No cervical adenopathy.  Skin:    Findings: No erythema or rash.  Neurological:     Mental Status: She is alert.  Psychiatric:        Mood and Affect: Mood normal.        Behavior: Behavior normal.      Outpatient Encounter  Medications as of 04/12/2022  Medication Sig   albuterol (VENTOLIN HFA) 108 (90 Base) MCG/ACT inhaler Inhale 2 puffs into the lungs every 6 (six) hours as needed for wheezing or shortness of breath.   bismuth subsalicylate (PEPTO BISMOL) 262 MG/15ML suspension Take 30 mLs by mouth every 6 (six) hours as needed.   cholecalciferol (VITAMIN D3) 25 MCG (1000 UNIT) tablet Take 1,000 Units by mouth daily.   diphenoxylate-atropine (LOMOTIL) 2.5-0.025 MG tablet Take 1 tablet by mouth 4 (four) times daily as needed for diarrhea or loose stools. Take it along with immodium   fluticasone (FLONASE) 50 MCG/ACT nasal spray Place 2 sprays into both nostrils daily.   levocetirizine (XYZAL) 5 MG tablet Take 1 tablet (5 mg total) by mouth every evening.   loperamide (IMODIUM) 2 MG capsule Take 2 mg by mouth as needed for diarrhea or loose stools.   Magnesium Cl-Calcium Carbonate (SLOW MAGNESIUM/CALCIUM) 70-117 MG TBEC Take 1 tablet by mouth 2 (two) times daily.   olmesartan-hydrochlorothiazide (BENICAR HCT) 20-12.5 MG tablet TAKE 1 TABLET BY MOUTH ONCE DAILY   pantoprazole (PROTONIX) 40 MG tablet TAKE 1 TABLET BY MOUTH ONCE DAILY   potassium chloride (KLOR-CON M) 10 MEQ tablet Take 1 tablet (10 mEq total) by mouth daily.   predniSONE (DELTASONE) 20 MG tablet Take 1 tablet (19m) by mouth x 1 week, then take 1/2 tablet (149m x 1 week, then stop   [DISCONTINUED] LORazepam (ATIVAN) 0.5 MG tablet Take 1 tablet (0.5 mg total) by mouth every 6 (six) hours as needed for anxiety.   [DISCONTINUED] prochlorperazine  (COMPAZINE) 10 MG tablet Take 1 tablet (10 mg total) by mouth every 6 (six) hours as needed (Nausea or vomiting). (Patient not taking: Reported on 09/29/2021)   Facility-Administered Encounter Medications as of 04/12/2022  Medication   0.9 %  sodium chloride infusion   sodium chloride 0.9 % 1,000 mL with potassium chloride 20 mEq, magnesium sulfate 2 g infusion     Lab Results  Component Value Date   WBC 9.1 04/11/2022   HGB 10.6 (L) 04/11/2022   HCT 31.9 (L) 04/11/2022   PLT 279 04/11/2022   GLUCOSE 101 (H) 04/11/2022   CHOL 171 08/26/2021   TRIG 182.0 (H) 08/26/2021   HDL 39.10 08/26/2021   LDLCALC 96 08/26/2021   ALT 11 04/11/2022   AST 16 04/11/2022   NA 135 04/11/2022   K 3.4 (L) 04/11/2022   CL 102 04/11/2022   CREATININE 0.79 04/11/2022   BUN 15 04/11/2022   CO2 27 04/11/2022   TSH 0.347 (L) 03/30/2022   INR 1.0 03/18/2014   INR 1.0 03/18/2014   HGBA1C 5.5 08/26/2021   MICROALBUR 5.2 (H) 01/19/2021    NM PET Image Restage (PS) Skull Base to Thigh (F-18 FDG)  Result Date: 01/27/2022 CLINICAL DATA:  Subsequent treatment strategy for esophageal cancer involving the mid third. Remote history of lung cancer. Final radiation therapy in January. Endoscopic ultrasound biopsy 10/28/2021 demonstrated recurrence. Immunotherapy including in July of 2023. EXAM: NUCLEAR MEDICINE PET SKULL BASE TO THIGH TECHNIQUE: 7.2 mCi F-18 FDG was injected intravenously. Full-ring PET imaging was performed from the skull base to thigh after the radiotracer. CT data was obtained and used for attenuation correction and anatomic localization. Fasting blood glucose: 115 mg/dl COMPARISON:  09/27/2021 FINDINGS: Mediastinal blood pool activity: SUV max 1.8 Liver activity: SUV max NA NECK: No areas of abnormal hypermetabolism. Incidental CT findings: No cervical adenopathy. Left carotid atherosclerosis. CHEST: The previously described hypermetabolic subcarinal node has  resolved. Only minimal nodal tissue  remains in this region. No residual or recurrent esophageal hypermetabolism. Incidental CT findings: Centrilobular emphysema with paramediastinal radiation fibrosis within the right greater than left upper lobes. ABDOMEN/PELVIS: No abdominopelvic parenchymal or nodal hypermetabolism. Incidental CT findings: Cholecystectomy. Common duct dilatation at 1.4 cm on 124/2 is similar and has been present back to at least 07/03/2018 CT. Normal adrenal glands. Normal noncontrast appearance of the liver, gallbladder, kidneys, spleen. Scattered colonic diverticula. Hysterectomy. Pelvic floor laxity. Infrarenal abdominal aortic aneurysm is similar at 4.1 cm. No surrounding hemorrhage. SKELETON: No abnormal marrow activity. Incidental CT findings: Osteopenia. IMPRESSION: 1. Resolution of the previously described hypermetabolic subcarinal node. No evidence of hypermetabolic residual or recurrent disease. 2. Infrarenal abdominal aortic aneurysm, similar at 4.1 cm. Recommend follow-up every 12 months and vascular consultation. This recommendation follows ACR consensus guidelines: White Paper of the ACR Incidental Findings Committee II on Vascular Findings. J Am Coll Radiol 2013; 10:789-794. 3. Incidental findings, including: Aortic atherosclerosis (ICD10-I70.0), coronary artery atherosclerosis and emphysema (ICD10-J43.9). Electronically Signed   By: Abigail Miyamoto M.D.   On: 01/27/2022 20:49       Assessment & Plan:   Problem List Items Addressed This Visit     Abdominal aortic aneurysm ([place])    Followed by Dr Delana Meyer (08/06/20)  Stable.  Recommended f/u in 12 months.  Discussed.  She has been in contact with [facility].  Will hold until current treatments complete and then plans f/u. Planning f/u PET scan soon.       Anemia    Follow cbc.  Being followed by hematology.       Aortic atherosclerosis (HCC)    Not on cholesterol medication.  Continue blood pressure control.       Diarrhea    Diarrhea as outlined.  Recently  treated with prednisone with noted improvement.  Now flared again.  Back on prednisone.  Stool studies ordered.  Has lamotil.       Emphysema of lung (HCC)    Breathing stable.       Esophageal cancer [facility])    Being followed by radiation oncology and oncology.  Eating.  Has received chemo and XRT.  Follow       GERD (gastroesophageal reflux disease)    Continues on protonix.  No acid reflux reported.       Hypercholesterolemia    Follow lipid panel.       Hyperglycemia    Low carb diet and exercise.  Follow met b and a1c.       Hypertension    Continue benicar/hctz.  Blood pressure has been doing well.   Follow pressures.  Follow metabolic panel.       Type 2 diabetes mellitus with hyperglycemia (HCC) - Primary    Hold on medication.  Follow met b and a1c.       Relevant Orders   HgB A1c     [name], MD

## 2022-04-13 ENCOUNTER — Telehealth: Payer: Self-pay | Admitting: *Deleted

## 2022-04-13 LAB — GASTROINTESTINAL PANEL BY PCR, STOOL (REPLACES STOOL CULTURE)

## 2022-04-13 LAB — CALPROTECTIN, FECAL: Calprotectin, Fecal: >8000 ug/g — ABNORMAL HIGH (ref 0–120)

## 2022-04-13 MED ORDER — PREDNISONE 10 MG PO TABS: ORAL_TABLET | ORAL | 0 refills | Status: DC

## 2022-04-13 NOTE — Telephone Encounter (Signed)
Called report Gastrointestinal Panel by PCR , Stool Order: [id number] Status: Final result    Visible to patient: Yes (not seen)    Next appt: 04/15/2022 at 08:30 AM in Radiology ([facility]PET CT1)    0 Result Notes       Component Ref Range & Units 1 d ago 2 mo ago 3 mo ago  Campylobacter species NOT DETECTED NOT DETECTED  NOT DETECTED  NOT DETECTED   Plesimonas shigelloides NOT DETECTED NOT DETECTED  NOT DETECTED  NOT DETECTED   Salmonella species NOT DETECTED NOT DETECTED  NOT DETECTED  NOT DETECTED   Yersinia enterocolitica NOT DETECTED NOT DETECTED  NOT DETECTED  NOT DETECTED   Vibrio species NOT DETECTED NOT DETECTED  NOT DETECTED  NOT DETECTED   Vibrio cholerae NOT DETECTED NOT DETECTED  NOT DETECTED  NOT DETECTED   Enteroaggregative E coli (EAEC) NOT DETECTED NOT DETECTED  NOT DETECTED  NOT DETECTED   Enteropathogenic E coli (EPEC) NOT DETECTED DETECTED Abnormal   DETECTED Abnormal  CM  DETECTED Abnormal  CM   Enterotoxigenic E coli (ETEC) NOT DETECTED NOT DETECTED  NOT DETECTED  NOT DETECTED   Shiga like toxin producing E coli (STEC) NOT DETECTED NOT DETECTED  NOT DETECTED  NOT DETECTED   Shigella/Enteroinvasive E coli (EIEC) NOT DETECTED NOT DETECTED  NOT DETECTED  NOT DETECTED   Cryptosporidium NOT DETECTED NOT DETECTED  NOT DETECTED  NOT DETECTED   Cyclospora cayetanensis NOT DETECTED NOT DETECTED  NOT DETECTED  NOT DETECTED   Entamoeba histolytica NOT DETECTED NOT DETECTED  NOT DETECTED  NOT DETECTED   Giardia lamblia NOT DETECTED NOT DETECTED  NOT DETECTED  NOT DETECTED   Adenovirus F40/41 NOT DETECTED NOT DETECTED  NOT DETECTED  NOT DETECTED   Astrovirus NOT DETECTED NOT DETECTED  NOT DETECTED  NOT DETECTED   Norovirus GI/GII NOT DETECTED NOT DETECTED  NOT DETECTED  NOT DETECTED   Rotavirus A NOT DETECTED NOT DETECTED  NOT DETECTED  NOT DETECTED   Sapovirus (I, II, IV, and V) NOT DETECTED NOT DETECTED  NOT DETECTED CM  NOT DETECTED CM   Comment: Performed at [facility], [address] [place], [place] [address]  Resulting Agency  [facility] [place] [facility] [place] [facility]

## 2022-04-13 NOTE — Telephone Encounter (Signed)
Results acknowledge. Discussed with Merrily Pew, NP. He will review chart/plan with Dr. Rogue Bussing.

## 2022-04-13 NOTE — Telephone Encounter (Signed)
Case discussed with Dr. Rogue Bussing.  Most likely, patient is colonized with EPEC and repeat antibiotics was not recommended.  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  I did call and speak with patient he reports that the diarrhea has improved some and is a stool was thickening after restarting steroids.  We will slowly taper steroids over 1 month.

## 2022-04-15 ENCOUNTER — Ambulatory Visit
Admission: RE | Admit: 2022-04-15 | Discharge: 2022-04-15 | Disposition: A | Discharge status: Home / Self Care | Payer: Medicare PPO | Source: Ambulatory Visit | Attending: Internal Medicine | Admitting: Internal Medicine

## 2022-04-15 DIAGNOSIS — I7143 Infrarenal abdominal aortic aneurysm, without rupture: Secondary | ICD-10-CM | POA: Diagnosis not present

## 2022-04-15 DIAGNOSIS — Z01818 Encounter for other preprocedural examination: Secondary | ICD-10-CM | POA: Diagnosis not present

## 2022-04-15 DIAGNOSIS — I700 Atherosclerosis of aorta: Secondary | ICD-10-CM | POA: Insufficient documentation

## 2022-04-15 DIAGNOSIS — D490 Neoplasm of unspecified behavior of digestive system: Secondary | ICD-10-CM | POA: Diagnosis not present

## 2022-04-15 DIAGNOSIS — C159 Malignant neoplasm of esophagus, unspecified: Secondary | ICD-10-CM | POA: Insufficient documentation

## 2022-04-15 DIAGNOSIS — J439 Emphysema, unspecified: Secondary | ICD-10-CM | POA: Insufficient documentation

## 2022-04-15 LAB — GLUCOSE, CAPILLARY: Glucose-Capillary: 104 mg/dL — ABNORMAL HIGH (ref 70–99)

## 2022-04-15 MED ORDER — FLUDEOXYGLUCOSE F - 18 (FDG) INJECTION
6.5000 | Freq: Once | INTRAVENOUS | Status: AC | PRN
  Administered 2022-04-15: 6.89 via INTRAVENOUS

## 2022-04-17 ENCOUNTER — Encounter: Payer: Self-pay | Admitting: Internal Medicine

## 2022-04-17 NOTE — Assessment & Plan Note (Signed)
Breathing stable.

## 2022-04-17 NOTE — Assessment & Plan Note (Signed)
Follow lipid panel.   

## 2022-04-17 NOTE — Assessment & Plan Note (Signed)
Being followed by radiation oncology and oncology.  Eating.  Has received chemo and XRT.  Follow

## 2022-04-17 NOTE — Assessment & Plan Note (Signed)
Follow cbc.  Being followed by hematology.

## 2022-04-17 NOTE — Assessment & Plan Note (Signed)
Diarrhea as outlined.  Recently treated with prednisone with noted improvement.  Now flared again.  Back on prednisone.  Stool studies ordered.  Has lamotil.

## 2022-04-17 NOTE — Assessment & Plan Note (Signed)
Hold on medication.  Follow met b and a1c.

## 2022-04-17 NOTE — Assessment & Plan Note (Signed)
Continues on protonix.  No acid reflux reported.

## 2022-04-17 NOTE — Assessment & Plan Note (Signed)
Followed by Dr Delana Meyer (08/06/20)  Stable.  Recommended f/u in 12 months.  Discussed.  She has been in contact with [facility].  Will hold until current treatments complete and then plans f/u. Planning f/u PET scan soon.

## 2022-04-17 NOTE — Assessment & Plan Note (Signed)
Not on cholesterol medication.  Continue blood pressure control.

## 2022-04-17 NOTE — Assessment & Plan Note (Signed)
Low carb diet and exercise.  Follow met b and a1c.  

## 2022-04-17 NOTE — Assessment & Plan Note (Signed)
Continue benicar/hctz.  Blood pressure has been doing well.   Follow pressures.  Follow metabolic panel.

## 2022-04-18 ENCOUNTER — Other Ambulatory Visit: Payer: Self-pay

## 2022-04-18 ENCOUNTER — Inpatient Hospital Stay: Payer: Medicare PPO

## 2022-04-18 ENCOUNTER — Inpatient Hospital Stay (HOSPITAL_BASED_OUTPATIENT_CLINIC_OR_DEPARTMENT_OTHER): Payer: Medicare PPO | Admitting: Hospice and Palliative Medicine

## 2022-04-18 ENCOUNTER — Encounter: Payer: Self-pay | Admitting: Hospice and Palliative Medicine

## 2022-04-18 ENCOUNTER — Inpatient Hospital Stay: Payer: Medicare PPO | Attending: Internal Medicine

## 2022-04-18 ENCOUNTER — Ambulatory Visit (INDEPENDENT_AMBULATORY_CARE_PROVIDER_SITE_OTHER): Payer: Medicare PPO | Admitting: Vascular Surgery

## 2022-04-18 ENCOUNTER — Encounter (INDEPENDENT_AMBULATORY_CARE_PROVIDER_SITE_OTHER): Payer: Medicare PPO

## 2022-04-18 VITALS — BP 147/66 | HR 72 | Temp 96.9°F | Resp 20 | Ht 63.0 in | Wt 122.5 lb

## 2022-04-18 DIAGNOSIS — E876 Hypokalemia: Secondary | ICD-10-CM

## 2022-04-18 DIAGNOSIS — A040 Enteropathogenic Escherichia coli infection: Secondary | ICD-10-CM | POA: Diagnosis not present

## 2022-04-18 DIAGNOSIS — C7800 Secondary malignant neoplasm of unspecified lung: Secondary | ICD-10-CM | POA: Insufficient documentation

## 2022-04-18 DIAGNOSIS — C154 Malignant neoplasm of middle third of esophagus: Secondary | ICD-10-CM | POA: Diagnosis not present

## 2022-04-18 DIAGNOSIS — R197 Diarrhea, unspecified: Secondary | ICD-10-CM

## 2022-04-18 DIAGNOSIS — D490 Neoplasm of unspecified behavior of digestive system: Secondary | ICD-10-CM | POA: Diagnosis not present

## 2022-04-18 DIAGNOSIS — R195 Other fecal abnormalities: Secondary | ICD-10-CM

## 2022-04-18 DIAGNOSIS — C781 Secondary malignant neoplasm of mediastinum: Secondary | ICD-10-CM | POA: Insufficient documentation

## 2022-04-18 DIAGNOSIS — E860 Dehydration: Secondary | ICD-10-CM | POA: Insufficient documentation

## 2022-04-18 LAB — COMPREHENSIVE METABOLIC PANEL
ALT: 14 U/L (ref 0–44)
AST: 15 U/L (ref 15–41)
Albumin: 4.1 g/dL (ref 3.5–5.0)
Alkaline Phosphatase: 63 U/L (ref 38–126)
Anion gap: 9 (ref 5–15)
BUN: 26 mg/dL — ABNORMAL HIGH (ref 8–23)
CO2: 28 mmol/L (ref 22–32)
Calcium: 10.5 mg/dL — ABNORMAL HIGH (ref 8.9–10.3)
Chloride: 95 mmol/L — ABNORMAL LOW (ref 98–111)
Creatinine, Ser: 1.02 mg/dL — ABNORMAL HIGH (ref 0.44–1.00)
GFR, Estimated: 53 mL/min — ABNORMAL LOW (ref >60)
Glucose, Bld: 143 mg/dL — ABNORMAL HIGH (ref 70–99)
Potassium: 4.7 mmol/L (ref 3.5–5.1)
Sodium: 132 mmol/L — ABNORMAL LOW (ref 135–145)
Total Bilirubin: 0.6 mg/dL (ref 0.3–1.2)
Total Protein: 7.7 g/dL (ref 6.5–8.1)

## 2022-04-18 LAB — CBC WITH DIFFERENTIAL/PLATELET
Abs Immature Granulocytes: 0.2 10*3/uL — ABNORMAL HIGH (ref 0.00–0.07)
Basophils Absolute: 0 10*3/uL (ref 0.0–0.1)
Basophils Relative: 0 %
Eosinophils Absolute: 0 10*3/uL (ref 0.0–0.5)
Eosinophils Relative: 0 %
HCT: 36.8 % (ref 36.0–46.0)
Hemoglobin: 12.2 g/dL (ref 12.0–15.0)
Immature Granulocytes: 1 %
Lymphocytes Relative: 13 %
Lymphs Abs: 2.2 10*3/uL (ref 0.7–4.0)
MCH: 29.6 pg (ref 26.0–34.0)
MCHC: 33.2 g/dL (ref 30.0–36.0)
MCV: 89.3 fL (ref 80.0–100.0)
Monocytes Absolute: 0.9 10*3/uL (ref 0.1–1.0)
Monocytes Relative: 5 %
Neutro Abs: 13.9 10*3/uL — ABNORMAL HIGH (ref 1.7–7.7)
Neutrophils Relative %: 81 %
Platelets: 334 10*3/uL (ref 150–400)
RBC: 4.12 MIL/uL (ref 3.87–5.11)
RDW: 13.6 % (ref 11.5–15.5)
WBC: 17.3 10*3/uL — ABNORMAL HIGH (ref 4.0–10.5)
nRBC: 0 % (ref 0.0–0.2)

## 2022-04-18 LAB — MAGNESIUM: Magnesium: 1.8 mg/dL (ref 1.7–2.4)

## 2022-04-18 MED ORDER — SODIUM CHLORIDE 0.9 % IV SOLN
Freq: Once | INTRAVENOUS | Status: AC
  Filled 2022-04-18: qty 250

## 2022-04-18 NOTE — Patient Instructions (Addendum)
Hold your oral supplements (Potassium and magnesium) for now and we will recheck labs later this week.

## 2022-04-18 NOTE — Progress Notes (Signed)
Symptom Management [place] at [facility] [phone] [phone]  Patient Care Team: [name], MD as PCP - General (Internal Medicine) Clent Jacks, RN as Oncology Nurse Navigator Cammie Sickle, MD as Consulting Physician (Oncology)   NAME OF PATIENT: Heather Cooper  [id number]  09-27-1930   DATE OF VISIT: 04/18/22  REASON FOR CONSULT: Heather Cooper is a 88 y.o. 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  She is status post concurrent chemoradiation.  Patient was most recently on second line Opdivo.  INTERVAL HISTORY: Patient has history of recurrent/chronic diarrhea.  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  Decision was made to hold Keytruda and patient was started on steroids.  Patient has had intermittent diarrhea thought secondary to immunotherapy.  She is on slow prednisone taper.  Patient presents [facility] today for follow-up/labs.  Today, patient reports that she is feeling significantly improved.  She denies any symptomatic complaints at present.  She reports resolution of diarrhea.  Daughter feels like patient is still not drinking enough fluids  Patient denies fever or chills.  No nausea or vomiting, diarrhea or constipation.  No pain.  No shortness of breath or chest pain.  Appetite is fair.  No GU symptoms.  Patient denies other symptomatic complaints or concerns.   PAST MEDICAL HISTORY: Past Medical History:  Diagnosis Date   Chemotherapy induced nausea and vomiting    Chicken pox    Esophageal cancer (HCC)    Hypercalcemia    familial hypocalciuric hypercalcemia   Hypercholesterolemia    Hypertension    Lung cancer ([place])    Osteoporosis     Thyroid disease     PAST SURGICAL HISTORY:  Past Surgical History:  Procedure Laterality Date   ABDOMINAL HYSTERECTOMY     partial   CHOLECYSTECTOMY     COLONOSCOPY WITH PROPOFOL N/A 04/17/2017   Procedure: COLONOSCOPY WITH PROPOFOL;  Surgeon: Manya Silvas, MD;  Location: [facility] ENDOSCOPY;  Service: Endoscopy;  Laterality: N/A;   ESOPHAGOGASTRODUODENOSCOPY (EGD) WITH PROPOFOL N/A 04/13/2021   Procedure: ESOPHAGOGASTRODUODENOSCOPY (EGD) WITH PROPOFOL;  Surgeon: [name], MD;  Location: [facility] ENDOSCOPY;  Service: Endoscopy;  Laterality: N/A;   EUS N/A 04/22/2021   Procedure: FULL UPPER ENDOSCOPIC ULTRASOUND (EUS) RADIAL;  Surgeon: Jola Schmidt, MD;  Location: [facility] ENDOSCOPY;  Service: Endoscopy;  Laterality: N/A;  [facility] needed   EUS N/A 10/28/2021   Procedure: UPPER ENDOSCOPIC ULTRASOUND (EUS) LINEAR;  Surgeon: Reita Cliche, MD;  Location: [facility] ENDOSCOPY;  Service: Gastroenterology;  Laterality: N/A;  [facility]   transvaginal hysterectomy  04/18/05   with anterior colporrhaphy    HEMATOLOGY/ONCOLOGY HISTORY:  Oncology History Overview Note  # 2015-patient is an 88 year old female with 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  Starting radiation and chemotherapy from April 14, 2014 Patient was started on carboplatinum andTaxol Herve were developed an allergic reaction to Taxol so would be changed to Abraxane 3.patient has finished 6 cycles of weekly chemotherapy with carboplatinum  and  radiation therapy(May 28, 2014) 4.started on  NIVOLULAMAB because of persistent disease July 02, 2014. 5.  NIVOLULAMAB was discontinued because of persistent diarrhea in July of 2016.  August of 2016  CT scan was stable so no further chemotherapy  # AUG 25th PET- STABLE RUL MASS [radiation fibrosis; <1cm ? 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   APRIL 16th, 2023- PET scan-  Resolution of metabolic activity in the mid esophagus;  Persistent hypermetabolic activity within a subcarinal lymph node. Differential includes residual carcinoma versus reactive adenopathy.  No evidence of distant metastatic disease.  # MAY 18th, 2023- s/p EUS [Dr.Spaete]-endoscopic biopsy of the mediastinal lymph node. MAY 18th, 2023- Status post endoscopic ultrasound/biopsy of the mediastinal/subcarinal lymph node- BIOPSY POSITIVE FOR RECURRENT SQUAMOUS CELL.  # JUNE 9th, 2023- Opdivo every 2 weeks.   # AAA/ 3.3x 3.9 Stable [Dr.Schnier] -----------------------------------------------------       History of lung cancer  Malignant neoplasm of right upper lobe of lung ([place])  08/07/2019 Initial Diagnosis   Malignant neoplasm of right upper lobe of lung (HCC)     ALLERGIES:  is allergic to paclitaxel.  MEDICATIONS:  Current Outpatient Medications  Medication Sig Dispense Refill   albuterol (VENTOLIN HFA) 108 (90 Base) MCG/ACT inhaler Inhale 2 puffs into the lungs every 6 (six) hours as needed for wheezing or shortness of breath. 8 g 2   bismuth subsalicylate (PEPTO BISMOL) 262 MG/15ML suspension Take 30 mLs by mouth every 6 (six) hours as needed.     cholecalciferol (VITAMIN D3) 25 MCG (1000 UNIT) tablet Take 1,000 Units by mouth daily.     diphenoxylate-atropine (LOMOTIL) 2.5-0.025 MG tablet Take 1 tablet by mouth 4 (four) times daily as needed for diarrhea or loose stools. Take it along with immodium 60 tablet 1   fluticasone (FLONASE) 50 MCG/ACT nasal spray Place 2 sprays into both nostrils daily. 16 g 3   levocetirizine (XYZAL) 5 MG tablet Take 1 tablet (5 mg total) by mouth every evening. 90 tablet 2   loperamide (IMODIUM) 2  MG capsule Take 2 mg by mouth as needed for diarrhea or loose stools.     Magnesium Cl-Calcium Carbonate (SLOW MAGNESIUM/CALCIUM) 70-117 MG TBEC Take 1 tablet by mouth 2 (two) times daily. 60 tablet 6   olmesartan-hydrochlorothiazide (BENICAR HCT) 20-12.5 MG tablet TAKE 1 TABLET BY MOUTH ONCE DAILY 90 tablet 1   pantoprazole (PROTONIX) 40 MG tablet TAKE 1 TABLET BY MOUTH ONCE DAILY 90 tablet 3   potassium chloride (KLOR-CON M) 10 MEQ tablet Take 1 tablet (10 mEq total) by mouth daily. 10 tablet 0   predniSONE (DELTASONE) 10 MG tablet For use after current prescription is completed. Take 1 tablet (10 mg) by mouth daily x1 week, then take half tablet (5 mg) by mouth daily x1 week, then stop 14 tablet 0   predniSONE (DELTASONE) 20 MG tablet Take 1 tablet (62m) by mouth x 1 week, then take 1/2 tablet (119m x 1 week, then stop 14 tablet 0   No current facility-administered medications for this visit.   Facility-Administered Medications Ordered in Other Visits  Medication Dose Route Frequency Provider Last Rate Last Admin   0.9 %  sodium chloride infusion   Intravenous Continuous Ved Martos, JoKirt BoysNP   Stopped at 01/21/22 1247   sodium chloride 0.9 % 1,000 mL with potassium chloride 20 mEq, magnesium sulfate 2 g infusion   Intravenous Continuous ChForest GleasonMD  Stopped at 12/04/14 1300    VITAL SIGNS: There were no vitals taken for this visit. There were no vitals filed for this visit.  Estimated body mass index is 22.32 kg/m as calculated from the following:   Height as of 04/12/22: _0  (1.6 m).   Weight as of 04/12/22: 126 lb (57.2 kg).  LABS: CBC:    Component Value Date/Time   WBC 17.3 (H) 04/18/2022 0906   HGB 12.2 04/18/2022 0906   HGB 10.5 (L) 10/09/2014 0846   HCT 36.8 04/18/2022 0906   HCT 30.7 (L) 10/09/2014 0846   PLT 334 04/18/2022 0906   PLT 218 10/09/2014 0846   MCV 89.3 04/18/2022 0906   MCV 85 10/09/2014 0846   NEUTROABS 13.9 (H) 04/18/2022 0906   NEUTROABS  9.7 (H) 10/09/2014 0846   LYMPHSABS 2.2 04/18/2022 0906   LYMPHSABS 0.5 (L) 10/09/2014 0846   MONOABS 0.9 04/18/2022 0906   MONOABS 0.8 10/09/2014 0846   EOSABS 0.0 04/18/2022 0906   EOSABS 0.1 10/09/2014 0846   BASOSABS 0.0 04/18/2022 0906   BASOSABS 0.0 10/09/2014 0846   Comprehensive Metabolic Panel:    Component Value Date/Time   NA 135 04/11/2022 1054   NA 130 (L) 10/09/2014 0846   K 3.4 (L) 04/11/2022 1054   K 4.1 10/09/2014 0846   CL 102 04/11/2022 1054   CL 98 (L) 10/09/2014 0846   CO2 27 04/11/2022 1054   CO2 25 10/09/2014 0846   BUN 15 04/11/2022 1054   BUN 14 10/09/2014 0846   CREATININE 0.79 04/11/2022 1054   CREATININE 0.70 10/09/2014 0846   GLUCOSE 101 (H) 04/11/2022 1054   GLUCOSE 161 (H) 10/09/2014 0846   CALCIUM 9.7 04/11/2022 1054   CALCIUM 9.1 10/09/2014 0846   AST 16 04/11/2022 1054   AST 16 10/09/2014 0846   ALT 11 04/11/2022 1054   ALT 13 (L) 10/09/2014 0846   ALKPHOS 60 04/11/2022 1054   ALKPHOS 70 10/09/2014 0846   BILITOT 0.4 04/11/2022 1054   BILITOT 1.0 10/09/2014 0846   PROT 7.3 04/11/2022 1054   PROT 7.3 10/09/2014 0846   ALBUMIN 3.7 04/11/2022 1054   ALBUMIN 3.6 10/09/2014 0846    RADIOGRAPHIC STUDIES: No results found.  PERFORMANCE STATUS (ECOG) : 1 - Symptomatic but completely ambulatory  Review of Systems Unless otherwise noted, a complete review of systems is negative.  Physical Exam General: NAD Cardiovascular: regular rate and rhythm Pulmonary: clear ant fields Abdomen: soft, nontender, + bowel sounds GU: no suprapubic tenderness Extremities: no edema, no joint deformities Skin: no rashes Neurological: Weakness but otherwise nonfocal  IMPRESSION/PLAN: Diarrhea -most likely secondary to immunotherapy with fecal calprotectin greater than 8000.  Discussed with Dr. Jacinto Reap and will refer patient to GI for further evaluation.  Diarrhea appears to have resolved on steroids.  Patient will continue slow prednisone taper.    Dehydration -lab suggestive of volume deficiency with slight rise in serum creatinine/hypercalcemia/hyponatremia.  Proceed with IV fluids today  RTC later this week for repeat labs/fluids  Patient expressed understanding and was in agreement with this plan. She also understands that She can call clinic at any time with any questions, concerns, or complaints.   Thank you for allowing me to participate in the care of this very pleasant patient.   Time Total: 20 minutes  Visit consisted of counseling and education dealing with the complex and emotionally intense issues of symptom management in the setting of serious illness.Greater than 50%  of this time was spent counseling  and coordinating care related to the above assessment and plan.  Signed by: Altha Harm, PhD, NP-C

## 2022-04-19 ENCOUNTER — Ambulatory Visit: Payer: Medicare PPO

## 2022-04-20 ENCOUNTER — Other Ambulatory Visit: Payer: Medicare PPO

## 2022-04-22 ENCOUNTER — Inpatient Hospital Stay: Payer: Medicare PPO

## 2022-04-22 ENCOUNTER — Other Ambulatory Visit: Payer: Self-pay | Admitting: Hospice and Palliative Medicine

## 2022-04-22 ENCOUNTER — Telehealth: Payer: Self-pay | Admitting: *Deleted

## 2022-04-22 DIAGNOSIS — C154 Malignant neoplasm of middle third of esophagus: Secondary | ICD-10-CM | POA: Diagnosis not present

## 2022-04-22 DIAGNOSIS — C7800 Secondary malignant neoplasm of unspecified lung: Secondary | ICD-10-CM | POA: Diagnosis not present

## 2022-04-22 DIAGNOSIS — C781 Secondary malignant neoplasm of mediastinum: Secondary | ICD-10-CM | POA: Diagnosis not present

## 2022-04-22 DIAGNOSIS — R197 Diarrhea, unspecified: Secondary | ICD-10-CM

## 2022-04-22 DIAGNOSIS — E860 Dehydration: Secondary | ICD-10-CM | POA: Diagnosis not present

## 2022-04-22 DIAGNOSIS — A040 Enteropathogenic Escherichia coli infection: Secondary | ICD-10-CM

## 2022-04-22 DIAGNOSIS — E876 Hypokalemia: Secondary | ICD-10-CM

## 2022-04-22 LAB — CBC WITH DIFFERENTIAL/PLATELET
Abs Immature Granulocytes: 0.08 10*3/uL — ABNORMAL HIGH (ref 0.00–0.07)
Basophils Absolute: 0 10*3/uL (ref 0.0–0.1)
Basophils Relative: 0 %
Eosinophils Absolute: 0 10*3/uL (ref 0.0–0.5)
Eosinophils Relative: 0 %
HCT: 36.8 % (ref 36.0–46.0)
Hemoglobin: 12 g/dL (ref 12.0–15.0)
Immature Granulocytes: 1 %
Lymphocytes Relative: 28 %
Lymphs Abs: 3 10*3/uL (ref 0.7–4.0)
MCH: 29.7 pg (ref 26.0–34.0)
MCHC: 32.6 g/dL (ref 30.0–36.0)
MCV: 91.1 fL (ref 80.0–100.0)
Monocytes Absolute: 1.1 10*3/uL — ABNORMAL HIGH (ref 0.1–1.0)
Monocytes Relative: 10 %
Neutro Abs: 6.6 10*3/uL (ref 1.7–7.7)
Neutrophils Relative %: 61 %
Platelets: 260 10*3/uL (ref 150–400)
RBC: 4.04 MIL/uL (ref 3.87–5.11)
RDW: 14 % (ref 11.5–15.5)
WBC: 10.9 10*3/uL — ABNORMAL HIGH (ref 4.0–10.5)
nRBC: 0 % (ref 0.0–0.2)

## 2022-04-22 LAB — COMPREHENSIVE METABOLIC PANEL
ALT: 13 U/L (ref 0–44)
AST: 18 U/L (ref 15–41)
Albumin: 3.7 g/dL (ref 3.5–5.0)
Alkaline Phosphatase: 53 U/L (ref 38–126)
Anion gap: 8 (ref 5–15)
BUN: 26 mg/dL — ABNORMAL HIGH (ref 8–23)
CO2: 28 mmol/L (ref 22–32)
Calcium: 10.1 mg/dL (ref 8.9–10.3)
Chloride: 98 mmol/L (ref 98–111)
Creatinine, Ser: 0.92 mg/dL (ref 0.44–1.00)
GFR, Estimated: 60 mL/min — ABNORMAL LOW (ref >60)
Glucose, Bld: 155 mg/dL — ABNORMAL HIGH (ref 70–99)
Potassium: 4.2 mmol/L (ref 3.5–5.1)
Sodium: 134 mmol/L — ABNORMAL LOW (ref 135–145)
Total Bilirubin: 0.8 mg/dL (ref 0.3–1.2)
Total Protein: 6.8 g/dL (ref 6.5–8.1)

## 2022-04-22 LAB — MAGNESIUM: Magnesium: 1.7 mg/dL (ref 1.7–2.4)

## 2022-04-22 MED ORDER — PREDNISONE 20 MG PO TABS: ORAL_TABLET | ORAL | 0 refills | Status: DC

## 2022-04-22 MED ORDER — PREDNISONE 10 MG PO TABS: ORAL_TABLET | ORAL | 0 refills | Status: DC

## 2022-04-22 NOTE — Addendum Note (Signed)
Addended by: Altha Harm R on: 04/22/2022 10:54 AM   Modules accepted: Orders

## 2022-04-22 NOTE — Progress Notes (Signed)
I called and spoke with daughter to clarify prednisone taper instructions

## 2022-04-22 NOTE — Progress Notes (Signed)
Was informed by nursing staff that patient had not taken the previous prednisone taper correctly.  Daughter was giving patient 40 mg of prednisone daily.  We will send a new prescription for prednisone taper over the next 3 weeks.

## 2022-04-22 NOTE — Progress Notes (Signed)
No IVF fluids today.

## 2022-04-22 NOTE — Telephone Encounter (Signed)
Daughter requested new script for prednisone to be sent to pharmacy. Per daughter patient had been taking 60 mg of prednisone. She stated that is what the patient understood to be taking as the correct dosage. Discussed with daughter/patient that she previously looked at a different prednisone bottle instructions and thought that the instructions were the same as before. Due to this fact, patient ran out of her prednisone sooner than expected. Josh, NP sent a prescription to [facility] for the 10 mg dosing taper for prednisone. Daughter questioned the new dosing taper which led to the above conversation. Josh, NP spoke with daughter to clarify the dose the patient actually took at home. NP sent new script for the 20 mg dosing. RN contacted [facility] and discussed new script for prednisone was sent to pharmacy. I requested that the pharmacist RF the script with prednisone Take 2 tablets (40 mg) x1 week, then take 1 tablet (20 mg) x1 week, then take half tablet (10 mg) x 1 week

## 2022-04-25 ENCOUNTER — Other Ambulatory Visit: Payer: Self-pay

## 2022-04-25 DIAGNOSIS — D490 Neoplasm of unspecified behavior of digestive system: Secondary | ICD-10-CM

## 2022-04-25 DIAGNOSIS — Z5112 Encounter for antineoplastic immunotherapy: Secondary | ICD-10-CM

## 2022-04-26 ENCOUNTER — Encounter: Payer: Self-pay | Admitting: Internal Medicine

## 2022-04-26 ENCOUNTER — Inpatient Hospital Stay: Payer: Medicare PPO

## 2022-04-26 ENCOUNTER — Inpatient Hospital Stay (HOSPITAL_BASED_OUTPATIENT_CLINIC_OR_DEPARTMENT_OTHER): Payer: Medicare PPO | Admitting: Internal Medicine

## 2022-04-26 VITALS — BP 137/63 | HR 85 | Temp 96.7°F | Resp 20 | Wt 121.0 lb

## 2022-04-26 DIAGNOSIS — C7800 Secondary malignant neoplasm of unspecified lung: Secondary | ICD-10-CM | POA: Diagnosis not present

## 2022-04-26 DIAGNOSIS — E860 Dehydration: Secondary | ICD-10-CM | POA: Diagnosis not present

## 2022-04-26 DIAGNOSIS — D490 Neoplasm of unspecified behavior of digestive system: Secondary | ICD-10-CM

## 2022-04-26 DIAGNOSIS — Z2989 Encounter for other specified prophylactic measures: Secondary | ICD-10-CM | POA: Diagnosis not present

## 2022-04-26 DIAGNOSIS — C781 Secondary malignant neoplasm of mediastinum: Secondary | ICD-10-CM | POA: Diagnosis not present

## 2022-04-26 DIAGNOSIS — C154 Malignant neoplasm of middle third of esophagus: Secondary | ICD-10-CM | POA: Diagnosis not present

## 2022-04-26 DIAGNOSIS — Z5112 Encounter for antineoplastic immunotherapy: Secondary | ICD-10-CM

## 2022-04-26 LAB — IRON AND TIBC
Iron: 97 ug/dL (ref 28–170)
Saturation Ratios: 27 % (ref 10.4–31.8)
TIBC: 354 ug/dL (ref 250–450)
UIBC: 257 ug/dL

## 2022-04-26 LAB — COMPREHENSIVE METABOLIC PANEL
ALT: 15 U/L (ref 0–44)
AST: 17 U/L (ref 15–41)
Albumin: 4.1 g/dL (ref 3.5–5.0)
Alkaline Phosphatase: 58 U/L (ref 38–126)
Anion gap: 12 (ref 5–15)
BUN: 30 mg/dL — ABNORMAL HIGH (ref 8–23)
CO2: 25 mmol/L (ref 22–32)
Calcium: 9.9 mg/dL (ref 8.9–10.3)
Chloride: 91 mmol/L — ABNORMAL LOW (ref 98–111)
Creatinine, Ser: 1.12 mg/dL — ABNORMAL HIGH (ref 0.44–1.00)
GFR, Estimated: 47 mL/min — ABNORMAL LOW (ref >60)
Glucose, Bld: 184 mg/dL — ABNORMAL HIGH (ref 70–99)
Potassium: 4.7 mmol/L (ref 3.5–5.1)
Sodium: 128 mmol/L — ABNORMAL LOW (ref 135–145)
Total Bilirubin: 1.1 mg/dL (ref 0.3–1.2)
Total Protein: 7 g/dL (ref 6.5–8.1)

## 2022-04-26 LAB — CBC
HCT: 36.5 % (ref 36.0–46.0)
Hemoglobin: 12.2 g/dL (ref 12.0–15.0)
MCH: 30.2 pg (ref 26.0–34.0)
MCHC: 33.4 g/dL (ref 30.0–36.0)
MCV: 90.3 fL (ref 80.0–100.0)
Platelets: 244 10*3/uL (ref 150–400)
RBC: 4.04 MIL/uL (ref 3.87–5.11)
RDW: 13.7 % (ref 11.5–15.5)
WBC: 11.3 10*3/uL — ABNORMAL HIGH (ref 4.0–10.5)
nRBC: 0 % (ref 0.0–0.2)

## 2022-04-26 LAB — MAGNESIUM: Magnesium: 1.8 mg/dL (ref 1.7–2.4)

## 2022-04-26 LAB — FERRITIN: Ferritin: 129 ng/mL (ref 11–307)

## 2022-04-26 NOTE — Progress Notes (Signed)
[facility] OFFICE PROGRESS NOTE  Patient Care Team: [name], MD as PCP - General (Internal Medicine) Clent Jacks, RN as Oncology Nurse Navigator Cammie Sickle, MD as Consulting Physician (Oncology) [name], MD as Consulting Physician (Gastroenterology) Borders, Kirt Boys, NP as Nurse Practitioner (Hospice and Palliative Medicine) Gloris Ham, RN as Registered Nurse (Oncology)   Cancer Staging  Neoplasm of middle third of esophagus Staging form: Esophagus - Squamous Cell Carcinoma, AJCC 8th Edition - Clinical: Stage Unknown (cTX, cN1, cM0) - Signed by Cammie Sickle, MD on 05/10/2021 - Pathologic: No stage assigned - Unsigned   Oncology History Overview Note  # 2015-patient is an 88 year old female with 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  Starting radiation and chemotherapy from April 14, 2014 Patient was started on carboplatinum andTaxol Herve were developed an allergic reaction to Taxol so would be changed to Abraxane 3.patient has finished 6 cycles of weekly chemotherapy with carboplatinum  and radiation therapy(May 28, 2014) 4.started on  NIVOLULAMAB because of persistent disease July 02, 2014. 5.  NIVOLULAMAB was discontinued because of persistent diarrhea in July of 2016.  August of 2016 CT scan was stable so no further chemotherapy  # AUG 25th PET- STABLE RUL MASS [radiation fibrosis; <1cm ? 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   APRIL 16th, 2023- PET scan-  Resolution of metabolic activity in the mid esophagus;  Persistent hypermetabolic  activity within a subcarinal lymph node. Differential includes residual carcinoma versus reactive adenopathy.  No evidence of distant metastatic disease.  # MAY 18th, 2023- s/p EUS [Dr.Spaete]-endoscopic biopsy of the mediastinal lymph node. MAY 18th, 2023- Status post endoscopic ultrasound/biopsy of the mediastinal/subcarinal lymph node- BIOPSY POSITIVE FOR RECURRENT SQUAMOUS CELL.  # JUNE 9th, 2023- Opdivo every 2 weeks.   # AAA/ 3.3x 3.9 Stable [Dr.Schnier] -----------------------------------------------------       History of lung cancer  Malignant neoplasm of right upper lobe of lung ([place])  08/07/2019 Initial Diagnosis   Malignant neoplasm of right upper lobe of lung ([place])    INTERVAL HISTORY: Ambulating with a rolling walker.  Accompanied by her daughter Athalene Kolle 88 y.o.  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  Patient's Opdivo is currently on HOLD given the concern for autoimmune colitis from [facility].    Patient is on second taper of prednisone taking 40 mg for this week and she will taper down to 20 mg starting next week.  Her diarrhea has improved.  She has been feeling well overall.  Denies any pain, fever, chills.  Review of Systems  Constitutional:  Positive for malaise/fatigue. Negative for chills, diaphoresis, fever and weight loss.  HENT:  Negative for nosebleeds and sore throat.   Eyes:  Negative for double vision.  Respiratory:  Negative for hemoptysis, sputum production, shortness of breath and wheezing.   Cardiovascular:  Negative for chest pain, palpitations, orthopnea and leg swelling.  Gastrointestinal:  Negative for abdominal pain, blood in stool, constipation, diarrhea, heartburn, melena, nausea and vomiting.  Genitourinary:  Negative for dysuria, frequency and urgency.  Musculoskeletal:  Negative for back pain and joint pain.  Skin:  Negative.  Negative for itching and rash.  Neurological:  Negative for dizziness, tingling, focal weakness, weakness and headaches.  Endo/Heme/Allergies:  Does not bruise/bleed easily.  Psychiatric/Behavioral:  Negative for depression. The patient is not nervous/anxious and does not have insomnia.      PAST MEDICAL HISTORY :  Past Medical History:  Diagnosis Date   Chemotherapy induced nausea and vomiting    Chicken pox    Esophageal cancer (HCC)    Hypercalcemia    familial hypocalciuric hypercalcemia   Hypercholesterolemia    Hypertension    Lung cancer ([place])    Osteoporosis    Thyroid disease     PAST SURGICAL HISTORY :   Past Surgical History:  Procedure Laterality Date   ABDOMINAL HYSTERECTOMY     partial   CHOLECYSTECTOMY     COLONOSCOPY WITH PROPOFOL N/A 04/17/2017   Procedure: COLONOSCOPY WITH PROPOFOL;  Surgeon: Manya Silvas, MD;  Location: [facility] ENDOSCOPY;  Service: Endoscopy;  Laterality: N/A;   ESOPHAGOGASTRODUODENOSCOPY (EGD) WITH PROPOFOL N/A 04/13/2021   Procedure: ESOPHAGOGASTRODUODENOSCOPY (EGD) WITH PROPOFOL;  Surgeon: [name], MD;  Location: [facility] ENDOSCOPY;  Service: Endoscopy;  Laterality: N/A;   EUS N/A 04/22/2021   Procedure: FULL UPPER ENDOSCOPIC ULTRASOUND (EUS) RADIAL;  Surgeon: Jola Schmidt, MD;  Location: [facility] ENDOSCOPY;  Service: Endoscopy;  Laterality: N/A;  [facility] needed   EUS N/A 10/28/2021   Procedure: UPPER ENDOSCOPIC ULTRASOUND (EUS) LINEAR;  Surgeon: Reita Cliche, MD;  Location: [facility] ENDOSCOPY;  Service: Gastroenterology;  Laterality: N/A;  [facility]   transvaginal hysterectomy  04/18/05   with anterior colporrhaphy    FAMILY HISTORY :   Family History  Problem Relation Age of Onset   Stroke Mother    Hypertension Mother    Prostate cancer Father    Cancer Father        prostate   Heart disease Brother        s/p CABG   Colon cancer Neg Hx     SOCIAL HISTORY:   Social History   Tobacco Use   Smoking status: Former     Types: Cigarettes    Quit date: 06/13/1997    Years since quitting: 24.8   Smokeless tobacco: Never  Vaping Use   Vaping Use: Never used  Substance Use Topics   Alcohol use: No    Alcohol/week: 0.0 standard drinks of alcohol   Drug use: No    ALLERGIES:  is allergic to paclitaxel.  MEDICATIONS:  Current Outpatient Medications  Medication Sig Dispense Refill   cholecalciferol (VITAMIN D3) 25 MCG (1000 UNIT) tablet Take 1,000 Units by mouth daily.     olmesartan-hydrochlorothiazide (BENICAR HCT) 20-12.5 MG tablet TAKE 1 TABLET BY MOUTH ONCE DAILY 90 tablet 1   pantoprazole (PROTONIX) 40 MG tablet TAKE 1 TABLET BY MOUTH ONCE DAILY 90 tablet 3   predniSONE (DELTASONE) 20 MG tablet Take 2 tablets (40 mg) x1 week, then take 1 tablet (20 mg) x1 week, then take half tablet (10 mg) x 1 week 30 tablet 0   albuterol (VENTOLIN HFA) 108 (90 Base) MCG/ACT inhaler Inhale 2 puffs into the lungs every 6 (six) hours as needed for wheezing or shortness of breath. (Patient not taking: Reported on 04/18/2022) 8 g 2   bismuth subsalicylate (PEPTO BISMOL) 262 MG/15ML suspension Take 30 mLs by mouth every 6 (six) hours as needed. (Patient not taking: Reported  on 04/18/2022)     diphenoxylate-atropine (LOMOTIL) 2.5-0.025 MG tablet Take 1 tablet by mouth 4 (four) times daily as needed for diarrhea or loose stools. Take it along with immodium (Patient not taking: Reported on 04/18/2022) 60 tablet 1   fluticasone (FLONASE) 50 MCG/ACT nasal spray Place 2 sprays into both nostrils daily. (Patient not taking: Reported on 04/18/2022) 16 g 3   levocetirizine (XYZAL) 5 MG tablet Take 1 tablet (5 mg total) by mouth every evening. (Patient not taking: Reported on 04/18/2022) 90 tablet 2   loperamide (IMODIUM) 2 MG capsule Take 2 mg by mouth as needed for diarrhea or loose stools. (Patient not taking: Reported on 04/18/2022)     Magnesium Cl-Calcium Carbonate (SLOW MAGNESIUM/CALCIUM) 70-117 MG TBEC Take 1 tablet by mouth 2 (two)  times daily. (Patient not taking: Reported on 04/26/2022) 60 tablet 6   potassium chloride (KLOR-CON M) 10 MEQ tablet Take 1 tablet (10 mEq total) by mouth daily. (Patient not taking: Reported on 04/26/2022) 10 tablet 0   No current facility-administered medications for this visit.   Facility-Administered Medications Ordered in Other Visits  Medication Dose Route Frequency Provider Last Rate Last Admin   0.9 %  sodium chloride infusion   Intravenous Continuous Borders, Kirt Boys, NP   Stopped at 01/21/22 1247   sodium chloride 0.9 % 1,000 mL with potassium chloride 20 mEq, magnesium sulfate 2 g infusion   Intravenous Continuous Forest Gleason, MD   Stopped at 12/04/14 1300    PHYSICAL EXAMINATION: ECOG PERFORMANCE STATUS: 0 - Asymptomatic  BP 137/63   Pulse 85   Temp (!) 96.7 F (35.9 C)   Resp 20   Wt 121 lb (54.9 kg)   SpO2 100%   BMI 21.43 kg/m   Filed Weights   04/26/22 1506  Weight: 121 lb (54.9 kg)       Physical Exam HENT:     Head: Normocephalic and atraumatic.     Mouth/Throat:     Pharynx: No oropharyngeal exudate.  Eyes:     Pupils: Pupils are equal, round, and reactive to light.  Cardiovascular:     Rate and Rhythm: Normal rate and regular rhythm.  Pulmonary:     Effort: Pulmonary effort is normal. No respiratory distress.     Breath sounds: Normal breath sounds. No wheezing.  Abdominal:     General: Bowel sounds are normal. There is no distension.     Palpations: Abdomen is soft. There is no mass.     Tenderness: There is no abdominal tenderness. There is no guarding or rebound.  Musculoskeletal:        General: No tenderness. Normal range of motion.     Cervical back: Normal range of motion and neck supple.  Skin:    General: Skin is warm.  Neurological:     Mental Status: She is alert and oriented to person, place, and time.  Psychiatric:        Mood and Affect: Affect normal.      LABORATORY DATA:  I have reviewed the data as listed     Component Value Date/Time   NA 128 (L) 04/26/2022 1448   NA 130 (L) 10/09/2014 0846   K 4.7 04/26/2022 1448   K 4.1 10/09/2014 0846   CL 91 (L) 04/26/2022 1448   CL 98 (L) 10/09/2014 0846   CO2 25 04/26/2022 1448   CO2 25 10/09/2014 0846   GLUCOSE 184 (H) 04/26/2022 1448   GLUCOSE 161 (H) 10/09/2014 5625  BUN 30 (H) 04/26/2022 1448   BUN 14 10/09/2014 0846   CREATININE 1.12 (H) 04/26/2022 1448   CREATININE 0.70 10/09/2014 0846   CALCIUM 9.9 04/26/2022 1448   CALCIUM 9.1 10/09/2014 0846   PROT 7.0 04/26/2022 1448   PROT 7.3 10/09/2014 0846   ALBUMIN 4.1 04/26/2022 1448   ALBUMIN 3.6 10/09/2014 0846   AST 17 04/26/2022 1448   AST 16 10/09/2014 0846   ALT 15 04/26/2022 1448   ALT 13 (L) 10/09/2014 0846   ALKPHOS 58 04/26/2022 1448   ALKPHOS 70 10/09/2014 0846   BILITOT 1.1 04/26/2022 1448   BILITOT 1.0 10/09/2014 0846   GFRNONAA 47 (L) 04/26/2022 1448   GFRNONAA >60 10/09/2014 0846   GFRAA >60 02/05/2020 1004   GFRAA >60 10/09/2014 0846    No results found for: "SPEP", "UPEP"  Lab Results  Component Value Date   WBC 11.3 (H) 04/26/2022   NEUTROABS 6.6 04/22/2022   HGB 12.2 04/26/2022   HCT 36.5 04/26/2022   MCV 90.3 04/26/2022   PLT 244 04/26/2022      Chemistry      Component Value Date/Time   NA 128 (L) 04/26/2022 1448   NA 130 (L) 10/09/2014 0846   K 4.7 04/26/2022 1448   K 4.1 10/09/2014 0846   CL 91 (L) 04/26/2022 1448   CL 98 (L) 10/09/2014 0846   CO2 25 04/26/2022 1448   CO2 25 10/09/2014 0846   BUN 30 (H) 04/26/2022 1448   BUN 14 10/09/2014 0846   CREATININE 1.12 (H) 04/26/2022 1448   CREATININE 0.70 10/09/2014 0846   GLU 111 03/18/2014 1048      Component Value Date/Time   CALCIUM 9.9 04/26/2022 1448   CALCIUM 9.1 10/09/2014 0846   ALKPHOS 58 04/26/2022 1448   ALKPHOS 70 10/09/2014 0846   AST 17 04/26/2022 1448   AST 16 10/09/2014 0846   ALT 15 04/26/2022 1448   ALT 13 (L) 10/09/2014 0846   BILITOT 1.1 04/26/2022 1448   BILITOT 1.0  10/09/2014 0846       RADIOGRAPHIC STUDIES: I have personally reviewed the radiological images as listed and agreed with the findings in the report. No results found.    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  APRIL 16th, 2023- PET scan-  Resolution of metabolic activity in the mid esophagus; however persistent hypermetabolic activity within a subcarinal lymph node. MAY 18th, 2023- Status post endoscopic ultrasound/biopsy of the mediastinal/subcarinal lymph node- BIOPSY POSITIVE FOR RECURRENT SQUAMOUS CELL. [JUNE 9th, 2023]- OPDIVO q 2 W x4 cycles-January 26 2022-PET scan: Resolution of the previously described hypermetabolic subcarinal node. No evidence of hypermetabolic residual or recurrent disease.   Repeat PET/CT from 04/15/2022 showed no evidence of disease.   # Continue to hold Opdivo at this time given patient's recent episode of severe diarrhea-likely related to Opdivo/immunotherapy.  She will continue with her second round of prednisone taper.  She is on 40 mg this week and will taper to 20 mg next week then 10 mg for 1 week.  Considering that she had recurrence of diarrhea after first 4-week taper I discussed with the patient and the daughter that we can consider a little longer taper of 6 weeks.  She has enough prednisone to continue until he sees Dr. B in first week of December.  She will follow-up with Dr. Allen Norris  tomorrow.   # Hypomagnesemia- [jlu 2023]-normal.  1.8.  # Mild  Anemia- improved- Hb 11-12 sec to chemo-STABLE.  Iron panel normal.   # PBF-BG- 161  overall stable.  Monitor for now [Gluerna protein shakes]-hopefully improve as patient is currently off steroids.  Monitor for now   #Hx of goitre-/ TSH low-however normal free T3-T4- .currently on surveillance s/p evaluation with  Dr. Forde Dandy, [facility] endocrinology.  STABLE.    # IV access: PIV.    # DISPOSITION:    # follow up in 3 weeks for MD visit with Dr. Jacinto Reap.  No labs.   No orders of the defined types were placed in this encounter.   All questions were answered. The patient knows to call the clinic with any problems, questions or concerns.      Jane Canary, MD 04/26/2022 4:49 PM

## 2022-04-27 ENCOUNTER — Encounter: Payer: Self-pay | Admitting: Gastroenterology

## 2022-04-27 ENCOUNTER — Other Ambulatory Visit: Payer: Medicare PPO

## 2022-04-27 ENCOUNTER — Ambulatory Visit: Payer: Medicare PPO | Admitting: Internal Medicine

## 2022-04-27 ENCOUNTER — Ambulatory Visit (INDEPENDENT_AMBULATORY_CARE_PROVIDER_SITE_OTHER): Payer: Medicare PPO | Admitting: Gastroenterology

## 2022-04-27 VITALS — BP 152/76 | HR 73 | Temp 97.6°F | Wt 118.0 lb

## 2022-04-27 DIAGNOSIS — A09 Infectious gastroenteritis and colitis, unspecified: Secondary | ICD-10-CM | POA: Diagnosis not present

## 2022-04-27 NOTE — Progress Notes (Signed)
Primary Care Physician: [name], MD  Primary Gastroenterologist:  Dr. [name]  Chief Complaint  Patient presents with   Diarrhea    HPI: Heather Cooper is a 88 y.o. female here with a history of esophageal cancer found on EGD.  The patient has been following up with oncology for her squamous cell cancer of the esophagus.  The patient had reported diarrhea approximate 3 months ago and had stool studies sent off that showed enteropathogenic E. coli.  The patient had reported that her stools were getting better after she started steroids.  The patient was then scheduled for follow-up with me for her diarrhea. Fecal calprotectin was 8000 when the patient was found to have the E. coli.  She states that since she has been on the steroids she has not had any further diarrhea.  She also reports that she is due to come off the steroids in 2 weeks.  Past Medical History:  Diagnosis Date   Chemotherapy induced nausea and vomiting    Chicken pox    Esophageal cancer (HCC)    Hypercalcemia    familial hypocalciuric hypercalcemia   Hypercholesterolemia    Hypertension    Lung cancer (HCC)    Osteoporosis    Thyroid disease     Current Outpatient Medications  Medication Sig Dispense Refill   albuterol (VENTOLIN HFA) 108 (90 Base) MCG/ACT inhaler Inhale 2 puffs into the lungs every 6 (six) hours as needed for wheezing or shortness of breath. 8 g 2   bismuth subsalicylate (PEPTO BISMOL) 262 MG/15ML suspension Take 30 mLs by mouth every 6 (six) hours as needed.     cholecalciferol (VITAMIN D3) 25 MCG (1000 UNIT) tablet Take 1,000 Units by mouth daily.     diphenoxylate-atropine (LOMOTIL) 2.5-0.025 MG tablet Take 1 tablet by mouth 4 (four) times daily as needed for diarrhea or loose stools. Take it along with immodium 60 tablet 1   fluticasone (FLONASE) 50 MCG/ACT nasal spray Place 2 sprays into both nostrils daily. 16 g 3   levocetirizine (XYZAL) 5 MG tablet Take 1 tablet (5 mg  total) by mouth every evening. 90 tablet 2   loperamide (IMODIUM) 2 MG capsule Take 2 mg by mouth as needed for diarrhea or loose stools.     Magnesium Cl-Calcium Carbonate (SLOW MAGNESIUM/CALCIUM) 70-117 MG TBEC Take 1 tablet by mouth 2 (two) times daily. 60 tablet 6   olmesartan-hydrochlorothiazide (BENICAR HCT) 20-12.5 MG tablet TAKE 1 TABLET BY MOUTH ONCE DAILY 90 tablet 1   pantoprazole (PROTONIX) 40 MG tablet TAKE 1 TABLET BY MOUTH ONCE DAILY 90 tablet 3   potassium chloride (KLOR-CON M) 10 MEQ tablet Take 1 tablet (10 mEq total) by mouth daily. 10 tablet 0   predniSONE (DELTASONE) 20 MG tablet Take 2 tablets (40 mg) x1 week, then take 1 tablet (20 mg) x1 week, then take half tablet (10 mg) x 1 week 30 tablet 0   No current facility-administered medications for this visit.   Facility-Administered Medications Ordered in Other Visits  Medication Dose Route Frequency Provider Last Rate Last Admin   0.9 %  sodium chloride infusion   Intravenous Continuous Borders, Kirt Boys, NP   Stopped at 01/21/22 1247   sodium chloride 0.9 % 1,000 mL with potassium chloride 20 mEq, magnesium sulfate 2 g infusion   Intravenous Continuous Forest Gleason, MD   Stopped at 12/04/14 1300    Allergies as of 04/27/2022 - Review Complete 04/27/2022  Allergen Reaction Noted  Paclitaxel Other (See Comments) 09/13/2014    ROS:  General: Negative for anorexia, weight loss, fever, chills, fatigue, weakness. ENT: Negative for hoarseness, difficulty swallowing , nasal congestion. CV: Negative for chest pain, angina, palpitations, dyspnea on exertion, peripheral edema.  Respiratory: Negative for dyspnea at rest, dyspnea on exertion, cough, sputum, wheezing.  GI: See history of present illness. GU:  Negative for dysuria, hematuria, urinary incontinence, urinary frequency, nocturnal urination.  Endo: Negative for unusual weight change.    Physical Examination:   BP (!) 152/76 (BP Location: Left Arm, Patient  Position: Sitting, Cuff Size: Normal)   Pulse 73   Temp 97.6 F (36.4 C) (Oral)   Wt 118 lb (53.5 kg)   BMI 20.90 kg/m   General: Well-nourished, well-developed in no acute distress.  Eyes: No icterus. Conjunctivae pink. Neuro: Alert and oriented x 3.  Grossly intact. Skin: Warm and dry, no jaundice.   Psych: Alert and cooperative, normal mood and affect.  Labs:    Imaging Studies: NM PET Image Restage (PS) Skull Base to Thigh (F-18 FDG)  Result Date: 04/18/2022 CLINICAL DATA:  Subsequent treatment strategy for esophageal cancer. EXAM: NUCLEAR MEDICINE PET SKULL BASE TO THIGH TECHNIQUE: 6.89 mCi F-18 FDG was injected intravenously. Full-ring PET imaging was performed from the skull base to thigh after the radiotracer. CT data was obtained and used for attenuation correction and anatomic localization. Fasting blood glucose: 104 mg/dl COMPARISON:  Multiple priors including most recent PET-CT January 26, 2022 FINDINGS: Mediastinal blood pool activity: SUV max 1.3 Liver activity: SUV max NA NECK: No hypermetabolic cervical adenopathy. Incidental CT findings: Carotid artery calcifications . CHEST: No hypermetabolic thoracic adenopathy. No residual or recurrent esophageal hypermetabolism. No hypermetabolic pulmonary nodules or masses. Incidental CT findings: Similar right-greater-than-left perihilar postradiation fibrotic change. Aortic atherosclerosis. Coronary artery calcifications. ABDOMEN/PELVIS: No abnormal hypermetabolic activity within the liver, pancreas, adrenal glands, or spleen. No hypermetabolic lymph nodes in the abdomen or pelvis. Diffuse colonic FDG avidity is commonly physiologic/medication-related (metformin). Incidental CT findings: Post cholecystectomy. Similar dilation of the common duct measuring 1.4 cm. Measures 4.6 cm, unchanged when remeasured for consistency. Colonic diverticulosis without findings of acute diverticulitis. SKELETON: No focal hypermetabolic activity to suggest  skeletal metastasis. Incidental CT findings: Diffuse demineralization of bone. Radiation related changes in the thoracic spine. IMPRESSION: 1. No scintigraphic evidence of residual or recurrent esophageal hypermetabolism. 2. No evidence of hypermetabolic metastatic disease. 3. Similar aneurysmal dilation of the infrarenal abdominal aorta measuring 4.6 cm. Recommend follow-up CT/MR every 6 months and vascular consultation. This recommendation follows ACR consensus guidelines: White Paper of the ACR Incidental Findings Committee II on Vascular Findings. J Am Coll Radiol 2013; 01:093-235. 4. Aortic Atherosclerosis (ICD10-I70.0) and Emphysema (ICD10-J43.9). Electronically Signed   By: Dahlia Bailiff M.D.   On: 04/18/2022 11:55    Assessment and Plan:   Heather Cooper is a 88 y.o. y/o female who comes in today with a history of diarrhea that was found to have enteropathogenic E. coli on stool studies.  The patient was found to have a fecal calprotectin elevated at that time consistent with inflammation likely from the E. coli.  The patient has been told that if the diarrhea should come back after she finishes her steroids then a repeat stool test should be done and if the E. coli is back she would likely need treatment at that time.  The recommendations for treatment are below:  "Antibiotic therapy is reasonable in patients with diarrhea associated with pathogenic E. coli infection (other  than STEC such as E. coli O157:H7) that is severe (eg, with fever, more than six stools per day, volume depletion warranting hospitalization) or bloody."  The development of inflammatory bowel disease at this age is very unlikely.  The patient and her daughter have been explained the plan and agree with it.  [name], MD. Marval Regal    Note: This dictation was prepared with Dragon dictation along with smaller phrase technology. Any transcriptional errors that result from this process are unintentional.

## 2022-04-28 ENCOUNTER — Telehealth: Payer: Self-pay | Admitting: Internal Medicine

## 2022-04-28 NOTE — Telephone Encounter (Signed)
Copied from [place] [id number]. Topic: Medicare AWV >> Apr 28, 2022 11:39 AM Devoria Glassing wrote: Reason for CRM: Left message for patient to schedule Annual Wellness Visit.  Please schedule with Nurse Health Advisor Denisa O'Brien-Blaney, LPN at [facility]. This appt can be telephone or office visit.  Please call [phone] ask for [facility]

## 2022-04-29 DIAGNOSIS — H903 Sensorineural hearing loss, bilateral: Secondary | ICD-10-CM | POA: Diagnosis not present

## 2022-05-10 ENCOUNTER — Encounter: Payer: Self-pay | Admitting: Internal Medicine

## 2022-05-10 ENCOUNTER — Inpatient Hospital Stay (HOSPITAL_BASED_OUTPATIENT_CLINIC_OR_DEPARTMENT_OTHER): Payer: Medicare PPO | Admitting: Internal Medicine

## 2022-05-10 VITALS — BP 138/82 | HR 106 | Temp 98.7°F | Resp 20 | Wt 118.7 lb

## 2022-05-10 DIAGNOSIS — C7800 Secondary malignant neoplasm of unspecified lung: Secondary | ICD-10-CM | POA: Diagnosis not present

## 2022-05-10 DIAGNOSIS — D490 Neoplasm of unspecified behavior of digestive system: Secondary | ICD-10-CM | POA: Diagnosis not present

## 2022-05-10 DIAGNOSIS — C781 Secondary malignant neoplasm of mediastinum: Secondary | ICD-10-CM | POA: Diagnosis not present

## 2022-05-10 DIAGNOSIS — C154 Malignant neoplasm of middle third of esophagus: Secondary | ICD-10-CM | POA: Diagnosis not present

## 2022-05-10 DIAGNOSIS — E860 Dehydration: Secondary | ICD-10-CM | POA: Diagnosis not present

## 2022-05-10 NOTE — Assessment & Plan Note (Addendum)
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 No evidence of hypermetabolic residual or recurrent disease. NOV 11th, 2023-PET scan shows complete resolution of the disease.   # Continue to hold Opdivo at this time given patient's recent episode of severe diarrhea-likely related to  Opdivo/immunotherapy.   # Autoimmune colitis/Opdivo induced diarrhea-s/p evaluation with GI; Dr.Wohl- improved- cutting down to 10 mg/day x2 weeks; and then 5 mg once day; do not stop until further directed. As per GI-consider treatment for E. coli if diarrhea does not improve after stopping steroids.  # Electrolyte abnormalities -hyponatremia 126-recommend Gatorade limiting free water.  Hypomagnesemia stable-monitor for now.  # Mild  Anemia- improved- Hb 11-12 sec to chemo-STABLE.  Continue gentle iron.  # PBF-BG- 183 Monitor for now [Gluerna protein shakes]- recommend decrease the prednisone as above.   #Hx of goitre-/ TSH low-however normal free T3-T4- .currently on surveillance s/p evaluation with  Dr. Forde Dandy, [facility] endocrinology. STABLE.   # Vaccinations: ok with Flu shot; and Covid-19.; ok with RSV  # IV access: PIV.   Repeat BP # DISPOSITION:  # follow up in 4 weeks; - MD; labs- cbc/cmp NO treatment; . - Dr.B

## 2022-05-10 NOTE — Progress Notes (Signed)
Patient states her lower back has been sore and she doesn't know why.

## 2022-05-10 NOTE — Progress Notes (Signed)
[facility] OFFICE PROGRESS NOTE  Patient Care Team: [name], MD as PCP - General (Internal Medicine) Clent Jacks, RN as Oncology Nurse Navigator Cammie Sickle, MD as Consulting Physician (Oncology) [name], MD as Consulting Physician (Gastroenterology) Borders, Kirt Boys, NP as Nurse Practitioner (Hospice and Palliative Medicine) Gloris Ham, RN as Registered Nurse (Oncology)   Cancer Staging  Neoplasm of middle third of esophagus Staging form: Esophagus - Squamous Cell Carcinoma, AJCC 8th Edition - Clinical: Stage Unknown (cTX, cN1, cM0) - Signed by Cammie Sickle, MD on 05/10/2021 - Pathologic: No stage assigned - Unsigned   Oncology History Overview Note  # 2015-patient is an 88 year old female with 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  Starting radiation and chemotherapy from April 14, 2014 Patient was started on carboplatinum andTaxol Herve were developed an allergic reaction to Taxol so would be changed to Abraxane 3.patient has finished 6 cycles of weekly chemotherapy with carboplatinum  and radiation therapy(May 28, 2014) 4.started on  NIVOLULAMAB because of persistent disease July 02, 2014. 5.  NIVOLULAMAB was discontinued because of persistent diarrhea in July of 2016.  August of 2016 CT scan was stable so no further chemotherapy  # AUG 25th PET- STABLE RUL MASS [radiation fibrosis; <1cm ? 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   APRIL 16th, 2023- PET scan-  Resolution of metabolic activity in the mid esophagus;  Persistent hypermetabolic  activity within a subcarinal lymph node. Differential includes residual carcinoma versus reactive adenopathy.  No evidence of distant metastatic disease.  # MAY 18th, 2023- s/p EUS [Dr.Spaete]-endoscopic biopsy of the mediastinal lymph node. MAY 18th, 2023- Status post endoscopic ultrasound/biopsy of the mediastinal/subcarinal lymph node- BIOPSY POSITIVE FOR RECURRENT SQUAMOUS CELL.  # JUNE 9th, 2023- Opdivo every 2 weeks.   # AAA/ 3.3x 3.9 Stable [Dr.Schnier] -----------------------------------------------------       History of lung cancer  Malignant neoplasm of right upper lobe of lung ([place])  08/07/2019 Initial Diagnosis   Malignant neoplasm of right upper lobe of lung ([place])    INTERVAL HISTORY: Ambulating with a rolling walker.  Accompanied by her son.  Heather Cooper over the phone.  Heather Cooper 87 y.o.  female [facility] patient  diagnosed squamous cell carcinoma of the midesophagus-with recurrent disease in the mediastinum  is here for a follow up/review results of the PET scan.  Patient's Opdivo is currently on HOLD given the concern for autoimmune colitis from [facility].    Patient currently on prednisone 20 mg/day for ongoing immunotherapy induced colitis.  Also met with Dr. Allen Norris.   Patient noted to have resolution of her diarrhea. Feels good.  Appetite is improved.  Weight stable.  She is more  up and about.   Review of Systems  Constitutional:  Positive for malaise/fatigue. Negative for chills, diaphoresis, fever and weight loss.  HENT:  Negative for nosebleeds and sore throat.   Eyes:  Negative for double vision.  Respiratory:  Negative for hemoptysis, sputum production, shortness of breath and wheezing.   Cardiovascular:  Negative for chest pain, palpitations, orthopnea and leg swelling.  Gastrointestinal:  Negative for abdominal pain, blood  in stool, constipation, diarrhea, heartburn, melena, nausea and vomiting.  Genitourinary:  Negative for dysuria, frequency and urgency.   Musculoskeletal:  Negative for back pain and joint pain.  Skin: Negative.  Negative for itching and rash.  Neurological:  Negative for dizziness, tingling, focal weakness, weakness and headaches.  Endo/Heme/Allergies:  Does not bruise/bleed easily.  Psychiatric/Behavioral:  Negative for depression. The patient is not nervous/anxious and does not have insomnia.      PAST MEDICAL HISTORY :  Past Medical History:  Diagnosis Date   Chemotherapy induced nausea and vomiting    Chicken pox    Esophageal cancer (HCC)    Hypercalcemia    familial hypocalciuric hypercalcemia   Hypercholesterolemia    Hypertension    Lung cancer ([place])    Osteoporosis    Thyroid disease     PAST SURGICAL HISTORY :   Past Surgical History:  Procedure Laterality Date   ABDOMINAL HYSTERECTOMY     partial   CHOLECYSTECTOMY     COLONOSCOPY WITH PROPOFOL N/A 04/17/2017   Procedure: COLONOSCOPY WITH PROPOFOL;  Surgeon: Manya Silvas, MD;  Location: [facility] ENDOSCOPY;  Service: Endoscopy;  Laterality: N/A;   ESOPHAGOGASTRODUODENOSCOPY (EGD) WITH PROPOFOL N/A 04/13/2021   Procedure: ESOPHAGOGASTRODUODENOSCOPY (EGD) WITH PROPOFOL;  Surgeon: [name], MD;  Location: [facility] ENDOSCOPY;  Service: Endoscopy;  Laterality: N/A;   EUS N/A 04/22/2021   Procedure: FULL UPPER ENDOSCOPIC ULTRASOUND (EUS) RADIAL;  Surgeon: Jola Schmidt, MD;  Location: [facility] ENDOSCOPY;  Service: Endoscopy;  Laterality: N/A;  [facility] needed   EUS N/A 10/28/2021   Procedure: UPPER ENDOSCOPIC ULTRASOUND (EUS) LINEAR;  Surgeon: Reita Cliche, MD;  Location: [facility] ENDOSCOPY;  Service: Gastroenterology;  Laterality: N/A;  [facility]   transvaginal hysterectomy  04/18/05   with anterior colporrhaphy    FAMILY HISTORY :   Family History  Problem Relation Age of Onset   Stroke Mother    Hypertension Mother    Prostate cancer Father    Cancer Father        prostate   Heart disease Brother        s/p CABG   Colon cancer Neg Hx     SOCIAL  HISTORY:   Social History   Tobacco Use   Smoking status: Former    Types: Cigarettes    Quit date: 06/13/1997    Years since quitting: 24.9   Smokeless tobacco: Never  Vaping Use   Vaping Use: Never used  Substance Use Topics   Alcohol use: No    Alcohol/week: 0.0 standard drinks of alcohol   Drug use: No    ALLERGIES:  is allergic to paclitaxel.  MEDICATIONS:  Current Outpatient Medications  Medication Sig Dispense Refill   albuterol (VENTOLIN HFA) 108 (90 Base) MCG/ACT inhaler Inhale 2 puffs into the lungs every 6 (six) hours as needed for wheezing or shortness of breath. 8 g 2   bismuth subsalicylate (PEPTO BISMOL) 262 MG/15ML suspension Take 30 mLs by mouth every 6 (six) hours as needed.     cholecalciferol (VITAMIN D3) 25 MCG (1000 UNIT) tablet Take 1,000 Units by mouth daily.     diphenoxylate-atropine (LOMOTIL) 2.5-0.025 MG tablet Take 1 tablet by mouth 4 (four) times daily as needed for diarrhea or loose stools. Take it along with immodium 60 tablet 1   fluticasone (FLONASE) 50 MCG/ACT nasal spray Place 2 sprays into both nostrils daily. 16 g 3   levocetirizine (XYZAL) 5 MG tablet Take 1 tablet (5 mg total) by mouth every  evening. 90 tablet 2   loperamide (IMODIUM) 2 MG capsule Take 2 mg by mouth as needed for diarrhea or loose stools.     Magnesium Cl-Calcium Carbonate (SLOW MAGNESIUM/CALCIUM) 70-117 MG TBEC Take 1 tablet by mouth 2 (two) times daily. 60 tablet 6   olmesartan-hydrochlorothiazide (BENICAR HCT) 20-12.5 MG tablet TAKE 1 TABLET BY MOUTH ONCE DAILY 90 tablet 1   pantoprazole (PROTONIX) 40 MG tablet TAKE 1 TABLET BY MOUTH ONCE DAILY 90 tablet 3   potassium chloride (KLOR-CON M) 10 MEQ tablet Take 1 tablet (10 mEq total) by mouth daily. 10 tablet 0   predniSONE (DELTASONE) 20 MG tablet Take 2 tablets (40 mg) x1 week, then take 1 tablet (20 mg) x1 week, then take half tablet (10 mg) x 1 week 30 tablet 0   No current facility-administered medications for this visit.    Facility-Administered Medications Ordered in Other Visits  Medication Dose Route Frequency Provider Last Rate Last Admin   0.9 %  sodium chloride infusion   Intravenous Continuous Borders, Kirt Boys, NP   Stopped at 01/21/22 1247   sodium chloride 0.9 % 1,000 mL with potassium chloride 20 mEq, magnesium sulfate 2 g infusion   Intravenous Continuous Forest Gleason, MD   Stopped at 12/04/14 1300    PHYSICAL EXAMINATION: ECOG PERFORMANCE STATUS: 0 - Asymptomatic  BP 138/82   Pulse (!) 106   Temp 98.7 F (37.1 C)   Resp 20   Wt 118 lb 11.2 oz (53.8 kg)   SpO2 100%   BMI 21.03 kg/m   Filed Weights   05/10/22 0949  Weight: 118 lb 11.2 oz (53.8 kg)       Physical Exam HENT:     Head: Normocephalic and atraumatic.     Mouth/Throat:     Pharynx: No oropharyngeal exudate.  Eyes:     Pupils: Pupils are equal, round, and reactive to light.  Cardiovascular:     Rate and Rhythm: Normal rate and regular rhythm.  Pulmonary:     Effort: Pulmonary effort is normal. No respiratory distress.     Breath sounds: Normal breath sounds. No wheezing.  Abdominal:     General: Bowel sounds are normal. There is no distension.     Palpations: Abdomen is soft. There is no mass.     Tenderness: There is no abdominal tenderness. There is no guarding or rebound.  Musculoskeletal:        General: No tenderness. Normal range of motion.     Cervical back: Normal range of motion and neck supple.  Skin:    General: Skin is warm.  Neurological:     Mental Status: She is alert and oriented to person, place, and time.  Psychiatric:        Mood and Affect: Affect normal.      LABORATORY DATA:  I have reviewed the data as listed    Component Value Date/Time   NA 128 (L) 04/26/2022 1448   NA 130 (L) 10/09/2014 0846   K 4.7 04/26/2022 1448   K 4.1 10/09/2014 0846   CL 91 (L) 04/26/2022 1448   CL 98 (L) 10/09/2014 0846   CO2 25 04/26/2022 1448   CO2 25 10/09/2014 0846   GLUCOSE 184 (H)  04/26/2022 1448   GLUCOSE 161 (H) 10/09/2014 0846   BUN 30 (H) 04/26/2022 1448   BUN 14 10/09/2014 0846   CREATININE 1.12 (H) 04/26/2022 1448   CREATININE 0.70 10/09/2014 0846   CALCIUM 9.9 04/26/2022 1448  CALCIUM 9.1 10/09/2014 0846   PROT 7.0 04/26/2022 1448   PROT 7.3 10/09/2014 0846   ALBUMIN 4.1 04/26/2022 1448   ALBUMIN 3.6 10/09/2014 0846   AST 17 04/26/2022 1448   AST 16 10/09/2014 0846   ALT 15 04/26/2022 1448   ALT 13 (L) 10/09/2014 0846   ALKPHOS 58 04/26/2022 1448   ALKPHOS 70 10/09/2014 0846   BILITOT 1.1 04/26/2022 1448   BILITOT 1.0 10/09/2014 0846   GFRNONAA 47 (L) 04/26/2022 1448   GFRNONAA >60 10/09/2014 0846   GFRAA >60 02/05/2020 1004   GFRAA >60 10/09/2014 0846    No results found for: "SPEP", "UPEP"  Lab Results  Component Value Date   WBC 11.3 (H) 04/26/2022   NEUTROABS 6.6 04/22/2022   HGB 12.2 04/26/2022   HCT 36.5 04/26/2022   MCV 90.3 04/26/2022   PLT 244 04/26/2022      Chemistry      Component Value Date/Time   NA 128 (L) 04/26/2022 1448   NA 130 (L) 10/09/2014 0846   K 4.7 04/26/2022 1448   K 4.1 10/09/2014 0846   CL 91 (L) 04/26/2022 1448   CL 98 (L) 10/09/2014 0846   CO2 25 04/26/2022 1448   CO2 25 10/09/2014 0846   BUN 30 (H) 04/26/2022 1448   BUN 14 10/09/2014 0846   CREATININE 1.12 (H) 04/26/2022 1448   CREATININE 0.70 10/09/2014 0846   GLU 111 03/18/2014 1048      Component Value Date/Time   CALCIUM 9.9 04/26/2022 1448   CALCIUM 9.1 10/09/2014 0846   ALKPHOS 58 04/26/2022 1448   ALKPHOS 70 10/09/2014 0846   AST 17 04/26/2022 1448   AST 16 10/09/2014 0846   ALT 15 04/26/2022 1448   ALT 13 (L) 10/09/2014 0846   BILITOT 1.1 04/26/2022 1448   BILITOT 1.0 10/09/2014 0846       RADIOGRAPHIC STUDIES: I have personally reviewed the radiological images as listed and agreed with the findings in the report. No results found.    ASSESSMENT & PLAN:  Neoplasm of middle third of esophagus # Squamous cell carcinoma of  the mid-esophagus [NOV 2022]-  locally advanced; RECURRENT  MAY 18th, 2023-most recently status post Opdivoq 2 W x4 cycles-January 26 2022-PET scan: Resolution of the previously described hypermetabolic subcarinal node. No evidence of hypermetabolic residual or recurrent disease. NOV 11th, 2023-PET scan shows complete resolution of the disease.   # Continue to hold Opdivo at this time given patient's recent episode of severe diarrhea-likely related to  Opdivo/immunotherapy.   # Autoimmune colitis/Opdivo induced diarrhea-s/p evaluation with GI; Dr.Wohl- improved- cutting down to 10 mg/day x2 weeks; and then 5 mg once day; do not stop until further directed. As per GI-consider treatment for E. coli if diarrhea does not improve after stopping steroids.  # Electrolyte abnormalities -hyponatremia 126-recommend Gatorade limiting free water.  Hypomagnesemia stable-monitor for now.  # Mild  Anemia- improved- Hb 11-12 sec to chemo-STABLE.  Continue gentle iron.  # PBF-BG- 183 Monitor for now [Gluerna protein shakes]- recommend decrease the prednisone as above.   #Hx of goitre-/ TSH low-however normal free T3-T4- .currently on surveillance s/p evaluation with  Dr. Forde Dandy, [facility] end[facility]. STABLE.   # Vaccinations: ok with Flu shot; and Covid-19.; ok with RSV  # IV access: PIV.   Repeat BP # DISPOSITION:  # follow up in 4 weeks; - MD; labs- cbc/cmp NO treatment; . - Dr.B          No orders of the  defined types were placed in this encounter.   All questions were answered. The patient knows to call the clinic with any problems, questions or concerns.      Cammie Sickle, MD 05/10/2022 12:00 PM

## 2022-05-13 ENCOUNTER — Other Ambulatory Visit: Payer: Self-pay

## 2022-05-13 ENCOUNTER — Encounter: Payer: Self-pay | Admitting: Internal Medicine

## 2022-05-13 ENCOUNTER — Other Ambulatory Visit: Payer: Self-pay | Admitting: *Deleted

## 2022-05-13 NOTE — Telephone Encounter (Signed)
Will run out of prednisone soon, not enough to last for this treatment

## 2022-05-18 ENCOUNTER — Other Ambulatory Visit (INDEPENDENT_AMBULATORY_CARE_PROVIDER_SITE_OTHER): Payer: Self-pay | Admitting: Vascular Surgery

## 2022-05-18 DIAGNOSIS — I7143 Infrarenal abdominal aortic aneurysm, without rupture: Secondary | ICD-10-CM

## 2022-05-18 NOTE — Progress Notes (Signed)
MRN : [id number]  Heather Cooper is a 88 y.o. ([birth year]) female who presents with chief complaint of check circulation.  History of Present Illness:   The patient returns to the office for surveillance of a known abdominal aortic aneurysm. Patient denies abdominal pain or back pain, no other abdominal complaints. No changes suggesting embolic episodes.    There have been no interval changes in the patient's overall health care since his last visit.   Patient denies amaurosis fugax or TIA symptoms. There is no history of claudication or rest pain symptoms of the lower extremities. The patient denies angina or shortness of breath.    Duplex US of the aorta and iliac arteries shows an AAA measured 4.7 cm.  No significant change compared to the studies 2+ years ago which measured her AAA at 4.68 cm.  I suspect that last years study was spurious.  No outpatient medications have been marked as taking for the 05/19/22 encounter (Appointment) with Delana Meyer, Dolores Lory, MD.    Past Medical History:  Diagnosis Date   Chemotherapy induced nausea and vomiting    Chicken pox    Esophageal cancer ([place])    Hypercalcemia    familial hypocalciuric hypercalcemia   Hypercholesterolemia    Hypertension    Lung cancer ([place])    Osteoporosis    Thyroid disease     Past Surgical History:  Procedure Laterality Date   ABDOMINAL HYSTERECTOMY     partial   CHOLECYSTECTOMY     COLONOSCOPY WITH PROPOFOL N/A 04/17/2017   Procedure: COLONOSCOPY WITH PROPOFOL;  Surgeon: Manya Silvas, MD;  Location: [facility] ENDOSCOPY;  Service: Endoscopy;  Laterality: N/A;   ESOPHAGOGASTRODUODENOSCOPY (EGD) WITH PROPOFOL N/A 04/13/2021   Procedure: ESOPHAGOGASTRODUODENOSCOPY (EGD) WITH PROPOFOL;  Surgeon: [name], MD;  Location: [facility] ENDOSCOPY;  Service: Endoscopy;  Laterality: N/A;   EUS N/A 04/22/2021   Procedure: FULL UPPER ENDOSCOPIC ULTRASOUND (EUS) RADIAL;  Surgeon: Jola Schmidt, MD;   Location: [facility] ENDOSCOPY;  Service: Endoscopy;  Laterality: N/A;  [facility] needed   EUS N/A 10/28/2021   Procedure: UPPER ENDOSCOPIC ULTRASOUND (EUS) LINEAR;  Surgeon: Reita Cliche, MD;  Location: [facility] ENDOSCOPY;  Service: Gastroenterology;  Laterality: N/A;  [facility]   transvaginal hysterectomy  04/18/05   with anterior colporrhaphy    Social History Social History   Tobacco Use   Smoking status: Former    Types: Cigarettes    Quit date: 06/13/1997    Years since quitting: 24.9   Smokeless tobacco: Never  Vaping Use   Vaping Use: Never used  Substance Use Topics   Alcohol use: No    Alcohol/week: 0.0 standard drinks of alcohol   Drug use: No    Family History Family History  Problem Relation Age of Onset   Stroke Mother    Hypertension Mother    Prostate cancer Father    Cancer Father        prostate   Heart disease Brother        s/p CABG   Colon cancer Neg Hx     Allergies  Allergen Reactions   Paclitaxel Other (See Comments)    Chest tightness     REVIEW OF SYSTEMS (Negative unless checked)  Constitutional: [] Weight loss  [] Fever  [] Chills Cardiac: [] Chest pain   [] Chest pressure   [] Palpitations   [] Shortness of breath when laying flat   [] Shortness of breath with exertion.  Vascular:  [x] Pain in legs with walking   [] Pain in legs at rest  [] History of DVT   [] Phlebitis   [] Swelling in legs   [] Varicose veins   [] Non-healing ulcers Pulmonary:   [] Uses home oxygen   [] Productive cough   [] Hemoptysis   [] Wheeze  [] COPD   [] Asthma Neurologic:  [] Dizziness   [] Seizures   [] History of stroke   [] History of TIA  [] Aphasia   [] Vissual changes   [] Weakness or numbness in arm   [] Weakness or numbness in leg Musculoskeletal:   [] Joint swelling   [] Joint pain   [] Low back pain Hematologic:  [] Easy bruising  [] Easy bleeding   [] Hypercoagulable state   [] Anemic Gastrointestinal:  [] Diarrhea   [] Vomiting  [x] Gastroesophageal reflux/heartburn   [] Difficulty  swallowing. Genitourinary:  [] Chronic kidney disease   [] Difficult urination  [] Frequent urination   [] Blood in urine Skin:  [] Rashes   [] Ulcers  Psychological:  [] History of anxiety   []  History of major depression.  Physical Examination  There were no vitals filed for this visit. There is no height or weight on file to calculate BMI. Gen: WD/WN, NAD Head: /AT, No temporalis wasting.  Ear/Nose/Throat: Hearing grossly intact, nares w/o erythema or drainage Eyes: PER, EOMI, sclera nonicteric.  Neck: Supple, no masses.  No bruit or JVD.  Pulmonary:  Good air movement, no audible wheezing, no use of accessory muscles.  Cardiac: RRR, normal S1, S2, no Murmurs. Vascular:  mild trophic changes, no open wounds Vessel Right Left  Radial Palpable Palpable  PT Not Palpable Not Palpable  DP Not Palpable Not Palpable  Gastrointestinal: soft, non-distended. No guarding/no peritoneal signs.  Musculoskeletal: M/S 5/5 throughout.  No visible deformity.  Neurologic: CN 2-12 intact. Pain and light touch intact in extremities.  Symmetrical.  Speech is fluent. Motor exam as listed above. Psychiatric: Judgment intact, Mood & affect appropriate for pt's clinical situation. Dermatologic: No rashes or ulcers noted.  No changes consistent with cellulitis.   CBC Lab Results  Component Value Date   WBC 11.3 (H) 04/26/2022   HGB 12.2 04/26/2022   HCT 36.5 04/26/2022   MCV 90.3 04/26/2022   PLT 244 04/26/2022    BMET    Component Value Date/Time   NA 128 (L) 04/26/2022 1448   NA 130 (L) 10/09/2014 0846   K 4.7 04/26/2022 1448   K 4.1 10/09/2014 0846   CL 91 (L) 04/26/2022 1448   CL 98 (L) 10/09/2014 0846   CO2 25 04/26/2022 1448   CO2 25 10/09/2014 0846   GLUCOSE 184 (H) 04/26/2022 1448   GLUCOSE 161 (H) 10/09/2014 0846   BUN 30 (H) 04/26/2022 1448   BUN 14 10/09/2014 0846   CREATININE 1.12 (H) 04/26/2022 1448   CREATININE 0.70 10/09/2014 0846   CALCIUM 9.9 04/26/2022 1448   CALCIUM 9.1  10/09/2014 0846   GFRNONAA 47 (L) 04/26/2022 1448   GFRNONAA >60 10/09/2014 0846   GFRAA >60 02/05/2020 1004   GFRAA >60 10/09/2014 0846   CrCl cannot be calculated (Patient's most recent lab result is older than the maximum 21 days allowed.).  COAG Lab Results  Component Value Date   INR 1.0 03/18/2014   INR 1.0 03/18/2014   PROTIME 13.2 03/18/2014    Radiology No results found.   Assessment/Plan 1. Abdominal aortic aneurysm (AAA) without rupture, unspecified part ([place]) Recommend: No surgery or intervention is indicated at this time.  The patient has an asymptomatic abdominal aortic aneurysm that is greater than 4 cm but  less than 5 cm in maximal diameter.    I have reviewed the natural history of abdominal aortic aneurysm and the small risk of rupture for aneurysm less than 5 cm in size.  However, as these small aneurysms tend to enlarge over time, continued surveillance with ultrasound or CT scan is mandatory.   I have also discussed optimizing medical management with hypertension and lipid control and the importance of abstinence from tobacco.  The patient is also encouraged to exercise a minimum of 30 minutes 4 times a week.   Should the patient develop new onset abdominal or back pain or signs of peripheral embolization they are instructed to seek medical attention immediately and to alert the physician providing care that they have an aneurysm.   The patient voices their understanding.  Given the long term stability of her AAA I have scheduled the patient to return in 12 months with an aortic duplex.  - VAS US AORTA/IVC/ILIACS; Future  2. PAD (peripheral artery disease) (HCC)  Recommend:  The patient has evidence of atherosclerosis of the lower extremities with claudication.  The patient does not voice lifestyle limiting changes at this point in time.  Noninvasive studies do not suggest clinically significant change.  No invasive studies, angiography or surgery at  this time The patient should continue walking and begin a more formal exercise program.  The patient should continue antiplatelet therapy and aggressive treatment of the lipid abnormalities  No changes in the patient's medications at this time  Continued surveillance is indicated as atherosclerosis is likely to progress with time.    The patient will continue follow up with noninvasive studies as ordered.   3. Pulmonary emphysema, unspecified emphysema type ([place]) Continue pulmonary medications and aerosols as already ordered, these medications have been reviewed and there are no changes at this time.   4. Type 2 diabetes mellitus with hyperglycemia, without long-term current use of insulin (HCC) Continue hypoglycemic medications as already ordered, these medications have been reviewed and there are no changes at this time.  Hgb A1C to be monitored as already arranged by primary service  5. Hypercholesterolemia Continue statin as ordered and reviewed, no changes at this time  6. Infrarenal abdominal aortic aneurysm (AAA) without rupture ([place]) See #1 - VAS [place] AAA DUPLEX    Hortencia Pilar, MD  05/18/2022 9:13 AM

## 2022-05-19 ENCOUNTER — Ambulatory Visit (INDEPENDENT_AMBULATORY_CARE_PROVIDER_SITE_OTHER): Payer: Medicare PPO | Admitting: Vascular Surgery

## 2022-05-19 ENCOUNTER — Telehealth: Payer: Self-pay

## 2022-05-19 ENCOUNTER — Other Ambulatory Visit: Payer: Self-pay

## 2022-05-19 ENCOUNTER — Ambulatory Visit (INDEPENDENT_AMBULATORY_CARE_PROVIDER_SITE_OTHER): Payer: Medicare PPO

## 2022-05-19 ENCOUNTER — Encounter (INDEPENDENT_AMBULATORY_CARE_PROVIDER_SITE_OTHER): Payer: Self-pay | Admitting: Vascular Surgery

## 2022-05-19 VITALS — BP 129/71 | HR 110 | Resp 18 | Ht 63.0 in | Wt 118.0 lb

## 2022-05-19 DIAGNOSIS — E7800 Pure hypercholesterolemia, unspecified: Secondary | ICD-10-CM

## 2022-05-19 DIAGNOSIS — I739 Peripheral vascular disease, unspecified: Secondary | ICD-10-CM | POA: Diagnosis not present

## 2022-05-19 DIAGNOSIS — J439 Emphysema, unspecified: Secondary | ICD-10-CM

## 2022-05-19 DIAGNOSIS — I714 Abdominal aortic aneurysm, without rupture, unspecified: Secondary | ICD-10-CM

## 2022-05-19 DIAGNOSIS — E1165 Type 2 diabetes mellitus with hyperglycemia: Secondary | ICD-10-CM

## 2022-05-19 DIAGNOSIS — R197 Diarrhea, unspecified: Secondary | ICD-10-CM

## 2022-05-19 DIAGNOSIS — I7143 Infrarenal abdominal aortic aneurysm, without rupture: Secondary | ICD-10-CM | POA: Diagnosis not present

## 2022-05-19 MED ORDER — PREDNISONE 5 MG PO TABS: 5.0000 mg | ORAL_TABLET | Freq: Every day | ORAL | 1 refills | Status: DC

## 2022-05-19 NOTE — Addendum Note (Signed)
Addended by: Gloris Ham on: 05/19/2022 02:01 PM   Modules accepted: Orders

## 2022-05-19 NOTE — Telephone Encounter (Signed)
Spoke with Josh and Dr. Rogue Bussing. Ok to RF prednisone 5 mg daily. #30 and 1RF. Dr. B will discuss future refills beyond this fill for prednisone.

## 2022-05-19 NOTE — Telephone Encounter (Signed)
Daughter came in saying she was waiting on 2 weeks supply prednisone 5mg  needed to be sent to [facility] in [place] for her mother. Confirmed with Dr. Jacinto Reap & Josh ok to refill sent to pharmacy as requested.

## 2022-05-22 ENCOUNTER — Encounter (INDEPENDENT_AMBULATORY_CARE_PROVIDER_SITE_OTHER): Payer: Self-pay | Admitting: Vascular Surgery

## 2022-05-24 ENCOUNTER — Other Ambulatory Visit: Payer: Self-pay | Admitting: Internal Medicine

## 2022-06-02 ENCOUNTER — Ambulatory Visit: Payer: Medicare PPO | Admitting: Radiation Oncology

## 2022-06-03 ENCOUNTER — Other Ambulatory Visit: Payer: Self-pay | Admitting: *Deleted

## 2022-06-03 DIAGNOSIS — D490 Neoplasm of unspecified behavior of digestive system: Secondary | ICD-10-CM

## 2022-06-07 ENCOUNTER — Encounter: Payer: Self-pay | Admitting: Internal Medicine

## 2022-06-07 ENCOUNTER — Inpatient Hospital Stay (HOSPITAL_BASED_OUTPATIENT_CLINIC_OR_DEPARTMENT_OTHER): Payer: Medicare PPO | Admitting: Internal Medicine

## 2022-06-07 ENCOUNTER — Inpatient Hospital Stay: Payer: Medicare PPO | Attending: Internal Medicine

## 2022-06-07 VITALS — BP 143/75 | HR 100 | Temp 97.1°F | Resp 16 | Ht 63.0 in | Wt 117.6 lb

## 2022-06-07 DIAGNOSIS — T451X5A Adverse effect of antineoplastic and immunosuppressive drugs, initial encounter: Secondary | ICD-10-CM | POA: Insufficient documentation

## 2022-06-07 DIAGNOSIS — C781 Secondary malignant neoplasm of mediastinum: Secondary | ICD-10-CM | POA: Diagnosis not present

## 2022-06-07 DIAGNOSIS — E860 Dehydration: Secondary | ICD-10-CM | POA: Diagnosis present

## 2022-06-07 DIAGNOSIS — C154 Malignant neoplasm of middle third of esophagus: Secondary | ICD-10-CM | POA: Diagnosis not present

## 2022-06-07 DIAGNOSIS — D490 Neoplasm of unspecified behavior of digestive system: Secondary | ICD-10-CM | POA: Diagnosis not present

## 2022-06-07 DIAGNOSIS — T380X5A Adverse effect of glucocorticoids and synthetic analogues, initial encounter: Secondary | ICD-10-CM | POA: Diagnosis not present

## 2022-06-07 DIAGNOSIS — R197 Diarrhea, unspecified: Secondary | ICD-10-CM | POA: Diagnosis not present

## 2022-06-07 DIAGNOSIS — K521 Toxic gastroenteritis and colitis: Secondary | ICD-10-CM | POA: Insufficient documentation

## 2022-06-07 DIAGNOSIS — Z87891 Personal history of nicotine dependence: Secondary | ICD-10-CM | POA: Diagnosis not present

## 2022-06-07 DIAGNOSIS — D649 Anemia, unspecified: Secondary | ICD-10-CM | POA: Diagnosis not present

## 2022-06-07 DIAGNOSIS — Z7952 Long term (current) use of systemic steroids: Secondary | ICD-10-CM | POA: Diagnosis not present

## 2022-06-07 DIAGNOSIS — G720 Drug-induced myopathy: Secondary | ICD-10-CM | POA: Insufficient documentation

## 2022-06-07 LAB — CBC WITH DIFFERENTIAL/PLATELET
Abs Immature Granulocytes: 0.06 10*3/uL (ref 0.00–0.07)
Basophils Absolute: 0.1 10*3/uL (ref 0.0–0.1)
Basophils Relative: 1 %
Eosinophils Absolute: 0.2 10*3/uL (ref 0.0–0.5)
Eosinophils Relative: 2 %
HCT: 29.8 % — ABNORMAL LOW (ref 36.0–46.0)
Hemoglobin: 10.1 g/dL — ABNORMAL LOW (ref 12.0–15.0)
Immature Granulocytes: 1 %
Lymphocytes Relative: 15 %
Lymphs Abs: 1.3 10*3/uL (ref 0.7–4.0)
MCH: 29.6 pg (ref 26.0–34.0)
MCHC: 33.9 g/dL (ref 30.0–36.0)
MCV: 87.4 fL (ref 80.0–100.0)
Monocytes Absolute: 1 10*3/uL (ref 0.1–1.0)
Monocytes Relative: 11 %
Neutro Abs: 6.6 10*3/uL (ref 1.7–7.7)
Neutrophils Relative %: 70 %
Platelets: 385 10*3/uL (ref 150–400)
RBC: 3.41 MIL/uL — ABNORMAL LOW (ref 3.87–5.11)
RDW: 13.5 % (ref 11.5–15.5)
WBC: 9.2 10*3/uL (ref 4.0–10.5)
nRBC: 0 % (ref 0.0–0.2)

## 2022-06-07 LAB — COMPREHENSIVE METABOLIC PANEL
ALT: 15 U/L (ref 0–44)
AST: 17 U/L (ref 15–41)
Albumin: 3.4 g/dL — ABNORMAL LOW (ref 3.5–5.0)
Alkaline Phosphatase: 67 U/L (ref 38–126)
Anion gap: 9 (ref 5–15)
BUN: 19 mg/dL (ref 8–23)
CO2: 27 mmol/L (ref 22–32)
Calcium: 10.3 mg/dL (ref 8.9–10.3)
Chloride: 103 mmol/L (ref 98–111)
Creatinine, Ser: 0.8 mg/dL (ref 0.44–1.00)
GFR, Estimated: >60 mL/min (ref >60)
Glucose, Bld: 134 mg/dL — ABNORMAL HIGH (ref 70–99)
Potassium: 4 mmol/L (ref 3.5–5.1)
Sodium: 139 mmol/L (ref 135–145)
Total Bilirubin: 0.3 mg/dL (ref 0.3–1.2)
Total Protein: 7.1 g/dL (ref 6.5–8.1)

## 2022-06-07 MED ORDER — PREDNISONE 2.5 MG PO TABS: ORAL_TABLET | ORAL | 0 refills | Status: DC

## 2022-06-07 NOTE — Assessment & Plan Note (Addendum)
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 No evidence of hypermetabolic residual or recurrent disease. NOV 11th, 2023-PET scan shows complete resolution of the disease.   # Continue to hold Opdivo at this time given patient's recent episode of severe diarrhea-likely related to  Opdivo/immunotherapy.   # Autoimmune colitis/Opdivo induced diarrhea-s/p evaluation with GI; Dr.Wohl- improved- cutting down to 2.5 mg/day x2 weeks; and then 2.5 mg once every other day; do not stop until further directed.   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   # Mild  Anemia- improved- Hb 10.8  sec to chemo-STABLE.  Recommends restarting gentle iron.  Will check iron studies at next visit.  # PBF-BG- 143-  Monitor for now [Gluerna protein shakes]- continue taper the prednisone as above.   #Hx of goitre-/ TSH low-however normal free T3-T4- .currently on surveillance s/p evaluation with  Dr. Forde Dandy, [facility] endocrinology. STABLE.   # Vaccinations: ok with Flu shot; and Covid-19.; ok with RSV  # IV access: PIV.   Repeat BP # DISPOSITION:  # referral Physical therapy re: steroid myopathy # follow up in 4 weeks; - MD; labs- cbc/cmp; iron studies; ferritin; NO treatment; . - Dr.B

## 2022-06-07 NOTE — Progress Notes (Signed)
Patient denies new problems/concerns today.    Taking Prednisone 5mg  for 2 weeks.

## 2022-06-07 NOTE — Progress Notes (Signed)
[facility] OFF[facility] PROGRESS NOTE  Patient Care Team: [name], MD as PCP - General (Internal Medicine) Clent Jacks, RN as Oncology Nurse Navigator Cammie Sickle, MD as Consulting Physician (Oncology) [name], MD as Consulting Physician (Gastroenterology) Borders, Kirt Boys, NP as Nurse Practitioner (Hospice and Palliative Medicine) Gloris Ham, RN as Registered Nurse (Oncology)   Cancer Staging  Neoplasm of middle third of esophagus Staging form: Esophagus - Squamous Cell Carcinoma, AJCC 8th Edition - Clinical: Stage Unknown (cTX, cN1, cM0) - Signed by Cammie Sickle, MD on 05/10/2021 - Pathologic: No stage assigned - Unsigned   Oncology History Overview Note  # 2015-patient is an 88 year old female with 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  Starting radiation and chemotherapy from April 14, 2014 Patient was started on carboplatinum andTaxol Herve were developed an allergic reaction to Taxol so would be changed to Abraxane 3.patient has finished 6 cycles of weekly chemotherapy with carboplatinum  and radiation therapy(May 28, 2014) 4.started on  NIVOLULAMAB because of persistent disease July 02, 2014. 5.  NIVOLULAMAB was discontinued because of persistent diarrhea in July of 2016.  August of 2016 CT scan was stable so no further chemotherapy  # AUG 25th PET- STABLE RUL MASS [radiation fibrosis; <1cm ? 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   APRIL 16th, 2023- PET scan-  Resolution of metabolic activity in the mid esophagus;  Persistent hypermetabolic  activity within a subcarinal lymph node. Differential includes residual carcinoma versus reactive adenopathy.  No evidence of distant metastatic disease.  # MAY 18th, 2023- s/p EUS [Dr.Spaete]-endoscopic biopsy of the mediastinal lymph node. MAY 18th, 2023- Status post endoscopic ultrasound/biopsy of the mediastinal/subcarinal lymph node- BIOPSY POSITIVE FOR RECURRENT SQUAMOUS CELL.  # JUNE 9th, 2023- Opdivo every 2 weeks.   # AAA/ 3.3x 3.9 Stable [Dr.Schnier] -----------------------------------------------------       History of lung cancer  Malignant neoplasm of right upper lobe of lung ([place])  08/07/2019 Initial Diagnosis   Malignant neoplasm of right upper lobe of lung ([place])    INTERVAL HISTORY: Ambulating with a rolling walker.  Accompanied by [name].   Heather Cooper 88 y.o.  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 Patient's Opdivo is currently on HOLD given the concern for autoimmune colitis from [facility].    Patient currently on prednisone 5 mg/day for the last 2 weeks for ongoing immunotherapy induced colitis.    Patient noted to have resolution of her diarrhea. Feels good.  Appetite is improved.  Weight stable.  She is more  up and about.  However she feels weak in the legs.   Review of Systems  Constitutional:  Positive for malaise/fatigue. Negative for chills, diaphoresis, fever and weight loss.  HENT:  Negative for nosebleeds and sore throat.   Eyes:  Negative for double vision.  Respiratory:  Negative for hemoptysis, sputum production, shortness of breath and wheezing.   Cardiovascular:  Negative for chest pain, palpitations, orthopnea and leg swelling.  Gastrointestinal:  Negative for abdominal pain, blood in stool, constipation,  diarrhea, heartburn, melena, nausea and vomiting.  Genitourinary:  Negative for dysuria, frequency and urgency.  Musculoskeletal:   Negative for back pain and joint pain.  Skin: Negative.  Negative for itching and rash.  Neurological:  Negative for dizziness, tingling, focal weakness, weakness and headaches.  Endo/Heme/Allergies:  Does not bruise/bleed easily.  Psychiatric/Behavioral:  Negative for depression. The patient is not nervous/anxious and does not have insomnia.    PAST MEDICAL HISTORY :  Past Medical History:  Diagnosis Date   Chemotherapy induced nausea and vomiting    Chicken pox    Esophageal cancer (HCC)    Hypercalcemia    familial hypocalciuric hypercalcemia   Hypercholesterolemia    Hypertension    Lung cancer ([place])    Osteoporosis    Thyroid disease     PAST SURGICAL HISTORY :   Past Surgical History:  Procedure Laterality Date   ABDOMINAL HYSTERECTOMY     partial   CHOLECYSTECTOMY     COLONOSCOPY WITH PROPOFOL N/A 04/17/2017   Procedure: COLONOSCOPY WITH PROPOFOL;  Surgeon: Manya Silvas, MD;  Location: [facility] ENDOSCOPY;  Service: Endoscopy;  Laterality: N/A;   ESOPHAGOGASTRODUODENOSCOPY (EGD) WITH PROPOFOL N/A 04/13/2021   Procedure: ESOPHAGOGASTRODUODENOSCOPY (EGD) WITH PROPOFOL;  Surgeon: [name], MD;  Location: [facility] ENDOSCOPY;  Service: Endoscopy;  Laterality: N/A;   EUS N/A 04/22/2021   Procedure: FULL UPPER ENDOSCOPIC ULTRASOUND (EUS) RADIAL;  Surgeon: Jola Schmidt, MD;  Location: [facility] ENDOSCOPY;  Service: Endoscopy;  Laterality: N/A;  [facility] needed   EUS N/A 10/28/2021   Procedure: UPPER ENDOSCOPIC ULTRASOUND (EUS) LINEAR;  Surgeon: Reita Cliche, MD;  Location: [facility] ENDOSCOPY;  Service: Gastroenterology;  Laterality: N/A;  [facility]   transvaginal hysterectomy  04/18/05   with anterior colporrhaphy    FAMILY HISTORY :   Family History  Problem Relation Age of Onset   Stroke Mother    Hypertension Mother    Prostate cancer Father    Cancer Father        prostate   Heart disease Brother        s/p CABG   Colon cancer Neg Hx     SOCIAL HISTORY:   Social History    Tobacco Use   Smoking status: Former    Types: Cigarettes    Quit date: 06/13/1997    Years since quitting: 25.0   Smokeless tobacco: Never  Vaping Use   Vaping Use: Never used  Substance Use Topics   Alcohol use: No    Alcohol/week: 0.0 standard drinks of alcohol   Drug use: No    ALLERGIES:  is allergic to paclitaxel.  MEDICATIONS:  Current Outpatient Medications  Medication Sig Dispense Refill   bismuth subsalicylate (PEPTO BISMOL) 262 MG/15ML suspension Take 30 mLs by mouth every 6 (six) hours as needed.     cholecalciferol (VITAMIN D3) 25 MCG (1000 UNIT) tablet Take 1,000 Units by mouth daily.     diphenoxylate-atropine (LOMOTIL) 2.5-0.025 MG tablet Take 1 tablet by mouth 4 (four) times daily as needed for diarrhea or loose stools. Take it along with immodium 60 tablet 1   fluticasone (FLONASE) 50 MCG/ACT nasal spray Place 2 sprays into both nostrils daily. 16 g 3   levocetirizine (XYZAL) 5 MG tablet Take 1 tablet (5 mg total) by mouth every evening. 90 tablet 2   loperamide (IMODIUM) 2 MG capsule Take 2 mg by mouth as needed for diarrhea or loose stools.     olmesartan-hydrochlorothiazide (BENICAR HCT) 20-12.5 MG tablet TAKE 1 TABLET  BY MOUTH ONCE DAILY 90 tablet 1   pantoprazole (PROTONIX) 40 MG tablet TAKE 1 TABLET BY MOUTH ONCE DAILY 90 tablet 3   albuterol (VENTOLIN HFA) 108 (90 Base) MCG/ACT inhaler Inhale 2 puffs into the lungs every 6 (six) hours as needed for wheezing or shortness of breath. (Patient not taking: Reported on 05/19/2022) 8 g 2   Magnesium Cl-Calcium Carbonate (SLOW MAGNESIUM/CALCIUM) 70-117 MG TBEC Take 1 tablet by mouth 2 (two) times daily. (Patient not taking: Reported on 05/19/2022) 60 tablet 6   potassium chloride (KLOR-CON M) 10 MEQ tablet Take 1 tablet (10 mEq total) by mouth daily. (Patient not taking: Reported on 05/19/2022) 10 tablet 0   predniSONE (DELTASONE) 2.5 MG tablet Take 1 pill a day for 2 weeks; and then 1 pill every other day-do not stop  until directed. 30 tablet 0   No current facility-administered medications for this visit.   Facility-Administered Medications Ordered in Other Visits  Medication Dose Route Frequency Provider Last Rate Last Admin   0.9 %  sodium chloride infusion   Intravenous Continuous Borders, Kirt Boys, NP   Stopped at 01/21/22 1247   sodium chloride 0.9 % 1,000 mL with potassium chloride 20 mEq, magnesium sulfate 2 g infusion   Intravenous Continuous Forest Gleason, MD   Stopped at 12/04/14 1300    PHYSICAL EXAMINATION: ECOG PERFORMANCE STATUS: 0 - Asymptomatic  BP (!) 143/75 (BP Location: Left Arm, Patient Position: Sitting)   Pulse 100   Temp (!) 97.1 F (36.2 C) (Tympanic)   Resp 16   Ht _0  (1.6 m)   Wt 117 lb 9.6 oz (53.3 kg)   SpO2 99%   BMI 20.83 kg/m   Filed Weights   06/07/22 0900  Weight: 117 lb 9.6 oz (53.3 kg)       Physical Exam HENT:     Head: Normocephalic and atraumatic.     Mouth/Throat:     Pharynx: No oropharyngeal exudate.  Eyes:     Pupils: Pupils are equal, round, and reactive to light.  Cardiovascular:     Rate and Rhythm: Normal rate and regular rhythm.  Pulmonary:     Effort: Pulmonary effort is normal. No respiratory distress.     Breath sounds: Normal breath sounds. No wheezing.  Abdominal:     General: Bowel sounds are normal. There is no distension.     Palpations: Abdomen is soft. There is no mass.     Tenderness: There is no abdominal tenderness. There is no guarding or rebound.  Musculoskeletal:        General: No tenderness. Normal range of motion.     Cervical back: Normal range of motion and neck supple.  Skin:    General: Skin is warm.  Neurological:     Mental Status: She is alert and oriented to person, place, and time.  Psychiatric:        Mood and Affect: Affect normal.      LABORATORY DATA:  I have reviewed the data as listed    Component Value Date/Time   NA 139 06/07/2022 0921   NA 130 (L) 10/09/2014 0846   K 4.0  06/07/2022 0921   K 4.1 10/09/2014 0846   CL 103 06/07/2022 0921   CL 98 (L) 10/09/2014 0846   CO2 27 06/07/2022 0921   CO2 25 10/09/2014 0846   GLUCOSE 134 (H) 06/07/2022 0921   GLUCOSE 161 (H) 10/09/2014 0846   BUN 19 06/07/2022 0921   BUN 14 10/09/2014  0846   CREATININE 0.80 06/07/2022 0921   CREATININE 0.70 10/09/2014 0846   CALCIUM 10.3 06/07/2022 0921   CALCIUM 9.1 10/09/2014 0846   PROT 7.1 06/07/2022 0921   PROT 7.3 10/09/2014 0846   ALBUMIN 3.4 (L) 06/07/2022 0921   ALBUMIN 3.6 10/09/2014 0846   AST 17 06/07/2022 0921   AST 16 10/09/2014 0846   ALT 15 06/07/2022 0921   ALT 13 (L) 10/09/2014 0846   ALKPHOS 67 06/07/2022 0921   ALKPHOS 70 10/09/2014 0846   BILITOT 0.3 06/07/2022 0921   BILITOT 1.0 10/09/2014 0846   GFRNONAA >60 06/07/2022 0921   GFRNONAA >60 10/09/2014 0846   GFRAA >60 02/05/2020 1004   GFRAA >60 10/09/2014 0846    No results found for: "SPEP", "UPEP"  Lab Results  Component Value Date   WBC 9.2 06/07/2022   NEUTROABS 6.6 06/07/2022   HGB 10.1 (L) 06/07/2022   HCT 29.8 (L) 06/07/2022   MCV 87.4 06/07/2022   PLT 385 06/07/2022      Chemistry      Component Value Date/Time   NA 139 06/07/2022 0921   NA 130 (L) 10/09/2014 0846   K 4.0 06/07/2022 0921   K 4.1 10/09/2014 0846   CL 103 06/07/2022 0921   CL 98 (L) 10/09/2014 0846   CO2 27 06/07/2022 0921   CO2 25 10/09/2014 0846   BUN 19 06/07/2022 0921   BUN 14 10/09/2014 0846   CREATININE 0.80 06/07/2022 0921   CREATININE 0.70 10/09/2014 0846   GLU 111 03/18/2014 1048      Component Value Date/Time   CALCIUM 10.3 06/07/2022 0921   CALCIUM 9.1 10/09/2014 0846   ALKPHOS 67 06/07/2022 0921   ALKPHOS 70 10/09/2014 0846   AST 17 06/07/2022 0921   AST 16 10/09/2014 0846   ALT 15 06/07/2022 0921   ALT 13 (L) 10/09/2014 0846   BILITOT 0.3 06/07/2022 0921   BILITOT 1.0 10/09/2014 0846       RADIOGRAPHIC STUDIES: I have personally reviewed the radiological images as listed and  agreed with the findings in the report. No results found.    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 No evidence of hypermetabolic residual or recurrent disease. NOV 11th, 2023-PET scan shows complete resolution of the disease.   # Continue to hold Opdivo at this time given patient's recent episode of severe diarrhea-likely related to  Opdivo/immunotherapy.   # Autoimmune colitis/Opdivo induced diarrhea-s/p evaluation with GI; Dr.Wohl- improved- cutting down to 2.5 mg/day x2 weeks; and then 2.5 mg once every other day; do not stop until further directed.   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   # Mild  Anemia- improved- Hb 10.8  sec to chemo-STABLE.  Recommends restarting gentle iron.  Will check iron studies at next visit.  # PBF-BG- 143-  Monitor for now [Gluerna protein shakes]- continue taper the prednisone as above.   #Hx of goitre-/ TSH low-however normal free T3-T4- .currently on surveillance s/p evaluation with  Dr. Forde Dandy, [facility] endocrinology. STABLE.   # Vaccinations: ok with Flu shot; and Covid-19.; ok with RSV  # IV access: PIV.   Repeat BP # DISPOSITION:  # referral Physical therapy re: steroid myopathy # follow up in 4 weeks; - MD; labs-  cbc/cmp; iron studies; ferritin; NO treatment; . - Dr.B          No orders of the defined types were placed in this encounter.   All questions were answered. The patient knows to call the clinic with any problems, questions or concerns.      Cammie Sickle, MD 06/07/2022 10:23 AM

## 2022-06-09 DIAGNOSIS — Z961 Presence of intraocular lens: Secondary | ICD-10-CM | POA: Diagnosis not present

## 2022-06-09 DIAGNOSIS — H4301 Vitreous prolapse, right eye: Secondary | ICD-10-CM | POA: Diagnosis not present

## 2022-06-09 DIAGNOSIS — H40003 Preglaucoma, unspecified, bilateral: Secondary | ICD-10-CM | POA: Diagnosis not present

## 2022-06-09 DIAGNOSIS — H353131 Nonexudative age-related macular degeneration, bilateral, early dry stage: Secondary | ICD-10-CM | POA: Diagnosis not present

## 2022-06-09 LAB — HM DIABETES EYE EXAM

## 2022-06-16 ENCOUNTER — Ambulatory Visit
Admission: RE | Admit: 2022-06-16 | Discharge: 2022-06-16 | Disposition: A | Discharge status: Home / Self Care | Payer: Medicare PPO | Source: Ambulatory Visit | Attending: Radiation Oncology | Admitting: Radiation Oncology

## 2022-06-16 ENCOUNTER — Encounter: Payer: Self-pay | Admitting: Radiation Oncology

## 2022-06-16 VITALS — BP 156/77 | HR 102 | Temp 98.0°F | Resp 16 | Wt 119.4 lb

## 2022-06-16 DIAGNOSIS — C154 Malignant neoplasm of middle third of esophagus: Secondary | ICD-10-CM | POA: Diagnosis not present

## 2022-06-16 DIAGNOSIS — Z923 Personal history of irradiation: Secondary | ICD-10-CM | POA: Insufficient documentation

## 2022-06-16 DIAGNOSIS — C159 Malignant neoplasm of esophagus, unspecified: Secondary | ICD-10-CM | POA: Diagnosis not present

## 2022-06-16 DIAGNOSIS — Z87891 Personal history of nicotine dependence: Secondary | ICD-10-CM | POA: Diagnosis not present

## 2022-06-16 DIAGNOSIS — I7143 Infrarenal abdominal aortic aneurysm, without rupture: Secondary | ICD-10-CM | POA: Insufficient documentation

## 2022-06-16 NOTE — Progress Notes (Signed)
Radiation Oncology Follow up Note  Name: Heather Cooper   Date:   06/16/2022 MRN:  [id number] DOB: 05/18/1931    This 89 y.o. female presents to the clinic today for 24-month follow-up status post IMRT radiation therapy to her midesophagus for moderately differentiated squamous cell carcinoma in patients treated for stage IV adenocarcinoma of the lung back in 2015.  REFERRING PROVIDER: [name], MD  HPI: Patient is an 89 year old female now out 11 months having completed radiation therapy to her mid esophagus for moderate differentiated squamous cell carcinoma.  She also has a history of stage IV adenocarcinoma treated to her right lung back in 2015.  Seen today in routine follow-up she is doing well she is having no dysphagia at this time.  She had been on.  Opdivo although had GI side effects including diarrhea and that has been discontinued her diarrhea has cleared.  She recently had a PET CT scan last month showing no hypermetabolic evidence of residual or recurrent disease.  She does have a aneurysmal dilatation of the infrarenal abdominal aortic aneurysm measuring 4.6 cm which is being followed.  COMPLICATIONS OF TREATMENT: none  FOLLOW UP COMPLIANCE: keeps appointments   PHYSICAL EXAM:  BP (!) 156/77   Pulse (!) 102   Temp 98 F (36.7 C) (Tympanic)   Resp 16   Wt 119 lb 6.4 oz (54.2 kg)   BMI 21.15 kg/m  Well-developed well-nourished patient in NAD. HEENT reveals PERLA, EOMI, discs not visualized.  Oral cavity is clear. No oral mucosal lesions are identified. Neck is clear without evidence of cervical or supraclavicular adenopathy. Lungs are clear to A&P. Cardiac examination is essentially unremarkable with regular rate and rhythm without murmur rub or thrill. Abdomen is benign with no organomegaly or masses noted. Motor sensory and DTR levels are equal and symmetric in the upper and lower extremities. Cranial nerves II through XII are grossly intact. Proprioception is intact.  No peripheral adenopathy or edema is identified. No motor or sensory levels are noted. Crude visual fields are within normal range.  RADIOLOGY RESULTS: PET scan reviewed compatible with above-stated findings  PLAN: Present time patient is doing well under excellent control of her both lung cancer as well as esophageal cancer.  Temporarily on hold from [place] by medical oncology.  She has a follow-up next month with Dr. Jacinto Reap.  She has excellent results as far as PET scan reviewed.  I have asked to see her back in 6 months for follow-up.  Patient knows to call at anytime with any concerns.  I would like to take this opportunity to thank you for allowing me to participate in the care of your patient.Noreene Filbert, MD

## 2022-06-23 ENCOUNTER — Other Ambulatory Visit: Payer: Self-pay

## 2022-06-23 DIAGNOSIS — D490 Neoplasm of unspecified behavior of digestive system: Secondary | ICD-10-CM

## 2022-06-23 DIAGNOSIS — T380X5A Adverse effect of glucocorticoids and synthetic analogues, initial encounter: Secondary | ICD-10-CM

## 2022-06-24 ENCOUNTER — Telehealth: Payer: Self-pay | Admitting: *Deleted

## 2022-06-24 NOTE — Telephone Encounter (Signed)
Dr. Leonard Schwartz, would it be OK to double book one of your appts on 07/06/22 to accommodate patient for her appt with Marisue Humble?  Marisue Humble only comes on Wednesdays.

## 2022-06-24 NOTE — Telephone Encounter (Addendum)
Patient has been scheduled to see Marisue Humble on 07/06/22.    Britta Mccreedy, please move the lab/MD appts to 07/06/22 as well.  Not sure if patient will qualify for home PT at this time.

## 2022-06-24 NOTE — Telephone Encounter (Signed)
Patient called and asked if we  have sent referral for Physical Therapist to come to her home for therapy to "stretch my legs, I don't drive" I do not see that home Physical Therapist has been ordered a referral for OP Physical Therapist was entered 12/26, but she does not want that. Please call her back regarding this matter

## 2022-06-28 ENCOUNTER — Encounter: Payer: Self-pay | Admitting: Internal Medicine

## 2022-07-04 ENCOUNTER — Ambulatory Visit: Payer: Medicare PPO | Admitting: Radiation Oncology

## 2022-07-05 ENCOUNTER — Other Ambulatory Visit: Payer: Medicare PPO

## 2022-07-05 ENCOUNTER — Ambulatory Visit: Payer: Medicare PPO | Admitting: Internal Medicine

## 2022-07-05 DIAGNOSIS — I10 Essential (primary) hypertension: Secondary | ICD-10-CM | POA: Diagnosis not present

## 2022-07-05 DIAGNOSIS — C3490 Malignant neoplasm of unspecified part of unspecified bronchus or lung: Secondary | ICD-10-CM | POA: Diagnosis not present

## 2022-07-05 DIAGNOSIS — R7302 Impaired glucose tolerance (oral): Secondary | ICD-10-CM | POA: Diagnosis not present

## 2022-07-05 DIAGNOSIS — I7143 Infrarenal abdominal aortic aneurysm, without rupture: Secondary | ICD-10-CM | POA: Diagnosis not present

## 2022-07-05 DIAGNOSIS — E049 Nontoxic goiter, unspecified: Secondary | ICD-10-CM | POA: Diagnosis not present

## 2022-07-05 DIAGNOSIS — I2510 Atherosclerotic heart disease of native coronary artery without angina pectoris: Secondary | ICD-10-CM | POA: Diagnosis not present

## 2022-07-05 DIAGNOSIS — J432 Centrilobular emphysema: Secondary | ICD-10-CM | POA: Diagnosis not present

## 2022-07-05 DIAGNOSIS — I700 Atherosclerosis of aorta: Secondary | ICD-10-CM | POA: Diagnosis not present

## 2022-07-05 DIAGNOSIS — C154 Malignant neoplasm of middle third of esophagus: Secondary | ICD-10-CM | POA: Diagnosis not present

## 2022-07-06 ENCOUNTER — Encounter: Payer: Self-pay | Admitting: Internal Medicine

## 2022-07-06 ENCOUNTER — Inpatient Hospital Stay: Payer: Medicare PPO

## 2022-07-06 ENCOUNTER — Other Ambulatory Visit: Payer: Self-pay

## 2022-07-06 ENCOUNTER — Inpatient Hospital Stay (HOSPITAL_BASED_OUTPATIENT_CLINIC_OR_DEPARTMENT_OTHER): Payer: Medicare PPO | Admitting: Internal Medicine

## 2022-07-06 ENCOUNTER — Inpatient Hospital Stay: Payer: Medicare PPO | Attending: Internal Medicine | Admitting: Occupational Therapy

## 2022-07-06 VITALS — BP 120/70 | HR 105 | Temp 98.4°F | Wt 120.0 lb

## 2022-07-06 DIAGNOSIS — C154 Malignant neoplasm of middle third of esophagus: Secondary | ICD-10-CM | POA: Insufficient documentation

## 2022-07-06 DIAGNOSIS — Z5112 Encounter for antineoplastic immunotherapy: Secondary | ICD-10-CM

## 2022-07-06 DIAGNOSIS — D490 Neoplasm of unspecified behavior of digestive system: Secondary | ICD-10-CM

## 2022-07-06 DIAGNOSIS — C781 Secondary malignant neoplasm of mediastinum: Secondary | ICD-10-CM | POA: Insufficient documentation

## 2022-07-06 DIAGNOSIS — Z87891 Personal history of nicotine dependence: Secondary | ICD-10-CM | POA: Insufficient documentation

## 2022-07-06 DIAGNOSIS — Z85110 Personal history of malignant carcinoid tumor of bronchus and lung: Secondary | ICD-10-CM | POA: Insufficient documentation

## 2022-07-06 DIAGNOSIS — G720 Drug-induced myopathy: Secondary | ICD-10-CM

## 2022-07-06 DIAGNOSIS — R531 Weakness: Secondary | ICD-10-CM

## 2022-07-06 LAB — CBC WITH DIFFERENTIAL/PLATELET
Abs Immature Granulocytes: 0.02 10*3/uL (ref 0.00–0.07)
Basophils Absolute: 0.1 10*3/uL (ref 0.0–0.1)
Basophils Relative: 1 %
Eosinophils Absolute: 0.2 10*3/uL (ref 0.0–0.5)
Eosinophils Relative: 3 %
HCT: 36.3 % (ref 36.0–46.0)
Hemoglobin: 11.8 g/dL — ABNORMAL LOW (ref 12.0–15.0)
Immature Granulocytes: 0 %
Lymphocytes Relative: 21 %
Lymphs Abs: 1.6 10*3/uL (ref 0.7–4.0)
MCH: 29.4 pg (ref 26.0–34.0)
MCHC: 32.5 g/dL (ref 30.0–36.0)
MCV: 90.3 fL (ref 80.0–100.0)
Monocytes Absolute: 0.7 10*3/uL (ref 0.1–1.0)
Monocytes Relative: 8 %
Neutro Abs: 5.3 10*3/uL (ref 1.7–7.7)
Neutrophils Relative %: 67 %
Platelets: 261 10*3/uL (ref 150–400)
RBC: 4.02 MIL/uL (ref 3.87–5.11)
RDW: 14 % (ref 11.5–15.5)
WBC: 8 10*3/uL (ref 4.0–10.5)
nRBC: 0 % (ref 0.0–0.2)

## 2022-07-06 LAB — COMPREHENSIVE METABOLIC PANEL
ALT: 10 U/L (ref 0–44)
AST: 17 U/L (ref 15–41)
Albumin: 3.8 g/dL (ref 3.5–5.0)
Alkaline Phosphatase: 71 U/L (ref 38–126)
Anion gap: 10 (ref 5–15)
BUN: 16 mg/dL (ref 8–23)
CO2: 28 mmol/L (ref 22–32)
Calcium: 10 mg/dL (ref 8.9–10.3)
Chloride: 100 mmol/L (ref 98–111)
Creatinine, Ser: 0.87 mg/dL (ref 0.44–1.00)
GFR, Estimated: >60 mL/min (ref >60)
Glucose, Bld: 125 mg/dL — ABNORMAL HIGH (ref 70–99)
Potassium: 4 mmol/L (ref 3.5–5.1)
Sodium: 138 mmol/L (ref 135–145)
Total Bilirubin: 0.4 mg/dL (ref 0.3–1.2)
Total Protein: 7.3 g/dL (ref 6.5–8.1)

## 2022-07-06 LAB — FERRITIN: Ferritin: 88 ng/mL (ref 11–307)

## 2022-07-06 LAB — IRON AND TIBC
Iron: 81 ug/dL (ref 28–170)
Saturation Ratios: 23 % (ref 10.4–31.8)
TIBC: 354 ug/dL (ref 250–450)
UIBC: 273 ug/dL

## 2022-07-06 LAB — T4, FREE: Free T4: 0.82 ng/dL (ref 0.61–1.12)

## 2022-07-06 LAB — TSH: TSH: 0.778 u[IU]/mL (ref 0.350–4.500)

## 2022-07-06 NOTE — Progress Notes (Signed)
[facility] OFFICE PROGRESS NOTE  Patient Care Team: [name], MD as PCP - General (Internal Medicine) Clent Jacks, RN as Oncology Nurse Navigator Cammie Sickle, MD as Consulting Physician (Oncology) [name], MD as Consulting Physician (Gastroenterology) Borders, Kirt Boys, NP as Nurse Practitioner (Hospice and Palliative Medicine) Gloris Ham, RN as Registered Nurse (Oncology)   Cancer Staging  Neoplasm of middle third of esophagus Staging form: Esophagus - Squamous Cell Carcinoma, AJCC 8th Edition - Clinical: Stage Unknown (cTX, cN1, cM0) - Signed by Cammie Sickle, MD on 05/10/2021 - Pathologic: No stage assigned - Unsigned   Oncology History Overview Note  # 2015-patient is an 89 year old female with 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  Starting radiation and chemotherapy from April 14, 2014 Patient was started on carboplatinum andTaxol Herve were developed an allergic reaction to Taxol so would be changed to Abraxane 3.patient has finished 6 cycles of weekly chemotherapy with carboplatinum  and radiation therapy(May 28, 2014) 4.started on  NIVOLULAMAB because of persistent disease July 02, 2014. 5.  NIVOLULAMAB was discontinued because of persistent diarrhea in July of 2016.  August of 2016 CT scan was stable so no further chemotherapy  # AUG 25th PET- STABLE RUL MASS [radiation fibrosis; <1cm ? 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   APRIL 16th, 2023- PET scan-  Resolution of metabolic activity in the mid esophagus;  Persistent hypermetabolic  activity within a subcarinal lymph node. Differential includes residual carcinoma versus reactive adenopathy.  No evidence of distant metastatic disease.  # MAY 18th, 2023- s/p EUS [Dr.Spaete]-endoscopic biopsy of the mediastinal lymph node. MAY 18th, 2023- Status post endoscopic ultrasound/biopsy of the mediastinal/subcarinal lymph node- BIOPSY POSITIVE FOR RECURRENT SQUAMOUS CELL.  # JUNE 9th, 2023- Opdivo every 2 weeks.   # AAA/ 3.3x 3.9 Stable [Dr.Schnier] -----------------------------------------------------       History of lung cancer  Malignant neoplasm of right upper lobe of lung ([place])  08/07/2019 Initial Diagnosis   Malignant neoplasm of right upper lobe of lung ([place])    INTERVAL HISTORY: Ambulating with a rolling walker.  Accompanied by [name].   Heather Cooper 89 y.o.  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 Patient's Opdivo is currently on HOLD given the concern for autoimmune colitis from [facility].    Patient currently on prednisone tapering dose for immunotherapy induced colitis.  Doing great. Appetite is good. No diarrhea. A1C is down to 6.1. Holding opdivo. Having leg weakness.   Thyroid labs for Dr  Lurlean Leyden in [place]   She is more  up and about.  However she feels weak in the legs. Has an appt with OT today.  Review of Systems  Constitutional:  Positive for malaise/fatigue. Negative for chills, diaphoresis, fever and weight loss.  HENT:  Negative for nosebleeds and sore throat.   Eyes:  Negative for double vision.  Respiratory:  Negative for hemoptysis, sputum production, shortness of breath and wheezing.   Cardiovascular:  Negative for chest pain, palpitations, orthopnea and leg swelling.  Gastrointestinal:  Negative for abdominal pain, blood in stool, constipation, diarrhea, heartburn, melena, nausea and vomiting.  Genitourinary:  Negative for  dysuria, frequency and urgency.  Musculoskeletal:  Negative for back pain and joint pain.  Skin: Negative.  Negative for itching and rash.  Neurological:  Negative for dizziness, tingling, focal weakness, weakness and headaches.  Endo/Heme/Allergies:  Does not bruise/bleed easily.  Psychiatric/Behavioral:  Negative for depression. The patient is not nervous/anxious and does not have insomnia.    PAST MEDICAL HISTORY :  Past Medical History:  Diagnosis Date   Chemotherapy induced nausea and vomiting    Chicken pox    Esophageal cancer (HCC)    Hypercalcemia    familial hypocalciuric hypercalcemia   Hypercholesterolemia    Hypertension    Lung cancer ([place])    Osteoporosis    Thyroid disease     PAST SURGICAL HISTORY :   Past Surgical History:  Procedure Laterality Date   ABDOMINAL HYSTERECTOMY     partial   CHOLECYSTECTOMY     COLONOSCOPY WITH PROPOFOL N/A 04/17/2017   Procedure: COLONOSCOPY WITH PROPOFOL;  Surgeon: Manya Silvas, MD;  Location: [facility] [place] ENDOSCOPY;  Service: Endoscopy;  Laterality: N/A;   ESOPHAGOGASTRODUODENOSCOPY (EGD) WITH PROPOFOL N/A 04/13/2021   Procedure: ESOPHAGOGASTRODUODENOSCOPY (EGD) WITH PROPOFOL;  Surgeon: [name], MD;  Location: [facility] ENDOSCOPY;  Service: Endoscopy;  Laterality: N/A;   EUS N/A 04/22/2021   Procedure: FULL UPPER ENDOSCOPIC ULTRASOUND (EUS) RADIAL;  Surgeon: Jola Schmidt, MD;  Location: [facility] ENDOSCOPY;  Service: Endoscopy;  Laterality: N/A;  [facility] needed   EUS N/A 10/28/2021   Procedure: UPPER ENDOSCOPIC ULTRASOUND (EUS) LINEAR;  Surgeon: Reita Cliche, MD;  Location: [facility] ENDOSCOPY;  Service: Gastroenterology;  Laterality: N/A;  [facility]   transvaginal hysterectomy  04/18/05   with anterior colporrhaphy    FAMILY HISTORY :   Family History  Problem Relation Age of Onset   Stroke Mother    Hypertension Mother    Prostate cancer Father    Cancer Father        prostate   Heart disease Brother        s/p CABG   Colon  cancer Neg Hx     SOCIAL HISTORY:   Social History   Tobacco Use   Smoking status: Former    Types: Cigarettes    Quit date: 06/13/1997    Years since quitting: 25.0   Smokeless tobacco: Never  Vaping Use   Vaping Use: Never used  Substance Use Topics   Alcohol use: No    Alcohol/week: 0.0 standard drinks of alcohol   Drug use: No    ALLERGIES:  is allergic to paclitaxel.  MEDICATIONS:  Current Outpatient Medications  Medication Sig Dispense Refill   cholecalciferol (VITAMIN D3) 25 MCG (1000 UNIT) tablet Take 1,000 Units by mouth daily.     ferrous sulfate 325 (65 FE) MG EC tablet Take 325 mg by mouth daily with breakfast.     fluticasone (FLONASE) 50 MCG/ACT nasal spray Place 2 sprays into both nostrils daily. 16 g 3   levocetirizine (XYZAL) 5 MG tablet Take 1 tablet (5 mg total) by mouth every evening. 90 tablet 2   olmesartan-hydrochlorothiazide (BENICAR HCT) 20-12.5 MG tablet TAKE 1 TABLET BY MOUTH ONCE DAILY 90 tablet 1   pantoprazole (PROTONIX) 40 MG tablet TAKE 1 TABLET BY MOUTH ONCE DAILY 90 tablet 3   predniSONE (DELTASONE) 2.5 MG tablet Take 1 pill a day for 2 weeks; and then 1 pill every other day-do  not stop until directed. 30 tablet 0   albuterol (VENTOLIN HFA) 108 (90 Base) MCG/ACT inhaler Inhale 2 puffs into the lungs every 6 (six) hours as needed for wheezing or shortness of breath. (Patient not taking: Reported on 05/19/2022) 8 g 2   bismuth subsalicylate (PEPTO BISMOL) 262 MG/15ML suspension Take 30 mLs by mouth every 6 (six) hours as needed. (Patient not taking: Reported on 07/06/2022)     diphenoxylate-atropine (LOMOTIL) 2.5-0.025 MG tablet Take 1 tablet by mouth 4 (four) times daily as needed for diarrhea or loose stools. Take it along with immodium 60 tablet 1   loperamide (IMODIUM) 2 MG capsule Take 2 mg by mouth as needed for diarrhea or loose stools. (Patient not taking: Reported on 07/06/2022)     Magnesium Cl-Calcium Carbonate (SLOW MAGNESIUM/CALCIUM) 70-117  MG TBEC Take 1 tablet by mouth 2 (two) times daily. (Patient not taking: Reported on 05/19/2022) 60 tablet 6   potassium chloride (KLOR-CON M) 10 MEQ tablet Take 1 tablet (10 mEq total) by mouth daily. (Patient not taking: Reported on 05/19/2022) 10 tablet 0   No current facility-administered medications for this visit.   Facility-Administered Medications Ordered in Other Visits  Medication Dose Route Frequency Provider Last Rate Last Admin   0.9 %  sodium chloride infusion   Intravenous Continuous Borders, Kirt Boys, NP   Stopped at 01/21/22 1247   sodium chloride 0.9 % 1,000 mL with potassium chloride 20 mEq, magnesium sulfate 2 g infusion   Intravenous Continuous Forest Gleason, MD   Stopped at 12/04/14 1300    PHYSICAL EXAMINATION: ECOG PERFORMANCE STATUS: 0 - Asymptomatic  BP 120/70 (BP Location: Left Arm, Patient Position: Sitting)   Pulse (!) 105   Temp 98.4 F (36.9 C) (Tympanic)   Wt 120 lb (54.4 kg)   SpO2 100%   BMI 21.26 kg/m   Filed Weights   07/06/22 0848  Weight: 120 lb (54.4 kg)        Physical Exam HENT:     Head: Normocephalic and atraumatic.     Mouth/Throat:     Pharynx: No oropharyngeal exudate.  Eyes:     Pupils: Pupils are equal, round, and reactive to light.  Cardiovascular:     Rate and Rhythm: Normal rate and regular rhythm.  Pulmonary:     Effort: Pulmonary effort is normal. No respiratory distress.     Breath sounds: Normal breath sounds. No wheezing.  Abdominal:     General: Bowel sounds are normal. There is no distension.     Palpations: Abdomen is soft. There is no mass.     Tenderness: There is no abdominal tenderness. There is no guarding or rebound.  Musculoskeletal:        General: No tenderness. Normal range of motion.     Cervical back: Normal range of motion and neck supple.  Skin:    General: Skin is warm.  Neurological:     Mental Status: She is alert and oriented to person, place, and time.  Psychiatric:        Mood and  Affect: Affect normal.      LABORATORY DATA:  I have reviewed the data as listed    Component Value Date/Time   NA 138 07/06/2022 0826   NA 130 (L) 10/09/2014 0846   K 4.0 07/06/2022 0826   K 4.1 10/09/2014 0846   CL 100 07/06/2022 0826   CL 98 (L) 10/09/2014 0846   CO2 28 07/06/2022 0826   CO2 25 10/09/2014 0846  GLUCOSE 125 (H) 07/06/2022 0826   GLUCOSE 161 (H) 10/09/2014 0846   BUN 16 07/06/2022 0826   BUN 14 10/09/2014 0846   CREATININE 0.87 07/06/2022 0826   CREATININE 0.70 10/09/2014 0846   CALCIUM 10.0 07/06/2022 0826   CALCIUM 9.1 10/09/2014 0846   PROT 7.3 07/06/2022 0826   PROT 7.3 10/09/2014 0846   ALBUMIN 3.8 07/06/2022 0826   ALBUMIN 3.6 10/09/2014 0846   AST 17 07/06/2022 0826   AST 16 10/09/2014 0846   ALT 10 07/06/2022 0826   ALT 13 (L) 10/09/2014 0846   ALKPHOS 71 07/06/2022 0826   ALKPHOS 70 10/09/2014 0846   BILITOT 0.4 07/06/2022 0826   BILITOT 1.0 10/09/2014 0846   GFRNONAA >60 07/06/2022 0826   GFRNONAA >60 10/09/2014 0846   GFRAA >60 02/05/2020 1004   GFRAA >60 10/09/2014 0846    No results found for: "SPEP", "UPEP"  Lab Results  Component Value Date   WBC 8.0 07/06/2022   NEUTROABS 5.3 07/06/2022   HGB 11.8 (L) 07/06/2022   HCT 36.3 07/06/2022   MCV 90.3 07/06/2022   PLT 261 07/06/2022      Chemistry      Component Value Date/Time   NA 138 07/06/2022 0826   NA 130 (L) 10/09/2014 0846   K 4.0 07/06/2022 0826   K 4.1 10/09/2014 0846   CL 100 07/06/2022 0826   CL 98 (L) 10/09/2014 0846   CO2 28 07/06/2022 0826   CO2 25 10/09/2014 0846   BUN 16 07/06/2022 0826   BUN 14 10/09/2014 0846   CREATININE 0.87 07/06/2022 0826   CREATININE 0.70 10/09/2014 0846   GLU 111 03/18/2014 1048      Component Value Date/Time   CALCIUM 10.0 07/06/2022 0826   CALCIUM 9.1 10/09/2014 0846   ALKPHOS 71 07/06/2022 0826   ALKPHOS 70 10/09/2014 0846   AST 17 07/06/2022 0826   AST 16 10/09/2014 0846   ALT 10 07/06/2022 0826   ALT 13 (L)  10/09/2014 0846   BILITOT 0.4 07/06/2022 0826   BILITOT 1.0 10/09/2014 0846       RADIOGRAPHIC STUDIES: I have personally reviewed the radiological images as listed and agreed with the findings in the report. No results found.    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 No evidence of hypermetabolic residual or recurrent disease. NOV 11th, 2023-PET scan shows complete resolution of the disease.   # Continue to hold Opdivo at this time given patient's recent episode of severe diarrhea-likely related to  Opdivo/immunotherapy.  Will repeat imaging in first week of April.  PET scan ordered.  # Autoimmune colitis/Opdivo induced diarrhea-s/p evaluation with GI; Dr.Wohl- improved- currently on 2.5 mg/2.5 mg once every other day and stop in app x 1 week.  Stable.   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   # Mild  Anemia- improved- Hb 11.8 stable; continue gentle iron.  Awaiting iron studies today. Stable.   # PBF-BG- 125-  Monitor for now [Gluerna protein shakes]- continue taper the prednisone as above. Stable.   #Hx of goitre-/ TSH low-however normal free T3-T4- .currently on surveillance s/p evaluation with  Dr. Forde Dandy, [facility] endocrinology. Ordered labs per endo. stable   # Vaccinations: ok with Flu shot; and Covid-19.; ok with  RSV  # IV access: PIV.   # DISPOSITION:  # follow up in 1st week of April- MD; labs- cbc/cmp; NO treatment; prior-PET scan- Dr.B    Orders Placed This Encounter  Procedures   NM PET Image Restage (PS) Skull Base to Thigh (F-18 FDG)    Standing Status:   Future    Standing Expiration Date:   07/07/2023    Order Specific Question:   If indicated for the ordered procedure, I  authorize the administration of a radiopharmaceutical per Radiology protocol    Answer:   Yes    Order Specific Question:   Preferred imaging location?    Answer:   [facility]   TSH    Standing Status:   Future    Number of Occurrences:   1    Standing Expiration Date:   07/07/2023   T3, free    Standing Status:   Future    Number of Occurrences:   1    Standing Expiration Date:   07/07/2023   T4, free    Standing Status:   Future    Number of Occurrences:   1    Standing Expiration Date:   07/07/2023   CBC with Differential/Platelet    Standing Status:   Future    Standing Expiration Date:   07/07/2023   Comprehensive metabolic panel    Standing Status:   Future    Standing Expiration Date:   07/07/2023    All questions were answered. The patient knows to call the clinic with any problems, questions or concerns.      Cammie Sickle, MD 07/06/2022 9:29 AM

## 2022-07-06 NOTE — Therapy (Signed)
[place] at [facility] [address], [place], [place], [address] Phone: [phone]   Fax:  [phone]  Occupational Therapy Screen  Patient Details  Name: Heather Cooper MRN: [id number] Date of Birth: 1930/10/08 No data recorded  Encounter Date: 07/06/2022   OT End of Session - 07/06/22 1841     Visit Number 0             Past Medical History:  Diagnosis Date   Chemotherapy induced nausea and vomiting    Chicken pox    Esophageal cancer ([place])    Hypercalcemia    familial hypocalciuric hypercalcemia   Hypercholesterolemia    Hypertension    Lung cancer ([place])    Osteoporosis    Thyroid disease     Past Surgical History:  Procedure Laterality Date   ABDOMINAL HYSTERECTOMY     partial   CHOLECYSTECTOMY     COLONOSCOPY WITH PROPOFOL N/A 04/17/2017   Procedure: COLONOSCOPY WITH PROPOFOL;  Surgeon: Manya Silvas, MD;  Location: [facility] ENDOSCOPY;  Service: Endoscopy;  Laterality: N/A;   ESOPHAGOGASTRODUODENOSCOPY (EGD) WITH PROPOFOL N/A 04/13/2021   Procedure: ESOPHAGOGASTRODUODENOSCOPY (EGD) WITH PROPOFOL;  Surgeon: [name], MD;  Location: [facility] ENDOSCOPY;  Service: Endoscopy;  Laterality: N/A;   EUS N/A 04/22/2021   Procedure: FULL UPPER ENDOSCOPIC ULTRASOUND (EUS) RADIAL;  Surgeon: Jola Schmidt, MD;  Location: [facility] ENDOSCOPY;  Service: Endoscopy;  Laterality: N/A;  [facility] needed   EUS N/A 10/28/2021   Procedure: UPPER ENDOSCOPIC ULTRASOUND (EUS) LINEAR;  Surgeon: Reita Cliche, MD;  Location: [facility] ENDOSCOPY;  Service: Gastroenterology;  Laterality: N/A;  [facility]   transvaginal hysterectomy  04/18/05   with anterior colporrhaphy    There were no vitals filed for this visit.   Subjective Assessment - 07/06/22 1840     Subjective  I am doing okay.  I have seen you a year ago.  At that time I did had home health PT.  I tried to keep up with some of their exercises.  But about the last 5 to 69-month using my  walker now inside the house too.  I shuffle more and I do have a fear for falling.  And always holding onto things or reaching out for things.    Currently in Pain? No/denies                  Cammie Sickle, MD 07/06/2022 9:29 AM ASSESSMENT & PLAN:  Neoplasm of middle third of esophagus # Squamous cell carcinoma of the mid-esophagus [NOV 2022]-  locally advanced; RECURRENT  MAY 18th, 2023-most recently status post Opdivoq 2 W x4 cycles-January 26 2022-PET scan: Resolution of the previously described hypermetabolic subcarinal node. No evidence of hypermetabolic residual or recurrent disease. NOV 11th, 2023-PET scan shows complete resolution of the disease.    # Continue to hold Opdivo at this time given patient's recent episode of severe diarrhea-likely related to  Opdivo/immunotherapy.  Will repeat imaging in first week of April.  PET scan ordered.   # Autoimmune colitis/Opdivo induced diarrhea-s/p evaluation with GI; Dr.Wohl- improved- currently on 2.5 mg/2.5 mg once every other day and stop in app x 1 week.  Stable.    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    # Mild  Anemia- improved- Hb 11.8 stable; continue gentle iron.  Awaiting iron studies today. Stable.    # PBF-BG- 125-  Monitor for now [Gluerna protein shakes]- continue taper the prednisone as  above. Stable.    #Hx of goitre-/ TSH low-however normal free T3-T4- .currently on surveillance s/p evaluation with  Dr. Forde Dandy, [facility] endocrinology. Ordered labs per endo. stable    # Vaccinations: ok with Flu shot; and Covid-19.; ok with RSV   # IV access: PIV.    # DISPOSITION:  # follow up in 1st week of April- MD; labs- cbc/cmp; NO treatment; prior-PET scan- Dr.B     OT SCREEN 07/06/22:    Patient and daughter seen today for OT screen.  Patient and daughter report patient depending on walker in the house now to the last 5 to 6 months.  Patient present with a shuffled gait.  Reaching and holding  onto objects when walking or standing. Patient was seen a year ago and was referred to home health. This date done BERG balance test.  Patient scored 21 out of 56.  Putting her at the border for high risk for falling.  Patient denies any falls the last 6 months to year. Reports she feels more secure with a walker or holding onto something. Patient would not be eligible I think for home health.  Patient limited by fear for falling making her base of support bigger and  holding onto objects. Shuffling more and unable to take large steps. Recommend outpatient PT.  Patient's daughter or son can bring her to therapy.                           Visit Diagnosis: Steroid myopathy  Weakness    Problem List Patient Active Problem List   Diagnosis Date Noted   Encounter for immunotherapy 04/26/2022   Low TSH level 11/23/2021   PAD (peripheral artery disease) ([place]) 08/28/2021   Emphysema of lung ([place]) 08/28/2021   Esophageal cancer ([place]) 05/30/2021   Diarrhea 05/30/2021   Neoplasm of middle third of esophagus 05/10/2021   Stricture and stenosis of esophagus    Dysphagia 01/22/2021   Type 2 diabetes mellitus with hyperglycemia ([place]) 01/19/2021   COVID-19 virus infection 06/21/2020   Anemia 05/11/2020   Malignant neoplasm of right upper lobe of lung ([place]) 08/07/2019   URI (upper respiratory infection) 09/16/2016   Health care maintenance 05/18/2016   History of lung cancer 02/08/2016   Abdominal aortic aneurysm ([employer]) 08/20/2015   Abnormal CXR 03/06/2014   Cough 03/06/2014   Headache(784.0) 03/06/2014   Hyperglycemia 02/07/2013   Hypertension 06/26/2012   Hypercholesterolemia 06/26/2012   Osteoporosis 06/26/2012   Hypercalcemia 06/26/2012   GERD (gastroesophageal reflux disease) 06/26/2012    Rosalyn Gess, OTR/L,CLT 07/06/2022, 6:42 PM  [place] at [facility] [address] [place], [place], [address] Phone:  [phone]   Fax:  [phone]  Name: Heather Cooper MRN: [id number] Date of Birth: June 28, 1930

## 2022-07-06 NOTE — Progress Notes (Signed)
Doing great. Appetite is good. No diarrhea. A1C is down to 6.1. Holding opdivo. Having leg weakness. Has an appt with OT today. Dr B approved ordering thyroid labs for Dr  Lurlean Leyden in [place]

## 2022-07-06 NOTE — Assessment & Plan Note (Addendum)
#  Squamous cell carcinoma of the mid-esophagus [NOV 2022]-  locally advanced; RECURRENT  MAY 18th, 2023-most recently status post Opdivoq 2 W x4 cycles-January 26 2022-PET scan: Resolution of the previously described hypermetabolic subcarinal node. No evidence of hypermetabolic residual or recurrent disease. NOV 11th, 2023-PET scan shows complete resolution of the disease.   # Continue to hold Opdivo at this time given patient's recent episode of severe diarrhea-likely related to  Opdivo/immunotherapy.  Will repeat imaging in first week of April.  PET scan ordered.  # Autoimmune colitis/Opdivo induced diarrhea-s/p evaluation with GI; Dr.Wohl- improved- currently on 2.5 mg/2.5 mg once every other day and stop in app x 1 week.  Stable.   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   # Mild  Anemia- improved- Hb 11.8 stable; continue gentle iron.  Awaiting iron studies today. Stable.   # PBF-BG- 125-  Monitor for now [Gluerna protein shakes]- continue taper the prednisone as above. Stable.   #Hx of goitre-/ TSH low-however normal free T3-T4- .currently on surveillance s/p evaluation with  Dr. Forde Dandy, [facility] endocrinology. Ordered labs per endo. stable   # Vaccinations: ok with Flu shot; and Covid-19.; ok with RSV  # IV access: PIV.   # DISPOSITION:  # follow up in 1st week of April- MD; labs- cbc/cmp; NO treatment; prior-PET scan- Dr.B

## 2022-07-08 LAB — T3, FREE: T3, Free: 4.3 pg/mL (ref 2.0–4.4)

## 2022-07-12 ENCOUNTER — Ambulatory Visit: Payer: Medicare PPO | Attending: Internal Medicine

## 2022-07-12 DIAGNOSIS — R262 Difficulty in walking, not elsewhere classified: Secondary | ICD-10-CM | POA: Diagnosis not present

## 2022-07-12 DIAGNOSIS — R29898 Other symptoms and signs involving the musculoskeletal system: Secondary | ICD-10-CM | POA: Diagnosis not present

## 2022-07-12 DIAGNOSIS — M6281 Muscle weakness (generalized): Secondary | ICD-10-CM | POA: Insufficient documentation

## 2022-07-12 DIAGNOSIS — D490 Neoplasm of unspecified behavior of digestive system: Secondary | ICD-10-CM | POA: Diagnosis not present

## 2022-07-12 NOTE — Therapy (Addendum)
OUTPATIENT PHYSICAL THERAPY NEURO EVALUATION   Patient Name: Heather Cooper MRN: [id number] DOB:15-Sep-1930, 89 y.o., female Today's Date: 07/12/2022   PCP: [name], MD REFERRING PROVIDER: Charlaine Dalton, MD  END OF SESSION:  PT End of Session - 07/12/22 1101     Visit Number 1    Number of Visits 24    Date for PT Re-Evaluation 10/04/22    PT Start Time 1057    PT Stop Time 8592    PT Time Calculation (min) 48 min    Equipment Utilized During Treatment Gait belt    Activity Tolerance Patient tolerated treatment well    Behavior During Therapy WFL for tasks assessed/performed             Past Medical History:  Diagnosis Date   Chemotherapy induced nausea and vomiting    Chicken pox    Esophageal cancer ([place])    Hypercalcemia    familial hypocalciuric hypercalcemia   Hypercholesterolemia    Hypertension    Lung cancer ([place])    Osteoporosis    Thyroid disease    Past Surgical History:  Procedure Laterality Date   ABDOMINAL HYSTERECTOMY     partial   CHOLECYSTECTOMY     COLONOSCOPY WITH PROPOFOL N/A 04/17/2017   Procedure: COLONOSCOPY WITH PROPOFOL;  Surgeon: Manya Silvas, MD;  Location: [facility] ENDOSCOPY;  Service: Endoscopy;  Laterality: N/A;   ESOPHAGOGASTRODUODENOSCOPY (EGD) WITH PROPOFOL N/A 04/13/2021   Procedure: ESOPHAGOGASTRODUODENOSCOPY (EGD) WITH PROPOFOL;  Surgeon: [name], MD;  Location: [facility] ENDOSCOPY;  Service: Endoscopy;  Laterality: N/A;   EUS N/A 04/22/2021   Procedure: FULL UPPER ENDOSCOPIC ULTRASOUND (EUS) RADIAL;  Surgeon: Jola Schmidt, MD;  Location: [facility] ENDOSCOPY;  Service: Endoscopy;  Laterality: N/A;  [facility] needed   EUS N/A 10/28/2021   Procedure: UPPER ENDOSCOPIC ULTRASOUND (EUS) LINEAR;  Surgeon: Reita Cliche, MD;  Location: [facility] ENDOSCOPY;  Service: Gastroenterology;  Laterality: N/A;  [facility]   transvaginal hysterectomy  04/18/05   with anterior colporrhaphy   Patient Active Problem List   Diagnosis Date  Noted   Encounter for immunotherapy 04/26/2022   Low TSH level 11/23/2021   PAD (peripheral artery disease) ([place]) 08/28/2021   Emphysema of lung ([place]) 08/28/2021   Esophageal cancer ([place]) 05/30/2021   Diarrhea 05/30/2021   Neoplasm of middle third of esophagus 05/10/2021   Stricture and stenosis of esophagus    Dysphagia 01/22/2021   Type 2 diabetes mellitus with hyperglycemia ([place]) 01/19/2021   COVID-19 virus infection 06/21/2020   Anemia 05/11/2020   Malignant neoplasm of right upper lobe of lung ([place]) 08/07/2019   URI (upper respiratory infection) 09/16/2016   Health care maintenance 05/18/2016   History of lung cancer 02/08/2016   Abdominal aortic aneurysm ([place]) 08/20/2015   Abnormal CXR 03/06/2014   Cough 03/06/2014   Headache(784.0) 03/06/2014   Hyperglycemia 02/07/2013   Hypertension 06/26/2012   Hypercholesterolemia 06/26/2012   Osteoporosis 06/26/2012   Hypercalcemia 06/26/2012   GERD (gastroesophageal reflux disease) 06/26/2012    ONSET DATE: 9 months, 10/2021  REFERRING DIAG: M62.81 (ICD-10-CM) - Muscle weakness (generalized)   THERAPY DIAG:  Muscle weakness (generalized)  Difficulty in walking, not elsewhere classified  Weakness of both hips  Rationale for Evaluation and Treatment: Rehabilitation  SUBJECTIVE:  SUBJECTIVE STATEMENT:  Pt is coming to therapy for weakness s/p cancer diagnosis  Pt accompanied by: family member, daughter Delphia Grates  PERTINENT HISTORY:  Pt developed has history of cancer x2, but is in remission.  Pt has been on prednisone for 3 months, but should be off it in the next few days.  MD feels as though that has contributed to her weakness.  Pt has caregiver that comes over 3x week that helps washing clothes due to having some things on the  ground floor.  PAIN:  Are you having pain? No  PRECAUTIONS: Other: Cancer  WEIGHT BEARING RESTRICTIONS: No  FALLS: Has patient fallen in last 6 months? No  LIVING ENVIRONMENT: Lives with: lives alone Lives in: House/apartment Stairs: Yes: Internal: 13 steps; can reach both Has following equipment at home: Single point cane, Walker - 4 wheeled, shower chair, and Grab bars  PLOF: Independent and needing some assistance with driving.  PATIENT GOALS: To get stronger and stop utilizing the walker as much.  OBJECTIVE:   DIAGNOSTIC FINDINGS:   EXAM: NUCLEAR MEDICINE PET SKULL BASE TO THIGH  IMPRESSION: 1. No scintigraphic evidence of residual or recurrent esophageal hypermetabolism. 2. No evidence of hypermetabolic metastatic disease. 3. Similar aneurysmal dilation of the infrarenal abdominal aorta measuring 4.6 cm. Recommend follow-up CT/MR every 6 months and vascular consultation. This recommendation follows ACR consensus guidelines: White Paper of the ACR Incidental Findings Committee II on Vascular Findings. J Am Coll Radiol 2013; 81:017-510. 4. Aortic Atherosclerosis (ICD10-I70.0) and Emphysema (ICD10-J43.9).  COGNITION: Overall cognitive status: Within functional limits for tasks assessed   SENSATION: WFL  COORDINATION: WFL  POSTURE: No Significant postural limitations  LOWER EXTREMITY ROM:     Active  Right Eval Left Eval  Hip flexion    Hip extension    Hip abduction    Hip adduction    Hip internal rotation    Hip external rotation    Knee flexion    Knee extension    Ankle dorsiflexion    Ankle plantarflexion    Ankle inversion    Ankle eversion     (Blank rows = not tested)  LOWER EXTREMITY MMT:    MMT Right Eval Left Eval  Hip flexion 4 4  Hip abduction 4 4  Hip adduction 4- 4-  Knee flexion 4- 4-  Knee extension 4- 4-  Ankle dorsiflexion 4- 4-  (Blank rows = not tested)  FUNCTIONAL TESTS:  5 times sit to stand: 34.89 sec Timed  up and go (TUG): 27.56 sec 6 minute walk test: Perform at next session 10 meter walk test: 32.77 sec Berg Balance Scale: 37/56  PATIENT SURVEYS:  FOTO 45/54  TODAY'S TREATMENT: DATE: 07/12/22  Eval Only   PATIENT EDUCATION: Education details: Pt educated on role of PT and services provided during current POC, along with prognosis and information about the clinic. Person educated: Patient and Child(ren) Education method: Explanation Education comprehension: verbalized understanding  HOME EXERCISE PROGRAM:    GOALS: Goals reviewed with patient? Yes  SHORT TERM GOALS: Target date: 08/09/2022  Pt will be independent with HEP in order to demonstrate increased ability to perform tasks related to occupation/hobbies. Baseline: Pt to be given HEP at next visit Goal status: INITIAL  LONG TERM GOALS: Target date: 10/04/2022  1.  Patient (> 25 years old) will complete five times sit to stand test in < 15 seconds indicating an increased LE strength and improved balance. Baseline: 34.89 sec Goal status: INITIAL  2.  Patient will increase FOTO score to equal to or greater than 54 to demonstrate statistically significant improvement in mobility and quality of life.  Baseline: FOTO: 45 Goal status: INITIAL   3.  Patient will increase Berg Balance score by > 6 points to demonstrate decreased fall risk during functional activities. Baseline: BERG: 37 Goal status: INITIAL   4.  Patient will reduce timed up and go to <11 seconds to reduce fall risk and demonstrate improved transfer/gait ability. Baseline: 27.56 sec Goal status: INITIAL  5.  Patient will increase 10 meter walk test to >1.79m/s as to improve gait speed for better community ambulation and to reduce fall risk. Baseline: 32.77 sec Goal status: INITIAL  6.  Patient will increase six minute walk test distance to >1000 for progression to community ambulator and improve gait ability Baseline: Perform at subsequent visit Goal  status: INITIAL    ASSESSMENT:  CLINICAL IMPRESSION: Patient is a 89 y.o. female who was seen today for physical therapy evaluation and treatment for general weakness.  Pt presents with physical impairments of decreased activity tolerance, decreased balance, and decreased strength in B LE as noted.  Pt will benefit from skilled therapy to address tolerance, ROM, pain, and strength impairments necessary for improvement in quality of life.  Pt. demonstrates understanding of this plan of care and agrees with this plan.    OBJECTIVE IMPAIRMENTS: Abnormal gait, decreased activity tolerance, decreased balance, decreased endurance, decreased knowledge of use of DME, decreased mobility, difficulty walking, and decreased strength.   ACTIVITY LIMITATIONS: carrying, lifting, standing, squatting, stairs, transfers, bed mobility, continence, bathing, toileting, dressing, and locomotion level  PARTICIPATION LIMITATIONS: cleaning, laundry, driving, shopping, community activity, yard work, and church  PERSONAL FACTORS: Age, Fitness, Past/current experiences, and Time since onset of injury/illness/exacerbation are also affecting patient's functional outcome.   REHAB POTENTIAL: Good  CLINICAL DECISION MAKING: Stable/uncomplicated  EVALUATION COMPLEXITY: Low  PLAN:  PT FREQUENCY: 2x/week  PT DURATION: 12 weeks  PLANNED INTERVENTIONS: Therapeutic exercises, Therapeutic activity, Neuromuscular re-education, Balance training, Gait training, Patient/Family education, Self Care, Joint mobilization, Stair training, Vestibular training, Canalith repositioning, DME instructions, Aquatic Therapy, Dry Needling, Cryotherapy, Moist heat, Taping, Manual therapy, and Re-evaluation  PLAN FOR NEXT SESSION:  6MWT, HEP, continue with balance and LE strengthening exercises    Gwenlyn Saran, PT, DPT Physical Therapist - [facility]  07/12/22, 5:53 PM

## 2022-07-13 ENCOUNTER — Ambulatory Visit (INDEPENDENT_AMBULATORY_CARE_PROVIDER_SITE_OTHER): Payer: Medicare PPO | Admitting: Internal Medicine

## 2022-07-13 ENCOUNTER — Encounter: Payer: Self-pay | Admitting: Internal Medicine

## 2022-07-13 VITALS — BP 132/76 | HR 95 | Temp 97.6°F | Resp 16 | Ht 63.0 in | Wt 120.6 lb

## 2022-07-13 DIAGNOSIS — K219 Gastro-esophageal reflux disease without esophagitis: Secondary | ICD-10-CM

## 2022-07-13 DIAGNOSIS — I714 Abdominal aortic aneurysm, without rupture, unspecified: Secondary | ICD-10-CM

## 2022-07-13 DIAGNOSIS — J439 Emphysema, unspecified: Secondary | ICD-10-CM | POA: Diagnosis not present

## 2022-07-13 DIAGNOSIS — E7800 Pure hypercholesterolemia, unspecified: Secondary | ICD-10-CM

## 2022-07-13 DIAGNOSIS — I739 Peripheral vascular disease, unspecified: Secondary | ICD-10-CM

## 2022-07-13 DIAGNOSIS — I10 Essential (primary) hypertension: Secondary | ICD-10-CM | POA: Diagnosis not present

## 2022-07-13 DIAGNOSIS — D649 Anemia, unspecified: Secondary | ICD-10-CM

## 2022-07-13 DIAGNOSIS — C159 Malignant neoplasm of esophagus, unspecified: Secondary | ICD-10-CM

## 2022-07-13 DIAGNOSIS — E1165 Type 2 diabetes mellitus with hyperglycemia: Secondary | ICD-10-CM

## 2022-07-13 DIAGNOSIS — Z85118 Personal history of other malignant neoplasm of bronchus and lung: Secondary | ICD-10-CM | POA: Diagnosis not present

## 2022-07-13 DIAGNOSIS — R739 Hyperglycemia, unspecified: Secondary | ICD-10-CM

## 2022-07-13 DIAGNOSIS — R7989 Other specified abnormal findings of blood chemistry: Secondary | ICD-10-CM

## 2022-07-13 NOTE — Progress Notes (Signed)
Patient ID: Heather Cooper, female   DOB: May 12, 1931, 89 y.o.   MRN: [id number]   Subjective:    Patient ID: Heather Cooper, female    DOB: [birth year], 89 y.o.   MRN: [id number]   Patient here for a scheduled follow up.  [name]   HPI Here to follow up regarding hypercholesterolemia, hypertension and currently being treated for esophageal cancer.  Seeing Dr Rogue Bussing.  Just evaluated 07/06/22.  Continuing to hold opdivo. Diarrhea improved.  Tapering prednisone.  Does have bilateral thigh weakness.  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  Started PT.  Saw vascular - 05/19/22 - Duplex US of the aorta and iliac arteries shows an AAA measured 4.7 cm.  No significant change compared to the studies 2+ years ago which measured her AAA at 4.68 cm. Recommended f/u in 12 months. Overall she feels she is doing relatively well.  Eating. No chest pain or increased sob reported.  No increased cough or congestion.    Past Medical History:  Diagnosis Date   Chemotherapy induced nausea and vomiting    Chicken pox    Esophageal cancer ([facility])    Hypercalcemia    familial hypocalciuric hypercalcemia   Hypercholesterolemia    Hypertension    Lung cancer ([place])    Osteoporosis    Thyroid disease    Past Surgical History:  Procedure Laterality Date   ABDOMINAL HYSTERECTOMY     partial   CHOLECYSTECTOMY     COLONOSCOPY WITH PROPOFOL N/A 04/17/2017   Procedure: COLONOSCOPY WITH PROPOFOL;  Surgeon: Manya Silvas, MD;  Location: [facility] ENDOSCOPY;  Service: Endoscopy;  Laterality: N/A;   ESOPHAGOGASTRODUODENOSCOPY (EGD) WITH PROPOFOL N/A 04/13/2021   Procedure: ESOPHAGOGASTRODUODENOSCOPY (EGD) WITH PROPOFOL;  Surgeon: [name], MD;  Location: [facility] ENDOSCOPY;  Service: Endoscopy;  Laterality: N/A;   EUS N/A 04/22/2021   Procedure: FULL UPPER ENDOSCOPIC ULTRASOUND (EUS) RADIAL;  Surgeon: Jola Schmidt, MD;  Location: [facility] ENDOSCOPY;  Service: Endoscopy;  Laterality: N/A;  [facility] needed   EUS N/A 10/28/2021    Procedure: UPPER ENDOSCOPIC ULTRASOUND (EUS) LINEAR;  Surgeon: Reita Cliche, MD;  Location: [facility] ENDOSCOPY;  Service: Gastroenterology;  Laterality: N/A;  [facility]   transvaginal hysterectomy  04/18/05   with anterior colporrhaphy   Family History  Problem Relation Age of Onset   Stroke Mother    Hypertension Mother    Prostate cancer Father    Cancer Father        prostate   Heart disease Brother        s/p CABG   Colon cancer Neg Hx    Social History   Socioeconomic History   Marital status: Widowed    Spouse name: Not on file   Number of children: 3   Years of education: Not on file   Highest education level: Not on file  Occupational History   Not on file  Tobacco Use   Smoking status: Former    Types: Cigarettes    Quit date: 06/13/1997    Years since quitting: 25.1   Smokeless tobacco: Never  Vaping Use   Vaping Use: Never used  Substance and Sexual Activity   Alcohol use: No    Alcohol/week: 0.0 standard drinks of alcohol   Drug use: No   Sexual activity: Not Currently  Other Topics Concern   Not on file  Social History Narrative   Lost her husband November 2019   Social Determinants of Health   Financial Resource Strain: Low Risk  (01/25/2021)  Overall Financial Resource Strain (CARDIA)    Difficulty of Paying Living Expenses: Not hard at all  Food Insecurity: No Food Insecurity (04/11/2022)   Hunger Vital Sign    Worried About Running Out of Food in the Last Year: Never true    Ran Out of Food in the Last Year: Never true  Transportation Needs: No Transportation Needs (04/11/2022)   PRAPARE - Hydrologist (Medical): No    Lack of Transportation (Non-Medical): No  Physical Activity: Unknown (01/25/2021)   Exercise Vital Sign    Days of Exercise per Week: 0 days    Minutes of Exercise per Session: Not on file  Stress: No Stress Concern Present (04/11/2022)   [place]    Feeling of Stress : Not at all  Social Connections: Socially Isolated (04/11/2022)   Social Connection and Isolation Panel [NHANES]    Frequency of Communication with Friends and Family: Never    Frequency of Social Gatherings with Friends and Family: Never    Attends Religious Services: Never    Marine scientist or Organizations: No    Attends Archivist Meetings: Never    Marital Status: Widowed     Review of Systems  Constitutional:  Negative for appetite change and unexpected weight change.  HENT:  Negative for congestion and sinus pressure.   Respiratory:  Negative for cough, chest tightness and shortness of breath.   Cardiovascular:  Negative for chest pain, palpitations and leg swelling.  Gastrointestinal:  Negative for abdominal pain, nausea and vomiting.       Diarrhea improved.   Genitourinary:  Negative for difficulty urinating and dysuria.  Musculoskeletal:  Negative for joint swelling and myalgias.  Skin:  Negative for color change and rash.  Neurological:  Negative for dizziness, light-headedness and headaches.  Psychiatric/Behavioral:  Negative for agitation and dysphoric mood.        Objective:     BP 132/76   Pulse 95   Temp 97.6 F (36.4 C)   Resp 16   Ht 5\' 3"  (1.6 m)   Wt 120 lb 9.6 oz (54.7 kg)   SpO2 98%   BMI 21.36 kg/m  Wt Readings from Last 3 Encounters:  07/13/22 120 lb 9.6 oz (54.7 kg)  07/06/22 120 lb (54.4 kg)  06/16/22 119 lb 6.4 oz (54.2 kg)    Physical Exam Vitals reviewed.  Constitutional:      General: She is not in acute distress.    Appearance: Normal appearance.  HENT:     Head: Normocephalic and atraumatic.     Right Ear: External ear normal.     Left Ear: External ear normal.  Eyes:     General: No scleral icterus.       Right eye: No discharge.        Left eye: No discharge.     Conjunctiva/sclera: Conjunctivae normal.  Neck:     Thyroid: No thyromegaly.  Cardiovascular:     Rate  and Rhythm: Normal rate and regular rhythm.  Pulmonary:     Effort: No respiratory distress.     Breath sounds: Normal breath sounds. No wheezing.  Abdominal:     General: Bowel sounds are normal.     Palpations: Abdomen is soft.     Tenderness: There is no abdominal tenderness.  Musculoskeletal:        General: No swelling or tenderness.     Cervical back:  Neck supple. No tenderness.  Lymphadenopathy:     Cervical: No cervical adenopathy.  Skin:    Findings: No erythema or rash.  Neurological:     Mental Status: She is alert.  Psychiatric:        Mood and Affect: Mood normal.        Behavior: Behavior normal.      Outpatient Encounter Medications as of 07/13/2022  Medication Sig   cholecalciferol (VITAMIN D3) 25 MCG (1000 UNIT) tablet Take 1,000 Units by mouth daily.   ferrous sulfate 325 (65 FE) MG EC tablet Take 325 mg by mouth daily with breakfast.   fluticasone (FLONASE) 50 MCG/ACT nasal spray Place 2 sprays into both nostrils daily.   levocetirizine (XYZAL) 5 MG tablet Take 1 tablet (5 mg total) by mouth every evening.   olmesartan-hydrochlorothiazide (BENICAR HCT) 20-12.5 MG tablet TAKE 1 TABLET BY MOUTH ONCE DAILY   pantoprazole (PROTONIX) 40 MG tablet TAKE 1 TABLET BY MOUTH ONCE DAILY   predniSONE (DELTASONE) 2.5 MG tablet Take 1 pill a day for 2 weeks; and then 1 pill every other day-do not stop until directed.   [DISCONTINUED] albuterol (VENTOLIN HFA) 108 (90 Base) MCG/ACT inhaler Inhale 2 puffs into the lungs every 6 (six) hours as needed for wheezing or shortness of breath. (Patient not taking: Reported on 05/19/2022)   [DISCONTINUED] bismuth subsalicylate (PEPTO BISMOL) 262 MG/15ML suspension Take 30 mLs by mouth every 6 (six) hours as needed. (Patient not taking: Reported on 07/06/2022)   [DISCONTINUED] diphenoxylate-atropine (LOMOTIL) 2.5-0.025 MG tablet Take 1 tablet by mouth 4 (four) times daily as needed for diarrhea or loose stools. Take it along with immodium    [DISCONTINUED] loperamide (IMODIUM) 2 MG capsule Take 2 mg by mouth as needed for diarrhea or loose stools. (Patient not taking: Reported on 07/06/2022)   [DISCONTINUED] Magnesium Cl-Calcium Carbonate (SLOW MAGNESIUM/CALCIUM) 70-117 MG TBEC Take 1 tablet by mouth 2 (two) times daily. (Patient not taking: Reported on 05/19/2022)   [DISCONTINUED] potassium chloride (KLOR-CON M) 10 MEQ tablet Take 1 tablet (10 mEq total) by mouth daily. (Patient not taking: Reported on 05/19/2022)   [DISCONTINUED] prochlorperazine (COMPAZINE) 10 MG tablet Take 1 tablet (10 mg total) by mouth every 6 (six) hours as needed (Nausea or vomiting). (Patient not taking: Reported on 09/29/2021)   Facility-Administered Encounter Medications as of 07/13/2022  Medication   0.9 %  sodium chloride infusion   sodium chloride 0.9 % 1,000 mL with potassium chloride 20 mEq, magnesium sulfate 2 g infusion     Lab Results  Component Value Date   WBC 8.0 07/06/2022   HGB 11.8 (L) 07/06/2022   HCT 36.3 07/06/2022   PLT 261 07/06/2022   GLUCOSE 125 (H) 07/06/2022   CHOL 171 08/26/2021   TRIG 182.0 (H) 08/26/2021   HDL 39.10 08/26/2021   LDLCALC 96 08/26/2021   ALT 10 07/06/2022   AST 17 07/06/2022   NA 138 07/06/2022   K 4.0 07/06/2022   CL 100 07/06/2022   CREATININE 0.87 07/06/2022   BUN 16 07/06/2022   CO2 28 07/06/2022   TSH 0.778 07/06/2022   INR 1.0 03/18/2014   INR 1.0 03/18/2014   HGBA1C 5.5 08/26/2021   MICROALBUR 5.2 (H) 01/19/2021    NM PET Image Restage (PS) Skull Base to Thigh (F-18 FDG)  Result Date: 01/27/2022 CLINICAL DATA:  Subsequent treatment strategy for esophageal cancer involving the mid third. Remote history of lung cancer. Final radiation therapy in January. Endoscopic ultrasound biopsy 10/28/2021 demonstrated  recurrence. Immunotherapy including in July of 2023. EXAM: NUCLEAR MEDICINE PET SKULL BASE TO THIGH TECHNIQUE: 7.2 mCi F-18 FDG was injected intravenously. Full-ring PET imaging was performed  from the skull base to thigh after the radiotracer. CT data was obtained and used for attenuation correction and anatomic localization. Fasting blood glucose: 115 mg/dl COMPARISON:  09/27/2021 FINDINGS: Mediastinal blood pool activity: SUV max 1.8 Liver activity: SUV max NA NECK: No areas of abnormal hypermetabolism. Incidental CT findings: No cervical adenopathy. Left carotid atherosclerosis. CHEST: The previously described hypermetabolic subcarinal node has resolved. Only minimal nodal tissue remains in this region. No residual or recurrent esophageal hypermetabolism. Incidental CT findings: Centrilobular emphysema with paramediastinal radiation fibrosis within the right greater than left upper lobes. ABDOMEN/PELVIS: No abdominopelvic parenchymal or nodal hypermetabolism. Incidental CT findings: Cholecystectomy. Common duct dilatation at 1.4 cm on 124/2 is similar and has been present back to at least 07/03/2018 CT. Normal adrenal glands. Normal noncontrast appearance of the liver, gallbladder, kidneys, spleen. Scattered colonic diverticula. Hysterectomy. Pelvic floor laxity. Infrarenal abdominal aortic aneurysm is similar at 4.1 cm. No surrounding hemorrhage. SKELETON: No abnormal marrow activity. Incidental CT findings: Osteopenia. IMPRESSION: 1. Resolution of the previously described hypermetabolic subcarinal node. No evidence of hypermetabolic residual or recurrent disease. 2. Infrarenal abdominal aortic aneurysm, similar at 4.1 cm. Recommend follow-up every 12 months and vascular consultation. This recommendation follows ACR consensus guidelines: White Paper of the ACR Incidental Findings Committee II on Vascular Findings. J Am Coll Radiol 2013; 10:789-794. 3. Incidental findings, including: Aortic atherosclerosis (ICD10-I70.0), coronary artery atherosclerosis and emphysema (ICD10-J43.9). Electronically Signed   By: Abigail Miyamoto M.D.   On: 01/27/2022 20:49       Assessment & Plan:   Problem List Items  Addressed This Visit     Abdominal aortic aneurysm ([place]) - Primary    Appt AVVS 05/2022 - stable.  Recommended f/u in 12 months.       Anemia    Follow cbc.  Being followed by hematology.       Emphysema of lung ([facility])    Breathing stable.       Esophageal cancer [facility])    Being followed by radiation oncology and oncology.  Eating.  Has received chemo and XRT.  Follow  scheduled for PET scan 08/2022.       GERD (gastroesophageal reflux disease)    Continues on protonix.  No acid reflux reported.       History of lung cancer    Followed by Dr Rogue Bussing.  Stable.        Hypercholesterolemia    Follow lipid panel.       Hyperglycemia    Low carb diet and exercise.  Follow met b and a1c.       Hypertension    Continue benicar/hctz.  Blood pressure has been doing well.   Follow pressures.  Follow metabolic panel.       Low TSH level    Being followed by Dr Forde Dandy.  Thyroid labs just checked and ok.         PAD (peripheral artery disease) (HCC)    Not on cholesterol medication.  Continue blood pressure control.       Type 2 diabetes mellitus with hyperglycemia ([place])    Hold on medication.  Follow met b and a1c.        [name], MD

## 2022-07-14 ENCOUNTER — Ambulatory Visit: Payer: Medicare PPO | Attending: Internal Medicine

## 2022-07-14 DIAGNOSIS — M6281 Muscle weakness (generalized): Secondary | ICD-10-CM | POA: Insufficient documentation

## 2022-07-14 DIAGNOSIS — R262 Difficulty in walking, not elsewhere classified: Secondary | ICD-10-CM | POA: Diagnosis not present

## 2022-07-14 DIAGNOSIS — R29898 Other symptoms and signs involving the musculoskeletal system: Secondary | ICD-10-CM | POA: Insufficient documentation

## 2022-07-14 NOTE — Therapy (Addendum)
OUTPATIENT PHYSICAL THERAPY NEURO TREATMENT   Patient Name: Heather Cooper MRN: [id number] DOB:December 27, 1930, 89 y.o., female Today's Date: 07/14/2022   PCP: [name], MD REFERRING PROVIDER: Charlaine Dalton, MD  END OF SESSION:  PT End of Session - 07/14/22 1012     Visit Number 2    Number of Visits 24    Date for PT Re-Evaluation 10/04/22    PT Start Time 1015    PT Stop Time 1100    PT Time Calculation (min) 45 min    Equipment Utilized During Treatment Gait belt    Activity Tolerance Patient tolerated treatment well    Behavior During Therapy WFL for tasks assessed/performed             Past Medical History:  Diagnosis Date   Chemotherapy induced nausea and vomiting    Chicken pox    Esophageal cancer ([place])    Hypercalcemia    familial hypocalciuric hypercalcemia   Hypercholesterolemia    Hypertension    Lung cancer ([place])    Osteoporosis    Thyroid disease    Past Surgical History:  Procedure Laterality Date   ABDOMINAL HYSTERECTOMY     partial   CHOLECYSTECTOMY     COLONOSCOPY WITH PROPOFOL N/A 04/17/2017   Procedure: COLONOSCOPY WITH PROPOFOL;  Surgeon: Manya Silvas, MD;  Location: [facility] ENDOSCOPY;  Service: Endoscopy;  Laterality: N/A;   ESOPHAGOGASTRODUODENOSCOPY (EGD) WITH PROPOFOL N/A 04/13/2021   Procedure: ESOPHAGOGASTRODUODENOSCOPY (EGD) WITH PROPOFOL;  Surgeon: [name], MD;  Location: [facility] ENDOSCOPY;  Service: Endoscopy;  Laterality: N/A;   EUS N/A 04/22/2021   Procedure: FULL UPPER ENDOSCOPIC ULTRASOUND (EUS) RADIAL;  Surgeon: Jola Schmidt, MD;  Location: [facility] ENDOSCOPY;  Service: Endoscopy;  Laterality: N/A;  [facility] needed   EUS N/A 10/28/2021   Procedure: UPPER ENDOSCOPIC ULTRASOUND (EUS) LINEAR;  Surgeon: Reita Cliche, MD;  Location: [facility] ENDOSCOPY;  Service: Gastroenterology;  Laterality: N/A;  [facility]   transvaginal hysterectomy  04/18/05   with anterior colporrhaphy   Patient Active Problem List   Diagnosis Date Noted    Encounter for immunotherapy 04/26/2022   Low TSH level 11/23/2021   PAD (peripheral artery disease) ([place]) 08/28/2021   Emphysema of lung ([place]) 08/28/2021   Esophageal cancer ([place]) 05/30/2021   Diarrhea 05/30/2021   Neoplasm of middle third of esophagus 05/10/2021   Stricture and stenosis of esophagus    Dysphagia 01/22/2021   Type 2 diabetes mellitus with hyperglycemia ([place]) 01/19/2021   COVID-19 virus infection 06/21/2020   Anemia 05/11/2020   Malignant neoplasm of right upper lobe of lung ([place]) 08/07/2019   URI (upper respiratory infection) 09/16/2016   Health care maintenance 05/18/2016   History of lung cancer 02/08/2016   Abdominal aortic aneurysm ([place]) 08/20/2015   Abnormal CXR 03/06/2014   Cough 03/06/2014   Headache(784.0) 03/06/2014   Hyperglycemia 02/07/2013   Hypertension 06/26/2012   Hypercholesterolemia 06/26/2012   Osteoporosis 06/26/2012   Hypercalcemia 06/26/2012   GERD (gastroesophageal reflux disease) 06/26/2012    ONSET DATE: 9 months, 10/2021  REFERRING DIAG: M62.81 (ICD-10-CM) - Muscle weakness (generalized)   THERAPY DIAG:  Muscle weakness (generalized)  Difficulty in walking, not elsewhere classified  Weakness of both hips  Rationale for Evaluation and Treatment: Rehabilitation  SUBJECTIVE:  SUBJECTIVE STATEMENT:  Pt denies any falls since last visit.  Pt is doing well and ready to continue with therapy.  Pt accompanied by: family member, daughter Delphia Grates  PERTINENT HISTORY:  Pt developed has history of cancer x2, but is in remission.  Pt has been on prednisone for 3 months, but should be off it in the next few days.  MD feels as though that has contributed to her weakness.  Pt has caregiver that comes over 3x week that helps washing clothes due to  having some things on the ground floor.  PAIN:  Are you having pain? No  PRECAUTIONS: Other: Cancer  WEIGHT BEARING RESTRICTIONS: No  FALLS: Has patient fallen in last 6 months? No  LIVING ENVIRONMENT: Lives with: lives alone Lives in: House/apartment Stairs: Yes: Internal: 13 steps; can reach both Has following equipment at home: Single point cane, Walker - 4 wheeled, shower chair, and Grab bars  PLOF: Independent and needing some assistance with driving.  PATIENT GOALS: To get stronger and stop utilizing the walker as much.  OBJECTIVE:   DIAGNOSTIC FINDINGS:   EXAM: NUCLEAR MEDICINE PET SKULL BASE TO THIGH  IMPRESSION: 1. No scintigraphic evidence of residual or recurrent esophageal hypermetabolism. 2. No evidence of hypermetabolic metastatic disease. 3. Similar aneurysmal dilation of the infrarenal abdominal aorta measuring 4.6 cm. Recommend follow-up CT/MR every 6 months and vascular consultation. This recommendation follows ACR consensus guidelines: White Paper of the ACR Incidental Findings Committee II on Vascular Findings. J Am Coll Radiol 2013; 26:712-458. 4. Aortic Atherosclerosis (ICD10-I70.0) and Emphysema (ICD10-J43.9).  COGNITION: Overall cognitive status: Within functional limits for tasks assessed   SENSATION: WFL  COORDINATION: WFL  POSTURE: No Significant postural limitations   LOWER EXTREMITY MMT:    MMT Right Eval Left Eval  Hip flexion 4 4  Hip abduction 4 4  Hip adduction 4- 4-  Knee flexion 4- 4-  Knee extension 4- 4-  Ankle dorsiflexion 4- 4-  (Blank rows = not tested)  FUNCTIONAL TESTS:  5 times sit to stand: 34.89 sec Timed up and go (TUG): 27.56 sec 6 minute walk test: Perform at next session 10 meter walk test: 32.77 sec Berg Balance Scale: 37/56  PATIENT SURVEYS:  FOTO 45/54  TODAY'S TREATMENT: DATE: 07/14/22  6MWT - 506'  Seated hip adduction into rainbow physioball, 3 sec holds, 2x15 Seated LAQ with 2.5# AW  donned, 2x15 Seated marches with 2.5# AW donned, 2x15 Standing hip abduction with 2.5# AW donned, 2x15 Standing hip marches with 2.5# AW donned, 2x15 Standing hip extension with 2.5# AW donned, 2x15 Standing hamstring curls with 2.5# AW donned, 2x15 Standing mini squats with UE support, 2x15 STS x10  HEP, continue with balance and LE strengthening exercises   PATIENT EDUCATION: Education details: Pt educated on role of PT and services provided during current POC, along with prognosis and information about the clinic. Person educated: Patient and Child(ren) Education method: Explanation Education comprehension: verbalized understanding  HOME EXERCISE PROGRAM:  Access Code: [email or web address] URL: [email or web address] Date: 07/14/2022 Prepared by: [place]  Exercises - Supine Bridge  - 1 x daily - 7 x weekly - 3 sets - 10 reps - Clamshell  - 1 x daily - 7 x weekly - 3 sets - 10 reps - Sidelying Hip Abduction  - 1 x daily - 7 x weekly - 3 sets - 10 reps - Supine Posterior Pelvic Tilt  - 1 x daily - 7 x weekly - 3 sets - 10 reps -  Sidelying Reverse Clamshell  - 1 x daily - 7 x weekly - 3 sets - 10 reps - Active Straight Leg Raise with Quad Set  - 1 x daily - 7 x weekly - 3 sets - 10 reps  GOALS: Goals reviewed with patient? Yes  SHORT TERM GOALS: Target date: 08/09/2022  Pt will be independent with HEP in order to demonstrate increased ability to perform tasks related to occupation/hobbies. Baseline: Pt to be given HEP at next visit Goal status: INITIAL  LONG TERM GOALS: Target date: 10/04/2022  1.  Patient (> 30 years old) will complete five times sit to stand test in < 15 seconds indicating an increased LE strength and improved balance. Baseline: 34.89 sec Goal status: INITIAL  2.  Patient will increase FOTO score to equal to or greater than 54 to demonstrate statistically significant improvement in mobility and quality of life.  Baseline: FOTO: 45 Goal status:  INITIAL   3.  Patient will increase Berg Balance score by > 6 points to demonstrate decreased fall risk during functional activities. Baseline: BERG: 37 Goal status: INITIAL   4.  Patient will reduce timed up and go to <11 seconds to reduce fall risk and demonstrate improved transfer/gait ability. Baseline: 27.56 sec Goal status: INITIAL  5.  Patient will increase 10 meter walk test to >1.6m/s as to improve gait speed for better community ambulation and to reduce fall risk. Baseline: 32.77 sec Goal status: INITIAL  6.  Patient will increase six minute walk test distance to >1000 for progression to community ambulator and improve gait ability Baseline: Perform at subsequent visit Goal status: INITIAL    ASSESSMENT:  CLINICAL IMPRESSION:  Pt performed well and put forth great effort throughout the session.  Pt still exhibits increased fear of falling and decreased step length when ambulating, but overall doing well.  Pt notes that the exercises at home have been going well.  Pt to continue to benefit from the exercises and was given HEP today for more supine exercises to do when she wakes and when she goes to bed.  Pt agreeable.   Pt will continue to benefit from skilled therapy to address remaining deficits in order to improve overall QoL and return to PLOF.       OBJECTIVE IMPAIRMENTS: Abnormal gait, decreased activity tolerance, decreased balance, decreased endurance, decreased knowledge of use of DME, decreased mobility, difficulty walking, and decreased strength.   ACTIVITY LIMITATIONS: carrying, lifting, standing, squatting, stairs, transfers, bed mobility, continence, bathing, toileting, dressing, and locomotion level  PARTICIPATION LIMITATIONS: cleaning, laundry, driving, shopping, community activity, yard work, and church  PERSONAL FACTORS: Age, Fitness, Past/current experiences, and Time since onset of injury/illness/exacerbation are also affecting patient's functional  outcome.   REHAB POTENTIAL: Good  CLINICAL DECISION MAKING: Stable/uncomplicated  EVALUATION COMPLEXITY: Low  PLAN:  PT FREQUENCY: 2x/week  PT DURATION: 12 weeks  PLANNED INTERVENTIONS: Therapeutic exercises, Therapeutic activity, Neuromuscular re-education, Balance training, Gait training, Patient/Family education, Self Care, Joint mobilization, Stair training, Vestibular training, Canalith repositioning, DME instructions, Aquatic Therapy, Dry Needling, Cryotherapy, Moist heat, Taping, Manual therapy, and Re-evaluation  PLAN FOR NEXT SESSION:  6MWT, HEP, continue with balance and LE strengthening exercises    Gwenlyn Saran, PT, DPT Physical Therapist - [facility]  07/14/22, 12:58 PM

## 2022-07-16 ENCOUNTER — Encounter: Payer: Self-pay | Admitting: Internal Medicine

## 2022-07-16 NOTE — Assessment & Plan Note (Signed)
Not on cholesterol medication.  Continue blood pressure control.

## 2022-07-16 NOTE — Assessment & Plan Note (Signed)
Low carb diet and exercise.  Follow met b and a1c.   

## 2022-07-16 NOTE — Assessment & Plan Note (Signed)
Continues on protonix.  No acid reflux reported.

## 2022-07-16 NOTE — Assessment & Plan Note (Signed)
Follow cbc.  Being followed by hematology.

## 2022-07-16 NOTE — Assessment & Plan Note (Signed)
Appt [facility] 05/2022 - stable.  Recommended f/u in 12 months.

## 2022-07-16 NOTE — Assessment & Plan Note (Signed)
Hold on medication.  Follow met b and a1c.

## 2022-07-16 NOTE — Assessment & Plan Note (Signed)
Continue benicar/hctz.  Blood pressure has been doing well.   Follow pressures.  Follow metabolic panel.

## 2022-07-16 NOTE — Assessment & Plan Note (Signed)
Breathing stable.

## 2022-07-16 NOTE — Assessment & Plan Note (Signed)
Follow lipid panel.   

## 2022-07-16 NOTE — Assessment & Plan Note (Signed)
Followed by Dr Rogue Bussing.  Stable.

## 2022-07-16 NOTE — Assessment & Plan Note (Signed)
Being followed by Dr Forde Dandy.  Thyroid labs just checked and ok.

## 2022-07-16 NOTE — Assessment & Plan Note (Signed)
Being followed by radiation oncology and oncology.  Eating.  Has received chemo and XRT.  Follow  scheduled for PET scan 08/2022.

## 2022-07-18 ENCOUNTER — Ambulatory Visit: Payer: Medicare PPO

## 2022-07-18 DIAGNOSIS — M6281 Muscle weakness (generalized): Secondary | ICD-10-CM | POA: Diagnosis not present

## 2022-07-18 DIAGNOSIS — R262 Difficulty in walking, not elsewhere classified: Secondary | ICD-10-CM | POA: Diagnosis not present

## 2022-07-18 DIAGNOSIS — R29898 Other symptoms and signs involving the musculoskeletal system: Secondary | ICD-10-CM

## 2022-07-18 NOTE — Therapy (Signed)
OUTPATIENT PHYSICAL THERAPY NEURO TREATMENT   Patient Name: Heather Cooper MRN: [id number] DOB:May 29, 1931, 89 y.o., female Today's Date: 07/18/2022   PCP: [name], MD REFERRING PROVIDER: Charlaine Dalton, MD  END OF SESSION:  PT End of Session - 07/18/22 1017     Visit Number 3    Number of Visits 24    Date for PT Re-Evaluation 10/04/22    PT Start Time 1016    PT Stop Time 1100    PT Time Calculation (min) 44 min    Equipment Utilized During Treatment Gait belt    Activity Tolerance Patient tolerated treatment well    Behavior During Therapy WFL for tasks assessed/performed              Past Medical History:  Diagnosis Date   Chemotherapy induced nausea and vomiting    Chicken pox    Esophageal cancer ([place])    Hypercalcemia    familial hypocalciuric hypercalcemia   Hypercholesterolemia    Hypertension    Lung cancer ([place])    Osteoporosis    Thyroid disease    Past Surgical History:  Procedure Laterality Date   ABDOMINAL HYSTERECTOMY     partial   CHOLECYSTECTOMY     COLONOSCOPY WITH PROPOFOL N/A 04/17/2017   Procedure: COLONOSCOPY WITH PROPOFOL;  Surgeon: Manya Silvas, MD;  Location: [facility] ENDOSCOPY;  Service: Endoscopy;  Laterality: N/A;   ESOPHAGOGASTRODUODENOSCOPY (EGD) WITH PROPOFOL N/A 04/13/2021   Procedure: ESOPHAGOGASTRODUODENOSCOPY (EGD) WITH PROPOFOL;  Surgeon: [name], MD;  Location: [facility] ENDOSCOPY;  Service: Endoscopy;  Laterality: N/A;   EUS N/A 04/22/2021   Procedure: FULL UPPER ENDOSCOPIC ULTRASOUND (EUS) RADIAL;  Surgeon: Jola Schmidt, MD;  Location: [facility] ENDOSCOPY;  Service: Endoscopy;  Laterality: N/A;  [facility] needed   EUS N/A 10/28/2021   Procedure: UPPER ENDOSCOPIC ULTRASOUND (EUS) LINEAR;  Surgeon: Reita Cliche, MD;  Location: [facility] ENDOSCOPY;  Service: Gastroenterology;  Laterality: N/A;  [facility]   transvaginal hysterectomy  04/18/05   with anterior colporrhaphy   Patient Active Problem List   Diagnosis Date  Noted   Encounter for immunotherapy 04/26/2022   Low TSH level 11/23/2021   PAD (peripheral artery disease) ([place]) 08/28/2021   Emphysema of lung ([place]) 08/28/2021   Esophageal cancer ([place]) 05/30/2021   Diarrhea 05/30/2021   Neoplasm of middle third of esophagus 05/10/2021   Stricture and stenosis of esophagus    Dysphagia 01/22/2021   Type 2 diabetes mellitus with hyperglycemia ([place]) 01/19/2021   COVID-19 virus infection 06/21/2020   Anemia 05/11/2020   Malignant neoplasm of right upper lobe of lung ([place]) 08/07/2019   URI (upper respiratory infection) 09/16/2016   Health care maintenance 05/18/2016   History of lung cancer 02/08/2016   Abdominal aortic aneurysm ([place]) 08/20/2015   Abnormal CXR 03/06/2014   Cough 03/06/2014   Headache(784.0) 03/06/2014   Hyperglycemia 02/07/2013   Hypertension 06/26/2012   Hypercholesterolemia 06/26/2012   Osteoporosis 06/26/2012   Hypercalcemia 06/26/2012   GERD (gastroesophageal reflux disease) 06/26/2012    ONSET DATE: 9 months, 10/2021  REFERRING DIAG: M62.81 (ICD-10-CM) - Muscle weakness (generalized)   THERAPY DIAG:  Muscle weakness (generalized)  Difficulty in walking, not elsewhere classified  Weakness of both hips  Rationale for Evaluation and Treatment: Rehabilitation  SUBJECTIVE:  SUBJECTIVE STATEMENT:  Pt reports she did her exercises over the weekend and had a good weekend.  Pt denies any falls as well.   Pt accompanied by: family member, daughter Delphia Grates  PERTINENT HISTORY:  Pt developed has history of cancer x2, but is in remission.  Pt has been on prednisone for 3 months, but should be off it in the next few days.  MD feels as though that has contributed to her weakness.  Pt has caregiver that comes over 3x week that helps  washing clothes due to having some things on the ground floor.  PAIN:  Are you having pain? No  PRECAUTIONS: Other: Cancer  WEIGHT BEARING RESTRICTIONS: No  FALLS: Has patient fallen in last 6 months? No  LIVING ENVIRONMENT: Lives with: lives alone Lives in: House/apartment Stairs: Yes: Internal: 13 steps; can reach both Has following equipment at home: Single point cane, Walker - 4 wheeled, shower chair, and Grab bars  PLOF: Independent and needing some assistance with driving.  PATIENT GOALS: To get stronger and stop utilizing the walker as much.  OBJECTIVE:   DIAGNOSTIC FINDINGS:   EXAM: NUCLEAR MEDICINE PET SKULL BASE TO THIGH  IMPRESSION: 1. No scintigraphic evidence of residual or recurrent esophageal hypermetabolism. 2. No evidence of hypermetabolic metastatic disease. 3. Similar aneurysmal dilation of the infrarenal abdominal aorta measuring 4.6 cm. Recommend follow-up CT/MR every 6 months and vascular consultation. This recommendation follows ACR consensus guidelines: White Paper of the ACR Incidental Findings Committee II on Vascular Findings. J Am Coll Radiol 2013; 61:950-932. 4. Aortic Atherosclerosis (ICD10-I70.0) and Emphysema (ICD10-J43.9).  COGNITION: Overall cognitive status: Within functional limits for tasks assessed   SENSATION: WFL  COORDINATION: WFL  POSTURE: No Significant postural limitations   LOWER EXTREMITY MMT:    MMT Right Eval Left Eval  Hip flexion 4 4  Hip abduction 4 4  Hip adduction 4- 4-  Knee flexion 4- 4-  Knee extension 4- 4-  Ankle dorsiflexion 4- 4-  (Blank rows = not tested)  FUNCTIONAL TESTS:  5 times sit to stand: 34.89 sec Timed up and go (TUG): 27.56 sec 6 minute walk test: Perform at next session 10 meter walk test: 32.77 sec Berg Balance Scale: 37/56  PATIENT SURVEYS:  FOTO 45/54  TODAY'S TREATMENT: DATE: 07/18/22   TherEx:  Seated hip adduction into rainbow physioball, 3 sec holds,  2x15 Seated LAQ with 3# AW donned, 2x15 Seated marches with 3# AW donned, 2x15 Standing hip abduction with 3# AW donned, 2x15 Standing hip marches with 3# AW donned, 2x15 Standing hip extension with 3# AW donned, 2x15 Standing hamstring curls with 3# AW donned, 2x15 Standing mini squats with UE support, 2x15 STS x10 Ambulation around the gym x2 with 3# AW donned     PATIENT EDUCATION: Education details: Pt educated on role of PT and services provided during current POC, along with prognosis and information about the clinic. Person educated: Patient and Child(ren) Education method: Explanation Education comprehension: verbalized understanding  HOME EXERCISE PROGRAM:  Access Code: [email or web address] URL: [email or web address] Date: 07/14/2022 Prepared by: [place]  Exercises - Supine Bridge  - 1 x daily - 7 x weekly - 3 sets - 10 reps - Clamshell  - 1 x daily - 7 x weekly - 3 sets - 10 reps - Sidelying Hip Abduction  - 1 x daily - 7 x weekly - 3 sets - 10 reps - Supine Posterior Pelvic Tilt  - 1 x daily - 7 x weekly - 3  sets - 10 reps - Sidelying Reverse Clamshell  - 1 x daily - 7 x weekly - 3 sets - 10 reps - Active Straight Leg Raise with Quad Set  - 1 x daily - 7 x weekly - 3 sets - 10 reps  GOALS: Goals reviewed with patient? Yes  SHORT TERM GOALS: Target date: 08/09/2022  Pt will be independent with HEP in order to demonstrate increased ability to perform tasks related to occupation/hobbies. Baseline: Pt to be given HEP at next visit Goal status: INITIAL  LONG TERM GOALS: Target date: 10/04/2022  1.  Patient (> 53 years old) will complete five times sit to stand test in < 15 seconds indicating an increased LE strength and improved balance. Baseline: 34.89 sec Goal status: INITIAL  2.  Patient will increase FOTO score to equal to or greater than 54 to demonstrate statistically significant improvement in mobility and quality of life.  Baseline: FOTO: 45 Goal  status: INITIAL   3.  Patient will increase Berg Balance score by > 6 points to demonstrate decreased fall risk during functional activities. Baseline: BERG: 37 Goal status: INITIAL   4.  Patient will reduce timed up and go to <11 seconds to reduce fall risk and demonstrate improved transfer/gait ability. Baseline: 27.56 sec Goal status: INITIAL  5.  Patient will increase 10 meter walk test to >1.38m/s as to improve gait speed for better community ambulation and to reduce fall risk. Baseline: 32.77 sec Goal status: INITIAL  6.  Patient will increase six minute walk test distance to >1000 for progression to community ambulator and improve gait ability Baseline: Perform at subsequent visit Goal status: INITIAL    ASSESSMENT:  CLINICAL IMPRESSION:  Pt performed well with all the tasks given to him this session and put forth great effort throughout the exercises.  Pt currently displays weakness in the L LE when ambulating and will be focal point in future sessions.  Pt to benefit from increased step length and to prevent shuffling of gait.  Pt has been making good progress with HEP and will continue to benefit from HEP.   Pt will continue to benefit from skilled therapy to address remaining deficits in order to improve overall QoL and return to PLOF.       OBJECTIVE IMPAIRMENTS: Abnormal gait, decreased activity tolerance, decreased balance, decreased endurance, decreased knowledge of use of DME, decreased mobility, difficulty walking, and decreased strength.   ACTIVITY LIMITATIONS: carrying, lifting, standing, squatting, stairs, transfers, bed mobility, continence, bathing, toileting, dressing, and locomotion level  PARTICIPATION LIMITATIONS: cleaning, laundry, driving, shopping, community activity, yard work, and church  PERSONAL FACTORS: Age, Fitness, Past/current experiences, and Time since onset of injury/illness/exacerbation are also affecting patient's functional outcome.    REHAB POTENTIAL: Good  CLINICAL DECISION MAKING: Stable/uncomplicated  EVALUATION COMPLEXITY: Low  PLAN:  PT FREQUENCY: 2x/week  PT DURATION: 12 weeks  PLANNED INTERVENTIONS: Therapeutic exercises, Therapeutic activity, Neuromuscular re-education, Balance training, Gait training, Patient/Family education, Self Care, Joint mobilization, Stair training, Vestibular training, Canalith repositioning, DME instructions, Aquatic Therapy, Dry Needling, Cryotherapy, Moist heat, Taping, Manual therapy, and Re-evaluation  PLAN FOR NEXT SESSION:   6MWT, HEP, continue with balance and LE strengthening exercises    Gwenlyn Saran, PT, DPT Physical Therapist - [facility]  07/18/22, 3:09 PM

## 2022-07-19 ENCOUNTER — Ambulatory Visit: Payer: Medicare PPO

## 2022-07-22 ENCOUNTER — Ambulatory Visit: Payer: Medicare PPO

## 2022-07-22 DIAGNOSIS — R29898 Other symptoms and signs involving the musculoskeletal system: Secondary | ICD-10-CM | POA: Diagnosis not present

## 2022-07-22 DIAGNOSIS — R262 Difficulty in walking, not elsewhere classified: Secondary | ICD-10-CM | POA: Diagnosis not present

## 2022-07-22 DIAGNOSIS — M6281 Muscle weakness (generalized): Secondary | ICD-10-CM | POA: Diagnosis not present

## 2022-07-22 NOTE — Therapy (Signed)
OUTPATIENT PHYSICAL THERAPY NEURO TREATMENT   Patient Name: Heather Cooper MRN: [id number] DOB:May 17, 1931, 89 y.o., female Today's Date: 07/22/2022   PCP: [name], MD REFERRING PROVIDER: Charlaine Dalton, MD  END OF SESSION:  PT End of Session - 07/22/22 1010     Visit Number 4    Number of Visits 24    Date for PT Re-Evaluation 10/04/22    PT Start Time 1010    PT Stop Time 1055    PT Time Calculation (min) 45 min    Equipment Utilized During Treatment Gait belt    Activity Tolerance Patient tolerated treatment well    Behavior During Therapy WFL for tasks assessed/performed               Past Medical History:  Diagnosis Date   Chemotherapy induced nausea and vomiting    Chicken pox    Esophageal cancer ([place])    Hypercalcemia    familial hypocalciuric hypercalcemia   Hypercholesterolemia    Hypertension    Lung cancer ([place])    Osteoporosis    Thyroid disease    Past Surgical History:  Procedure Laterality Date   ABDOMINAL HYSTERECTOMY     partial   CHOLECYSTECTOMY     COLONOSCOPY WITH PROPOFOL N/A 04/17/2017   Procedure: COLONOSCOPY WITH PROPOFOL;  Surgeon: Manya Silvas, MD;  Location: [facility] ENDOSCOPY;  Service: Endoscopy;  Laterality: N/A;   ESOPHAGOGASTRODUODENOSCOPY (EGD) WITH PROPOFOL N/A 04/13/2021   Procedure: ESOPHAGOGASTRODUODENOSCOPY (EGD) WITH PROPOFOL;  Surgeon: [name], MD;  Location: [facility] ENDOSCOPY;  Service: Endoscopy;  Laterality: N/A;   EUS N/A 04/22/2021   Procedure: FULL UPPER ENDOSCOPIC ULTRASOUND (EUS) RADIAL;  Surgeon: Jola Schmidt, MD;  Location: [facility] ENDOSCOPY;  Service: Endoscopy;  Laterality: N/A;  [facility] needed   EUS N/A 10/28/2021   Procedure: UPPER ENDOSCOPIC ULTRASOUND (EUS) LINEAR;  Surgeon: Reita Cliche, MD;  Location: [facility] ENDOSCOPY;  Service: Gastroenterology;  Laterality: N/A;  [facility]   transvaginal hysterectomy  04/18/05   with anterior colporrhaphy   Patient Active Problem List   Diagnosis Date  Noted   Encounter for immunotherapy 04/26/2022   Low TSH level 11/23/2021   PAD (peripheral artery disease) ([place]) 08/28/2021   Emphysema of lung ([place]) 08/28/2021   Esophageal cancer ([place]) 05/30/2021   Diarrhea 05/30/2021   Neoplasm of middle third of esophagus 05/10/2021   Stricture and stenosis of esophagus    Dysphagia 01/22/2021   Type 2 diabetes mellitus with hyperglycemia ([place]) 01/19/2021   COVID-19 virus infection 06/21/2020   Anemia 05/11/2020   Malignant neoplasm of right upper lobe of lung ([place]) 08/07/2019   URI (upper respiratory infection) 09/16/2016   Health care maintenance 05/18/2016   History of lung cancer 02/08/2016   Abdominal aortic aneurysm ([place]) 08/20/2015   Abnormal CXR 03/06/2014   Cough 03/06/2014   Headache(784.0) 03/06/2014   Hyperglycemia 02/07/2013   Hypertension 06/26/2012   Hypercholesterolemia 06/26/2012   Osteoporosis 06/26/2012   Hypercalcemia 06/26/2012   GERD (gastroesophageal reflux disease) 06/26/2012    ONSET DATE: 9 months, 10/2021  REFERRING DIAG: M62.81 (ICD-10-CM) - Muscle weakness (generalized)   THERAPY DIAG:  Muscle weakness (generalized)  Difficulty in walking, not elsewhere classified  Weakness of both hips  Rationale for Evaluation and Treatment: Rehabilitation  SUBJECTIVE:  SUBJECTIVE STATEMENT:  Pt reports she has been feeling good and feels as though her legs are getting stronger.     Pt accompanied by: family member, son, Butch  PERTINENT HISTORY:  Pt developed has history of cancer x2, but is in remission.  Pt has been on prednisone for 3 months, but should be off it in the next few days.  MD feels as though that has contributed to her weakness.  Pt has caregiver that comes over 3x week that helps washing clothes due to having  some things on the ground floor.  PAIN:  Are you having pain? No  PRECAUTIONS: Other: Cancer  WEIGHT BEARING RESTRICTIONS: No  FALLS: Has patient fallen in last 6 months? No  LIVING ENVIRONMENT: Lives with: lives alone Lives in: House/apartment Stairs: Yes: Internal: 13 steps; can reach both Has following equipment at home: Single point cane, Walker - 4 wheeled, shower chair, and Grab bars  PLOF: Independent and needing some assistance with driving.  PATIENT GOALS: To get stronger and stop utilizing the walker as much.  OBJECTIVE:   DIAGNOSTIC FINDINGS:   EXAM: NUCLEAR MEDICINE PET SKULL BASE TO THIGH  IMPRESSION: 1. No scintigraphic evidence of residual or recurrent esophageal hypermetabolism. 2. No evidence of hypermetabolic metastatic disease. 3. Similar aneurysmal dilation of the infrarenal abdominal aorta measuring 4.6 cm. Recommend follow-up CT/MR every 6 months and vascular consultation. This recommendation follows ACR consensus guidelines: White Paper of the ACR Incidental Findings Committee II on Vascular Findings. J Am Coll Radiol 2013; 25:427-062. 4. Aortic Atherosclerosis (ICD10-I70.0) and Emphysema (ICD10-J43.9).  COGNITION: Overall cognitive status: Within functional limits for tasks assessed   SENSATION: WFL  COORDINATION: WFL  POSTURE: No Significant postural limitations   LOWER EXTREMITY MMT:    MMT Right Eval Left Eval  Hip flexion 4 4  Hip abduction 4 4  Hip adduction 4- 4-  Knee flexion 4- 4-  Knee extension 4- 4-  Ankle dorsiflexion 4- 4-  (Blank rows = not tested)  FUNCTIONAL TESTS:  5 times sit to stand: 34.89 sec Timed up and go (TUG): 27.56 sec 6 minute walk test: Perform at next session 10 meter walk test: 32.77 sec Berg Balance Scale: 37/56  PATIENT SURVEYS:  FOTO 45/54  TODAY'S TREATMENT: DATE: 07/22/22   TherEx:  Seated hip adduction into rainbow physioball, 3 sec holds, 2x15 Seated LAQ with 3# AW donned,  2x15 Seated marches with 3# AW donned, 2x15 Static stance on incline board for improved muscle recruitment necessary for stability in standing, 60 secs Standing heel raises while standing on incline board, 2x15 Standing mini squats with UE support, 2x15 STS 2x10 with rainbow physioball placed between knees for wider stance   Gait Training:  Ambulation around gym with use of rollator with laser for visual feedback for longer step length, x3 laps Ambulation around gym with personal rollator for carryover of longer step length, x3 laps Ambulation around gym with increased focus on heel-to-toe gait pattern, x2 laps      PATIENT EDUCATION: Education details: Pt educated on role of PT and services provided during current POC, along with prognosis and information about the clinic. Person educated: Patient and Child(ren) Education method: Explanation Education comprehension: verbalized understanding  HOME EXERCISE PROGRAM:  Access Code: [email or web address] URL: [email or web address] Date: 07/14/2022 Prepared by: Monette Nation  Exercises - Supine Bridge  - 1 x daily - 7 x weekly - 3 sets - 10 reps - Clamshell  - 1 x daily - 7 x weekly - 3  sets - 10 reps - Sidelying Hip Abduction  - 1 x daily - 7 x weekly - 3 sets - 10 reps - Supine Posterior Pelvic Tilt  - 1 x daily - 7 x weekly - 3 sets - 10 reps - Sidelying Reverse Clamshell  - 1 x daily - 7 x weekly - 3 sets - 10 reps - Active Straight Leg Raise with Quad Set  - 1 x daily - 7 x weekly - 3 sets - 10 reps  GOALS: Goals reviewed with patient? Yes  SHORT TERM GOALS: Target date: 08/09/2022  Pt will be independent with HEP in order to demonstrate increased ability to perform tasks related to occupation/hobbies. Baseline: Pt to be given HEP at next visit Goal status: INITIAL  LONG TERM GOALS: Target date: 10/04/2022  1.  Patient (> 14 years old) will complete five times sit to stand test in < 15 seconds indicating an increased LE  strength and improved balance. Baseline: 34.89 sec Goal status: INITIAL  2.  Patient will increase FOTO score to equal to or greater than 54 to demonstrate statistically significant improvement in mobility and quality of life.  Baseline: FOTO: 45 Goal status: INITIAL   3.  Patient will increase Berg Balance score by > 6 points to demonstrate decreased fall risk during functional activities. Baseline: BERG: 37 Goal status: INITIAL   4.  Patient will reduce timed up and go to <11 seconds to reduce fall risk and demonstrate improved transfer/gait ability. Baseline: 27.56 sec Goal status: INITIAL  5.  Patient will increase 10 meter walk test to >1.71m/s as to improve gait speed for better community ambulation and to reduce fall risk. Baseline: 32.77 sec Goal status: INITIAL  6.  Patient will increase six minute walk test distance to >1000 for progression to community ambulator and improve gait ability Baseline: Perform at subsequent visit Goal status: INITIAL    ASSESSMENT:  CLINICAL IMPRESSION:  Pt responded well to the exercises given and is making significant improvement with her gait following the gait training.  Pt able to increase her step length with good carryover when not given visual cuing and translates it well over the the use of her personal rollator.  Pt does still have some difficulty with heel strike on the L LE, and will continue to benefit from exercises that target that area going forward in therapy.   Pt will continue to benefit from skilled therapy to address remaining deficits in order to improve overall QoL and return to PLOF.        OBJECTIVE IMPAIRMENTS: Abnormal gait, decreased activity tolerance, decreased balance, decreased endurance, decreased knowledge of use of DME, decreased mobility, difficulty walking, and decreased strength.   ACTIVITY LIMITATIONS: carrying, lifting, standing, squatting, stairs, transfers, bed mobility, continence, bathing,  toileting, dressing, and locomotion level  PARTICIPATION LIMITATIONS: cleaning, laundry, driving, shopping, community activity, yard work, and church  PERSONAL FACTORS: Age, Fitness, Past/current experiences, and Time since onset of injury/illness/exacerbation are also affecting patient's functional outcome.   REHAB POTENTIAL: Good  CLINICAL DECISION MAKING: Stable/uncomplicated  EVALUATION COMPLEXITY: Low  PLAN:  PT FREQUENCY: 2x/week  PT DURATION: 12 weeks  PLANNED INTERVENTIONS: Therapeutic exercises, Therapeutic activity, Neuromuscular re-education, Balance training, Gait training, Patient/Family education, Self Care, Joint mobilization, Stair training, Vestibular training, Canalith repositioning, DME instructions, Aquatic Therapy, Dry Needling, Cryotherapy, Moist heat, Taping, Manual therapy, and Re-evaluation  PLAN FOR NEXT SESSION:   6MWT, HEP, continue with balance and LE strengthening exercises    Gwenlyn Saran,  PT, DPT Physical Therapist - [facility]  07/22/22, 11:48 AM

## 2022-07-26 ENCOUNTER — Ambulatory Visit: Payer: Medicare PPO

## 2022-07-26 DIAGNOSIS — M6281 Muscle weakness (generalized): Secondary | ICD-10-CM

## 2022-07-26 DIAGNOSIS — R29898 Other symptoms and signs involving the musculoskeletal system: Secondary | ICD-10-CM | POA: Diagnosis not present

## 2022-07-26 DIAGNOSIS — R262 Difficulty in walking, not elsewhere classified: Secondary | ICD-10-CM | POA: Diagnosis not present

## 2022-07-26 NOTE — Therapy (Signed)
OUTPATIENT PHYSICAL THERAPY NEURO TREATMENT   Patient Name: Heather Cooper MRN: [id number] DOB:01-29-1931, 89 y.o., female Today's Date: 07/26/2022   PCP: [name], MD REFERRING PROVIDER: Charlaine Dalton, MD  END OF SESSION:  PT End of Session - 07/26/22 1153     Visit Number 5    Number of Visits 24    Date for PT Re-Evaluation 10/04/22    PT Start Time 1149    PT Stop Time 1230    PT Time Calculation (min) 41 min    Equipment Utilized During Treatment Gait belt    Activity Tolerance Patient tolerated treatment well    Behavior During Therapy WFL for tasks assessed/performed               Past Medical History:  Diagnosis Date   Chemotherapy induced nausea and vomiting    Chicken pox    Esophageal cancer ([place])    Hypercalcemia    familial hypocalciuric hypercalcemia   Hypercholesterolemia    Hypertension    Lung cancer ([place])    Osteoporosis    Thyroid disease    Past Surgical History:  Procedure Laterality Date   ABDOMINAL HYSTERECTOMY     partial   CHOLECYSTECTOMY     COLONOSCOPY WITH PROPOFOL N/A 04/17/2017   Procedure: COLONOSCOPY WITH PROPOFOL;  Surgeon: Manya Silvas, MD;  Location: [facility] ENDOSCOPY;  Service: Endoscopy;  Laterality: N/A;   ESOPHAGOGASTRODUODENOSCOPY (EGD) WITH PROPOFOL N/A 04/13/2021   Procedure: ESOPHAGOGASTRODUODENOSCOPY (EGD) WITH PROPOFOL;  Surgeon: [name], MD;  Location: [facility] ENDOSCOPY;  Service: Endoscopy;  Laterality: N/A;   EUS N/A 04/22/2021   Procedure: FULL UPPER ENDOSCOPIC ULTRASOUND (EUS) RADIAL;  Surgeon: Jola Schmidt, MD;  Location: [facility] ENDOSCOPY;  Service: Endoscopy;  Laterality: N/A;  [facility] needed   EUS N/A 10/28/2021   Procedure: UPPER ENDOSCOPIC ULTRASOUND (EUS) LINEAR;  Surgeon: Reita Cliche, MD;  Location: [facility] ENDOSCOPY;  Service: Gastroenterology;  Laterality: N/A;  [facility]   transvaginal hysterectomy  04/18/05   with anterior colporrhaphy   Patient Active Problem List   Diagnosis Date  Noted   Encounter for immunotherapy 04/26/2022   Low TSH level 11/23/2021   PAD (peripheral artery disease) ([place]) 08/28/2021   Emphysema of lung ([place]) 08/28/2021   Esophageal cancer ([place]) 05/30/2021   Diarrhea 05/30/2021   Neoplasm of middle third of esophagus 05/10/2021   Stricture and stenosis of esophagus    Dysphagia 01/22/2021   Type 2 diabetes mellitus with hyperglycemia ([place]) 01/19/2021   COVID-19 virus infection 06/21/2020   Anemia 05/11/2020   Malignant neoplasm of right upper lobe of lung ([place]) 08/07/2019   URI (upper respiratory infection) 09/16/2016   Health care maintenance 05/18/2016   History of lung cancer 02/08/2016   Abdominal aortic aneurysm ([place]) 08/20/2015   Abnormal CXR 03/06/2014   Cough 03/06/2014   Headache(784.0) 03/06/2014   Hyperglycemia 02/07/2013   Hypertension 06/26/2012   Hypercholesterolemia 06/26/2012   Osteoporosis 06/26/2012   Hypercalcemia 06/26/2012   GERD (gastroesophageal reflux disease) 06/26/2012    ONSET DATE: 9 months, 10/2021  REFERRING DIAG: M62.81 (ICD-10-CM) - Muscle weakness (generalized)   THERAPY DIAG:  Muscle weakness (generalized)  Difficulty in walking, not elsewhere classified  Weakness of both hips  Rationale for Evaluation and Treatment: Rehabilitation  SUBJECTIVE:  SUBJECTIVE STATEMENT:  Pt reports she has been feeling good and feels as though her legs are getting stronger.     Pt accompanied by: family member, son, Butch  PERTINENT HISTORY:  Pt developed has history of cancer x2, but is in remission.  Pt has been on prednisone for 3 months, but should be off it in the next few days.  MD feels as though that has contributed to her weakness.  Pt has caregiver that comes over 3x week that helps washing clothes due to having  some things on the ground floor.  PAIN:  Are you having pain? No  PRECAUTIONS: Other: Cancer  WEIGHT BEARING RESTRICTIONS: No  FALLS: Has patient fallen in last 6 months? No  LIVING ENVIRONMENT: Lives with: lives alone Lives in: House/apartment Stairs: Yes: Internal: 13 steps; can reach both Has following equipment at home: Single point cane, Walker - 4 wheeled, shower chair, and Grab bars  PLOF: Independent and needing some assistance with driving.  PATIENT GOALS: To get stronger and stop utilizing the walker as much.  OBJECTIVE:   DIAGNOSTIC FINDINGS:   EXAM: NUCLEAR MEDICINE PET SKULL BASE TO THIGH  IMPRESSION: 1. No scintigraphic evidence of residual or recurrent esophageal hypermetabolism. 2. No evidence of hypermetabolic metastatic disease. 3. Similar aneurysmal dilation of the infrarenal abdominal aorta measuring 4.6 cm. Recommend follow-up CT/MR every 6 months and vascular consultation. This recommendation follows ACR consensus guidelines: White Paper of the ACR Incidental Findings Committee II on Vascular Findings. J Am Coll Radiol 2013; 79:024-097. 4. Aortic Atherosclerosis (ICD10-I70.0) and Emphysema (ICD10-J43.9).  COGNITION: Overall cognitive status: Within functional limits for tasks assessed   SENSATION: WFL  COORDINATION: WFL  POSTURE: No Significant postural limitations   LOWER EXTREMITY MMT:    MMT Right Eval Left Eval  Hip flexion 4 4  Hip abduction 4 4  Hip adduction 4- 4-  Knee flexion 4- 4-  Knee extension 4- 4-  Ankle dorsiflexion 4- 4-  (Blank rows = not tested)  FUNCTIONAL TESTS:  5 times sit to stand: 34.89 sec Timed up and go (TUG): 27.56 sec 6 minute walk test: Perform at next session 10 meter walk test: 32.77 sec Berg Balance Scale: 37/56  PATIENT SURVEYS:  FOTO 45/54  TODAY'S TREATMENT: DATE: 07/26/22   TherEx:  Seated hip adduction into rainbow physioball, 3 sec holds, 2x15 Seated LAQ with 4# AW donned,  2x15 Seated marches with 4# AW donned, 2x15 Seated heel raises while wearing 4# AW, 2x15 STS 2x10 with rainbow physioball placed between knees for wider stance   Gait Training:   Ambulation with use of SPC and verbal and visual cuing for increased step length and heel-to-toe gait pattern, x3 laps around the gym (148' x3)   Neuro:  Activity Description: Random tapping with BLE Activity Setting:  Random Number of Pods:  6 Cycles/Sets:  1 Duration (Time or Hit Count):  30 hits  Patient Stats  Reaction Time:   2,759  Activity Description: Random tapping with BLE Activity Setting:  Random Number of Pods:  6 Cycles/Sets:  2 Duration (Time or Hit Count):  20 hits  Patient Stats  Reaction Time:   2,091 2,464     PATIENT EDUCATION: Education details: Pt educated on role of PT and services provided during current POC, along with prognosis and information about the clinic. Person educated: Patient and Child(ren) Education method: Explanation Education comprehension: verbalized understanding  HOME EXERCISE PROGRAM:  Access Code: [email or web address] URL: [email or web address] Date: 07/14/2022 Prepared by: [place] Nation  Exercises - Supine Bridge  - 1 x daily - 7 x weekly - 3 sets - 10 reps - Clamshell  - 1 x daily - 7 x weekly - 3 sets - 10 reps - Sidelying Hip Abduction  - 1 x daily - 7 x weekly - 3 sets - 10 reps - Supine Posterior Pelvic Tilt  - 1 x daily - 7 x weekly - 3 sets - 10 reps - Sidelying Reverse Clamshell  - 1 x daily - 7 x weekly - 3 sets - 10 reps - Active Straight Leg Raise with Quad Set  - 1 x daily - 7 x weekly - 3 sets - 10 reps  GOALS: Goals reviewed with patient? Yes  SHORT TERM GOALS: Target date: 08/09/2022  Pt will be independent with HEP in order to demonstrate increased ability to perform tasks related to occupation/hobbies. Baseline: Pt to be given HEP at next visit Goal status: INITIAL  LONG TERM GOALS: Target date: 10/04/2022  1.   Patient (> 59 years old) will complete five times sit to stand test in < 15 seconds indicating an increased LE strength and improved balance. Baseline: 34.89 sec Goal status: INITIAL  2.  Patient will increase FOTO score to equal to or greater than 54 to demonstrate statistically significant improvement in mobility and quality of life.  Baseline: FOTO: 45 Goal status: INITIAL   3.  Patient will increase Berg Balance score by > 6 points to demonstrate decreased fall risk during functional activities. Baseline: BERG: 37 Goal status: INITIAL   4.  Patient will reduce timed up and go to <11 seconds to reduce fall risk and demonstrate improved transfer/gait ability. Baseline: 27.56 sec Goal status: INITIAL  5.  Patient will increase 10 meter walk test to >1.42m/s as to improve gait speed for better community ambulation and to reduce fall risk. Baseline: 32.77 sec Goal status: INITIAL  6.  Patient will increase six minute walk test distance to >1000 for progression to community ambulator and improve gait ability Baseline: Perform at subsequent visit Goal status: INITIAL    ASSESSMENT:  CLINICAL IMPRESSION:  Pt demonstrated improved gait upon arrival to the clinic with good carryover for increased step length.  Pt does demonstrate decreased step length when performing with the [facility], however is able to correct with therapist provided cuing.  Pt does still have difficulty landing on her heel, which then causes a reduction in step length and heel-to-toe gait pattern.  Pt may be best served by ambulating on the treadmill with an incline in order to facilitate the proper gait pattern.   Pt will continue to benefit from skilled therapy to address remaining deficits in order to improve overall QoL and return to PLOF.         OBJECTIVE IMPAIRMENTS: Abnormal gait, decreased activity tolerance, decreased balance, decreased endurance, decreased knowledge of use of DME, decreased mobility, difficulty  walking, and decreased strength.   ACTIVITY LIMITATIONS: carrying, lifting, standing, squatting, stairs, transfers, bed mobility, continence, bathing, toileting, dressing, and locomotion level  PARTICIPATION LIMITATIONS: cleaning, laundry, driving, shopping, community activity, yard work, and church  PERSONAL FACTORS: Age, Fitness, Past/current experiences, and Time since onset of injury/illness/exacerbation are also affecting patient's functional outcome.   REHAB POTENTIAL: Good  CLINICAL DECISION MAKING: Stable/uncomplicated  EVALUATION COMPLEXITY: Low  PLAN:  PT FREQUENCY: 2x/week  PT DURATION: 12 weeks  PLANNED INTERVENTIONS: Therapeutic exercises, Therapeutic activity, Neuromuscular re-education, Balance training, Gait training, Patient/Family education, Self Care, Joint mobilization, Stair training, Vestibular  training, Canalith repositioning, DME instructions, Aquatic Therapy, Dry Needling, Cryotherapy, Moist heat, Taping, Manual therapy, and Re-evaluation  PLAN FOR NEXT SESSION:   6MWT, HEP, continue with balance and LE strengthening exercises Potential gait on treadmill with incline    Gwenlyn Saran, PT, DPT Physical Therapist - [facility]  07/26/22, 4:44 PM

## 2022-07-29 ENCOUNTER — Ambulatory Visit: Payer: Medicare PPO

## 2022-07-29 DIAGNOSIS — M6281 Muscle weakness (generalized): Secondary | ICD-10-CM | POA: Diagnosis not present

## 2022-07-29 DIAGNOSIS — R29898 Other symptoms and signs involving the musculoskeletal system: Secondary | ICD-10-CM

## 2022-07-29 DIAGNOSIS — R262 Difficulty in walking, not elsewhere classified: Secondary | ICD-10-CM | POA: Diagnosis not present

## 2022-07-29 NOTE — Therapy (Signed)
OUTPATIENT PHYSICAL THERAPY NEURO TREATMENT   Patient Name: Heather Cooper MRN: [id number] DOB:12-23-1930, 89 y.o., female Today's Date: 07/29/2022   PCP: [name], MD REFERRING PROVIDER: Charlaine Dalton, MD  END OF SESSION:  PT End of Session - 07/29/22 1018     Visit Number 6    Number of Visits 24    Date for PT Re-Evaluation 10/04/22    PT Start Time 1018    PT Stop Time 1106    PT Time Calculation (min) 48 min    Equipment Utilized During Treatment Gait belt    Activity Tolerance Patient tolerated treatment well    Behavior During Therapy WFL for tasks assessed/performed               Past Medical History:  Diagnosis Date   Chemotherapy induced nausea and vomiting    Chicken pox    Esophageal cancer ([place])    Hypercalcemia    familial hypocalciuric hypercalcemia   Hypercholesterolemia    Hypertension    Lung cancer ([place])    Osteoporosis    Thyroid disease    Past Surgical History:  Procedure Laterality Date   ABDOMINAL HYSTERECTOMY     partial   CHOLECYSTECTOMY     COLONOSCOPY WITH PROPOFOL N/A 04/17/2017   Procedure: COLONOSCOPY WITH PROPOFOL;  Surgeon: Manya Silvas, MD;  Location: [facility] ENDOSCOPY;  Service: Endoscopy;  Laterality: N/A;   ESOPHAGOGASTRODUODENOSCOPY (EGD) WITH PROPOFOL N/A 04/13/2021   Procedure: ESOPHAGOGASTRODUODENOSCOPY (EGD) WITH PROPOFOL;  Surgeon: [name], MD;  Location: [facility] ENDOSCOPY;  Service: Endoscopy;  Laterality: N/A;   EUS N/A 04/22/2021   Procedure: FULL UPPER ENDOSCOPIC ULTRASOUND (EUS) RADIAL;  Surgeon: Jola Schmidt, MD;  Location: [facility] ENDOSCOPY;  Service: Endoscopy;  Laterality: N/A;  [facility] needed   EUS N/A 10/28/2021   Procedure: UPPER ENDOSCOPIC ULTRASOUND (EUS) LINEAR;  Surgeon: Reita Cliche, MD;  Location: [facility] ENDOSCOPY;  Service: Gastroenterology;  Laterality: N/A;  [facility]   transvaginal hysterectomy  04/18/05   with anterior colporrhaphy   Patient Active Problem List   Diagnosis Date  Noted   Encounter for immunotherapy 04/26/2022   Low TSH level 11/23/2021   PAD (peripheral artery disease) ([place]) 08/28/2021   Emphysema of lung ([place]) 08/28/2021   Esophageal cancer ([place]) 05/30/2021   Diarrhea 05/30/2021   Neoplasm of middle third of esophagus 05/10/2021   Stricture and stenosis of esophagus    Dysphagia 01/22/2021   Type 2 diabetes mellitus with hyperglycemia ([place]) 01/19/2021   COVID-19 virus infection 06/21/2020   Anemia 05/11/2020   Malignant neoplasm of right upper lobe of lung ([place]) 08/07/2019   URI (upper respiratory infection) 09/16/2016   Health care maintenance 05/18/2016   History of lung cancer 02/08/2016   Abdominal aortic aneurysm ([place]) 08/20/2015   Abnormal CXR 03/06/2014   Cough 03/06/2014   Headache(784.0) 03/06/2014   Hyperglycemia 02/07/2013   Hypertension 06/26/2012   Hypercholesterolemia 06/26/2012   Osteoporosis 06/26/2012   Hypercalcemia 06/26/2012   GERD (gastroesophageal reflux disease) 06/26/2012    ONSET DATE: 9 months, 10/2021  REFERRING DIAG: M62.81 (ICD-10-CM) - Muscle weakness (generalized)   THERAPY DIAG:  Muscle weakness (generalized)  Difficulty in walking, not elsewhere classified  Weakness of both hips  Rationale for Evaluation and Treatment: Rehabilitation  SUBJECTIVE:  SUBJECTIVE STATEMENT:  Pt reports she is doing well and has been practicing her exercises.  Pt overall is doing well.    Pt accompanied by: family member, Daughter Heather Cooper  PERTINENT HISTORY:  Pt developed has history of cancer x2, but is in remission.  Pt has been on prednisone for 3 months, but should be off it in the next few days.  MD feels as though that has contributed to her weakness.  Pt has caregiver that comes over 3x week that helps washing clothes due to  having some things on the ground floor.  PAIN:  Are you having pain? No  PRECAUTIONS: Other: Cancer  WEIGHT BEARING RESTRICTIONS: No  FALLS: Has patient fallen in last 6 months? No  LIVING ENVIRONMENT: Lives with: lives alone Lives in: House/apartment Stairs: Yes: Internal: 13 steps; can reach both Has following equipment at home: Single point cane, Walker - 4 wheeled, shower chair, and Grab bars  PLOF: Independent and needing some assistance with driving.  PATIENT GOALS: To get stronger and stop utilizing the walker as much.  OBJECTIVE:   DIAGNOSTIC FINDINGS:   EXAM: NUCLEAR MEDICINE PET SKULL BASE TO THIGH  IMPRESSION: 1. No scintigraphic evidence of residual or recurrent esophageal hypermetabolism. 2. No evidence of hypermetabolic metastatic disease. 3. Similar aneurysmal dilation of the infrarenal abdominal aorta measuring 4.6 cm. Recommend follow-up CT/MR every 6 months and vascular consultation. This recommendation follows ACR consensus guidelines: White Paper of the ACR Incidental Findings Committee II on Vascular Findings. J Am Coll Radiol 2013; 35:009-381. 4. Aortic Atherosclerosis (ICD10-I70.0) and Emphysema (ICD10-J43.9).  COGNITION: Overall cognitive status: Within functional limits for tasks assessed   SENSATION: WFL  COORDINATION: WFL  POSTURE: No Significant postural limitations   LOWER EXTREMITY MMT:    MMT Right Eval Left Eval  Hip flexion 4 4  Hip abduction 4 4  Hip adduction 4- 4-  Knee flexion 4- 4-  Knee extension 4- 4-  Ankle dorsiflexion 4- 4-  (Blank rows = not tested)  FUNCTIONAL TESTS:  5 times sit to stand: 34.89 sec Timed up and go (TUG): 27.56 sec 6 minute walk test: Perform at next session 10 meter walk test: 32.77 sec Berg Balance Scale: 37/56  PATIENT SURVEYS:  FOTO 45/54  TODAY'S TREATMENT: DATE: 07/29/22   TherEx:  Standing hip abduction with 4# AW donned, 2x10 Standing marching with 4# AW donned,  2x10 Standing hip extension with 4# AW donned, 2x10 Standing hamstring curls with 4# AW donned, 2x10 Seated hip adduction into rainbow physioball, 3 sec holds, 2x15 Standing heel raises while wearing 4# AW, 2x10    Gait Training:   Ambulation on treadmill, 5 min 30 sec, incline ranging from 6-10%, speed ranging 0.6-1.0 mph, verbal cuing for slowed step with longer step length  Ambulation with use of rollator utilizing laser, verbal and visual cuing for increased step length and heel-to-toe gait pattern, x3 laps around the gym (148' x3)  Ambulation with use of SPC, verbal and visual cuing for increased step length and heel-to-toe gait pattern, x1 laps around the gym (148' x3)  Ambulation without AD, verbal and visual cuing for increased step length and heel-to-toe gait pattern, x1 laps around the gym (148' x3)     PATIENT EDUCATION: Education details: Pt educated on role of PT and services provided during current POC, along with prognosis and information about the clinic. Person educated: Patient and Child(ren) Education method: Explanation Education comprehension: verbalized understanding  HOME EXERCISE PROGRAM:  Access Code: [email or web address] URL: [email or web address]  Date: 07/14/2022 Prepared by: [place]  Exercises - Supine Bridge  - 1 x daily - 7 x weekly - 3 sets - 10 reps - Clamshell  - 1 x daily - 7 x weekly - 3 sets - 10 reps - Sidelying Hip Abduction  - 1 x daily - 7 x weekly - 3 sets - 10 reps - Supine Posterior Pelvic Tilt  - 1 x daily - 7 x weekly - 3 sets - 10 reps - Sidelying Reverse Clamshell  - 1 x daily - 7 x weekly - 3 sets - 10 reps - Active Straight Leg Raise with Quad Set  - 1 x daily - 7 x weekly - 3 sets - 10 reps  GOALS: Goals reviewed with patient? Yes  SHORT TERM GOALS: Target date: 08/09/2022  Pt will be independent with HEP in order to demonstrate increased ability to perform tasks related to occupation/hobbies. Baseline: Pt to be  given HEP at next visit Goal status: INITIAL  LONG TERM GOALS: Target date: 10/04/2022  1.  Patient (> 32 years old) will complete five times sit to stand test in < 15 seconds indicating an increased LE strength and improved balance. Baseline: 34.89 sec Goal status: INITIAL  2.  Patient will increase FOTO score to equal to or greater than 54 to demonstrate statistically significant improvement in mobility and quality of life.  Baseline: FOTO: 45 Goal status: INITIAL   3.  Patient will increase Berg Balance score by > 6 points to demonstrate decreased fall risk during functional activities. Baseline: BERG: 37 Goal status: INITIAL   4.  Patient will reduce timed up and go to <11 seconds to reduce fall risk and demonstrate improved transfer/gait ability. Baseline: 27.56 sec Goal status: INITIAL  5.  Patient will increase 10 meter walk test to >1.3m/s as to improve gait speed for better community ambulation and to reduce fall risk. Baseline: 32.77 sec Goal status: INITIAL  6.  Patient will increase six minute walk test distance to >1000 for progression to community ambulator and improve gait ability Baseline: Perform at subsequent visit Goal status: INITIAL    ASSESSMENT:  CLINICAL IMPRESSION:  Pt continues to respond well to the gait training mechanisms utilized when on overground, but did have difficulty with the treadmill activity.  Pt required consistent verbal cuing for treadmill activity due to take small shuffled steps and not landing on the heel.  Even with incline, pt unable to utilize posterior musculature in order to ambulate with better technique.  Once on overground and utilizing her AD's and even one lap without, she performed much better.  Pt still lacks confidence to perform proper gait mechanics but will continue to improve with techniques from applied by therapist.  Pt will continue to benefit from skilled therapy to address remaining deficits in order to improve  overall QoL and return to PLOF.          OBJECTIVE IMPAIRMENTS: Abnormal gait, decreased activity tolerance, decreased balance, decreased endurance, decreased knowledge of use of DME, decreased mobility, difficulty walking, and decreased strength.   ACTIVITY LIMITATIONS: carrying, lifting, standing, squatting, stairs, transfers, bed mobility, continence, bathing, toileting, dressing, and locomotion level  PARTICIPATION LIMITATIONS: cleaning, laundry, driving, shopping, community activity, yard work, and church  PERSONAL FACTORS: Age, Fitness, Past/current experiences, and Time since onset of injury/illness/exacerbation are also affecting patient's functional outcome.   REHAB POTENTIAL: Good  CLINICAL DECISION MAKING: Stable/uncomplicated  EVALUATION COMPLEXITY: Low  PLAN:  PT FREQUENCY: 2x/week  PT DURATION:  12 weeks  PLANNED INTERVENTIONS: Therapeutic exercises, Therapeutic activity, Neuromuscular re-education, Balance training, Gait training, Patient/Family education, Self Care, Joint mobilization, Stair training, Vestibular training, Canalith repositioning, DME instructions, Aquatic Therapy, Dry Needling, Cryotherapy, Moist heat, Taping, Manual therapy, and Re-evaluation  PLAN FOR NEXT SESSION:   6MWT, HEP, continue with balance and LE strengthening exercises Potential gait on treadmill with incline    Gwenlyn Saran, PT, DPT Physical Therapist - [facility]  07/29/22, 11:25 AM

## 2022-08-02 ENCOUNTER — Ambulatory Visit: Payer: Medicare PPO

## 2022-08-02 DIAGNOSIS — M6281 Muscle weakness (generalized): Secondary | ICD-10-CM

## 2022-08-02 DIAGNOSIS — R29898 Other symptoms and signs involving the musculoskeletal system: Secondary | ICD-10-CM

## 2022-08-02 DIAGNOSIS — R262 Difficulty in walking, not elsewhere classified: Secondary | ICD-10-CM

## 2022-08-02 NOTE — Therapy (Signed)
OUTPATIENT PHYSICAL THERAPY NEURO TREATMENT   Patient Name: Heather Cooper MRN: [id number] DOB:1931-04-08, 89 y.o., female Today's Date: 08/02/2022   PCP: [name], MD REFERRING PROVIDER: Charlaine Dalton, MD  END OF SESSION:  PT End of Session - 08/02/22 0854     Visit Number 7    Number of Visits 24    Date for PT Re-Evaluation 10/04/22    PT Start Time 0851    PT Stop Time 0930    PT Time Calculation (min) 39 min    Equipment Utilized During Treatment Gait belt    Activity Tolerance Patient tolerated treatment well    Behavior During Therapy WFL for tasks assessed/performed               Past Medical History:  Diagnosis Date   Chemotherapy induced nausea and vomiting    Chicken pox    Esophageal cancer ([place])    Hypercalcemia    familial hypocalciuric hypercalcemia   Hypercholesterolemia    Hypertension    Lung cancer ([place])    Osteoporosis    Thyroid disease    Past Surgical History:  Procedure Laterality Date   ABDOMINAL HYSTERECTOMY     partial   CHOLECYSTECTOMY     COLONOSCOPY WITH PROPOFOL N/A 04/17/2017   Procedure: COLONOSCOPY WITH PROPOFOL;  Surgeon: Manya Silvas, MD;  Location: [facility] ENDOSCOPY;  Service: Endoscopy;  Laterality: N/A;   ESOPHAGOGASTRODUODENOSCOPY (EGD) WITH PROPOFOL N/A 04/13/2021   Procedure: ESOPHAGOGASTRODUODENOSCOPY (EGD) WITH PROPOFOL;  Surgeon: [name], MD;  Location: [facility] ENDOSCOPY;  Service: Endoscopy;  Laterality: N/A;   EUS N/A 04/22/2021   Procedure: FULL UPPER ENDOSCOPIC ULTRASOUND (EUS) RADIAL;  Surgeon: Jola Schmidt, MD;  Location: [facility] ENDOSCOPY;  Service: Endoscopy;  Laterality: N/A;  [facility] needed   EUS N/A 10/28/2021   Procedure: UPPER ENDOSCOPIC ULTRASOUND (EUS) LINEAR;  Surgeon: Reita Cliche, MD;  Location: [facility] ENDOSCOPY;  Service: Gastroenterology;  Laterality: N/A;  [facility]   transvaginal hysterectomy  04/18/05   with anterior colporrhaphy   Patient Active Problem List   Diagnosis Date  Noted   Encounter for immunotherapy 04/26/2022   Low TSH level 11/23/2021   PAD (peripheral artery disease) ([place]) 08/28/2021   Emphysema of lung ([place]) 08/28/2021   Esophageal cancer ([place]) 05/30/2021   Diarrhea 05/30/2021   Neoplasm of middle third of esophagus 05/10/2021   Stricture and stenosis of esophagus    Dysphagia 01/22/2021   Type 2 diabetes mellitus with hyperglycemia ([place]) 01/19/2021   COVID-19 virus infection 06/21/2020   Anemia 05/11/2020   Malignant neoplasm of right upper lobe of lung ([place]) 08/07/2019   URI (upper respiratory infection) 09/16/2016   Health care maintenance 05/18/2016   History of lung cancer 02/08/2016   Abdominal aortic aneurysm ([place]) 08/20/2015   Abnormal CXR 03/06/2014   Cough 03/06/2014   Headache(784.0) 03/06/2014   Hyperglycemia 02/07/2013   Hypertension 06/26/2012   Hypercholesterolemia 06/26/2012   Osteoporosis 06/26/2012   Hypercalcemia 06/26/2012   GERD (gastroesophageal reflux disease) 06/26/2012    ONSET DATE: 9 months, 10/2021  REFERRING DIAG: M62.81 (ICD-10-CM) - Muscle weakness (generalized)   THERAPY DIAG:  Muscle weakness (generalized)  Difficulty in walking, not elsewhere classified  Weakness of both hips  Rationale for Evaluation and Treatment: Rehabilitation  SUBJECTIVE:  SUBJECTIVE STATEMENT:  Pt reports she is doing well and has been practicing her exercises.  Pt overall is doing well.    Pt accompanied by: family member, Daughter [name]  PERTINENT HISTORY:  Pt developed has history of cancer x2, but is in remission.  Pt has been on prednisone for 3 months, but should be off it in the next few days.  MD feels as though that has contributed to her weakness.  Pt has caregiver that comes over 3x week that helps washing clothes due to  having some things on the ground floor.  PAIN:  Are you having pain? No  PRECAUTIONS: Other: Cancer  WEIGHT BEARING RESTRICTIONS: No  FALLS: Has patient fallen in last 6 months? No  LIVING ENVIRONMENT: Lives with: lives alone Lives in: House/apartment Stairs: Yes: Internal: 13 steps; can reach both Has following equipment at home: Single point cane, Walker - 4 wheeled, shower chair, and Grab bars  PLOF: Independent and needing some assistance with driving.  PATIENT GOALS: To get stronger and stop utilizing the walker as much.  OBJECTIVE:   DIAGNOSTIC FINDINGS:   EXAM: NUCLEAR MEDICINE PET SKULL BASE TO THIGH  IMPRESSION: 1. No scintigraphic evidence of residual or recurrent esophageal hypermetabolism. 2. No evidence of hypermetabolic metastatic disease. 3. Similar aneurysmal dilation of the infrarenal abdominal aorta measuring 4.6 cm. Recommend follow-up CT/MR every 6 months and vascular consultation. This recommendation follows ACR consensus guidelines: White Paper of the ACR Incidental Findings Committee II on Vascular Findings. J Am Coll Radiol 2013; 10:272-536. 4. Aortic Atherosclerosis (ICD10-I70.0) and Emphysema (ICD10-J43.9).  COGNITION: Overall cognitive status: Within functional limits for tasks assessed   SENSATION: WFL  COORDINATION: WFL  POSTURE: No Significant postural limitations   LOWER EXTREMITY MMT:    MMT Right Eval Left Eval  Hip flexion 4 4  Hip abduction 4 4  Hip adduction 4- 4-  Knee flexion 4- 4-  Knee extension 4- 4-  Ankle dorsiflexion 4- 4-  (Blank rows = not tested)  FUNCTIONAL TESTS:  5 times sit to stand: 34.89 sec Timed up and go (TUG): 27.56 sec 6 minute walk test: 94' with use of rollator 10 meter walk test: 32.77 sec Berg Balance Scale: 37/56  PATIENT SURVEYS:  FOTO 45/54  TODAY'S TREATMENT: DATE: 08/02/22   6MWT: 69' with use of rollator   TherEx:  Standing hip abduction with 4# AW donned,  2x10 Standing marching with 4# AW donned, 2x10 Standing hip extension with 4# AW donned, 2x10 Standing hamstring curls with 4# AW donned, 2x10 Seated hip adduction into yellow physioball, 3 sec holds, 2x15 Seated lateral step over hedgehogs, 2x10 Standing heel raises while wearing 4# AW, 2x10      PATIENT EDUCATION: Education details: Pt educated on role of PT and services provided during current POC, along with prognosis and information about the clinic. Person educated: Patient and Child(ren) Education method: Explanation Education comprehension: verbalized understanding  HOME EXERCISE PROGRAM:  Access Code: [email or web address] URL: [email or web address] Date: 07/14/2022 Prepared by: [place]  Exercises - Supine Bridge  - 1 x daily - 7 x weekly - 3 sets - 10 reps - Clamshell  - 1 x daily - 7 x weekly - 3 sets - 10 reps - Sidelying Hip Abduction  - 1 x daily - 7 x weekly - 3 sets - 10 reps - Supine Posterior Pelvic Tilt  - 1 x daily - 7 x weekly - 3 sets - 10 reps - Sidelying Reverse Clamshell  - 1 x  daily - 7 x weekly - 3 sets - 10 reps - Active Straight Leg Raise with Quad Set  - 1 x daily - 7 x weekly - 3 sets - 10 reps  GOALS: Goals reviewed with patient? Yes  SHORT TERM GOALS: Target date: 08/09/2022  Pt will be independent with HEP in order to demonstrate increased ability to perform tasks related to occupation/hobbies. Baseline: Pt to be given HEP at next visit Goal status: INITIAL  LONG TERM GOALS: Target date: 10/04/2022  1.  Patient (> 10 years old) will complete five times sit to stand test in < 15 seconds indicating an increased LE strength and improved balance. Baseline: 34.89 sec Goal status: INITIAL  2.  Patient will increase FOTO score to equal to or greater than 54 to demonstrate statistically significant improvement in mobility and quality of life.  Baseline: FOTO: 45 Goal status: INITIAL   3.  Patient will increase Berg Balance score by > 6  points to demonstrate decreased fall risk during functional activities. Baseline: BERG: 37 Goal status: INITIAL   4.  Patient will reduce timed up and go to <11 seconds to reduce fall risk and demonstrate improved transfer/gait ability. Baseline: 27.56 sec Goal status: INITIAL  5.  Patient will increase 10 meter walk test to >1.6m/s as to improve gait speed for better community ambulation and to reduce fall risk. Baseline: 32.77 sec Goal status: INITIAL  6.  Patient will increase six minute walk test distance to >1000 for progression to community ambulator and improve gait ability Baseline: 19' with use of rollator Goal status: INITIAL    ASSESSMENT:  CLINICAL IMPRESSION:  Pt continues to respond well and is making good improvements towards her goals at this time.  Pt is able to achieve greater cadence with increased step length more consistently, and is becoming more independent with her exercises at home.  Pt is more consistent with her exercises and it is evident during tasks while in therapy.  Pt to continue to benefit from continued strengthening and gait training in order to improve tolerance and endurance levels with better mechanics going forward.    Pt will continue to benefit from skilled therapy to address remaining deficits in order to improve overall QoL and return to PLOF.         OBJECTIVE IMPAIRMENTS: Abnormal gait, decreased activity tolerance, decreased balance, decreased endurance, decreased knowledge of use of DME, decreased mobility, difficulty walking, and decreased strength.   ACTIVITY LIMITATIONS: carrying, lifting, standing, squatting, stairs, transfers, bed mobility, continence, bathing, toileting, dressing, and locomotion level  PARTICIPATION LIMITATIONS: cleaning, laundry, driving, shopping, community activity, yard work, and church  PERSONAL FACTORS: Age, Fitness, Past/current experiences, and Time since onset of injury/illness/exacerbation are also  affecting patient's functional outcome.   REHAB POTENTIAL: Good  CLINICAL DECISION MAKING: Stable/uncomplicated  EVALUATION COMPLEXITY: Low  PLAN:  PT FREQUENCY: 2x/week  PT DURATION: 12 weeks  PLANNED INTERVENTIONS: Therapeutic exercises, Therapeutic activity, Neuromuscular re-education, Balance training, Gait training, Patient/Family education, Self Care, Joint mobilization, Stair training, Vestibular training, Canalith repositioning, DME instructions, Aquatic Therapy, Dry Needling, Cryotherapy, Moist heat, Taping, Manual therapy, and Re-evaluation  PLAN FOR NEXT SESSION:   HEP, continue with balance and LE strengthening exercises Potential gait on treadmill with incline    Gwenlyn Saran, PT, DPT Physical Therapist - [facility]  08/02/22, 8:54 AM

## 2022-08-04 ENCOUNTER — Ambulatory Visit: Payer: Medicare PPO

## 2022-08-04 DIAGNOSIS — R29898 Other symptoms and signs involving the musculoskeletal system: Secondary | ICD-10-CM

## 2022-08-04 DIAGNOSIS — R262 Difficulty in walking, not elsewhere classified: Secondary | ICD-10-CM

## 2022-08-04 DIAGNOSIS — M6281 Muscle weakness (generalized): Secondary | ICD-10-CM | POA: Diagnosis not present

## 2022-08-04 NOTE — Therapy (Signed)
OUTPATIENT PHYSICAL THERAPY NEURO TREATMENT   Patient Name: Heather Cooper MRN: [id number] DOB:21-Jul-1930, 89 y.o., female Today's Date: 08/04/2022   PCP: [name], MD REFERRING PROVIDER: Charlaine Dalton, MD  END OF SESSION:  PT End of Session - 08/04/22 0938     Visit Number 8    Number of Visits 24    Date for PT Re-Evaluation 10/04/22    PT Start Time 0936    PT Stop Time 1020    PT Time Calculation (min) 44 min    Equipment Utilized During Treatment Gait belt    Activity Tolerance Patient tolerated treatment well    Behavior During Therapy WFL for tasks assessed/performed             Past Medical History:  Diagnosis Date   Chemotherapy induced nausea and vomiting    Chicken pox    Esophageal cancer ([place])    Hypercalcemia    familial hypocalciuric hypercalcemia   Hypercholesterolemia    Hypertension    Lung cancer ([place])    Osteoporosis    Thyroid disease    Past Surgical History:  Procedure Laterality Date   ABDOMINAL HYSTERECTOMY     partial   CHOLECYSTECTOMY     COLONOSCOPY WITH PROPOFOL N/A 04/17/2017   Procedure: COLONOSCOPY WITH PROPOFOL;  Surgeon: Manya Silvas, MD;  Location: [facility] ENDOSCOPY;  Service: Endoscopy;  Laterality: N/A;   ESOPHAGOGASTRODUODENOSCOPY (EGD) WITH PROPOFOL N/A 04/13/2021   Procedure: ESOPHAGOGASTRODUODENOSCOPY (EGD) WITH PROPOFOL;  Surgeon: [name], MD;  Location: [facility] ENDOSCOPY;  Service: Endoscopy;  Laterality: N/A;   EUS N/A 04/22/2021   Procedure: FULL UPPER ENDOSCOPIC ULTRASOUND (EUS) RADIAL;  Surgeon: Jola Schmidt, MD;  Location: [facility] ENDOSCOPY;  Service: Endoscopy;  Laterality: N/A;  [facility] needed   EUS N/A 10/28/2021   Procedure: UPPER ENDOSCOPIC ULTRASOUND (EUS) LINEAR;  Surgeon: Reita Cliche, MD;  Location: [facility] ENDOSCOPY;  Service: Gastroenterology;  Laterality: N/A;  [facility]   transvaginal hysterectomy  04/18/05   with anterior colporrhaphy   Patient Active Problem List   Diagnosis Date  Noted   Encounter for immunotherapy 04/26/2022   Low TSH level 11/23/2021   PAD (peripheral artery disease) ([place]) 08/28/2021   Emphysema of lung ([place]) 08/28/2021   Esophageal cancer ([place]) 05/30/2021   Diarrhea 05/30/2021   Neoplasm of middle third of esophagus 05/10/2021   Stricture and stenosis of esophagus    Dysphagia 01/22/2021   Type 2 diabetes mellitus with hyperglycemia ([place]) 01/19/2021   COVID-19 virus infection 06/21/2020   Anemia 05/11/2020   Malignant neoplasm of right upper lobe of lung ([place]) 08/07/2019   URI (upper respiratory infection) 09/16/2016   Health care maintenance 05/18/2016   History of lung cancer 02/08/2016   Abdominal aortic aneurysm ([place]) 08/20/2015   Abnormal CXR 03/06/2014   Cough 03/06/2014   Headache(784.0) 03/06/2014   Hyperglycemia 02/07/2013   Hypertension 06/26/2012   Hypercholesterolemia 06/26/2012   Osteoporosis 06/26/2012   Hypercalcemia 06/26/2012   GERD (gastroesophageal reflux disease) 06/26/2012    ONSET DATE: 9 months, 10/2021  REFERRING DIAG: M62.81 (ICD-10-CM) - Muscle weakness (generalized)   THERAPY DIAG:  Muscle weakness (generalized)  Difficulty in walking, not elsewhere classified  Weakness of both hips  Rationale for Evaluation and Treatment: Rehabilitation  SUBJECTIVE:  SUBJECTIVE STATEMENT:  Pt reports she is doing well.  She states that she didn't want to get up this morning, but otherwise is doing great this morning.   Pt accompanied by: family member, Daughter Heather Cooper  PERTINENT HISTORY:  Pt developed has history of cancer x2, but is in remission.  Pt has been on prednisone for 3 months, but should be off it in the next few days.  MD feels as though that has contributed to her weakness.  Pt has caregiver that comes over 3x week  that helps washing clothes due to having some things on the ground floor.  PAIN:  Are you having pain? No  PRECAUTIONS: Other: Cancer  WEIGHT BEARING RESTRICTIONS: No  FALLS: Has patient fallen in last 6 months? No  LIVING ENVIRONMENT: Lives with: lives alone Lives in: House/apartment Stairs: Yes: Internal: 13 steps; can reach both Has following equipment at home: Single point cane, Walker - 4 wheeled, shower chair, and Grab bars  PLOF: Independent and needing some assistance with driving.  PATIENT GOALS: To get stronger and stop utilizing the walker as much.  OBJECTIVE:   DIAGNOSTIC FINDINGS:   EXAM: NUCLEAR MEDICINE PET SKULL BASE TO THIGH  IMPRESSION: 1. No scintigraphic evidence of residual or recurrent esophageal hypermetabolism. 2. No evidence of hypermetabolic metastatic disease. 3. Similar aneurysmal dilation of the infrarenal abdominal aorta measuring 4.6 cm. Recommend follow-up CT/MR every 6 months and vascular consultation. This recommendation follows ACR consensus guidelines: White Paper of the ACR Incidental Findings Committee II on Vascular Findings. J Am Coll Radiol 2013; 63:016-010. 4. Aortic Atherosclerosis (ICD10-I70.0) and Emphysema (ICD10-J43.9).  COGNITION: Overall cognitive status: Within functional limits for tasks assessed   SENSATION: WFL  COORDINATION: WFL  POSTURE: No Significant postural limitations   LOWER EXTREMITY MMT:    MMT Right Eval Left Eval  Hip flexion 4 4  Hip abduction 4 4  Hip adduction 4- 4-  Knee flexion 4- 4-  Knee extension 4- 4-  Ankle dorsiflexion 4- 4-  (Blank rows = not tested)  FUNCTIONAL TESTS:  5 times sit to stand: 34.89 sec Timed up and go (TUG): 27.56 sec 6 minute walk test: 51' with use of rollator 10 meter walk test: 32.77 sec Berg Balance Scale: 37/56  PATIENT SURVEYS:  FOTO 45/54  TODAY'S TREATMENT: DATE: 08/04/22   Neuro:  [place] Hard Tux Racer, 3 total race attempts, stability  level 3 Korebalance Tux Racer, Practice mode - Extras, Bronze set, x1 attempts 5 min 2 seconds  Activity Description: Random taps with blaze pods on differing elevated surfaces, Pink = L LE; Blue  = R LE Activity Setting:  Random Number of Pods:  6 Cycles/Sets:  3 Duration (Time or Hit Count):  20 hits  Patient Stats  Reaction Time:    4,460 4,194 2,088  Seated ankle plantarflexion/dorsiflexion with use of half foam roller for increased ROM, 2x15 each direction Static stance on airex pad for righting strategy of ankle musculature, 30 sec bouts x2 Weight shifts forward/backward/lateral while standing on airex pad for increased proprioception, 2x15 each direction      PATIENT EDUCATION: Education details: Pt educated on role of PT and services provided during current POC, along with prognosis and information about the clinic. Person educated: Patient and Child(ren) Education method: Explanation Education comprehension: verbalized understanding  HOME EXERCISE PROGRAM:  Access Code: [email or web address] URL: [email or web address] Date: 07/14/2022 Prepared by: [place]  Exercises - Supine Bridge  - 1 x daily - 7 x weekly - 3 sets -  10 reps - Clamshell  - 1 x daily - 7 x weekly - 3 sets - 10 reps - Sidelying Hip Abduction  - 1 x daily - 7 x weekly - 3 sets - 10 reps - Supine Posterior Pelvic Tilt  - 1 x daily - 7 x weekly - 3 sets - 10 reps - Sidelying Reverse Clamshell  - 1 x daily - 7 x weekly - 3 sets - 10 reps - Active Straight Leg Raise with Quad Set  - 1 x daily - 7 x weekly - 3 sets - 10 reps  GOALS: Goals reviewed with patient? Yes  SHORT TERM GOALS: Target date: 08/09/2022  Pt will be independent with HEP in order to demonstrate increased ability to perform tasks related to occupation/hobbies. Baseline: Pt to be given HEP at next visit Goal status: INITIAL  LONG TERM GOALS: Target date: 10/04/2022  1.  Patient (> 62 years old) will complete five times  sit to stand test in < 15 seconds indicating an increased LE strength and improved balance. Baseline: 34.89 sec Goal status: INITIAL  2.  Patient will increase FOTO score to equal to or greater than 54 to demonstrate statistically significant improvement in mobility and quality of life.  Baseline: FOTO: 45 Goal status: INITIAL   3.  Patient will increase Berg Balance score by > 6 points to demonstrate decreased fall risk during functional activities. Baseline: BERG: 37 Goal status: INITIAL   4.  Patient will reduce timed up and go to <11 seconds to reduce fall risk and demonstrate improved transfer/gait ability. Baseline: 27.56 sec Goal status: INITIAL  5.  Patient will increase 10 meter walk test to >1.20m/s as to improve gait speed for better community ambulation and to reduce fall risk. Baseline: 32.77 sec Goal status: INITIAL  6.  Patient will increase six minute walk test distance to >1000 for progression to community ambulator and improve gait ability Baseline: 75' with use of rollator Goal status: INITIAL    ASSESSMENT:  CLINICAL IMPRESSION:  Pt performed well with the exercises and noted some fatigue today following the prolonged standing activities that she performed.  Pt continues to respond well and is making good progress with walking, requiring less verbal cuing at times for increased step length and slowed cadence.   Pt will continue to benefit from skilled therapy to address remaining deficits in order to improve overall QoL and return to PLOF.         OBJECTIVE IMPAIRMENTS: Abnormal gait, decreased activity tolerance, decreased balance, decreased endurance, decreased knowledge of use of DME, decreased mobility, difficulty walking, and decreased strength.   ACTIVITY LIMITATIONS: carrying, lifting, standing, squatting, stairs, transfers, bed mobility, continence, bathing, toileting, dressing, and locomotion level  PARTICIPATION LIMITATIONS: cleaning, laundry,  driving, shopping, community activity, yard work, and church  PERSONAL FACTORS: Age, Fitness, Past/current experiences, and Time since onset of injury/illness/exacerbation are also affecting patient's functional outcome.   REHAB POTENTIAL: Good  CLINICAL DECISION MAKING: Stable/uncomplicated  EVALUATION COMPLEXITY: Low  PLAN:  PT FREQUENCY: 2x/week  PT DURATION: 12 weeks  PLANNED INTERVENTIONS: Therapeutic exercises, Therapeutic activity, Neuromuscular re-education, Balance training, Gait training, Patient/Family education, Self Care, Joint mobilization, Stair training, Vestibular training, Canalith repositioning, DME instructions, Aquatic Therapy, Dry Needling, Cryotherapy, Moist heat, Taping, Manual therapy, and Re-evaluation  PLAN FOR NEXT SESSION:   HEP, continue with balance and LE strengthening exercises Potential gait on treadmill with incline   Gwenlyn Saran, PT, DPT Physical Therapist - [place]  [facility]  08/04/22, 10:45 AM

## 2022-08-05 ENCOUNTER — Ambulatory Visit: Payer: Medicare PPO

## 2022-08-08 ENCOUNTER — Ambulatory Visit: Payer: Medicare PPO

## 2022-08-09 ENCOUNTER — Ambulatory Visit: Payer: Medicare PPO

## 2022-08-09 DIAGNOSIS — R29898 Other symptoms and signs involving the musculoskeletal system: Secondary | ICD-10-CM

## 2022-08-09 DIAGNOSIS — M6281 Muscle weakness (generalized): Secondary | ICD-10-CM | POA: Diagnosis not present

## 2022-08-09 DIAGNOSIS — R262 Difficulty in walking, not elsewhere classified: Secondary | ICD-10-CM

## 2022-08-09 NOTE — Therapy (Signed)
OUTPATIENT PHYSICAL THERAPY NEURO TREATMENT   Patient Name: Heather Cooper MRN: [id number] DOB:[birth year], 89 y.o., female Today's Date: 08/09/2022   PCP: [name], MD REFERRING PROVIDER: Charlaine Dalton, MD  END OF SESSION:  PT End of Session - 08/09/22 0934     Visit Number 9    Number of Visits 24    Date for PT Re-Evaluation 10/04/22    PT Start Time 0932    PT Stop Time 1015    PT Time Calculation (min) 43 min    Equipment Utilized During Treatment Gait belt    Activity Tolerance Patient tolerated treatment well    Behavior During Therapy WFL for tasks assessed/performed              Past Medical History:  Diagnosis Date   Chemotherapy induced nausea and vomiting    Chicken pox    Esophageal cancer ([place])    Hypercalcemia    familial hypocalciuric hypercalcemia   Hypercholesterolemia    Hypertension    Lung cancer ([place])    Osteoporosis    Thyroid disease    Past Surgical History:  Procedure Laterality Date   ABDOMINAL HYSTERECTOMY     partial   CHOLECYSTECTOMY     COLONOSCOPY WITH PROPOFOL N/A 04/17/2017   Procedure: COLONOSCOPY WITH PROPOFOL;  Surgeon: Manya Silvas, MD;  Location[facility] ENDOSCOPY;  Service: Endoscopy;  Laterality: N/A;   ESOPHAGOGASTRODUODENOSCOPY (EGD) WITH PROPOFOL N/A 04/13/2021   Procedure: ESOPHAGOGASTRODUODENOSCOPY (EGD) WITH PROPOFOL;  Surgeon: [name], MD;  Location: [facility] ENDOSCOPY;  Service: Endoscopy;  Laterality: N/A;   EUS N/A 04/22/2021   Procedure: FULL UPPER ENDOSCOPIC ULTRASOUND (EUS) RADIAL;  Surgeon: Jola Schmidt, MD;  Location: [facility] ENDOSCOPY;  Service: Endoscopy;  Laterality: N/A;  [facility] needed   EUS N/A 10/28/2021   Procedure: UPPER ENDOSCOPIC ULTRASOUND (EUS) LINEAR;  Surgeon: Reita Cliche, MD;  Location: [facility] ENDOSCOPY;  Service: Gastroenterology;  Laterality: N/A;  [facility]   transvaginal hysterectomy  04/18/05   with anterior colporrhaphy   Patient Active Problem List   Diagnosis Date  Noted   Encounter for immunotherapy 04/26/2022   Low TSH level 11/23/2021   PAD (peripheral artery disease) ([place]) 08/28/2021   Emphysema of lung ([place]) 08/28/2021   Esophageal cancer ([place]) 05/30/2021   Diarrhea 05/30/2021   Neoplasm of middle third of esophagus 05/10/2021   Stricture and stenosis of esophagus    Dysphagia 01/22/2021   Type 2 diabetes mellitus with hyperglycemia ([place]) 01/19/2021   COVID-19 virus infection 06/21/2020   Anemia 05/11/2020   Malignant neoplasm of right upper lobe of lung ([place]) 08/07/2019   URI (upper respiratory infection) 09/16/2016   Health care maintenance 05/18/2016   History of lung cancer 02/08/2016   Abdominal aortic aneurysm ([place]) 08/20/2015   Abnormal CXR 03/06/2014   Cough 03/06/2014   Headache(784.0) 03/06/2014   Hyperglycemia 02/07/2013   Hypertension 06/26/2012   Hypercholesterolemia 06/26/2012   Osteoporosis 06/26/2012   Hypercalcemia 06/26/2012   GERD (gastroesophageal reflux disease) 06/26/2012    ONSET DATE: 9 months, 10/2021  REFERRING DIAG: M62.81 (ICD-10-CM) - Muscle weakness (generalized)   THERAPY DIAG:  Muscle weakness (generalized)  Difficulty in walking, not elsewhere classified  Weakness of both hips  Rationale for Evaluation and Treatment: Rehabilitation  SUBJECTIVE:  SUBJECTIVE STATEMENT:  Pt reports she has been doing well and was able to go to [place] on Sunday.  Pt states she has some nasal stuff going on, thinking it may be allergies.     Pt accompanied by: family member, Daughter [name]  PERTINENT HISTORY:  Pt developed has history of cancer x2, but is in remission.  Pt has been on prednisone for 3 months, but should be off it in the next few days.  MD feels as though that has contributed to her weakness.  Pt has caregiver  that comes over 3x week that helps washing clothes due to having some things on the ground floor.  PAIN:  Are you having pain? No  PRECAUTIONS: Other: Cancer  WEIGHT BEARING RESTRICTIONS: No  FALLS: Has patient fallen in last 6 months? No  LIVING ENVIRONMENT: Lives with: lives alone Lives in: House/apartment Stairs: Yes: Internal: 13 steps; can reach both Has following equipment at home: Single point cane, Walker - 4 wheeled, shower chair, and Grab bars  PLOF: Independent and needing some assistance with driving.  PATIENT GOALS: To get stronger and stop utilizing the walker as much.  OBJECTIVE:   DIAGNOSTIC FINDINGS:   EXAM: NUCLEAR MEDICINE PET SKULL BASE TO THIGH  IMPRESSION: 1. No scintigraphic evidence of residual or recurrent esophageal hypermetabolism. 2. No evidence of hypermetabolic metastatic disease. 3. Similar aneurysmal dilation of the infrarenal abdominal aorta measuring 4.6 cm. Recommend follow-up CT/MR every 6 months and vascular consultation. This recommendation follows ACR consensus guidelines: White Paper of the ACR Incidental Findings Committee II on Vascular Findings. J Am Coll Radiol 2013CJ:[id number]. 4. Aortic Atherosclerosis (ICD10-I70.0) and Emphysema (ICD10-J43.9).  COGNITION: Overall cognitive status: Within functional limits for tasks assessed   SENSATION: WFL  COORDINATION: WFL  POSTURE: No Significant postural limitations   LOWER EXTREMITY MMT:    MMT Right Eval Left Eval  Hip flexion 4 4  Hip abduction 4 4  Hip adduction 4- 4-  Knee flexion 4- 4-  Knee extension 4- 4-  Ankle dorsiflexion 4- 4-  (Blank rows = not tested)  FUNCTIONAL TESTS:  5 times sit to stand: 34.89 sec Timed up and go (TUG): 27.56 sec 6 minute walk test: 37' with use of rollator 10 meter walk test: 32.77 sec Berg Balance Scale: 37/56  PATIENT SURVEYS:  FOTO 45/54  TODAY'S TREATMENT: DATE: 08/09/22    Neuro:  [place] Hard Tux Racer,  Practice mode - Extras, Bronze Set, 6 min 23 sec, 31 herring caught Obstacle course in hallway x3 attempts with step up onto 2 stacked airex pads, climbing ramp and stepping down onto unstable surface and walking across red mat   TherEx:  Seated ankle plantarflexion/dorsiflexion with use of half foam roller for increased ROM, 2x15 each direction Seated resisted ankle dorsiflexion/plantarflexion with GTB for resistance, 2x15 Supine B hamstring stretch, SLR, 30 sec bouts, overpressure applied by therapist Supine B hamstring stretch, bent knee, 30 sec bouts, overpressure applied by therapist Supine B figure 4 stretch, 30 sec bouts, overpressure applied by therapist Supine B piriformis stretch, 30 sec bouts, overpressure applied by therapist    PATIENT EDUCATION: Education details: Pt educated on role of PT and services provided during current POC, along with prognosis and information about the clinic. Person educated: Patient and Child(ren) Education method: Explanation Education comprehension: verbalized understanding  HOME EXERCISE PROGRAM:  Access Code: [email or web address] URL: [email or web address] Date: 07/14/2022 Prepared by: [place]  Exercises - Supine Bridge  - 1 x daily - 7 x weekly -  3 sets - 10 reps - Clamshell  - 1 x daily - 7 x weekly - 3 sets - 10 reps - Sidelying Hip Abduction  - 1 x daily - 7 x weekly - 3 sets - 10 reps - Supine Posterior Pelvic Tilt  - 1 x daily - 7 x weekly - 3 sets - 10 reps - Sidelying Reverse Clamshell  - 1 x daily - 7 x weekly - 3 sets - 10 reps - Active Straight Leg Raise with Quad Set  - 1 x daily - 7 x weekly - 3 sets - 10 reps  GOALS: Goals reviewed with patient? Yes  SHORT TERM GOALS: Target date: 08/09/2022  Pt will be independent with HEP in order to demonstrate increased ability to perform tasks related to occupation/hobbies. Baseline: Pt to be given HEP at next visit Goal status: INITIAL  LONG TERM GOALS: Target date:  10/04/2022  1.  Patient (> 18 years old) will complete five times sit to stand test in < 15 seconds indicating an increased LE strength and improved balance. Baseline: 34.89 sec Goal status: INITIAL  2.  Patient will increase FOTO score to equal to or greater than 54 to demonstrate statistically significant improvement in mobility and quality of life.  Baseline: FOTO: 45 Goal status: INITIAL   3.  Patient will increase Berg Balance score by > 6 points to demonstrate decreased fall risk during functional activities. Baseline: BERG: 37 Goal status: INITIAL   4.  Patient will reduce timed up and go to <11 seconds to reduce fall risk and demonstrate improved transfer/gait ability. Baseline: 27.56 sec Goal status: INITIAL  5.  Patient will increase 10 meter walk test to >1.71ms as to improve gait speed for better community ambulation and to reduce fall risk. Baseline: 32.77 sec Goal status: INITIAL  6.  Patient will increase six minute walk test distance to >1000 for progression to community ambulator and improve gait ability Baseline: 743 with use of rollator Goal status: INITIAL    ASSESSMENT:  CLINICAL IMPRESSION:  Pt performed well with the exercises, however is was noted that the pt was having difficulty with straight leg ankle dorsiflexion, so hamstrings reassessed and found to be significantly tighter.  This prevented the patient from completing the exercise without bending knees and may also be contributing to her difficulty with dorsiflexion while walking.  Will continue to stretch the hamstrings in future sessions and given as part of HEP.  Pt may benefit from performing rocker motion in standing position at parallel bars to assist with proper form when ambulating.   Pt will continue to benefit from skilled therapy to address remaining deficits in order to improve overall QoL and return to PLOF.          OBJECTIVE IMPAIRMENTS: Abnormal gait, decreased activity tolerance,  decreased balance, decreased endurance, decreased knowledge of use of DME, decreased mobility, difficulty walking, and decreased strength.   ACTIVITY LIMITATIONS: carrying, lifting, standing, squatting, stairs, transfers, bed mobility, continence, bathing, toileting, dressing, and locomotion level  PARTICIPATION LIMITATIONS: cleaning, laundry, driving, shopping, community activity, yard work, and church  PERSONAL FACTORS: Age, Fitness, Past/current experiences, and Time since onset of injury/illness/exacerbation are also affecting patient's functional outcome.   REHAB POTENTIAL: Good  CLINICAL DECISION MAKING: Stable/uncomplicated  EVALUATION COMPLEXITY: Low  PLAN:  PT FREQUENCY: 2x/week  PT DURATION: 12 weeks  PLANNED INTERVENTIONS: Therapeutic exercises, Therapeutic activity, Neuromuscular re-education, Balance training, Gait training, Patient/Family education, Self Care, Joint mobilization, Stair training, Vestibular training, Canalith  repositioning, DME instructions, Aquatic Therapy, Dry Needling, Cryotherapy, Moist heat, Taping, Manual therapy, and Re-evaluation  PLAN FOR NEXT SESSION:   Assess rocker pattern in // bars, assess soleus tightness that may be contributing to lack of dorsiflexion  HEP, continue with balance and LE strengthening exercises Potential gait on treadmill with incline   Gwenlyn Saran, PT, DPT Physical Therapist - [facility]  08/09/22, 3:00 PM

## 2022-08-12 ENCOUNTER — Ambulatory Visit: Payer: Medicare PPO | Attending: Internal Medicine

## 2022-08-12 DIAGNOSIS — M6281 Muscle weakness (generalized): Secondary | ICD-10-CM | POA: Diagnosis not present

## 2022-08-12 DIAGNOSIS — R262 Difficulty in walking, not elsewhere classified: Secondary | ICD-10-CM | POA: Insufficient documentation

## 2022-08-12 DIAGNOSIS — R29898 Other symptoms and signs involving the musculoskeletal system: Secondary | ICD-10-CM | POA: Insufficient documentation

## 2022-08-12 NOTE — Therapy (Addendum)
OUTPATIENT PHYSICAL THERAPY NEURO TREATMENT PHYSICAL THERAPY PROGRESS NOTE   Dates of reporting period  07/12/2022 to   08/12/2022     Patient Name: Heather Cooper MRN: [id number] DOB:19-Jan-1931, 89 y.o., female Today's Date: 08/12/2022   PCP: [name], MD REFERRING PROVIDER: Charlaine Dalton, MD  END OF SESSION:  PT End of Session - 08/12/22 1024     Visit Number 10    Number of Visits 24    Date for PT Re-Evaluation 10/04/22    PT Start Time 0934    PT Stop Time 1019    PT Time Calculation (min) 45 min    Equipment Utilized During Treatment Gait belt    Activity Tolerance Patient tolerated treatment well    Behavior During Therapy WFL for tasks assessed/performed               Past Medical History:  Diagnosis Date   Chemotherapy induced nausea and vomiting    Chicken pox    Esophageal cancer ([place])    Hypercalcemia    familial hypocalciuric hypercalcemia   Hypercholesterolemia    Hypertension    Lung cancer ([place])    Osteoporosis    Thyroid disease    Past Surgical History:  Procedure Laterality Date   ABDOMINAL HYSTERECTOMY     partial   CHOLECYSTECTOMY     COLONOSCOPY WITH PROPOFOL N/A 04/17/2017   Procedure: COLONOSCOPY WITH PROPOFOL;  Surgeon: Manya Silvas, MD;  Location: [facility] ENDOSCOPY;  Service: Endoscopy;  Laterality: N/A;   ESOPHAGOGASTRODUODENOSCOPY (EGD) WITH PROPOFOL N/A 04/13/2021   Procedure: ESOPHAGOGASTRODUODENOSCOPY (EGD) WITH PROPOFOL;  Surgeon: [name], MD;  Location: [facility] ENDOSCOPY;  Service: Endoscopy;  Laterality: N/A;   EUS N/A 04/22/2021   Procedure: FULL UPPER ENDOSCOPIC ULTRASOUND (EUS) RADIAL;  Surgeon: Jola Schmidt, MD;  Location: [facility] ENDOSCOPY;  Service: Endoscopy;  Laterality: N/A;  [facility] needed   EUS N/A 10/28/2021   Procedure: UPPER ENDOSCOPIC ULTRASOUND (EUS) LINEAR;  Surgeon: Reita Cliche, MD;  Location: [facility] ENDOSCOPY;  Service: Gastroenterology;  Laterality: N/A;  [facility]   transvaginal  hysterectomy  04/18/05   with anterior colporrhaphy   Patient Active Problem List   Diagnosis Date Noted   Encounter for immunotherapy 04/26/2022   Low TSH level 11/23/2021   PAD (peripheral artery disease) ([place]) 08/28/2021   Emphysema of lung ([place]) 08/28/2021   Esophageal cancer ([place]) 05/30/2021   Diarrhea 05/30/2021   Neoplasm of middle third of esophagus 05/10/2021   Stricture and stenosis of esophagus    Dysphagia 01/22/2021   Type 2 diabetes mellitus with hyperglycemia ([place]) 01/19/2021   COVID-19 virus infection 06/21/2020   Anemia 05/11/2020   Malignant neoplasm of right upper lobe of lung ([place]) 08/07/2019   URI (upper respiratory infection) 09/16/2016   Health care maintenance 05/18/2016   History of lung cancer 02/08/2016   Abdominal aortic aneurysm ([place]) 08/20/2015   Abnormal CXR 03/06/2014   Cough 03/06/2014   Headache(784.0) 03/06/2014   Hyperglycemia 02/07/2013   Hypertension 06/26/2012   Hypercholesterolemia 06/26/2012   Osteoporosis 06/26/2012   Hypercalcemia 06/26/2012   GERD (gastroesophageal reflux disease) 06/26/2012    ONSET DATE: 9 months, 10/2021  REFERRING DIAG: M62.81 (ICD-10-CM) - Muscle weakness (generalized)   THERAPY DIAG:  Difficulty in walking, not elsewhere classified  Muscle weakness (generalized)  Weakness of both hips  Rationale for Evaluation and Treatment: Rehabilitation  SUBJECTIVE:  SUBJECTIVE STATEMENT:  Pt reports that she is feeling good. She has been gaining weight. She also reports that she has been doing a great job with her HEP over the last week.    Pt accompanied by: family member, Daughter [name]  PERTINENT HISTORY:  Pt developed has history of cancer x2, but is in remission.  Pt has been on prednisone for 3 months, but should be off  it in the next few days.  MD feels as though that has contributed to her weakness.  Pt has caregiver that comes over 3x week that helps washing clothes due to having some things on the ground floor.  PAIN:  Are you having pain? No  PRECAUTIONS: Other: Cancer  WEIGHT BEARING RESTRICTIONS: No  FALLS: Has patient fallen in last 6 months? No  LIVING ENVIRONMENT: Lives with: lives alone Lives in: House/apartment Stairs: Yes: Internal: 13 steps; can reach both Has following equipment at home: Single point cane, Walker - 4 wheeled, shower chair, and Grab bars  PLOF: Independent and needing some assistance with driving.  PATIENT GOALS: To get stronger and stop utilizing the walker as much.  OBJECTIVE:   DIAGNOSTIC FINDINGS:   EXAM: NUCLEAR MEDICINE PET SKULL BASE TO THIGH  IMPRESSION: 1. No scintigraphic evidence of residual or recurrent esophageal hypermetabolism. 2. No evidence of hypermetabolic metastatic disease. 3. Similar aneurysmal dilation of the infrarenal abdominal aorta measuring 4.6 cm. Recommend follow-up CT/MR every 6 months and vascular consultation. This recommendation follows ACR consensus guidelines: White Paper of the ACR Incidental Findings Committee II on Vascular Findings. J Am Coll Radiol 2013CJ:[id number]. 4. Aortic Atherosclerosis (ICD10-I70.0) and Emphysema (ICD10-J43.9).  COGNITION: Overall cognitive status: Within functional limits for tasks assessed   SENSATION: WFL  COORDINATION: WFL  POSTURE: No Significant postural limitations   LOWER EXTREMITY MMT:    MMT Right Eval Left Eval  Hip flexion 4 4  Hip abduction 4 4  Hip adduction 4- 4-  Knee flexion 4- 4-  Knee extension 4- 4-  Ankle dorsiflexion 4- 4-  (Blank rows = not tested)  FUNCTIONAL TESTS:  5 times sit to stand: 34.89 sec Timed up and go (TUG): 27.56 sec 6 minute walk test: 17' with use of rollator 10 meter walk test: 32.77 sec Berg Balance Scale: 37/56  PATIENT  SURVEYS:  FOTO 45/54  TODAY'S TREATMENT: DATE: 08/12/22   PT instructed pt in BERG. Patient demonstrates increased fall risk as noted by score of   39/56 on Berg Balance Scale.  (<36= high risk for falls, close to 100%; 37-45 significant >80%; 46-51 moderate >50%; 52-55 lower >25%)  PT instructed pt in TUG: with Rollator) 13.35. (14.1 and 12.6 sec)   14.25 without AD(15.4  and 13.1 sec) >13.5 sec indicates increased fall risk)   6 Min Walk Test:  Instructed patient to ambulate as quickly and as safely as possible for 6 minutes using LRAD. Patient was allowed to take standing rest breaks without stopping the test, but if the patient required a sitting rest break the clock would be stopped and the test would be over.  Results: 79 feet using a Rollator with supervision assist. Results indicate that the patient has reduced endurance with ambulation compared to age matched norms.  Age Matched Norms: 55-69 yo M: 43 F: 76, 43-79 yo M: 47 F: 471, 83-89 yo M: 417 F: 392 MDC: 58.21 meters (190.98 feet) or 50 meters (ANPTA Core Set of Outcome Measures for Adults with Neurologic Conditions, 2018)    Standing  balance training to perform forward rocker step x 15 BLE then stepping over half bolster with emphasis on anterior weigh tshift with each step x 10 BLE UE supported on Rails for each. Min assist for improved anterior weight shift on the LLE and cues for heel contact on the RLE    PATIENT EDUCATION: Education details: Educated pt on progress towards LTG and safe use of AD while in home. Pt educated throughout session about proper posture and technique with exercises. Improved exercise technique, movement at target joints, use of target muscles after min to mod verbal, visual, tactile cues  Person educated: Patient and Child(ren) Education method: Explanation Education comprehension: verbalized understanding  HOME EXERCISE PROGRAM:  Access Code: [email or web address] URL:  [email or web address] Date: 07/14/2022 Prepared by: [place]y [place]  Exercises - Supine Bridge  - 1 x daily - 7 x weekly - 3 sets - 10 reps - Clamshell  - 1 x daily - 7 x weekly - 3 sets - 10 reps - Sidelying Hip Abduction  - 1 x daily - 7 x weekly - 3 sets - 10 reps - Supine Posterior Pelvic Tilt  - 1 x daily - 7 x weekly - 3 sets - 10 reps - Sidelying Reverse Clamshell  - 1 x daily - 7 x weekly - 3 sets - 10 reps - Active Straight Leg Raise with Quad Set  - 1 x daily - 7 x weekly - 3 sets - 10 reps  GOALS: Goals reviewed with patient? Yes  SHORT TERM GOALS: Target date: 08/09/2022  Pt will be independent with HEP in order to demonstrate increased ability to perform tasks related to occupation/hobbies. Baseline: Pt to be given HEP at next visit Goal status: IN PROGRESS  LONG TERM GOALS: Target date: 10/04/2022  1.  Patient (> 67 years old) will complete five times sit to stand test in < 15 seconds indicating an increased LE strength and improved balance. Baseline: 34.89 sec Goal status: IN PROGRESS  2.  Patient will increase FOTO score to equal to or greater than 54 to demonstrate statistically significant improvement in mobility and quality of life.  Baseline: FOTO: 45 Goal status: IN PROGRESS   3.  Patient will increase Berg Balance score by > 6 points to demonstrate decreased fall risk during functional activities. Baseline: BERG: 37  08/12/2022: 39  Goal status: IN PROGRESS   4.  Patient will reduce timed up and go to <11 seconds to reduce fall risk and demonstrate improved transfer/gait ability. Baseline: 27.56 sec 08/12/2022: 12.9 with no AD  Goal status: IN PROGRESS  5.  Patient will increase 10 meter walk test to >1.67ms as to improve gait speed for better community ambulation and to reduce fall risk. Baseline: 32.77 sec Goal status: IN PROGRESS  6.  Patient will increase six minute walk test distance to >1000 for progression to community ambulator and  improve gait ability Baseline: 716 with use of rollator 08/12/2022: 820' Goal status: IN PROGRESS    ASSESSMENT:  CLINICAL IMPRESSION:  Pt did well to perform assessment towards short and long term goals. Improved function noted with increased score on BERG, 6 minute walk test, and TUG. Pt continues to demonstrate decreased step length on BLE with poor hip extension and limitation into ankle PF with limb advancement. Patient's condition has the potential to improve in response to therapy. Maximum improvement is yet to be obtained. The anticipated improvement is attainable and reasonable in a generally predictable time. Will continue to benefit from dynamic  balance training and BLE HS, hip flexor, and gastroc/soleus.      OBJECTIVE IMPAIRMENTS: Abnormal gait, decreased activity tolerance, decreased balance, decreased endurance, decreased knowledge of use of DME, decreased mobility, difficulty walking, and decreased strength.   ACTIVITY LIMITATIONS: carrying, lifting, standing, squatting, stairs, transfers, bed mobility, continence, bathing, toileting, dressing, and locomotion level  PARTICIPATION LIMITATIONS: cleaning, laundry, driving, shopping, community activity, yard work, and church  PERSONAL FACTORS: Age, Fitness, Past/current experiences, and Time since onset of injury/illness/exacerbation are also affecting patient's functional outcome.   REHAB POTENTIAL: Good  CLINICAL DECISION MAKING: Stable/uncomplicated  EVALUATION COMPLEXITY: Low  PLAN:  PT FREQUENCY: 2x/week  PT DURATION: 12 weeks  PLANNED INTERVENTIONS: Therapeutic exercises, Therapeutic activity, Neuromuscular re-education, Balance training, Gait training, Patient/Family education, Self Care, Joint mobilization, Stair training, Vestibular training, Canalith repositioning, DME instructions, Aquatic Therapy, Dry Needling, Cryotherapy, Moist heat, Taping, Manual therapy, and Re-evaluation  PLAN FOR NEXT SESSION:    Assess soleus tightness that may be contributing to lack of dorsiflexion. HEP, continue with balance and LE strengthening exercises. Potential gait on treadmill with incline.     Barrie Folk PT, DPT  Physical Therapist - [facility]  11:08 AM 08/12/22

## 2022-08-16 ENCOUNTER — Ambulatory Visit: Payer: Medicare PPO

## 2022-08-16 DIAGNOSIS — M6281 Muscle weakness (generalized): Secondary | ICD-10-CM | POA: Diagnosis not present

## 2022-08-16 DIAGNOSIS — R29898 Other symptoms and signs involving the musculoskeletal system: Secondary | ICD-10-CM

## 2022-08-16 DIAGNOSIS — R262 Difficulty in walking, not elsewhere classified: Secondary | ICD-10-CM

## 2022-08-16 NOTE — Therapy (Signed)
OUTPATIENT PHYSICAL THERAPY NEURO TREATMENT    Patient Name: Heather Cooper MRN: [id number] DOB:05/27/1931, 89 y.o., female Today's Date: 08/16/2022   PCP: [name], MD REFERRING PROVIDER: Charlaine Dalton, MD  END OF SESSION:  PT End of Session - 08/16/22 0924     Visit Number 11    Number of Visits 24    Date for PT Re-Evaluation 10/04/22    PT Start Time 0930    PT Stop Time 1016    PT Time Calculation (min) 46 min    Equipment Utilized During Treatment Gait belt    Activity Tolerance Patient tolerated treatment well    Behavior During Therapy WFL for tasks assessed/performed               Past Medical History:  Diagnosis Date   Chemotherapy induced nausea and vomiting    Chicken pox    Esophageal cancer ([place])    Hypercalcemia    familial hypocalciuric hypercalcemia   Hypercholesterolemia    Hypertension    Lung cancer ([place])    Osteoporosis    Thyroid disease    Past Surgical History:  Procedure Laterality Date   ABDOMINAL HYSTERECTOMY     partial   CHOLECYSTECTOMY     COLONOSCOPY WITH PROPOFOL N/A 04/17/2017   Procedure: COLONOSCOPY WITH PROPOFOL;  Surgeon: Manya Silvas, MD;  Location: [facility] ENDOSCOPY;  Service: Endoscopy;  Laterality: N/A;   ESOPHAGOGASTRODUODENOSCOPY (EGD) WITH PROPOFOL N/A 04/13/2021   Procedure: ESOPHAGOGASTRODUODENOSCOPY (EGD) WITH PROPOFOL;  Surgeon: [name], MD;  Location: [facility] ENDOSCOPY;  Service: Endoscopy;  Laterality: N/A;   EUS N/A 04/22/2021   Procedure: FULL UPPER ENDOSCOPIC ULTRASOUND (EUS) RADIAL;  Surgeon: Jola Schmidt, MD;  Location: [facility] ENDOSCOPY;  Service: Endoscopy;  Laterality: N/A;  [facility] needed   EUS N/A 10/28/2021   Procedure: UPPER ENDOSCOPIC ULTRASOUND (EUS) LINEAR;  Surgeon: Reita Cliche, MD;  Location: [facility] ENDOSCOPY;  Service: Gastroenterology;  Laterality: N/A;  [facility]   transvaginal hysterectomy  04/18/05   with anterior colporrhaphy   Patient Active Problem List   Diagnosis  Date Noted   Encounter for immunotherapy 04/26/2022   Low TSH level 11/23/2021   PAD (peripheral artery disease) ([place]) 08/28/2021   Emphysema of lung ([place]) 08/28/2021   Esophageal cancer ([place]) 05/30/2021   Diarrhea 05/30/2021   Neoplasm of middle third of esophagus 05/10/2021   Stricture and stenosis of esophagus    Dysphagia 01/22/2021   Type 2 diabetes mellitus with hyperglycemia ([place]) 01/19/2021   COVID-19 virus infection 06/21/2020   Anemia 05/11/2020   Malignant neoplasm of right upper lobe of lung ([place]) 08/07/2019   URI (upper respiratory infection) 09/16/2016   Health care maintenance 05/18/2016   History of lung cancer 02/08/2016   Abdominal aortic aneurysm ([place]) 08/20/2015   Abnormal CXR 03/06/2014   Cough 03/06/2014   Headache(784.0) 03/06/2014   Hyperglycemia 02/07/2013   Hypertension 06/26/2012   Hypercholesterolemia 06/26/2012   Osteoporosis 06/26/2012   Hypercalcemia 06/26/2012   GERD (gastroesophageal reflux disease) 06/26/2012    ONSET DATE: 9 months, 10/2021  REFERRING DIAG: M62.81 (ICD-10-CM) - Muscle weakness (generalized)   THERAPY DIAG:  Difficulty in walking, not elsewhere classified  Muscle weakness (generalized)  Weakness of both hips  Rationale for Evaluation and Treatment: Rehabilitation  SUBJECTIVE:  SUBJECTIVE STATEMENT:  Pt reports that she is doing well, no pain. Had a great weekend. Daughter present throughout entire session.    Pt accompanied by: family member, Daughter [name]  PERTINENT HISTORY:  Pt developed has history of cancer x2, but is in remission.  Pt has been on prednisone for 3 months, but should be off it in the next few days.  MD feels as though that has contributed to her weakness.  Pt has caregiver that comes over 3x week that helps  washing clothes due to having some things on the ground floor.  PAIN:  Are you having pain? No  PRECAUTIONS: Other: Cancer  WEIGHT BEARING RESTRICTIONS: No  FALLS: Has patient fallen in last 6 months? No  LIVING ENVIRONMENT: Lives with: lives alone Lives in: House/apartment Stairs: Yes: Internal: 13 steps; can reach both Has following equipment at home: Single point cane, Walker - 4 wheeled, shower chair, and Grab bars  PLOF: Independent and needing some assistance with driving.  PATIENT GOALS: To get stronger and stop utilizing the walker as much.  OBJECTIVE:   DIAGNOSTIC FINDINGS:   EXAM: NUCLEAR MEDICINE PET SKULL BASE TO THIGH  IMPRESSION: 1. No scintigraphic evidence of residual or recurrent esophageal hypermetabolism. 2. No evidence of hypermetabolic metastatic disease. 3. Similar aneurysmal dilation of the infrarenal abdominal aorta measuring 4.6 cm. Recommend follow-up CT/MR every 6 months and vascular consultation. This recommendation follows ACR consensus guidelines: White Paper of the ACR Incidental Findings Committee II on Vascular Findings. J Am Coll Radiol 2013CJ:[id number]. 4. Aortic Atherosclerosis (ICD10-I70.0) and Emphysema (ICD10-J43.9).  COGNITION: Overall cognitive status: Within functional limits for tasks assessed   SENSATION: WFL  COORDINATION: WFL  POSTURE: No Significant postural limitations   LOWER EXTREMITY MMT:    MMT Right Eval Left Eval  Hip flexion 4 4  Hip abduction 4 4  Hip adduction 4- 4-  Knee flexion 4- 4-  Knee extension 4- 4-  Ankle dorsiflexion 4- 4-  (Blank rows = not tested)  FUNCTIONAL TESTS:  5 times sit to stand: 34.89 sec Timed up and go (TUG): 27.56 sec 6 minute walk test: 4' with use of rollator 10 meter walk test: 32.77 sec Berg Balance Scale: 37/56  PATIENT SURVEYS:  FOTO 45/54  TODAY'S TREATMENT: DATE: 08/16/22   Pt performed Nustep level 3 x 5 min while subjective assessment performed.  Pt reports moderate fatigue with difficulty with understanding RPE   Pt performed dynamic balance training in parallel bars:  Lateral stepping R and L without resistance 2 x 10 ft bil and with red tband 2 x 62f. Cues from PT to maintain partial squat to increase gluteal engagement.  Forward/reverse gait stepping in agility ladder with red tband on thighs x 4 each and lateral stepping with BLE in each space in agility ladder performed x 3 bil  Min assist from PT to prevent pelvic rotation and instruction from PT to increase step length on BLE L>R  Gait training with SPC lap around rehab gym x [id number] reverse supervision assist with forward gait training CGA/min assist to prevent compensatory movement into R pelvic and trunkal rotation.  Additional gait without AD x [id number] supervision assist.  Cues for increased step length throughout gait training with intermittent improvement RLE>LLE.   PT educated pt in HEP with hand out provided for hip flexor, gastroc, and soleus stretch. See below for details     PATIENT EDUCATION: Education details:  Pt educated throughout session  about proper posture and technique with exercises. Improved exercise technique, movement at target joints, use of target muscles after min to mod verbal, visual, tactile cues   Person educated: Patient and Child(ren) Education method: Explanation Education comprehension: verbalized understanding  HOME EXERCISE PROGRAM:  Access Code: [email or web address] URL: [email or web address] Date: 08/16/2022 Prepared by: Barrie Folk  Exercises - Seated Calf Stretch with Strap  - 1 x daily - 7 x weekly - 3 sets - 10 reps - Seated Calf Stretch knee bent with Strap  - 1 x daily - 7 x weekly - 3 sets - 10 reps - Standing Hip Flexor Stretch  - 1 x daily - 7 x weekly - 3 sets - 10 reps  Access Code: [email or web address] URL: [email or web address] Date: 07/14/2022 Prepared by:  [place]  Exercises - Supine Bridge  - 1 x daily - 7 x weekly - 3 sets - 10 reps - Clamshell  - 1 x daily - 7 x weekly - 3 sets - 10 reps - Sidelying Hip Abduction  - 1 x daily - 7 x weekly - 3 sets - 10 reps - Supine Posterior Pelvic Tilt  - 1 x daily - 7 x weekly - 3 sets - 10 reps - Sidelying Reverse Clamshell  - 1 x daily - 7 x weekly - 3 sets - 10 reps - Active Straight Leg Raise with Quad Set  - 1 x daily - 7 x weekly - 3 sets - 10 reps  GOALS: Goals reviewed with patient? Yes  SHORT TERM GOALS: Target date: 08/09/2022  Pt will be independent with HEP in order to demonstrate increased ability to perform tasks related to occupation/hobbies. Baseline: Pt to be given HEP at next visit Goal status: IN PROGRESS  LONG TERM GOALS: Target date: 10/04/2022  1.  Patient (> 44 years old) will complete five times sit to stand test in < 15 seconds indicating an increased LE strength and improved balance. Baseline: 34.89 sec Goal status: IN PROGRESS  2.  Patient will increase FOTO score to equal to or greater than 54 to demonstrate statistically significant improvement in mobility and quality of life.  Baseline: FOTO: 45 Goal status: IN PROGRESS   3.  Patient will increase Berg Balance score by > 6 points to demonstrate decreased fall risk during functional activities. Baseline: BERG: 37  08/12/2022: 39  Goal status: IN PROGRESS   4.  Patient will reduce timed up and go to <11 seconds to reduce fall risk and demonstrate improved transfer/gait ability. Baseline: 27.56 sec 08/12/2022: 12.9 with no AD  Goal status: IN PROGRESS  5.  Patient will increase 10 meter walk test to >1.49ms as to improve gait speed for better community ambulation and to reduce fall risk. Baseline: 32.77 sec Goal status: IN PROGRESS  6.  Patient will increase six minute walk test distance to >1000 for progression to community ambulator and improve gait ability Baseline: 781 with use of rollator 08/12/2022:  820' Goal status: IN PROGRESS    ASSESSMENT:  CLINICAL IMPRESSION:  PT treatment focused on dynamic balance and gait training. PT performed all interventions well throughout session with emphasis on increased step length and activation of hip musculature. Pt tolerated stretching HEP to allow improved step length on BLE. Will benefit from continued strength endurance and balance training as well as continued skilled therapy to address remaining deficits in order to improve overall QoL and return to PLOF.       OBJECTIVE IMPAIRMENTS: Abnormal gait, decreased activity tolerance,  decreased balance, decreased endurance, decreased knowledge of use of DME, decreased mobility, difficulty walking, and decreased strength.   ACTIVITY LIMITATIONS: carrying, lifting, standing, squatting, stairs, transfers, bed mobility, continence, bathing, toileting, dressing, and locomotion level  PARTICIPATION LIMITATIONS: cleaning, laundry, driving, shopping, community activity, yard work, and church  PERSONAL FACTORS: Age, Fitness, Past/current experiences, and Time since onset of injury/illness/exacerbation are also affecting patient's functional outcome.   REHAB POTENTIAL: Good  CLINICAL DECISION MAKING: Stable/uncomplicated  EVALUATION COMPLEXITY: Low  PLAN:  PT FREQUENCY: 2x/week  PT DURATION: 12 weeks  PLANNED INTERVENTIONS: Therapeutic exercises, Therapeutic activity, Neuromuscular re-education, Balance training, Gait training, Patient/Family education, Self Care, Joint mobilization, Stair training, Vestibular training, Canalith repositioning, DME instructions, Aquatic Therapy, Dry Needling, Cryotherapy, Moist heat, Taping, Manual therapy, and Re-evaluation  PLAN FOR NEXT SESSION:   Continue to perform dynamic balance and gait training as well as address LLE>RLE weakness in hip. Potential gait on treadmill with incline.     Barrie Folk PT, DPT  Physical Therapist - [facility]  10:48 AM 08/16/22

## 2022-08-19 ENCOUNTER — Ambulatory Visit: Payer: Medicare PPO | Admitting: Physical Therapy

## 2022-08-19 DIAGNOSIS — M6281 Muscle weakness (generalized): Secondary | ICD-10-CM

## 2022-08-19 DIAGNOSIS — R262 Difficulty in walking, not elsewhere classified: Secondary | ICD-10-CM | POA: Diagnosis not present

## 2022-08-19 DIAGNOSIS — R29898 Other symptoms and signs involving the musculoskeletal system: Secondary | ICD-10-CM

## 2022-08-19 NOTE — Therapy (Signed)
OUTPATIENT PHYSICAL THERAPY NEURO TREATMENT    Patient Name: Heather Cooper MRN: [id number] DOB:February 17, 1931, 89 y.o., female Today's Date: 08/19/2022   PCP: [name], MD REFERRING PROVIDER: Charlaine Dalton, MD  END OF SESSION:  PT End of Session - 08/19/22 1024     Visit Number 12    Number of Visits 24    Date for PT Re-Evaluation 10/04/22    PT Start Time 1021    PT Stop Time 1103    PT Time Calculation (min) 42 min    Equipment Utilized During Treatment Gait belt    Activity Tolerance Patient tolerated treatment well    Behavior During Therapy WFL for tasks assessed/performed               Past Medical History:  Diagnosis Date   Chemotherapy induced nausea and vomiting    Chicken pox    Esophageal cancer ([place])    Hypercalcemia    familial hypocalciuric hypercalcemia   Hypercholesterolemia    Hypertension    Lung cancer ([place])    Osteoporosis    Thyroid disease    Past Surgical History:  Procedure Laterality Date   ABDOMINAL HYSTERECTOMY     partial   CHOLECYSTECTOMY     COLONOSCOPY WITH PROPOFOL N/A 04/17/2017   Procedure: COLONOSCOPY WITH PROPOFOL;  Surgeon: Manya Silvas, MD;  Location: [facility] ENDOSCOPY;  Service: Endoscopy;  Laterality: N/A;   ESOPHAGOGASTRODUODENOSCOPY (EGD) WITH PROPOFOL N/A 04/13/2021   Procedure: ESOPHAGOGASTRODUODENOSCOPY (EGD) WITH PROPOFOL;  Surgeon: [name], MD;  Location: [facility] ENDOSCOPY;  Service: Endoscopy;  Laterality: N/A;   EUS N/A 04/22/2021   Procedure: FULL UPPER ENDOSCOPIC ULTRASOUND (EUS) RADIAL;  Surgeon: Jola Schmidt, MD;  Location: [facility] ENDOSCOPY;  Service: Endoscopy;  Laterality: N/A;  [facility] needed   EUS N/A 10/28/2021   Procedure: UPPER ENDOSCOPIC ULTRASOUND (EUS) LINEAR;  Surgeon: Reita Cliche, MD;  Location: [facility] ENDOSCOPY;  Service: Gastroenterology;  Laterality: N/A;  [facility]   transvaginal hysterectomy  04/18/05   with anterior colporrhaphy   Patient Active Problem List   Diagnosis  Date Noted   Encounter for immunotherapy 04/26/2022   Low TSH level 11/23/2021   PAD (peripheral artery disease) ([place]) 08/28/2021   Emphysema of lung ([place]) 08/28/2021   Esophageal cancer ([place]) 05/30/2021   Diarrhea 05/30/2021   Neoplasm of middle third of esophagus 05/10/2021   Stricture and stenosis of esophagus    Dysphagia 01/22/2021   Type 2 diabetes mellitus with hyperglycemia ([place]) 01/19/2021   COVID-19 virus infection 06/21/2020   Anemia 05/11/2020   Malignant neoplasm of right upper lobe of lung ([place]) 08/07/2019   URI (upper respiratory infection) 09/16/2016   Health care maintenance 05/18/2016   History of lung cancer 02/08/2016   Abdominal aortic aneurysm ([place]) 08/20/2015   Abnormal CXR 03/06/2014   Cough 03/06/2014   Headache(784.0) 03/06/2014   Hyperglycemia 02/07/2013   Hypertension 06/26/2012   Hypercholesterolemia 06/26/2012   Osteoporosis 06/26/2012   Hypercalcemia 06/26/2012   GERD (gastroesophageal reflux disease) 06/26/2012    ONSET DATE: 9 months, 10/2021  REFERRING DIAG: M62.81 (ICD-10-CM) - Muscle weakness (generalized)   THERAPY DIAG:  Difficulty in walking, not elsewhere classified  Muscle weakness (generalized)  Weakness of both hips  Rationale for Evaluation and Treatment: Rehabilitation  SUBJECTIVE:  SUBJECTIVE STATEMENT:  Pt reports that she is doing well, no pain. No new issue. Accompanied by daughter to PT treatment on this day. Pt reports feeling stronger in BLE since staring PT treatment.    Pt accompanied by: family member, Daughter [name]  PERTINENT HISTORY:  Pt developed has history of cancer x2, but is in remission.  Pt has been on prednisone for 3 months, but should be off it in the next few days.  MD feels as though that has contributed to her  weakness.  Pt has caregiver that comes over 3x week that helps washing clothes due to having some things on the ground floor.  PAIN:  Are you having pain? No  PRECAUTIONS: Other: Cancer  WEIGHT BEARING RESTRICTIONS: No  FALLS: Has patient fallen in last 6 months? No  LIVING ENVIRONMENT: Lives with: lives alone Lives in: House/apartment Stairs: Yes: Internal: 13 steps; can reach both Has following equipment at home: Single point cane, Walker - 4 wheeled, shower chair, and Grab bars  PLOF: Independent and needing some assistance with driving.  PATIENT GOALS: To get stronger and stop utilizing the walker as much.  OBJECTIVE:   DIAGNOSTIC FINDINGS:   EXAM: NUCLEAR MEDICINE PET SKULL BASE TO THIGH  IMPRESSION: 1. No scintigraphic evidence of residual or recurrent esophageal hypermetabolism. 2. No evidence of hypermetabolic metastatic disease. 3. Similar aneurysmal dilation of the infrarenal abdominal aorta measuring 4.6 cm. Recommend follow-up CT/MR every 6 months and vascular consultation. This recommendation follows ACR consensus guidelines: White Paper of the ACR Incidental Findings Committee II on Vascular Findings. J Am Coll Radiol [id number]:[id number]. 4. Aortic Atherosclerosis (ICD10-I70.0) and Emphysema (ICD10-J43.9).  COGNITION: Overall cognitive status: Within functional limits for tasks assessed   SENSATION: WFL  COORDINATION: WFL  POSTURE: No Significant postural limitations   LOWER EXTREMITY MMT:    MMT Right Eval Left Eval  Hip flexion 4 4  Hip abduction 4 4  Hip adduction 4- 4-  Knee flexion 4- 4-  Knee extension 4- 4-  Ankle dorsiflexion 4- 4-  (Blank rows = not tested)  FUNCTIONAL TESTS:  5 times sit to stand: 34.89 sec Timed up and go (TUG): 27.56 sec 6 minute walk test: 20' with use of rollator 10 meter walk test: 32.77 sec Berg Balance Scale: 37/56  PATIENT SURVEYS:  FOTO 45/54  TODAY'S TREATMENT: DATE: 08/19/22   Pt performed  nustep endurance training x 3 min while Subjective assessment performed.   Reassess HEP provided at last treatment session Heel cord stretches x 30 bil and hip flexor stretch x 30 bil. Min instruction for proper set up to activate target musculature.   Dynamic balance/gait training:  Weave through 6 cones on floor x 6 with CGA-min assist from PT for safety with assist for weight shifting to allow increased step length on the LLE.  Side stepping with no UE support x 42f x2 bil min assist for safety with min cues to prevent anterior bias.  Forward/reverse gait 163fx 3 with min assist from PT and instruction for improved step length on the LLE. Noted   Therex: Supine bridge x 10 3 sec hold.  Supine clam shell GTB x 10 with 3 sec hold SLR with 2 sec hold x 10  SAQ with 4#ankle weight x 10 with 3 sec hold  Supine hip abduction/addustion x 10 with 4#ankle weight  Isometric hip adduction to squeez ball x 10 with 3 sec hold.  Instruction from PT to improve hold at end range and  decrease speed of eccentric movement to improve target muscle activation.   PATIENT EDUCATION: Education details:  Pt educated throughout session about proper posture and technique with exercises. Improved exercise technique, movement at target joints, use of target muscles after min to mod verbal, visual, tactile cues   Person educated: Patient and Child(ren) Education method: Explanation Education comprehension: verbalized understanding  HOME EXERCISE PROGRAM:  Access Code: [email or web address] URL: [email or web address] Date: 08/16/2022 Prepared by: Barrie Folk  Exercises - Seated Calf Stretch with Strap  - 1 x daily - 7 x weekly - 3 sets - 10 reps - Seated Calf Stretch knee bent with Strap  - 1 x daily - 7 x weekly - 3 sets - 10 reps - Standing Hip Flexor Stretch  - 1 x daily - 7 x weekly - 3 sets - 10 reps  Access Code: [email or web address] URL: [email or web address] Date: 07/14/2022 Prepared by:  Washburn Nation  Exercises - Supine Bridge  - 1 x daily - 7 x weekly - 3 sets - 10 reps - Clamshell  - 1 x daily - 7 x weekly - 3 sets - 10 reps - Sidelying Hip Abduction  - 1 x daily - 7 x weekly - 3 sets - 10 reps - Supine Posterior Pelvic Tilt  - 1 x daily - 7 x weekly - 3 sets - 10 reps - Sidelying Reverse Clamshell  - 1 x daily - 7 x weekly - 3 sets - 10 reps - Active Straight Leg Raise with Quad Set  - 1 x daily - 7 x weekly - 3 sets - 10 reps  GOALS: Goals reviewed with patient? Yes  SHORT TERM GOALS: Target date: 08/09/2022  Pt will be independent with HEP in order to demonstrate increased ability to perform tasks related to occupation/hobbies. Baseline: Pt to be given HEP at next visit Goal status: IN PROGRESS  LONG TERM GOALS: Target date: 10/04/2022  1.  Patient (> 22 years old) will complete five times sit to stand test in < 15 seconds indicating an increased LE strength and improved balance. Baseline: 34.89 sec Goal status: IN PROGRESS  2.  Patient will increase FOTO score to equal to or greater than 54 to demonstrate statistically significant improvement in mobility and quality of life.  Baseline: FOTO: 45 Goal status: IN PROGRESS   3.  Patient will increase Berg Balance score by > 6 points to demonstrate decreased fall risk during functional activities. Baseline: BERG: 37  08/12/2022: 39  Goal status: IN PROGRESS   4.  Patient will reduce timed up and go to <11 seconds to reduce fall risk and demonstrate improved transfer/gait ability. Baseline: 27.56 sec 08/12/2022: 12.9 with no AD  Goal status: IN PROGRESS  5.  Patient will increase 10 meter walk test to >1.2ms as to improve gait speed for better community ambulation and to reduce fall risk. Baseline: 32.77 sec Goal status: IN PROGRESS  6.  Patient will increase six minute walk test distance to >1000 for progression to community ambulator and improve gait ability Baseline: 744 with use of rollator 08/12/2022:  820' Goal status: IN PROGRESS    ASSESSMENT:  CLINICAL IMPRESSION:  PT treatment focused on dynamic balance/gait training and generalized strengthening. Pt put forth good effort throughout session with mild LOB with dynamic gait training, but able to demonstrated improved gait pattern with instruction from PT. Tolerated strengthening exercises well with no pain reported. Will benefit from continued strength endurance and balance training as well as continued skilled therapy to  address remaining deficits in order to improve overall QoL and return to PLOF.       OBJECTIVE IMPAIRMENTS: Abnormal gait, decreased activity tolerance, decreased balance, decreased endurance, decreased knowledge of use of DME, decreased mobility, difficulty walking, and decreased strength.   ACTIVITY LIMITATIONS: carrying, lifting, standing, squatting, stairs, transfers, bed mobility, continence, bathing, toileting, dressing, and locomotion level  PARTICIPATION LIMITATIONS: cleaning, laundry, driving, shopping, community activity, yard work, and church  PERSONAL FACTORS: Age, Fitness, Past/current experiences, and Time since onset of injury/illness/exacerbation are also affecting patient's functional outcome.   REHAB POTENTIAL: Good  CLINICAL DECISION MAKING: Stable/uncomplicated  EVALUATION COMPLEXITY: Low  PLAN:  PT FREQUENCY: 2x/week  PT DURATION: 12 weeks  PLANNED INTERVENTIONS: Therapeutic exercises, Therapeutic activity, Neuromuscular re-education, Balance training, Gait training, Patient/Family education, Self Care, Joint mobilization, Stair training, Vestibular training, Canalith repositioning, DME instructions, Aquatic Therapy, Dry Needling, Cryotherapy, Moist heat, Taping, Manual therapy, and Re-evaluation  PLAN FOR NEXT SESSION:   Continue to progress dynamic balance and gait training as well as address LLE>RLE weakness in hip. Potential gait on treadmill with incline.     Barrie Folk PT,  DPT  Physical Therapist - [facility]  11:06 AM 08/19/22

## 2022-08-22 ENCOUNTER — Ambulatory Visit: Payer: Medicare PPO

## 2022-08-23 ENCOUNTER — Ambulatory Visit: Payer: Medicare PPO | Admitting: Physical Therapy

## 2022-08-23 DIAGNOSIS — R262 Difficulty in walking, not elsewhere classified: Secondary | ICD-10-CM | POA: Diagnosis not present

## 2022-08-23 DIAGNOSIS — R29898 Other symptoms and signs involving the musculoskeletal system: Secondary | ICD-10-CM

## 2022-08-23 DIAGNOSIS — M6281 Muscle weakness (generalized): Secondary | ICD-10-CM

## 2022-08-23 NOTE — Therapy (Signed)
OUTPATIENT PHYSICAL THERAPY NEURO TREATMENT    Patient Name: Heather Cooper MRN: [id number] DOB:[birth year], 89 y.o., female Today's Date: 08/23/2022   PCP: [name], MD REFERRING PROVIDER: Charlaine Dalton, MD  END OF SESSION:  PT End of Session - 08/23/22 0937     Visit Number 13    Number of Visits 24    Date for PT Re-Evaluation 10/04/22    PT Start Time 0932    PT Stop Time 1016    PT Time Calculation (min) 44 min    Equipment Utilized During Treatment Gait belt    Activity Tolerance Patient tolerated treatment well    Behavior During Therapy WFL for tasks assessed/performed                Past Medical History:  Diagnosis Date   Chemotherapy induced nausea and vomiting    Chicken pox    Esophageal cancer ([place])    Hypercalcemia    familial hypocalciuric hypercalcemia   Hypercholesterolemia    Hypertension    Lung cancer ([place])    Osteoporosis    Thyroid disease    Past Surgical History:  Procedure Laterality Date   ABDOMINAL HYSTERECTOMY     partial   CHOLECYSTECTOMY     COLONOSCOPY WITH PROPOFOL N/A 04/17/2017   Procedure: COLONOSCOPY WITH PROPOFOL;  Surgeon: Manya Silvas, MD;  Location: [facility] ENDOSCOPY;  Service: Endoscopy;  Laterality: N/A;   ESOPHAGOGASTRODUODENOSCOPY (EGD) WITH PROPOFOL N/A 04/13/2021   Procedure: ESOPHAGOGASTRODUODENOSCOPY (EGD) WITH PROPOFOL;  Surgeon: [name], MD;  Location: [facility] ENDOSCOPY;  Service: Endoscopy;  Laterality: N/A;   EUS N/A 04/22/2021   Procedure: FULL UPPER ENDOSCOPIC ULTRASOUND (EUS) RADIAL;  Surgeon: Jola Schmidt, MD;  Location: [facility] ENDOSCOPY;  Service: Endoscopy;  Laterality: N/A;  [facility] needed   EUS N/A 10/28/2021   Procedure: UPPER ENDOSCOPIC ULTRASOUND (EUS) LINEAR;  Surgeon: Reita Cliche, MD;  Location: [facility] ENDOSCOPY;  Service: Gastroenterology;  Laterality: N/A;  [facility]   transvaginal hysterectomy  04/18/05   with anterior colporrhaphy   Patient Active Problem List   Diagnosis  Date Noted   Encounter for immunotherapy 04/26/2022   Low TSH level 11/23/2021   PAD (peripheral artery disease) ([place]) 08/28/2021   Emphysema of lung ([place]) 08/28/2021   Esophageal cancer ([place]) 05/30/2021   Diarrhea 05/30/2021   Neoplasm of middle third of esophagus 05/10/2021   Stricture and stenosis of esophagus    Dysphagia 01/22/2021   Type 2 diabetes mellitus with hyperglycemia ([place]) 01/19/2021   COVID-19 virus infection 06/21/2020   Anemia 05/11/2020   Malignant neoplasm of right upper lobe of lung ([place]) 08/07/2019   URI (upper respiratory infection) 09/16/2016   Health care maintenance 05/18/2016   History of lung cancer 02/08/2016   Abdominal aortic aneurysm ([place]) 08/20/2015   Abnormal CXR 03/06/2014   Cough 03/06/2014   Headache(784.0) 03/06/2014   Hyperglycemia 02/07/2013   Hypertension 06/26/2012   Hypercholesterolemia 06/26/2012   Osteoporosis 06/26/2012   Hypercalcemia 06/26/2012   GERD (gastroesophageal reflux disease) 06/26/2012    ONSET DATE: 9 months, 10/2021  REFERRING DIAG: M62.81 (ICD-10-CM) - Muscle weakness (generalized)   THERAPY DIAG:  Muscle weakness (generalized)  Difficulty in walking, not elsewhere classified  Weakness of both hips  Rationale for Evaluation and Treatment: Rehabilitation  SUBJECTIVE:  SUBJECTIVE STATEMENT:  Pt reports that she is doing well. Reports having a good weekend. Reports feeling stronger in BLE each week. No new complaints of pain on this day.    Pt accompanied by: family member, Son  PERTINENT HISTORY:  Pt developed has history of cancer x2, but is in remission.  Pt has been on prednisone for 3 months, but should be off it in the next few days.  MD feels as though that has contributed to her weakness.  Pt has caregiver that comes  over 3x week that helps washing clothes due to having some things on the ground floor.  PAIN:  Are you having pain? No  PRECAUTIONS: Other: Cancer  WEIGHT BEARING RESTRICTIONS: No  FALLS: Has patient fallen in last 6 months? No  LIVING ENVIRONMENT: Lives with: lives alone Lives in: House/apartment Stairs: Yes: Internal: 13 steps; can reach both Has following equipment at home: Single point cane, Walker - 4 wheeled, shower chair, and Grab bars  PLOF: Independent and needing some assistance with driving.  PATIENT GOALS: To get stronger and stop utilizing the walker as much.  OBJECTIVE:   DIAGNOSTIC FINDINGS:   EXAM: NUCLEAR MEDICINE PET SKULL BASE TO THIGH  IMPRESSION: 1. No scintigraphic evidence of residual or recurrent esophageal hypermetabolism. 2. No evidence of hypermetabolic metastatic disease. 3. Similar aneurysmal dilation of the infrarenal abdominal aorta measuring 4.6 cm. Recommend follow-up CT/MR every 6 months and vascular consultation. This recommendation follows ACR consensus guidelines: White Paper of the ACR Incidental Findings Committee II on Vascular Findings. J Am Coll Radiol [id number]. 4. Aortic Atherosclerosis (ICD10-I70.0) and Emphysema (ICD10-J43.9).  COGNITION: Overall cognitive status: Within functional limits for tasks assessed   SENSATION: WFL  COORDINATION: WFL  POSTURE: No Significant postural limitations   LOWER EXTREMITY MMT:    MMT Right Eval Left Eval  Hip flexion 4 4  Hip abduction 4 4  Hip adduction 4- 4-  Knee flexion 4- 4-  Knee extension 4- 4-  Ankle dorsiflexion 4- 4-  (Blank rows = not tested)  FUNCTIONAL TESTS:  5 times sit to stand: 34.89 sec Timed up and go (TUG): 27.56 sec 6 minute walk test: 48' with use of rollator 10 meter walk test: 32.77 sec Berg Balance Scale: 37/56  PATIENT SURVEYS:  FOTO 45/54  TODAY'S TREATMENT: DATE: 08/23/22   Pt performed nustep endurance training x 4 min while  Subjective assessment performed level 4 throughout. Cues for full ROM on BLE and BUE as well as improved amplitude of movements.   Therex:  Stand with ball raise over head with kickball x 10 and 2KG ball x 10 Partial lateral lunge 2x 10 with 2kg ball.    Seated therex:  Hip abduction x 12 with, RTB,  Reciprocal march x `12, RTB LAQ x 12 with RTB Heel raise/toe raise 2 x 15 with 2KGball between knees  Gait with red tband on thighs forward/reverse with rollator 3 x 91f and no AD 3 x 140f Cues to increase step length with retroversion and to maintain wide BOS. Additional gait training with no AD x [id number] supervision assist and min verbal instruction for increased step length and improved step length/stance time symmetry by decreased force of heel contact on the LLE. Noted improvement in glute med activation following instruction from PT.     PATIENT EDUCATION: Education details:  Pt educated throughout session about proper posture and technique with exercises. Improved exercise technique, movement at target joints, use of target muscles after min  to mod verbal, visual, tactile cues   Person educated: Patient and Child(ren) Education method: Explanation Education comprehension: verbalized understanding  HOME EXERCISE PROGRAM:  Access Code: [email or web address] URL: [email or web address] Date: 08/16/2022 Prepared by: Barrie Folk  Exercises - Seated Calf Stretch with Strap  - 1 x daily - 7 x weekly - 3 sets - 10 reps - Seated Calf Stretch knee bent with Strap  - 1 x daily - 7 x weekly - 3 sets - 10 reps - Standing Hip Flexor Stretch  - 1 x daily - 7 x weekly - 3 sets - 10 reps  Access Code: [email or web address] URL: [email or web address] Date: 07/14/2022 Prepared by: Kirby Nation  Exercises - Supine Bridge  - 1 x daily - 7 x weekly - 3 sets - 10 reps - Clamshell  - 1 x daily - 7 x weekly - 3 sets - 10 reps - Sidelying Hip Abduction  - 1 x daily - 7 x weekly - 3 sets -  10 reps - Supine Posterior Pelvic Tilt  - 1 x daily - 7 x weekly - 3 sets - 10 reps - Sidelying Reverse Clamshell  - 1 x daily - 7 x weekly - 3 sets - 10 reps - Active Straight Leg Raise with Quad Set  - 1 x daily - 7 x weekly - 3 sets - 10 reps  GOALS: Goals reviewed with patient? Yes  SHORT TERM GOALS: Target date: 08/09/2022  Pt will be independent with HEP in order to demonstrate increased ability to perform tasks related to occupation/hobbies. Baseline: Pt to be given HEP at next visit Goal status: IN PROGRESS  LONG TERM GOALS: Target date: 10/04/2022  1.  Patient (> 35 years old) will complete five times sit to stand test in < 15 seconds indicating an increased LE strength and improved balance. Baseline: 34.89 sec Goal status: IN PROGRESS  2.  Patient will increase FOTO score to equal to or greater than 54 to demonstrate statistically significant improvement in mobility and quality of life.  Baseline: FOTO: 45 Goal status: IN PROGRESS   3.  Patient will increase Berg Balance score by > 6 points to demonstrate decreased fall risk during functional activities. Baseline: BERG: 37  08/12/2022: 39  Goal status: IN PROGRESS   4.  Patient will reduce timed up and go to <11 seconds to reduce fall risk and demonstrate improved transfer/gait ability. Baseline: 27.56 sec 08/12/2022: 12.9 with no AD  Goal status: IN PROGRESS  5.  Patient will increase 10 meter walk test to >1.81ms as to improve gait speed for better community ambulation and to reduce fall risk. Baseline: 32.77 sec Goal status: IN PROGRESS  6.  Patient will increase six minute walk test distance to >1000 for progression to community ambulator and improve gait ability Baseline: 725 with use of rollator 08/12/2022: 820' Goal status: IN PROGRESS    ASSESSMENT:  CLINICAL IMPRESSION:  PT treatment focused on gait training and generalized strengthening on this day. Pt put forth good effort throughout session towards  strengthening interventions, and noted to have improved gait pattern with increased step length and symmetry of stance time following interventions. Will benefit from continued strength endurance and balance training as well as continued skilled therapy to address remaining deficits in order to improve overall QoL and return to PLOF.       OBJECTIVE IMPAIRMENTS: Abnormal gait, decreased activity tolerance, decreased balance, decreased endurance, decreased knowledge of use of DME, decreased mobility, difficulty walking, and decreased strength.  ACTIVITY LIMITATIONS: carrying, lifting, standing, squatting, stairs, transfers, bed mobility, continence, bathing, toileting, dressing, and locomotion level  PARTICIPATION LIMITATIONS: cleaning, laundry, driving, shopping, community activity, yard work, and church  PERSONAL FACTORS: Age, Fitness, Past/current experiences, and Time since onset of injury/illness/exacerbation are also affecting patient's functional outcome.   REHAB POTENTIAL: Good  CLINICAL DECISION MAKING: Stable/uncomplicated  EVALUATION COMPLEXITY: Low  PLAN:  PT FREQUENCY: 2x/week  PT DURATION: 12 weeks  PLANNED INTERVENTIONS: Therapeutic exercises, Therapeutic activity, Neuromuscular re-education, Balance training, Gait training, Patient/Family education, Self Care, Joint mobilization, Stair training, Vestibular training, Canalith repositioning, DME instructions, Aquatic Therapy, Dry Needling, Cryotherapy, Moist heat, Taping, Manual therapy, and Re-evaluation  PLAN FOR NEXT SESSION:   Continue to progress BLE strengthening program.  dynamic balance and gait training. Potential gait on treadmill with incline.     Barrie Folk PT, DPT  Physical Therapist - [facility]  10:58 AM 08/23/22

## 2022-08-25 ENCOUNTER — Ambulatory Visit: Payer: Medicare PPO | Admitting: Physical Therapy

## 2022-08-25 DIAGNOSIS — R29898 Other symptoms and signs involving the musculoskeletal system: Secondary | ICD-10-CM

## 2022-08-25 DIAGNOSIS — M6281 Muscle weakness (generalized): Secondary | ICD-10-CM

## 2022-08-25 DIAGNOSIS — R262 Difficulty in walking, not elsewhere classified: Secondary | ICD-10-CM | POA: Diagnosis not present

## 2022-08-25 NOTE — Therapy (Signed)
OUTPATIENT PHYSICAL THERAPY NEURO TREATMENT    Patient Name: Heather Cooper MRN: [id number] DOB:May 14, 1931, 89 y.o., female Today's Date: 08/25/2022   PCP: [name], MD REFERRING PROVIDER: Charlaine Dalton, MD  END OF SESSION:  PT End of Session - 08/25/22 1101     Visit Number 14    Number of Visits 24    Date for PT Re-Evaluation 10/04/22    Progress Note Due on Visit 20    PT Start Time 1102    PT Stop Time 1148    PT Time Calculation (min) 46 min    Equipment Utilized During Treatment Gait belt    Activity Tolerance Patient tolerated treatment well    Behavior During Therapy WFL for tasks assessed/performed                Past Medical History:  Diagnosis Date   Chemotherapy induced nausea and vomiting    Chicken pox    Esophageal cancer ([place])    Hypercalcemia    familial hypocalciuric hypercalcemia   Hypercholesterolemia    Hypertension    Lung cancer ([place])    Osteoporosis    Thyroid disease    Past Surgical History:  Procedure Laterality Date   ABDOMINAL HYSTERECTOMY     partial   CHOLECYSTECTOMY     COLONOSCOPY WITH PROPOFOL N/A 04/17/2017   Procedure: COLONOSCOPY WITH PROPOFOL;  Surgeon: Manya Silvas, MD;  Location: [facility] ENDOSCOPY;  Service: Endoscopy;  Laterality: N/A;   ESOPHAGOGASTRODUODENOSCOPY (EGD) WITH PROPOFOL N/A 04/13/2021   Procedure: ESOPHAGOGASTRODUODENOSCOPY (EGD) WITH PROPOFOL;  Surgeon: [name], MD;  Location: [facility] ENDOSCOPY;  Service: Endoscopy;  Laterality: N/A;   EUS N/A 04/22/2021   Procedure: FULL UPPER ENDOSCOPIC ULTRASOUND (EUS) RADIAL;  Surgeon: Jola Schmidt, MD;  Location: [facility] ENDOSCOPY;  Service: Endoscopy;  Laterality: N/A;  [facility] needed   EUS N/A 10/28/2021   Procedure: UPPER ENDOSCOPIC ULTRASOUND (EUS) LINEAR;  Surgeon: Reita Cliche, MD;  Location: [facility] ENDOSCOPY;  Service: Gastroenterology;  Laterality: N/A;  [facility]   transvaginal hysterectomy  04/18/05   with anterior colporrhaphy   Patient  Active Problem List   Diagnosis Date Noted   Encounter for immunotherapy 04/26/2022   Low TSH level 11/23/2021   PAD (peripheral artery disease) ([place]) 08/28/2021   Emphysema of lung ([place]) 08/28/2021   Esophageal cancer ([place]) 05/30/2021   Diarrhea 05/30/2021   Neoplasm of middle third of esophagus 05/10/2021   Stricture and stenosis of esophagus    Dysphagia 01/22/2021   Type 2 diabetes mellitus with hyperglycemia ([place]) 01/19/2021   COVID-19 virus infection 06/21/2020   Anemia 05/11/2020   Malignant neoplasm of right upper lobe of lung ([place]) 08/07/2019   URI (upper respiratory infection) 09/16/2016   Health care maintenance 05/18/2016   History of lung cancer 02/08/2016   Abdominal aortic aneurysm ([place]) 08/20/2015   Abnormal CXR 03/06/2014   Cough 03/06/2014   Headache(784.0) 03/06/2014   Hyperglycemia 02/07/2013   Hypertension 06/26/2012   Hypercholesterolemia 06/26/2012   Osteoporosis 06/26/2012   Hypercalcemia 06/26/2012   GERD (gastroesophageal reflux disease) 06/26/2012    ONSET DATE: 9 months, 10/2021  REFERRING DIAG: M62.81 (ICD-10-CM) - Muscle weakness (generalized)   THERAPY DIAG:  Muscle weakness (generalized)  Difficulty in walking, not elsewhere classified  Weakness of both hips  Rationale for Evaluation and Treatment: Rehabilitation  SUBJECTIVE:  SUBJECTIVE STATEMENT:  Pt reports that she is doing well, but states she had a rough night from food irritating stomach. No other new issues.   Pt accompanied by: self,  PERTINENT HISTORY:  Pt developed has history of cancer x2, but is in remission.  Pt has been on prednisone for 3 months, but should be off it in the next few days.  MD feels as though that has contributed to her weakness.  Pt has caregiver that comes over 3x week  that helps washing clothes due to having some things on the ground floor.  PAIN:  Are you having pain? No  PRECAUTIONS: Other: Cancer  WEIGHT BEARING RESTRICTIONS: No  FALLS: Has patient fallen in last 6 months? No  LIVING ENVIRONMENT: Lives with: lives alone Lives in: House/apartment Stairs: Yes: Internal: 13 steps; can reach both Has following equipment at home: Single point cane, Walker - 4 wheeled, shower chair, and Grab bars  PLOF: Independent and needing some assistance with driving.  PATIENT GOALS: To get stronger and stop utilizing the walker as much.  OBJECTIVE:   DIAGNOSTIC FINDINGS:   EXAM: NUCLEAR MEDICINE PET SKULL BASE TO THIGH  IMPRESSION: 1. No scintigraphic evidence of residual or recurrent esophageal hypermetabolism. 2. No evidence of hypermetabolic metastatic disease. 3. Similar aneurysmal dilation of the infrarenal abdominal aorta measuring 4.6 cm. Recommend follow-up CT/MR every 6 months and vascular consultation. This recommendation follows ACR consensus guidelines: White Paper of the ACR Incidental Findings Committee II on Vascular Findings. J Am Coll Radiol 2013JB:[id number]. 4. Aortic Atherosclerosis (ICD10-I70.0) and Emphysema (ICD10-J43.9).  COGNITION: Overall cognitive status: Within functional limits for tasks assessed   SENSATION: WFL  COORDINATION: WFL  POSTURE: No Significant postural limitations   LOWER EXTREMITY MMT:    MMT Right Eval Left Eval  Hip flexion 4 4  Hip abduction 4 4  Hip adduction 4- 4-  Knee flexion 4- 4-  Knee extension 4- 4-  Ankle dorsiflexion 4- 4-  (Blank rows = not tested)  FUNCTIONAL TESTS:  5 times sit to stand: 34.89 sec Timed up and go (TUG): 27.56 sec 6 minute walk test: 66' with use of rollator 10 meter walk test: 32.77 sec Berg Balance Scale: 37/56  PATIENT SURVEYS:  FOTO 45/54  TODAY'S TREATMENT: DATE: 08/25/22   Pt performed nustep endurance training x 4 min while Subjective  assessment performed level 4 throughout. Cues for full ROM on BLE and BUE as well as improved amplitude of movements. Pt rates Borg RPE 6/10 upon completion  Dynamic balance training:  Forward reverse gait with 7.5# resistance from cable machine. With rollator 58f x 3 and no AD 276fx 3. Therapeutic rest break between bouts due to BLE weakness.  In parallel bars: Side stepping R and L with UE support x 1 and no UE support 4 Forward reverse with UE support x  1 and no UE support x 4.  Stepping over 3 canes on floor with alternating step to gait pattern with UE support 6 and no UE support x6.  Side stepping over 3 canes with UE support x 3 bil and no UE support x 4 bil.  1 near LOB from stepping on cane, but able to prevent fall with UE support on rail and min assist from PT.   Gait training with no AD x 15050fith CGA progressing to supervision assist from PT for safety. Instruction from PT to increase step length on LLE>RLE. Additional gait training with SPC with cues for reciprocal pattern  and step length intermittently.    PATIENT EDUCATION: Education details:  Pt educated throughout session about proper posture and technique with exercises. Improved exercise technique, movement at target joints, use of target muscles after min to mod verbal, visual, tactile cues   Person educated: Patient and Child(ren) Education method: Explanation Education comprehension: verbalized understanding  HOME EXERCISE PROGRAM:  Access Code: [email or web address] URL: [email or web address] Date: 08/16/2022 Prepared by: Barrie Folk  Exercises - Seated Calf Stretch with Strap  - 1 x daily - 7 x weekly - 3 sets - 10 reps - Seated Calf Stretch knee bent with Strap  - 1 x daily - 7 x weekly - 3 sets - 10 reps - Standing Hip Flexor Stretch  - 1 x daily - 7 x weekly - 3 sets - 10 reps  Access Code: [email or web address] URL: [email or web address] Date: 07/14/2022 Prepared by: [place] Nation  Exercises - Supine Bridge  - 1 x daily - 7 x weekly - 3 sets - 10 reps - Clamshell  - 1 x daily - 7 x weekly - 3 sets - 10 reps - Sidelying Hip Abduction  - 1 x daily - 7 x weekly - 3 sets - 10 reps - Supine Posterior Pelvic Tilt  - 1 x daily - 7 x weekly - 3 sets - 10 reps - Sidelying Reverse Clamshell  - 1 x daily - 7 x weekly - 3 sets - 10 reps - Active Straight Leg Raise with Quad Set  - 1 x daily - 7 x weekly - 3 sets - 10 reps  GOALS: Goals reviewed with patient? Yes  SHORT TERM GOALS: Target date: 08/09/2022  Pt will be independent with HEP in order to demonstrate increased ability to perform tasks related to occupation/hobbies. Baseline: Pt to be given HEP at next visit Goal status: IN PROGRESS  LONG TERM GOALS: Target date: 10/04/2022  1.  Patient (> 28 years old) will complete five times sit to stand test in < 15 seconds indicating an increased LE strength and improved balance. Baseline: 34.89 sec Goal status: IN PROGRESS  2.  Patient will increase FOTO score to equal to or greater than 54 to demonstrate statistically significant improvement in mobility and quality of life.  Baseline: FOTO: 45 Goal status: IN PROGRESS   3.  Patient will increase Berg Balance score by > 6 points to demonstrate decreased fall risk during functional activities. Baseline: BERG: 37  08/12/2022: 39  Goal status: IN PROGRESS   4.  Patient will reduce timed up and go to <11 seconds to reduce fall risk and demonstrate improved transfer/gait ability. Baseline: 27.56 sec 08/12/2022: 12.9 with no AD  Goal status: IN PROGRESS  5.  Patient will increase 10 meter walk test to >1.18ms as to improve gait speed for better community ambulation and to reduce fall risk. Baseline: 32.77 sec Goal status: IN PROGRESS  6.  Patient will increase six minute walk test distance to >1000 for progression to community ambulator and improve gait ability Baseline: 796 with use of rollator 08/12/2022:  820' Goal status: IN PROGRESS    ASSESSMENT:  CLINICAL IMPRESSION:  PT treatment focused on gait training and functional balance training. Pt put forth good effort throughout session towards gait with reduced reliance on UE support. Pt noted to have improved confidence and symmetry of gait without UE support and with SPC. Daughter excited to see patient progressing with reduced need for AD with mobility. Will benefit from continued strength endurance and balance training as  well as continued skilled therapy to address remaining deficits in order to improve overall QoL and return to PLOF.       OBJECTIVE IMPAIRMENTS: Abnormal gait, decreased activity tolerance, decreased balance, decreased endurance, decreased knowledge of use of DME, decreased mobility, difficulty walking, and decreased strength.   ACTIVITY LIMITATIONS: carrying, lifting, standing, squatting, stairs, transfers, bed mobility, continence, bathing, toileting, dressing, and locomotion level  PARTICIPATION LIMITATIONS: cleaning, laundry, driving, shopping, community activity, yard work, and church  PERSONAL FACTORS: Age, Fitness, Past/current experiences, and Time since onset of injury/illness/exacerbation are also affecting patient's functional outcome.   REHAB POTENTIAL: Good  CLINICAL DECISION MAKING: Stable/uncomplicated  EVALUATION COMPLEXITY: Low  PLAN:  PT FREQUENCY: 2x/week  PT DURATION: 12 weeks  PLANNED INTERVENTIONS: Therapeutic exercises, Therapeutic activity, Neuromuscular re-education, Balance training, Gait training, Patient/Family education, Self Care, Joint mobilization, Stair training, Vestibular training, Canalith repositioning, DME instructions, Aquatic Therapy, Dry Needling, Cryotherapy, Moist heat, Taping, Manual therapy, and Re-evaluation  PLAN FOR NEXT SESSION:   Continue to progress BLE strengthening program; dynamic balance training with Use of Blaze pods potentially     Barrie Folk PT,  DPT  Physical Therapist - [facility]  12:29 PM 08/25/22

## 2022-08-29 ENCOUNTER — Ambulatory Visit: Payer: Medicare PPO

## 2022-08-30 ENCOUNTER — Other Ambulatory Visit: Payer: Self-pay

## 2022-08-30 ENCOUNTER — Ambulatory Visit: Payer: Medicare PPO | Admitting: Physical Therapy

## 2022-08-30 DIAGNOSIS — M6281 Muscle weakness (generalized): Secondary | ICD-10-CM | POA: Diagnosis not present

## 2022-08-30 DIAGNOSIS — R262 Difficulty in walking, not elsewhere classified: Secondary | ICD-10-CM

## 2022-08-30 DIAGNOSIS — R29898 Other symptoms and signs involving the musculoskeletal system: Secondary | ICD-10-CM | POA: Diagnosis not present

## 2022-08-30 NOTE — Therapy (Signed)
OUTPATIENT PHYSICAL THERAPY NEURO TREATMENT    Patient Name: Heather Cooper MRN: [id number] DOB:[birth year], 89 y.o., female Today's Date: 08/30/2022   PCP: [name], MD REFERRING PROVIDER: Charlaine Dalton, MD  END OF SESSION:  PT End of Session - 08/30/22 1109     Visit Number 15    Number of Visits 24    Date for PT Re-Evaluation 10/04/22    Progress Note Due on Visit 20    PT Start Time 1105    PT Stop Time 1149    PT Time Calculation (min) 44 min    Equipment Utilized During Treatment Gait belt    Activity Tolerance Patient tolerated treatment well    Behavior During Therapy WFL for tasks assessed/performed                Past Medical History:  Diagnosis Date   Chemotherapy induced nausea and vomiting    Chicken pox    Esophageal cancer ([place])    Hypercalcemia    familial hypocalciuric hypercalcemia   Hypercholesterolemia    Hypertension    Lung cancer ([place])    Osteoporosis    Thyroid disease    Past Surgical History:  Procedure Laterality Date   ABDOMINAL HYSTERECTOMY     partial   CHOLECYSTECTOMY     COLONOSCOPY WITH PROPOFOL N/A 04/17/2017   Procedure: COLONOSCOPY WITH PROPOFOL;  Surgeon: Manya Silvas, MD;  Location: [facility] ENDOSCOPY;  Service: Endoscopy;  Laterality: N/A;   ESOPHAGOGASTRODUODENOSCOPY (EGD) WITH PROPOFOL N/A 04/13/2021   Procedure: ESOPHAGOGASTRODUODENOSCOPY (EGD) WITH PROPOFOL;  Surgeon: [name], MD;  Location: [facility] ENDOSCOPY;  Service: Endoscopy;  Laterality: N/A;   EUS N/A 04/22/2021   Procedure: FULL UPPER ENDOSCOPIC ULTRASOUND (EUS) RADIAL;  Surgeon: Jola Schmidt, MD;  Location: [facility] ENDOSCOPY;  Service: Endoscopy;  Laterality: N/A;  [facility] needed   EUS N/A 10/28/2021   Procedure: UPPER ENDOSCOPIC ULTRASOUND (EUS) LINEAR;  Surgeon: Reita Cliche, MD;  Location: [facility] ENDOSCOPY;  Service: Gastroenterology;  Laterality: N/A;  [facility]   transvaginal hysterectomy  04/18/05   with anterior colporrhaphy   Patient  Active Problem List   Diagnosis Date Noted   Encounter for immunotherapy 04/26/2022   Low TSH level 11/23/2021   PAD (peripheral artery disease) ([place]) 08/28/2021   Emphysema of lung ([place]) 08/28/2021   Esophageal cancer ([place]) 05/30/2021   Diarrhea 05/30/2021   Neoplasm of middle third of esophagus 05/10/2021   Stricture and stenosis of esophagus    Dysphagia 01/22/2021   Type 2 diabetes mellitus with hyperglycemia ([place]) 01/19/2021   COVID-19 virus infection 06/21/2020   Anemia 05/11/2020   Malignant neoplasm of right upper lobe of lung ([place]) 08/07/2019   URI (upper respiratory infection) 09/16/2016   Health care maintenance 05/18/2016   History of lung cancer 02/08/2016   Abdominal aortic aneurysm ([place]) 08/20/2015   Abnormal CXR 03/06/2014   Cough 03/06/2014   Headache(784.0) 03/06/2014   Hyperglycemia 02/07/2013   Hypertension 06/26/2012   Hypercholesterolemia 06/26/2012   Osteoporosis 06/26/2012   Hypercalcemia 06/26/2012   GERD (gastroesophageal reflux disease) 06/26/2012    ONSET DATE: 9 months, 10/2021  REFERRING DIAG: M62.81 (ICD-10-CM) - Muscle weakness (generalized)   THERAPY DIAG:  Muscle weakness (generalized)  Difficulty in walking, not elsewhere classified  Weakness of both hips  Rationale for Evaluation and Treatment: Rehabilitation  SUBJECTIVE:  SUBJECTIVE STATEMENT:  Pt reports that she is doing well, but states she had a rough night from food irritating stomach. No other new issues.   Pt accompanied by: self,  PERTINENT HISTORY:  Pt developed has history of cancer x2, but is in remission.  Pt has been on prednisone for 3 months, but should be off it in the next few days.  MD feels as though that has contributed to her weakness.  Pt has caregiver that comes over 3x week  that helps washing clothes due to having some things on the ground floor.  PAIN:  Are you having pain? No  PRECAUTIONS: Other: Cancer  WEIGHT BEARING RESTRICTIONS: No  FALLS: Has patient fallen in last 6 months? No  LIVING ENVIRONMENT: Lives with: lives alone Lives in: House/apartment Stairs: Yes: Internal: 13 steps; can reach both Has following equipment at home: Single point cane, Walker - 4 wheeled, shower chair, and Grab bars  PLOF: Independent and needing some assistance with driving.  PATIENT GOALS: To get stronger and stop utilizing the walker as much.  OBJECTIVE:   DIAGNOSTIC FINDINGS:   EXAM: NUCLEAR MEDICINE PET SKULL BASE TO THIGH  IMPRESSION: 1. No scintigraphic evidence of residual or recurrent esophageal hypermetabolism. 2. No evidence of hypermetabolic metastatic disease. 3. Similar aneurysmal dilation of the infrarenal abdominal aorta measuring 4.6 cm. Recommend follow-up CT/MR every 6 months and vascular consultation. This recommendation follows ACR consensus guidelines: White Paper of the ACR Incidental Findings Committee II on Vascular Findings. J Am Coll Radiol 2013JB:[id number]. 4. Aortic Atherosclerosis (ICD10-I70.0) and Emphysema (ICD10-J43.9).  COGNITION: Overall cognitive status: Within functional limits for tasks assessed   SENSATION: WFL  COORDINATION: WFL  POSTURE: No Significant postural limitations   LOWER EXTREMITY MMT:    MMT Right Eval Left Eval  Hip flexion 4 4  Hip abduction 4 4  Hip adduction 4- 4-  Knee flexion 4- 4-  Knee extension 4- 4-  Ankle dorsiflexion 4- 4-  (Blank rows = not tested)  FUNCTIONAL TESTS:  5 times sit to stand: 34.89 sec Timed up and go (TUG): 27.56 sec 6 minute walk test: 36' with use of rollator 10 meter walk test: 32.77 sec Berg Balance Scale: 37/56  PATIENT SURVEYS:  FOTO 45/54  TODAY'S TREATMENT: DATE: 08/30/22   Standing soleus/heel cord stretch on 5inch wedge 2 x 45 sec bil.   Side stepping at rail with RTB below knees 30ft x 6 bil with UE supported on rail reciprocal march x 10 BLE with RTB on bil feet. Lateral box step 2x 8 bil Posterior box step 2x 8 bil Seated hip flexion 2 x 10 Bil RTB.  Seated hip abduction 2 x 12 RTB Gait training with no AD x 336ft and CGA-supervision assist with cues for improved step length on the LLE and decreased force of heel contact on the L side.  Gait training with 30# on rollator to engage improved hip extension to ambulate against resistance x 31ft. Pt noted to have improved step length initially, but returned to baseline with fatigue for final 50ft.     PATIENT EDUCATION: Education details:  Pt educated throughout session about proper posture and technique with exercises. Improved exercise technique, movement at target joints, use of target muscles after min to mod verbal, visual, tactile cues   Person educated: Patient and Child(ren) Education method: Explanation Education comprehension: verbalized understanding  HOME EXERCISE PROGRAM:  Access Code: [email or web address] URL: [email or web address] Date: 08/16/2022 Prepared by: Barrie Folk  Exercises - Seated Calf  Stretch with Strap  - 1 x daily - 7 x weekly - 3 sets - 10 reps - Seated Calf Stretch knee bent with Strap  - 1 x daily - 7 x weekly - 3 sets - 10 reps - Standing Hip Flexor Stretch  - 1 x daily - 7 x weekly - 3 sets - 10 reps  Access Code: [email or web address] URL: [email or web address] Date: 07/14/2022 Prepared by: [place] Nation  Exercises - Supine Bridge  - 1 x daily - 7 x weekly - 3 sets - 10 reps - Clamshell  - 1 x daily - 7 x weekly - 3 sets - 10 reps - Sidelying Hip Abduction  - 1 x daily - 7 x weekly - 3 sets - 10 reps - Supine Posterior Pelvic Tilt  - 1 x daily - 7 x weekly - 3 sets - 10 reps - Sidelying Reverse Clamshell  - 1 x daily - 7 x weekly - 3 sets - 10 reps - Active Straight Leg Raise with Quad Set  - 1 x daily - 7 x weekly - 3  sets - 10 reps  GOALS: Goals reviewed with patient? Yes  SHORT TERM GOALS: Target date: 08/09/2022  Pt will be independent with HEP in order to demonstrate increased ability to perform tasks related to occupation/hobbies. Baseline: Pt to be given HEP at next visit Goal status: IN PROGRESS  LONG TERM GOALS: Target date: 10/04/2022  1.  Patient (> 12 years old) will complete five times sit to stand test in < 15 seconds indicating an increased LE strength and improved balance. Baseline: 34.89 sec Goal status: IN PROGRESS  2.  Patient will increase FOTO score to equal to or greater than 54 to demonstrate statistically significant improvement in mobility and quality of life.  Baseline: FOTO: 45 Goal status: IN PROGRESS   3.  Patient will increase Berg Balance score by > 6 points to demonstrate decreased fall risk during functional activities. Baseline: BERG: 37  08/12/2022: 39  Goal status: IN PROGRESS   4.  Patient will reduce timed up and go to <11 seconds to reduce fall risk and demonstrate improved transfer/gait ability. Baseline: 27.56 sec 08/12/2022: 12.9 with no AD  Goal status: IN PROGRESS  5.  Patient will increase 10 meter walk test to >1.16m/s as to improve gait speed for better community ambulation and to reduce fall risk. Baseline: 32.77 sec Goal status: IN PROGRESS  6.  Patient will increase six minute walk test distance to >1000 for progression to community ambulator and improve gait ability Baseline: 68' with use of rollator 08/12/2022: 820' Goal status: IN PROGRESS    ASSESSMENT:  CLINICAL IMPRESSION:  PT treatment focused on BLE strengthening and gait training. Pt put forth good effort throughout session towards gait with reduced reliance on UE support and activation of BLE with strengthing to target hip weakness. Demonstrated improved step length and decreased cadence with instruction from PT but limited due to fatigue. Will benefit from continued strength  endurance and balance training as well as continued skilled therapy to address remaining deficits in order to improve overall QoL and return to PLOF.       OBJECTIVE IMPAIRMENTS: Abnormal gait, decreased activity tolerance, decreased balance, decreased endurance, decreased knowledge of use of DME, decreased mobility, difficulty walking, and decreased strength.   ACTIVITY LIMITATIONS: carrying, lifting, standing, squatting, stairs, transfers, bed mobility, continence, bathing, toileting, dressing, and locomotion level  PARTICIPATION LIMITATIONS: cleaning, laundry, driving, shopping, community activity, yard work, and church  PERSONAL FACTORS: Age, Fitness, Past/current experiences, and Time since onset of injury/illness/exacerbation are also affecting patient's functional outcome.   REHAB POTENTIAL: Good  CLINICAL DECISION MAKING: Stable/uncomplicated  EVALUATION COMPLEXITY: Low  PLAN:  PT FREQUENCY: 2x/week  PT DURATION: 12 weeks  PLANNED INTERVENTIONS: Therapeutic exercises, Therapeutic activity, Neuromuscular re-education, Balance training, Gait training, Patient/Family education, Self Care, Joint mobilization, Stair training, Vestibular training, Canalith repositioning, DME instructions, Aquatic Therapy, Dry Needling, Cryotherapy, Moist heat, Taping, Manual therapy, and Re-evaluation  PLAN FOR NEXT SESSION:   Continue to progress BLE strengthening program; dynamic balance training with Use of Blaze pods and airex pad     Barrie Folk PT, DPT  Physical Therapist - [facility]  12:32 PM 08/30/22

## 2022-09-02 ENCOUNTER — Ambulatory Visit: Payer: Medicare PPO | Admitting: Physical Therapy

## 2022-09-05 ENCOUNTER — Ambulatory Visit: Payer: Medicare PPO

## 2022-09-06 ENCOUNTER — Ambulatory Visit
Admission: RE | Admit: 2022-09-06 | Discharge: 2022-09-06 | Disposition: A | Discharge status: Home / Self Care | Payer: Medicare PPO | Source: Ambulatory Visit | Attending: Internal Medicine | Admitting: Internal Medicine

## 2022-09-06 VITALS — Wt 124.0 lb

## 2022-09-06 DIAGNOSIS — C160 Malignant neoplasm of cardia: Secondary | ICD-10-CM | POA: Diagnosis not present

## 2022-09-06 DIAGNOSIS — I2510 Atherosclerotic heart disease of native coronary artery without angina pectoris: Secondary | ICD-10-CM | POA: Diagnosis not present

## 2022-09-06 DIAGNOSIS — I714 Abdominal aortic aneurysm, without rupture, unspecified: Secondary | ICD-10-CM | POA: Insufficient documentation

## 2022-09-06 DIAGNOSIS — J90 Pleural effusion, not elsewhere classified: Secondary | ICD-10-CM | POA: Insufficient documentation

## 2022-09-06 DIAGNOSIS — I7143 Infrarenal abdominal aortic aneurysm, without rupture: Secondary | ICD-10-CM | POA: Diagnosis not present

## 2022-09-06 DIAGNOSIS — M4856XA Collapsed vertebra, not elsewhere classified, lumbar region, initial encounter for fracture: Secondary | ICD-10-CM | POA: Diagnosis not present

## 2022-09-06 DIAGNOSIS — J439 Emphysema, unspecified: Secondary | ICD-10-CM | POA: Insufficient documentation

## 2022-09-06 DIAGNOSIS — I700 Atherosclerosis of aorta: Secondary | ICD-10-CM | POA: Diagnosis not present

## 2022-09-06 DIAGNOSIS — C159 Malignant neoplasm of esophagus, unspecified: Secondary | ICD-10-CM | POA: Insufficient documentation

## 2022-09-06 DIAGNOSIS — D490 Neoplasm of unspecified behavior of digestive system: Secondary | ICD-10-CM

## 2022-09-06 LAB — GLUCOSE, CAPILLARY: Glucose-Capillary: 110 mg/dL — ABNORMAL HIGH (ref 70–99)

## 2022-09-06 MED ORDER — FLUDEOXYGLUCOSE F - 18 (FDG) INJECTION
6.4000 | Freq: Once | INTRAVENOUS | Status: AC | PRN
  Administered 2022-09-06: 6.94 via INTRAVENOUS

## 2022-09-09 ENCOUNTER — Ambulatory Visit: Payer: Medicare PPO | Admitting: Physical Therapy

## 2022-09-09 DIAGNOSIS — M6281 Muscle weakness (generalized): Secondary | ICD-10-CM | POA: Diagnosis not present

## 2022-09-09 DIAGNOSIS — R29898 Other symptoms and signs involving the musculoskeletal system: Secondary | ICD-10-CM

## 2022-09-09 DIAGNOSIS — R262 Difficulty in walking, not elsewhere classified: Secondary | ICD-10-CM | POA: Diagnosis not present

## 2022-09-09 NOTE — Therapy (Signed)
OUTPATIENT PHYSICAL THERAPY NEURO TREATMENT    Patient Name: Heather Cooper MRN: [id number] DOB:Apr 09, 1931, 89 y.o., female Today's Date: 09/09/2022   PCP: [name], MD REFERRING PROVIDER: Charlaine Dalton, MD  END OF SESSION:  PT End of Session - 09/09/22 1012     Visit Number 16    Number of Visits 24    Date for PT Re-Evaluation 10/04/22    Progress Note Due on Visit 20    PT Start Time 1013    PT Stop Time 1056    PT Time Calculation (min) 43 min    Equipment Utilized During Treatment Gait belt    Activity Tolerance Patient tolerated treatment well    Behavior During Therapy WFL for tasks assessed/performed                Past Medical History:  Diagnosis Date   Chemotherapy induced nausea and vomiting    Chicken pox    Esophageal cancer ([place])    Hypercalcemia    familial hypocalciuric hypercalcemia   Hypercholesterolemia    Hypertension    Lung cancer ([place])    Osteoporosis    Thyroid disease    Past Surgical History:  Procedure Laterality Date   ABDOMINAL HYSTERECTOMY     partial   CHOLECYSTECTOMY     COLONOSCOPY WITH PROPOFOL N/A 04/17/2017   Procedure: COLONOSCOPY WITH PROPOFOL;  Surgeon: Manya Silvas, MD;  Location: [facility] ENDOSCOPY;  Service: Endoscopy;  Laterality: N/A;   ESOPHAGOGASTRODUODENOSCOPY (EGD) WITH PROPOFOL N/A 04/13/2021   Procedure: ESOPHAGOGASTRODUODENOSCOPY (EGD) WITH PROPOFOL;  Surgeon: [name], MD;  Location: [facility] ENDOSCOPY;  Service: Endoscopy;  Laterality: N/A;   EUS N/A 04/22/2021   Procedure: FULL UPPER ENDOSCOPIC ULTRASOUND (EUS) RADIAL;  Surgeon: Jola Schmidt, MD;  Location: [facility] ENDOSCOPY;  Service: Endoscopy;  Laterality: N/A;  [facility] needed   EUS N/A 10/28/2021   Procedure: UPPER ENDOSCOPIC ULTRASOUND (EUS) LINEAR;  Surgeon: Reita Cliche, MD;  Location: [facility] ENDOSCOPY;  Service: Gastroenterology;  Laterality: N/A;  [facility]   transvaginal hysterectomy  04/18/05   with anterior colporrhaphy   Patient  Active Problem List   Diagnosis Date Noted   Encounter for immunotherapy 04/26/2022   Low TSH level 11/23/2021   PAD (peripheral artery disease) ([place]) 08/28/2021   Emphysema of lung ([place]) 08/28/2021   Esophageal cancer ([place]) 05/30/2021   Diarrhea 05/30/2021   Neoplasm of middle third of esophagus 05/10/2021   Stricture and stenosis of esophagus    Dysphagia 01/22/2021   Type 2 diabetes mellitus with hyperglycemia ([place]) 01/19/2021   COVID-19 virus infection 06/21/2020   Anemia 05/11/2020   Malignant neoplasm of right upper lobe of lung ([place]) 08/07/2019   URI (upper respiratory infection) 09/16/2016   Health care maintenance 05/18/2016   History of lung cancer 02/08/2016   Abdominal aortic aneurysm ([place]) 08/20/2015   Abnormal CXR 03/06/2014   Cough 03/06/2014   Headache(784.0) 03/06/2014   Hyperglycemia 02/07/2013   Hypertension 06/26/2012   Hypercholesterolemia 06/26/2012   Osteoporosis 06/26/2012   Hypercalcemia 06/26/2012   GERD (gastroesophageal reflux disease) 06/26/2012    ONSET DATE: 9 months, 10/2021  REFERRING DIAG: M62.81 (ICD-10-CM) - Muscle weakness (generalized)   THERAPY DIAG:  Muscle weakness (generalized)  Difficulty in walking, not elsewhere classified  Weakness of both hips  Rationale for Evaluation and Treatment: Rehabilitation  SUBJECTIVE:  SUBJECTIVE STATEMENT:  Pt reports that she is doing well. Looking forward to the weather warming up over the next week or 2. No other new updates    Pt accompanied by: self,  PERTINENT HISTORY:  Pt developed has history of cancer x2, but is in remission.  Pt has been on prednisone for 3 months, but should be off it in the next few days.  MD feels as though that has contributed to her weakness.  Pt has caregiver that comes over  3x week that helps washing clothes due to having some things on the ground floor.  PAIN:  Are you having pain? No  PRECAUTIONS: Other: Cancer  WEIGHT BEARING RESTRICTIONS: No  FALLS: Has patient fallen in last 6 months? No  LIVING ENVIRONMENT: Lives with: lives alone Lives in: House/apartment Stairs: Yes: Internal: 13 steps; can reach both Has following equipment at home: Single point cane, Walker - 4 wheeled, shower chair, and Grab bars  PLOF: Independent and needing some assistance with driving.  PATIENT GOALS: To get stronger and stop utilizing the walker as much.  OBJECTIVE:   DIAGNOSTIC FINDINGS:   EXAM: NUCLEAR MEDICINE PET SKULL BASE TO THIGH  IMPRESSION: 1. No scintigraphic evidence of residual or recurrent esophageal hypermetabolism. 2. No evidence of hypermetabolic metastatic disease. 3. Similar aneurysmal dilation of the infrarenal abdominal aorta measuring 4.6 cm. Recommend follow-up CT/MR every 6 months and vascular consultation. This recommendation follows ACR consensus guidelines: White Paper of the ACR Incidental Findings Committee II on Vascular Findings. J Am Coll Radiol 2013JB:[id number]. 4. Aortic Atherosclerosis (ICD10-I70.0) and Emphysema (ICD10-J43.9).  COGNITION: Overall cognitive status: Within functional limits for tasks assessed   SENSATION: WFL  COORDINATION: WFL  POSTURE: No Significant postural limitations   LOWER EXTREMITY MMT:    MMT Right Eval Left Eval  Hip flexion 4 4  Hip abduction 4 4  Hip adduction 4- 4-  Knee flexion 4- 4-  Knee extension 4- 4-  Ankle dorsiflexion 4- 4-  (Blank rows = not tested)  FUNCTIONAL TESTS:  5 times sit to stand: 34.89 sec Timed up and go (TUG): 27.56 sec 6 minute walk test: 67' with use of rollator 10 meter walk test: 32.77 sec Berg Balance Scale: 37/56  PATIENT SURVEYS:  FOTO 45/54  TODAY'S TREATMENT: DATE: 09/09/22   Nustep BLE/BUE reciprocal endurance training x 5 min  level 3 with cues for SPM 45-55, as well as full ROM. Forward lunge in parallel bars with 45 sec hold to step over bolster x 4   PT treatment focused on dynamic gait training over variable surfaces including through hall of hospital, cement sidewalk and grass. Intermittent therapeutic seated rest breaks throughout due to BLE fatigue and instruction from PT to improve step length reciprocal arm swing. Performed 6 bouts >500-1078ft each.  Pt rates RPE 6-8 upon completion of each bout of gait training. Noted to have improved step height and length on unlevel grass surface compared to cement sidewalk.     PATIENT EDUCATION: Education details:  Pt educated throughout session about proper posture and technique with exercises. Improved exercise technique, movement at target joints, use of target muscles after min to mod verbal, visual, tactile cues   Person educated: Patient and Child(ren) Education method: Explanation Education comprehension: verbalized understanding  HOME EXERCISE PROGRAM:  Access Code: [email or web address] URL: [email or web address] Date: 08/16/2022 Prepared by: Barrie Folk  Exercises - Seated Calf Stretch with Strap  - 1 x daily - 7 x weekly - 3 sets - 10 reps -  Seated Calf Stretch knee bent with Strap  - 1 x daily - 7 x weekly - 3 sets - 10 reps - Standing Hip Flexor Stretch  - 1 x daily - 7 x weekly - 3 sets - 10 reps  Access Code: [email or web address] URL: [email or web address] Date: 07/14/2022 Prepared by: Funk Nation  Exercises - Supine Bridge  - 1 x daily - 7 x weekly - 3 sets - 10 reps - Clamshell  - 1 x daily - 7 x weekly - 3 sets - 10 reps - Sidelying Hip Abduction  - 1 x daily - 7 x weekly - 3 sets - 10 reps - Supine Posterior Pelvic Tilt  - 1 x daily - 7 x weekly - 3 sets - 10 reps - Sidelying Reverse Clamshell  - 1 x daily - 7 x weekly - 3 sets - 10 reps - Active Straight Leg Raise with Quad Set  - 1 x daily - 7 x weekly - 3 sets - 10  reps  GOALS: Goals reviewed with patient? Yes  SHORT TERM GOALS: Target date: 08/09/2022  Pt will be independent with HEP in order to demonstrate increased ability to perform tasks related to occupation/hobbies. Baseline: Pt to be given HEP at next visit Goal status: IN PROGRESS  LONG TERM GOALS: Target date: 10/04/2022  1.  Patient (> 44 years old) will complete five times sit to stand test in < 15 seconds indicating an increased LE strength and improved balance. Baseline: 34.89 sec Goal status: IN PROGRESS  2.  Patient will increase FOTO score to equal to or greater than 54 to demonstrate statistically significant improvement in mobility and quality of life.  Baseline: FOTO: 45 Goal status: IN PROGRESS   3.  Patient will increase Berg Balance score by > 6 points to demonstrate decreased fall risk during functional activities. Baseline: BERG: 37  08/12/2022: 39  Goal status: IN PROGRESS   4.  Patient will reduce timed up and go to <11 seconds to reduce fall risk and demonstrate improved transfer/gait ability. Baseline: 27.56 sec 08/12/2022: 12.9 with no AD  Goal status: IN PROGRESS  5.  Patient will increase 10 meter walk test to >1.51m/s as to improve gait speed for better community ambulation and to reduce fall risk. Baseline: 32.77 sec Goal status: IN PROGRESS  6.  Patient will increase six minute walk test distance to >1000 for progression to community ambulator and improve gait ability Baseline: 15' with use of rollator 08/12/2022: 820' Goal status: IN PROGRESS    ASSESSMENT:  CLINICAL IMPRESSION:  PT treatment focused on high intensity variable gait training. Pt able to perform gait through various environments with CGA-supervision assist from PT for safety with instruction for reciprocal swing, improved step length and heel contact. Mild improvement with instruction from PT, but poor carry over between bouts.  Will benefit from continued strength endurance and balance  training as well as continued skilled therapy to address remaining deficits in order to improve overall QoL and return to PLOF.       OBJECTIVE IMPAIRMENTS: Abnormal gait, decreased activity tolerance, decreased balance, decreased endurance, decreased knowledge of use of DME, decreased mobility, difficulty walking, and decreased strength.   ACTIVITY LIMITATIONS: carrying, lifting, standing, squatting, stairs, transfers, bed mobility, continence, bathing, toileting, dressing, and locomotion level  PARTICIPATION LIMITATIONS: cleaning, laundry, driving, shopping, community activity, yard work, and church  PERSONAL FACTORS: Age, Fitness, Past/current experiences, and Time since onset of injury/illness/exacerbation are also affecting patient's functional outcome.  REHAB POTENTIAL: Good  CLINICAL DECISION MAKING: Stable/uncomplicated  EVALUATION COMPLEXITY: Low  PLAN:  PT FREQUENCY: 2x/week  PT DURATION: 12 weeks  PLANNED INTERVENTIONS: Therapeutic exercises, Therapeutic activity, Neuromuscular re-education, Balance training, Gait training, Patient/Family education, Self Care, Joint mobilization, Stair training, Vestibular training, Canalith repositioning, DME instructions, Aquatic Therapy, Dry Needling, Cryotherapy, Moist heat, Taping, Manual therapy, and Re-evaluation  PLAN FOR NEXT SESSION:   Continue to progress BLE strengthening program; dynamic balance training with Use of Blaze pods and airex pad     Barrie Folk PT, DPT  Physical Therapist - [facility]  11:02 AM 09/09/22

## 2022-09-12 ENCOUNTER — Ambulatory Visit: Payer: Medicare PPO

## 2022-09-13 ENCOUNTER — Inpatient Hospital Stay (HOSPITAL_BASED_OUTPATIENT_CLINIC_OR_DEPARTMENT_OTHER): Payer: Medicare PPO | Admitting: Internal Medicine

## 2022-09-13 ENCOUNTER — Encounter: Payer: Self-pay | Admitting: Internal Medicine

## 2022-09-13 ENCOUNTER — Inpatient Hospital Stay: Payer: Medicare PPO | Attending: Internal Medicine

## 2022-09-13 VITALS — BP 147/78 | HR 90 | Temp 97.1°F | Resp 16 | Wt 126.5 lb

## 2022-09-13 DIAGNOSIS — C778 Secondary and unspecified malignant neoplasm of lymph nodes of multiple regions: Secondary | ICD-10-CM | POA: Diagnosis not present

## 2022-09-13 DIAGNOSIS — C154 Malignant neoplasm of middle third of esophagus: Secondary | ICD-10-CM | POA: Diagnosis not present

## 2022-09-13 DIAGNOSIS — D490 Neoplasm of unspecified behavior of digestive system: Secondary | ICD-10-CM

## 2022-09-13 LAB — CBC WITH DIFFERENTIAL/PLATELET
Abs Immature Granulocytes: 0.02 10*3/uL (ref 0.00–0.07)
Basophils Absolute: 0.1 10*3/uL (ref 0.0–0.1)
Basophils Relative: 1 %
Eosinophils Absolute: 0.3 10*3/uL (ref 0.0–0.5)
Eosinophils Relative: 4 %
HCT: 36.4 % (ref 36.0–46.0)
Hemoglobin: 12.1 g/dL (ref 12.0–15.0)
Immature Granulocytes: 0 %
Lymphocytes Relative: 32 %
Lymphs Abs: 2.5 10*3/uL (ref 0.7–4.0)
MCH: 29 pg (ref 26.0–34.0)
MCHC: 33.2 g/dL (ref 30.0–36.0)
MCV: 87.3 fL (ref 80.0–100.0)
Monocytes Absolute: 0.6 10*3/uL (ref 0.1–1.0)
Monocytes Relative: 8 %
Neutro Abs: 4.3 10*3/uL (ref 1.7–7.7)
Neutrophils Relative %: 55 %
Platelets: 239 10*3/uL (ref 150–400)
RBC: 4.17 MIL/uL (ref 3.87–5.11)
RDW: 13.2 % (ref 11.5–15.5)
WBC: 7.8 10*3/uL (ref 4.0–10.5)
nRBC: 0 % (ref 0.0–0.2)

## 2022-09-13 LAB — COMPREHENSIVE METABOLIC PANEL
ALT: 10 U/L (ref 0–44)
AST: 18 U/L (ref 15–41)
Albumin: 4.1 g/dL (ref 3.5–5.0)
Alkaline Phosphatase: 74 U/L (ref 38–126)
Anion gap: 6 (ref 5–15)
BUN: 21 mg/dL (ref 8–23)
CO2: 29 mmol/L (ref 22–32)
Calcium: 10.1 mg/dL (ref 8.9–10.3)
Chloride: 102 mmol/L (ref 98–111)
Creatinine, Ser: 0.78 mg/dL (ref 0.44–1.00)
GFR, Estimated: >60 mL/min (ref >60)
Glucose, Bld: 110 mg/dL — ABNORMAL HIGH (ref 70–99)
Potassium: 3.9 mmol/L (ref 3.5–5.1)
Sodium: 137 mmol/L (ref 135–145)
Total Bilirubin: 0.3 mg/dL (ref 0.3–1.2)
Total Protein: 7.6 g/dL (ref 6.5–8.1)

## 2022-09-13 NOTE — Progress Notes (Signed)
[facility] OFFICE PROGRESS NOTE  Patient Care Team: [name], MD as PCP - General (Internal Medicine) Clent Jacks, RN as Oncology Nurse Navigator Cammie Sickle, MD as Consulting Physician (Oncology) [name], MD as Consulting Physician (Gastroenterology) Borders, Kirt Boys, NP as Nurse Practitioner (Hospice and Palliative Medicine) Gloris Ham, RN as Registered Nurse (Oncology)   Cancer Staging  Neoplasm of middle third of esophagus Staging form: Esophagus - Squamous Cell Carcinoma, AJCC 8th Edition - Clinical: Stage Unknown (cTX, cN1, cM0) - Signed by Cammie Sickle, MD on 05/10/2021 - Pathologic: No stage assigned - Unsigned   Oncology History Overview Note  # 2015-patient is an 89 year old female with 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  Starting radiation and chemotherapy from April 14, 2014 Patient was started on carboplatinum andTaxol Herve were developed an allergic reaction to Taxol so would be changed to Abraxane 3.patient has finished 6 cycles of weekly chemotherapy with carboplatinum  and radiation therapy(May 28, 2014) 4.started on  NIVOLULAMAB because of persistent disease July 02, 2014. 5.  NIVOLULAMAB was discontinued because of persistent diarrhea in July of 2016.  August of 2016 CT scan was stable so no further chemotherapy  # AUG 25th PET- STABLE RUL MASS [radiation fibrosis; <1cm ? 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   APRIL 16th, 2023- PET scan-  Resolution of metabolic activity in the mid esophagus;  Persistent hypermetabolic  activity within a subcarinal lymph node. Differential includes residual carcinoma versus reactive adenopathy.  No evidence of distant metastatic disease.  # MAY 18th, 2023- s/p EUS [Dr.Spaete]-endoscopic biopsy of the mediastinal lymph node. MAY 18th, 2023- Status post endoscopic ultrasound/biopsy of the mediastinal/subcarinal lymph node- BIOPSY POSITIVE FOR RECURRENT SQUAMOUS CELL.  # JUNE 9th, 2023- Opdivo every 2 weeks.   # AAA/ 3.3x 3.9 Stable [Dr.Schnier] -----------------------------------------------------       History of lung cancer  Malignant neoplasm of right upper lobe of lung  08/07/2019 Initial Diagnosis   Malignant neoplasm of right upper lobe of lung ([place])    INTERVAL HISTORY: Ambulating with a rolling walker.  Accompanied by Heather Cooper.   Heather Cooper 89 y.o.  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 Patient's Opdivo is currently on HOLD given the concern for autoimmune colitis from [facility].    Patient currently tapered off prednisone approximately month ago.  Patient was treated for immunotherapy induced colitis.  Denies any diarrhea. She is more  up and about.  Patient Renick getting physical therapy.  Review of Systems  Constitutional:  Positive for malaise/fatigue. Negative for chills, diaphoresis, fever and weight loss.  HENT:  Negative for nosebleeds and sore throat.   Eyes:  Negative for double vision.  Respiratory:  Negative for hemoptysis, sputum production, shortness of breath and wheezing.   Cardiovascular:  Negative for chest pain, palpitations, orthopnea and leg swelling.  Gastrointestinal:  Negative for abdominal pain, blood in stool, constipation, diarrhea, heartburn, melena, nausea and vomiting.  Genitourinary:  Negative for dysuria, frequency and urgency.  Musculoskeletal:  Negative for back  pain and joint pain.  Skin: Negative.  Negative for  itching and rash.  Neurological:  Negative for dizziness, tingling, focal weakness, weakness and headaches.  Endo/Heme/Allergies:  Does not bruise/bleed easily.  Psychiatric/Behavioral:  Negative for depression. The patient is not nervous/anxious and does not have insomnia.    PAST MEDICAL HISTORY :  Past Medical History:  Diagnosis Date   Chemotherapy induced nausea and vomiting    Chicken pox    Esophageal cancer    Hypercalcemia    familial hypocalciuric hypercalcemia   Hypercholesterolemia    Hypertension    Lung cancer    Osteoporosis    Thyroid disease     PAST SURGICAL HISTORY :   Past Surgical History:  Procedure Laterality Date   ABDOMINAL HYSTERECTOMY     partial   CHOLECYSTECTOMY     COLONOSCOPY WITH PROPOFOL N/A 04/17/2017   Procedure: COLONOSCOPY WITH PROPOFOL;  Surgeon: Manya Silvas, MD;  Location: [facility] ENDOSCOPY;  Service: Endoscopy;  Laterality: N/A;   ESOPHAGOGASTRODUODENOSCOPY (EGD) WITH PROPOFOL N/A 04/13/2021   Procedure: ESOPHAGOGASTRODUODENOSCOPY (EGD) WITH PROPOFOL;  Surgeon: [name], MD;  Location: [facility] ENDOSCOPY;  Service: Endoscopy;  Laterality: N/A;   EUS N/A 04/22/2021   Procedure: FULL UPPER ENDOSCOPIC ULTRASOUND (EUS) RADIAL;  Surgeon: Jola Schmidt, MD;  Location: [facility] ENDOSCOPY;  Service: Endoscopy;  Laterality: N/A;  [facility] needed   EUS N/A 10/28/2021   Procedure: UPPER ENDOSCOPIC ULTRASOUND (EUS) LINEAR;  Surgeon: Reita Cliche, MD;  Location: [facility] ENDOSCOPY;  Service: Gastroenterology;  Laterality: N/A;  [facility]   transvaginal hysterectomy  04/18/05   with anterior colporrhaphy    FAMILY HISTORY :   Family History  Problem Relation Age of Onset   Stroke Mother    Hypertension Mother    Prostate cancer Father    Cancer Father        prostate   Heart disease Brother        s/p CABG   Colon cancer Neg Hx     SOCIAL HISTORY:   Social History   Tobacco Use   Smoking status: Former    Types: Cigarettes    Quit date:  06/13/1997    Years since quitting: 25.2   Smokeless tobacco: Never  Vaping Use   Vaping Use: Never used  Substance Use Topics   Alcohol use: No    Alcohol/week: 0.0 standard drinks of alcohol   Drug use: No    ALLERGIES:  is allergic to paclitaxel.  MEDICATIONS:  Current Outpatient Medications  Medication Sig Dispense Refill   cholecalciferol (VITAMIN D3) 25 MCG (1000 UNIT) tablet Take 1,000 Units by mouth daily.     ferrous sulfate 325 (65 FE) MG EC tablet Take 325 mg by mouth daily with breakfast.     fluticasone (FLONASE) 50 MCG/ACT nasal spray Place 2 sprays into both nostrils daily. 16 g 3   levocetirizine (XYZAL) 5 MG tablet Take 1 tablet (5 mg total) by mouth every evening. 90 tablet 2   olmesartan-hydrochlorothiazide (BENICAR HCT) 20-12.5 MG tablet TAKE 1 TABLET BY MOUTH ONCE DAILY 90 tablet 1   pantoprazole (PROTONIX) 40 MG tablet TAKE 1 TABLET BY MOUTH ONCE DAILY 90 tablet 3   No current facility-administered medications for this visit.   Facility-Administered Medications Ordered in Other Visits  Medication Dose Route Frequency Provider Last Rate Last Admin   0.9 %  sodium chloride infusion   Intravenous Continuous Borders, Kirt Boys, NP   Stopped at 01/21/22 1247   sodium chloride  0.9 % 1,000 mL with potassium chloride 20 mEq, magnesium sulfate 2 g infusion   Intravenous Continuous Forest Gleason, MD   Stopped at 12/04/14 1300    PHYSICAL EXAMINATION: ECOG PERFORMANCE STATUS: 0 - Asymptomatic  BP (!) 147/78 (BP Location: Left Arm, Patient Position: Sitting)   Pulse 90   Temp (!) 97.1 F (36.2 C)   Resp 16   Wt 126 lb 8 oz (57.4 kg)   BMI 22.41 kg/m   Filed Weights   09/13/22 1008  Weight: 126 lb 8 oz (57.4 kg)        Physical Exam HENT:     Head: Normocephalic and atraumatic.     Mouth/Throat:     Pharynx: No oropharyngeal exudate.  Eyes:     Pupils: Pupils are equal, round, and reactive to light.  Cardiovascular:     Rate and Rhythm: Normal rate  and regular rhythm.  Pulmonary:     Effort: Pulmonary effort is normal. No respiratory distress.     Breath sounds: Normal breath sounds. No wheezing.  Abdominal:     General: Bowel sounds are normal. There is no distension.     Palpations: Abdomen is soft. There is no mass.     Tenderness: There is no abdominal tenderness. There is no guarding or rebound.  Musculoskeletal:        General: No tenderness. Normal range of motion.     Cervical back: Normal range of motion and neck supple.  Skin:    General: Skin is warm.  Neurological:     Mental Status: She is alert and oriented to person, place, and time.  Psychiatric:        Mood and Affect: Affect normal.      LABORATORY DATA:  I have reviewed the data as listed    Component Value Date/Time   NA 137 09/13/2022 0954   NA 130 (L) 10/09/2014 0846   K 3.9 09/13/2022 0954   K 4.1 10/09/2014 0846   CL 102 09/13/2022 0954   CL 98 (L) 10/09/2014 0846   CO2 29 09/13/2022 0954   CO2 25 10/09/2014 0846   GLUCOSE 110 (H) 09/13/2022 0954   GLUCOSE 161 (H) 10/09/2014 0846   BUN 21 09/13/2022 0954   BUN 14 10/09/2014 0846   CREATININE 0.78 09/13/2022 0954   CREATININE 0.70 10/09/2014 0846   CALCIUM 10.1 09/13/2022 0954   CALCIUM 9.1 10/09/2014 0846   PROT 7.6 09/13/2022 0954   PROT 7.3 10/09/2014 0846   ALBUMIN 4.1 09/13/2022 0954   ALBUMIN 3.6 10/09/2014 0846   AST 18 09/13/2022 0954   AST 16 10/09/2014 0846   ALT 10 09/13/2022 0954   ALT 13 (L) 10/09/2014 0846   ALKPHOS 74 09/13/2022 0954   ALKPHOS 70 10/09/2014 0846   BILITOT 0.3 09/13/2022 0954   BILITOT 1.0 10/09/2014 0846   GFRNONAA >60 09/13/2022 0954   GFRNONAA >60 10/09/2014 0846   GFRAA >60 02/05/2020 1004   GFRAA >60 10/09/2014 0846    No results found for: "SPEP", "UPEP"  Lab Results  Component Value Date   WBC 7.8 09/13/2022   NEUTROABS 4.3 09/13/2022   HGB 12.1 09/13/2022   HCT 36.4 09/13/2022   MCV 87.3 09/13/2022   PLT 239 09/13/2022       Chemistry      Component Value Date/Time   NA 137 09/13/2022 0954   NA 130 (L) 10/09/2014 0846   K 3.9 09/13/2022 0954   K 4.1 10/09/2014 0846  CL 102 09/13/2022 0954   CL 98 (L) 10/09/2014 0846   CO2 29 09/13/2022 0954   CO2 25 10/09/2014 0846   BUN 21 09/13/2022 0954   BUN 14 10/09/2014 0846   CREATININE 0.78 09/13/2022 0954   CREATININE 0.70 10/09/2014 0846   GLU 111 03/18/2014 1048      Component Value Date/Time   CALCIUM 10.1 09/13/2022 0954   CALCIUM 9.1 10/09/2014 0846   ALKPHOS 74 09/13/2022 0954   ALKPHOS 70 10/09/2014 0846   AST 18 09/13/2022 0954   AST 16 10/09/2014 0846   ALT 10 09/13/2022 0954   ALT 13 (L) 10/09/2014 0846   BILITOT 0.3 09/13/2022 0954   BILITOT 1.0 10/09/2014 0846       RADIOGRAPHIC STUDIES: I have personally reviewed the radiological images as listed and agreed with the findings in the report. No results found.    ASSESSMENT & PLAN:  Neoplasm of middle third of esophagus # Squamous cell carcinoma of the mid-esophagus [NOV 2022]-  locally advanced; RECURRENT [s/p EUS- LB Bx-] MAY 18th, 2023-most recently status post Opdivoq 2 W x4 cycles- [last July 23rd, 2023]; MARCH 27th, 2024- No evidence of hypermetabolic recurrent or metastatic esophageal primary. 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 New tiny right pleural effusion may be radiation induced.  # Continue to hold Opdivo at this time given patient's recent episode of severe diarrhea-likely related to  Opdivo/immunotherapy.  Will reevaluate in 3 months.  Will repeat imaging in 6 months.  Consider EGD.   # Autoimmune colitis/Opdivo induced diarrhea-s/p evaluation with GI; Dr.Wohl- improved- currently  OFF prednisone - stable.   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  # Mild  Anemia- improved- Hb 12.3 stable; continue gentle iron- space to every other day.   # PBF-BG- 125-  Monitor for now [Gluerna protein shakes]- continue  OFF prednisone as above. Stable.   #Hx of goitre-/ TSH low-however normal free T3-T4- .currently on surveillance s/p evaluation with  Dr. Forde Dandy, [facility] endocrinology. Ordered labs per endo. Stable.   # Incidental mild compression fracture of L2-new; discussed with the patient regarding osteoporosis workup/management.  Defer to PCP.  Messaged Dr. Nicki Reaper.  # Vaccinations: ok with Flu shot; and Covid-19.; ok with RSV  #Incidental findings on Imaging  CT , 2024: Atherosclerosis ; infrarenal abdominal aortic aneurysm including at 5.0 x 4.6 cm. Recommend continued follow-up  with Dr.scheiner  emphysema; reviewed with the patient counseled.  # IV access: PIV.   # DISPOSITION:  # follow up in 3 months-  MD; labs- cbc/cmp; iron studies; ferritin-NO treatment; Dr.B  # I reviewed the blood work- with the patient in detail; also reviewed the imaging independently [as summarized above]; and with the patient in detail.       Orders Placed This Encounter  Procedures   CBC with Differential ([facility] Only)    Standing Status:   Future    Standing Expiration Date:   09/12/2023   CMP ([place] only)    Standing Status:   Future    Standing Expiration Date:   09/13/2023   Iron and TIBC    Standing Status:   Future    Standing Expiration Date:   09/13/2023   Ferritin    Standing Status:   Future    Standing Expiration Date:   09/13/2023    All questions were answered. The patient knows to call the clinic with any problems, questions or concerns.      Lenetta Quaker R  Rogue Bussing, MD 09/13/2022 10:39 AM

## 2022-09-13 NOTE — Assessment & Plan Note (Addendum)
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 New tiny right pleural effusion may be radiation induced.  # Continue to hold Opdivo at this time given patient's recent episode of severe diarrhea-likely related to  Opdivo/immunotherapy.  Will reevaluate in 3 months.  Will repeat imaging in 6 months.  Consider EGD.   # Autoimmune colitis/Opdivo induced diarrhea-s/p evaluation with GI; Dr.Wohl- improved- currently  OFF prednisone - stable.   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  # Mild  Anemia- improved- Hb 12.3 stable; continue gentle iron- space to every other day.   # PBF-BG- 125-  Monitor for now [Gluerna protein shakes]- continue OFF prednisone as above. Stable.   #Hx of goitre-/ TSH low-however normal free T3-T4- .currently on surveillance s/p evaluation with  Dr. Forde Dandy, [facility] endocrinology. Ordered labs per endo. Stable.   # Incidental mild compression fracture of L2-new; discussed with the patient regarding osteoporosis workup/management.  Defer to PCP.  Messaged Dr. Nicki Reaper.  # Vaccinations: ok with Flu shot; and Covid-19.; ok with RSV  #Incidental findings on Imaging  CT , 2024: Atherosclerosis ; infrarenal abdominal aortic aneurysm including at 5.0 x 4.6 cm. Recommend continued follow-up  with Dr.scheiner  emphysema; reviewed with the patient counseled.  # IV access: PIV.   # DISPOSITION:  # follow up in 3 months-  MD; labs- cbc/cmp; iron studies; ferritin-NO treatment; Dr.B  # I reviewed the blood work- with the patient in detail; also reviewed the imaging independently [as summarized above]; and with the patient in detail.

## 2022-09-13 NOTE — Progress Notes (Signed)
Finished prednisone 1 1/2 months ago and no diarrhea . Appetite is good. Working with PT on ambulation twice a week. Denies any pain. No neuropathy.

## 2022-09-16 ENCOUNTER — Ambulatory Visit: Payer: Medicare PPO | Attending: Internal Medicine

## 2022-09-16 DIAGNOSIS — M6281 Muscle weakness (generalized): Secondary | ICD-10-CM | POA: Diagnosis not present

## 2022-09-16 DIAGNOSIS — R29898 Other symptoms and signs involving the musculoskeletal system: Secondary | ICD-10-CM | POA: Insufficient documentation

## 2022-09-16 DIAGNOSIS — R262 Difficulty in walking, not elsewhere classified: Secondary | ICD-10-CM | POA: Diagnosis not present

## 2022-09-16 NOTE — Therapy (Signed)
OUTPATIENT PHYSICAL THERAPY NEURO TREATMENT    Patient Name: Heather Cooper MRN: [id number] DOB:March 12, 1931, 89 y.o., female Today's Date: 09/16/2022   PCP: Dale [place], MD REFERRING PROVIDER: Louretta Shorten, MD  END OF SESSION:  PT End of Session - 09/16/22 1016     Visit Number 17    Number of Visits 24    Date for PT Re-Evaluation 10/04/22    Progress Note Due on Visit 20    PT Start Time 1009    PT Stop Time 1055    PT Time Calculation (min) 46 min    Equipment Utilized During Treatment Gait belt    Activity Tolerance Patient tolerated treatment well    Behavior During Therapy WFL for tasks assessed/performed                 Past Medical History:  Diagnosis Date   Chemotherapy induced nausea and vomiting    Chicken pox    Esophageal cancer    Hypercalcemia    familial hypocalciuric hypercalcemia   Hypercholesterolemia    Hypertension    Lung cancer    Osteoporosis    Thyroid disease    Past Surgical History:  Procedure Laterality Date   ABDOMINAL HYSTERECTOMY     partial   CHOLECYSTECTOMY     COLONOSCOPY WITH PROPOFOL N/A 04/17/2017   Procedure: COLONOSCOPY WITH PROPOFOL;  Surgeon: Scot Jun, MD;  Location: [facility] ENDOSCOPY;  Service: Endoscopy;  Laterality: N/A;   ESOPHAGOGASTRODUODENOSCOPY (EGD) WITH PROPOFOL N/A 04/13/2021   Procedure: ESOPHAGOGASTRODUODENOSCOPY (EGD) WITH PROPOFOL;  Surgeon: Midge Minium, MD;  Location: [facility] ENDOSCOPY;  Service: Endoscopy;  Laterality: N/A;   EUS N/A 04/22/2021   Procedure: FULL UPPER ENDOSCOPIC ULTRASOUND (EUS) RADIAL;  Surgeon: Rayann Heman, MD;  Location: [facility] ENDOSCOPY;  Service: Endoscopy;  Laterality: N/A;  [facility] needed   EUS N/A 10/28/2021   Procedure: UPPER ENDOSCOPIC ULTRASOUND (EUS) LINEAR;  Surgeon: Doren Custard, MD;  Location: [facility] ENDOSCOPY;  Service: Gastroenterology;  Laterality: N/A;  [facility]   transvaginal hysterectomy  04/18/05   with anterior colporrhaphy   Patient Active  Problem List   Diagnosis Date Noted   Encounter for immunotherapy 04/26/2022   Low TSH level 11/23/2021   PAD (peripheral artery disease) 08/28/2021   Emphysema of lung 08/28/2021   Esophageal cancer 05/30/2021   Diarrhea 05/30/2021   Neoplasm of middle third of esophagus 05/10/2021   Stricture and stenosis of esophagus    Dysphagia 01/22/2021   Type 2 diabetes mellitus with hyperglycemia 01/19/2021   COVID-19 virus infection 06/21/2020   Anemia 05/11/2020   Malignant neoplasm of right upper lobe of lung 08/07/2019   URI (upper respiratory infection) 09/16/2016   Health care maintenance 05/18/2016   History of lung cancer 02/08/2016   Abdominal aortic aneurysm 08/20/2015   Abnormal CXR 03/06/2014   Cough 03/06/2014   Headache(784.0) 03/06/2014   Hyperglycemia 02/07/2013   Hypertension 06/26/2012   Hypercholesterolemia 06/26/2012   Osteoporosis 06/26/2012   Hypercalcemia 06/26/2012   GERD (gastroesophageal reflux disease) 06/26/2012    ONSET DATE: 9 months, 10/2021  REFERRING DIAG: M62.81 (ICD-10-CM) - Muscle weakness (generalized)   THERAPY DIAG:  Muscle weakness (generalized)  Difficulty in walking, not elsewhere classified  Weakness of both hips  Rationale for Evaluation and Treatment: Rehabilitation  SUBJECTIVE:  SUBJECTIVE STATEMENT:  Patient reports no new issues- States her goal is to walk with a cane if able.    Pt accompanied by: self,  PERTINENT HISTORY:  Pt developed has history of cancer x2, but is in remission.  Pt has been on prednisone for 3 months, but should be off it in the next few days.  MD feels as though that has contributed to her weakness.  Pt has caregiver that comes over 3x week that helps washing clothes due to having some things on the ground  floor.  PAIN:  Are you having pain? No  PRECAUTIONS: Other: Cancer  WEIGHT BEARING RESTRICTIONS: No  FALLS: Has patient fallen in last 6 months? No  LIVING ENVIRONMENT: Lives with: lives alone Lives in: House/apartment Stairs: Yes: Internal: 13 steps; can reach both Has following equipment at home: Single point cane, Walker - 4 wheeled, shower chair, and Grab bars  PLOF: Independent and needing some assistance with driving.  PATIENT GOALS: To get stronger and stop utilizing the walker as much.  OBJECTIVE:   DIAGNOSTIC FINDINGS:   EXAM: NUCLEAR MEDICINE PET SKULL BASE TO THIGH  IMPRESSION: 1. No scintigraphic evidence of residual or recurrent esophageal hypermetabolism. 2. No evidence of hypermetabolic metastatic disease. 3. Similar aneurysmal dilation of the infrarenal abdominal aorta measuring 4.6 cm. Recommend follow-up CT/MR every 6 months and vascular consultation. This recommendation follows ACR consensus guidelines: White Paper of the ACR Incidental Findings Committee II on Vascular Findings. J Am Coll Radiol 2013; 28:786-767. 4. Aortic Atherosclerosis (ICD10-I70.0) and Emphysema (ICD10-J43.9).  COGNITION: Overall cognitive status: Within functional limits for tasks assessed   SENSATION: WFL  COORDINATION: WFL  POSTURE: No Significant postural limitations   LOWER EXTREMITY MMT:    MMT Right Eval Left Eval  Hip flexion 4 4  Hip abduction 4 4  Hip adduction 4- 4-  Knee flexion 4- 4-  Knee extension 4- 4-  Ankle dorsiflexion 4- 4-  (Blank rows = not tested)  FUNCTIONAL TESTS:  5 times sit to stand: 34.89 sec Timed up and go (TUG): 27.56 sec 6 minute walk test: 23' with use of rollator 10 meter walk test: 32.77 sec Berg Balance Scale: 37/56  PATIENT SURVEYS:  FOTO 45/54  TODAY'S TREATMENT: DATE: 09/16/22  THEREX:  Nustep BLE/BUE reciprocal endurance training x 5 min level 3 with cues for SPM 50-60, as well as full ROM.   Sit to stand  from chair- without UE support (arms cross chest)  x 10 reps (patient struggled initially - picked up pace and fatigued at end)  Calf raises from 1/2 foam roll in // bars - 2 sets of 15 reps Toe raises from 1/2 foam roll in // bars - 2 sets of 15 reps    NMR:    Obstacle course with use of SPC -  -Ambulating in gym with obstacles including: Airex pad (step up and perform 10 dynamic marches then down) ,  up and over 2 orange hurdles, narrow walk between several 1/2 spike balls, Up/down onto 6" step, up/over  2 of the 1/2 foam rolls positoned approx 3 feet apart; and up/over diagonally across balance balance beam- 3 rounds total with brief rest break between each trial.  Gait = 10 MWT (counting steps) x 2 trials  -1) 16.15 sec 26 steps  -2) 15.10 sec 23 steps  Heel to toe gait sequencing activity in //bars- positioned cone in front of patient and she performed 2 rounds of 10 reps (performing heel to toe gait  sequencing)   PATIENT EDUCATION: Education details:  Pt educated throughout session about proper posture and technique with exercises. Improved exercise technique, movement at target joints, use of target muscles after min to mod verbal, visual, tactile cues   Person educated: Patient and Child(ren) Education method: Explanation Education comprehension: verbalized understanding  HOME EXERCISE PROGRAM:  Access Code: [email or web address] URL: [email or web address] Date: 08/16/2022 Prepared by: Grier RocherAustin Tucker  Exercises - Seated Calf Stretch with Strap  - 1 x daily - 7 x weekly - 3 sets - 10 reps - Seated Calf Stretch knee bent with Strap  - 1 x daily - 7 x weekly - 3 sets - 10 reps - Standing Hip Flexor Stretch  - 1 x daily - 7 x weekly - 3 sets - 10 reps  Access Code: [email or web address] URL: [email or web address] Date: 07/14/2022 Prepared by: Tomasa HoseJosh Robbins  Exercises - Supine Bridge  - 1 x daily - 7 x weekly - 3 sets - 10 reps - Clamshell  - 1 x daily - 7 x weekly - 3  sets - 10 reps - Sidelying Hip Abduction  - 1 x daily - 7 x weekly - 3 sets - 10 reps - Supine Posterior Pelvic Tilt  - 1 x daily - 7 x weekly - 3 sets - 10 reps - Sidelying Reverse Clamshell  - 1 x daily - 7 x weekly - 3 sets - 10 reps - Active Straight Leg Raise with Quad Set  - 1 x daily - 7 x weekly - 3 sets - 10 reps  GOALS: Goals reviewed with patient? Yes  SHORT TERM GOALS: Target date: 08/09/2022  Pt will be independent with HEP in order to demonstrate increased ability to perform tasks related to occupation/hobbies. Baseline: Pt to be given HEP at next visit Goal status: IN PROGRESS  LONG TERM GOALS: Target date: 10/04/2022  1.  Patient (> 87 years old) will complete five times sit to stand test in < 15 seconds indicating an increased LE strength and improved balance. Baseline: 34.89 sec Goal status: IN PROGRESS  2.  Patient will increase FOTO score to equal to or greater than 54 to demonstrate statistically significant improvement in mobility and quality of life.  Baseline: FOTO: 45 Goal status: IN PROGRESS   3.  Patient will increase Berg Balance score by > 6 points to demonstrate decreased fall risk during functional activities. Baseline: BERG: 37  08/12/2022: 39  Goal status: IN PROGRESS   4.  Patient will reduce timed up and go to <11 seconds to reduce fall risk and demonstrate improved transfer/gait ability. Baseline: 27.56 sec 08/12/2022: 12.9 with no AD  Goal status: IN PROGRESS  5.  Patient will increase 10 meter walk test to >1.5350m/s as to improve gait speed for better community ambulation and to reduce fall risk. Baseline: 32.77 sec Goal status: IN PROGRESS  6.  Patient will increase six minute walk test distance to >1000 for progression to community ambulator and improve gait ability Baseline: 76745' with use of rollator 08/12/2022: 820' Goal status: IN PROGRESS    ASSESSMENT:  CLINICAL IMPRESSION:  Patient continues to present with good motivation for  today's session. She was challenged with obstacle course exhibiting some shuffling- improved with VC alone and able to clear all obstacles. She also demo progress with sit to stand with placing on hands on her knees today. She improved with step length with Verbal cues and practice but will continue to benefit from further training. Patient will benefit from continued  strength endurance and balance training as well as continued skilled therapy to address remaining deficits in order to improve overall QoL and return to PLOF.       OBJECTIVE IMPAIRMENTS: Abnormal gait, decreased activity tolerance, decreased balance, decreased endurance, decreased knowledge of use of DME, decreased mobility, difficulty walking, and decreased strength.   ACTIVITY LIMITATIONS: carrying, lifting, standing, squatting, stairs, transfers, bed mobility, continence, bathing, toileting, dressing, and locomotion level  PARTICIPATION LIMITATIONS: cleaning, laundry, driving, shopping, community activity, yard work, and church  PERSONAL FACTORS: Age, Fitness, Past/current experiences, and Time since onset of injury/illness/exacerbation are also affecting patient's functional outcome.   REHAB POTENTIAL: Good  CLINICAL DECISION MAKING: Stable/uncomplicated  EVALUATION COMPLEXITY: Low  PLAN:  PT FREQUENCY: 2x/week  PT DURATION: 12 weeks  PLANNED INTERVENTIONS: Therapeutic exercises, Therapeutic activity, Neuromuscular re-education, Balance training, Gait training, Patient/Family education, Self Care, Joint mobilization, Stair training, Vestibular training, Canalith repositioning, DME instructions, Aquatic Therapy, Dry Needling, Cryotherapy, Moist heat, Taping, Manual therapy, and Re-evaluation  PLAN FOR NEXT SESSION:   Continue to progress BLE strengthening program; dynamic balance training with Use of Blaze pods and airex pad    Louis Meckel, PT Physical Therapist - [facility]  [facility]   11:11 AM 09/16/22

## 2022-09-19 ENCOUNTER — Ambulatory Visit: Payer: Medicare PPO

## 2022-09-20 ENCOUNTER — Ambulatory Visit: Payer: Medicare PPO | Admitting: Physical Therapy

## 2022-09-20 DIAGNOSIS — M6281 Muscle weakness (generalized): Secondary | ICD-10-CM

## 2022-09-20 DIAGNOSIS — R29898 Other symptoms and signs involving the musculoskeletal system: Secondary | ICD-10-CM | POA: Diagnosis not present

## 2022-09-20 DIAGNOSIS — R262 Difficulty in walking, not elsewhere classified: Secondary | ICD-10-CM

## 2022-09-20 NOTE — Therapy (Signed)
OUTPATIENT PHYSICAL THERAPY NEURO TREATMENT    Patient Name: Heather Cooper MRN: [id number] DOB:1931-05-15, 89 y.o., female Today's Date: 09/20/2022   PCP: Dale [place], MD REFERRING PROVIDER: Louretta Shorten, MD  END OF SESSION:  PT End of Session - 09/20/22 1021     Visit Number 18    Number of Visits 24    Date for PT Re-Evaluation 10/04/22    Progress Note Due on Visit 20    PT Start Time 1017    PT Stop Time 1100    PT Time Calculation (min) 43 min    Equipment Utilized During Treatment Gait belt    Activity Tolerance Patient tolerated treatment well    Behavior During Therapy WFL for tasks assessed/performed                  Past Medical History:  Diagnosis Date   Chemotherapy induced nausea and vomiting    Chicken pox    Esophageal cancer    Hypercalcemia    familial hypocalciuric hypercalcemia   Hypercholesterolemia    Hypertension    Lung cancer    Osteoporosis    Thyroid disease    Past Surgical History:  Procedure Laterality Date   ABDOMINAL HYSTERECTOMY     partial   CHOLECYSTECTOMY     COLONOSCOPY WITH PROPOFOL N/A 04/17/2017   Procedure: COLONOSCOPY WITH PROPOFOL;  Surgeon: Scot Jun, MD;  Location: [facility] ENDOSCOPY;  Service: Endoscopy;  Laterality: N/A;   ESOPHAGOGASTRODUODENOSCOPY (EGD) WITH PROPOFOL N/A 04/13/2021   Procedure: ESOPHAGOGASTRODUODENOSCOPY (EGD) WITH PROPOFOL;  Surgeon: Midge Minium, MD;  Location: [facility] ENDOSCOPY;  Service: Endoscopy;  Laterality: N/A;   EUS N/A 04/22/2021   Procedure: FULL UPPER ENDOSCOPIC ULTRASOUND (EUS) RADIAL;  Surgeon: Rayann Heman, MD;  Location: [facility] ENDOSCOPY;  Service: Endoscopy;  Laterality: N/A;  [facility] needed   EUS N/A 10/28/2021   Procedure: UPPER ENDOSCOPIC ULTRASOUND (EUS) LINEAR;  Surgeon: Doren Custard, MD;  Location: [facility] ENDOSCOPY;  Service: Gastroenterology;  Laterality: N/A;  [facility]   transvaginal hysterectomy  04/18/05   with anterior colporrhaphy   Patient Active  Problem List   Diagnosis Date Noted   Encounter for immunotherapy 04/26/2022   Low TSH level 11/23/2021   PAD (peripheral artery disease) 08/28/2021   Emphysema of lung 08/28/2021   Esophageal cancer 05/30/2021   Diarrhea 05/30/2021   Neoplasm of middle third of esophagus 05/10/2021   Stricture and stenosis of esophagus    Dysphagia 01/22/2021   Type 2 diabetes mellitus with hyperglycemia 01/19/2021   COVID-19 virus infection 06/21/2020   Anemia 05/11/2020   Malignant neoplasm of right upper lobe of lung 08/07/2019   URI (upper respiratory infection) 09/16/2016   Health care maintenance 05/18/2016   History of lung cancer 02/08/2016   Abdominal aortic aneurysm 08/20/2015   Abnormal CXR 03/06/2014   Cough 03/06/2014   Headache(784.0) 03/06/2014   Hyperglycemia 02/07/2013   Hypertension 06/26/2012   Hypercholesterolemia 06/26/2012   Osteoporosis 06/26/2012   Hypercalcemia 06/26/2012   GERD (gastroesophageal reflux disease) 06/26/2012    ONSET DATE: 9 months, 10/2021  REFERRING DIAG: M62.81 (ICD-10-CM) - Muscle weakness (generalized)   THERAPY DIAG:  Muscle weakness (generalized)  Difficulty in walking, not elsewhere classified  Weakness of both hips  Rationale for Evaluation and Treatment: Rehabilitation  SUBJECTIVE:  SUBJECTIVE STATEMENT:  Patient reports no new issues. Reports feeling like  she is stronger and more confident with gait.    Pt accompanied by: self,  PERTINENT HISTORY:  Pt developed has history of cancer x2, but is in remission.  Pt has been on prednisone for 3 months, but should be off it in the next few days.  MD feels as though that has contributed to her weakness.  Pt has caregiver that comes over 3x week that helps washing clothes due to having some things on the  ground floor.  PAIN:  Are you having pain? No  PRECAUTIONS: Other: Cancer  WEIGHT BEARING RESTRICTIONS: No  FALLS: Has patient fallen in last 6 months? No  LIVING ENVIRONMENT: Lives with: lives alone Lives in: House/apartment Stairs: Yes: Internal: 13 steps; can reach both Has following equipment at home: Single point cane, Walker - 4 wheeled, shower chair, and Grab bars  PLOF: Independent and needing some assistance with driving.  PATIENT GOALS: To get stronger and stop utilizing the walker as much.  OBJECTIVE:   DIAGNOSTIC FINDINGS:   EXAM: NUCLEAR MEDICINE PET SKULL BASE TO THIGH  IMPRESSION: 1. No scintigraphic evidence of residual or recurrent esophageal hypermetabolism. 2. No evidence of hypermetabolic metastatic disease. 3. Similar aneurysmal dilation of the infrarenal abdominal aorta measuring 4.6 cm. Recommend follow-up CT/MR every 6 months and vascular consultation. This recommendation follows ACR consensus guidelines: White Paper of the ACR Incidental Findings Committee II on Vascular Findings. J Am Coll Radiol 2013; 16:109-604. 4. Aortic Atherosclerosis (ICD10-I70.0) and Emphysema (ICD10-J43.9).  COGNITION: Overall cognitive status: Within functional limits for tasks assessed   SENSATION: WFL  COORDINATION: WFL  POSTURE: No Significant postural limitations   LOWER EXTREMITY MMT:    MMT Right Eval Left Eval  Hip flexion 4 4  Hip abduction 4 4  Hip adduction 4- 4-  Knee flexion 4- 4-  Knee extension 4- 4-  Ankle dorsiflexion 4- 4-  (Blank rows = not tested)  FUNCTIONAL TESTS:  5 times sit to stand: 34.89 sec Timed up and go (TUG): 27.56 sec 6 minute walk test: 4' with use of rollator 10 meter walk test: 32.77 sec Berg Balance Scale: 37/56  PATIENT SURVEYS:  FOTO 45/54  TODAY'S TREATMENT: DATE: 09/20/22  THEREX:  Sit<>stand 3 x 10 with BUE pushing from thighs. Pt reports feeling muscle activation and fatigue in Bil quads at end  of each bout   Sitting  hip abduction RTB x 15  LAQ RTB x 12  HS curl x 12 BLE Hip flexion x 15RTB  Standing at rail in hall.  ankle PF/DF on level ground x 20  ankle PF/DF on airex beam x 5  hip abduction x 10 BLE RTB hip extension x 10 BLE RTB  hip flexion x 10 BLE RTB Forward lunge x 10 BLE  Lateral lunge x 10 BLE  Side stepping on ariex beam with UE support x 2 bil and no UE Support x 3 with min assist for safety.     Gait with 3# ankle weight x 135ft to force improved muscle recruitment in hip flexors and facilitate increased step length      PATIENT EDUCATION: Education details:  Pt educated throughout session about proper posture and technique with exercises. Improved exercise technique, movement at target joints, use of target muscles after min to mod verbal, visual, tactile cues   Person educated: Patient and Child(ren) Education method: Explanation Education comprehension: verbalized understanding  HOME EXERCISE PROGRAM:  Access Code:  [email or web address] URL: [email or web address] Date: 08/16/2022 Prepared by: Grier RocherAustin Katlin Bortner  Exercises - Seated Calf Stretch with Strap  - 1 x daily - 7 x weekly - 3 sets - 10 reps - Seated Calf Stretch knee bent with Strap  - 1 x daily - 7 x weekly - 3 sets - 10 reps - Standing Hip Flexor Stretch  - 1 x daily - 7 x weekly - 3 sets - 10 reps  Access Code: [email or web address] URL: [email or web address] Date: 07/14/2022 Prepared by: Tomasa HoseJosh Robbins  Exercises - Supine Bridge  - 1 x daily - 7 x weekly - 3 sets - 10 reps - Clamshell  - 1 x daily - 7 x weekly - 3 sets - 10 reps - Sidelying Hip Abduction  - 1 x daily - 7 x weekly - 3 sets - 10 reps - Supine Posterior Pelvic Tilt  - 1 x daily - 7 x weekly - 3 sets - 10 reps - Sidelying Reverse Clamshell  - 1 x daily - 7 x weekly - 3 sets - 10 reps - Active Straight Leg Raise with Quad Set  - 1 x daily - 7 x weekly - 3 sets - 10 reps  GOALS: Goals reviewed with patient?  Yes  SHORT TERM GOALS: Target date: 08/09/2022  Pt will be independent with HEP in order to demonstrate increased ability to perform tasks related to occupation/hobbies. Baseline: Pt to be given HEP at next visit Goal status: IN PROGRESS  LONG TERM GOALS: Target date: 10/04/2022  1.  Patient (> 87 years old) will complete five times sit to stand test in < 15 seconds indicating an increased LE strength and improved balance. Baseline: 34.89 sec Goal status: IN PROGRESS  2.  Patient will increase FOTO score to equal to or greater than 54 to demonstrate statistically significant improvement in mobility and quality of life.  Baseline: FOTO: 45 Goal status: IN PROGRESS   3.  Patient will increase Berg Balance score by > 6 points to demonstrate decreased fall risk during functional activities. Baseline: BERG: 37  08/12/2022: 39  Goal status: IN PROGRESS   4.  Patient will reduce timed up and go to <11 seconds to reduce fall risk and demonstrate improved transfer/gait ability. Baseline: 27.56 sec 08/12/2022: 12.9 with no AD  Goal status: IN PROGRESS  5.  Patient will increase 10 meter walk test to >1.3340m/s as to improve gait speed for better community ambulation and to reduce fall risk. Baseline: 32.77 sec Goal status: IN PROGRESS  6.  Patient will increase six minute walk test distance to >1000 for progression to community ambulator and improve gait ability Baseline: 19745' with use of rollator 08/12/2022: 820' Goal status: IN PROGRESS    ASSESSMENT:  CLINICAL IMPRESSION:  Patient continues to present with good motivation for today's session. PT treatment focused on BLE strengthening a improved ROM in BLE. Pt continues to report feeling stonger and more confident with gait, but has no appreciable improvement in gait pattern unless instruction provided by PT to narrow BOS and increase step length.  Patient will benefit from continued strength endurance and balance training as well as  continued skilled therapy to address remaining deficits in order to improve overall QoL and return to PLOF.       OBJECTIVE IMPAIRMENTS: Abnormal gait, decreased activity tolerance, decreased balance, decreased endurance, decreased knowledge of use of DME, decreased mobility, difficulty walking, and decreased strength.   ACTIVITY LIMITATIONS: carrying, lifting, standing, squatting, stairs, transfers, bed mobility, continence, bathing,  toileting, dressing, and locomotion level  PARTICIPATION LIMITATIONS: cleaning, laundry, driving, shopping, community activity, yard work, and church  PERSONAL FACTORS: Age, Fitness, Past/current experiences, and Time since onset of injury/illness/exacerbation are also affecting patient's functional outcome.   REHAB POTENTIAL: Good  CLINICAL DECISION MAKING: Stable/uncomplicated  EVALUATION COMPLEXITY: Low  PLAN:  PT FREQUENCY: 2x/week  PT DURATION: 12 weeks  PLANNED INTERVENTIONS: Therapeutic exercises, Therapeutic activity, Neuromuscular re-education, Balance training, Gait training, Patient/Family education, Self Care, Joint mobilization, Stair training, Vestibular training, Canalith repositioning, DME instructions, Aquatic Therapy, Dry Needling, Cryotherapy, Moist heat, Taping, Manual therapy, and Re-evaluation  PLAN FOR NEXT SESSION:   Continue to progress BLE strengthening program; dynamic balance and gait training to facilitate improved step length on BLE.   Grier Rocher PT, DPT  Physical Therapist - [facility]  11:46 AM 09/20/22

## 2022-09-23 ENCOUNTER — Ambulatory Visit: Payer: Medicare PPO | Admitting: Physical Therapy

## 2022-09-23 DIAGNOSIS — R29898 Other symptoms and signs involving the musculoskeletal system: Secondary | ICD-10-CM | POA: Diagnosis not present

## 2022-09-23 DIAGNOSIS — M6281 Muscle weakness (generalized): Secondary | ICD-10-CM

## 2022-09-23 DIAGNOSIS — R262 Difficulty in walking, not elsewhere classified: Secondary | ICD-10-CM | POA: Diagnosis not present

## 2022-09-23 NOTE — Therapy (Signed)
OUTPATIENT PHYSICAL THERAPY NEURO TREATMENT    Patient Name: Heather Cooper MRN: [id number] DOB:1931-05-15, 89 y.o., female Today's Date: 09/20/2022   PCP: Dale [place], MD REFERRING PROVIDER: Louretta Shorten, MD  END OF SESSION:  PT End of Session - 09/20/22 1021     Visit Number 18    Number of Visits 24    Date for PT Re-Evaluation 10/04/22    Progress Note Due on Visit 20    PT Start Time 1017    PT Stop Time 1100    PT Time Calculation (min) 43 min    Equipment Utilized During Treatment Gait belt    Activity Tolerance Patient tolerated treatment well    Behavior During Therapy WFL for tasks assessed/performed                  Past Medical History:  Diagnosis Date   Chemotherapy induced nausea and vomiting    Chicken pox    Esophageal cancer    Hypercalcemia    familial hypocalciuric hypercalcemia   Hypercholesterolemia    Hypertension    Lung cancer    Osteoporosis    Thyroid disease    Past Surgical History:  Procedure Laterality Date   ABDOMINAL HYSTERECTOMY     partial   CHOLECYSTECTOMY     COLONOSCOPY WITH PROPOFOL N/A 04/17/2017   Procedure: COLONOSCOPY WITH PROPOFOL;  Surgeon: Scot Jun, MD;  Location: [facility] ENDOSCOPY;  Service: Endoscopy;  Laterality: N/A;   ESOPHAGOGASTRODUODENOSCOPY (EGD) WITH PROPOFOL N/A 04/13/2021   Procedure: ESOPHAGOGASTRODUODENOSCOPY (EGD) WITH PROPOFOL;  Surgeon: Midge Minium, MD;  Location: [facility] ENDOSCOPY;  Service: Endoscopy;  Laterality: N/A;   EUS N/A 04/22/2021   Procedure: FULL UPPER ENDOSCOPIC ULTRASOUND (EUS) RADIAL;  Surgeon: Rayann Heman, MD;  Location: [facility] ENDOSCOPY;  Service: Endoscopy;  Laterality: N/A;  [facility] needed   EUS N/A 10/28/2021   Procedure: UPPER ENDOSCOPIC ULTRASOUND (EUS) LINEAR;  Surgeon: Doren Custard, MD;  Location: [facility] ENDOSCOPY;  Service: Gastroenterology;  Laterality: N/A;  [facility]   transvaginal hysterectomy  04/18/05   with anterior colporrhaphy   Patient Active  Problem List   Diagnosis Date Noted   Encounter for immunotherapy 04/26/2022   Low TSH level 11/23/2021   PAD (peripheral artery disease) 08/28/2021   Emphysema of lung 08/28/2021   Esophageal cancer 05/30/2021   Diarrhea 05/30/2021   Neoplasm of middle third of esophagus 05/10/2021   Stricture and stenosis of esophagus    Dysphagia 01/22/2021   Type 2 diabetes mellitus with hyperglycemia 01/19/2021   COVID-19 virus infection 06/21/2020   Anemia 05/11/2020   Malignant neoplasm of right upper lobe of lung 08/07/2019   URI (upper respiratory infection) 09/16/2016   Health care maintenance 05/18/2016   History of lung cancer 02/08/2016   Abdominal aortic aneurysm 08/20/2015   Abnormal CXR 03/06/2014   Cough 03/06/2014   Headache(784.0) 03/06/2014   Hyperglycemia 02/07/2013   Hypertension 06/26/2012   Hypercholesterolemia 06/26/2012   Osteoporosis 06/26/2012   Hypercalcemia 06/26/2012   GERD (gastroesophageal reflux disease) 06/26/2012    ONSET DATE: 9 months, 10/2021  REFERRING DIAG: M62.81 (ICD-10-CM) - Muscle weakness (generalized)   THERAPY DIAG:  Muscle weakness (generalized)  Difficulty in walking, not elsewhere classified  Weakness of both hips  Rationale for Evaluation and Treatment: Rehabilitation  SUBJECTIVE:  SUBJECTIVE STATEMENT:  Patient reports no new issues. Reports feeling like  she is stronger and more confident with gait.    Pt accompanied by: self,  PERTINENT HISTORY:  Pt developed has history of cancer x2, but is in remission.  Pt has been on prednisone for 3 months, but should be off it in the next few days.  MD feels as though that has contributed to her weakness.  Pt has caregiver that comes over 3x week that helps washing clothes due to having some things on the  ground floor.  PAIN:  Are you having pain? No  PRECAUTIONS: Other: Cancer  WEIGHT BEARING RESTRICTIONS: No  FALLS: Has patient fallen in last 6 months? No  LIVING ENVIRONMENT: Lives with: lives alone Lives in: House/apartment Stairs: Yes: Internal: 13 steps; can reach both Has following equipment at home: Single point cane, Walker - 4 wheeled, shower chair, and Grab bars  PLOF: Independent and needing some assistance with driving.  PATIENT GOALS: To get stronger and stop utilizing the walker as much.  OBJECTIVE:   DIAGNOSTIC FINDINGS:   EXAM: NUCLEAR MEDICINE PET SKULL BASE TO THIGH  IMPRESSION: 1. No scintigraphic evidence of residual or recurrent esophageal hypermetabolism. 2. No evidence of hypermetabolic metastatic disease. 3. Similar aneurysmal dilation of the infrarenal abdominal aorta measuring 4.6 cm. Recommend follow-up CT/MR every 6 months and vascular consultation. This recommendation follows ACR consensus guidelines: White Paper of the ACR Incidental Findings Committee II on Vascular Findings. J Am Coll Radiol 2013; 47:340-370. 4. Aortic Atherosclerosis (ICD10-I70.0) and Emphysema (ICD10-J43.9).  COGNITION: Overall cognitive status: Within functional limits for tasks assessed   SENSATION: WFL  COORDINATION: WFL  POSTURE: No Significant postural limitations   LOWER EXTREMITY MMT:    MMT Right Eval Left Eval  Hip flexion 4 4  Hip abduction 4 4  Hip adduction 4- 4-  Knee flexion 4- 4-  Knee extension 4- 4-  Ankle dorsiflexion 4- 4-  (Blank rows = not tested)  FUNCTIONAL TESTS:  5 times sit to stand: 34.89 sec Timed up and go (TUG): 27.56 sec 6 minute walk test: 40' with use of rollator 10 meter walk test: 32.77 sec Berg Balance Scale: 37/56  PATIENT SURVEYS:  FOTO 45/54  TODAY'S TREATMENT: DATE: 09/20/22  Nustep BLE and BUE endurance and reciprocal movement training. Level 0-2 x 5 min with cues for full ROM on  BLE  NMR: Standing balance in parallel bars  Stepping forward/back over bolster x 10 BLE with UE support and x 8 BLE with no UE support Side stepping over hurdle x 10 with BUE support and x 8 with no UE support   Sit<>stand from low mat table with no UE support 2 x 7. Cues to prevent BLE hitting mat table on eachbout.  Gait with SPC x 159ft with supervision assist from PT with cues for symmetry of step length on BLE.   Dynamic balance/gait training with SPC to navigate obstacle course up/down 2 4"steps, up/down airex pad, over large mat, over bolster and 5 5"hurdles.  performed CW/CCW. X 2 bouts  pt then performed addition bout CW/CCW with purse on L shoulder to simulate community access while holding purse. CGA from PT for safety with use of SPC   Ball toss off wall with lateral rotation x 10 then performed with 1 LE on aerobic 4inch step. X 12   Performed obstacle course as listed aboe without UE support CW/CCW min assist from PT for safety.     PATIENT EDUCATION:  Education details:  Pt educated throughout session about proper posture and technique with exercises. Improved exercise technique, movement at target joints, use of target muscles after min to mod verbal, visual, tactile cues   Person educated: Patient and Child(ren) Education method: Explanation Education comprehension: verbalized understanding  HOME EXERCISE PROGRAM:  Access Code: [email or web address] URL: [email or web address] Date: 08/16/2022 Prepared by: Grier Rocher  Exercises - Seated Calf Stretch with Strap  - 1 x daily - 7 x weekly - 3 sets - 10 reps - Seated Calf Stretch knee bent with Strap  - 1 x daily - 7 x weekly - 3 sets - 10 reps - Standing Hip Flexor Stretch  - 1 x daily - 7 x weekly - 3 sets - 10 reps  Access Code: [email or web address] URL: [email or web address] Date: 07/14/2022 Prepared by: Tomasa Hose  Exercises - Supine Bridge  - 1 x daily - 7 x weekly - 3 sets - 10 reps - Clamshell   - 1 x daily - 7 x weekly - 3 sets - 10 reps - Sidelying Hip Abduction  - 1 x daily - 7 x weekly - 3 sets - 10 reps - Supine Posterior Pelvic Tilt  - 1 x daily - 7 x weekly - 3 sets - 10 reps - Sidelying Reverse Clamshell  - 1 x daily - 7 x weekly - 3 sets - 10 reps - Active Straight Leg Raise with Quad Set  - 1 x daily - 7 x weekly - 3 sets - 10 reps  GOALS: Goals reviewed with patient? Yes  SHORT TERM GOALS: Target date: 08/09/2022  Pt will be independent with HEP in order to demonstrate increased ability to perform tasks related to occupation/hobbies. Baseline: Pt to be given HEP at next visit Goal status: IN PROGRESS  LONG TERM GOALS: Target date: 10/04/2022  1.  Patient (> 40 years old) will complete five times sit to stand test in < 15 seconds indicating an increased LE strength and improved balance. Baseline: 34.89 sec Goal status: IN PROGRESS  2.  Patient will increase FOTO score to equal to or greater than 54 to demonstrate statistically significant improvement in mobility and quality of life.  Baseline: FOTO: 45 Goal status: IN PROGRESS   3.  Patient will increase Berg Balance score by > 6 points to demonstrate decreased fall risk during functional activities. Baseline: BERG: 37  08/12/2022: 39  Goal status: IN PROGRESS   4.  Patient will reduce timed up and go to <11 seconds to reduce fall risk and demonstrate improved transfer/gait ability. Baseline: 27.56 sec 08/12/2022: 12.9 with no AD  Goal status: IN PROGRESS  5.  Patient will increase 10 meter walk test to >1.20m/s as to improve gait speed for better community ambulation and to reduce fall risk. Baseline: 32.77 sec Goal status: IN PROGRESS  6.  Patient will increase six minute walk test distance to >1000 for progression to community ambulator and improve gait ability Baseline: 73' with use of rollator 08/12/2022: 820' Goal status: IN PROGRESS    ASSESSMENT:  CLINICAL IMPRESSION:  Patient continues to present  with good motivation for today's session.PT treatment focused on dynamic/functional balance training with and without AD. Min assist required for safety with no UE support on [facility], but pt able to complete all dynamic balance tasks with supervision assist/CGA with use of SPC. Pt encouraged to utilize [facility] in house as able. Patient will benefit from continued strength endurance and balance training as well as continued skilled therapy to  address remaining deficits in order to improve overall QoL and return to PLOF.       OBJECTIVE IMPAIRMENTS: Abnormal gait, decreased activity tolerance, decreased balance, decreased endurance, decreased knowledge of use of DME, decreased mobility, difficulty walking, and decreased strength.   ACTIVITY LIMITATIONS: carrying, lifting, standing, squatting, stairs, transfers, bed mobility, continence, bathing, toileting, dressing, and locomotion level  PARTICIPATION LIMITATIONS: cleaning, laundry, driving, shopping, community activity, yard work, and church  PERSONAL FACTORS: Age, Fitness, Past/current experiences, and Time since onset of injury/illness/exacerbation are also affecting patient's functional outcome.   REHAB POTENTIAL: Good  CLINICAL DECISION MAKING: Stable/uncomplicated  EVALUATION COMPLEXITY: Low  PLAN:  PT FREQUENCY: 2x/week  PT DURATION: 12 weeks  PLANNED INTERVENTIONS: Therapeutic exercises, Therapeutic activity, Neuromuscular re-education, Balance training, Gait training, Patient/Family education, Self Care, Joint mobilization, Stair training, Vestibular training, Canalith repositioning, DME instructions, Aquatic Therapy, Dry Needling, Cryotherapy, Moist heat, Taping, Manual therapy, and Re-evaluation   PLAN FOR NEXT SESSION:   Continue to progress BLE strengthening program; dynamic balance and gait training to facilitate improved step length on BLE.    Grier Rocher PT, DPT  Physical Therapist - [facility]  11:46 AM 09/20/22

## 2022-09-27 ENCOUNTER — Ambulatory Visit: Payer: Medicare PPO | Admitting: Physical Therapy

## 2022-09-27 DIAGNOSIS — M6281 Muscle weakness (generalized): Secondary | ICD-10-CM

## 2022-09-27 DIAGNOSIS — R262 Difficulty in walking, not elsewhere classified: Secondary | ICD-10-CM

## 2022-09-27 DIAGNOSIS — R29898 Other symptoms and signs involving the musculoskeletal system: Secondary | ICD-10-CM | POA: Diagnosis not present

## 2022-09-27 NOTE — Therapy (Signed)
OUTPATIENT PHYSICAL THERAPY NEURO TREATMENT PHYSICAL THERAPY PROGRESS NOTE   Dates of reporting period  08/12/22   to   09/27/2022      Patient Name: Heather Cooper MRN: [id number] DOB:1931/05/26, 89 y.o., female Today's Date: 09/27/2022   PCP: Dale [place], MD REFERRING PROVIDER: Louretta Shorten, MD  END OF SESSION:  PT End of Session - 09/27/22 1021     Visit Number 20    Number of Visits 24    Date for PT Re-Evaluation 10/04/22    Progress Note Due on Visit 20    PT Start Time 1021    PT Stop Time 1103    PT Time Calculation (min) 42 min    Equipment Utilized During Treatment Gait belt    Activity Tolerance Patient tolerated treatment well    Behavior During Therapy WFL for tasks assessed/performed                  Past Medical History:  Diagnosis Date   Chemotherapy induced nausea and vomiting    Chicken pox    Esophageal cancer    Hypercalcemia    familial hypocalciuric hypercalcemia   Hypercholesterolemia    Hypertension    Lung cancer    Osteoporosis    Thyroid disease    Past Surgical History:  Procedure Laterality Date   ABDOMINAL HYSTERECTOMY     partial   CHOLECYSTECTOMY     COLONOSCOPY WITH PROPOFOL N/A 04/17/2017   Procedure: COLONOSCOPY WITH PROPOFOL;  Surgeon: Scot Jun, MD;  Location: [facility] ENDOSCOPY;  Service: Endoscopy;  Laterality: N/A;   ESOPHAGOGASTRODUODENOSCOPY (EGD) WITH PROPOFOL N/A 04/13/2021   Procedure: ESOPHAGOGASTRODUODENOSCOPY (EGD) WITH PROPOFOL;  Surgeon: Midge Minium, MD;  Location: [facility] ENDOSCOPY;  Service: Endoscopy;  Laterality: N/A;   EUS N/A 04/22/2021   Procedure: FULL UPPER ENDOSCOPIC ULTRASOUND (EUS) RADIAL;  Surgeon: Rayann Heman, MD;  Location: [facility] ENDOSCOPY;  Service: Endoscopy;  Laterality: N/A;  [facility] needed   EUS N/A 10/28/2021   Procedure: UPPER ENDOSCOPIC ULTRASOUND (EUS) LINEAR;  Surgeon: Doren Custard, MD;  Location: [facility] ENDOSCOPY;  Service: Gastroenterology;  Laterality: N/A;  [facility]   transvaginal hysterectomy  04/18/05   with anterior colporrhaphy   Patient Active Problem List   Diagnosis Date Noted   Encounter for immunotherapy 04/26/2022   Low TSH level 11/23/2021   PAD (peripheral artery disease) 08/28/2021   Emphysema of lung 08/28/2021   Esophageal cancer 05/30/2021   Diarrhea 05/30/2021   Neoplasm of middle third of esophagus 05/10/2021   Stricture and stenosis of esophagus    Dysphagia 01/22/2021   Type 2 diabetes mellitus with hyperglycemia 01/19/2021   COVID-19 virus infection 06/21/2020   Anemia 05/11/2020   Malignant neoplasm of right upper lobe of lung 08/07/2019   URI (upper respiratory infection) 09/16/2016   Health care maintenance 05/18/2016   History of lung cancer 02/08/2016   Abdominal aortic aneurysm 08/20/2015   Abnormal CXR 03/06/2014   Cough 03/06/2014   Headache(784.0) 03/06/2014   Hyperglycemia 02/07/2013   Hypertension 06/26/2012   Hypercholesterolemia 06/26/2012   Osteoporosis 06/26/2012   Hypercalcemia 06/26/2012   GERD (gastroesophageal reflux disease) 06/26/2012    ONSET DATE: 9 months, 10/2021  REFERRING DIAG: M62.81 (ICD-10-CM) - Muscle weakness (generalized)   THERAPY DIAG:  Muscle weakness (generalized)  Difficulty in walking, not elsewhere classified  Weakness of both hips  Rationale for Evaluation and Treatment: Rehabilitation  SUBJECTIVE:  SUBJECTIVE STATEMENT:  Patient reports no new issues. Reports feeling like  she is stronger and more confident with gait.    Pt accompanied by: self,  PERTINENT HISTORY:  Pt developed has history of cancer x2, but is in remission.  Pt has been on prednisone for 3 months, but should be off it in the next few days.  MD feels as though that has contributed to her weakness.  Pt has  caregiver that comes over 3x week that helps washing clothes due to having some things on the ground floor.  PAIN:  Are you having pain? No  PRECAUTIONS: Other: Cancer  WEIGHT BEARING RESTRICTIONS: No  FALLS: Has patient fallen in last 6 months? No  LIVING ENVIRONMENT: Lives with: lives alone Lives in: House/apartment Stairs: Yes: Internal: 13 steps; can reach both Has following equipment at home: Single point cane, Walker - 4 wheeled, shower chair, and Grab bars  PLOF: Independent and needing some assistance with driving.  PATIENT GOALS: To get stronger and stop utilizing the walker as much.  OBJECTIVE:   DIAGNOSTIC FINDINGS:   EXAM: NUCLEAR MEDICINE PET SKULL BASE TO THIGH  IMPRESSION: 1. No scintigraphic evidence of residual or recurrent esophageal hypermetabolism. 2. No evidence of hypermetabolic metastatic disease. 3. Similar aneurysmal dilation of the infrarenal abdominal aorta measuring 4.6 cm. Recommend follow-up CT/MR every 6 months and vascular consultation. This recommendation follows ACR consensus guidelines: White Paper of the ACR Incidental Findings Committee II on Vascular Findings. J Am Coll Radiol 2013; 16:109-604. 4. Aortic Atherosclerosis (ICD10-I70.0) and Emphysema (ICD10-J43.9).  COGNITION: Overall cognitive status: Within functional limits for tasks assessed   SENSATION: WFL  COORDINATION: WFL  POSTURE: No Significant postural limitations   LOWER EXTREMITY MMT:    MMT Right Eval Left Eval 4/16 R 4/16  L   Hip flexion 4 4 4 4   Hip abduction 4 4 4+ 4+  Hip adduction 4- 4- 4+ 4+  Knee flexion 4- 4- 4 4  Knee extension 4- 4- 4 4  Ankle dorsiflexion 4- 4- 4 4  (Blank rows = not tested)  FUNCTIONAL TESTS:  5 times sit to stand: 34.89 sec 4/16: with UE support 11.44; UE support on thighs: 11.43; no UE support 10.36   Timed up and go (TUG): 27.56 sec 4/16: 15.83  6 minute walk test: 74' with use of rollator 4/16: 817ft with  rollator 10 meter walk test: 32.77 sec 4/16 13.05 sec no UE support   Berg Balance Scale: 37/56: 4/16 44/56  PATIENT SURVEYS:  FOTO 44 4/16: 46   TODAY'S TREATMENT: DATE: 09/27/22   [facility] PT Assessment - 09/27/22 0001       Berg Balance Test   Sit to Stand Able to stand without using hands and stabilize independently    Standing Unsupported Able to stand safely 2 minutes    Sitting with Back Unsupported but Feet Supported on Floor or Stool Able to sit safely and securely 2 minutes    Stand to Sit Sits safely with minimal use of hands    Transfers Able to transfer safely, minor use of hands    Standing Unsupported with Eyes Closed Able to stand 10 seconds with supervision    Standing Unsupported with Feet Together Able to place feet together independently and stand 1 minute safely    From Standing, Reach Forward with Outstretched Arm Can reach forward >12 cm safely (5")    From Standing Position, Pick up Object from Floor Able to pick up shoe, needs supervision  From Standing Position, Turn to Look Behind Over each Shoulder Looks behind one side only/other side shows less weight shift    Turn 360 Degrees Able to turn 360 degrees safely but slowly    Standing Unsupported, Alternately Place Feet on Step/Stool Able to stand independently and complete 8 steps >20 seconds    Standing Unsupported, One Foot in Front Able to take small step independently and hold 30 seconds    Standing on One Leg Tries to lift leg/unable to hold 3 seconds but remains standing independently    Total Score 44              Pt performed progress note assessment to measure progress towards LTG. See above.    PATIENT EDUCATION: Education details:  Pt educated throughout session about proper posture and technique with exercises. Improved exercise technique, movement at target joints, use of target muscles after min to mod verbal, visual, tactile cues   Person educated: Patient and Child(ren) Education  method: Explanation Education comprehension: verbalized understanding  HOME EXERCISE PROGRAM:  Access Code: [email or web address] URL: [email or web address] Date: 08/16/2022 Prepared by: Grier Rocher  Exercises - Seated Calf Stretch with Strap  - 1 x daily - 7 x weekly - 3 sets - 10 reps - Seated Calf Stretch knee bent with Strap  - 1 x daily - 7 x weekly - 3 sets - 10 reps - Standing Hip Flexor Stretch  - 1 x daily - 7 x weekly - 3 sets - 10 reps  Access Code: [email or web address] URL: [email or web address] Date: 07/14/2022 Prepared by: Tomasa Hose  Exercises - Supine Bridge  - 1 x daily - 7 x weekly - 3 sets - 10 reps - Clamshell  - 1 x daily - 7 x weekly - 3 sets - 10 reps - Sidelying Hip Abduction  - 1 x daily - 7 x weekly - 3 sets - 10 reps - Supine Posterior Pelvic Tilt  - 1 x daily - 7 x weekly - 3 sets - 10 reps - Sidelying Reverse Clamshell  - 1 x daily - 7 x weekly - 3 sets - 10 reps - Active Straight Leg Raise with Quad Set  - 1 x daily - 7 x weekly - 3 sets - 10 reps  GOALS: Goals reviewed with patient? Yes  SHORT TERM GOALS: Target date: 08/09/2022  Pt will be independent with HEP in order to demonstrate increased ability to perform tasks related to occupation/hobbies. Baseline: Pt to be given HEP at next visit Goal status: IN PROGRESS  LONG TERM GOALS: Target date: 10/04/2022  1.  Patient (> 52 years old) will complete five times sit to stand test in < 10 seconds indicating an increased LE strength and improved balance. Baseline: 34.89 sec. 4/16 no UE support 10.36 Goal status: UPDATED  2.  Patient will increase FOTO score to equal to or greater than 54 to demonstrate statistically significant improvement in mobility and quality of life.  Baseline: FOTO: 44 4/16. 46  Goal status: IN PROGRESS   3.  Patient will increase Berg Balance score by >45 points to demonstrate decreased fall risk during functional activities. Baseline: BERG: 37  08/12/2022: 39.   4/16 44/56 Goal status: updated.    4.  Patient will reduce timed up and go to <11 seconds to reduce fall risk and demonstrate improved transfer/gait ability. Baseline: 27.56 sec 08/12/2022: 12.9 with no AD  4/16: 15.83  Goal status: IN PROGRESS  5.  Patient will increase 10 meter walk  test to >1.71m/s as to improve gait speed for better community ambulation and to reduce fall risk. Baseline: 32.77 sec. 13.05 sec no UE support   Goal status: IN PROGRESS  6.  Patient will increase six minute walk test distance to >1000 for progression to community ambulator and improve gait ability Baseline: 59' with use of rollator 08/12/2022: 820'. 4/16 817ft  Goal status: IN PROGRESS   ASSESSMENT:  CLINICAL IMPRESSION:  Patient put forht good effort with completion of standardized outcome assessments. Demonstrates improved strength and balance as evidenced by increased score on Berg to 44/56, 6 min walk test 860ft, improved 5xSTS to <15 sec and Tug in 15.53 sec. Pt goals adjusted to allow increased progress to reduced fall risk on Berg and 5x STS. Patient's condition has the potential to improve in response to therapy. Maximum improvement is yet to be obtained. The anticipated improvement is attainable and reasonable in a generally predictable time. Patient will benefit from continued strength endurance and balance training as well as continued skilled therapy to address remaining deficits in order to improve overall QoL and return to PLOF.       OBJECTIVE IMPAIRMENTS: Abnormal gait, decreased activity tolerance, decreased balance, decreased endurance, decreased knowledge of use of DME, decreased mobility, difficulty walking, and decreased strength.   ACTIVITY LIMITATIONS: carrying, lifting, standing, squatting, stairs, transfers, bed mobility, continence, bathing, toileting, dressing, and locomotion level  PARTICIPATION LIMITATIONS: cleaning, laundry, driving, shopping, community activity, yard work,  and church  PERSONAL FACTORS: Age, Fitness, Past/current experiences, and Time since onset of injury/illness/exacerbation are also affecting patient's functional outcome.   REHAB POTENTIAL: Good  CLINICAL DECISION MAKING: Stable/uncomplicated  EVALUATION COMPLEXITY: Low  PLAN:  PT FREQUENCY: 2x/week  PT DURATION: 12 weeks  PLANNED INTERVENTIONS: Therapeutic exercises, Therapeutic activity, Neuromuscular re-education, Balance training, Gait training, Patient/Family education, Self Care, Joint mobilization, Stair training, Vestibular training, Canalith repositioning, DME instructions, Aquatic Therapy, Dry Needling, Cryotherapy, Moist heat, Taping, Manual therapy, and Re-evaluation   PLAN FOR NEXT SESSION:   Continue to progress BLE strengthening program; dynamic balance and gait training to facilitate improved step length on BLE.    Grier Rocher PT, DPT  Physical Therapist - [facility]  10:24 AM 09/27/22

## 2022-09-30 ENCOUNTER — Ambulatory Visit: Payer: Medicare PPO | Admitting: Physical Therapy

## 2022-09-30 DIAGNOSIS — R262 Difficulty in walking, not elsewhere classified: Secondary | ICD-10-CM | POA: Diagnosis not present

## 2022-09-30 DIAGNOSIS — R29898 Other symptoms and signs involving the musculoskeletal system: Secondary | ICD-10-CM | POA: Diagnosis not present

## 2022-09-30 DIAGNOSIS — M6281 Muscle weakness (generalized): Secondary | ICD-10-CM

## 2022-09-30 NOTE — Therapy (Signed)
OUTPATIENT PHYSICAL THERAPY NEURO TREATMENT   Patient Name: Heather Cooper MRN: [id number] DOB:12/13/1930, 89 y.o., female Today's Date: 09/30/2022   PCP: Dale McNeil, MD REFERRING PROVIDER: Louretta Shorten, MD  END OF SESSION:  PT End of Session - 09/30/22 1016     Visit Number 21    Number of Visits 24    Date for PT Re-Evaluation 10/04/22    Progress Note Due on Visit 20    PT Start Time 1016    PT Stop Time 1101    PT Time Calculation (min) 45 min    Equipment Utilized During Treatment Gait belt    Activity Tolerance Patient tolerated treatment well    Behavior During Therapy WFL for tasks assessed/performed                  Past Medical History:  Diagnosis Date   Chemotherapy induced nausea and vomiting    Chicken pox    Esophageal cancer    Hypercalcemia    familial hypocalciuric hypercalcemia   Hypercholesterolemia    Hypertension    Lung cancer    Osteoporosis    Thyroid disease    Past Surgical History:  Procedure Laterality Date   ABDOMINAL HYSTERECTOMY     partial   CHOLECYSTECTOMY     COLONOSCOPY WITH PROPOFOL N/A 04/17/2017   Procedure: COLONOSCOPY WITH PROPOFOL;  Surgeon: Scot Jun, MD;  Location: [facility] ENDOSCOPY;  Service: Endoscopy;  Laterality: N/A;   ESOPHAGOGASTRODUODENOSCOPY (EGD) WITH PROPOFOL N/A 04/13/2021   Procedure: ESOPHAGOGASTRODUODENOSCOPY (EGD) WITH PROPOFOL;  Surgeon: Midge Minium, MD;  Location: [facility] ENDOSCOPY;  Service: Endoscopy;  Laterality: N/A;   EUS N/A 04/22/2021   Procedure: FULL UPPER ENDOSCOPIC ULTRASOUND (EUS) RADIAL;  Surgeon: Rayann Heman, MD;  Location: [facility] ENDOSCOPY;  Service: Endoscopy;  Laterality: N/A;  [facility] needed   EUS N/A 10/28/2021   Procedure: UPPER ENDOSCOPIC ULTRASOUND (EUS) LINEAR;  Surgeon: Doren Custard, MD;  Location: [facility] ENDOSCOPY;  Service: Gastroenterology;  Laterality: N/A;  [facility]   transvaginal hysterectomy  04/18/05   with anterior colporrhaphy   Patient Active  Problem List   Diagnosis Date Noted   Encounter for immunotherapy 04/26/2022   Low TSH level 11/23/2021   PAD (peripheral artery disease) 08/28/2021   Emphysema of lung 08/28/2021   Esophageal cancer 05/30/2021   Diarrhea 05/30/2021   Neoplasm of middle third of esophagus 05/10/2021   Stricture and stenosis of esophagus    Dysphagia 01/22/2021   Type 2 diabetes mellitus with hyperglycemia 01/19/2021   COVID-19 virus infection 06/21/2020   Anemia 05/11/2020   Malignant neoplasm of right upper lobe of lung 08/07/2019   URI (upper respiratory infection) 09/16/2016   Health care maintenance 05/18/2016   History of lung cancer 02/08/2016   Abdominal aortic aneurysm 08/20/2015   Abnormal CXR 03/06/2014   Cough 03/06/2014   Headache(784.0) 03/06/2014   Hyperglycemia 02/07/2013   Hypertension 06/26/2012   Hypercholesterolemia 06/26/2012   Osteoporosis 06/26/2012   Hypercalcemia 06/26/2012   GERD (gastroesophageal reflux disease) 06/26/2012    ONSET DATE: 9 months, 10/2021  REFERRING DIAG: M62.81 (ICD-10-CM) - Muscle weakness (generalized)   THERAPY DIAG:  Muscle weakness (generalized)  Difficulty in walking, not elsewhere classified  Weakness of both hips  Rationale for Evaluation and Treatment: Rehabilitation  SUBJECTIVE:  SUBJECTIVE STATEMENT:  Patient reports no new issues. No updates since last visit   Pt accompanied by: self,  PERTINENT HISTORY:  Pt developed has history of cancer x2, but is in remission.  Pt has been on prednisone for 3 months, but should be off it in the next few days.  MD feels as though that has contributed to her weakness.  Pt has caregiver that comes over 3x week that helps washing clothes due to having some things on the ground floor.  PAIN:  Are you having  pain? No  PRECAUTIONS: Other: Cancer  WEIGHT BEARING RESTRICTIONS: No  FALLS: Has patient fallen in last 6 months? No  LIVING ENVIRONMENT: Lives with: lives alone Lives in: House/apartment Stairs: Yes: Internal: 13 steps; can reach both Has following equipment at home: Single point cane, Walker - 4 wheeled, shower chair, and Grab bars  PLOF: Independent and needing some assistance with driving.  PATIENT GOALS: To get stronger and stop utilizing the walker as much.  OBJECTIVE:   DIAGNOSTIC FINDINGS:   EXAM: NUCLEAR MEDICINE PET SKULL BASE TO THIGH  IMPRESSION: 1. No scintigraphic evidence of residual or recurrent esophageal hypermetabolism. 2. No evidence of hypermetabolic metastatic disease. 3. Similar aneurysmal dilation of the infrarenal abdominal aorta measuring 4.6 cm. Recommend follow-up CT/MR every 6 months and vascular consultation. This recommendation follows ACR consensus guidelines: White Paper of the ACR Incidental Findings Committee II on Vascular Findings. J Am Coll Radiol 2013; 16:109-604. 4. Aortic Atherosclerosis (ICD10-I70.0) and Emphysema (ICD10-J43.9).  COGNITION: Overall cognitive status: Within functional limits for tasks assessed   SENSATION: WFL  COORDINATION: WFL  POSTURE: No Significant postural limitations   LOWER EXTREMITY MMT:    MMT Right Eval Left Eval 4/16 R 4/16  L   Hip flexion Hip abduction 4 4 4+ 4+  Hip adduction 4- 4- 4+ 4+  Knee flexion 4- 4- 4 4  Knee extension 4- 4- 4 4  Ankle dorsiflexion 4- 4- 4 4  (Blank rows = not tested)  FUNCTIONAL TESTS:  5 times sit to stand: 34.89 sec 4/16: with UE support 11.44; UE support on thighs: 11.43; no UE support 10.36   Timed up and go (TUG): 27.56 sec 4/16: 15.83  6 minute walk test: 74' with use of rollator 4/16: 850ft with rollator 10 meter walk test: 32.77 sec 4/16 13.05 sec no UE support   Berg Balance Scale: 37/56: 4/16 44/56  PATIENT SURVEYS:  FOTO 44  4/16: 46   TODAY'S TREATMENT: DATE: 09/30/22   Nustep reciprocal movement  and endurance training x 4 min level 2, cues for full ROM and decreased speed for last 1 min to allow completion of 4 min.   Dynmaic gait with SPC.  Over level surface x 122ft. Then Performed with 2.5# ankle weights 3x 17ft.  Pregait dynamic balance training:  Stepping over bolster in floor with 2.5# ankle weights 2 x 10 BLE.  Reciprocal foot tap on  6inch step 2x 10 BLE UE support on SPC Step up/down with SPC on 6" step 2 x 5 bil.   Sit<>stand will overhead ball press x 10  Sit<>stand with ball toss to PT x 10  Seated LAQ 19wth 2.5 # ankle weights.  Hip abduction with GTB x 12 Hip abduction/flexion over half bolster with 2.5 # ankle weights. X 12   Cues for full ROM and posture to reduce compensatory movements.   PATIENT EDUCATION: Education details:  Pt educated throughout session about proper  posture and technique with exercises. Improved exercise technique, movement at target joints, use of target muscles after min to mod verbal, visual, tactile cues   Person educated: Patient and Child(ren) Education method: Explanation Education comprehension: verbalized understanding  HOME EXERCISE PROGRAM:  Access Code: [email or web address] URL: [email or web address] Date: 08/16/2022 Prepared by: Grier Rocher  Exercises - Seated Calf Stretch with Strap  - 1 x daily - 7 x weekly - 3 sets - 10 reps - Seated Calf Stretch knee bent with Strap  - 1 x daily - 7 x weekly - 3 sets - 10 reps - Standing Hip Flexor Stretch  - 1 x daily - 7 x weekly - 3 sets - 10 reps  Access Code: [email or web address] URL: [email or web address] Date: 07/14/2022 Prepared by: Tomasa Hose  Exercises - Supine Bridge  - 1 x daily - 7 x weekly - 3 sets - 10 reps - Clamshell  - 1 x daily - 7 x weekly - 3 sets - 10 reps - Sidelying Hip Abduction  - 1 x daily - 7 x weekly - 3 sets - 10 reps - Supine Posterior Pelvic Tilt  - 1 x  daily - 7 x weekly - 3 sets - 10 reps - Sidelying Reverse Clamshell  - 1 x daily - 7 x weekly - 3 sets - 10 reps - Active Straight Leg Raise with Quad Set  - 1 x daily - 7 x weekly - 3 sets - 10 reps  GOALS: Goals reviewed with patient? Yes  SHORT TERM GOALS: Target date: 08/09/2022  Pt will be independent with HEP in order to demonstrate increased ability to perform tasks related to occupation/hobbies. Baseline: Pt to be given HEP at next visit Goal status: IN PROGRESS  LONG TERM GOALS: Target date: 10/04/2022  1.  Patient (> 38 years old) will complete five times sit to stand test in < 10 seconds indicating an increased LE strength and improved balance. Baseline: 34.89 sec. 4/16 no UE support 10.36 Goal status: UPDATED  2.  Patient will increase FOTO score to equal to or greater than 54 to demonstrate statistically significant improvement in mobility and quality of life.  Baseline: FOTO: 44 4/16. 46  Goal status: IN PROGRESS   3.  Patient will increase Berg Balance score by >45 points to demonstrate decreased fall risk during functional activities. Baseline: BERG: 37  08/12/2022: 39.  4/16 44/56 Goal status: updated.    4.  Patient will reduce timed up and go to <11 seconds to reduce fall risk and demonstrate improved transfer/gait ability. Baseline: 27.56 sec 08/12/2022: 12.9 with no AD  4/16: 15.83  Goal status: IN PROGRESS  5.  Patient will increase 10 meter walk test to >1.53m/s as to improve gait speed for better community ambulation and to reduce fall risk. Baseline: 32.77 sec. 13.05 sec no UE support   Goal status: IN PROGRESS  6.  Patient will increase six minute walk test distance to >1000 for progression to community ambulator and improve gait ability Baseline: 41' with use of rollator 08/12/2022: 820'. 4/16 822ft  Goal status: IN PROGRESS   ASSESSMENT:  CLINICAL IMPRESSION:  Patient put forth good effort towards all therapeutic activity on this day. Performed gait  with SPC and for support with dynamic balance training to improve safety with less restrictive AD. Assist required for step management with SPC to prevent LOB. Patient will benefit from continued strength endurance and balance training as well as continued skilled therapy to address remaining deficits in order  to improve overall QoL and return to PLOF.       OBJECTIVE IMPAIRMENTS: Abnormal gait, decreased activity tolerance, decreased balance, decreased endurance, decreased knowledge of use of DME, decreased mobility, difficulty walking, and decreased strength.   ACTIVITY LIMITATIONS: carrying, lifting, standing, squatting, stairs, transfers, bed mobility, continence, bathing, toileting, dressing, and locomotion level  PARTICIPATION LIMITATIONS: cleaning, laundry, driving, shopping, community activity, yard work, and church  PERSONAL FACTORS: Age, Fitness, Past/current experiences, and Time since onset of injury/illness/exacerbation are also affecting patient's functional outcome.   REHAB POTENTIAL: Good  CLINICAL DECISION MAKING: Stable/uncomplicated  EVALUATION COMPLEXITY: Low  PLAN:  PT FREQUENCY: 2x/week  PT DURATION: 12 weeks  PLANNED INTERVENTIONS: Therapeutic exercises, Therapeutic activity, Neuromuscular re-education, Balance training, Gait training, Patient/Family education, Self Care, Joint mobilization, Stair training, Vestibular training, Canalith repositioning, DME instructions, Aquatic Therapy, Dry Needling, Cryotherapy, Moist heat, Taping, Manual therapy, and Re-evaluation   PLAN FOR NEXT SESSION:   Continue to progress BLE strengthening program; dynamic balance and gait training to facilitate improved step length on BLE.    Grier Rocher PT, DPT  Physical Therapist - [facility]  11:50 AM 09/30/22

## 2022-10-04 ENCOUNTER — Ambulatory Visit: Payer: Medicare PPO | Admitting: Physical Therapy

## 2022-10-04 DIAGNOSIS — R262 Difficulty in walking, not elsewhere classified: Secondary | ICD-10-CM

## 2022-10-04 DIAGNOSIS — R29898 Other symptoms and signs involving the musculoskeletal system: Secondary | ICD-10-CM | POA: Diagnosis not present

## 2022-10-04 DIAGNOSIS — M6281 Muscle weakness (generalized): Secondary | ICD-10-CM

## 2022-10-04 NOTE — Therapy (Addendum)
OUTPATIENT PHYSICAL THERAPY NEURO TREATMENT/ RE-Certification   Patient Name: Heather Cooper MRN: [id number] DOB:May 13, 1931, 89 y.o., female Today's Date: 10/04/2022   PCP: Dale Carpendale, MD REFERRING PROVIDER: Louretta Shorten, MD  END OF SESSION:  PT End of Session - 10/04/22 1052     Visit Number 22    Number of Visits 46   Date for PT Re-Evaluation 12/27/22    Progress Note Due on Visit 30    PT Start Time 1016    PT Stop Time 1058    PT Time Calculation (min) 42 min    Equipment Utilized During Treatment Gait belt    Activity Tolerance Patient tolerated treatment well    Behavior During Therapy WFL for tasks assessed/performed                  Past Medical History:  Diagnosis Date   Chemotherapy induced nausea and vomiting    Chicken pox    Esophageal cancer    Hypercalcemia    familial hypocalciuric hypercalcemia   Hypercholesterolemia    Hypertension    Lung cancer    Osteoporosis    Thyroid disease    Past Surgical History:  Procedure Laterality Date   ABDOMINAL HYSTERECTOMY     partial   CHOLECYSTECTOMY     COLONOSCOPY WITH PROPOFOL N/A 04/17/2017   Procedure: COLONOSCOPY WITH PROPOFOL;  Surgeon: Scot Jun, MD;  Location: [facility] ENDOSCOPY;  Service: Endoscopy;  Laterality: N/A;   ESOPHAGOGASTRODUODENOSCOPY (EGD) WITH PROPOFOL N/A 04/13/2021   Procedure: ESOPHAGOGASTRODUODENOSCOPY (EGD) WITH PROPOFOL;  Surgeon: Midge Minium, MD;  Location: [facility] ENDOSCOPY;  Service: Endoscopy;  Laterality: N/A;   EUS N/A 04/22/2021   Procedure: FULL UPPER ENDOSCOPIC ULTRASOUND (EUS) RADIAL;  Surgeon: Rayann Heman, MD;  Location: [facility] ENDOSCOPY;  Service: Endoscopy;  Laterality: N/A;  [facility] needed   EUS N/A 10/28/2021   Procedure: UPPER ENDOSCOPIC ULTRASOUND (EUS) LINEAR;  Surgeon: Doren Custard, MD;  Location: [facility] ENDOSCOPY;  Service: Gastroenterology;  Laterality: N/A;  [facility]   transvaginal hysterectomy  04/18/05   with anterior colporrhaphy    Patient Active Problem List   Diagnosis Date Noted   Encounter for immunotherapy 04/26/2022   Low TSH level 11/23/2021   PAD (peripheral artery disease) 08/28/2021   Emphysema of lung 08/28/2021   Esophageal cancer 05/30/2021   Diarrhea 05/30/2021   Neoplasm of middle third of esophagus 05/10/2021   Stricture and stenosis of esophagus    Dysphagia 01/22/2021   Type 2 diabetes mellitus with hyperglycemia 01/19/2021   COVID-19 virus infection 06/21/2020   Anemia 05/11/2020   Malignant neoplasm of right upper lobe of lung 08/07/2019   URI (upper respiratory infection) 09/16/2016   Health care maintenance 05/18/2016   History of lung cancer 02/08/2016   Abdominal aortic aneurysm 08/20/2015   Abnormal CXR 03/06/2014   Cough 03/06/2014   Headache(784.0) 03/06/2014   Hyperglycemia 02/07/2013   Hypertension 06/26/2012   Hypercholesterolemia 06/26/2012   Osteoporosis 06/26/2012   Hypercalcemia 06/26/2012   GERD (gastroesophageal reflux disease) 06/26/2012    ONSET DATE: 9 months, 10/2021  REFERRING DIAG: M62.81 (ICD-10-CM) - Muscle weakness (generalized)   THERAPY DIAG:  Muscle weakness (generalized)  Difficulty in walking, not elsewhere classified  Weakness of both hips  Rationale for Evaluation and Treatment: Rehabilitation  SUBJECTIVE:  SUBJECTIVE STATEMENT:  Patient reports no new issues. Continues to feel stronger, but did not go out over the weekend due to bad weather.   Pt accompanied by: self,  PERTINENT HISTORY:  Pt developed has history of cancer x2, but is in remission.  Pt has been on prednisone for 3 months, but should be off it in the next few days.  MD feels as though that has contributed to her weakness.  Pt has caregiver that comes over 3x week that helps washing clothes  due to having some things on the ground floor.  PAIN:  Are you having pain? No  PRECAUTIONS: Other: Cancer  WEIGHT BEARING RESTRICTIONS: No  FALLS: Has patient fallen in last 6 months? No  LIVING ENVIRONMENT: Lives with: lives alone Lives in: House/apartment Stairs: Yes: Internal: 13 steps; can reach both Has following equipment at home: Single point cane, Walker - 4 wheeled, shower chair, and Grab bars  PLOF: Independent and needing some assistance with driving.  PATIENT GOALS: To get stronger and stop utilizing the walker as much.  OBJECTIVE:   DIAGNOSTIC FINDINGS:   EXAM: NUCLEAR MEDICINE PET SKULL BASE TO THIGH  IMPRESSION: 1. No scintigraphic evidence of residual or recurrent esophageal hypermetabolism. 2. No evidence of hypermetabolic metastatic disease. 3. Similar aneurysmal dilation of the infrarenal abdominal aorta measuring 4.6 cm. Recommend follow-up CT/MR every 6 months and vascular consultation. This recommendation follows ACR consensus guidelines: White Paper of the ACR Incidental Findings Committee II on Vascular Findings. J Am Coll Radiol 2013; 16:109-604. 4. Aortic Atherosclerosis (ICD10-I70.0) and Emphysema (ICD10-J43.9).  COGNITION: Overall cognitive status: Within functional limits for tasks assessed   SENSATION: WFL  COORDINATION: WFL  POSTURE: No Significant postural limitations   LOWER EXTREMITY MMT:    MMT Right Eval Left Eval 4/16 R 4/16  L   Hip flexion 4 4 4 4   Hip abduction 4 4 4+ 4+  Hip adduction 4- 4- 4+ 4+  Knee flexion 4- 4- 4 4  Knee extension 4- 4- 4 4  Ankle dorsiflexion 4- 4- 4 4  (Blank rows = not tested)  FUNCTIONAL TESTS:  5 times sit to stand: 34.89 sec 4/16: with UE support 11.44; UE support on thighs: 11.43; no UE support 10.36   Timed up and go (TUG): 27.56 sec 4/16: 15.83  6 minute walk test: 79' with use of rollator 4/16: 889ft with rollator 10 meter walk test: 32.77 sec 4/16 13.05 sec no UE support    Berg Balance Scale: 37/56: 4/16 44/56  PATIENT SURVEYS:  FOTO 44 4/16: 46   TODAY'S TREATMENT: DATE: 10/04/22   Seated therex with 4# ankle weights;  LAQ x 10 bil  Hip flexion x 10 bil  Hip abduction/adduction x 12   Standing therex at rail on wall with 4# ankle weight.  Hip abduction x 10 bil Hip extension x 10 bil  Ankle DF/PF x 15 Static standing calf stretch on wedge 2 x 1 min with cues for pelvic position to allow increased stretch after 30 sec on each bout.   Gait with 4# ankle weights with rollator 2 x 18ft and no UE support 2 x 162ft. Cues for step length on the LLE throughout and improved heel contact without use of AD.    PATIENT EDUCATION: Education details:  Pt educated throughout session about proper posture and technique with exercises. Improved exercise technique, movement at target joints, use of target muscles after min to mod verbal, visual, tactile cues   Person educated: Patient  and Child(ren) Education method: Explanation Education comprehension: verbalized understanding  HOME EXERCISE PROGRAM:  Access Code: [email or web address] URL: [email or web address] Date: 08/16/2022 Prepared by: Grier Rocher  Exercises - Seated Calf Stretch with Strap  - 1 x daily - 7 x weekly - 3 sets - 10 reps - Seated Calf Stretch knee bent with Strap  - 1 x daily - 7 x weekly - 3 sets - 10 reps - Standing Hip Flexor Stretch  - 1 x daily - 7 x weekly - 3 sets - 10 reps  Access Code: [email or web address] URL: [email or web address] Date: 07/14/2022 Prepared by: Tomasa Hose  Exercises - Supine Bridge  - 1 x daily - 7 x weekly - 3 sets - 10 reps - Clamshell  - 1 x daily - 7 x weekly - 3 sets - 10 reps - Sidelying Hip Abduction  - 1 x daily - 7 x weekly - 3 sets - 10 reps - Supine Posterior Pelvic Tilt  - 1 x daily - 7 x weekly - 3 sets - 10 reps - Sidelying Reverse Clamshell  - 1 x daily - 7 x weekly - 3 sets - 10 reps - Active Straight Leg Raise with Quad Set  -  1 x daily - 7 x weekly - 3 sets - 10 reps  GOALS: Goals reviewed with patient? Yes  SHORT TERM GOALS: Target date: 11/01/2022    Pt will be independent with HEP in order to demonstrate increased ability to perform tasks related to occupation/hobbies. Baseline: Pt to be given HEP at next visit Goal status: IN PROGRESS  LONG TERM GOALS: Target date: 12/27/2022    1.  Patient (> 14 years old) will complete five times sit to stand test in < 10 seconds indicating an increased LE strength and improved balance. Baseline: 34.89 sec. 4/16 no UE support 10.36 Goal status: UPDATED  2.  Patient will increase FOTO score to equal to or greater than 54 to demonstrate statistically significant improvement in mobility and quality of life.  Baseline: FOTO: 44 4/16. 46  Goal status: IN PROGRESS   3.  Patient will increase Berg Balance score by >45 points to demonstrate decreased fall risk during functional activities. Baseline: BERG: 37  08/12/2022: 39.  4/16 44/56 Goal status: updated.    4.  Patient will reduce timed up and go to <11 seconds to reduce fall risk and demonstrate improved transfer/gait ability. Baseline: 27.56 sec 08/12/2022: 12.9 with no AD  4/16: 15.83  Goal status: IN PROGRESS  5.  Patient will increase 10 meter walk test to >1.7m/s as to improve gait speed for better community ambulation and to reduce fall risk. Baseline: 32.77 sec. 13.05 sec no UE support   Goal status: IN PROGRESS  6.  Patient will increase six minute walk test distance to >1000 for progression to community ambulator and improve gait ability Baseline: 77' with use of rollator 08/12/2022: 820'. 4/16 852ft  Goal status: IN PROGRESS   ASSESSMENT:  CLINICAL IMPRESSION:  Patient put forth excellent effort towards all therapeutic activity on this day. PT treatment focused on BLE strengthening and gait with and without AD an d resistance added. Pt demonstrates improved ability to perform therex with increased  weight load on this day. Patient's condition has the potential to improve in response to therapy. Maximum improvement is yet to be obtained. The anticipated improvement is attainable and reasonable in a generally predictable time.  Patient will benefit from continued strength endurance and balance training as well as continued  skilled therapy to address remaining deficits in order to improve overall QoL and return to PLOF.       OBJECTIVE IMPAIRMENTS: Abnormal gait, decreased activity tolerance, decreased balance, decreased endurance, decreased knowledge of use of DME, decreased mobility, difficulty walking, and decreased strength.   ACTIVITY LIMITATIONS: carrying, lifting, standing, squatting, stairs, transfers, bed mobility, continence, bathing, toileting, dressing, and locomotion level  PARTICIPATION LIMITATIONS: cleaning, laundry, driving, shopping, community activity, yard work, and church  PERSONAL FACTORS: Age, Fitness, Past/current experiences, and Time since onset of injury/illness/exacerbation are also affecting patient's functional outcome.   REHAB POTENTIAL: Good  CLINICAL DECISION MAKING: Stable/uncomplicated  EVALUATION COMPLEXITY: Low  PLAN:  PT FREQUENCY: 2x/week  PT DURATION: 12 weeks  PLANNED INTERVENTIONS: Therapeutic exercises, Therapeutic activity, Neuromuscular re-education, Balance training, Gait training, Patient/Family education, Self Care, Joint mobilization, Stair training, Vestibular training, Canalith repositioning, DME instructions, Aquatic Therapy, Dry Needling, Cryotherapy, Moist heat, Taping, Manual therapy, and Re-evaluation   PLAN FOR NEXT SESSION:   Continue to progress BLE strengthening program; dynamic balance and gait training to facilitate improved step length on BLE.    Grier Rocher PT, DPT  Physical Therapist - [facility]  10:59 AM 10/04/22

## 2022-10-07 ENCOUNTER — Ambulatory Visit: Payer: Medicare PPO | Admitting: Physical Therapy

## 2022-10-07 DIAGNOSIS — R29898 Other symptoms and signs involving the musculoskeletal system: Secondary | ICD-10-CM

## 2022-10-07 DIAGNOSIS — R262 Difficulty in walking, not elsewhere classified: Secondary | ICD-10-CM | POA: Diagnosis not present

## 2022-10-07 DIAGNOSIS — M6281 Muscle weakness (generalized): Secondary | ICD-10-CM | POA: Diagnosis not present

## 2022-10-07 NOTE — Addendum Note (Signed)
Addended by: Golden Pop on: 10/07/2022 10:26 AM   Modules accepted: Orders

## 2022-10-07 NOTE — Therapy (Signed)
OUTPATIENT PHYSICAL THERAPY NEURO TREATMENT/ RE-Certification   Patient Name: Heather Cooper MRN: [id number] DOB:1930/08/08, 89 y.o., female Today's Date: 10/07/2022   PCP: Dale Glen, MD REFERRING PROVIDER: Louretta Shorten, MD  END OF SESSION:  PT End of Session - 10/07/22 1015     Visit Number 23    Number of Visits 46    Date for PT Re-Evaluation 12/27/22    Progress Note Due on Visit 30    PT Start Time 1018    Equipment Utilized During Treatment Gait belt    Activity Tolerance Patient tolerated treatment well    Behavior During Therapy [facility] for tasks assessed/performed                  Past Medical History:  Diagnosis Date   Chemotherapy induced nausea and vomiting    Chicken pox    Esophageal cancer (HCC)    Hypercalcemia    familial hypocalciuric hypercalcemia   Hypercholesterolemia    Hypertension    Lung cancer (HCC)    Osteoporosis    Thyroid disease    Past Surgical History:  Procedure Laterality Date   ABDOMINAL HYSTERECTOMY     partial   CHOLECYSTECTOMY     COLONOSCOPY WITH PROPOFOL N/A 04/17/2017   Procedure: COLONOSCOPY WITH PROPOFOL;  Surgeon: Scot Jun, MD;  Location: [facility] ENDOSCOPY;  Service: Endoscopy;  Laterality: N/A;   ESOPHAGOGASTRODUODENOSCOPY (EGD) WITH PROPOFOL N/A 04/13/2021   Procedure: ESOPHAGOGASTRODUODENOSCOPY (EGD) WITH PROPOFOL;  Surgeon: Midge Minium, MD;  Location: [facility] ENDOSCOPY;  Service: Endoscopy;  Laterality: N/A;   EUS N/A 04/22/2021   Procedure: FULL UPPER ENDOSCOPIC ULTRASOUND (EUS) RADIAL;  Surgeon: Rayann Heman, MD;  Location: [facility] ENDOSCOPY;  Service: Endoscopy;  Laterality: N/A;  [facility] needed   EUS N/A 10/28/2021   Procedure: UPPER ENDOSCOPIC ULTRASOUND (EUS) LINEAR;  Surgeon: Doren Custard, MD;  Location: [facility] ENDOSCOPY;  Service: Gastroenterology;  Laterality: N/A;  [facility]   transvaginal hysterectomy  04/18/05   with anterior colporrhaphy   Patient Active Problem List   Diagnosis Date  Noted   Encounter for immunotherapy 04/26/2022   Low TSH level 11/23/2021   PAD (peripheral artery disease) (HCC) 08/28/2021   Emphysema of lung (HCC) 08/28/2021   Esophageal cancer (HCC) 05/30/2021   Diarrhea 05/30/2021   Neoplasm of middle third of esophagus 05/10/2021   Stricture and stenosis of esophagus    Dysphagia 01/22/2021   Type 2 diabetes mellitus with hyperglycemia (HCC) 01/19/2021   COVID-19 virus infection 06/21/2020   Anemia 05/11/2020   Malignant neoplasm of right upper lobe of lung (HCC) 08/07/2019   URI (upper respiratory infection) 09/16/2016   Health care maintenance 05/18/2016   History of lung cancer 02/08/2016   Abdominal aortic aneurysm (HCC) 08/20/2015   Abnormal CXR 03/06/2014   Cough 03/06/2014   Headache(784.0) 03/06/2014   Hyperglycemia 02/07/2013   Hypertension 06/26/2012   Hypercholesterolemia 06/26/2012   Osteoporosis 06/26/2012   Hypercalcemia 06/26/2012   GERD (gastroesophageal reflux disease) 06/26/2012    ONSET DATE: 9 months, 10/2021  REFERRING DIAG: M62.81 (ICD-10-CM) - Muscle weakness (generalized)   THERAPY DIAG:  Muscle weakness (generalized)  Difficulty in walking, not elsewhere classified  Weakness of both hips  Rationale for Evaluation and Treatment: Rehabilitation  SUBJECTIVE:  SUBJECTIVE STATEMENT:  Patient reports no new issues. Reports that she will be celebrating her son's birthday over the weekend.      Pt accompanied by: self,  PERTINENT HISTORY:  Pt developed has history of cancer x2, but is in remission.  Pt has been on prednisone for 3 months, but should be off it in the next few days.  MD feels as though that has contributed to her weakness.  Pt has caregiver that comes over 3x week that helps washing clothes due to having some  things on the ground floor.  PAIN:  Are you having pain? No  PRECAUTIONS: Other: Cancer  WEIGHT BEARING RESTRICTIONS: No  FALLS: Has patient fallen in last 6 months? No  LIVING ENVIRONMENT: Lives with: lives alone Lives in: House/apartment Stairs: Yes: Internal: 13 steps; can reach both Has following equipment at home: Single point cane, Walker - 4 wheeled, shower chair, and Grab bars  PLOF: Independent and needing some assistance with driving.  PATIENT GOALS: To get stronger and stop utilizing the walker as much.  OBJECTIVE:   DIAGNOSTIC FINDINGS:   EXAM: NUCLEAR MEDICINE PET SKULL BASE TO THIGH  IMPRESSION: 1. No scintigraphic evidence of residual or recurrent esophageal hypermetabolism. 2. No evidence of hypermetabolic metastatic disease. 3. Similar aneurysmal dilation of the infrarenal abdominal aorta measuring 4.6 cm. Recommend follow-up CT/MR every 6 months and vascular consultation. This recommendation follows ACR consensus guidelines: White Paper of the ACR Incidental Findings Committee II on Vascular Findings. J Am Coll Radiol 2013; 16:109-604. 4. Aortic Atherosclerosis (ICD10-I70.0) and Emphysema (ICD10-J43.9).  COGNITION: Overall cognitive status: Within functional limits for tasks assessed   SENSATION: WFL  COORDINATION: WFL  POSTURE: No Significant postural limitations   LOWER EXTREMITY MMT:    MMT Right Eval Left Eval 4/16 R 4/16  L   Hip flexion 4 4 4 4   Hip abduction 4 4 4+ 4+  Hip adduction 4- 4- 4+ 4+  Knee flexion 4- 4- 4 4  Knee extension 4- 4- 4 4  Ankle dorsiflexion 4- 4- 4 4  (Blank rows = not tested)  FUNCTIONAL TESTS:  5 times sit to stand: 34.89 sec 4/16: with UE support 11.44; UE support on thighs: 11.43; no UE support 10.36   Timed up and go (TUG): 27.56 sec 4/16: 15.83  6 minute walk test: 73' with use of rollator 4/16: 870ft with rollator 10 meter walk test: 32.77 sec 4/16 13.05 sec no UE support   Berg Balance  Scale: 37/56: 4/16 44/56  PATIENT SURVEYS:  FOTO 44 4/16: 46   TODAY'S TREATMENT: DATE: 10/07/22   Nustep level 3 x 3 min + 2 min with therapeutic rest break between bouts.    Standing therex in parallel bars with 4# ankle weight.  Hip abduction x 10 bil Hip extension x 10 bil  Hip flexion  Ankle DF/PF x 15 Forwards step up on 6inch step x 8 bil  Lateral step ups x 8 bil   Dynamic balance training with use of blaze pods  Activity Description: The Blaze Pod Random Number of Pods:  5 Cycles/Sets:  2 Duration (Time or Hit Count):  1.5 min  Patient Stats  Hits:   9 and 8   CGA from PT for safety with no AD and cues for step length and weight shift prior to perform weight shift prior to initiating foot tap on target.  Gait with  SPC 2 x 158ft with cues for increased step length throughout.    PATIENT  EDUCATION: Education details:  Pt educated throughout session about proper posture and technique with exercises. Improved exercise technique, movement at target joints, use of target muscles after min to mod verbal, visual, tactile cues   Person educated: Patient and Child(ren) Education method: Explanation Education comprehension: verbalized understanding  HOME EXERCISE PROGRAM:  Access Code: [email or web address] URL: [email or web address] Date: 08/16/2022 Prepared by: Grier Rocher  Exercises - Seated Calf Stretch with Strap  - 1 x daily - 7 x weekly - 3 sets - 10 reps - Seated Calf Stretch knee bent with Strap  - 1 x daily - 7 x weekly - 3 sets - 10 reps - Standing Hip Flexor Stretch  - 1 x daily - 7 x weekly - 3 sets - 10 reps  Access Code: [email or web address] URL: [email or web address] Date: 07/14/2022 Prepared by: Tomasa Hose  Exercises - Supine Bridge  - 1 x daily - 7 x weekly - 3 sets - 10 reps - Clamshell  - 1 x daily - 7 x weekly - 3 sets - 10 reps - Sidelying Hip Abduction  - 1 x daily - 7 x weekly - 3 sets - 10 reps - Supine Posterior Pelvic Tilt  -  1 x daily - 7 x weekly - 3 sets - 10 reps - Sidelying Reverse Clamshell  - 1 x daily - 7 x weekly - 3 sets - 10 reps - Active Straight Leg Raise with Quad Set  - 1 x daily - 7 x weekly - 3 sets - 10 reps  GOALS: Goals reviewed with patient? Yes  SHORT TERM GOALS: Target date: 11/01/2022    Pt will be independent with HEP in order to demonstrate increased ability to perform tasks related to occupation/hobbies. Baseline: Pt to be given HEP at next visit Goal status: IN PROGRESS  LONG TERM GOALS: Target date: 12/27/2022    1.  Patient (> 17 years old) will complete five times sit to stand test in < 10 seconds indicating an increased LE strength and improved balance. Baseline: 34.89 sec. 4/16 no UE support 10.36 Goal status: UPDATED  2.  Patient will increase FOTO score to equal to or greater than 54 to demonstrate statistically significant improvement in mobility and quality of life.  Baseline: FOTO: 44 4/16. 46  Goal status: IN PROGRESS   3.  Patient will increase Berg Balance score by >45 points to demonstrate decreased fall risk during functional activities. Baseline: BERG: 37  08/12/2022: 39.  4/16 44/56 Goal status: updated.    4.  Patient will reduce timed up and go to <11 seconds to reduce fall risk and demonstrate improved transfer/gait ability. Baseline: 27.56 sec 08/12/2022: 12.9 with no AD  4/16: 15.83  Goal status: IN PROGRESS  5.  Patient will increase 10 meter walk test to >1.68m/s as to improve gait speed for better community ambulation and to reduce fall risk. Baseline: 32.77 sec. 13.05 sec no UE support   Goal status: IN PROGRESS  6.  Patient will increase six minute walk test distance to >1000 for progression to community ambulator and improve gait ability Baseline: 37' with use of rollator 08/12/2022: 820'. 4/16 841ft  Goal status: IN PROGRESS   ASSESSMENT:  CLINICAL IMPRESSION:  Patient put forth excellent effort towards all therapeutic activity on this  day. Pt able to perform standing therex today with reduced compensation  and improved technique as well as increased safety with no LOB while performing home base blaze pod task. The [facility] Random setting was chosen  to enhance cognitive processing and agility, providing an unpredictable environment to simulate real-world scenarios, and fostering quick reactions and adaptability.   Patient will benefit from continued strength endurance and balance training as well as continued skilled therapy to address remaining deficits in order to improve overall QoL and return to PLOF.       OBJECTIVE IMPAIRMENTS: Abnormal gait, decreased activity tolerance, decreased balance, decreased endurance, decreased knowledge of use of DME, decreased mobility, difficulty walking, and decreased strength.   ACTIVITY LIMITATIONS: carrying, lifting, standing, squatting, stairs, transfers, bed mobility, continence, bathing, toileting, dressing, and locomotion level  PARTICIPATION LIMITATIONS: cleaning, laundry, driving, shopping, community activity, yard work, and church  PERSONAL FACTORS: Age, Fitness, Past/current experiences, and Time since onset of injury/illness/exacerbation are also affecting patient's functional outcome.   REHAB POTENTIAL: Good  CLINICAL DECISION MAKING: Stable/uncomplicated  EVALUATION COMPLEXITY: Low  PLAN:  PT FREQUENCY: 2x/week  PT DURATION: 12 weeks  PLANNED INTERVENTIONS: Therapeutic exercises, Therapeutic activity, Neuromuscular re-education, Balance training, Gait training, Patient/Family education, Self Care, Joint mobilization, Stair training, Vestibular training, Canalith repositioning, DME instructions, Aquatic Therapy, Dry Needling, Cryotherapy, Moist heat, Taping, Manual therapy, and Re-evaluation   PLAN FOR NEXT SESSION:   Continue to progress BLE strengthening program; dynamic balance and gait training to facilitate improved step length on BLE.    Grier Rocher PT,  DPT  Physical Therapist - [place]  [facility]  10:33 AM 10/07/22

## 2022-10-11 ENCOUNTER — Ambulatory Visit: Payer: Medicare PPO | Admitting: Physical Therapy

## 2022-10-11 DIAGNOSIS — M6281 Muscle weakness (generalized): Secondary | ICD-10-CM

## 2022-10-11 DIAGNOSIS — R29898 Other symptoms and signs involving the musculoskeletal system: Secondary | ICD-10-CM | POA: Diagnosis not present

## 2022-10-11 DIAGNOSIS — R262 Difficulty in walking, not elsewhere classified: Secondary | ICD-10-CM

## 2022-10-11 NOTE — Therapy (Signed)
OUTPATIENT PHYSICAL THERAPY NEURO TREATMENT/ RE-Certification   Patient Name: JASAMINE POTTINGER MRN: [id number] DOB:[birth year], 89 y.o., female Today's Date: 10/11/2022   PCP: Dale Levy, MD REFERRING PROVIDER: Louretta Shorten, MD  END OF SESSION:  PT End of Session - 10/11/22 1023     Visit Number 24    Number of Visits 46    Date for PT Re-Evaluation 12/27/22    Progress Note Due on Visit 30    PT Start Time 1020    PT Stop Time 1100    PT Time Calculation (min) 40 min    Equipment Utilized During Treatment Gait belt    Activity Tolerance Patient tolerated treatment well    Behavior During Therapy WFL for tasks assessed/performed                  Past Medical History:  Diagnosis Date   Chemotherapy induced nausea and vomiting    Chicken pox    Esophageal cancer (HCC)    Hypercalcemia    familial hypocalciuric hypercalcemia   Hypercholesterolemia    Hypertension    Lung cancer (HCC)    Osteoporosis    Thyroid disease    Past Surgical History:  Procedure Laterality Date   ABDOMINAL HYSTERECTOMY     partial   CHOLECYSTECTOMY     COLONOSCOPY WITH PROPOFOL N/A 04/17/2017   Procedure: COLONOSCOPY WITH PROPOFOL;  Surgeon: Scot Jun, MD;  Location: [facility] ENDOSCOPY;  Service: Endoscopy;  Laterality: N/A;   ESOPHAGOGASTRODUODENOSCOPY (EGD) WITH PROPOFOL N/A 04/13/2021   Procedure: ESOPHAGOGASTRODUODENOSCOPY (EGD) WITH PROPOFOL;  Surgeon: Midge Minium, MD;  Location: [facility] ENDOSCOPY;  Service: Endoscopy;  Laterality: N/A;   EUS N/A 04/22/2021   Procedure: FULL UPPER ENDOSCOPIC ULTRASOUND (EUS) RADIAL;  Surgeon: Rayann Heman, MD;  Location: [facility] ENDOSCOPY;  Service: Endoscopy;  Laterality: N/A;  [facility] needed   EUS N/A 10/28/2021   Procedure: UPPER ENDOSCOPIC ULTRASOUND (EUS) LINEAR;  Surgeon: Doren Custard, MD;  Location: [facility] ENDOSCOPY;  Service: Gastroenterology;  Laterality: N/A;  [facility]   transvaginal hysterectomy  04/18/05   with anterior  colporrhaphy   Patient Active Problem List   Diagnosis Date Noted   Encounter for immunotherapy 04/26/2022   Low TSH level 11/23/2021   PAD (peripheral artery disease) (HCC) 08/28/2021   Emphysema of lung (HCC) 08/28/2021   Esophageal cancer (HCC) 05/30/2021   Diarrhea 05/30/2021   Neoplasm of middle third of esophagus 05/10/2021   Stricture and stenosis of esophagus    Dysphagia 01/22/2021   Type 2 diabetes mellitus with hyperglycemia (HCC) 01/19/2021   COVID-19 virus infection 06/21/2020   Anemia 05/11/2020   Malignant neoplasm of right upper lobe of lung (HCC) 08/07/2019   URI (upper respiratory infection) 09/16/2016   Health care maintenance 05/18/2016   History of lung cancer 02/08/2016   Abdominal aortic aneurysm (HCC) 08/20/2015   Abnormal CXR 03/06/2014   Cough 03/06/2014   Headache(784.0) 03/06/2014   Hyperglycemia 02/07/2013   Hypertension 06/26/2012   Hypercholesterolemia 06/26/2012   Osteoporosis 06/26/2012   Hypercalcemia 06/26/2012   GERD (gastroesophageal reflux disease) 06/26/2012    ONSET DATE: 9 months, 10/2021  REFERRING DIAG: M62.81 (ICD-10-CM) - Muscle weakness (generalized)   THERAPY DIAG:  Muscle weakness (generalized)  Difficulty in walking, not elsewhere classified  Weakness of both hips  Rationale for Evaluation and Treatment: Rehabilitation  SUBJECTIVE:  SUBJECTIVE STATEMENT:  Patient reports no new issues. Had a good weekend, was able to go to church using the [facility] to access community environment.      Pt accompanied by: self,  PERTINENT HISTORY:  Pt developed has history of cancer x2, but is in remission.  Pt has been on prednisone for 3 months, but should be off it in the next few days.  MD feels as though that has contributed to her weakness.  Pt has  caregiver that comes over 3x week that helps washing clothes due to having some things on the ground floor.  PAIN:  Are you having pain? No  PRECAUTIONS: Other: Cancer  WEIGHT BEARING RESTRICTIONS: No  FALLS: Has patient fallen in last 6 months? No  LIVING ENVIRONMENT: Lives with: lives alone Lives in: House/apartment Stairs: Yes: Internal: 13 steps; can reach both Has following equipment at home: Single point cane, Walker - 4 wheeled, shower chair, and Grab bars  PLOF: Independent and needing some assistance with driving.  PATIENT GOALS: To get stronger and stop utilizing the walker as much.  OBJECTIVE:   DIAGNOSTIC FINDINGS:   EXAM: NUCLEAR MEDICINE PET SKULL BASE TO THIGH  IMPRESSION: 1. No scintigraphic evidence of residual or recurrent esophageal hypermetabolism. 2. No evidence of hypermetabolic metastatic disease. 3. Similar aneurysmal dilation of the infrarenal abdominal aorta measuring 4.6 cm. Recommend follow-up CT/MR every 6 months and vascular consultation. This recommendation follows ACR consensus guidelines: White Paper of the ACR Incidental Findings Committee II on Vascular Findings. J Am Coll Radiol 2013; 40:981-191. 4. Aortic Atherosclerosis (ICD10-I70.0) and Emphysema (ICD10-J43.9).  COGNITION: Overall cognitive status: Within functional limits for tasks assessed   SENSATION: WFL  COORDINATION: WFL  POSTURE: No Significant postural limitations   LOWER EXTREMITY MMT:    MMT Right Eval Left Eval 4/16 R 4/16  L   Hip flexion 4 4 4 4   Hip abduction 4 4 4+ 4+  Hip adduction 4- 4- 4+ 4+  Knee flexion 4- 4- 4 4  Knee extension 4- 4- 4 4  Ankle dorsiflexion 4- 4- 4 4  (Blank rows = not tested)  FUNCTIONAL TESTS:  5 times sit to stand: 34.89 sec 4/16: with UE support 11.44; UE support on thighs: 11.43; no UE support 10.36   Timed up and go (TUG): 27.56 sec 4/16: 15.83  6 minute walk test: 11' with use of rollator 4/16: 844ft with  rollator 10 meter walk test: 32.77 sec 4/16 13.05 sec no UE support   Berg Balance Scale: 37/56: 4/16 44/56  PATIENT SURVEYS:  FOTO 44 4/16: 46   TODAY'S TREATMENT: DATE: 10/11/22   Nustep level 2 x   Forward Foot tap on hedge hog over 5 inch hurdle x 10 BLE with BUE support and x 10BLE  with intermittent UE support .  Lateral Foot tap on hedge hog over 5 inch hurdle.  x 10 BLE with BUE support and x 10BLE  with intermittent UE support .  Foot tap on 6 inch step 3# ankle weight x 10 with UE support and x 10 with intermittent UE support.  Step up/down with x 5 BLE with UE support then x 5 BLE with 1 UE support  Sit<>stand with 10#(5# in Bil UE) 2 x 8. CGA for anterior weight shift of last 3 for both sets.   Gait with  SPC 2 x 137ft with 3# ankle weights and cues for increased step length throughout as well as decreased force of heel contact on  the LLE.      PATIENT EDUCATION: Education details:  Pt educated throughout session about proper posture and technique with exercises. Improved exercise technique, movement at target joints, use of target muscles after min to mod verbal, visual, tactile cues   Person educated: Patient and Child(ren) Education method: Explanation Education comprehension: verbalized understanding  HOME EXERCISE PROGRAM:  Access Code: [email or web address] URL: [email or web address] Date: 08/16/2022 Prepared by: Grier Rocher  Exercises - Seated Calf Stretch with Strap  - 1 x daily - 7 x weekly - 3 sets - 10 reps - Seated Calf Stretch knee bent with Strap  - 1 x daily - 7 x weekly - 3 sets - 10 reps - Standing Hip Flexor Stretch  - 1 x daily - 7 x weekly - 3 sets - 10 reps  Access Code: [email or web address] URL: [email or web address] Date: 07/14/2022 Prepared by: Tomasa Hose  Exercises - Supine Bridge  - 1 x daily - 7 x weekly - 3 sets - 10 reps - Clamshell  - 1 x daily - 7 x weekly - 3 sets - 10 reps - Sidelying Hip Abduction  - 1 x daily  - 7 x weekly - 3 sets - 10 reps - Supine Posterior Pelvic Tilt  - 1 x daily - 7 x weekly - 3 sets - 10 reps - Sidelying Reverse Clamshell  - 1 x daily - 7 x weekly - 3 sets - 10 reps - Active Straight Leg Raise with Quad Set  - 1 x daily - 7 x weekly - 3 sets - 10 reps  GOALS: Goals reviewed with patient? Yes  SHORT TERM GOALS: Target date: 11/01/2022    Pt will be independent with HEP in order to demonstrate increased ability to perform tasks related to occupation/hobbies. Baseline: Pt to be given HEP at next visit Goal status: IN PROGRESS  LONG TERM GOALS: Target date: 12/27/2022    1.  Patient (> 5 years old) will complete five times sit to stand test in < 10 seconds indicating an increased LE strength and improved balance. Baseline: 34.89 sec. 4/16 no UE support 10.36 Goal status: UPDATED  2.  Patient will increase FOTO score to equal to or greater than 54 to demonstrate statistically significant improvement in mobility and quality of life.  Baseline: FOTO: 44 4/16. 46  Goal status: IN PROGRESS   3.  Patient will increase Berg Balance score by >45 points to demonstrate decreased fall risk during functional activities. Baseline: BERG: 37  08/12/2022: 39.  4/16 44/56 Goal status: updated.    4.  Patient will reduce timed up and go to <11 seconds to reduce fall risk and demonstrate improved transfer/gait ability. Baseline: 27.56 sec 08/12/2022: 12.9 with no AD  4/16: 15.83  Goal status: IN PROGRESS  5.  Patient will increase 10 meter walk test to >1.14m/s as to improve gait speed for better community ambulation and to reduce fall risk. Baseline: 32.77 sec. 13.05 sec no UE support   Goal status: IN PROGRESS  6.  Patient will increase six minute walk test distance to >1000 for progression to community ambulator and improve gait ability Baseline: 16' with use of rollator 08/12/2022: 820'. 4/16 810ft  Goal status: IN PROGRESS   ASSESSMENT:  CLINICAL IMPRESSION:  Patient put  forth excellent effort towards all therapeutic activity on this day. PT treatment focused on dynamic strength and high level balance training in parallel bars as well as resisted gait training. Pt able to tolerate increased load demand for  BLE strengthening, but continues to utilize BUE support throughout for safety due to fear of falling.  Patient will benefit from continued strength endurance and balance training as well as continued skilled therapy to address remaining deficits in order to improve overall QoL and return to PLOF.       OBJECTIVE IMPAIRMENTS: Abnormal gait, decreased activity tolerance, decreased balance, decreased endurance, decreased knowledge of use of DME, decreased mobility, difficulty walking, and decreased strength.   ACTIVITY LIMITATIONS: carrying, lifting, standing, squatting, stairs, transfers, bed mobility, continence, bathing, toileting, dressing, and locomotion level  PARTICIPATION LIMITATIONS: cleaning, laundry, driving, shopping, community activity, yard work, and church  PERSONAL FACTORS: Age, Fitness, Past/current experiences, and Time since onset of injury/illness/exacerbation are also affecting patient's functional outcome.   REHAB POTENTIAL: Good  CLINICAL DECISION MAKING: Stable/uncomplicated  EVALUATION COMPLEXITY: Low  PLAN:  PT FREQUENCY: 2x/week  PT DURATION: 12 weeks  PLANNED INTERVENTIONS: Therapeutic exercises, Therapeutic activity, Neuromuscular re-education, Balance training, Gait training, Patient/Family education, Self Care, Joint mobilization, Stair training, Vestibular training, Canalith repositioning, DME instructions, Aquatic Therapy, Dry Needling, Cryotherapy, Moist heat, Taping, Manual therapy, and Re-evaluation   PLAN FOR NEXT SESSION:   Continue to progress BLE strengthening program; dynamic balance and gait training to facilitate improved step length on BLE.    Grier Rocher PT, DPT  Physical Therapist - [facility]  11:02 AM 10/11/22

## 2022-10-14 ENCOUNTER — Ambulatory Visit: Payer: Medicare PPO | Admitting: Physical Therapy

## 2022-10-18 ENCOUNTER — Ambulatory Visit: Payer: Medicare PPO | Attending: Internal Medicine | Admitting: Physical Therapy

## 2022-10-18 DIAGNOSIS — R262 Difficulty in walking, not elsewhere classified: Secondary | ICD-10-CM | POA: Insufficient documentation

## 2022-10-18 DIAGNOSIS — M6281 Muscle weakness (generalized): Secondary | ICD-10-CM

## 2022-10-18 DIAGNOSIS — R29898 Other symptoms and signs involving the musculoskeletal system: Secondary | ICD-10-CM

## 2022-10-18 NOTE — Therapy (Signed)
OUTPATIENT PHYSICAL THERAPY NEURO TREATMENT   Patient Name: Heather Cooper MRN: [id number] DOB:1930/09/17, 89 y.o., female Today's Date: 10/18/2022   PCP: Dale [place], MD REFERRING PROVIDER: Louretta Shorten, MD  END OF SESSION:  PT End of Session - 10/18/22 1009     Visit Number 25    Number of Visits 46    Date for PT Re-Evaluation 12/27/22    Progress Note Due on Visit 30    PT Start Time 1013    PT Stop Time 1059    PT Time Calculation (min) 46 min    Equipment Utilized During Treatment Gait belt    Activity Tolerance Patient tolerated treatment well    Behavior During Therapy WFL for tasks assessed/performed                  Past Medical History:  Diagnosis Date   Chemotherapy induced nausea and vomiting    Chicken pox    Esophageal cancer (HCC)    Hypercalcemia    familial hypocalciuric hypercalcemia   Hypercholesterolemia    Hypertension    Lung cancer (HCC)    Osteoporosis    Thyroid disease    Past Surgical History:  Procedure Laterality Date   ABDOMINAL HYSTERECTOMY     partial   CHOLECYSTECTOMY     COLONOSCOPY WITH PROPOFOL N/A 04/17/2017   Procedure: COLONOSCOPY WITH PROPOFOL;  Surgeon: Scot Jun, MD;  Location: [facility] ENDOSCOPY;  Service: Endoscopy;  Laterality: N/A;   ESOPHAGOGASTRODUODENOSCOPY (EGD) WITH PROPOFOL N/A 04/13/2021   Procedure: ESOPHAGOGASTRODUODENOSCOPY (EGD) WITH PROPOFOL;  Surgeon: Midge Minium, MD;  Location: [facility] ENDOSCOPY;  Service: Endoscopy;  Laterality: N/A;   EUS N/A 04/22/2021   Procedure: FULL UPPER ENDOSCOPIC ULTRASOUND (EUS) RADIAL;  Surgeon: Rayann Heman, MD;  Location: [facility] ENDOSCOPY;  Service: Endoscopy;  Laterality: N/A;  [facility] needed   EUS N/A 10/28/2021   Procedure: UPPER ENDOSCOPIC ULTRASOUND (EUS) LINEAR;  Surgeon: Doren Custard, MD;  Location: [facility] ENDOSCOPY;  Service: Gastroenterology;  Laterality: N/A;  [facility]   transvaginal hysterectomy  04/18/05   with anterior colporrhaphy    Patient Active Problem List   Diagnosis Date Noted   Encounter for immunotherapy 04/26/2022   Low TSH level 11/23/2021   PAD (peripheral artery disease) (HCC) 08/28/2021   Emphysema of lung (HCC) 08/28/2021   Esophageal cancer (HCC) 05/30/2021   Diarrhea 05/30/2021   Neoplasm of middle third of esophagus 05/10/2021   Stricture and stenosis of esophagus    Dysphagia 01/22/2021   Type 2 diabetes mellitus with hyperglycemia (HCC) 01/19/2021   COVID-19 virus infection 06/21/2020   Anemia 05/11/2020   Malignant neoplasm of right upper lobe of lung (HCC) 08/07/2019   URI (upper respiratory infection) 09/16/2016   Health care maintenance 05/18/2016   History of lung cancer 02/08/2016   Abdominal aortic aneurysm (HCC) 08/20/2015   Abnormal CXR 03/06/2014   Cough 03/06/2014   Headache(784.0) 03/06/2014   Hyperglycemia 02/07/2013   Hypertension 06/26/2012   Hypercholesterolemia 06/26/2012   Osteoporosis 06/26/2012   Hypercalcemia 06/26/2012   GERD (gastroesophageal reflux disease) 06/26/2012    ONSET DATE: 9 months, 10/2021  REFERRING DIAG: M62.81 (ICD-10-CM) - Muscle weakness (generalized)   THERAPY DIAG:  Muscle weakness (generalized)  Difficulty in walking, not elsewhere classified  Weakness of both hips  Rationale for Evaluation and Treatment: Rehabilitation  SUBJECTIVE:  SUBJECTIVE STATEMENT:  Patient reports no new issues. Had a good weekend. Was able to see son over the weekend who had knee surgery. Due to weather reports that she did not get out of the house much since last PT treatment.    Pt accompanied by: self,  PERTINENT HISTORY:  Pt developed has history of cancer x2, but is in remission.  Pt has been on prednisone for 3 months, but should be off it in the next few days.  MD  feels as though that has contributed to her weakness.  Pt has caregiver that comes over 3x week that helps washing clothes due to having some things on the ground floor.  PAIN:  Are you having pain? No  PRECAUTIONS: Other: Cancer  WEIGHT BEARING RESTRICTIONS: No  FALLS: Has patient fallen in last 6 months? No  LIVING ENVIRONMENT: Lives with: lives alone Lives in: House/apartment Stairs: Yes: Internal: 13 steps; can reach both Has following equipment at home: Single point cane, Walker - 4 wheeled, shower chair, and Grab bars  PLOF: Independent and needing some assistance with driving.  PATIENT GOALS: To get stronger and stop utilizing the walker as much.  OBJECTIVE:   DIAGNOSTIC FINDINGS:   EXAM: NUCLEAR MEDICINE PET SKULL BASE TO THIGH  IMPRESSION: 1. No scintigraphic evidence of residual or recurrent esophageal hypermetabolism. 2. No evidence of hypermetabolic metastatic disease. 3. Similar aneurysmal dilation of the infrarenal abdominal aorta measuring 4.6 cm. Recommend follow-up CT/MR every 6 months and vascular consultation. This recommendation follows ACR consensus guidelines: White Paper of the ACR Incidental Findings Committee II on Vascular Findings. J Am Coll Radiol 2013; 16:109-604. 4. Aortic Atherosclerosis (ICD10-I70.0) and Emphysema (ICD10-J43.9).  COGNITION: Overall cognitive status: Within functional limits for tasks assessed   SENSATION: WFL  COORDINATION: WFL  POSTURE: No Significant postural limitations   LOWER EXTREMITY MMT:    MMT Right Eval Left Eval 4/16 R 4/16  L   Hip flexion 4 4 4 4   Hip abduction 4 4 4+ 4+  Hip adduction 4- 4- 4+ 4+  Knee flexion 4- 4- 4 4  Knee extension 4- 4- 4 4  Ankle dorsiflexion 4- 4- 4 4  (Blank rows = not tested)  FUNCTIONAL TESTS:  5 times sit to stand: 34.89 sec 4/16: with UE support 11.44; UE support on thighs: 11.43; no UE support 10.36   Timed up and go (TUG): 27.56 sec 4/16: 15.83  6 minute  walk test: 63' with use of rollator 4/16: 821ft with rollator 10 meter walk test: 32.77 sec 4/16 13.05 sec no UE support   Berg Balance Scale: 37/56: 4/16 44/56  PATIENT SURVEYS:  FOTO 44 4/16: 46   TODAY'S TREATMENT: DATE: 10/18/22   Nustep level 3 x 4 min.  level 1 x 1 min cool down. Cues to keep SPM >50 throughout varied resistance and for full step length on BLE.   Leg press.  25# 2 x 10  32.5# x 8   Gait with SPC x 165ft, supervision assist. Pt then performed weighted gait training with 4# ankle weights 3 x 13ft with SPC.   Seated therex:  Hip abduction RTB. 2 x 12 BLE  Hip extension 2 x 12 BLE LAQ RTB x 12 BLE   Supervision assist provided for all weighted gait exercises with min cues from PT for decreased force of heel contact. Noted to have improved weight shift and step length on the BLE with instruction for proper heel contact.    PATIENT EDUCATION:  Education details:  Pt educated throughout session about proper posture and technique with exercises. Improved exercise technique, movement at target joints, use of target muscles after min to mod verbal, visual, tactile cues   Person educated: Patient and Child(ren) Education method: Explanation Education comprehension: verbalized understanding  HOME EXERCISE PROGRAM:  Access Code: [email or web address] URL: [email or web address] Date: 08/16/2022 Prepared by: Grier Rocher  Exercises - Seated Calf Stretch with Strap  - 1 x daily - 7 x weekly - 3 sets - 10 reps - Seated Calf Stretch knee bent with Strap  - 1 x daily - 7 x weekly - 3 sets - 10 reps - Standing Hip Flexor Stretch  - 1 x daily - 7 x weekly - 3 sets - 10 reps  Access Code: [email or web address] URL: [email or web address] Date: 07/14/2022 Prepared by: Tomasa Hose  Exercises - Supine Bridge  - 1 x daily - 7 x weekly - 3 sets - 10 reps - Clamshell  - 1 x daily - 7 x weekly - 3 sets - 10 reps - Sidelying Hip Abduction  - 1 x daily - 7 x weekly -  3 sets - 10 reps - Supine Posterior Pelvic Tilt  - 1 x daily - 7 x weekly - 3 sets - 10 reps - Sidelying Reverse Clamshell  - 1 x daily - 7 x weekly - 3 sets - 10 reps - Active Straight Leg Raise with Quad Set  - 1 x daily - 7 x weekly - 3 sets - 10 reps  GOALS: Goals reviewed with patient? Yes  SHORT TERM GOALS: Target date: 11/01/2022    Pt will be independent with HEP in order to demonstrate increased ability to perform tasks related to occupation/hobbies. Baseline: Pt to be given HEP at next visit Goal status: IN PROGRESS  LONG TERM GOALS: Target date: 12/27/2022    1.  Patient (> 50 years old) will complete five times sit to stand test in < 10 seconds indicating an increased LE strength and improved balance. Baseline: 34.89 sec. 4/16 no UE support 10.36 Goal status: UPDATED  2.  Patient will increase FOTO score to equal to or greater than 54 to demonstrate statistically significant improvement in mobility and quality of life.  Baseline: FOTO: 44 4/16. 46  Goal status: IN PROGRESS   3.  Patient will increase Berg Balance score by >45 points to demonstrate decreased fall risk during functional activities. Baseline: BERG: 37  08/12/2022: 39.  4/16 44/56 Goal status: updated.    4.  Patient will reduce timed up and go to <11 seconds to reduce fall risk and demonstrate improved transfer/gait ability. Baseline: 27.56 sec 08/12/2022: 12.9 with no AD  4/16: 15.83  Goal status: IN PROGRESS  5.  Patient will increase 10 meter walk test to >1.12m/s as to improve gait speed for better community ambulation and to reduce fall risk. Baseline: 32.77 sec. 13.05 sec no UE support   Goal status: IN PROGRESS  6.  Patient will increase six minute walk test distance to >1000 for progression to community ambulator and improve gait ability Baseline: 47' with use of rollator 08/12/2022: 820'. 4/16 867ft  Goal status: IN PROGRESS   ASSESSMENT:  CLINICAL IMPRESSION:  Patient put forth excellent  effort towards all therapeutic activity on this day. PT treatment focused on functional strength training on this day. Pt noted to have PR on leg press completing 35# for 8 reps, but instruction required to eccentric control. Noted to have significantly improved step length and  decreased foot slap on the LLE.  Patient will benefit from continued strength endurance and balance training as well as continued skilled therapy to address remaining deficits in order to improve overall QoL and return to PLOF.       OBJECTIVE IMPAIRMENTS: Abnormal gait, decreased activity tolerance, decreased balance, decreased endurance, decreased knowledge of use of DME, decreased mobility, difficulty walking, and decreased strength.   ACTIVITY LIMITATIONS: carrying, lifting, standing, squatting, stairs, transfers, bed mobility, continence, bathing, toileting, dressing, and locomotion level  PARTICIPATION LIMITATIONS: cleaning, laundry, driving, shopping, community activity, yard work, and church  PERSONAL FACTORS: Age, Fitness, Past/current experiences, and Time since onset of injury/illness/exacerbation are also affecting patient's functional outcome.   REHAB POTENTIAL: Good  CLINICAL DECISION MAKING: Stable/uncomplicated  EVALUATION COMPLEXITY: Low  PLAN:  PT FREQUENCY: 2x/week  PT DURATION: 12 weeks  PLANNED INTERVENTIONS: Therapeutic exercises, Therapeutic activity, Neuromuscular re-education, Balance training, Gait training, Patient/Family education, Self Care, Joint mobilization, Stair training, Vestibular training, Canalith repositioning, DME instructions, Aquatic Therapy, Dry Needling, Cryotherapy, Moist heat, Taping, Manual therapy, and Re-evaluation   PLAN FOR NEXT SESSION:   Continue to progress BLE strengthening program; dynamic balance and gait training to facilitate improved step length on BLE.    Grier Rocher PT, DPT  Physical Therapist - [facility]   11:40 AM 10/18/22

## 2022-10-20 ENCOUNTER — Ambulatory Visit: Payer: Medicare PPO | Admitting: Physical Therapy

## 2022-10-20 DIAGNOSIS — R262 Difficulty in walking, not elsewhere classified: Secondary | ICD-10-CM

## 2022-10-20 DIAGNOSIS — R29898 Other symptoms and signs involving the musculoskeletal system: Secondary | ICD-10-CM

## 2022-10-20 DIAGNOSIS — M6281 Muscle weakness (generalized): Secondary | ICD-10-CM

## 2022-10-20 DIAGNOSIS — H903 Sensorineural hearing loss, bilateral: Secondary | ICD-10-CM | POA: Diagnosis not present

## 2022-10-20 NOTE — Therapy (Signed)
OUTPATIENT PHYSICAL THERAPY NEURO TREATMENT   Patient Name: Heather Cooper MRN: [id number] DOB:1931-01-04, 89 y.o., female Today's Date: 10/20/2022   PCP: Dale Egeland, MD REFERRING PROVIDER: Louretta Shorten, MD  END OF SESSION:  PT End of Session - 10/20/22 1109     Visit Number 26    Number of Visits 46    Date for PT Re-Evaluation 12/27/22    Progress Note Due on Visit 30    PT Start Time 1102    PT Stop Time 1145    PT Time Calculation (min) 43 min    Equipment Utilized During Treatment Gait belt    Activity Tolerance Patient tolerated treatment well    Behavior During Therapy WFL for tasks assessed/performed                  Past Medical History:  Diagnosis Date   Chemotherapy induced nausea and vomiting    Chicken pox    Esophageal cancer (HCC)    Hypercalcemia    familial hypocalciuric hypercalcemia   Hypercholesterolemia    Hypertension    Lung cancer (HCC)    Osteoporosis    Thyroid disease    Past Surgical History:  Procedure Laterality Date   ABDOMINAL HYSTERECTOMY     partial   CHOLECYSTECTOMY     COLONOSCOPY WITH PROPOFOL N/A 04/17/2017   Procedure: COLONOSCOPY WITH PROPOFOL;  Surgeon: Scot Jun, MD;  Location: [facility] ENDOSCOPY;  Service: Endoscopy;  Laterality: N/A;   ESOPHAGOGASTRODUODENOSCOPY (EGD) WITH PROPOFOL N/A 04/13/2021   Procedure: ESOPHAGOGASTRODUODENOSCOPY (EGD) WITH PROPOFOL;  Surgeon: Midge Minium, MD;  Location: [facility] ENDOSCOPY;  Service: Endoscopy;  Laterality: N/A;   EUS N/A 04/22/2021   Procedure: FULL UPPER ENDOSCOPIC ULTRASOUND (EUS) RADIAL;  Surgeon: Rayann Heman, MD;  Location: [facility] ENDOSCOPY;  Service: Endoscopy;  Laterality: N/A;  [facility] needed   EUS N/A 10/28/2021   Procedure: UPPER ENDOSCOPIC ULTRASOUND (EUS) LINEAR;  Surgeon: Doren Custard, MD;  Location: [facility] ENDOSCOPY;  Service: Gastroenterology;  Laterality: N/A;  [facility]   transvaginal hysterectomy  04/18/05   with anterior colporrhaphy    Patient Active Problem List   Diagnosis Date Noted   Encounter for immunotherapy 04/26/2022   Low TSH level 11/23/2021   PAD (peripheral artery disease) (HCC) 08/28/2021   Emphysema of lung (HCC) 08/28/2021   Esophageal cancer (HCC) 05/30/2021   Diarrhea 05/30/2021   Neoplasm of middle third of esophagus 05/10/2021   Stricture and stenosis of esophagus    Dysphagia 01/22/2021   Type 2 diabetes mellitus with hyperglycemia (HCC) 01/19/2021   COVID-19 virus infection 06/21/2020   Anemia 05/11/2020   Malignant neoplasm of right upper lobe of lung (HCC) 08/07/2019   URI (upper respiratory infection) 09/16/2016   Health care maintenance 05/18/2016   History of lung cancer 02/08/2016   Abdominal aortic aneurysm (HCC) 08/20/2015   Abnormal CXR 03/06/2014   Cough 03/06/2014   Headache(784.0) 03/06/2014   Hyperglycemia 02/07/2013   Hypertension 06/26/2012   Hypercholesterolemia 06/26/2012   Osteoporosis 06/26/2012   Hypercalcemia 06/26/2012   GERD (gastroesophageal reflux disease) 06/26/2012    ONSET DATE: 9 months, 10/2021  REFERRING DIAG: M62.81 (ICD-10-CM) - Muscle weakness (generalized)   THERAPY DIAG:  Muscle weakness (generalized)  Difficulty in walking, not elsewhere classified  Weakness of both hips  Rationale for Evaluation and Treatment: Rehabilitation  SUBJECTIVE:  SUBJECTIVE STATEMENT:  Patient reports no new issues. Plans to go to church and out to eat over the weekend, hoping to use [facility] for community access soone instead of rollator.    Pt accompanied by: self,  PERTINENT HISTORY:  Pt developed has history of cancer x2, but is in remission.  Pt has been on prednisone for 3 months, but should be off it in the next few days.  MD feels as though that has contributed to her  weakness.  Pt has caregiver that comes over 3x week that helps washing clothes due to having some things on the ground floor.  PAIN:  Are you having pain? No  PRECAUTIONS: Other: Cancer  WEIGHT BEARING RESTRICTIONS: No  FALLS: Has patient fallen in last 6 months? No  LIVING ENVIRONMENT: Lives with: lives alone Lives in: House/apartment Stairs: Yes: Internal: 13 steps; can reach both Has following equipment at home: Single point cane, Walker - 4 wheeled, shower chair, and Grab bars  PLOF: Independent and needing some assistance with driving.  PATIENT GOALS: To get stronger and stop utilizing the walker as much.  OBJECTIVE:   DIAGNOSTIC FINDINGS:   EXAM: NUCLEAR MEDICINE PET SKULL BASE TO THIGH  IMPRESSION: 1. No scintigraphic evidence of residual or recurrent esophageal hypermetabolism. 2. No evidence of hypermetabolic metastatic disease. 3. Similar aneurysmal dilation of the infrarenal abdominal aorta measuring 4.6 cm. Recommend follow-up CT/MR every 6 months and vascular consultation. This recommendation follows ACR consensus guidelines: White Paper of the ACR Incidental Findings Committee II on Vascular Findings. J Am Coll Radiol 2013; 08:657-846. 4. Aortic Atherosclerosis (ICD10-I70.0) and Emphysema (ICD10-J43.9).  COGNITION: Overall cognitive status: Within functional limits for tasks assessed   SENSATION: WFL  COORDINATION: WFL  POSTURE: No Significant postural limitations   LOWER EXTREMITY MMT:    MMT Right Eval Left Eval 4/16 R 4/16  L   Hip flexion 4 4 4 4   Hip abduction 4 4 4+ 4+  Hip adduction 4- 4- 4+ 4+  Knee flexion 4- 4- 4 4  Knee extension 4- 4- 4 4  Ankle dorsiflexion 4- 4- 4 4  (Blank rows = not tested)  FUNCTIONAL TESTS:  5 times sit to stand: 34.89 sec 4/16: with UE support 11.44; UE support on thighs: 11.43; no UE support 10.36   Timed up and go (TUG): 27.56 sec 4/16: 15.83  6 minute walk test: 44' with use of rollator 4/16:  82ft with rollator 10 meter walk test: 32.77 sec 4/16 13.05 sec no UE support   Berg Balance Scale: 37/56: 4/16 44/56  PATIENT SURVEYS:  FOTO 44 4/16: 46   TODAY'S TREATMENT: DATE: 10/20/22   Nustep level 3 x 5 min.   Cues to keep SPM >50 throughout varied resistance and for full step length on BLE.   Gait with SPC x 328ft, supervision assist. One mild LOB to the R after 289ft, pt able to self correct with lateral stepping strategy.  Pt then performed weighted gait training with 4# ankle weights x 155ft with SPC.   Dynamic gait training performed with SPC  Tandem walk 39ft x 6 Side stepping R and L 56ft x 3 bil.  Navigate obstacle course up/down 6 inch step, up/down airex pad, over half bolster performed x 6 with CGA from PT for safety and cues for safety and adequate step length.   Sit<>stand with medicine ball 3 x 5 cues for adequate anterior weight shift to achieve full standing.       PATIENT EDUCATION:  Education details:  Pt educated throughout session about proper posture and technique with exercises. Improved exercise technique, movement at target joints, use of target muscles after min to mod verbal, visual, tactile cues   Person educated: Patient and Child(ren) Education method: Explanation Education comprehension: verbalized understanding  HOME EXERCISE PROGRAM:  Access Code: [email or web address] URL: [email or web address] Date: 08/16/2022 Prepared by: Grier Rocher  Exercises - Seated Calf Stretch with Strap  - 1 x daily - 7 x weekly - 3 sets - 10 reps - Seated Calf Stretch knee bent with Strap  - 1 x daily - 7 x weekly - 3 sets - 10 reps - Standing Hip Flexor Stretch  - 1 x daily - 7 x weekly - 3 sets - 10 reps  Access Code: [email or web address] URL: [email or web address] Date: 07/14/2022 Prepared by: Tomasa Hose  Exercises - Supine Bridge  - 1 x daily - 7 x weekly - 3 sets - 10 reps - Clamshell  - 1 x daily - 7 x weekly - 3 sets - 10 reps -  Sidelying Hip Abduction  - 1 x daily - 7 x weekly - 3 sets - 10 reps - Supine Posterior Pelvic Tilt  - 1 x daily - 7 x weekly - 3 sets - 10 reps - Sidelying Reverse Clamshell  - 1 x daily - 7 x weekly - 3 sets - 10 reps - Active Straight Leg Raise with Quad Set  - 1 x daily - 7 x weekly - 3 sets - 10 reps  GOALS: Goals reviewed with patient? Yes  SHORT TERM GOALS: Target date: 11/01/2022    Pt will be independent with HEP in order to demonstrate increased ability to perform tasks related to occupation/hobbies. Baseline: Pt to be given HEP at next visit Goal status: IN PROGRESS  LONG TERM GOALS: Target date: 12/27/2022    1.  Patient (> 69 years old) will complete five times sit to stand test in < 10 seconds indicating an increased LE strength and improved balance. Baseline: 34.89 sec. 4/16 no UE support 10.36 Goal status: UPDATED  2.  Patient will increase FOTO score to equal to or greater than 54 to demonstrate statistically significant improvement in mobility and quality of life.  Baseline: FOTO: 44 4/16. 46  Goal status: IN PROGRESS   3.  Patient will increase Berg Balance score by >45 points to demonstrate decreased fall risk during functional activities. Baseline: BERG: 37  08/12/2022: 39.  4/16 44/56 Goal status: updated.    4.  Patient will reduce timed up and go to <11 seconds to reduce fall risk and demonstrate improved transfer/gait ability. Baseline: 27.56 sec 08/12/2022: 12.9 with no AD  4/16: 15.83  Goal status: IN PROGRESS  5.  Patient will increase 10 meter walk test to >1.34m/s as to improve gait speed for better community ambulation and to reduce fall risk. Baseline: 32.77 sec. 13.05 sec no UE support   Goal status: IN PROGRESS  6.  Patient will increase six minute walk test distance to >1000 for progression to community ambulator and improve gait ability Baseline: 51' with use of rollator 08/12/2022: 820'. 4/16 896ft  Goal status: IN  PROGRESS   ASSESSMENT:  CLINICAL IMPRESSION:  Patient put forth excellent effort towards all therapeutic activity on this day. Pt demonstrating improved step length on BLE with use of SPC on this day. Noted to have increased fatigue with weighted sit<>stand on this day requiring min assist for weight shift to achieve full standing. Will benefit  from increase gluteal strengthening. Patient will benefit from continued strength endurance and balance training as well as continued skilled therapy to address remaining deficits in order to improve overall QoL and return to PLOF.       OBJECTIVE IMPAIRMENTS: Abnormal gait, decreased activity tolerance, decreased balance, decreased endurance, decreased knowledge of use of DME, decreased mobility, difficulty walking, and decreased strength.   ACTIVITY LIMITATIONS: carrying, lifting, standing, squatting, stairs, transfers, bed mobility, continence, bathing, toileting, dressing, and locomotion level  PARTICIPATION LIMITATIONS: cleaning, laundry, driving, shopping, community activity, yard work, and church  PERSONAL FACTORS: Age, Fitness, Past/current experiences, and Time since onset of injury/illness/exacerbation are also affecting patient's functional outcome.   REHAB POTENTIAL: Good  CLINICAL DECISION MAKING: Stable/uncomplicated  EVALUATION COMPLEXITY: Low  PLAN:  PT FREQUENCY: 2x/week  PT DURATION: 12 weeks  PLANNED INTERVENTIONS: Therapeutic exercises, Therapeutic activity, Neuromuscular re-education, Balance training, Gait training, Patient/Family education, Self Care, Joint mobilization, Stair training, Vestibular training, Canalith repositioning, DME instructions, Aquatic Therapy, Dry Needling, Cryotherapy, Moist heat, Taping, Manual therapy, and Re-evaluation   PLAN FOR NEXT SESSION:   Continue to progress BLE strengthening program;  dynamic balance and gait training to facilitate improved step length on BLE.    Grier Rocher  PT, DPT  Physical Therapist - [facility]  12:08 PM 10/20/22

## 2022-10-25 ENCOUNTER — Ambulatory Visit: Payer: Medicare PPO | Admitting: Physical Therapy

## 2022-10-27 ENCOUNTER — Ambulatory Visit: Payer: Medicare PPO | Admitting: Physical Therapy

## 2022-10-27 DIAGNOSIS — M6281 Muscle weakness (generalized): Secondary | ICD-10-CM | POA: Diagnosis not present

## 2022-10-27 DIAGNOSIS — R29898 Other symptoms and signs involving the musculoskeletal system: Secondary | ICD-10-CM | POA: Diagnosis not present

## 2022-10-27 DIAGNOSIS — R262 Difficulty in walking, not elsewhere classified: Secondary | ICD-10-CM | POA: Diagnosis not present

## 2022-10-27 NOTE — Therapy (Signed)
OUTPATIENT PHYSICAL THERAPY NEURO TREATMENT   Patient Name: Heather Cooper MRN: [id number] DOB:March 26, 1931, 89 y.o., female Today's Date: 10/27/2022   PCP: Dale Alma, MD REFERRING PROVIDER: Louretta Shorten, MD  END OF SESSION:  PT End of Session - 10/27/22 0953     Visit Number 27    Number of Visits 46    Date for PT Re-Evaluation 12/27/22    Progress Note Due on Visit 30    PT Start Time 0932    PT Stop Time 1015    PT Time Calculation (min) 43 min    Equipment Utilized During Treatment Gait belt    Activity Tolerance Patient tolerated treatment well    Behavior During Therapy WFL for tasks assessed/performed                  Past Medical History:  Diagnosis Date   Chemotherapy induced nausea and vomiting    Chicken pox    Esophageal cancer (HCC)    Hypercalcemia    familial hypocalciuric hypercalcemia   Hypercholesterolemia    Hypertension    Lung cancer (HCC)    Osteoporosis    Thyroid disease    Past Surgical History:  Procedure Laterality Date   ABDOMINAL HYSTERECTOMY     partial   CHOLECYSTECTOMY     COLONOSCOPY WITH PROPOFOL N/A 04/17/2017   Procedure: COLONOSCOPY WITH PROPOFOL;  Surgeon: Scot Jun, MD;  Location: [facility] ENDOSCOPY;  Service: Endoscopy;  Laterality: N/A;   ESOPHAGOGASTRODUODENOSCOPY (EGD) WITH PROPOFOL N/A 04/13/2021   Procedure: ESOPHAGOGASTRODUODENOSCOPY (EGD) WITH PROPOFOL;  Surgeon: Midge Minium, MD;  Location: [facility] ENDOSCOPY;  Service: Endoscopy;  Laterality: N/A;   EUS N/A 04/22/2021   Procedure: FULL UPPER ENDOSCOPIC ULTRASOUND (EUS) RADIAL;  Surgeon: Rayann Heman, MD;  Location: [facility] ENDOSCOPY;  Service: Endoscopy;  Laterality: N/A;  [facility] needed   EUS N/A 10/28/2021   Procedure: UPPER ENDOSCOPIC ULTRASOUND (EUS) LINEAR;  Surgeon: Doren Custard, MD;  Location: [facility] ENDOSCOPY;  Service: Gastroenterology;  Laterality: N/A;  [facility]   transvaginal hysterectomy  04/18/05   with anterior colporrhaphy    Patient Active Problem List   Diagnosis Date Noted   Encounter for immunotherapy 04/26/2022   Low TSH level 11/23/2021   PAD (peripheral artery disease) (HCC) 08/28/2021   Emphysema of lung (HCC) 08/28/2021   Esophageal cancer (HCC) 05/30/2021   Diarrhea 05/30/2021   Neoplasm of middle third of esophagus 05/10/2021   Stricture and stenosis of esophagus    Dysphagia 01/22/2021   Type 2 diabetes mellitus with hyperglycemia (HCC) 01/19/2021   COVID-19 virus infection 06/21/2020   Anemia 05/11/2020   Malignant neoplasm of right upper lobe of lung (HCC) 08/07/2019   URI (upper respiratory infection) 09/16/2016   Health care maintenance 05/18/2016   History of lung cancer 02/08/2016   Abdominal aortic aneurysm (HCC) 08/20/2015   Abnormal CXR 03/06/2014   Cough 03/06/2014   Headache(784.0) 03/06/2014   Hyperglycemia 02/07/2013   Hypertension 06/26/2012   Hypercholesterolemia 06/26/2012   Osteoporosis 06/26/2012   Hypercalcemia 06/26/2012   GERD (gastroesophageal reflux disease) 06/26/2012    ONSET DATE: 9 months, 10/2021  REFERRING DIAG: M62.81 (ICD-10-CM) - Muscle weakness (generalized)   THERAPY DIAG:  Muscle weakness (generalized)  Difficulty in walking, not elsewhere classified  Weakness of both hips  Rationale for Evaluation and Treatment: Rehabilitation  SUBJECTIVE:  SUBJECTIVE STATEMENT:  Patient reports no new issues. Missed last session due to weather. Son had surgery yesterday, and pt will be visiting him today.    Pt accompanied by: self,  PERTINENT HISTORY:  Pt developed has history of cancer x2, but is in remission.  Pt has been on prednisone for 3 months, but should be off it in the next few days.  MD feels as though that has contributed to her weakness.  Pt has caregiver  that comes over 3x week that helps washing clothes due to having some things on the ground floor.  PAIN:  Are you having pain? No  PRECAUTIONS: Other: Cancer  WEIGHT BEARING RESTRICTIONS: No  FALLS: Has patient fallen in last 6 months? No  LIVING ENVIRONMENT: Lives with: lives alone Lives in: House/apartment Stairs: Yes: Internal: 13 steps; can reach both Has following equipment at home: Single point cane, Walker - 4 wheeled, shower chair, and Grab bars  PLOF: Independent and needing some assistance with driving.  PATIENT GOALS: To get stronger and stop utilizing the walker as much.  OBJECTIVE:   DIAGNOSTIC FINDINGS:   EXAM: NUCLEAR MEDICINE PET SKULL BASE TO THIGH  IMPRESSION: 1. No scintigraphic evidence of residual or recurrent esophageal hypermetabolism. 2. No evidence of hypermetabolic metastatic disease. 3. Similar aneurysmal dilation of the infrarenal abdominal aorta measuring 4.6 cm. Recommend follow-up CT/MR every 6 months and vascular consultation. This recommendation follows ACR consensus guidelines: White Paper of the ACR Incidental Findings Committee II on Vascular Findings. J Am Coll Radiol 2013; 16:109-604. 4. Aortic Atherosclerosis (ICD10-I70.0) and Emphysema (ICD10-J43.9).  COGNITION: Overall cognitive status: Within functional limits for tasks assessed   SENSATION: WFL  COORDINATION: WFL  POSTURE: No Significant postural limitations   LOWER EXTREMITY MMT:    MMT Right Eval Left Eval 4/16 R 4/16  L   Hip flexion 4 4 4 4   Hip abduction 4 4 4+ 4+  Hip adduction 4- 4- 4+ 4+  Knee flexion 4- 4- 4 4  Knee extension 4- 4- 4 4  Ankle dorsiflexion 4- 4- 4 4  (Blank rows = not tested)  FUNCTIONAL TESTS:  5 times sit to stand: 34.89 sec 4/16: with UE support 11.44; UE support on thighs: 11.43; no UE support 10.36   Timed up and go (TUG): 27.56 sec 4/16: 15.83  6 minute walk test: 71' with use of rollator 4/16: 840ft with rollator 10 meter  walk test: 32.77 sec 4/16 13.05 sec no UE support   Berg Balance Scale: 37/56: 4/16 44/56  PATIENT SURVEYS:  FOTO 44 4/16: 46   TODAY'S TREATMENT: DATE: 10/27/22   Nustep level 3 x 4 min level 2 x 1 min.   Cues to keep SPM >50 throughout varied resistance and for full step length on BLE.   Seated therex with 3# ankle weights:  LAQ x 12 bil Isometric Hip adduction  x 12 bil  Hip abduction x 12 bil  Hip flexion x 12  Standing Therex Alternating heal/toe raise x 15 Sit<>stand from chair holding ball 2 x 10 min assist for anterior weight shift.   Semitandem 2 x 30 sec  SLS with ipsilateral UE support on rollator 2 x 10   Resisted gait with 3#ankle weights x 149ft with SPC and x 177ft with rollator. Cues for increased step length on the LLE as well as improved core activation to reduce force of heel contact on the LLE.   PATIENT EDUCATION: Education details:  Pt educated throughout session about proper  posture and technique with exercises. Improved exercise technique, movement at target joints, use of target muscles after min to mod verbal, visual, tactile cues   Person educated: Patient and Child(ren) Education method: Explanation Education comprehension: verbalized understanding  HOME EXERCISE PROGRAM:  Access Code: [email or web address] URL: [email or web address] Date: 08/16/2022 Prepared by: Grier Rocher  Exercises - Seated Calf Stretch with Strap  - 1 x daily - 7 x weekly - 3 sets - 10 reps - Seated Calf Stretch knee bent with Strap  - 1 x daily - 7 x weekly - 3 sets - 10 reps - Standing Hip Flexor Stretch  - 1 x daily - 7 x weekly - 3 sets - 10 reps  Access Code: [email or web address] URL: [email or web address] Date: 07/14/2022 Prepared by: Tomasa Hose  Exercises - Supine Bridge  - 1 x daily - 7 x weekly - 3 sets - 10 reps - Clamshell  - 1 x daily - 7 x weekly - 3 sets - 10 reps - Sidelying Hip Abduction  - 1 x daily - 7 x weekly - 3 sets - 10 reps - Supine  Posterior Pelvic Tilt  - 1 x daily - 7 x weekly - 3 sets - 10 reps - Sidelying Reverse Clamshell  - 1 x daily - 7 x weekly - 3 sets - 10 reps - Active Straight Leg Raise with Quad Set  - 1 x daily - 7 x weekly - 3 sets - 10 reps  GOALS: Goals reviewed with patient? Yes  SHORT TERM GOALS: Target date: 11/01/2022    Pt will be independent with HEP in order to demonstrate increased ability to perform tasks related to occupation/hobbies. Baseline: Pt to be given HEP at next visit Goal status: IN PROGRESS  LONG TERM GOALS: Target date: 12/27/2022    1.  Patient (> 87 years old) will complete five times sit to stand test in < 10 seconds indicating an increased LE strength and improved balance. Baseline: 34.89 sec. 4/16 no UE support 10.36 Goal status: UPDATED  2.  Patient will increase FOTO score to equal to or greater than 54 to demonstrate statistically significant improvement in mobility and quality of life.  Baseline: FOTO: 44 4/16. 46  Goal status: IN PROGRESS   3.  Patient will increase Berg Balance score by >45 points to demonstrate decreased fall risk during functional activities. Baseline: BERG: 37  08/12/2022: 39.  4/16 44/56 Goal status: updated.    4.  Patient will reduce timed up and go to <11 seconds to reduce fall risk and demonstrate improved transfer/gait ability. Baseline: 27.56 sec 08/12/2022: 12.9 with no AD  4/16: 15.83  Goal status: IN PROGRESS  5.  Patient will increase 10 meter walk test to >1.65m/s as to improve gait speed for better community ambulation and to reduce fall risk. Baseline: 32.77 sec. 13.05 sec no UE support   Goal status: IN PROGRESS  6.  Patient will increase six minute walk test distance to >1000 for progression to community ambulator and improve gait ability Baseline: 98' with use of rollator 08/12/2022: 820'. 4/16 877ft  Goal status: IN PROGRESS   ASSESSMENT:  CLINICAL IMPRESSION:  Patient put forth excellent effort towards all  therapeutic activity on this day. Pt performed all therex on this day with decreased fatigue compared to prior session , but noted to have mild difficulty maintaining step through gait pattern on the LLE on this day. Patient will benefit from continued strength endurance and balance training as well as continued  skilled therapy to address remaining deficits in order to improve overall QoL and return to PLOF.       OBJECTIVE IMPAIRMENTS: Abnormal gait, decreased activity tolerance, decreased balance, decreased endurance, decreased knowledge of use of DME, decreased mobility, difficulty walking, and decreased strength.   ACTIVITY LIMITATIONS: carrying, lifting, standing, squatting, stairs, transfers, bed mobility, continence, bathing, toileting, dressing, and locomotion level  PARTICIPATION LIMITATIONS: cleaning, laundry, driving, shopping, community activity, yard work, and church  PERSONAL FACTORS: Age, Fitness, Past/current experiences, and Time since onset of injury/illness/exacerbation are also affecting patient's functional outcome.   REHAB POTENTIAL: Good  CLINICAL DECISION MAKING: Stable/uncomplicated  EVALUATION COMPLEXITY: Low  PLAN:  PT FREQUENCY: 2x/week  PT DURATION: 12 weeks  PLANNED INTERVENTIONS: Therapeutic exercises, Therapeutic activity, Neuromuscular re-education, Balance training, Gait training, Patient/Family education, Self Care, Joint mobilization, Stair training, Vestibular training, Canalith repositioning, DME instructions, Aquatic Therapy, Dry Needling, Cryotherapy, Moist heat, Taping, Manual therapy, and Re-evaluation   PLAN FOR NEXT SESSION:   Continue to progress BLE strengthening program;  dynamic balance and gait training to facilitate improved step length on BLE.    Grier Rocher PT, DPT  Physical Therapist - [facility]  10:20 AM 10/27/22

## 2022-11-01 ENCOUNTER — Ambulatory Visit: Payer: Medicare PPO | Admitting: Physical Therapy

## 2022-11-01 DIAGNOSIS — M6281 Muscle weakness (generalized): Secondary | ICD-10-CM | POA: Diagnosis not present

## 2022-11-01 DIAGNOSIS — R262 Difficulty in walking, not elsewhere classified: Secondary | ICD-10-CM | POA: Diagnosis not present

## 2022-11-01 DIAGNOSIS — R29898 Other symptoms and signs involving the musculoskeletal system: Secondary | ICD-10-CM | POA: Diagnosis not present

## 2022-11-01 NOTE — Therapy (Signed)
OUTPATIENT PHYSICAL THERAPY NEURO TREATMENT   Patient Name: Heather Cooper MRN: [id number] DOB:11-Dec-1930, 89 y.o., female Today's Date: 11/01/2022   PCP: Dale [place], MD REFERRING PROVIDER: Louretta Shorten, MD  END OF SESSION:  PT End of Session - 11/01/22 0848     Visit Number 28    Number of Visits 46    Date for PT Re-Evaluation 12/27/22    Progress Note Due on Visit 30    PT Start Time 0848    PT Stop Time 0927    PT Time Calculation (min) 39 min    Equipment Utilized During Treatment Gait belt    Activity Tolerance Patient tolerated treatment well    Behavior During Therapy WFL for tasks assessed/performed                  Past Medical History:  Diagnosis Date   Chemotherapy induced nausea and vomiting    Chicken pox    Esophageal cancer (HCC)    Hypercalcemia    familial hypocalciuric hypercalcemia   Hypercholesterolemia    Hypertension    Lung cancer (HCC)    Osteoporosis    Thyroid disease    Past Surgical History:  Procedure Laterality Date   ABDOMINAL HYSTERECTOMY     partial   CHOLECYSTECTOMY     COLONOSCOPY WITH PROPOFOL N/A 04/17/2017   Procedure: COLONOSCOPY WITH PROPOFOL;  Surgeon: Scot Jun, MD;  Location: [facility] ENDOSCOPY;  Service: Endoscopy;  Laterality: N/A;   ESOPHAGOGASTRODUODENOSCOPY (EGD) WITH PROPOFOL N/A 04/13/2021   Procedure: ESOPHAGOGASTRODUODENOSCOPY (EGD) WITH PROPOFOL;  Surgeon: Midge Minium, MD;  Location: [facility] ENDOSCOPY;  Service: Endoscopy;  Laterality: N/A;   EUS N/A 04/22/2021   Procedure: FULL UPPER ENDOSCOPIC ULTRASOUND (EUS) RADIAL;  Surgeon: Rayann Heman, MD;  Location: [facility] ENDOSCOPY;  Service: Endoscopy;  Laterality: N/A;  [facility] needed   EUS N/A 10/28/2021   Procedure: UPPER ENDOSCOPIC ULTRASOUND (EUS) LINEAR;  Surgeon: Doren Custard, MD;  Location: [facility] ENDOSCOPY;  Service: Gastroenterology;  Laterality: N/A;  [facility]   transvaginal hysterectomy  04/18/05   with anterior colporrhaphy    Patient Active Problem List   Diagnosis Date Noted   Encounter for immunotherapy 04/26/2022   Low TSH level 11/23/2021   PAD (peripheral artery disease) (HCC) 08/28/2021   Emphysema of lung (HCC) 08/28/2021   Esophageal cancer (HCC) 05/30/2021   Diarrhea 05/30/2021   Neoplasm of middle third of esophagus 05/10/2021   Stricture and stenosis of esophagus    Dysphagia 01/22/2021   Type 2 diabetes mellitus with hyperglycemia (HCC) 01/19/2021   COVID-19 virus infection 06/21/2020   Anemia 05/11/2020   Malignant neoplasm of right upper lobe of lung (HCC) 08/07/2019   URI (upper respiratory infection) 09/16/2016   Health care maintenance 05/18/2016   History of lung cancer 02/08/2016   Abdominal aortic aneurysm (HCC) 08/20/2015   Abnormal CXR 03/06/2014   Cough 03/06/2014   Headache(784.0) 03/06/2014   Hyperglycemia 02/07/2013   Hypertension 06/26/2012   Hypercholesterolemia 06/26/2012   Osteoporosis 06/26/2012   Hypercalcemia 06/26/2012   GERD (gastroesophageal reflux disease) 06/26/2012    ONSET DATE: 9 months, 10/2021  REFERRING DIAG: M62.81 (ICD-10-CM) - Muscle weakness (generalized)   THERAPY DIAG:  Muscle weakness (generalized)  Weakness of both hips  Difficulty in walking, not elsewhere classified  Rationale for Evaluation and Treatment: Rehabilitation  SUBJECTIVE:  SUBJECTIVE STATEMENT:  Patient reports that she is doing well. Pt apologizes for being slightly late. States that there was traffic from an accident on there way to the clinic.  Will miss next scheduled therapy session. Pt's family is going out of town for [employer] day.    Pt accompanied by: self,  PERTINENT HISTORY:  Pt developed has history of cancer x2, but is in remission.  Pt has been on prednisone for 3 months,  but should be off it in the next few days.  MD feels as though that has contributed to her weakness.  Pt has caregiver that comes over 3x week that helps washing clothes due to having some things on the ground floor.  PAIN:  Are you having pain? No  PRECAUTIONS: Other: Cancer  WEIGHT BEARING RESTRICTIONS: No  FALLS: Has patient fallen in last 6 months? No  LIVING ENVIRONMENT: Lives with: lives alone Lives in: House/apartment Stairs: Yes: Internal: 13 steps; can reach both Has following equipment at home: Single point cane, Walker - 4 wheeled, shower chair, and Grab bars  PLOF: Independent and needing some assistance with driving.  PATIENT GOALS: To get stronger and stop utilizing the walker as much.  OBJECTIVE:   DIAGNOSTIC FINDINGS:   EXAM: NUCLEAR MEDICINE PET SKULL BASE TO THIGH  IMPRESSION: 1. No scintigraphic evidence of residual or recurrent esophageal hypermetabolism. 2. No evidence of hypermetabolic metastatic disease. 3. Similar aneurysmal dilation of the infrarenal abdominal aorta measuring 4.6 cm. Recommend follow-up CT/MR every 6 months and vascular consultation. This recommendation follows ACR consensus guidelines: White Paper of the ACR Incidental Findings Committee II on Vascular Findings. J Am Coll Radiol 2013; 16:109-604. 4. Aortic Atherosclerosis (ICD10-I70.0) and Emphysema (ICD10-J43.9).  COGNITION: Overall cognitive status: Within functional limits for tasks assessed   SENSATION: WFL  COORDINATION: WFL  POSTURE: No Significant postural limitations   LOWER EXTREMITY MMT:    MMT Right Eval Left Eval 4/16 R 4/16  L   Hip flexion 4 4 4 4   Hip abduction 4 4 4+ 4+  Hip adduction 4- 4- 4+ 4+  Knee flexion 4- 4- 4 4  Knee extension 4- 4- 4 4  Ankle dorsiflexion 4- 4- 4 4  (Blank rows = not tested)  FUNCTIONAL TESTS:  5 times sit to stand: 34.89 sec 4/16: with UE support 11.44; UE support on thighs: 11.43; no UE support 10.36   Timed up  and go (TUG): 27.56 sec 4/16: 15.83  6 minute walk test: 26' with use of rollator 4/16: 824ft with rollator 10 meter walk test: 32.77 sec 4/16 13.05 sec no UE support   Berg Balance Scale: 37/56: 4/16 44/56  PATIENT SURVEYS:  FOTO 44 4/16: 46   TODAY'S TREATMENT: DATE: 11/01/22   Forward step over cane x 10 bil  Lateral step over cane x 10 bil   No UE support from pt and cues for improved heel contact on BLE   Standing single knee lift holding 5# weight in RUE x 10 BLE, then performed with 5# weight in LUE x 10 BLE.  Standing on airex pad eyes open 30 sec eyes closed x 10 sec. Performed 4 bouts with intermittent UE support as needed and min assist for safety to prevent anterior or posterior LOB.   Single UE Jimmye Norman carry with 5# ankle weights x 157ft in RUE then 161ft with weight in LUE. Prolonged therapeutic rest break between bouts due to BLE fatigue.      PATIENT EDUCATION: Education details:  Pt  educated throughout session about proper posture and technique with exercises. Improved exercise technique, movement at target joints, use of target muscles after min to mod verbal, visual, tactile cues   Person educated: Patient and Child(ren) Education method: Explanation Education comprehension: verbalized understanding  HOME EXERCISE PROGRAM:  Access Code: [email or web address] URL: [email or web address] Date: 08/16/2022 Prepared by: Grier Rocher  Exercises - Seated Calf Stretch with Strap  - 1 x daily - 7 x weekly - 3 sets - 10 reps - Seated Calf Stretch knee bent with Strap  - 1 x daily - 7 x weekly - 3 sets - 10 reps - Standing Hip Flexor Stretch  - 1 x daily - 7 x weekly - 3 sets - 10 reps  Access Code: [email or web address] URL: [email or web address] Date: 07/14/2022 Prepared by: Tomasa Hose  Exercises - Supine Bridge  - 1 x daily - 7 x weekly - 3 sets - 10 reps - Clamshell  - 1 x daily - 7 x weekly - 3 sets - 10 reps - Sidelying Hip Abduction  - 1 x daily -  7 x weekly - 3 sets - 10 reps - Supine Posterior Pelvic Tilt  - 1 x daily - 7 x weekly - 3 sets - 10 reps - Sidelying Reverse Clamshell  - 1 x daily - 7 x weekly - 3 sets - 10 reps - Active Straight Leg Raise with Quad Set  - 1 x daily - 7 x weekly - 3 sets - 10 reps  GOALS: Goals reviewed with patient? Yes  SHORT TERM GOALS: Target date: 11/01/2022    Pt will be independent with HEP in order to demonstrate increased ability to perform tasks related to occupation/hobbies. Baseline: Pt to be given HEP at next visit Goal status: IN PROGRESS  LONG TERM GOALS: Target date: 12/27/2022    1.  Patient (> 87 years old) will complete five times sit to stand test in < 10 seconds indicating an increased LE strength and improved balance. Baseline: 34.89 sec. 4/16 no UE support 10.36 Goal status: UPDATED  2.  Patient will increase FOTO score to equal to or greater than 54 to demonstrate statistically significant improvement in mobility and quality of life.  Baseline: FOTO: 44 4/16. 46  Goal status: IN PROGRESS   3.  Patient will increase Berg Balance score by >45 points to demonstrate decreased fall risk during functional activities. Baseline: BERG: 37  08/12/2022: 39.  4/16 44/56 Goal status: updated.    4.  Patient will reduce timed up and go to <11 seconds to reduce fall risk and demonstrate improved transfer/gait ability. Baseline: 27.56 sec 08/12/2022: 12.9 with no AD  4/16: 15.83  Goal status: IN PROGRESS  5.  Patient will increase 10 meter walk test to >1.39m/s as to improve gait speed for better community ambulation and to reduce fall risk. Baseline: 32.77 sec. 13.05 sec no UE support   Goal status: IN PROGRESS  6.  Patient will increase six minute walk test distance to >1000 for progression to community ambulator and improve gait ability Baseline: 73' with use of rollator 08/12/2022: 820'. 4/16 850ft  Goal status: IN PROGRESS   ASSESSMENT:  CLINICAL IMPRESSION:  Patient put  forth excellent effort towards all therapeutic activity on this day. PT treatment focused on dynamic core stabilization with functional movement. Noted to have significant difficulty with standing on airex pad with eyes closed. Will need to continue to challenge balance strategies with decrease use of visual feedback. Patient will benefit from  continued strength endurance and balance training as well as continued skilled therapy to address remaining deficits in order to improve overall QoL and return to PLOF.       OBJECTIVE IMPAIRMENTS: Abnormal gait, decreased activity tolerance, decreased balance, decreased endurance, decreased knowledge of use of DME, decreased mobility, difficulty walking, and decreased strength.   ACTIVITY LIMITATIONS: carrying, lifting, standing, squatting, stairs, transfers, bed mobility, continence, bathing, toileting, dressing, and locomotion level  PARTICIPATION LIMITATIONS: cleaning, laundry, driving, shopping, community activity, yard work, and church  PERSONAL FACTORS: Age, Fitness, Past/current experiences, and Time since onset of injury/illness/exacerbation are also affecting patient's functional outcome.   REHAB POTENTIAL: Good  CLINICAL DECISION MAKING: Stable/uncomplicated  EVALUATION COMPLEXITY: Low  PLAN:  PT FREQUENCY: 2x/week  PT DURATION: 12 weeks  PLANNED INTERVENTIONS: Therapeutic exercises, Therapeutic activity, Neuromuscular re-education, Balance training, Gait training, Patient/Family education, Self Care, Joint mobilization, Stair training, Vestibular training, Canalith repositioning, DME instructions, Aquatic Therapy, Dry Needling, Cryotherapy, Moist heat, Taping, Manual therapy, and Re-evaluation   PLAN FOR NEXT SESSION:   Continue to progress BLE strengthening program;  dynamic balance and gait training to facilitate improved step length on BLE.    Grier Rocher PT, DPT  Physical Therapist - [facility]  3:05 PM 11/01/22

## 2022-11-03 ENCOUNTER — Ambulatory Visit: Payer: Medicare PPO | Admitting: Physical Therapy

## 2022-11-10 ENCOUNTER — Ambulatory Visit: Payer: Medicare PPO | Admitting: Physical Therapy

## 2022-11-10 DIAGNOSIS — M6281 Muscle weakness (generalized): Secondary | ICD-10-CM | POA: Diagnosis not present

## 2022-11-10 DIAGNOSIS — R262 Difficulty in walking, not elsewhere classified: Secondary | ICD-10-CM

## 2022-11-10 DIAGNOSIS — R29898 Other symptoms and signs involving the musculoskeletal system: Secondary | ICD-10-CM

## 2022-11-11 NOTE — Therapy (Signed)
OUTPATIENT PHYSICAL THERAPY NEURO TREATMENT   Patient Name: Heather Cooper MRN: [id number] DOB:06/01/1931, 89 y.o., female Today's Date: 11/11/2022   PCP: Dale [place], MD REFERRING PROVIDER: Louretta Shorten, MD  END OF SESSION:  PT End of Session - 11/11/22 0752     Visit Number 29    Number of Visits 46    Date for PT Re-Evaluation 12/27/22    Progress Note Due on Visit 30    PT Start Time 1147    PT Stop Time 1230    PT Time Calculation (min) 43 min    Equipment Utilized During Treatment Gait belt    Activity Tolerance Patient tolerated treatment well    Behavior During Therapy WFL for tasks assessed/performed                  Past Medical History:  Diagnosis Date   Chemotherapy induced nausea and vomiting    Chicken pox    Esophageal cancer (HCC)    Hypercalcemia    familial hypocalciuric hypercalcemia   Hypercholesterolemia    Hypertension    Lung cancer (HCC)    Osteoporosis    Thyroid disease    Past Surgical History:  Procedure Laterality Date   ABDOMINAL HYSTERECTOMY     partial   CHOLECYSTECTOMY     COLONOSCOPY WITH PROPOFOL N/A 04/17/2017   Procedure: COLONOSCOPY WITH PROPOFOL;  Surgeon: Scot Jun, MD;  Location[facility] ENDOSCOPY;  Service: Endoscopy;  Laterality: N/A;   ESOPHAGOGASTRODUODENOSCOPY (EGD) WITH PROPOFOL N/A 04/13/2021   Procedure: ESOPHAGOGASTRODUODENOSCOPY (EGD) WITH PROPOFOL;  Surgeon: Midge Minium, MD;  Location: [facility] ENDOSCOPY;  Service: Endoscopy;  Laterality: N/A;   EUS N/A 04/22/2021   Procedure: FULL UPPER ENDOSCOPIC ULTRASOUND (EUS) RADIAL;  Surgeon: Rayann Heman, MD;  Location: [facility] ENDOSCOPY;  Service: Endoscopy;  Laterality: N/A;  [facility] needed   EUS N/A 10/28/2021   Procedure: UPPER ENDOSCOPIC ULTRASOUND (EUS) LINEAR;  Surgeon: Doren Custard, MD;  Location: [facility] ENDOSCOPY;  Service: Gastroenterology;  Laterality: N/A;  [facility]   transvaginal hysterectomy  04/18/05   with anterior colporrhaphy    Patient Active Problem List   Diagnosis Date Noted   Encounter for immunotherapy 04/26/2022   Low TSH level 11/23/2021   PAD (peripheral artery disease) (HCC) 08/28/2021   Emphysema of lung (HCC) 08/28/2021   Esophageal cancer (HCC) 05/30/2021   Diarrhea 05/30/2021   Neoplasm of middle third of esophagus 05/10/2021   Stricture and stenosis of esophagus    Dysphagia 01/22/2021   Type 2 diabetes mellitus with hyperglycemia (HCC) 01/19/2021   COVID-19 virus infection 06/21/2020   Anemia 05/11/2020   Malignant neoplasm of right upper lobe of lung (HCC) 08/07/2019   URI (upper respiratory infection) 09/16/2016   Health care maintenance 05/18/2016   History of lung cancer 02/08/2016   Abdominal aortic aneurysm (HCC) 08/20/2015   Abnormal CXR 03/06/2014   Cough 03/06/2014   Headache(784.0) 03/06/2014   Hyperglycemia 02/07/2013   Hypertension 06/26/2012   Hypercholesterolemia 06/26/2012   Osteoporosis 06/26/2012   Hypercalcemia 06/26/2012   GERD (gastroesophageal reflux disease) 06/26/2012    ONSET DATE: 9 months, 10/2021  REFERRING DIAG: M62.81 (ICD-10-CM) - Muscle weakness (generalized)   THERAPY DIAG:  Muscle weakness (generalized)  Weakness of both hips  Difficulty in walking, not elsewhere classified  Rationale for Evaluation and Treatment: Rehabilitation  SUBJECTIVE:  SUBJECTIVE STATEMENT:  Patient reports that she is doing well.  No medical updates since last PT treatment.  Patient reports that she continues to feel stronger and safer with gait at home and in the community.   Pt accompanied by: self,  PERTINENT HISTORY:  Pt developed has history of cancer x2, but is in remission.  Pt has been on prednisone for 3 months, but should be off it in the next few days.  MD feels as  though that has contributed to her weakness.  Pt has caregiver that comes over 3x week that helps washing clothes due to having some things on the ground floor.  PAIN:  Are you having pain? No  PRECAUTIONS: Other: Cancer  WEIGHT BEARING RESTRICTIONS: No  FALLS: Has patient fallen in last 6 months? No  LIVING ENVIRONMENT: Lives with: lives alone Lives in: House/apartment Stairs: Yes: Internal: 13 steps; can reach both Has following equipment at home: Single point cane, Walker - 4 wheeled, shower chair, and Grab bars  PLOF: Independent and needing some assistance with driving.  PATIENT GOALS: To get stronger and stop utilizing the walker as much.  OBJECTIVE:   DIAGNOSTIC FINDINGS:   EXAM: NUCLEAR MEDICINE PET SKULL BASE TO THIGH  IMPRESSION: 1. No scintigraphic evidence of residual or recurrent esophageal hypermetabolism. 2. No evidence of hypermetabolic metastatic disease. 3. Similar aneurysmal dilation of the infrarenal abdominal aorta measuring 4.6 cm. Recommend follow-up CT/MR every 6 months and vascular consultation. This recommendation follows ACR consensus guidelines: White Paper of the ACR Incidental Findings Committee II on Vascular Findings. J Am Coll Radiol 2013; 82:956-213. 4. Aortic Atherosclerosis (ICD10-I70.0) and Emphysema (ICD10-J43.9).  COGNITION: Overall cognitive status: Within functional limits for tasks assessed   SENSATION: WFL  COORDINATION: WFL  POSTURE: No Significant postural limitations   LOWER EXTREMITY MMT:    MMT Right Eval Left Eval 4/16 R 4/16  L   Hip flexion 4 4 4 4   Hip abduction 4 4 4+ 4+  Hip adduction 4- 4- 4+ 4+  Knee flexion 4- 4- 4 4  Knee extension 4- 4- 4 4  Ankle dorsiflexion 4- 4- 4 4  (Blank rows = not tested)  FUNCTIONAL TESTS:  5 times sit to stand: 34.89 sec 4/16: with UE support 11.44; UE support on thighs: 11.43; no UE support 10.36   Timed up and go (TUG): 27.56 sec 4/16: 15.83  6 minute walk test:  61' with use of rollator 4/16: 839ft with rollator 10 meter walk test: 32.77 sec 4/16 13.05 sec no UE support   Berg Balance Scale: 37/56: 4/16 44/56  PATIENT SURVEYS:  FOTO 44 4/16: 46   TODAY'S TREATMENT: DATE: 11/11/22  nustep level 2-3  x5 min with cues for full ROM on BLE/BLE   Seated therex:  Long quad with 3 pound ankle weights 2 x 12 Hamstring curls with red Thera-Band 2 x 12 Sit to stand without upper extremity support 2 x 10 with contact-guard assist for improved anterior weight shift Seated clamshells with red Thera-Band 2 x 12 Standing calf raises with upper extremity support on walker 2 x 15. Forward step over cane on floor with 3 pound ankle weights 2 x 10 no upper extremity support.  Gait training with 3 pound ankle weights and no upper extremity support 2 x 150 feet.  Contact-guard to supervision assist from PT for improved safety and cues for increased step length on bilateral lower extremity as well as decreased force of heel contact on the left lower  extremity.  Robin  PATIENT EDUCATION: Education details:  Pt educated throughout session about proper posture and technique with exercises. Improved exercise technique, movement at target joints, use of target muscles after min to mod verbal, visual, tactile cues   Person educated: Patient and Child(ren) Education method: Explanation Education comprehension: verbalized understanding  HOME EXERCISE PROGRAM:  Access Code: [email or web address] URL: [email or web address] Date: 08/16/2022 Prepared by: Grier Rocher  Exercises - Seated Calf Stretch with Strap  - 1 x daily - 7 x weekly - 3 sets - 10 reps - Seated Calf Stretch knee bent with Strap  - 1 x daily - 7 x weekly - 3 sets - 10 reps - Standing Hip Flexor Stretch  - 1 x daily - 7 x weekly - 3 sets - 10 reps  Access Code: [email or web address] URL: [email or web address] Date: 07/14/2022 Prepared by: Tomasa Hose  Exercises - Supine Bridge  - 1 x daily  - 7 x weekly - 3 sets - 10 reps - Clamshell  - 1 x daily - 7 x weekly - 3 sets - 10 reps - Sidelying Hip Abduction  - 1 x daily - 7 x weekly - 3 sets - 10 reps - Supine Posterior Pelvic Tilt  - 1 x daily - 7 x weekly - 3 sets - 10 reps - Sidelying Reverse Clamshell  - 1 x daily - 7 x weekly - 3 sets - 10 reps - Active Straight Leg Raise with Quad Set  - 1 x daily - 7 x weekly - 3 sets - 10 reps  GOALS: Goals reviewed with patient? Yes  SHORT TERM GOALS: Target date: 11/01/2022    Pt will be independent with HEP in order to demonstrate increased ability to perform tasks related to occupation/hobbies. Baseline: Pt to be given HEP at next visit Goal status: IN PROGRESS  LONG TERM GOALS: Target date: 12/27/2022    1.  Patient (> 35 years old) will complete five times sit to stand test in < 10 seconds indicating an increased LE strength and improved balance. Baseline: 34.89 sec. 4/16 no UE support 10.36 Goal status: UPDATED  2.  Patient will increase FOTO score to equal to or greater than 54 to demonstrate statistically significant improvement in mobility and quality of life.  Baseline: FOTO: 44 4/16. 46  Goal status: IN PROGRESS   3.  Patient will increase Berg Balance score by >45 points to demonstrate decreased fall risk during functional activities. Baseline: BERG: 37  08/12/2022: 39.  4/16 44/56 Goal status: updated.    4.  Patient will reduce timed up and go to <11 seconds to reduce fall risk and demonstrate improved transfer/gait ability. Baseline: 27.56 sec 08/12/2022: 12.9 with no AD  4/16: 15.83  Goal status: IN PROGRESS  5.  Patient will increase 10 meter walk test to >1.62m/s as to improve gait speed for better community ambulation and to reduce fall risk. Baseline: 32.77 sec. 13.05 sec no UE support   Goal status: IN PROGRESS  6.  Patient will increase six minute walk test distance to >1000 for progression to community ambulator and improve gait ability Baseline: 73'  with use of rollator 08/12/2022: 820'. 4/16 837ft  Goal status: IN PROGRESS   ASSESSMENT:  CLINICAL IMPRESSION:  Patient put forth excellent effort towards all therapeutic activity on this day. PT treatment focused on improved bilateral lower extremity strength.  Patient able to complete tasks with min cues for proper technique and cues for decreased speed of eccentric movement.  Noted  to have improved step length on second bouts of weighted gait training requiring decreased assistance from his physical therapist.  Patient will benefit from continued strength endurance and balance training as well as continued skilled therapy to address remaining deficits in order to improve overall QoL and return to PLOF.       OBJECTIVE IMPAIRMENTS: Abnormal gait, decreased activity tolerance, decreased balance, decreased endurance, decreased knowledge of use of DME, decreased mobility, difficulty walking, and decreased strength.   ACTIVITY LIMITATIONS: carrying, lifting, standing, squatting, stairs, transfers, bed mobility, continence, bathing, toileting, dressing, and locomotion level  PARTICIPATION LIMITATIONS: cleaning, laundry, driving, shopping, community activity, yard work, and church  PERSONAL FACTORS: Age, Fitness, Past/current experiences, and Time since onset of injury/illness/exacerbation are also affecting patient's functional outcome.   REHAB POTENTIAL: Good  CLINICAL DECISION MAKING: Stable/uncomplicated  EVALUATION COMPLEXITY: Low  PLAN:  PT FREQUENCY: 2x/week  PT DURATION: 12 weeks  PLANNED INTERVENTIONS: Therapeutic exercises, Therapeutic activity, Neuromuscular re-education, Balance training, Gait training, Patient/Family education, Self Care, Joint mobilization, Stair training, Vestibular training, Canalith repositioning, DME instructions, Aquatic Therapy, Dry Needling, Cryotherapy, Moist heat, Taping, Manual therapy, and Re-evaluation   PLAN FOR NEXT SESSION:   Continue to  progress BLE strengthening program;  Adjust HEP as needed.  dynamic balance and gait training to facilitate improved step length on BLE.    Grier Rocher PT, DPT  Physical Therapist -   [facility]  7:53 AM 11/11/22

## 2022-11-15 ENCOUNTER — Ambulatory Visit: Payer: Medicare PPO | Attending: Internal Medicine | Admitting: Physical Therapy

## 2022-11-15 DIAGNOSIS — R262 Difficulty in walking, not elsewhere classified: Secondary | ICD-10-CM | POA: Insufficient documentation

## 2022-11-15 DIAGNOSIS — R29898 Other symptoms and signs involving the musculoskeletal system: Secondary | ICD-10-CM | POA: Diagnosis not present

## 2022-11-15 DIAGNOSIS — M6281 Muscle weakness (generalized): Secondary | ICD-10-CM | POA: Insufficient documentation

## 2022-11-15 NOTE — Therapy (Signed)
OUTPATIENT PHYSICAL THERAPY NEURO TREATMENT/ PHYSICAL THERAPY PROGRESS NOTE   Dates of reporting period  09/27/2022   to   11/15/2022     Patient Name: Heather Cooper MRN: [id number] DOB:Oct 25, 1930, 89 y.o., female Today's Date: 11/15/2022   PCP: Dale [place], MD REFERRING PROVIDER: Louretta Shorten, MD  END OF SESSION:  PT End of Session - 11/15/22 1000     Visit Number 30    Number of Visits 46    Date for PT Re-Evaluation 12/27/22    Progress Note Due on Visit 40    PT Start Time 1010    PT Stop Time 1051    PT Time Calculation (min) 41 min    Equipment Utilized During Treatment Gait belt    Activity Tolerance Patient tolerated treatment well    Behavior During Therapy WFL for tasks assessed/performed                  Past Medical History:  Diagnosis Date   Chemotherapy induced nausea and vomiting    Chicken pox    Esophageal cancer (HCC)    Hypercalcemia    familial hypocalciuric hypercalcemia   Hypercholesterolemia    Hypertension    Lung cancer (HCC)    Osteoporosis    Thyroid disease    Past Surgical History:  Procedure Laterality Date   ABDOMINAL HYSTERECTOMY     partial   CHOLECYSTECTOMY     COLONOSCOPY WITH PROPOFOL N/A 04/17/2017   Procedure: COLONOSCOPY WITH PROPOFOL;  Surgeon: Scot Jun, MD;  Location: [facility] ENDOSCOPY;  Service: Endoscopy;  Laterality: N/A;   ESOPHAGOGASTRODUODENOSCOPY (EGD) WITH PROPOFOL N/A 04/13/2021   Procedure: ESOPHAGOGASTRODUODENOSCOPY (EGD) WITH PROPOFOL;  Surgeon: Midge Minium, MD;  Location: [facility] ENDOSCOPY;  Service: Endoscopy;  Laterality: N/A;   EUS N/A 04/22/2021   Procedure: FULL UPPER ENDOSCOPIC ULTRASOUND (EUS) RADIAL;  Surgeon: Rayann Heman, MD;  Location: [facility] ENDOSCOPY;  Service: Endoscopy;  Laterality: N/A;  [facility] needed   EUS N/A 10/28/2021   Procedure: UPPER ENDOSCOPIC ULTRASOUND (EUS) LINEAR;  Surgeon: Doren Custard, MD;  Location: [facility] ENDOSCOPY;  Service: Gastroenterology;   Laterality: N/A;  [facility]   transvaginal hysterectomy  04/18/05   with anterior colporrhaphy   Patient Active Problem List   Diagnosis Date Noted   Encounter for immunotherapy 04/26/2022   Low TSH level 11/23/2021   PAD (peripheral artery disease) (HCC) 08/28/2021   Emphysema of lung (HCC) 08/28/2021   Esophageal cancer (HCC) 05/30/2021   Diarrhea 05/30/2021   Neoplasm of middle third of esophagus 05/10/2021   Stricture and stenosis of esophagus    Dysphagia 01/22/2021   Type 2 diabetes mellitus with hyperglycemia (HCC) 01/19/2021   COVID-19 virus infection 06/21/2020   Anemia 05/11/2020   Malignant neoplasm of right upper lobe of lung (HCC) 08/07/2019   URI (upper respiratory infection) 09/16/2016   Health care maintenance 05/18/2016   History of lung cancer 02/08/2016   Abdominal aortic aneurysm (HCC) 08/20/2015   Abnormal CXR 03/06/2014   Cough 03/06/2014   Headache(784.0) 03/06/2014   Hyperglycemia 02/07/2013   Hypertension 06/26/2012   Hypercholesterolemia 06/26/2012   Osteoporosis 06/26/2012   Hypercalcemia 06/26/2012   GERD (gastroesophageal reflux disease) 06/26/2012    ONSET DATE: 9 months, 10/2021  REFERRING DIAG: M62.81 (ICD-10-CM) - Muscle weakness (generalized)   THERAPY DIAG:  Muscle weakness (generalized)  Difficulty in walking, not elsewhere classified  Weakness of both hips  Rationale for Evaluation and Treatment: Rehabilitation  SUBJECTIVE:  SUBJECTIVE STATEMENT:  Patient reports that she is doing well.  Patient reports mild left shoulder pain on this day no known cause applied arthritis gel for pain relief prior to therapy session.  Patient states she may need to stop PT at the end of June as family will be going on prolonged vacation.   Pt accompanied by: self,   PERTINENT HISTORY:  Pt developed has history of cancer x2, but is in remission.  Pt has been on prednisone for 3 months, but should be off it in the next few days.  MD feels as though that has contributed to her weakness.  Pt has caregiver that comes over 3x week that helps washing clothes due to having some things on the ground floor.  PAIN:  Are you having pain? No  PRECAUTIONS: Other: Cancer  WEIGHT BEARING RESTRICTIONS: No  FALLS: Has patient fallen in last 6 months? No  LIVING ENVIRONMENT: Lives with: lives alone Lives in: House/apartment Stairs: Yes: Internal: 13 steps; can reach both Has following equipment at home: Single point cane, Walker - 4 wheeled, shower chair, and Grab bars  PLOF: Independent and needing some assistance with driving.  PATIENT GOALS: To get stronger and stop utilizing the walker as much.  OBJECTIVE:   DIAGNOSTIC FINDINGS:   EXAM: NUCLEAR MEDICINE PET SKULL BASE TO THIGH  IMPRESSION: 1. No scintigraphic evidence of residual or recurrent esophageal hypermetabolism. 2. No evidence of hypermetabolic metastatic disease. 3. Similar aneurysmal dilation of the infrarenal abdominal aorta measuring 4.6 cm. Recommend follow-up CT/MR every 6 months and vascular consultation. This recommendation follows ACR consensus guidelines: White Paper of the ACR Incidental Findings Committee II on Vascular Findings. J Am Coll Radiol 2013; 16:109-604. 4. Aortic Atherosclerosis (ICD10-I70.0) and Emphysema (ICD10-J43.9).  COGNITION: Overall cognitive status: Within functional limits for tasks assessed   SENSATION: WFL  COORDINATION: WFL  POSTURE: No Significant postural limitations   LOWER EXTREMITY MMT:    MMT Right Eval Left Eval 4/16 R 4/16  L   Hip flexion 4 4 4 4   Hip abduction 4 4 4+ 4+  Hip adduction 4- 4- 4+ 4+  Knee flexion 4- 4- 4 4  Knee extension 4- 4- 4 4  Ankle dorsiflexion 4- 4- 4 4  (Blank rows = not tested)  FUNCTIONAL TESTS:   5 times sit to stand: 34.89 sec 4/16: with UE support 11.44; UE support on thighs: 11.43; no UE support 10.36   Timed up and go (TUG): 27.56 sec 4/16: 15.83  6 minute walk test: 56' with use of rollator 4/16: 870ft with rollator 10 meter walk test: 32.77 sec 4/16 13.05 sec no UE support   Berg Balance Scale: 37/56: 4/16 44/56  PATIENT SURVEYS:  FOTO 44 4/16: 46   TODAY'S TREATMENT: DATE: 11/15/22   [facility] PT Assessment - 11/15/22 0001       Berg Balance Test   Sit to Stand Able to stand without using hands and stabilize independently    Standing Unsupported Able to stand safely 2 minutes    Sitting with Back Unsupported but Feet Supported on Floor or Stool Able to sit safely and securely 2 minutes    Stand to Sit Sits safely with minimal use of hands    Transfers Able to transfer safely, minor use of hands    Standing Unsupported with Eyes Closed Able to stand 10 seconds with supervision    Standing Unsupported with Feet Together Able to place feet together independently and stand 1 minute safely  From Standing, Reach Forward with Outstretched Arm Can reach forward >12 cm safely (5")    From Standing Position, Pick up Object from Floor Able to pick up shoe, needs supervision    From Standing Position, Turn to Look Behind Over each Shoulder Looks behind from both sides and weight shifts well    Turn 360 Degrees Able to turn 360 degrees safely but slowly    Standing Unsupported, Alternately Place Feet on Step/Stool Able to complete >2 steps/needs minimal assist    Standing Unsupported, One Foot in Front Able to plae foot ahead of the other independently and hold 30 seconds    Standing on One Leg Tries to lift leg/unable to hold 3 seconds but remains standing independently    Total Score 44           Pt performed 5 time sit<>stand (5xSTS): 10.93 sec(11.61, 10.25) (>15 sec indicates increased fall risk)   PT instructed pt in TUG: 15.66 with Rollator: (16.77, 14.55) no AD: 15.5sec  (15.46, 15.54) sec (average of 3 trials; >13.5 sec indicates increased fall risk)  6 Min Walk Test:  Instructed patient to ambulate as quickly and as safely as possible for 6 minutes using LRAD. Patient was allowed to take standing rest breaks without stopping the test, but if the patient required a sitting rest break the clock would be stopped and the test would be over.  Results: 924 feet  using a rollator with supervision assist . Results indicate that the patient has reduced endurance with ambulation compared to age matched norms.  Age Matched Norms: 20-69 yo M: 28 F: 79, 7-79 yo M: 22 F: 471, 70-89 yo M: 417 F: 392 MDC: 58.21 meters (190.98 feet) or 50 meters (ANPTA Core Set of Outcome Measures for Adults with Neurologic Conditions, 2018)   Patient demonstrates increased fall risk as noted by score of   44 /56 on Berg Balance Scale.  (<36= high risk for falls, close to 100%; 37-45 significant >80%; 46-51 moderate >50%; 52-55 lower >25%)    10 Meter Walk Test: Patient instructed to walk 10 meters (32.8 ft) as quickly and as safely as possible at their normal speed x2 and at a fast speed x2. Time measured from 2 meter mark to 8 meter mark to accommodate ramp-up and ramp-down.  Normal speed 1: 13.73 m/s Normal speed 2:  12.83 sec m/s Average Normal speed: 13.28sec.  0.75 m/s Fast speed 1: 10.58 m/s Fast speed 2: 10.86 m/s Average Fast speed: 10.72sec.  0.93 m/s Cut off scores: <0.4 m/s = household Ambulator, 0.4-0.8 m/s = limited community Ambulator, >0.8 m/s = community Ambulator, >1.2 m/s = crossing a street, <1.0 = increased fall risk MCID 0.05 m/s (small), 0.13 m/s (moderate), 0.06 m/s (significant)  (ANPTA Core Set of Outcome Measures for Adults with Neurologic Conditions, 2018)   Foto: 47   PATIENT EDUCATION: Education details:  Pt educated throughout session about proper posture and technique with exercises. Improved exercise technique, movement at target joints, use of  target muscles after min to mod verbal, visual, tactile cues   Person educated: Patient and Child(ren) Education method: Explanation Education comprehension: verbalized understanding  HOME EXERCISE PROGRAM:  Access Code: [email or web address] URL: [email or web address] Date: 08/16/2022 Prepared by: Grier Rocher  Exercises - Seated Calf Stretch with Strap  - 1 x daily - 7 x weekly - 3 sets - 10 reps - Seated Calf Stretch knee bent with Strap  - 1 x daily - 7 x weekly - 3 sets -  10 reps - Standing Hip Flexor Stretch  - 1 x daily - 7 x weekly - 3 sets - 10 reps  Access Code: [email or web address] URL: [email or web address] Date: 07/14/2022 Prepared by: Tomasa Hose  Exercises - Supine Bridge  - 1 x daily - 7 x weekly - 3 sets - 10 reps - Clamshell  - 1 x daily - 7 x weekly - 3 sets - 10 reps - Sidelying Hip Abduction  - 1 x daily - 7 x weekly - 3 sets - 10 reps - Supine Posterior Pelvic Tilt  - 1 x daily - 7 x weekly - 3 sets - 10 reps - Sidelying Reverse Clamshell  - 1 x daily - 7 x weekly - 3 sets - 10 reps - Active Straight Leg Raise with Quad Set  - 1 x daily - 7 x weekly - 3 sets - 10 reps  GOALS: Goals reviewed with patient? Yes  SHORT TERM GOALS: Target date: 11/01/2022    Pt will be independent with HEP in order to demonstrate increased ability to perform tasks related to occupation/hobbies. Baseline: Pt to be given HEP at next visit Goal status: IN PROGRESS  LONG TERM GOALS: Target date: 12/27/2022    1.  Patient (> 58 years old) will complete five times sit to stand test in < 10 seconds indicating an increased LE strength and improved balance. Baseline: 34.89 sec. 4/16 no UE support 10.36 11/15/2022: 10.93 sec Goal status: UPDATED  2.  Patient will increase FOTO score to equal to or greater than 54 to demonstrate statistically significant improvement in mobility and quality of life.  Baseline: FOTO: 44 4/16. 46  11/15/2022: 47 Goal status: IN PROGRESS   3.   Patient will increase Berg Balance score by >45 points to demonstrate decreased fall risk during functional activities. Baseline: BERG: 37  08/12/2022: 39.  4/16 44/56 11/15/2022 44/56 Goal status: updated.    4.  Patient will reduce timed up and go to <11 seconds to reduce fall risk and demonstrate improved transfer/gait ability. Baseline: 27.56 sec 08/12/2022: 12.9 with no AD  4/16: 15.83  11/15/2022 15.66 with Rollator Goal status: IN PROGRESS  5.  Patient will increase 10 meter walk test to >1.81m/s as to improve gait speed for better community ambulation and to reduce fall risk. Baseline: 32.77 sec. 4.16 13.05 sec no UE support   11/15/2022: 10.72sec.  0.93 m/s with rollator  Goal status: IN PROGRESS  6.  Patient will increase six minute walk test distance to >1000 for progression to community ambulator and improve gait ability Baseline: 36' with use of rollator 08/12/2022: 820'. 4/16 867ft  Goal status: IN PROGRESS   ASSESSMENT:  CLINICAL IMPRESSION:  Patient put forth excellent effort towards all therapeutic activity on this day. PT instructed pt in progress assessment to measure progress toward goals. See above for details.  Patient demonstrates mild improvement in tug, 6-minute walk test, and 10 m walk test.  No change in Berg balance scale score but mild improvement in turning tasks, but decreased safety with reciprocal foot taps. Patient's condition has the potential to improve in response to therapy. Maximum improvement is yet to be obtained. The anticipated improvement is attainable and reasonable in a generally predictable time.  Patient will benefit from continued strength endurance and balance training as well as continued skilled therapy to address remaining deficits in order to improve overall QoL and return to PLOF.       OBJECTIVE IMPAIRMENTS: Abnormal gait, decreased activity tolerance,  decreased balance, decreased endurance, decreased knowledge of use of DME, decreased  mobility, difficulty walking, and decreased strength.   ACTIVITY LIMITATIONS: carrying, lifting, standing, squatting, stairs, transfers, bed mobility, continence, bathing, toileting, dressing, and locomotion level  PARTICIPATION LIMITATIONS: cleaning, laundry, driving, shopping, community activity, yard work, and church  PERSONAL FACTORS: Age, Fitness, Past/current experiences, and Time since onset of injury/illness/exacerbation are also affecting patient's functional outcome.   REHAB POTENTIAL: Good  CLINICAL DECISION MAKING: Stable/uncomplicated  EVALUATION COMPLEXITY: Low  PLAN:  PT FREQUENCY: 2x/week  PT DURATION: 12 weeks  PLANNED INTERVENTIONS: Therapeutic exercises, Therapeutic activity, Neuromuscular re-education, Balance training, Gait training, Patient/Family education, Self Care, Joint mobilization, Stair training, Vestibular training, Canalith repositioning, DME instructions, Aquatic Therapy, Dry Needling, Cryotherapy, Moist heat, Taping, Manual therapy, and Re-evaluation   PLAN FOR NEXT SESSION:   Continue to progress BLE strengthening program;  Adjust HEP as needed.  dynamic balance and gait training to facilitate improved step length on BLE.    Grier Rocher PT, DPT  Physical Therapist - [facility]  11:02 AM 11/15/22

## 2022-11-16 ENCOUNTER — Ambulatory Visit: Payer: Medicare PPO | Admitting: Physical Therapy

## 2022-11-16 DIAGNOSIS — R29898 Other symptoms and signs involving the musculoskeletal system: Secondary | ICD-10-CM | POA: Diagnosis not present

## 2022-11-16 DIAGNOSIS — M6281 Muscle weakness (generalized): Secondary | ICD-10-CM

## 2022-11-16 DIAGNOSIS — R262 Difficulty in walking, not elsewhere classified: Secondary | ICD-10-CM

## 2022-11-16 NOTE — Therapy (Signed)
OUTPATIENT PHYSICAL THERAPY NEURO TREATMENT    Patient Name: MONNA SHIELS MRN: [id number] DOB:07/16/1930, 89 y.o., female Today's Date: 11/16/2022   PCP: Dale East Gillespie, MD REFERRING PROVIDER: Louretta Shorten, MD  END OF SESSION:  PT End of Session - 11/16/22 0850     Visit Number 31    Number of Visits 46    Date for PT Re-Evaluation 12/27/22    Progress Note Due on Visit 40    PT Start Time 0848    PT Stop Time 0930    PT Time Calculation (min) 42 min    Equipment Utilized During Treatment Gait belt    Activity Tolerance Patient tolerated treatment well    Behavior During Therapy WFL for tasks assessed/performed                  Past Medical History:  Diagnosis Date   Chemotherapy induced nausea and vomiting    Chicken pox    Esophageal cancer (HCC)    Hypercalcemia    familial hypocalciuric hypercalcemia   Hypercholesterolemia    Hypertension    Lung cancer (HCC)    Osteoporosis    Thyroid disease    Past Surgical History:  Procedure Laterality Date   ABDOMINAL HYSTERECTOMY     partial   CHOLECYSTECTOMY     COLONOSCOPY WITH PROPOFOL N/A 04/17/2017   Procedure: COLONOSCOPY WITH PROPOFOL;  Surgeon: Scot Jun, MD;  Location: [facility] ENDOSCOPY;  Service: Endoscopy;  Laterality: N/A;   ESOPHAGOGASTRODUODENOSCOPY (EGD) WITH PROPOFOL N/A 04/13/2021   Procedure: ESOPHAGOGASTRODUODENOSCOPY (EGD) WITH PROPOFOL;  Surgeon: Midge Minium, MD;  Location: [facility] ENDOSCOPY;  Service: Endoscopy;  Laterality: N/A;   EUS N/A 04/22/2021   Procedure: FULL UPPER ENDOSCOPIC ULTRASOUND (EUS) RADIAL;  Surgeon: Rayann Heman, MD;  Location: [facility] ENDOSCOPY;  Service: Endoscopy;  Laterality: N/A;  [facility] needed   EUS N/A 10/28/2021   Procedure: UPPER ENDOSCOPIC ULTRASOUND (EUS) LINEAR;  Surgeon: Doren Custard, MD;  Location: [facility] ENDOSCOPY;  Service: Gastroenterology;  Laterality: N/A;  [facility]   transvaginal hysterectomy  04/18/05   with anterior colporrhaphy    Patient Active Problem List   Diagnosis Date Noted   Encounter for immunotherapy 04/26/2022   Low TSH level 11/23/2021   PAD (peripheral artery disease) (HCC) 08/28/2021   Emphysema of lung (HCC) 08/28/2021   Esophageal cancer (HCC) 05/30/2021   Diarrhea 05/30/2021   Neoplasm of middle third of esophagus 05/10/2021   Stricture and stenosis of esophagus    Dysphagia 01/22/2021   Type 2 diabetes mellitus with hyperglycemia (HCC) 01/19/2021   COVID-19 virus infection 06/21/2020   Anemia 05/11/2020   Malignant neoplasm of right upper lobe of lung (HCC) 08/07/2019   URI (upper respiratory infection) 09/16/2016   Health care maintenance 05/18/2016   History of lung cancer 02/08/2016   Abdominal aortic aneurysm (HCC) 08/20/2015   Abnormal CXR 03/06/2014   Cough 03/06/2014   Headache(784.0) 03/06/2014   Hyperglycemia 02/07/2013   Hypertension 06/26/2012   Hypercholesterolemia 06/26/2012   Osteoporosis 06/26/2012   Hypercalcemia 06/26/2012   GERD (gastroesophageal reflux disease) 06/26/2012    ONSET DATE: 10/2021  REFERRING DIAG: M62.81 (ICD-10-CM) - Muscle weakness (generalized)   THERAPY DIAG:  Muscle weakness (generalized)  Difficulty in walking, not elsewhere classified  Weakness of both hips  Rationale for Evaluation and Treatment: Rehabilitation  SUBJECTIVE:  SUBJECTIVE STATEMENT:  Patient reports that she is doing well. No more pain in the L shoulder. No other medical updates   Pt accompanied by: self,  PERTINENT HISTORY:  Pt developed has history of cancer x2, but is in remission.  Pt has been on prednisone for 3 months, but should be off it in the next few days.  MD feels as though that has contributed to her weakness.  Pt has caregiver that comes over 3x week that helps washing  clothes due to having some things on the ground floor.  PAIN:  Are you having pain? No  PRECAUTIONS: Other: Cancer  WEIGHT BEARING RESTRICTIONS: No  FALLS: Has patient fallen in last 6 months? No  LIVING ENVIRONMENT: Lives with: lives alone Lives in: House/apartment Stairs: Yes: Internal: 13 steps; can reach both Has following equipment at home: Single point cane, Walker - 4 wheeled, shower chair, and Grab bars  PLOF: Independent and needing some assistance with driving.  PATIENT GOALS: To get stronger and stop utilizing the walker as much.  OBJECTIVE:   DIAGNOSTIC FINDINGS:   EXAM: NUCLEAR MEDICINE PET SKULL BASE TO THIGH  IMPRESSION: 1. No scintigraphic evidence of residual or recurrent esophageal hypermetabolism. 2. No evidence of hypermetabolic metastatic disease. 3. Similar aneurysmal dilation of the infrarenal abdominal aorta measuring 4.6 cm. Recommend follow-up CT/MR every 6 months and vascular consultation. This recommendation follows ACR consensus guidelines: White Paper of the ACR Incidental Findings Committee II on Vascular Findings. J Am Coll Radiol 2013; 09:811-914. 4. Aortic Atherosclerosis (ICD10-I70.0) and Emphysema (ICD10-J43.9).  COGNITION: Overall cognitive status: Within functional limits for tasks assessed   SENSATION: WFL  COORDINATION: WFL  POSTURE: No Significant postural limitations   LOWER EXTREMITY MMT:    MMT Right Eval Left Eval 4/16 R 4/16  L   Hip flexion 4 4 4 4   Hip abduction 4 4 4+ 4+  Hip adduction 4- 4- 4+ 4+  Knee flexion 4- 4- 4 4  Knee extension 4- 4- 4 4  Ankle dorsiflexion 4- 4- 4 4  (Blank rows = not tested)  FUNCTIONAL TESTS:  5 times sit to stand: 34.89 sec 4/16: with UE support 11.44; UE support on thighs: 11.43; no UE support 10.36   Timed up and go (TUG): 27.56 sec 4/16: 15.83  6 minute walk test: 20' with use of rollator 4/16: 824ft with rollator 10 meter walk test: 32.77 sec 4/16 13.05 sec no UE  support   Berg Balance Scale: 37/56: 4/16 44/56  PATIENT SURVEYS:  FOTO 44 4/16: 46   TODAY'S TREATMENT: DATE: 11/16/22   Forward step over bolster no UE support 2 x 10 bil  Lateral step over bolster no UE support 2 x 10 bil   Gait with SPC in RUE x 151ft with cues for decreased tension in the LUE as well as increased step length for 4 step through gait pattern noted to have improved step length with single-point cane compared to use of rollator while ambulating into therapy gym..    Dynamic gait with SPC step over 5 internal, we have started 6 cones, step overs 3 bolsters, step across Airex beam, up-and-down 6 inch step.  Performed x 2 bouts for more than a year daily Prolonged seated rest break.  Contact-guard assist from PT for safety  Obstacle navigation without AD as listed above.  With min assist while ambulating across Airex beam and up-and-down 5 inch step.  Performed x 2 reps.  Noted improved gait pattern with decreased step  width, and improved weight shifting right and left.  [place]it[place]:With weight shift into lead LE and hip extension on trail LE performed 2 x 10 bilateral lower extremity.  Bolster between lower extremities muscles on second bout for improved set up.  Increased range of motion  Sitting on dynadisk: Lateral weight shift R and L 2x 30 sec  Forward/posterior weight shift x 30  30 seconds rest break between bouts. Improved body awareness and deep core activation on Second bout of lateral weight shift    PATIENT EDUCATION: Education details:  Pt educated throughout session about proper posture and technique with exercises. Improved exercise technique, movement at target joints, use of target muscles after min to mod verbal, visual, tactile cues   Person educated: Patient and Child(ren) Education method: Explanation Education comprehension: verbalized understanding  HOME EXERCISE PROGRAM:  Access Code: [email or web address] URL:  [email or web address] Date: 08/16/2022 Prepared by: Grier Rocher  Exercises - Seated Calf Stretch with Strap  - 1 x daily - 7 x weekly - 3 sets - 10 reps - Seated Calf Stretch knee bent with Strap  - 1 x daily - 7 x weekly - 3 sets - 10 reps - Standing Hip Flexor Stretch  - 1 x daily - 7 x weekly - 3 sets - 10 reps  Access Code: [email or web address] URL: [email or web address] Date: 07/14/2022 Prepared by: Tomasa Hose  Exercises - Supine Bridge  - 1 x daily - 7 x weekly - 3 sets - 10 reps - Clamshell  - 1 x daily - 7 x weekly - 3 sets - 10 reps - Sidelying Hip Abduction  - 1 x daily - 7 x weekly - 3 sets - 10 reps - Supine Posterior Pelvic Tilt  - 1 x daily - 7 x weekly - 3 sets - 10 reps - Sidelying Reverse Clamshell  - 1 x daily - 7 x weekly - 3 sets - 10 reps - Active Straight Leg Raise with Quad Set  - 1 x daily - 7 x weekly - 3 sets - 10 reps  GOALS: Goals reviewed with patient? Yes  SHORT TERM GOALS: Target date: 11/01/2022    Pt will be independent with HEP in order to demonstrate increased ability to perform tasks related to occupation/hobbies. Baseline: Pt to be given HEP at next visit Goal status: IN PROGRESS  LONG TERM GOALS: Target date: 12/27/2022    1.  Patient (> 16 years old) will complete five times sit to stand test in < 10 seconds indicating an increased LE strength and improved balance. Baseline: 34.89 sec. 4/16 no UE support 10.36 11/15/2022: 10.93 sec Goal status: UPDATED  2.  Patient will increase FOTO score to equal to or greater than 54 to demonstrate statistically significant improvement in mobility and quality of life.  Baseline: FOTO: 44 4/16. 46  11/15/2022: 47 Goal status: IN PROGRESS   3.  Patient will increase Berg Balance score by >45 points to demonstrate decreased fall risk during functional activities. Baseline: BERG: 37  08/12/2022: 39.  4/16 44/56 11/15/2022 44/56 Goal status: updated.    4.  Patient will reduce timed  up and go to <11 seconds to reduce fall risk and demonstrate improved transfer/gait ability. Baseline: 27.56 sec 08/12/2022: 12.9 with no AD  4/16: 15.83  11/15/2022 15.66 with Rollator Goal status: IN PROGRESS  5.  Patient will increase 10 meter walk test to >1.20m/s as to improve gait speed for better community ambulation and to reduce fall risk. Baseline: 32.77 sec. 4.16 13.05  sec no UE support   11/15/2022: 10.72sec.  0.93 m/s with rollator  Goal status: IN PROGRESS  6.  Patient will increase six minute walk test distance to >1000 for progression to community ambulator and improve gait ability Baseline: 23' with use of rollator 08/12/2022: 820'. 4/16 89ft  Goal status: IN PROGRESS   ASSESSMENT:  CLINICAL IMPRESSION:  Patient put forth excellent effort towards all therapeutic activity on this day.  PT treatment focused on functional gait training.  Improved movement patterns to increase step length decreased step width, and improve postural Muscle Activation.  Patient demonstrating improved gait pattern with increased repetitions as well as improved confidence with decreased upper extremity support throughout session.  The patient will benefit from continued strength endurance and balance training as well as continued skilled therapy to address remaining deficits in order to improve overall QoL and return to PLOF.       OBJECTIVE IMPAIRMENTS: Abnormal gait, decreased activity tolerance, decreased balance, decreased endurance, decreased knowledge of use of DME, decreased mobility, difficulty walking, and decreased strength.   ACTIVITY LIMITATIONS: carrying, lifting, standing, squatting, stairs, transfers, bed mobility, continence, bathing, toileting, dressing, and locomotion level  PARTICIPATION LIMITATIONS: cleaning, laundry, driving, shopping, community activity, yard work, and church  PERSONAL FACTORS: Age, Fitness, Past/current experiences, and Time since onset of  injury/illness/exacerbation are also affecting patient's functional outcome.   REHAB POTENTIAL: Good  CLINICAL DECISION MAKING: Stable/uncomplicated  EVALUATION COMPLEXITY: Low  PLAN:  PT FREQUENCY: 2x/week  PT DURATION: 12 weeks  PLANNED INTERVENTIONS: Therapeutic exercises, Therapeutic activity, Neuromuscular re-education, Balance training, Gait training, Patient/Family education, Self Care, Joint mobilization, Stair training, Vestibular training, Canalith repositioning, DME instructions, Aquatic Therapy, Dry Needling, Cryotherapy, Moist heat, Taping, Manual therapy, and Re-evaluation   PLAN FOR NEXT SESSION:    Adjust HEP as needed.  Deep core activation and postural stability. Continue to progress BLE strengthening program;   Grier Rocher PT, DPT  Physical Therapist - [place]  [facility]  10:52 AM 11/16/22     Note: Portions of this document were prepared using Dragon voice recognition software and although reviewed may contain unintentional dictation errors in syntax, grammar, or spelling.

## 2022-11-21 ENCOUNTER — Other Ambulatory Visit: Payer: Self-pay | Admitting: Internal Medicine

## 2022-11-22 ENCOUNTER — Ambulatory Visit: Payer: Medicare PPO | Admitting: Physical Therapy

## 2022-11-22 DIAGNOSIS — R262 Difficulty in walking, not elsewhere classified: Secondary | ICD-10-CM

## 2022-11-22 DIAGNOSIS — R29898 Other symptoms and signs involving the musculoskeletal system: Secondary | ICD-10-CM

## 2022-11-22 DIAGNOSIS — M6281 Muscle weakness (generalized): Secondary | ICD-10-CM

## 2022-11-22 NOTE — Therapy (Signed)
OUTPATIENT PHYSICAL THERAPY NEURO TREATMENT    Patient Name: MARICARMEN SHALA MRN: [id number] DOB:03-19-1931, 89 y.o., female Today's Date: 11/22/2022   PCP: Dale Emhouse, MD REFERRING PROVIDER: Louretta Shorten, MD  END OF SESSION:  PT End of Session - 11/22/22 1229     Visit Number 32    Number of Visits 46    Date for PT Re-Evaluation 12/27/22    Progress Note Due on Visit 40    PT Start Time 1148    PT Stop Time 1229    PT Time Calculation (min) 41 min    Equipment Utilized During Treatment Gait belt    Activity Tolerance Patient tolerated treatment well    Behavior During Therapy WFL for tasks assessed/performed                  Past Medical History:  Diagnosis Date   Chemotherapy induced nausea and vomiting    Chicken pox    Esophageal cancer (HCC)    Hypercalcemia    familial hypocalciuric hypercalcemia   Hypercholesterolemia    Hypertension    Lung cancer (HCC)    Osteoporosis    Thyroid disease    Past Surgical History:  Procedure Laterality Date   ABDOMINAL HYSTERECTOMY     partial   CHOLECYSTECTOMY     COLONOSCOPY WITH PROPOFOL N/A 04/17/2017   Procedure: COLONOSCOPY WITH PROPOFOL;  Surgeon: Scot Jun, MD;  Location: [facility] ENDOSCOPY;  Service: Endoscopy;  Laterality: N/A;   ESOPHAGOGASTRODUODENOSCOPY (EGD) WITH PROPOFOL N/A 04/13/2021   Procedure: ESOPHAGOGASTRODUODENOSCOPY (EGD) WITH PROPOFOL;  Surgeon: Midge Minium, MD;  Location: [facility] ENDOSCOPY;  Service: Endoscopy;  Laterality: N/A;   EUS N/A 04/22/2021   Procedure: FULL UPPER ENDOSCOPIC ULTRASOUND (EUS) RADIAL;  Surgeon: Rayann Heman, MD;  Location: [facility] ENDOSCOPY;  Service: Endoscopy;  Laterality: N/A;  [facility] needed   EUS N/A 10/28/2021   Procedure: UPPER ENDOSCOPIC ULTRASOUND (EUS) LINEAR;  Surgeon: Doren Custard, MD;  Location: [facility] ENDOSCOPY;  Service: Gastroenterology;  Laterality: N/A;  [facility]   transvaginal hysterectomy  04/18/05   with anterior colporrhaphy    Patient Active Problem List   Diagnosis Date Noted   Encounter for immunotherapy 04/26/2022   Low TSH level 11/23/2021   PAD (peripheral artery disease) (HCC) 08/28/2021   Emphysema of lung (HCC) 08/28/2021   Esophageal cancer (HCC) 05/30/2021   Diarrhea 05/30/2021   Neoplasm of middle third of esophagus 05/10/2021   Stricture and stenosis of esophagus    Dysphagia 01/22/2021   Type 2 diabetes mellitus with hyperglycemia (HCC) 01/19/2021   COVID-19 virus infection 06/21/2020   Anemia 05/11/2020   Malignant neoplasm of right upper lobe of lung (HCC) 08/07/2019   URI (upper respiratory infection) 09/16/2016   Health care maintenance 05/18/2016   History of lung cancer 02/08/2016   Abdominal aortic aneurysm (HCC) 08/20/2015   Abnormal CXR 03/06/2014   Cough 03/06/2014   Headache(784.0) 03/06/2014   Hyperglycemia 02/07/2013   Hypertension 06/26/2012   Hypercholesterolemia 06/26/2012   Osteoporosis 06/26/2012   Hypercalcemia 06/26/2012   GERD (gastroesophageal reflux disease) 06/26/2012    ONSET DATE: 10/2021  REFERRING DIAG: M62.81 (ICD-10-CM) - Muscle weakness (generalized)   THERAPY DIAG:  Muscle weakness (generalized)  Difficulty in walking, not elsewhere classified  Weakness of both hips  Rationale for Evaluation and Treatment: Rehabilitation  SUBJECTIVE:  SUBJECTIVE STATEMENT:  Patient reports that she is doing well. Pt reports that she may miss PT appointments next week due to daughter going out of town.    Pt accompanied by: self,  PERTINENT HISTORY:  Pt developed has history of cancer x2, but is in remission.  Pt has been on prednisone for 3 months, but should be off it in the next few days.  MD feels as though that has contributed to her weakness.  Pt has caregiver that  comes over 3x week that helps washing clothes due to having some things on the ground floor.  PAIN:  Are you having pain? No  PRECAUTIONS: Other: Cancer  WEIGHT BEARING RESTRICTIONS: No  FALLS: Has patient fallen in last 6 months? No  LIVING ENVIRONMENT: Lives with: lives alone Lives in: House/apartment Stairs: Yes: Internal: 13 steps; can reach both Has following equipment at home: Single point cane, Walker - 4 wheeled, shower chair, and Grab bars  PLOF: Independent and needing some assistance with driving.  PATIENT GOALS: To get stronger and stop utilizing the walker as much.  OBJECTIVE:   DIAGNOSTIC FINDINGS:   EXAM: NUCLEAR MEDICINE PET SKULL BASE TO THIGH  IMPRESSION: 1. No scintigraphic evidence of residual or recurrent esophageal hypermetabolism. 2. No evidence of hypermetabolic metastatic disease. 3. Similar aneurysmal dilation of the infrarenal abdominal aorta measuring 4.6 cm. Recommend follow-up CT/MR every 6 months and vascular consultation. This recommendation follows ACR consensus guidelines: White Paper of the ACR Incidental Findings Committee II on Vascular Findings. J Am Coll Radiol 2013; 57:846-962. 4. Aortic Atherosclerosis (ICD10-I70.0) and Emphysema (ICD10-J43.9).  COGNITION: Overall cognitive status: Within functional limits for tasks assessed   SENSATION: WFL  COORDINATION: WFL  POSTURE: No Significant postural limitations   LOWER EXTREMITY MMT:    MMT Right Eval Left Eval 4/16 R 4/16  L   Hip flexion 4 4 4 4   Hip abduction 4 4 4+ 4+  Hip adduction 4- 4- 4+ 4+  Knee flexion 4- 4- 4 4  Knee extension 4- 4- 4 4  Ankle dorsiflexion 4- 4- 4 4  (Blank rows = not tested)  FUNCTIONAL TESTS:  5 times sit to stand: 34.89 sec 4/16: with UE support 11.44; UE support on thighs: 11.43; no UE support 10.36   Timed up and go (TUG): 27.56 sec 4/16: 15.83  6 minute walk test: 25' with use of rollator 4/16: 85ft with rollator 10 meter walk  test: 32.77 sec 4/16 13.05 sec no UE support   Berg Balance Scale: 37/56: 4/16 44/56  PATIENT SURVEYS:  FOTO 44 4/16: 46   TODAY'S TREATMENT: DATE: 11/22/22  Nustep level 2-6 x 5.5 min. Cues for consistent SPM through variable resistance at >55 as tolerated.   Sit<>stand 3x10 with no UE support. Pt required min assist to complete last 2 reps on third set due to BLE fatigue.   Step up to 6inch step 2 x 5 bil with BUE support    BLE circuit training 4# ankle weight  LAQ x 10 x2  Hip flexion x10 x 2  Hip abduction x10 x 2 GTB HS curl GTB 2 x 10  Sit<>stand  x 10  x 2  Gait with 4# ankle weight 2x 146ft UE support on rollator.   Min cues for full ROM and improved step length on the LLE as well as increased step height on LLE to prevent foot drag on the L side. Cues for improved hold at end range and decreased Eccentric control.  Therapeutic rest break required following each bout of therex due to BLE.   PATIENT EDUCATION: Education details:  Pt educated throughout session about proper posture and technique with exercises. Improved exercise technique, movement at target joints, use of target muscles after min to mod verbal, visual, tactile cues   Person educated: Patient and Child(ren) Education method: Explanation Education comprehension: verbalized understanding  HOME EXERCISE PROGRAM:  Access Code: [email or web address] URL: [email or web address] Date: 08/16/2022 Prepared by: Grier Rocher  Exercises - Seated Calf Stretch with Strap  - 1 x daily - 7 x weekly - 3 sets - 10 reps - Seated Calf Stretch knee bent with Strap  - 1 x daily - 7 x weekly - 3 sets - 10 reps - Standing Hip Flexor Stretch  - 1 x daily - 7 x weekly - 3 sets - 10 reps  Access Code: [email or web address] URL: [email or web address] Date: 07/14/2022 Prepared by: Tomasa Hose  Exercises - Supine Bridge  - 1 x daily - 7 x weekly - 3 sets - 10 reps - Clamshell  - 1 x daily - 7 x weekly - 3 sets - 10  reps - Sidelying Hip Abduction  - 1 x daily - 7 x weekly - 3 sets - 10 reps - Supine Posterior Pelvic Tilt  - 1 x daily - 7 x weekly - 3 sets - 10 reps - Sidelying Reverse Clamshell  - 1 x daily - 7 x weekly - 3 sets - 10 reps - Active Straight Leg Raise with Quad Set  - 1 x daily - 7 x weekly - 3 sets - 10 reps  GOALS: Goals reviewed with patient? Yes  SHORT TERM GOALS: Target date: 11/01/2022    Pt will be independent with HEP in order to demonstrate increased ability to perform tasks related to occupation/hobbies. Baseline: Pt to be given HEP at next visit Goal status: IN PROGRESS  LONG TERM GOALS: Target date: 12/27/2022    1.  Patient (> 60 years old) will complete five times sit to stand test in < 10 seconds indicating an increased LE strength and improved balance. Baseline: 34.89 sec. 4/16 no UE support 10.36 11/15/2022: 10.93 sec Goal status: UPDATED  2.  Patient will increase FOTO score to equal to or greater than 54 to demonstrate statistically significant improvement in mobility and quality of life.  Baseline: FOTO: 44 4/16. 46  11/15/2022: 47 Goal status: IN PROGRESS   3.  Patient will increase Berg Balance score by >45 points to demonstrate decreased fall risk during functional activities. Baseline: BERG: 37  08/12/2022: 39.  4/16 44/56 11/15/2022 44/56 Goal status: updated.    4.  Patient will reduce timed up and go to <11 seconds to reduce fall risk and demonstrate improved transfer/gait ability. Baseline: 27.56 sec 08/12/2022: 12.9 with no AD  4/16: 15.83  11/15/2022 15.66 with Rollator Goal status: IN PROGRESS  5.  Patient will increase 10 meter walk test to >1.76m/s as to improve gait speed for better community ambulation and to reduce fall risk. Baseline: 32.77 sec. 4.16 13.05 sec no UE support   11/15/2022: 10.72sec.  0.93 m/s with rollator  Goal status: IN PROGRESS  6.  Patient will increase six minute walk test distance to >1000 for progression to community  ambulator and improve gait ability Baseline: 33' with use of rollator 08/12/2022: 820'. 4/16 819ft  Goal status: IN PROGRESS   ASSESSMENT:  CLINICAL IMPRESSION:  Patient put forth excellent effort towards all therapeutic activity on this day. PT  treatment focused on improved BLE strength and endurance through circuit training. Min cues for improved technique of therex to improve muscle activation and improve control of eccentric movement. The patient will benefit from continued strength endurance and balance training as well as continued skilled therapy to address remaining deficits in order to improve overall QoL and return to PLOF.       OBJECTIVE IMPAIRMENTS: Abnormal gait, decreased activity tolerance, decreased balance, decreased endurance, decreased knowledge of use of DME, decreased mobility, difficulty walking, and decreased strength.   ACTIVITY LIMITATIONS: carrying, lifting, standing, squatting, stairs, transfers, bed mobility, continence, bathing, toileting, dressing, and locomotion level  PARTICIPATION LIMITATIONS: cleaning, laundry, driving, shopping, community activity, yard work, and church  PERSONAL FACTORS: Age, Fitness, Past/current experiences, and Time since onset of injury/illness/exacerbation are also affecting patient's functional outcome.   REHAB POTENTIAL: Good  CLINICAL DECISION MAKING: Stable/uncomplicated  EVALUATION COMPLEXITY: Low  PLAN:  PT FREQUENCY: 2x/week  PT DURATION: 12 weeks  PLANNED INTERVENTIONS: Therapeutic exercises, Therapeutic activity, Neuromuscular re-education, Balance training, Gait training, Patient/Family education, Self Care, Joint mobilization, Stair training, Vestibular training, Canalith repositioning, DME instructions, Aquatic Therapy, Dry Needling, Cryotherapy, Moist heat, Taping, Manual therapy, and Re-evaluation   PLAN FOR NEXT SESSION:    Adjust HEP as needed.  Deep core activation and postural stability. Continue to  progress BLE strengthening program;   Grier Rocher PT, DPT  Physical Therapist - [facility]  [facility]  2:12 PM 11/22/22

## 2022-11-24 ENCOUNTER — Encounter: Payer: Self-pay | Admitting: Physical Therapy

## 2022-11-24 ENCOUNTER — Ambulatory Visit: Payer: Medicare PPO | Admitting: Physical Therapy

## 2022-11-24 DIAGNOSIS — M6281 Muscle weakness (generalized): Secondary | ICD-10-CM | POA: Diagnosis not present

## 2022-11-24 DIAGNOSIS — R29898 Other symptoms and signs involving the musculoskeletal system: Secondary | ICD-10-CM | POA: Diagnosis not present

## 2022-11-24 DIAGNOSIS — R262 Difficulty in walking, not elsewhere classified: Secondary | ICD-10-CM

## 2022-11-24 NOTE — Therapy (Signed)
OUTPATIENT PHYSICAL THERAPY NEURO TREATMENT    Patient Name: Heather Cooper MRN: [id number] DOB:1930/10/22, 89 y.o., female Today's Date: 11/24/2022   PCP: Dale Malvern, MD REFERRING PROVIDER: Louretta Shorten, MD  END OF SESSION:  PT End of Session - 11/24/22 1213     Visit Number 33    Number of Visits 46    Date for PT Re-Evaluation 12/27/22    Progress Note Due on Visit 40    PT Start Time 1146    PT Stop Time 1227    PT Time Calculation (min) 41 min    Equipment Utilized During Treatment Gait belt    Activity Tolerance Patient tolerated treatment well    Behavior During Therapy WFL for tasks assessed/performed                  Past Medical History:  Diagnosis Date   Chemotherapy induced nausea and vomiting    Chicken pox    Esophageal cancer (HCC)    Hypercalcemia    familial hypocalciuric hypercalcemia   Hypercholesterolemia    Hypertension    Lung cancer (HCC)    Osteoporosis    Thyroid disease    Past Surgical History:  Procedure Laterality Date   ABDOMINAL HYSTERECTOMY     partial   CHOLECYSTECTOMY     COLONOSCOPY WITH PROPOFOL N/A 04/17/2017   Procedure: COLONOSCOPY WITH PROPOFOL;  Surgeon: Scot Jun, MD;  Location[facility] ENDOSCOPY;  Service: Endoscopy;  Laterality: N/A;   ESOPHAGOGASTRODUODENOSCOPY (EGD) WITH PROPOFOL N/A 04/13/2021   Procedure: ESOPHAGOGASTRODUODENOSCOPY (EGD) WITH PROPOFOL;  Surgeon: Midge Minium, MD;  Location: [facility] ENDOSCOPY;  Service: Endoscopy;  Laterality: N/A;   EUS N/A 04/22/2021   Procedure: FULL UPPER ENDOSCOPIC ULTRASOUND (EUS) RADIAL;  Surgeon: Rayann Heman, MD;  Location: [facility] ENDOSCOPY;  Service: Endoscopy;  Laterality: N/A;  [facility] needed   EUS N/A 10/28/2021   Procedure: UPPER ENDOSCOPIC ULTRASOUND (EUS) LINEAR;  Surgeon: Doren Custard, MD;  Location: [facility] ENDOSCOPY;  Service: Gastroenterology;  Laterality: N/A;  [facility]   transvaginal hysterectomy  04/18/05   with anterior colporrhaphy    Patient Active Problem List   Diagnosis Date Noted   Encounter for immunotherapy 04/26/2022   Low TSH level 11/23/2021   PAD (peripheral artery disease) (HCC) 08/28/2021   Emphysema of lung (HCC) 08/28/2021   Esophageal cancer (HCC) 05/30/2021   Diarrhea 05/30/2021   Neoplasm of middle third of esophagus 05/10/2021   Stricture and stenosis of esophagus    Dysphagia 01/22/2021   Type 2 diabetes mellitus with hyperglycemia (HCC) 01/19/2021   COVID-19 virus infection 06/21/2020   Anemia 05/11/2020   Malignant neoplasm of right upper lobe of lung (HCC) 08/07/2019   URI (upper respiratory infection) 09/16/2016   Health care maintenance 05/18/2016   History of lung cancer 02/08/2016   Abdominal aortic aneurysm (HCC) 08/20/2015   Abnormal CXR 03/06/2014   Cough 03/06/2014   Headache(784.0) 03/06/2014   Hyperglycemia 02/07/2013   Hypertension 06/26/2012   Hypercholesterolemia 06/26/2012   Osteoporosis 06/26/2012   Hypercalcemia 06/26/2012   GERD (gastroesophageal reflux disease) 06/26/2012    ONSET DATE: 10/2021  REFERRING DIAG: M62.81 (ICD-10-CM) - Muscle weakness (generalized)   THERAPY DIAG:  Muscle weakness (generalized)  Difficulty in walking, not elsewhere classified  Weakness of both hips  Rationale for Evaluation and Treatment: Rehabilitation  SUBJECTIVE:  SUBJECTIVE STATEMENT:  Patient reports that she is doing well. Pt received Cortizone shot yesterday to coccyx with mild reduction in tail bone pain.    Pt accompanied by: self,  PERTINENT HISTORY:  Pt developed has history of cancer x2, but is in remission.  Pt has been on prednisone for 3 months, but should be off it in the next few days.  MD feels as though that has contributed to her weakness.  Pt has caregiver that comes  over 3x week that helps washing clothes due to having some things on the ground floor.  PAIN:  Are you having pain? No  PRECAUTIONS: Other: Cancer  WEIGHT BEARING RESTRICTIONS: No  FALLS: Has patient fallen in last 6 months? No  LIVING ENVIRONMENT: Lives with: lives alone Lives in: House/apartment Stairs: Yes: Internal: 13 steps; can reach both Has following equipment at home: Single point cane, Walker - 4 wheeled, shower chair, and Grab bars  PLOF: Independent and needing some assistance with driving.  PATIENT GOALS: To get stronger and stop utilizing the walker as much.  OBJECTIVE:   DIAGNOSTIC FINDINGS:   EXAM: NUCLEAR MEDICINE PET SKULL BASE TO THIGH  IMPRESSION: 1. No scintigraphic evidence of residual or recurrent esophageal hypermetabolism. 2. No evidence of hypermetabolic metastatic disease. 3. Similar aneurysmal dilation of the infrarenal abdominal aorta measuring 4.6 cm. Recommend follow-up CT/MR every 6 months and vascular consultation. This recommendation follows ACR consensus guidelines: White Paper of the ACR Incidental Findings Committee II on Vascular Findings. J Am Coll Radiol 2013; 16:109-604. 4. Aortic Atherosclerosis (ICD10-I70.0) and Emphysema (ICD10-J43.9).  COGNITION: Overall cognitive status: Within functional limits for tasks assessed   SENSATION: WFL  COORDINATION: WFL  POSTURE: No Significant postural limitations   LOWER EXTREMITY MMT:    MMT Right Eval Left Eval 4/16 R 4/16  L   Hip flexion 4 4 4 4   Hip abduction 4 4 4+ 4+  Hip adduction 4- 4- 4+ 4+  Knee flexion 4- 4- 4 4  Knee extension 4- 4- 4 4  Ankle dorsiflexion 4- 4- 4 4  (Blank rows = not tested)  FUNCTIONAL TESTS:  5 times sit to stand: 34.89 sec 4/16: with UE support 11.44; UE support on thighs: 11.43; no UE support 10.36   Timed up and go (TUG): 27.56 sec 4/16: 15.83  6 minute walk test: 27' with use of rollator 4/16: 884ft with rollator 10 meter walk test:  32.77 sec 4/16 13.05 sec no UE support   Berg Balance Scale: 37/56: 4/16 44/56  PATIENT SURVEYS:  FOTO 44 4/16: 46   TODAY'S TREATMENT: DATE: 11/24/22  Nustep level 2-5 x 5.5 min. Cues for consistent SPM through variable resistance at >55 as tolerated.   Blaze pods;  Random x 2 min x 2 bout. Pt able to tolerate 1:73min on first bout and full 2 min on seconds bout   Focus x 25 hits x 2 bout, 55sec and 53sec. Pt hit one distractor on second bout.   PT instructed pt in HEP; UE supported at rail at wall Squat x 12  Hip abduction x 12  Reciprocal march x 10 2 sec hold Tandem stance 4 x 15sec hold.   Cues for decreased UE support as able with HE as well full ROm and hold at end range to improve motor recruitment.     PATIENT EDUCATION: Education details:  Pt educated throughout session about proper posture and technique with exercises. Improved exercise technique, movement at target joints, use of target muscles  after min to mod verbal, visual, tactile cues   Person educated: Patient and Child(ren) Education method: Explanation Education comprehension: verbalized understanding  HOME EXERCISE PROGRAM:  Access Code: [email or web address] URL: [email or web address] Date: 11/24/2022 Prepared by: Grier Rocher  Exercises - Mini Squat with Counter Support  - 1 x daily - 7 x weekly - 3 sets - 12 reps - Standing March with Counter Support  - 1 x daily - 5 x weekly - 3 sets - 10 reps - 2 hold - Standing Hip Abduction with Counter Support  - 1 x daily - 5 x weekly - 3 sets - 10 reps - 2 hold - Standing Tandem Balance with Counter Support  - 1 x daily - 5 x weekly - 3 sets - 4 reps - 15 hold  Access Code: [email or web address] URL: [email or web address] Date: 08/16/2022 Prepared by: Grier Rocher  Exercises - Seated Calf Stretch with Strap  - 1 x daily - 7 x weekly - 3 sets - 10 reps - Seated Calf Stretch knee bent with Strap  - 1 x daily - 7 x weekly - 3 sets - 10 reps -  Standing Hip Flexor Stretch  - 1 x daily - 7 x weekly - 3 sets - 10 reps  Access Code: [email or web address] URL: [email or web address] Date: 07/14/2022 Prepared by: Tomasa Hose  Exercises - Supine Bridge  - 1 x daily - 7 x weekly - 3 sets - 10 reps - Clamshell  - 1 x daily - 7 x weekly - 3 sets - 10 reps - Sidelying Hip Abduction  - 1 x daily - 7 x weekly - 3 sets - 10 reps - Supine Posterior Pelvic Tilt  - 1 x daily - 7 x weekly - 3 sets - 10 reps - Sidelying Reverse Clamshell  - 1 x daily - 7 x weekly - 3 sets - 10 reps - Active Straight Leg Raise with Quad Set  - 1 x daily - 7 x weekly - 3 sets - 10 reps  GOALS: Goals reviewed with patient? Yes  SHORT TERM GOALS: Target date: 11/01/2022    Pt will be independent with HEP in order to demonstrate increased ability to perform tasks related to occupation/hobbies. Baseline: Pt to be given HEP at next visit Goal status: IN PROGRESS  LONG TERM GOALS: Target date: 12/27/2022    1.  Patient (> 69 years old) will complete five times sit to stand test in < 10 seconds indicating an increased LE strength and improved balance. Baseline: 34.89 sec. 4/16 no UE support 10.36 11/15/2022: 10.93 sec Goal status: UPDATED  2.  Patient will increase FOTO score to equal to or greater than 54 to demonstrate statistically significant improvement in mobility and quality of life.  Baseline: FOTO: 44 4/16. 46  11/15/2022: 47 Goal status: IN PROGRESS   3.  Patient will increase Berg Balance score by >45 points to demonstrate decreased fall risk during functional activities. Baseline: BERG: 37  08/12/2022: 39.  4/16 44/56 11/15/2022 44/56 Goal status: updated.    4.  Patient will reduce timed up and go to <11 seconds to reduce fall risk and demonstrate improved transfer/gait ability. Baseline: 27.56 sec 08/12/2022: 12.9 with no AD  4/16: 15.83  11/15/2022 15.66 with Rollator Goal status: IN PROGRESS  5.  Patient will increase 10 meter walk test to  >1.27m/s as to improve gait speed for better community ambulation and to reduce fall risk. Baseline: 32.77 sec. 4.16 13.05 sec no UE support   11/15/2022:  10.72sec.  0.93 m/s with rollator  Goal status: IN PROGRESS  6.  Patient will increase six minute walk test distance to >1000 for progression to community ambulator and improve gait ability Baseline: 24' with use of rollator 08/12/2022: 820'. 4/16 882ft  Goal status: IN PROGRESS   ASSESSMENT:  CLINICAL IMPRESSION:  Patient put forth excellent effort towards all therapeutic activity on this day. HEP adjusted to include standing therex with UE support. Required  verbal instruction for proper technique to maximize strengthening aspect of movement. The Blaze Pod Random setting was chosen to enhance cognitive processing and agility, providing an unpredictable environment to simulate real-world scenarios, and fostering quick reactions and adaptability. The [facility] Focus setting was selected to refine precision and concentration, isolating specific muscle groups or movements to enhance overall coordination and targeted muscle engagement. The patient will benefit from continued strength endurance and balance training as well as continued skilled therapy to address remaining deficits in order to improve overall QoL and return to PLOF.       OBJECTIVE IMPAIRMENTS: Abnormal gait, decreased activity tolerance, decreased balance, decreased endurance, decreased knowledge of use of DME, decreased mobility, difficulty walking, and decreased strength.   ACTIVITY LIMITATIONS: carrying, lifting, standing, squatting, stairs, transfers, bed mobility, continence, bathing, toileting, dressing, and locomotion level  PARTICIPATION LIMITATIONS: cleaning, laundry, driving, shopping, community activity, yard work, and church  PERSONAL FACTORS: Age, Fitness, Past/current experiences, and Time since onset of injury/illness/exacerbation are also affecting patient's  functional outcome.   REHAB POTENTIAL: Good  CLINICAL DECISION MAKING: Stable/uncomplicated  EVALUATION COMPLEXITY: Low  PLAN:  PT FREQUENCY: 2x/week  PT DURATION: 12 weeks  PLANNED INTERVENTIONS: Therapeutic exercises, Therapeutic activity, Neuromuscular re-education, Balance training, Gait training, Patient/Family education, Self Care, Joint mobilization, Stair training, Vestibular training, Canalith repositioning, DME instructions, Aquatic Therapy, Dry Needling, Cryotherapy, Moist heat, Taping, Manual therapy, and Re-evaluation   PLAN FOR NEXT SESSION:     Deep core activation and postural stability. Continue to progress BLE strengthening program;   Grier Rocher PT, DPT  Physical Therapist - [facility]  [facility]  1:11 PM 11/24/22

## 2022-12-01 DIAGNOSIS — H353131 Nonexudative age-related macular degeneration, bilateral, early dry stage: Secondary | ICD-10-CM | POA: Diagnosis not present

## 2022-12-01 DIAGNOSIS — Z961 Presence of intraocular lens: Secondary | ICD-10-CM | POA: Diagnosis not present

## 2022-12-01 DIAGNOSIS — H40023 Open angle with borderline findings, high risk, bilateral: Secondary | ICD-10-CM | POA: Diagnosis not present

## 2022-12-01 DIAGNOSIS — H4301 Vitreous prolapse, right eye: Secondary | ICD-10-CM | POA: Diagnosis not present

## 2022-12-06 ENCOUNTER — Ambulatory Visit: Payer: Medicare PPO | Admitting: Physical Therapy

## 2022-12-08 ENCOUNTER — Encounter: Payer: Self-pay | Admitting: Physical Therapy

## 2022-12-08 ENCOUNTER — Ambulatory Visit: Payer: Medicare PPO | Admitting: Physical Therapy

## 2022-12-08 DIAGNOSIS — M6281 Muscle weakness (generalized): Secondary | ICD-10-CM

## 2022-12-08 DIAGNOSIS — R262 Difficulty in walking, not elsewhere classified: Secondary | ICD-10-CM

## 2022-12-08 DIAGNOSIS — R29898 Other symptoms and signs involving the musculoskeletal system: Secondary | ICD-10-CM

## 2022-12-08 NOTE — Therapy (Signed)
OUTPATIENT PHYSICAL THERAPY NEURO TREATMENT    Patient Name: Heather Cooper MRN: [id number] DOB:Aug 23, 1930, 89 y.o., female Today's Date: 12/08/2022   PCP: Dale Dunkirk, MD REFERRING PROVIDER: Louretta Shorten, MD  END OF SESSION:  PT End of Session - 12/08/22 0857     Visit Number 34    Number of Visits 46    Date for PT Re-Evaluation 12/27/22    Progress Note Due on Visit 40    PT Start Time 0846    PT Stop Time 0930    PT Time Calculation (min) 44 min    Equipment Utilized During Treatment Gait belt    Activity Tolerance Patient tolerated treatment well    Behavior During Therapy WFL for tasks assessed/performed                   Past Medical History:  Diagnosis Date   Chemotherapy induced nausea and vomiting    Chicken pox    Esophageal cancer (HCC)    Hypercalcemia    familial hypocalciuric hypercalcemia   Hypercholesterolemia    Hypertension    Lung cancer (HCC)    Osteoporosis    Thyroid disease    Past Surgical History:  Procedure Laterality Date   ABDOMINAL HYSTERECTOMY     partial   CHOLECYSTECTOMY     COLONOSCOPY WITH PROPOFOL N/A 04/17/2017   Procedure: COLONOSCOPY WITH PROPOFOL;  Surgeon: Scot Jun, MD;  Location: [facility] ENDOSCOPY;  Service: Endoscopy;  Laterality: N/A;   ESOPHAGOGASTRODUODENOSCOPY (EGD) WITH PROPOFOL N/A 04/13/2021   Procedure: ESOPHAGOGASTRODUODENOSCOPY (EGD) WITH PROPOFOL;  Surgeon: Midge Minium, MD;  Location: [facility] ENDOSCOPY;  Service: Endoscopy;  Laterality: N/A;   EUS N/A 04/22/2021   Procedure: FULL UPPER ENDOSCOPIC ULTRASOUND (EUS) RADIAL;  Surgeon: Rayann Heman, MD;  Location: [facility] ENDOSCOPY;  Service: Endoscopy;  Laterality: N/A;  [facility] needed   EUS N/A 10/28/2021   Procedure: UPPER ENDOSCOPIC ULTRASOUND (EUS) LINEAR;  Surgeon: Doren Custard, MD;  Location: [facility] ENDOSCOPY;  Service: Gastroenterology;  Laterality: N/A;  [facility]   transvaginal hysterectomy  04/18/05   with anterior colporrhaphy    Patient Active Problem List   Diagnosis Date Noted   Encounter for immunotherapy 04/26/2022   Low TSH level 11/23/2021   PAD (peripheral artery disease) (HCC) 08/28/2021   Emphysema of lung (HCC) 08/28/2021   Esophageal cancer (HCC) 05/30/2021   Diarrhea 05/30/2021   Neoplasm of middle third of esophagus 05/10/2021   Stricture and stenosis of esophagus    Dysphagia 01/22/2021   Type 2 diabetes mellitus with hyperglycemia (HCC) 01/19/2021   COVID-19 virus infection 06/21/2020   Anemia 05/11/2020   Malignant neoplasm of right upper lobe of lung (HCC) 08/07/2019   URI (upper respiratory infection) 09/16/2016   Health care maintenance 05/18/2016   History of lung cancer 02/08/2016   Abdominal aortic aneurysm (HCC) 08/20/2015   Abnormal CXR 03/06/2014   Cough 03/06/2014   Headache(784.0) 03/06/2014   Hyperglycemia 02/07/2013   Hypertension 06/26/2012   Hypercholesterolemia 06/26/2012   Osteoporosis 06/26/2012   Hypercalcemia 06/26/2012   GERD (gastroesophageal reflux disease) 06/26/2012    ONSET DATE: 10/2021  REFERRING DIAG: M62.81 (ICD-10-CM) - Muscle weakness (generalized)   THERAPY DIAG:  Muscle weakness (generalized)  Difficulty in walking, not elsewhere classified  Weakness of both hips  Rationale for Evaluation and Treatment: Rehabilitation  SUBJECTIVE:  SUBJECTIVE STATEMENT: Patient reports that she is doing well. Had a nose bleed this morning, but otherwise no significant changes since last session.   Pt accompanied by: self,  PERTINENT HISTORY:  Pt developed has history of cancer x2, but is in remission.  Pt has been on prednisone for 3 months, but should be off it in the next few days.  MD feels as though that has contributed to her weakness.  Pt has caregiver that comes  over 3x week that helps washing clothes due to having some things on the ground floor.  PAIN:  Are you having pain? No  PRECAUTIONS: Other: Cancer  WEIGHT BEARING RESTRICTIONS: No  FALLS: Has patient fallen in last 6 months? No  LIVING ENVIRONMENT: Lives with: lives alone Lives in: House/apartment Stairs: Yes: Internal: 13 steps; can reach both Has following equipment at home: Single point cane, Walker - 4 wheeled, shower chair, and Grab bars  PLOF: Independent and needing some assistance with driving.  PATIENT GOALS: To get stronger and stop utilizing the walker as much.  OBJECTIVE:   DIAGNOSTIC FINDINGS:   EXAM: NUCLEAR MEDICINE PET SKULL BASE TO THIGH  IMPRESSION: 1. No scintigraphic evidence of residual or recurrent esophageal hypermetabolism. 2. No evidence of hypermetabolic metastatic disease. 3. Similar aneurysmal dilation of the infrarenal abdominal aorta measuring 4.6 cm. Recommend follow-up CT/MR every 6 months and vascular consultation. This recommendation follows ACR consensus guidelines: White Paper of the ACR Incidental Findings Committee II on Vascular Findings. J Am Coll Radiol 2013; 78:[id number]. 4. Aortic Atherosclerosis (ICD10-I70.0) and Emphysema (ICD10-J43.9).  COGNITION: Overall cognitive status: Within functional limits for tasks assessed   SENSATION: WFL  COORDINATION: WFL  POSTURE: No Significant postural limitations   LOWER EXTREMITY MMT:    MMT Right Eval Left Eval 4/16 R 4/16  L   Hip flexion 4 4 4 4   Hip abduction 4 4 4+ 4+  Hip adduction 4- 4- 4+ 4+  Knee flexion 4- 4- 4 4  Knee extension 4- 4- 4 4  Ankle dorsiflexion 4- 4- 4 4  (Blank rows = not tested)  FUNCTIONAL TESTS:  5 times sit to stand: 34.89 sec 4/16: with UE support 11.44; UE support on thighs: 11.43; no UE support 10.36   Timed up and go (TUG): 27.56 sec 4/16: 15.83  6 minute walk test: 55' with use of rollator 4/16: 868ft with rollator 10 meter walk test:  32.77 sec 4/16 13.05 sec no UE support   Berg Balance Scale: 37/56: 4/16 44/56  PATIENT SURVEYS:  FOTO 44 4/16: 46   TODAY'S TREATMENT: DATE: 12/08/22  BP assess at beginning of session, seated: 141/80.  TE: Nustep level 2-4 x 5 min. Cues for consistent SPM through variable resistance at >55 as tolerated.  4# AW donned for: Standing SLS holds 10 x ea side,  Standing hip ABD 12 x ea side -cues for slow and controlled movement to increase core and hip stability to improve balance.  Activity Description: Standing foot taps without UE support. Working on overall unsupported standing duration with ability to weight shift while utilizing core. Activity Setting: [facility] Focus setting was selected to refine precision and concentration, isolating specific muscle groups or movements to enhance overall coordination and targeted muscle engagement.  Number of Pods:  4 Cycles/Sets:  2 set Duration (Time or Hit Count):  1 min 22 sec for 47 hits, then 1 min 38 sec for 57 hits.   NMR: Airex pad static stranding 1 x 25 sec  Airex pad standing with NBOS, 2 x 30 sec   Blaze pod activity: Activity Description: Alternating stair taps with blaze pods providing external focus, Cues given to minimize UE support. Slowing motions down to work on brief dynamic SLS stance Activity Setting:  The Blaze Pod Sequence setting was selected to enhance cognitive function and motor skills, providing a structured environment to follow a specific pattern and improve memory, coordination, and movement sequences.   Number of Pods:  2 Cycles/Sets:  2 Duration (Time or Hit Count):  10 hits x each leg  Balance activities focusing utilization of central/deep core muscles.   PATIENT EDUCATION: Education details:  Pt educated throughout session about proper posture and technique with exercises. Improved exercise technique, movement at target joints, use of target muscles after min to mod verbal, visual, tactile cues    Person educated: Patient and Child(ren) Education method: Explanation Education comprehension: verbalized understanding  HOME EXERCISE PROGRAM:  Access Code: [email or web address] URL: [email or web address] Date: 11/24/2022 Prepared by: Grier Rocher  Exercises - Mini Squat with Counter Support  - 1 x daily - 7 x weekly - 3 sets - 12 reps - Standing March with Counter Support  - 1 x daily - 5 x weekly - 3 sets - 10 reps - 2 hold - Standing Hip Abduction with Counter Support  - 1 x daily - 5 x weekly - 3 sets - 10 reps - 2 hold - Standing Tandem Balance with Counter Support  - 1 x daily - 5 x weekly - 3 sets - 4 reps - 15 hold  Access Code: [email or web address] URL: [email or web address] Date: 08/16/2022 Prepared by: Grier Rocher  Exercises - Seated Calf Stretch with Strap  - 1 x daily - 7 x weekly - 3 sets - 10 reps - Seated Calf Stretch knee bent with Strap  - 1 x daily - 7 x weekly - 3 sets - 10 reps - Standing Hip Flexor Stretch  - 1 x daily - 7 x weekly - 3 sets - 10 reps  Access Code: [email or web address] URL: [email or web address] Date: 07/14/2022 Prepared by: Tomasa Hose  Exercises - Supine Bridge  - 1 x daily - 7 x weekly - 3 sets - 10 reps - Clamshell  - 1 x daily - 7 x weekly - 3 sets - 10 reps - Sidelying Hip Abduction  - 1 x daily - 7 x weekly - 3 sets - 10 reps - Supine Posterior Pelvic Tilt  - 1 x daily - 7 x weekly - 3 sets - 10 reps - Sidelying Reverse Clamshell  - 1 x daily - 7 x weekly - 3 sets - 10 reps - Active Straight Leg Raise with Quad Set  - 1 x daily - 7 x weekly - 3 sets - 10 reps  GOALS: Goals reviewed with patient? Yes  SHORT TERM GOALS: Target date: 11/01/2022    Pt will be independent with HEP in order to demonstrate increased ability to perform tasks related to occupation/hobbies. Baseline: Pt to be given HEP at next visit Goal status: IN PROGRESS  LONG TERM GOALS: Target date: 12/27/2022    1.  Patient (> 33 years old)  will complete five times sit to stand test in < 10 seconds indicating an increased LE strength and improved balance. Baseline: 34.89 sec. 4/16 no UE support 10.36 11/15/2022: 10.93 sec Goal status: UPDATED  2.  Patient will increase FOTO score to equal to or greater than 54 to demonstrate statistically significant improvement in mobility and  quality of life.  Baseline: FOTO: 44 4/16. 46  11/15/2022: 47 Goal status: IN PROGRESS   3.  Patient will increase Berg Balance score by >45 points to demonstrate decreased fall risk during functional activities. Baseline: BERG: 37  08/12/2022: 39.  4/16 44/56 11/15/2022 44/56 Goal status: updated.    4.  Patient will reduce timed up and go to <11 seconds to reduce fall risk and demonstrate improved transfer/gait ability. Baseline: 27.56 sec 08/12/2022: 12.9 with no AD  4/16: 15.83  11/15/2022 15.66 with Rollator Goal status: IN PROGRESS  5.  Patient will increase 10 meter walk test to >1.32m/s as to improve gait speed for better community ambulation and to reduce fall risk. Baseline: 32.77 sec. 4.16 13.05 sec no UE support   11/15/2022: 10.72sec.  0.93 m/s with rollator  Goal status: IN PROGRESS  6.  Patient will increase six minute walk test distance to >1000 for progression to community ambulator and improve gait ability Baseline: 24' with use of rollator 08/12/2022: 820'. 4/16 875ft  Goal status: IN PROGRESS   ASSESSMENT:  CLINICAL IMPRESSION: Patient put forth excellent effort towards all therapeutic activity on this day. Worked on increasing duration and functional strength today. The Blaze Pod Random setting was chosen to enhance cognitive processing and agility, providing an unpredictable environment to simulate real-world scenarios, and fostering quick reactions and adaptability. The [facility] Focus setting was selected to refine precision and concentration, isolating specific muscle groups or movements to enhance overall coordination and targeted  muscle engagement. Throughout session, pt only requiring CGA at most, typically only needing supervision.  With cues for increased step length and heel contact throughout. The patient will benefit from continued strength endurance and balance training as well as continued skilled therapy to address remaining deficits in order to improve overall QoL and return to PLOF.    OBJECTIVE IMPAIRMENTS: Abnormal gait, decreased activity tolerance, decreased balance, decreased endurance, decreased knowledge of use of DME, decreased mobility, difficulty walking, and decreased strength.   ACTIVITY LIMITATIONS: carrying, lifting, standing, squatting, stairs, transfers, bed mobility, continence, bathing, toileting, dressing, and locomotion level  PARTICIPATION LIMITATIONS: cleaning, laundry, driving, shopping, community activity, yard work, and church  PERSONAL FACTORS: Age, Fitness, Past/current experiences, and Time since onset of injury/illness/exacerbation are also affecting patient's functional outcome.   REHAB POTENTIAL: Good  CLINICAL DECISION MAKING: Stable/uncomplicated  EVALUATION COMPLEXITY: Low  PLAN:  PT FREQUENCY: 2x/week  PT DURATION: 12 weeks  PLANNED INTERVENTIONS: Therapeutic exercises, Therapeutic activity, Neuromuscular re-education, Balance training, Gait training, Patient/Family education, Self Care, Joint mobilization, Stair training, Vestibular training, Canalith repositioning, DME instructions, Aquatic Therapy, Dry Needling, Cryotherapy, Moist heat, Taping, Manual therapy, and Re-evaluation   PLAN FOR NEXT SESSION:   Complete D/c Assessment and provide HEP    Jenay Morici Ritch, SPT   9:55 AM 12/08/22    I have read and reviewed the attached note and am in agreement with the documentation provided.     This licensed clinician was present and actively directing care throughout the session at all times.   Grier Rocher PT, DPT  Physical Therapist - [place]  [place]  [facility]  3:00 PM 12/08/22

## 2022-12-13 ENCOUNTER — Ambulatory Visit: Payer: Medicare PPO | Attending: Internal Medicine | Admitting: Physical Therapy

## 2022-12-13 DIAGNOSIS — R29898 Other symptoms and signs involving the musculoskeletal system: Secondary | ICD-10-CM | POA: Insufficient documentation

## 2022-12-13 DIAGNOSIS — M6281 Muscle weakness (generalized): Secondary | ICD-10-CM | POA: Diagnosis not present

## 2022-12-13 DIAGNOSIS — R262 Difficulty in walking, not elsewhere classified: Secondary | ICD-10-CM | POA: Insufficient documentation

## 2022-12-13 NOTE — Therapy (Unsigned)
OUTPATIENT PHYSICAL THERAPY NEURO TREATMENT/ Discharge Summary     Patient Name: Heather Cooper MRN: [id number] DOB:1931-03-10, 89 y.o., female Today's Date: 12/13/2022   PCP: Dale Lee Mont, MD REFERRING PROVIDER: Louretta Shorten, MD  END OF SESSION:  PT End of Session - 12/13/22 1104     Visit Number 35    Number of Visits 46    Date for PT Re-Evaluation 12/27/22    Progress Note Due on Visit 40    PT Start Time 1103    PT Stop Time 1147    PT Time Calculation (min) 44 min    Equipment Utilized During Treatment Gait belt    Activity Tolerance Patient tolerated treatment well    Behavior During Therapy WFL for tasks assessed/performed                   Past Medical History:  Diagnosis Date   Chemotherapy induced nausea and vomiting    Chicken pox    Esophageal cancer (HCC)    Hypercalcemia    familial hypocalciuric hypercalcemia   Hypercholesterolemia    Hypertension    Lung cancer (HCC)    Osteoporosis    Thyroid disease    Past Surgical History:  Procedure Laterality Date   ABDOMINAL HYSTERECTOMY     partial   CHOLECYSTECTOMY     COLONOSCOPY WITH PROPOFOL N/A 04/17/2017   Procedure: COLONOSCOPY WITH PROPOFOL;  Surgeon: Scot Jun, MD;  Location: [facility] ENDOSCOPY;  Service: Endoscopy;  Laterality: N/A;   ESOPHAGOGASTRODUODENOSCOPY (EGD) WITH PROPOFOL N/A 04/13/2021   Procedure: ESOPHAGOGASTRODUODENOSCOPY (EGD) WITH PROPOFOL;  Surgeon: Midge Minium, MD;  Location: [facility] ENDOSCOPY;  Service: Endoscopy;  Laterality: N/A;   EUS N/A 04/22/2021   Procedure: FULL UPPER ENDOSCOPIC ULTRASOUND (EUS) RADIAL;  Surgeon: Rayann Heman, MD;  Location: [facility] ENDOSCOPY;  Service: Endoscopy;  Laterality: N/A;  [facility] needed   EUS N/A 10/28/2021   Procedure: UPPER ENDOSCOPIC ULTRASOUND (EUS) LINEAR;  Surgeon: Doren Custard, MD;  Location: [facility] ENDOSCOPY;  Service: Gastroenterology;  Laterality: N/A;  [facility]   transvaginal hysterectomy  04/18/05   with  anterior colporrhaphy   Patient Active Problem List   Diagnosis Date Noted   Encounter for immunotherapy 04/26/2022   Low TSH level 11/23/2021   PAD (peripheral artery disease) (HCC) 08/28/2021   Emphysema of lung (HCC) 08/28/2021   Esophageal cancer (HCC) 05/30/2021   Diarrhea 05/30/2021   Neoplasm of middle third of esophagus 05/10/2021   Stricture and stenosis of esophagus    Dysphagia 01/22/2021   Type 2 diabetes mellitus with hyperglycemia (HCC) 01/19/2021   COVID-19 virus infection 06/21/2020   Anemia 05/11/2020   Malignant neoplasm of right upper lobe of lung (HCC) 08/07/2019   URI (upper respiratory infection) 09/16/2016   Health care maintenance 05/18/2016   History of lung cancer 02/08/2016   Abdominal aortic aneurysm (HCC) 08/20/2015   Abnormal CXR 03/06/2014   Cough 03/06/2014   Headache(784.0) 03/06/2014   Hyperglycemia 02/07/2013   Hypertension 06/26/2012   Hypercholesterolemia 06/26/2012   Osteoporosis 06/26/2012   Hypercalcemia 06/26/2012   GERD (gastroesophageal reflux disease) 06/26/2012    ONSET DATE: 10/2021  REFERRING DIAG: M62.81 (ICD-10-CM) - Muscle weakness (generalized)   THERAPY DIAG:  Muscle weakness (generalized)  Difficulty in walking, not elsewhere classified  Weakness of both hips  Rationale for Evaluation and Treatment: Rehabilitation  SUBJECTIVE:  SUBJECTIVE STATEMENT: Patient reports that she is doing well. Ready to be discharged from PT services.   Pt accompanied by: self,  PERTINENT HISTORY:  Pt developed has history of cancer x2, but is in remission.  Pt has been on prednisone for 3 months, but should be off it in the next few days.  MD feels as though that has contributed to her weakness.  Pt has caregiver that comes over 3x week that helps  washing clothes due to having some things on the ground floor.  PAIN:  Are you having pain? No  PRECAUTIONS: Other: Cancer  WEIGHT BEARING RESTRICTIONS: No  FALLS: Has patient fallen in last 6 months? No  LIVING ENVIRONMENT: Lives with: lives alone Lives in: House/apartment Stairs: Yes: Internal: 13 steps; can reach both Has following equipment at home: Single point cane, Walker - 4 wheeled, shower chair, and Grab bars  PLOF: Independent and needing some assistance with driving.  PATIENT GOALS: To get stronger and stop utilizing the walker as much.  OBJECTIVE:   DIAGNOSTIC FINDINGS:   EXAM: NUCLEAR MEDICINE PET SKULL BASE TO THIGH  IMPRESSION: 1. No scintigraphic evidence of residual or recurrent esophageal hypermetabolism. 2. No evidence of hypermetabolic metastatic disease. 3. Similar aneurysmal dilation of the infrarenal abdominal aorta measuring 4.6 cm. Recommend follow-up CT/MR every 6 months and vascular consultation. This recommendation follows ACR consensus guidelines: White Paper of the ACR Incidental Findings Committee II on Vascular Findings. J Am Coll Radiol 2013; 16:109-604. 4. Aortic Atherosclerosis (ICD10-I70.0) and Emphysema (ICD10-J43.9).  COGNITION: Overall cognitive status: Within functional limits for tasks assessed   SENSATION: WFL  COORDINATION: WFL  POSTURE: No Significant postural limitations   LOWER EXTREMITY MMT:    MMT Right Eval Left Eval R 7/2 L 7/2  Hip flexion 4 4 4 4   Hip abduction 4 4 4+ 4+  Hip adduction 4- 4- 4+ 4+  Knee flexion 4- 4- 4 4  Knee extension 4- 4- 4+ 4+  Ankle dorsiflexion 4- 4- 4 4  (Blank rows = not tested)  FUNCTIONAL TESTS:  See below for details in Goal assessment   PATIENT SURVEYS:  FOTO 44 4/16: 46   TODAY'S TREATMENT: DATE: 12/13/22  PT instructed pt in discharge assessment to measure progress toward Long term goals. See below for details.    PATIENT EDUCATION: Education details:  Pt  educated throughout session about proper posture and technique with exercises. Improved exercise technique, movement at target joints, use of target muscles after min to mod verbal, visual, tactile cues   Person educated: Patient and Child(ren) Education method: Explanation Education comprehension: verbalized understanding  HOME EXERCISE PROGRAM:  Access Code: [email or web address] URL: [email or web address] Date: 11/24/2022 Prepared by: Grier Rocher  Exercises - Mini Squat with Counter Support  - 1 x daily - 7 x weekly - 3 sets - 12 reps - Standing March with Counter Support  - 1 x daily - 5 x weekly - 3 sets - 10 reps - 2 hold - Standing Hip Abduction with Counter Support  - 1 x daily - 5 x weekly - 3 sets - 10 reps - 2 hold - Standing Tandem Balance with Counter Support  - 1 x daily - 5 x weekly - 3 sets - 4 reps - 15 hold  Access Code: [email or web address] URL: [email or web address] Date: 08/16/2022 Prepared by: Grier Rocher  Exercises - Seated Calf Stretch with Strap  - 1 x daily - 7 x weekly - 3 sets - 10 reps - Seated Calf Stretch knee  bent with Strap  - 1 x daily - 7 x weekly - 3 sets - 10 reps - Standing Hip Flexor Stretch  - 1 x daily - 7 x weekly - 3 sets - 10 reps  Access Code: [email or web address] URL: [email or web address] Date: 07/14/2022 Prepared by: Tomasa Hose  Exercises - Supine Bridge  - 1 x daily - 7 x weekly - 3 sets - 10 reps - Clamshell  - 1 x daily - 7 x weekly - 3 sets - 10 reps - Sidelying Hip Abduction  - 1 x daily - 7 x weekly - 3 sets - 10 reps - Supine Posterior Pelvic Tilt  - 1 x daily - 7 x weekly - 3 sets - 10 reps - Sidelying Reverse Clamshell  - 1 x daily - 7 x weekly - 3 sets - 10 reps - Active Straight Leg Raise with Quad Set  - 1 x daily - 7 x weekly - 3 sets - 10 reps  GOALS: Goals reviewed with patient? Yes  SHORT TERM GOALS: Target date: 11/01/2022    Pt will be independent with HEP in order to demonstrate increased  ability to perform tasks related to occupation/hobbies. Baseline: Pt to be given HEP at next visit Goal status: IN PROGRESS  LONG TERM GOALS: Target date: 12/27/2022    1.  Patient (> 41 years old) will complete five times sit to stand test in < 10 seconds indicating an increased LE strength and improved balance. Baseline: 34.89 sec. 4/16 no UE support 10.36  11/15/2022: 10.93 sec 7/2: 8.8 sec  Goal status: MET  2.  Patient will increase FOTO score to equal to or greater than 54 to demonstrate statistically significant improvement in mobility and quality of life.  Baseline: FOTO: 44  4/16. 46  11/15/2022: 47 7/2: 49.2 Goal status: Not met.    3.  Patient will increase Berg Balance score by >45 points to demonstrate decreased fall risk during functional activities. Baseline: BERG: 37  08/12/2022: 39.  4/16 44/56 11/15/2022 44/56 7/2: 45/56 Goal status: MET    4.  Patient will reduce timed up and go to <11 seconds to reduce fall risk and demonstrate improved transfer/gait ability. Baseline: 27.56 sec 08/12/2022: 12.9 with no AD  4/16: 15.83  11/15/2022 15.66 with Rollator 7/2: 16.1 with rollator; 12.6 without AD  Goal status: Not Met   5.  Patient will increase 10 meter walk test to >1.45m/s as to improve gait speed for better community ambulation and to reduce fall risk. Baseline: 32.77 sec. 4.16 13.05 sec no UE support   11/15/2022: 10.72sec.  0.93 m/s with rollator 7/2:  9.3 sec 1.7m/s  Goal status: MET  6.  Patient will increase six minute walk test distance to >1000 for progression to community ambulator and improve gait ability Baseline: 45' with use of rollator 08/12/2022: 820'. 4/16 820ft  7/2: 1037ft   Goal status: MET   ASSESSMENT:  CLINICAL IMPRESSION: Patient put forth excellent effort towards all therapeutic activity on this day. Pt has met 4 of 6 goals with noted improvement in 5xSTS, Gait speed, TUG, and 6 min walk test as listed above to indicate reduced fall risk and  improved safety with community access. Pt and family in agreement that she is ready to d/c form PT services at this time. Pt's daughter and pt feel comfortable and confident with management of HEP and use of community services to maintain strength and balance gains.       OBJECTIVE IMPAIRMENTS:  Abnormal gait, decreased activity tolerance, decreased balance, decreased endurance, decreased knowledge of use of DME, decreased mobility, difficulty walking, and decreased strength.   ACTIVITY LIMITATIONS: carrying, lifting, standing, squatting, stairs, transfers, bed mobility, continence, bathing, toileting, dressing, and locomotion level  PARTICIPATION LIMITATIONS: cleaning, laundry, driving, shopping, community activity, yard work, and church  PERSONAL FACTORS: Age, Fitness, Past/current experiences, and Time since onset of injury/illness/exacerbation are also affecting patient's functional outcome.   REHAB POTENTIAL: Good  CLINICAL DECISION MAKING: Stable/uncomplicated  EVALUATION COMPLEXITY: Low  PLAN:  PT FREQUENCY: 2x/week  PT DURATION: 12 weeks  PLANNED INTERVENTIONS: Therapeutic exercises, Therapeutic activity, Neuromuscular re-education, Balance training, Gait training, Patient/Family education, Self Care, Joint mobilization, Stair training, Vestibular training, Canalith repositioning, DME instructions, Aquatic Therapy, Dry Needling, Cryotherapy, Moist heat, Taping, Manual therapy, and Re-evaluation   PLAN FOR NEXT SESSION:   D/C from PT services.    Grier Rocher PT, DPT  Physical Therapist - [facility]  11:48 AM 12/13/22

## 2022-12-19 ENCOUNTER — Encounter: Payer: Self-pay | Admitting: Radiation Oncology

## 2022-12-19 ENCOUNTER — Ambulatory Visit
Admission: RE | Admit: 2022-12-19 | Discharge: 2022-12-19 | Disposition: A | Discharge status: Home / Self Care | Payer: Medicare PPO | Source: Ambulatory Visit | Attending: Radiation Oncology | Admitting: Radiation Oncology

## 2022-12-19 VITALS — BP 132/74 | HR 82 | Temp 95.7°F | Resp 14 | Ht 63.0 in | Wt 132.0 lb

## 2022-12-19 DIAGNOSIS — Z923 Personal history of irradiation: Secondary | ICD-10-CM | POA: Diagnosis not present

## 2022-12-19 DIAGNOSIS — C159 Malignant neoplasm of esophagus, unspecified: Secondary | ICD-10-CM | POA: Diagnosis not present

## 2022-12-19 DIAGNOSIS — Z8501 Personal history of malignant neoplasm of esophagus: Secondary | ICD-10-CM | POA: Insufficient documentation

## 2022-12-19 DIAGNOSIS — C154 Malignant neoplasm of middle third of esophagus: Secondary | ICD-10-CM

## 2022-12-19 DIAGNOSIS — Z87891 Personal history of nicotine dependence: Secondary | ICD-10-CM | POA: Diagnosis not present

## 2022-12-19 NOTE — Progress Notes (Signed)
Radiation Oncology Follow up Note  Name: Heather Cooper   Date:   12/19/2022 MRN:  [id number] DOB: 06-19-1930    This 89 y.o. female presents to the clinic today for 60-month follow-up status post IMRT radiation therapy to her midesophagus for moderately differentiated squamous cell carcinoma in a patient treated for stage IV adenocarcinoma lung back in 2015.  REFERRING PROVIDER: Dale Bodcaw, MD  HPI: Patient is an 63 soon-to-be 89 year old female now out 17 months having completed radiation therapy to her midesophagus for moderately differentiated squamous cell carcinoma.  Seen today in routine follow-up she is doing well specifically [place] any dysphagia her weight has stabilized she is returned to normal weight.  She is without complaint..  She had a PET CT scan back in March showing no of hypermetabolic recurrent or metastatic esophageal cancer  COMPLICATIONS OF TREATMENT: none  FOLLOW UP COMPLIANCE: keeps appointments   PHYSICAL EXAM:  BP 132/74   Pulse 82   Temp (!) 95.7 F (35.4 C)   Resp 14   Ht 5\' 3"  (1.6 m)   Wt 132 lb (59.9 kg)   BMI 23.38 kg/m  Well-developed well-nourished patient in NAD. HEENT reveals PERLA, EOMI, discs not visualized.  Oral cavity is clear. No oral mucosal lesions are identified. Neck is clear without evidence of cervical or supraclavicular adenopathy. Lungs are clear to A&P. Cardiac examination is essentially unremarkable with regular rate and rhythm without murmur rub or thrill. Abdomen is benign with no organomegaly or masses noted. Motor sensory and DTR levels are equal and symmetric in the upper and lower extremities. Cranial nerves II through XII are grossly intact. Proprioception is intact. No peripheral adenopathy or edema is identified. No motor or sensory levels are noted. Crude visual fields are within normal range.  RADIOLOGY RESULTS: PET scan reviewed compatible with above-stated findings  PLAN: Present time patient is now at 17 months for  esophageal squamous cell carcinoma with no evidence of disease.  And pleased with her overall progress.  She is turning 89 this week.  I will turn follow-up care over to medical oncology.  Be happy to reevaluate her anytime should that be indicated.  Patient is to call with any concerns.  I would like to take this opportunity to thank you for allowing me to participate in the care of your patient.Carmina Miller, MD

## 2022-12-20 ENCOUNTER — Encounter: Payer: Self-pay | Admitting: Nurse Practitioner

## 2022-12-20 ENCOUNTER — Inpatient Hospital Stay: Payer: Medicare PPO | Attending: Internal Medicine

## 2022-12-20 ENCOUNTER — Ambulatory Visit: Payer: Medicare PPO

## 2022-12-20 ENCOUNTER — Inpatient Hospital Stay (HOSPITAL_BASED_OUTPATIENT_CLINIC_OR_DEPARTMENT_OTHER): Payer: Medicare PPO | Admitting: Nurse Practitioner

## 2022-12-20 VITALS — BP 115/72 | HR 92 | Temp 96.2°F | Wt 130.4 lb

## 2022-12-20 DIAGNOSIS — M8008XA Age-related osteoporosis with current pathological fracture, vertebra(e), initial encounter for fracture: Secondary | ICD-10-CM | POA: Insufficient documentation

## 2022-12-20 DIAGNOSIS — Z8501 Personal history of malignant neoplasm of esophagus: Secondary | ICD-10-CM

## 2022-12-20 DIAGNOSIS — C3411 Malignant neoplasm of upper lobe, right bronchus or lung: Secondary | ICD-10-CM | POA: Diagnosis not present

## 2022-12-20 DIAGNOSIS — Z08 Encounter for follow-up examination after completed treatment for malignant neoplasm: Secondary | ICD-10-CM

## 2022-12-20 DIAGNOSIS — D490 Neoplasm of unspecified behavior of digestive system: Secondary | ICD-10-CM

## 2022-12-20 DIAGNOSIS — Z87891 Personal history of nicotine dependence: Secondary | ICD-10-CM | POA: Insufficient documentation

## 2022-12-20 DIAGNOSIS — D649 Anemia, unspecified: Secondary | ICD-10-CM | POA: Insufficient documentation

## 2022-12-20 DIAGNOSIS — C154 Malignant neoplasm of middle third of esophagus: Secondary | ICD-10-CM | POA: Insufficient documentation

## 2022-12-20 LAB — CBC WITH DIFFERENTIAL (CANCER CENTER ONLY)
Abs Immature Granulocytes: 0.03 10*3/uL (ref 0.00–0.07)
Basophils Absolute: 0.1 10*3/uL (ref 0.0–0.1)
Basophils Relative: 1 %
Eosinophils Absolute: 0.2 10*3/uL (ref 0.0–0.5)
Eosinophils Relative: 2 %
HCT: 36.3 % (ref 36.0–46.0)
Hemoglobin: 12.3 g/dL (ref 12.0–15.0)
Immature Granulocytes: 0 %
Lymphocytes Relative: 30 %
Lymphs Abs: 2.9 10*3/uL (ref 0.7–4.0)
MCH: 30.1 pg (ref 26.0–34.0)
MCHC: 33.9 g/dL (ref 30.0–36.0)
MCV: 89 fL (ref 80.0–100.0)
Monocytes Absolute: 0.7 10*3/uL (ref 0.1–1.0)
Monocytes Relative: 7 %
Neutro Abs: 5.6 10*3/uL (ref 1.7–7.7)
Neutrophils Relative %: 60 %
Platelet Count: 258 10*3/uL (ref 150–400)
RBC: 4.08 MIL/uL (ref 3.87–5.11)
RDW: 12.8 % (ref 11.5–15.5)
WBC Count: 9.5 10*3/uL (ref 4.0–10.5)
nRBC: 0 % (ref 0.0–0.2)

## 2022-12-20 LAB — CMP (CANCER CENTER ONLY)
ALT: 11 U/L (ref 0–44)
AST: 16 U/L (ref 15–41)
Albumin: 4.3 g/dL (ref 3.5–5.0)
Alkaline Phosphatase: 74 U/L (ref 38–126)
Anion gap: 9 (ref 5–15)
BUN: 23 mg/dL (ref 8–23)
CO2: 27 mmol/L (ref 22–32)
Calcium: 10.1 mg/dL (ref 8.9–10.3)
Chloride: 101 mmol/L (ref 98–111)
Creatinine: 0.97 mg/dL (ref 0.44–1.00)
GFR, Estimated: 56 mL/min — ABNORMAL LOW (ref >60)
Glucose, Bld: 95 mg/dL (ref 70–99)
Potassium: 4.4 mmol/L (ref 3.5–5.1)
Sodium: 137 mmol/L (ref 135–145)
Total Bilirubin: 0.5 mg/dL (ref 0.3–1.2)
Total Protein: 7.9 g/dL (ref 6.5–8.1)

## 2022-12-20 LAB — FERRITIN: Ferritin: 72 ng/mL (ref 11–307)

## 2022-12-20 LAB — IRON AND TIBC
Iron: 127 ug/dL (ref 28–170)
Saturation Ratios: 33 % — ABNORMAL HIGH (ref 10.4–31.8)
TIBC: 381 ug/dL (ref 250–450)
UIBC: 254 ug/dL

## 2022-12-20 NOTE — Progress Notes (Signed)
[facility] OFFICE PROGRESS NOTE  Patient Care Team: Dale [place], MD as PCP - General (Internal Medicine) Benita Gutter, RN as Oncology Nurse Navigator Earna Coder, MD as Consulting Physician (Oncology) Midge Minium, MD as Consulting Physician (Gastroenterology) Borders, Daryl Eastern, NP as Nurse Practitioner (Hospice and Palliative Medicine) Keitha Butte, RN as Registered Nurse (Oncology)   Cancer Staging  Neoplasm of middle third of esophagus Staging form: Esophagus - Squamous Cell Carcinoma, AJCC 8th Edition - Clinical: Stage Unknown (cTX, cN1, cM0) - Signed by Earna Coder, MD on 05/10/2021 - Pathologic: No stage assigned - Unsigned   Oncology History Overview Note  # 2015-patient is an 89 year old female with 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  Starting radiation and chemotherapy from April 14, 2014 Patient was started on carboplatinum andTaxol Herve were developed an allergic reaction to Taxol so would be changed to Abraxane 3.patient has finished 6 cycles of weekly chemotherapy with carboplatinum  and radiation therapy(May 28, 2014) 4.started on  NIVOLULAMAB because of persistent disease July 02, 2014. 5.  NIVOLULAMAB was discontinued because of persistent diarrhea in July of 2016.  August of 2016 CT scan was stable so no further chemotherapy  # AUG 25th PET- STABLE RUL MASS [radiation fibrosis; <1cm ? 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   APRIL 16th, 2023- PET scan-  Resolution of metabolic activity in the mid esophagus;  Persistent hypermetabolic  activity within a subcarinal lymph node. Differential includes residual carcinoma versus reactive adenopathy.  No evidence of distant metastatic disease.  # MAY 18th, 2023- s/p EUS [Dr.Spaete]-endoscopic biopsy of the mediastinal lymph node. MAY 18th, 2023- Status post endoscopic ultrasound/biopsy of the mediastinal/subcarinal lymph node- BIOPSY POSITIVE FOR RECURRENT SQUAMOUS CELL.  # JUNE 9th, 2023- Opdivo every 2 weeks.   # AAA/ 3.3x 3.9 Stable [Dr.Schnier] -----------------------------------------------------       History of lung cancer  Malignant neoplasm of right upper lobe of lung (HCC)  08/07/2019 Initial Diagnosis   Malignant neoplasm of right upper lobe of lung (HCC)    INTERVAL HISTORY: Ambulating with a rolling walker.  Accompanied by Gunnar Fusi.   CALYPSO HAGARTY 89 y.o.  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 Patient's Opdivo is currently on HOLD given the concern for autoimmune colitis from [facility].  She is off prednisone. Completed physical therapy. Denies worsening cough or shortness of breath. No diarrhea.   Review of Systems  Constitutional:  Positive for malaise/fatigue. Negative for chills, diaphoresis, fever and weight loss.  HENT:  Negative for nosebleeds and sore throat.   Eyes:  Negative for double vision.  Respiratory:  Negative for hemoptysis, sputum production, shortness of breath and wheezing.   Cardiovascular:  Negative for chest pain, palpitations, orthopnea and leg swelling.  Gastrointestinal:  Negative for abdominal pain, blood in stool, constipation, diarrhea, heartburn, melena, nausea and vomiting.  Genitourinary:  Negative for dysuria, frequency and urgency.  Musculoskeletal:  Negative for back pain and joint pain.  Skin: Negative.  Negative for itching and rash.  Neurological:  Negative  for dizziness, tingling, focal weakness, weakness and  headaches.  Endo/Heme/Allergies:  Does not bruise/bleed easily.  Psychiatric/Behavioral:  Negative for depression. The patient is not nervous/anxious and does not have insomnia.    PAST MEDICAL HISTORY :  Past Medical History:  Diagnosis Date   Chemotherapy induced nausea and vomiting    Chicken pox    Esophageal cancer (HCC)    Hypercalcemia    familial hypocalciuric hypercalcemia   Hypercholesterolemia    Hypertension    Lung cancer (HCC)    Osteoporosis    Thyroid disease     PAST SURGICAL HISTORY :   Past Surgical History:  Procedure Laterality Date   ABDOMINAL HYSTERECTOMY     partial   CHOLECYSTECTOMY     COLONOSCOPY WITH PROPOFOL N/A 04/17/2017   Procedure: COLONOSCOPY WITH PROPOFOL;  Surgeon: Scot Jun, MD;  Location: [facility] ENDOSCOPY;  Service: Endoscopy;  Laterality: N/A;   ESOPHAGOGASTRODUODENOSCOPY (EGD) WITH PROPOFOL N/A 04/13/2021   Procedure: ESOPHAGOGASTRODUODENOSCOPY (EGD) WITH PROPOFOL;  Surgeon: Midge Minium, MD;  Location: [facility] ENDOSCOPY;  Service: Endoscopy;  Laterality: N/A;   EUS N/A 04/22/2021   Procedure: FULL UPPER ENDOSCOPIC ULTRASOUND (EUS) RADIAL;  Surgeon: Rayann Heman, MD;  Location: [facility] ENDOSCOPY;  Service: Endoscopy;  Laterality: N/A;  [facility] needed   EUS N/A 10/28/2021   Procedure: UPPER ENDOSCOPIC ULTRASOUND (EUS) LINEAR;  Surgeon: Doren Custard, MD;  Location: [facility] ENDOSCOPY;  Service: Gastroenterology;  Laterality: N/A;  [facility]   transvaginal hysterectomy  04/18/05   with anterior colporrhaphy    FAMILY HISTORY :   Family History  Problem Relation Age of Onset   Stroke Mother    Hypertension Mother    Prostate cancer Father    Cancer Father        prostate   Heart disease Brother        s/p CABG   Colon cancer Neg Hx     SOCIAL HISTORY:   Social History   Tobacco Use   Smoking status: Former    Types: Cigarettes    Quit date: 06/13/1997    Years since quitting: 25.5   Smokeless tobacco: Never  Vaping Use   Vaping  Use: Never used  Substance Use Topics   Alcohol use: No    Alcohol/week: 0.0 standard drinks of alcohol   Drug use: No    ALLERGIES:  is allergic to paclitaxel.  MEDICATIONS:  Current Outpatient Medications  Medication Sig Dispense Refill   cholecalciferol (VITAMIN D3) 25 MCG (1000 UNIT) tablet Take 1,000 Units by mouth daily.     ferrous sulfate 325 (65 FE) MG EC tablet Take 325 mg by mouth daily with breakfast.     fluticasone (FLONASE) 50 MCG/ACT nasal spray Place 2 sprays into both nostrils daily. 16 g 3   levocetirizine (XYZAL) 5 MG tablet Take 1 tablet (5 mg total) by mouth every evening. 90 tablet 2   olmesartan-hydrochlorothiazide (BENICAR HCT) 20-12.5 MG tablet TAKE 1 TABLET BY MOUTH ONCE DAILY 90 tablet 1   pantoprazole (PROTONIX) 40 MG tablet TAKE 1 TABLET BY MOUTH ONCE DAILY 90 tablet 3   No current facility-administered medications for this visit.   Facility-Administered Medications Ordered in Other Visits  Medication Dose Route Frequency Provider Last Rate Last Admin   0.9 %  sodium chloride infusion   Intravenous Continuous Borders, Daryl Eastern, NP   Stopped at 01/21/22 1247   sodium chloride 0.9 % 1,000 mL with potassium chloride 20 mEq, magnesium sulfate 2 g infusion  Intravenous Continuous Johney Maine, MD   Stopped at 12/04/14 1300    PHYSICAL EXAMINATION: ECOG PERFORMANCE STATUS: 0 - Asymptomatic  BP 115/72 (BP Location: Left Arm, Patient Position: Sitting)   Pulse 92   Temp (!) 96.2 F (35.7 C) (Tympanic)   Wt 130 lb 6.4 oz (59.1 kg)   SpO2 99%   BMI 23.10 kg/m   Filed Weights   12/20/22 1316  Weight: 130 lb 6.4 oz (59.1 kg)   Physical Exam HENT:     Head: Normocephalic and atraumatic.  Cardiovascular:     Rate and Rhythm: Normal rate and regular rhythm.  Pulmonary:     Effort: Pulmonary effort is normal. No respiratory distress.     Breath sounds: Normal breath sounds. No wheezing.  Abdominal:     General: There is no distension.      Palpations: Abdomen is soft. There is no mass.     Tenderness: There is no abdominal tenderness. There is no guarding or rebound.  Musculoskeletal:        General: No tenderness. Normal range of motion.  Skin:    General: Skin is warm.     Coloration: Skin is not pale.  Neurological:     Mental Status: She is alert and oriented to person, place, and time.  Psychiatric:        Mood and Affect: Mood and affect normal.        Behavior: Behavior normal.   LABORATORY DATA:  I have reviewed the data as listed    Component Value Date/Time   NA 137 12/20/2022 1256   NA 130 (L) 10/09/2014 0846   K 4.4 12/20/2022 1256   K 4.1 10/09/2014 0846   CL 101 12/20/2022 1256   CL 98 (L) 10/09/2014 0846   CO2 27 12/20/2022 1256   CO2 25 10/09/2014 0846   GLUCOSE 95 12/20/2022 1256   GLUCOSE 161 (H) 10/09/2014 0846   BUN 23 12/20/2022 1256   BUN 14 10/09/2014 0846   CREATININE 0.97 12/20/2022 1256   CREATININE 0.70 10/09/2014 0846   CALCIUM 10.1 12/20/2022 1256   CALCIUM 9.1 10/09/2014 0846   PROT 7.9 12/20/2022 1256   PROT 7.3 10/09/2014 0846   ALBUMIN 4.3 12/20/2022 1256   ALBUMIN 3.6 10/09/2014 0846   AST 16 12/20/2022 1256   ALT 11 12/20/2022 1256   ALT 13 (L) 10/09/2014 0846   ALKPHOS 74 12/20/2022 1256   ALKPHOS 70 10/09/2014 0846   BILITOT 0.5 12/20/2022 1256   GFRNONAA 56 (L) 12/20/2022 1256   GFRNONAA >60 10/09/2014 0846   GFRAA >60 02/05/2020 1004   GFRAA >60 10/09/2014 0846   Lab Results  Component Value Date   WBC 9.5 12/20/2022   NEUTROABS 5.6 12/20/2022   HGB 12.3 12/20/2022   HCT 36.3 12/20/2022   MCV 89.0 12/20/2022   PLT 258 12/20/2022    RADIOGRAPHIC STUDIES: I have personally reviewed the radiological images as listed and agreed with the findings in the report. No results found.    ASSESSMENT & PLAN:   # Squamous cell carcinoma of the mid-esophagus [NOV 2022]-  locally advanced; RECURRENT [s/p EUS- LB Bx-] MAY 18th, 2023-most recently status post Opdivoq  2 W x4 cycles- [last July 23rd, 2023]; MARCH 27th, 2024- No evidence of hypermetabolic recurrent or metastatic esophageal primary. 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 New tiny right pleural effusion may be radiation induced. Hold EGD. Plan to do PET in 2-3 months. If she develops symptoms or concerning findings on  PET, consider EGD.   # Opdivo held d/t severe diarrhea thought to be immunotherapy related see below). Clinically, stable today.   # Autoimmune colitis- opdivo induced. S/p evaluation with GI, Dr Servando Snare. Off prednisone. No recurrent diarrhea.    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 Stable.    # Mild  Anemia- improved- Hb 12.3 stable; continue gentle iron every other day.     # PBF-BG- 95 -  Monitor for now [Glucerna protein shakes]- continue OFF prednisone as above. Stable.    #Hx of goiter- TSH low-however normal free T3-T4. S/p eval with Dr Evlyn Kanner, [facility] endocrinology. Currently on surveillance.     # Incidental mild compression fracture of L2- Follow up with PCP for osteoporosis management.    # Incidental findings on Imaging  CT , 2024: Atherosclerosis ; infrarenal abdominal aortic aneurysm including at 5.0 x 4.6 cm. Recommend continued follow-up  with Dr.Scheiner  emphysema; reviewed with the patient counseled.   # IV access: PIV.    DISPOSITION:  2-3 months- PET 3 mo- lab (cbc, cmp), [place]- [place]   No problem-specific Assessment & Plan notes found for this encounter.   No orders of the defined types were placed in this encounter.  All questions were answered. The patient knows to call the clinic with any problems, questions or concerns.    Alinda Dooms, NP 12/20/2022

## 2022-12-22 ENCOUNTER — Ambulatory Visit: Payer: Medicare PPO | Admitting: Physical Therapy

## 2022-12-29 ENCOUNTER — Ambulatory Visit: Payer: Medicare PPO | Admitting: Physical Therapy

## 2023-01-03 ENCOUNTER — Ambulatory Visit: Payer: Medicare PPO | Admitting: Physical Therapy

## 2023-01-05 ENCOUNTER — Ambulatory Visit: Payer: Medicare PPO | Admitting: Physical Therapy

## 2023-01-06 ENCOUNTER — Encounter: Payer: Self-pay | Admitting: Internal Medicine

## 2023-01-06 ENCOUNTER — Ambulatory Visit (INDEPENDENT_AMBULATORY_CARE_PROVIDER_SITE_OTHER): Payer: Medicare PPO | Admitting: Internal Medicine

## 2023-01-06 VITALS — BP 126/66 | HR 79 | Temp 97.9°F | Resp 16 | Ht 63.0 in | Wt 133.0 lb

## 2023-01-06 DIAGNOSIS — E7800 Pure hypercholesterolemia, unspecified: Secondary | ICD-10-CM | POA: Diagnosis not present

## 2023-01-06 DIAGNOSIS — I739 Peripheral vascular disease, unspecified: Secondary | ICD-10-CM

## 2023-01-06 DIAGNOSIS — I10 Essential (primary) hypertension: Secondary | ICD-10-CM

## 2023-01-06 DIAGNOSIS — J439 Emphysema, unspecified: Secondary | ICD-10-CM

## 2023-01-06 DIAGNOSIS — Z0000 Encounter for general adult medical examination without abnormal findings: Secondary | ICD-10-CM | POA: Diagnosis not present

## 2023-01-06 DIAGNOSIS — D649 Anemia, unspecified: Secondary | ICD-10-CM

## 2023-01-06 DIAGNOSIS — C3411 Malignant neoplasm of upper lobe, right bronchus or lung: Secondary | ICD-10-CM

## 2023-01-06 DIAGNOSIS — M810 Age-related osteoporosis without current pathological fracture: Secondary | ICD-10-CM

## 2023-01-06 DIAGNOSIS — I714 Abdominal aortic aneurysm, without rupture, unspecified: Secondary | ICD-10-CM | POA: Diagnosis not present

## 2023-01-06 DIAGNOSIS — E1165 Type 2 diabetes mellitus with hyperglycemia: Secondary | ICD-10-CM | POA: Diagnosis not present

## 2023-01-06 DIAGNOSIS — K219 Gastro-esophageal reflux disease without esophagitis: Secondary | ICD-10-CM

## 2023-01-06 DIAGNOSIS — R197 Diarrhea, unspecified: Secondary | ICD-10-CM

## 2023-01-06 DIAGNOSIS — C159 Malignant neoplasm of esophagus, unspecified: Secondary | ICD-10-CM | POA: Diagnosis not present

## 2023-01-06 DIAGNOSIS — R7989 Other specified abnormal findings of blood chemistry: Secondary | ICD-10-CM

## 2023-01-06 DIAGNOSIS — R739 Hyperglycemia, unspecified: Secondary | ICD-10-CM

## 2023-01-06 MED ORDER — NYSTATIN 100000 UNIT/GM EX CREA: 1.0000 | TOPICAL_CREAM | Freq: Two times a day (BID) | CUTANEOUS | 0 refills | Status: DC

## 2023-01-06 NOTE — Assessment & Plan Note (Signed)
Physical today 01/06/23.  Has wanted to hold on bone density and mammogram.

## 2023-01-06 NOTE — Progress Notes (Unsigned)
Subjective:    Patient ID: Heather Cooper, female    DOB: 09/12/1930, 89 y.o.   MRN: [id number]  Patient here for  Chief Complaint  Patient presents with   Annual Exam    HPI Reports she is doing relatively well.  Seeing oncology.  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 Opdivo held - given the concern for autoimmune colitis from [facility].  She is off prednisone. Completed physical therapy for bilateral thigh weakness - steroid myopathy.  Stable. Per report, No evidence of hypermetabolic recurrent or metastatic esophageal primary. 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 New tiny right pleural effusion may be radiation induced. Hold EGD. Plan to do PET in 2-3 months. If she develops symptoms or concerning findings on PET, consider EGD. Also, incidental mild compression fracture of L2.  Discussed with her today. Discussed bone density, osteoporosis and treatment.  She declines.  Will notify me if she changes her mind.  She denies any chest pain.  Breathing is overall stable.  No increased cough or congestion.  No abdominal pain.     Past Medical History:  Diagnosis Date   Chemotherapy induced nausea and vomiting    Chicken pox    Esophageal cancer (HCC)    Hypercalcemia    familial hypocalciuric hypercalcemia   Hypercholesterolemia    Hypertension    Lung cancer (HCC)    Osteoporosis    Thyroid disease    Past Surgical History:  Procedure Laterality Date   ABDOMINAL HYSTERECTOMY     partial   CHOLECYSTECTOMY     COLONOSCOPY WITH PROPOFOL N/A 04/17/2017   Procedure: COLONOSCOPY WITH PROPOFOL;  Surgeon: Scot Jun, MD;  Location[facility] ENDOSCOPY;  Service: Endoscopy;  Laterality: N/A;   ESOPHAGOGASTRODUODENOSCOPY (EGD) WITH PROPOFOL N/A 04/13/2021   Procedure: ESOPHAGOGASTRODUODENOSCOPY (EGD) WITH PROPOFOL;  Surgeon: Midge Minium, MD;  Location: [facility] ENDOSCOPY;   Service: Endoscopy;  Laterality: N/A;   EUS N/A 04/22/2021   Procedure: FULL UPPER ENDOSCOPIC ULTRASOUND (EUS) RADIAL;  Surgeon: Rayann Heman, MD;  Location: [facility] ENDOSCOPY;  Service: Endoscopy;  Laterality: N/A;  [facility] needed   EUS N/A 10/28/2021   Procedure: UPPER ENDOSCOPIC ULTRASOUND (EUS) LINEAR;  Surgeon: Doren Custard, MD;  Location: [facility] ENDOSCOPY;  Service: Gastroenterology;  Laterality: N/A;  [facility]   transvaginal hysterectomy  04/18/05   with anterior colporrhaphy   Family History  Problem Relation Age of Onset   Stroke Mother    Hypertension Mother    Prostate cancer Father    Cancer Father        prostate   Heart disease Brother        s/p CABG   Colon cancer Neg Hx    Social History   Socioeconomic History   Marital status: Widowed    Spouse name: Not on file   Number of children: 3   Years of education: Not on file   Highest education level: Not on file  Occupational History   Not on file  Tobacco Use   Smoking status: Former    Current packs/day: 0.00    Types: Cigarettes    Quit date: 06/13/1997    Years since quitting: 25.5   Smokeless tobacco: Never  Vaping Use   Vaping status: Never Used  Substance and Sexual Activity   Alcohol use: No    Alcohol/week: 0.0 standard drinks of alcohol   Drug use: No   Sexual activity: Not Currently  Other Topics Concern   Not on file  Social History Narrative   Lost her husband November 2019   Social Determinants of Health   Financial Resource Strain: Low Risk  (01/25/2021)   Overall Financial Resource Strain (CARDIA)    Difficulty of Paying Living Expenses: Not hard at all  Food Insecurity: No Food Insecurity (04/11/2022)   Hunger Vital Sign    Worried About Running Out of Food in the Last Year: Never true    Ran Out of Food in the Last Year: Never true  Transportation Needs: No Transportation Needs (04/11/2022)   PRAPARE - Administrator, Civil Service (Medical): No    Lack of  Transportation (Non-Medical): No  Physical Activity: Unknown (01/25/2021)   Exercise Vital Sign    Days of Exercise per Week: 0 days    Minutes of Exercise per Session: Not on file  Stress: No Stress Concern Present (04/11/2022)   [employer] of Occupational Health - Occupational Stress Questionnaire    Feeling of Stress : Not at all  Social Connections: Socially Isolated (04/11/2022)   Social Connection and Isolation Panel [NHANES]    Frequency of Communication with Friends and Family: Never    Frequency of Social Gatherings with Friends and Family: Never    Attends Religious Services: Never    Database administrator or Organizations: No    Attends Banker Meetings: Never    Marital Status: Widowed     Review of Systems  Constitutional:  Negative for appetite change and unexpected weight change.  HENT:  Negative for congestion, sinus pressure and sore throat.   Eyes:  Negative for pain and visual disturbance.  Respiratory:  Negative for cough and chest tightness.        Breathing stable.   Cardiovascular:  Negative for chest pain and palpitations.       No increased swelling.   Gastrointestinal:  Negative for abdominal pain, diarrhea, nausea and vomiting.  Genitourinary:  Negative for difficulty urinating and dysuria.  Musculoskeletal:  Negative for joint swelling and myalgias.  Skin:  Negative for color change and rash.  Neurological:  Negative for dizziness and headaches.  Hematological:  Negative for adenopathy. Does not bruise/bleed easily.  Psychiatric/Behavioral:  Negative for agitation and dysphoric mood.        Objective:     BP 126/66   Pulse 79   Temp 97.9 F (36.6 C)   Resp 16   Ht 5\' 3"  (1.6 m)   Wt 133 lb (60.3 kg)   SpO2 98%   BMI 23.56 kg/m  Wt Readings from Last 3 Encounters:  01/06/23 133 lb (60.3 kg)  12/20/22 130 lb 6.4 oz (59.1 kg)  12/19/22 132 lb (59.9 kg)    Physical Exam Vitals reviewed.  Constitutional:       General: She is not in acute distress.    Appearance: Normal appearance. She is well-developed.  HENT:     Head: Normocephalic and atraumatic.     Right Ear: External ear normal.     Left Ear: External ear normal.  Eyes:     General: No scleral icterus.       Right eye: No discharge.        Left eye: No discharge.     Conjunctiva/sclera: Conjunctivae normal.  Neck:     Thyroid: No thyromegaly.  Cardiovascular:     Rate and Rhythm: Normal rate and regular rhythm.  Pulmonary:     Effort: No tachypnea,  accessory muscle usage or respiratory distress.     Breath sounds: Normal breath sounds. No decreased breath sounds or wheezing.  Chest:  Breasts:    Right: No inverted nipple, mass, nipple discharge or tenderness (no axillary adenopathy).     Left: No inverted nipple, mass, nipple discharge or tenderness (no axilarry adenopathy).  Abdominal:     General: Bowel sounds are normal.     Palpations: Abdomen is soft.     Tenderness: There is no abdominal tenderness.  Musculoskeletal:        General: No swelling or tenderness.     Cervical back: Neck supple.  Lymphadenopathy:     Cervical: No cervical adenopathy.  Skin:    Findings: No erythema or rash.  Neurological:     Mental Status: She is alert and oriented to person, place, and time.  Psychiatric:        Mood and Affect: Mood normal.        Behavior: Behavior normal.      Outpatient Encounter Medications as of 01/06/2023  Medication Sig   nystatin cream (MYCOSTATIN) Apply 1 Application topically 2 (two) times daily.   cholecalciferol (VITAMIN D3) 25 MCG (1000 UNIT) tablet Take 1,000 Units by mouth daily.   ferrous sulfate 325 (65 FE) MG EC tablet Take 325 mg by mouth daily with breakfast.   fluticasone (FLONASE) 50 MCG/ACT nasal spray Place 2 sprays into both nostrils daily.   levocetirizine (XYZAL) 5 MG tablet Take 1 tablet (5 mg total) by mouth every evening.   olmesartan-hydrochlorothiazide (BENICAR HCT) 20-12.5 MG  tablet TAKE 1 TABLET BY MOUTH ONCE DAILY   pantoprazole (PROTONIX) 40 MG tablet TAKE 1 TABLET BY MOUTH ONCE DAILY   [DISCONTINUED] prochlorperazine (COMPAZINE) 10 MG tablet Take 1 tablet (10 mg total) by mouth every 6 (six) hours as needed (Nausea or vomiting). (Patient not taking: Reported on 09/29/2021)   Facility-Administered Encounter Medications as of 01/06/2023  Medication   0.9 %  sodium chloride infusion   sodium chloride 0.9 % 1,000 mL with potassium chloride 20 mEq, magnesium sulfate 2 g infusion     Lab Results  Component Value Date   WBC 9.5 12/20/2022   HGB 12.3 12/20/2022   HCT 36.3 12/20/2022   PLT 258 12/20/2022   GLUCOSE 95 12/20/2022   CHOL 171 08/26/2021   TRIG 182.0 (H) 08/26/2021   HDL 39.10 08/26/2021   LDLCALC 96 08/26/2021   ALT 11 12/20/2022   AST 16 12/20/2022   NA 137 12/20/2022   K 4.4 12/20/2022   CL 101 12/20/2022   CREATININE 0.97 12/20/2022   BUN 23 12/20/2022   CO2 27 12/20/2022   TSH 0.778 07/06/2022   INR 1.0 03/18/2014   INR 1.0 03/18/2014   HGBA1C 5.5 08/26/2021   MICROALBUR 5.2 (H) 01/19/2021       Assessment & Plan:  Routine general medical examination at a health care facility  Health care maintenance Assessment & Plan: Physical today 01/06/23.  Has wanted to hold on bone density and mammogram.     Hyperglycemia -     Hemoglobin A1c; Future  Hypercholesterolemia Assessment & Plan: Follow lipid panel.   Orders: -     Lipid panel; Future  Abdominal aortic aneurysm (AAA) without rupture, unspecified part [facility]) Assessment & Plan: Appt AVVS 05/2022 - stable.  Recommended f/u in 12 months.    Anemia, unspecified type Assessment & Plan: Follow cbc.  Being followed by hematology.    Pulmonary emphysema, unspecified emphysema type (  HCC) Assessment & Plan: Breathing stable.    Malignant neoplasm of esophagus, unspecified location [facility]) Assessment & Plan: Last visit 12/20/22 - follow up- squamous cell carcinoma of the  midesophagus-with recurrent disease in the mediastinum. Opdivo held - given the concern for autoimmune colitis from [facility].  She is off prednisone. Completed physical therapy for bilateral thigh weakness - steroid myopathy.  Stable. Per report, No evidence of hypermetabolic recurrent or metastatic esophageal primary. 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 New tiny right pleural effusion may be radiation induced. Hold EGD. Plan to do PET in 2-3 months. If she develops symptoms or concerning findings on PET, consider EGD.   Malignant neoplasm of right upper lobe of lung (HCC) Assessment & Plan: Right upper lobe lung cancer - adeno ca - stage IV.  S/p chemoradiation.  Seeing Dr Donneta Romberg. 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 Opdivo held - given the concern for autoimmune colitis from [facility].  She is off prednisone. Completed physical therapy for bilateral thigh weakness - steroid myopathy.  Stable. Per report, No evidence of hypermetabolic recurrent or metastatic esophageal primary. 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 New tiny right pleural effusion may be radiation induced. Hold EGD. Plan to do PET in 2-3 months. If she develops symptoms or concerning findings on PET, consider EGD.   Type 2 diabetes mellitus with hyperglycemia, without long-term current use of insulin (HCC) Assessment & Plan: Hold on medication.  Follow met b and a1c. Off prednisone.    PAD (peripheral artery disease) (HCC) Assessment & Plan: Not on cholesterol medication.  Continue blood pressure control.    Osteoporosis without current pathological fracture, unspecified osteoporosis type Assessment & Plan: Incidental mild compression fracture of L2.  Discussed with her today. Discussed bone density, osteoporosis and treatment.  She declines.  Will notify me if she changes her  mind.    Low TSH level Assessment & Plan: Being followed by Dr Evlyn Kanner.     Primary hypertension Assessment & Plan: Continue benicar/hctz.  Blood pressure has been doing well.   Follow pressures.  Follow metabolic panel.    Gastroesophageal reflux disease, unspecified whether esophagitis present Assessment & Plan: Continues on protonix.     Diarrhea, unspecified type Assessment & Plan: Opdivo held - given the concern for autoimmune colitis from [facility].  She is off prednisone. Bowels better.    Other orders -     Nystatin; Apply 1 Application topically 2 (two) times daily.  Dispense: 30 g; Refill: 0     Dale Kentwood, MD

## 2023-01-06 NOTE — Patient Instructions (Signed)
Change protonix to take 30 minutes before your evening meal.

## 2023-01-08 ENCOUNTER — Encounter: Payer: Self-pay | Admitting: Internal Medicine

## 2023-01-08 NOTE — Assessment & Plan Note (Signed)
Opdivo held - given the concern for autoimmune colitis from [facility].  She is off prednisone. Bowels better.

## 2023-01-08 NOTE — Assessment & Plan Note (Signed)
Breathing stable.

## 2023-01-08 NOTE — Assessment & Plan Note (Signed)
Follow lipid panel.   

## 2023-01-08 NOTE — Assessment & Plan Note (Signed)
Incidental mild compression fracture of L2.  Discussed with her today. Discussed bone density, osteoporosis and treatment.  She declines.  Will notify me if she changes her mind.

## 2023-01-08 NOTE — Assessment & Plan Note (Signed)
Being followed by Dr Evlyn Kanner.

## 2023-01-08 NOTE — Assessment & Plan Note (Signed)
Right upper lobe lung cancer - adeno ca - stage IV.  S/p chemoradiation.  Seeing Dr Donneta Romberg. 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 Opdivo held - given the concern for autoimmune colitis from [facility].  She is off prednisone. Completed physical therapy for bilateral thigh weakness - steroid myopathy.  Stable. Per report, No evidence of hypermetabolic recurrent or metastatic esophageal primary. 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 New tiny right pleural effusion may be radiation induced. Hold EGD. Plan to do PET in 2-3 months. If she develops symptoms or concerning findings on PET, consider EGD.

## 2023-01-08 NOTE — Assessment & Plan Note (Signed)
Follow cbc.  Being followed by hematology.

## 2023-01-08 NOTE — Assessment & Plan Note (Signed)
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 Opdivo held - given the concern for autoimmune colitis from [facility].  She is off prednisone. Completed physical therapy for bilateral thigh weakness - steroid myopathy.  Stable. Per report, No evidence of hypermetabolic recurrent or metastatic esophageal primary. 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 New tiny right pleural effusion may be radiation induced. Hold EGD. Plan to do PET in 2-3 months. If she develops symptoms or concerning findings on PET, consider EGD.

## 2023-01-08 NOTE — Assessment & Plan Note (Signed)
Continues on protonix.  ?

## 2023-01-08 NOTE — Assessment & Plan Note (Signed)
Appt [facility] 05/2022 - stable.  Recommended f/u in 12 months.

## 2023-01-08 NOTE — Assessment & Plan Note (Signed)
Hold on medication.  Follow met b and a1c. Off prednisone.

## 2023-01-08 NOTE — Assessment & Plan Note (Signed)
Continue benicar/hctz.  Blood pressure has been doing well.   Follow pressures.  Follow metabolic panel.

## 2023-01-08 NOTE — Assessment & Plan Note (Signed)
Not on cholesterol medication.  Continue blood pressure control.

## 2023-01-09 ENCOUNTER — Ambulatory Visit: Payer: Medicare PPO | Admitting: Physical Therapy

## 2023-01-10 ENCOUNTER — Ambulatory Visit: Payer: Medicare PPO | Admitting: Physical Therapy

## 2023-01-12 ENCOUNTER — Ambulatory Visit: Payer: Medicare PPO | Admitting: Physical Therapy

## 2023-01-16 ENCOUNTER — Ambulatory Visit: Payer: Medicare PPO | Admitting: Physical Therapy

## 2023-01-17 ENCOUNTER — Ambulatory Visit: Payer: Medicare PPO | Admitting: Physical Therapy

## 2023-01-19 ENCOUNTER — Ambulatory Visit: Payer: Medicare PPO | Admitting: Physical Therapy

## 2023-01-23 ENCOUNTER — Other Ambulatory Visit: Payer: Self-pay

## 2023-01-23 MED ORDER — PANTOPRAZOLE SODIUM 40 MG PO TBEC: 40.0000 mg | DELAYED_RELEASE_TABLET | Freq: Every day | ORAL | 3 refills | Status: DC

## 2023-01-24 ENCOUNTER — Ambulatory Visit: Payer: Medicare PPO | Admitting: Physical Therapy

## 2023-01-26 ENCOUNTER — Ambulatory Visit: Payer: Medicare PPO | Admitting: Physical Therapy

## 2023-01-31 ENCOUNTER — Ambulatory Visit: Payer: Medicare PPO | Admitting: Physical Therapy

## 2023-02-02 ENCOUNTER — Ambulatory Visit: Payer: Medicare PPO | Admitting: Physical Therapy

## 2023-02-09 ENCOUNTER — Ambulatory Visit: Payer: Medicare PPO | Admitting: Physical Therapy

## 2023-02-14 ENCOUNTER — Ambulatory Visit
Admission: RE | Admit: 2023-02-14 | Discharge: 2023-02-14 | Disposition: A | Discharge status: Home / Self Care | Payer: Medicare PPO | Source: Ambulatory Visit | Attending: Nurse Practitioner | Admitting: Nurse Practitioner

## 2023-02-14 DIAGNOSIS — Z8501 Personal history of malignant neoplasm of esophagus: Secondary | ICD-10-CM | POA: Diagnosis not present

## 2023-02-14 DIAGNOSIS — Z08 Encounter for follow-up examination after completed treatment for malignant neoplasm: Secondary | ICD-10-CM | POA: Insufficient documentation

## 2023-02-14 LAB — GLUCOSE, CAPILLARY: Glucose-Capillary: 117 mg/dL — ABNORMAL HIGH (ref 70–99)

## 2023-02-14 MED ORDER — FLUDEOXYGLUCOSE F - 18 (FDG) INJECTION
6.9000 | Freq: Once | INTRAVENOUS | Status: AC | PRN
  Administered 2023-02-14: 7.43 via INTRAVENOUS

## 2023-02-16 ENCOUNTER — Ambulatory Visit: Payer: Medicare PPO | Admitting: Physical Therapy

## 2023-02-23 ENCOUNTER — Ambulatory Visit: Payer: Medicare PPO | Admitting: Physical Therapy

## 2023-02-28 ENCOUNTER — Ambulatory Visit: Payer: Medicare PPO | Admitting: Physical Therapy

## 2023-03-02 ENCOUNTER — Ambulatory Visit: Payer: Medicare PPO | Admitting: Physical Therapy

## 2023-03-07 ENCOUNTER — Ambulatory Visit: Payer: Medicare PPO | Admitting: Physical Therapy

## 2023-03-09 ENCOUNTER — Ambulatory Visit: Payer: Medicare PPO | Admitting: Physical Therapy

## 2023-03-14 ENCOUNTER — Ambulatory Visit: Payer: Medicare PPO | Admitting: Physical Therapy

## 2023-03-16 ENCOUNTER — Ambulatory Visit: Payer: Medicare PPO | Admitting: Physical Therapy

## 2023-03-21 ENCOUNTER — Inpatient Hospital Stay: Payer: Medicare PPO | Attending: Internal Medicine

## 2023-03-21 ENCOUNTER — Encounter: Payer: Self-pay | Admitting: Internal Medicine

## 2023-03-21 ENCOUNTER — Ambulatory Visit: Payer: Medicare PPO | Admitting: Physical Therapy

## 2023-03-21 ENCOUNTER — Inpatient Hospital Stay (HOSPITAL_BASED_OUTPATIENT_CLINIC_OR_DEPARTMENT_OTHER): Payer: Medicare PPO | Admitting: Internal Medicine

## 2023-03-21 ENCOUNTER — Other Ambulatory Visit: Payer: Self-pay | Admitting: Internal Medicine

## 2023-03-21 DIAGNOSIS — D490 Neoplasm of unspecified behavior of digestive system: Secondary | ICD-10-CM | POA: Diagnosis not present

## 2023-03-21 DIAGNOSIS — D649 Anemia, unspecified: Secondary | ICD-10-CM | POA: Diagnosis not present

## 2023-03-21 DIAGNOSIS — M899 Disorder of bone, unspecified: Secondary | ICD-10-CM

## 2023-03-21 DIAGNOSIS — C154 Malignant neoplasm of middle third of esophagus: Secondary | ICD-10-CM | POA: Diagnosis not present

## 2023-03-21 DIAGNOSIS — Z08 Encounter for follow-up examination after completed treatment for malignant neoplasm: Secondary | ICD-10-CM

## 2023-03-21 LAB — CMP (CANCER CENTER ONLY)
ALT: 11 U/L (ref 0–44)
AST: 16 U/L (ref 15–41)
Albumin: 4.2 g/dL (ref 3.5–5.0)
Alkaline Phosphatase: 69 U/L (ref 38–126)
Anion gap: 8 (ref 5–15)
BUN: 22 mg/dL (ref 8–23)
CO2: 27 mmol/L (ref 22–32)
Calcium: 10 mg/dL (ref 8.9–10.3)
Chloride: 102 mmol/L (ref 98–111)
Creatinine: 0.85 mg/dL (ref 0.44–1.00)
GFR, Estimated: >60 mL/min (ref >60)
Glucose, Bld: 153 mg/dL — ABNORMAL HIGH (ref 70–99)
Potassium: 4.2 mmol/L (ref 3.5–5.1)
Sodium: 137 mmol/L (ref 135–145)
Total Bilirubin: 0.7 mg/dL (ref 0.3–1.2)
Total Protein: 7.8 g/dL (ref 6.5–8.1)

## 2023-03-21 LAB — CBC WITH DIFFERENTIAL (CANCER CENTER ONLY)
Abs Immature Granulocytes: 0.02 10*3/uL (ref 0.00–0.07)
Basophils Absolute: 0.1 10*3/uL (ref 0.0–0.1)
Basophils Relative: 1 %
Eosinophils Absolute: 0.2 10*3/uL (ref 0.0–0.5)
Eosinophils Relative: 3 %
HCT: 36.5 % (ref 36.0–46.0)
Hemoglobin: 12.3 g/dL (ref 12.0–15.0)
Immature Granulocytes: 0 %
Lymphocytes Relative: 31 %
Lymphs Abs: 2.5 10*3/uL (ref 0.7–4.0)
MCH: 29.9 pg (ref 26.0–34.0)
MCHC: 33.7 g/dL (ref 30.0–36.0)
MCV: 88.8 fL (ref 80.0–100.0)
Monocytes Absolute: 0.6 10*3/uL (ref 0.1–1.0)
Monocytes Relative: 8 %
Neutro Abs: 4.5 10*3/uL (ref 1.7–7.7)
Neutrophils Relative %: 57 %
Platelet Count: 258 10*3/uL (ref 150–400)
RBC: 4.11 MIL/uL (ref 3.87–5.11)
RDW: 12.8 % (ref 11.5–15.5)
WBC Count: 7.8 10*3/uL (ref 4.0–10.5)
nRBC: 0 % (ref 0.0–0.2)

## 2023-03-21 NOTE — Assessment & Plan Note (Addendum)
#   Squamous cell carcinoma of the mid-esophagus [NOV 2022]-  locally advanced; RECURRENT [s/p EUS- LB Bx-] MAY 18th, 2023-most recently status post Opdivoq 2 W x4 cycles- [held because of poor tolerance/diarrhea last July 23rd, 2023];SEP 3rd, 2024- PET scan-  Mild focal hypermetabolism associated with the distal esophagus, without a definitive CT correlate. Difficult to exclude disease recurrence; Increasing hypermetabolism associated with an L2 inferior endplate compression fracture. Additionally, lucency associated with the inferior endplate appears larger.   # Regarding L2 lesion concerning for recurrence versus compression fracture/osteoporosis-discussed with IR [Dr.El-Abd]. Discussed re: biopsy/osteocool. Check BMD.   # Continue to hold Opdivo at this time given patient's patient's colitis on immunotherapy.  Currently off steroids.  # Autoimmune colitis/Opdivo induced diarrhea-s/p evaluation with GI; Dr.Wohl- improved- currently  OFF prednisone -monitor for now.  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   # Mild  Anemia- improved- Hb 12.3 stable; continue gentle iron- space to every other day- stable.   # PBF-BG- 150  Monitor for now [Gluerna protein shakes]- continue OFF prednisone as above. Stable.   #Hx of goitre-/ TSH low-however normal free T3-T4- .currently on surveillance s/p evaluation with  Dr. Evlyn Kanner, [facility] endocrinology. Ordered labs per endo. Stable.   # Vaccinations: ok with Flu shot; and Covid-19.; ok with RSV  #Incidental findings on Imaging  PET SEP , 2024: Atherosclerosis ; infrarenal abdominal aortic aneurysm including at 5.0 x 4.6 cm. Recommend continued follow-up  with Dr.scheiner  emphysema; reviewed with the patient counseled.  # IV access: PIV.   # DISPOSITION:  # refer to IR [Dr.El-Abd] re: L2 compression fracture.  # follow up in  6 weeks MD; labs- cbc/cmp; NO treatment- prior BMD- Dr.B  # I reviewed the blood work- with the  patient in detail; also reviewed the imaging independently [as summarized above]; and with the patient in detail.   # 40 minutes face-to-face with the patient discussing the above plan of care; more than 50% of time spent on prognosis/ natural history; counseling and coordination.

## 2023-03-21 NOTE — Progress Notes (Signed)
Patient is here for her PET scan results that she had done on 02/14/2023. She states that she is doing very well.

## 2023-03-21 NOTE — Progress Notes (Signed)
[facility] OFFICE PROGRESS NOTE  Patient Care Team: Dale Milan, MD as PCP - General (Internal Medicine) Benita Gutter, RN as Oncology Nurse Navigator Earna Coder, MD as Consulting Physician (Oncology) Midge Minium, MD as Consulting Physician (Gastroenterology) Borders, Daryl Eastern, NP as Nurse Practitioner (Hospice and Palliative Medicine) Keitha Butte, RN as Registered Nurse (Oncology)   Cancer Staging  Neoplasm of middle third of esophagus Staging form: Esophagus - Squamous Cell Carcinoma, AJCC 8th Edition - Clinical: Stage Unknown (cTX, cN1, cM0) - Signed by Earna Coder, MD on 05/10/2021 - Pathologic: No stage assigned - Unsigned   Oncology History Overview Note  # 2015-patient is an 89 year old female with 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  Starting radiation and chemotherapy from April 14, 2014 Patient was started on carboplatinum andTaxol Herve were developed an allergic reaction to Taxol so would be changed to Abraxane 3.patient has finished 6 cycles of weekly chemotherapy with carboplatinum  and radiation therapy(May 28, 2014) 4.started on  NIVOLULAMAB because of persistent disease July 02, 2014. 5.  NIVOLULAMAB was discontinued because of persistent diarrhea in July of 2016.  August of 2016 CT scan was stable so no further chemotherapy  # AUG 25th PET- STABLE RUL MASS [radiation fibrosis; <1cm ? 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   APRIL 16th, 2023- PET scan-  Resolution of metabolic activity in the mid esophagus;  Persistent hypermetabolic  activity within a subcarinal lymph node. Differential includes residual carcinoma versus reactive adenopathy.  No evidence of distant metastatic disease.  # MAY 18th, 2023- s/p EUS [Dr.Spaete]-endoscopic biopsy of the mediastinal lymph node. MAY 18th, 2023- Status post endoscopic ultrasound/biopsy of the mediastinal/subcarinal lymph node- BIOPSY POSITIVE FOR RECURRENT SQUAMOUS CELL.  # JUNE 9th, 2023- Opdivo every 2 weeks.   # AAA/ 3.3x 3.9 Stable [Dr.Schnier] -----------------------------------------------------       History of lung cancer  Malignant neoplasm of right upper lobe of lung (HCC)  08/07/2019 Initial Diagnosis   Malignant neoplasm of right upper lobe of lung (HCC)    INTERVAL HISTORY: Ambulating with a rolling walker.  Accompanied by Gunnar Fusi.   Heather Cooper 89 y.o.  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 Patient's Opdivo is currently on HOLD given the concern for autoimmune colitis from immunotherapy is reviewed the results of her restaging PET scan.  Denies any diarrhea. She is more  up and about.  Patient is  s/p physical therapy. No difficulty swallowing.   Review of Systems  Constitutional:  Positive for malaise/fatigue. Negative for chills, diaphoresis, fever and weight loss.  HENT:  Negative for nosebleeds and sore throat.   Eyes:  Negative for double vision.  Respiratory:  Negative for hemoptysis, sputum production, shortness of breath and wheezing.   Cardiovascular:  Negative for chest pain, palpitations, orthopnea and leg swelling.  Gastrointestinal:  Negative for abdominal pain, blood in stool, constipation, diarrhea, heartburn, melena, nausea and vomiting.  Genitourinary:  Negative for dysuria, frequency and urgency.  Musculoskeletal:  Negative for back pain and joint pain.  Skin: Negative.  Negative for itching and rash.  Neurological:  Negative for  dizziness, tingling, focal weakness, weakness and headaches.  Endo/Heme/Allergies:  Does not bruise/bleed easily.  Psychiatric/Behavioral:  Negative for depression. The patient is not nervous/anxious and does not have insomnia.    PAST MEDICAL HISTORY :  Past Medical History:  Diagnosis Date   Chemotherapy induced nausea and vomiting    Chicken pox    Esophageal cancer (HCC)    Hypercalcemia    familial hypocalciuric hypercalcemia   Hypercholesterolemia    Hypertension    Lung cancer (HCC)    Osteoporosis    Thyroid disease     PAST SURGICAL HISTORY :   Past Surgical History:  Procedure Laterality Date   ABDOMINAL HYSTERECTOMY     partial   CHOLECYSTECTOMY     COLONOSCOPY WITH PROPOFOL N/A 04/17/2017   Procedure: COLONOSCOPY WITH PROPOFOL;  Surgeon: Scot Jun, MD;  Location: [facility] ENDOSCOPY;  Service: Endoscopy;  Laterality: N/A;   ESOPHAGOGASTRODUODENOSCOPY (EGD) WITH PROPOFOL N/A 04/13/2021   Procedure: ESOPHAGOGASTRODUODENOSCOPY (EGD) WITH PROPOFOL;  Surgeon: Midge Minium, MD;  Location: [facility] ENDOSCOPY;  Service: Endoscopy;  Laterality: N/A;   EUS N/A 04/22/2021   Procedure: FULL UPPER ENDOSCOPIC ULTRASOUND (EUS) RADIAL;  Surgeon: Rayann Heman, MD;  Location: [facility] ENDOSCOPY;  Service: Endoscopy;  Laterality: N/A;  [facility] needed   EUS N/A 10/28/2021   Procedure: UPPER ENDOSCOPIC ULTRASOUND (EUS) LINEAR;  Surgeon: Doren Custard, MD;  Location: [facility] ENDOSCOPY;  Service: Gastroenterology;  Laterality: N/A;  [facility]   transvaginal hysterectomy  04/18/05   with anterior colporrhaphy    FAMILY HISTORY :   Family History  Problem Relation Age of Onset   Stroke Mother    Hypertension Mother    Prostate cancer Father    Cancer Father        prostate   Heart disease Brother        s/p CABG   Colon cancer Neg Hx     SOCIAL HISTORY:   Social History   Tobacco Use   Smoking status: Former    Current packs/day: 0.00    Types: Cigarettes    Quit date: 06/13/1997     Years since quitting: 25.7   Smokeless tobacco: Never  Vaping Use   Vaping status: Never Used  Substance Use Topics   Alcohol use: No    Alcohol/week: 0.0 standard drinks of alcohol   Drug use: No    ALLERGIES:  is allergic to paclitaxel.  MEDICATIONS:  Current Outpatient Medications  Medication Sig Dispense Refill   cholecalciferol (VITAMIN D3) 25 MCG (1000 UNIT) tablet Take 1,000 Units by mouth daily.     ferrous sulfate 325 (65 FE) MG EC tablet Take 325 mg by mouth daily with breakfast.     fluticasone (FLONASE) 50 MCG/ACT nasal spray Place 2 sprays into both nostrils daily. 16 g 3   levocetirizine (XYZAL) 5 MG tablet Take 1 tablet (5 mg total) by mouth every evening. 90 tablet 2   nystatin cream (MYCOSTATIN) Apply 1 Application topically 2 (two) times daily. 30 g 0   olmesartan-hydrochlorothiazide (BENICAR HCT) 20-12.5 MG tablet TAKE 1 TABLET BY MOUTH ONCE DAILY 90 tablet 1   pantoprazole (PROTONIX) 40 MG tablet Take 1 tablet (40 mg total) by mouth daily. 90 tablet 3   No current facility-administered medications for this visit.    PHYSICAL EXAMINATION: ECOG PERFORMANCE STATUS: 0 - Asymptomatic  BP 132/75 (BP Location: Left Arm, Patient Position: Sitting, Cuff Size:  Normal)   Pulse 90   Temp 97.8 F (36.6 C) (Tympanic)   Wt 135 lb (61.2 kg)   SpO2 100%   BMI 23.91 kg/m   Filed Weights   03/21/23 0904  Weight: 135 lb (61.2 kg)         Physical Exam HENT:     Head: Normocephalic and atraumatic.     Mouth/Throat:     Pharynx: No oropharyngeal exudate.  Eyes:     Pupils: Pupils are equal, round, and reactive to light.  Cardiovascular:     Rate and Rhythm: Normal rate and regular rhythm.  Pulmonary:     Effort: Pulmonary effort is normal. No respiratory distress.     Breath sounds: Normal breath sounds. No wheezing.  Abdominal:     General: Bowel sounds are normal. There is no distension.     Palpations: Abdomen is soft. There is no mass.     Tenderness:  There is no abdominal tenderness. There is no guarding or rebound.  Musculoskeletal:        General: No tenderness. Normal range of motion.     Cervical back: Normal range of motion and neck supple.  Skin:    General: Skin is warm.  Neurological:     Mental Status: She is alert and oriented to person, place, and time.  Psychiatric:        Mood and Affect: Affect normal.      LABORATORY DATA:  I have reviewed the data as listed    Component Value Date/Time   NA 137 03/21/2023 0849   NA 130 (L) 10/09/2014 0846   K 4.2 03/21/2023 0849   K 4.1 10/09/2014 0846   CL 102 03/21/2023 0849   CL 98 (L) 10/09/2014 0846   CO2 27 03/21/2023 0849   CO2 25 10/09/2014 0846   GLUCOSE 153 (H) 03/21/2023 0849   GLUCOSE 161 (H) 10/09/2014 0846   BUN 22 03/21/2023 0849   BUN 14 10/09/2014 0846   CREATININE 0.85 03/21/2023 0849   CREATININE 0.70 10/09/2014 0846   CALCIUM 10.0 03/21/2023 0849   CALCIUM 9.1 10/09/2014 0846   PROT 7.8 03/21/2023 0849   PROT 7.3 10/09/2014 0846   ALBUMIN 4.2 03/21/2023 0849   ALBUMIN 3.6 10/09/2014 0846   AST 16 03/21/2023 0849   ALT 11 03/21/2023 0849   ALT 13 (L) 10/09/2014 0846   ALKPHOS 69 03/21/2023 0849   ALKPHOS 70 10/09/2014 0846   BILITOT 0.7 03/21/2023 0849   GFRNONAA >60 03/21/2023 0849   GFRNONAA >60 10/09/2014 0846   GFRAA >60 02/05/2020 1004   GFRAA >60 10/09/2014 0846    No results found for: "SPEP", "UPEP"  Lab Results  Component Value Date   WBC 7.8 03/21/2023   NEUTROABS 4.5 03/21/2023   HGB 12.3 03/21/2023   HCT 36.5 03/21/2023   MCV 88.8 03/21/2023   PLT 258 03/21/2023      Chemistry      Component Value Date/Time   NA 137 03/21/2023 0849   NA 130 (L) 10/09/2014 0846   K 4.2 03/21/2023 0849   K 4.1 10/09/2014 0846   CL 102 03/21/2023 0849   CL 98 (L) 10/09/2014 0846   CO2 27 03/21/2023 0849   CO2 25 10/09/2014 0846   BUN 22 03/21/2023 0849   BUN 14 10/09/2014 0846   CREATININE 0.85 03/21/2023 0849   CREATININE 0.70  10/09/2014 0846   GLU 111 03/18/2014 1048      Component Value Date/Time  CALCIUM 10.0 03/21/2023 0849   CALCIUM 9.1 10/09/2014 0846   ALKPHOS 69 03/21/2023 0849   ALKPHOS 70 10/09/2014 0846   AST 16 03/21/2023 0849   ALT 11 03/21/2023 0849   ALT 13 (L) 10/09/2014 0846   BILITOT 0.7 03/21/2023 0849       RADIOGRAPHIC STUDIES: I have personally reviewed the radiological images as listed and agreed with the findings in the report. No results found.    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 Difficult to exclude disease recurrence; Increasing hypermetabolism associated with an L2 inferior endplate compression fracture. Additionally, lucency associated with the inferior endplate appears larger.   # Regarding L2 lesion concerning for recurrence versus compression fracture/osteoporosis-discussed with IR [Dr.El-Abd]. Discussed re: biopsy/osteocool. Check BMD.   # Continue to hold Opdivo at this time given patient's patient's colitis on immunotherapy.  Currently off steroids.  # Autoimmune colitis/Opdivo induced diarrhea-s/p evaluation with GI; Dr.Wohl- improved- currently  OFF prednisone -monitor for now.  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   # Mild  Anemia- improved- Hb 12.3 stable; continue gentle iron- space to every other day- stable.   # PBF-BG- 150  Monitor for now [Gluerna protein shakes]- continue OFF prednisone as above. Stable.   #Hx of goitre-/ TSH low-however normal free T3-T4- .currently on surveillance s/p evaluation with  Dr. Evlyn Kanner, [facility] endocrinology. Ordered  labs per endo. Stable.   # Vaccinations: ok with Flu shot; and Covid-19.; ok with RSV  #Incidental findings on Imaging  PET SEP , 2024: Atherosclerosis ; infrarenal abdominal aortic aneurysm including at 5.0 x 4.6 cm. Recommend continued follow-up  with Dr.scheiner  emphysema; reviewed with the patient counseled.  # IV access: PIV.   # DISPOSITION:  # refer to IR [Dr.El-Abd] re: L2 compression fracture.  # follow up in  6 weeks MD; labs- cbc/cmp; NO treatment- prior BMD- Dr.B  # I reviewed the blood work- with the patient in detail; also reviewed the imaging independently [as summarized above]; and with the patient in detail.   # 40 minutes face-to-face with the patient discussing the above plan of care; more than 50% of time spent on prognosis/ natural history; counseling and coordination.       Orders Placed This Encounter  Procedures   DG Bone Density    Standing Status:   Future    Standing Expiration Date:   03/20/2024    Order Specific Question:   Reason for Exam (SYMPTOM  OR DIAGNOSIS REQUIRED)    Answer:   hx of cancer    Order Specific Question:   Preferred imaging location?    Answer:   [facility]   CMP (Cancer Center only)    Standing Status:   Future    Standing Expiration Date:   03/20/2024   CBC with Differential (Cancer Center Only)    Standing Status:   Future    Standing Expiration Date:   03/20/2024    All questions were answered. The patient knows to call the clinic with any problems, questions or concerns.      Earna Coder, MD 03/21/2023 2:27 PM

## 2023-03-23 ENCOUNTER — Ambulatory Visit: Payer: Medicare PPO | Admitting: Physical Therapy

## 2023-03-28 ENCOUNTER — Ambulatory Visit: Payer: Medicare PPO | Admitting: Physical Therapy

## 2023-03-30 ENCOUNTER — Other Ambulatory Visit: Payer: Self-pay | Admitting: Interventional Radiology

## 2023-03-30 ENCOUNTER — Encounter: Payer: Self-pay | Admitting: Interventional Radiology

## 2023-03-30 ENCOUNTER — Encounter: Payer: Self-pay | Admitting: Internal Medicine

## 2023-03-30 ENCOUNTER — Ambulatory Visit
Admission: RE | Admit: 2023-03-30 | Discharge: 2023-03-30 | Disposition: A | Discharge status: Home / Self Care | Payer: TRICARE For Life (TFL) | Source: Ambulatory Visit | Attending: Internal Medicine | Admitting: Internal Medicine

## 2023-03-30 ENCOUNTER — Ambulatory Visit: Payer: Medicare PPO | Admitting: Physical Therapy

## 2023-03-30 ENCOUNTER — Telehealth: Payer: Self-pay | Admitting: *Deleted

## 2023-03-30 DIAGNOSIS — M898X9 Other specified disorders of bone, unspecified site: Secondary | ICD-10-CM | POA: Diagnosis not present

## 2023-03-30 DIAGNOSIS — M899 Disorder of bone, unspecified: Secondary | ICD-10-CM

## 2023-03-30 DIAGNOSIS — Z8501 Personal history of malignant neoplasm of esophagus: Secondary | ICD-10-CM | POA: Diagnosis not present

## 2023-03-30 DIAGNOSIS — Z85118 Personal history of other malignant neoplasm of bronchus and lung: Secondary | ICD-10-CM | POA: Diagnosis not present

## 2023-03-30 DIAGNOSIS — Z08 Encounter for follow-up examination after completed treatment for malignant neoplasm: Secondary | ICD-10-CM

## 2023-03-30 HISTORY — PX: IR RADIOLOGIST EVAL & MGMT: IMG5224

## 2023-03-30 NOTE — H&P (Signed)
Interventional Radiology - Clinic Visit, Initial H&P    Referring Provider: Earna Coder, *  Reason for Visit: L2 bone lesion     History of Present Illness   Patient was seen today in the interventional radiology clinic at the request of Dr. Donneta Romberg.  Patient has a history of lung cancer in 2015 and esophageal cancer in 2022, both treated with courses of chemoradiation.  Her most recent PET-CT on February 14, 2023 demonstrated progressive lucency and hypermetabolism of an L2 inferior endplate fracture, query possible pathologic involvement.    The patient presents today with no complaints of back pain.  She states that she occasionally had some back soreness which improved after changing her mattress.  She is able to be mobile with a walker without any difficulty otherwise.    Additional Past Medical History Past Medical History:  Diagnosis Date   Chemotherapy induced nausea and vomiting    Chicken pox    Esophageal cancer (HCC)    Hypercalcemia    familial hypocalciuric hypercalcemia   Hypercholesterolemia    Hypertension    Lung cancer (HCC)    Osteoporosis    Thyroid disease      Surgical History  Past Surgical History:  Procedure Laterality Date   ABDOMINAL HYSTERECTOMY     partial   CHOLECYSTECTOMY     COLONOSCOPY WITH PROPOFOL N/A 04/17/2017   Procedure: COLONOSCOPY WITH PROPOFOL;  Surgeon: Scot Jun, MD;  Location: [facility] ENDOSCOPY;  Service: Endoscopy;  Laterality: N/A;   ESOPHAGOGASTRODUODENOSCOPY (EGD) WITH PROPOFOL N/A 04/13/2021   Procedure: ESOPHAGOGASTRODUODENOSCOPY (EGD) WITH PROPOFOL;  Surgeon: Midge Minium, MD;  Location: [facility] ENDOSCOPY;  Service: Endoscopy;  Laterality: N/A;   EUS N/A 04/22/2021   Procedure: FULL UPPER ENDOSCOPIC ULTRASOUND (EUS) RADIAL;  Surgeon: Rayann Heman, MD;  Location: [facility] ENDOSCOPY;  Service: Endoscopy;  Laterality: N/A;  [facility] needed   EUS N/A 10/28/2021   Procedure: UPPER ENDOSCOPIC ULTRASOUND (EUS) LINEAR;   Surgeon: Doren Custard, MD;  Location: [facility] ENDOSCOPY;  Service: Gastroenterology;  Laterality: N/A;  [facility]   transvaginal hysterectomy  04/18/05   with anterior colporrhaphy     Medications  I have reviewed the current medication list. Refer to chart for details. Current Outpatient Medications  Medication Instructions   cholecalciferol (VITAMIN D3) 1,000 Units, Oral, Daily   ferrous sulfate 325 mg, Oral, Daily with breakfast   fluticasone (FLONASE) 50 MCG/ACT nasal spray 2 sprays, Each Nare, Daily   levocetirizine (XYZAL) 5 mg, Oral, Every evening   nystatin cream (MYCOSTATIN) 1 Application, Topical, 2 times daily   olmesartan-hydrochlorothiazide (BENICAR HCT) 20-12.5 MG tablet TAKE 1 TABLET BY MOUTH ONCE DAILY   pantoprazole (PROTONIX) 40 mg, Oral, Daily      Allergies Allergies  Allergen Reactions   Paclitaxel Other (See Comments)    Chest tightness   Does patient have contrast allergy: No     Physical Exam Current Vitals Temp: 97.9 F (36.6 C) (Temp Source: Oral)  Pulse Rate: 84  Resp: 14  BP: 112/61  SpO2: 97 %     Weight: 61.2 kg  Body mass index is 23.91 kg/m.  General: Alert and answers questions appropriately. No apparent distress. HEENT: Normocephalic, atraumatic. Conjunctivae normal without scleral icterus. Cardiac: Regular rate and rhythm. No dependent edema. Pulmonary: Normal work of breathing. On room air. Back: No TTP over lower back    Pertinent Lab Results    Latest Ref Rng & Units 03/21/2023    8:49 AM 12/20/2022   12:56  PM 09/13/2022    9:54 AM  CBC  WBC 4.0 - 10.5 K/uL 7.8  9.5  7.8   Hemoglobin 12.0 - 15.0 g/dL 11.9  14.7  82.9   Hematocrit 36.0 - 46.0 % 36.5  36.3  36.4   Platelets 150 - 400 K/uL 258  258  239       Latest Ref Rng & Units 03/21/2023    8:49 AM 12/20/2022   12:56 PM 09/13/2022    9:54 AM  CMP  Glucose 70 - 99 mg/dL 562  95  130   BUN 8 - 23 mg/dL 22  23  21    Creatinine 0.44 - 1.00 mg/dL 8.65  7.84  6.96   Sodium  135 - 145 mmol/L 137  137  137   Potassium 3.5 - 5.1 mmol/L 4.2  4.4  3.9   Chloride 98 - 111 mmol/L 102  101  102   CO2 22 - 32 mmol/L 27  27  29    Calcium 8.9 - 10.3 mg/dL 29.5  28.4  13.2   Total Protein 6.5 - 8.1 g/dL 7.8  7.9  7.6   Total Bilirubin 0.3 - 1.2 mg/dL 0.7  0.5  0.3   Alkaline Phos 38 - 126 U/L 69  74  74   AST 15 - 41 U/L 16  16  18    ALT 0 - 44 U/L 11  11  10        Relevant and/or Recent Imaging: PET CT 02/14/2023    Assessment & Plan Heather Cooper is a 89 y.o. female with a history of lung cancer in 2015 and esophageal cancer in 2022, both treated with courses of chemoradiation.  Her most recent PET-CT on February 14, 2023 demonstrated progressive lucency and hypermetabolism of an L2 inferior endplate fracture, query possible pathologic involvement.   In the abscence of back pain, there is no indication to proceed with OsteoCool treatment.   However, the lesion is accessible for percutaneous tissue sampling.    Plan:  CT guided core biopsy of L2 lesion with moderate sedation. Schedule at [facility].    Total time spent on today's visit was over 40 Minutes, including both face-to-face time and non face-to-face time, personally spent on review of chart (including labs and relevant imaging), discussing further workup and treatment options, referral to specialist if needed, reviewing outside records if pertinent, answering patient questions, and coordinating care regarding L2 lesion as well as management strategy.      Olive Bass, MD  Vascular and Interventional Radiology 03/30/2023 11:38 AM

## 2023-03-30 NOTE — Telephone Encounter (Signed)
Pt will be having a Core biopsy of L2 lesion done by IR, Dr. Juliette Alcide. Procedure is scheduled for Weds Oct 23 rd arrival time is 10:30 [facility] beside the RD. Nothing to eat or drink after midnight. Will need a driver. Pt agreeable to above.

## 2023-04-03 ENCOUNTER — Ambulatory Visit
Admission: RE | Admit: 2023-04-03 | Discharge: 2023-04-03 | Disposition: A | Discharge status: Home / Self Care | Payer: Medicare PPO | Source: Ambulatory Visit | Attending: Internal Medicine | Admitting: Internal Medicine

## 2023-04-03 DIAGNOSIS — Z9221 Personal history of antineoplastic chemotherapy: Secondary | ICD-10-CM | POA: Insufficient documentation

## 2023-04-03 DIAGNOSIS — Z923 Personal history of irradiation: Secondary | ICD-10-CM | POA: Diagnosis not present

## 2023-04-03 DIAGNOSIS — D490 Neoplasm of unspecified behavior of digestive system: Secondary | ICD-10-CM | POA: Insufficient documentation

## 2023-04-03 DIAGNOSIS — M810 Age-related osteoporosis without current pathological fracture: Secondary | ICD-10-CM | POA: Diagnosis not present

## 2023-04-03 DIAGNOSIS — Z780 Asymptomatic menopausal state: Secondary | ICD-10-CM | POA: Diagnosis present

## 2023-04-03 NOTE — Progress Notes (Signed)
Spoke to patient daughter, Gunnar Fusi re: PET scan, awaiting bone Biopsy- 10/23. Also reviewed the bone density.   Follow up as planned, GB

## 2023-04-04 ENCOUNTER — Other Ambulatory Visit (HOSPITAL_COMMUNITY): Payer: Self-pay | Admitting: Radiology

## 2023-04-04 ENCOUNTER — Ambulatory Visit: Payer: Medicare PPO | Admitting: Physical Therapy

## 2023-04-04 DIAGNOSIS — Z85118 Personal history of other malignant neoplasm of bronchus and lung: Secondary | ICD-10-CM

## 2023-04-04 NOTE — Progress Notes (Signed)
Patient for CT guided L2 lesion biopsy on Wed 04/05/2023, I called and spoke with the patient on the phone and gave pre-procedure instructions. Pt was made aware to be here at 10:30a, NPO after MN prior to procedure as well as driver post procedure/recovery/discharge. Pt stated understanding.  Called 04/04/2023

## 2023-04-05 ENCOUNTER — Other Ambulatory Visit: Payer: Self-pay

## 2023-04-05 ENCOUNTER — Ambulatory Visit
Admission: RE | Admit: 2023-04-05 | Discharge: 2023-04-05 | Disposition: A | Discharge status: Home / Self Care | Payer: Medicare PPO | Source: Ambulatory Visit | Attending: Interventional Radiology | Admitting: Interventional Radiology

## 2023-04-05 DIAGNOSIS — M899 Disorder of bone, unspecified: Secondary | ICD-10-CM | POA: Diagnosis not present

## 2023-04-05 DIAGNOSIS — Z8501 Personal history of malignant neoplasm of esophagus: Secondary | ICD-10-CM | POA: Diagnosis not present

## 2023-04-05 DIAGNOSIS — Z85118 Personal history of other malignant neoplasm of bronchus and lung: Secondary | ICD-10-CM | POA: Diagnosis not present

## 2023-04-05 DIAGNOSIS — I10 Essential (primary) hypertension: Secondary | ICD-10-CM | POA: Insufficient documentation

## 2023-04-05 DIAGNOSIS — E7800 Pure hypercholesterolemia, unspecified: Secondary | ICD-10-CM | POA: Diagnosis not present

## 2023-04-05 DIAGNOSIS — M898X8 Other specified disorders of bone, other site: Secondary | ICD-10-CM | POA: Diagnosis not present

## 2023-04-05 LAB — CBC
HCT: 34.4 % — ABNORMAL LOW (ref 36.0–46.0)
Hemoglobin: 11.5 g/dL — ABNORMAL LOW (ref 12.0–15.0)
MCH: 29.7 pg (ref 26.0–34.0)
MCHC: 33.4 g/dL (ref 30.0–36.0)
MCV: 88.9 fL (ref 80.0–100.0)
Platelets: 215 10*3/uL (ref 150–400)
RBC: 3.87 MIL/uL (ref 3.87–5.11)
RDW: 12.8 % (ref 11.5–15.5)
WBC: 7.5 10*3/uL (ref 4.0–10.5)
nRBC: 0 % (ref 0.0–0.2)

## 2023-04-05 LAB — PROTIME-INR
INR: 1 (ref 0.8–1.2)
Prothrombin Time: 13.3 s (ref 11.4–15.2)

## 2023-04-05 MED ORDER — FENTANYL CITRATE (PF) 100 MCG/2ML IJ SOLN
INTRAMUSCULAR | Status: AC
  Filled 2023-04-05: qty 2

## 2023-04-05 MED ORDER — LIDOCAINE 1 % OPTIME INJ - NO CHARGE
10.0000 mL | Freq: Once | INTRAMUSCULAR | Status: AC
  Administered 2023-04-05: 10 mL
  Filled 2023-04-05: qty 10

## 2023-04-05 MED ORDER — FENTANYL CITRATE (PF) 100 MCG/2ML IJ SOLN
INTRAMUSCULAR | Status: AC | PRN
  Administered 2023-04-05: 25 ug via INTRAVENOUS

## 2023-04-05 MED ORDER — MIDAZOLAM HCL 2 MG/2ML IJ SOLN
INTRAMUSCULAR | Status: AC | PRN
Start: 2023-04-05 — End: 2023-04-05
  Administered 2023-04-05: .5 mg via INTRAVENOUS

## 2023-04-05 MED ORDER — SODIUM CHLORIDE 0.9 % IV SOLN: INTRAVENOUS | Status: DC

## 2023-04-05 MED ORDER — MIDAZOLAM HCL 2 MG/2ML IJ SOLN
INTRAMUSCULAR | Status: AC
  Filled 2023-04-05: qty 2

## 2023-04-05 NOTE — H&P (Signed)
Chief Complaint: Patient was seen in consultation today for L2 bone lesion  Referring Physician(s): Louretta Shorten, MD  Supervising Physician: Pernell Dupre  Patient Status: [facility] - Out-pt  History of Present Illness: Heather Cooper is a 89 y.o. female with PMH significant for esophageal cancer, hypercholesterolemia, hypertension, lung cancer, and osteoporosis being seen today in relation to L2 bone lesion. 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 Patient underwent PET scan on  02/14/23 which revealed hypermetabolic lesion of L2 vertebrae. Patient was referred to IR for evaluation for biopsy and was seen by Dr Juliette Alcide in consultation on 03/30/23.  Past Medical History:  Diagnosis Date   Chemotherapy induced nausea and vomiting    Chicken pox    Esophageal cancer (HCC)    Hypercalcemia    familial hypocalciuric hypercalcemia   Hypercholesterolemia    Hypertension    Lung cancer (HCC)    Osteoporosis    Thyroid disease     Past Surgical History:  Procedure Laterality Date   ABDOMINAL HYSTERECTOMY     partial   CHOLECYSTECTOMY     COLONOSCOPY WITH PROPOFOL N/A 04/17/2017   Procedure: COLONOSCOPY WITH PROPOFOL;  Surgeon: Scot Jun, MD;  Location: [name] ENDOSCOPY;  Service: Endoscopy;  Laterality: N/A;   ESOPHAGOGASTRODUODENOSCOPY (EGD) WITH PROPOFOL N/A 04/13/2021   Procedure: ESOPHAGOGASTRODUODENOSCOPY (EGD) WITH PROPOFOL;  Surgeon: Midge Minium, MD;  Location: [facility] ENDOSCOPY;  Service: Endoscopy;  Laterality: N/A;   EUS N/A 04/22/2021   Procedure: FULL UPPER ENDOSCOPIC ULTRASOUND (EUS) RADIAL;  Surgeon: Rayann Heman, MD;  Location: [facility] ENDOSCOPY;  Service: Endoscopy;  Laterality: N/A;  [facility] needed   EUS N/A 10/28/2021   Procedure: UPPER ENDOSCOPIC ULTRASOUND (EUS) LINEAR;  Surgeon: Doren Custard, MD;  Location:  [facility] ENDOSCOPY;  Service: Gastroenterology;  Laterality: N/A;  [facility]   IR RADIOLOGIST EVAL & MGMT  03/30/2023   transvaginal hysterectomy  04/18/05   with anterior colporrhaphy    Allergies: Paclitaxel  Medications: Prior to Admission medications   Medication Sig Start Date End Date Taking? Authorizing Provider  cholecalciferol (VITAMIN D3) 25 MCG (1000 UNIT) tablet Take 1,000 Units by mouth daily.    [provider]  ferrous sulfate 325 (65 FE) MG EC tablet Take 325 mg by mouth daily with breakfast.    [provider]  fluticasone (FLONASE) 50 MCG/ACT nasal spray Place 2 sprays into both nostrils daily. 05/11/20   Dale [place], MD  levocetirizine (XYZAL) 5 MG tablet Take 1 tablet (5 mg total) by mouth every evening. 05/11/20   Dale [place], MD  nystatin cream (MYCOSTATIN) Apply 1 Application topically 2 (two) times daily. 01/06/23   Dale [place], MD  olmesartan-hydrochlorothiazide (BENICAR HCT) 20-12.5 MG tablet TAKE 1 TABLET BY MOUTH ONCE DAILY 11/21/22   Dale [place], MD  pantoprazole (PROTONIX) 40 MG tablet Take 1 tablet (40 mg total) by mouth daily. 01/23/23   Dale [place], MD  prochlorperazine (COMPAZINE) 10 MG tablet Take 1 tablet (10 mg total) by mouth every 6 (six) hours as needed (Nausea or vomiting). Patient not taking: Reported on 09/29/2021 05/24/21 11/04/21  Earna Coder, MD     Family History  Problem Relation Age of Onset   Stroke Mother    Hypertension Mother    Prostate cancer Father    Cancer Father        prostate  Heart disease Brother        s/p CABG   Colon cancer Neg Hx     Social History   Socioeconomic History   Marital status: Widowed    Spouse name: Not on file   Number of children: 3   Years of education: Not on file   Highest education level: Not on file  Occupational History   Not on file  Tobacco Use   Smoking status: Former    Current packs/day: 0.00    Types: Cigarettes    Quit date: 06/13/1997     Years since quitting: 25.8   Smokeless tobacco: Never  Vaping Use   Vaping status: Never Used  Substance and Sexual Activity   Alcohol use: No    Alcohol/week: 0.0 standard drinks of alcohol   Drug use: No   Sexual activity: Not Currently  Other Topics Concern   Not on file  Social History Narrative   Lost her husband November 2019   Social Determinants of Health   Financial Resource Strain: Low Risk  (01/25/2021)   Overall Financial Resource Strain (CARDIA)    Difficulty of Paying Living Expenses: Not hard at all  Food Insecurity: No Food Insecurity (04/11/2022)   Hunger Vital Sign    Worried About Running Out of Food in the Last Year: Never true    Ran Out of Food in the Last Year: Never true  Transportation Needs: No Transportation Needs (04/11/2022)   PRAPARE - Administrator, Civil Service (Medical): No    Lack of Transportation (Non-Medical): No  Physical Activity: Unknown (01/25/2021)   Exercise Vital Sign    Days of Exercise per Week: 0 days    Minutes of Exercise per Session: Not on file  Stress: No Stress Concern Present (04/11/2022)   [employer] of Occupational Health - Occupational Stress Questionnaire    Feeling of Stress : Not at all  Social Connections: Socially Isolated (04/11/2022)   Social Connection and Isolation Panel [NHANES]    Frequency of Communication with Friends and Family: Never    Frequency of Social Gatherings with Friends and Family: Never    Attends Religious Services: Never    Database administrator or Organizations: No    Attends Banker Meetings: Never    Marital Status: Widowed    Code Status: Full code  Review of Systems: A 12 point ROS discussed and pertinent positives are indicated in the HPI above.  All other systems are negative.  Review of Systems  Constitutional:  Negative for chills and fever.  Respiratory:  Negative for chest tightness and shortness of breath.   Cardiovascular:  Negative for  chest pain and leg swelling.  Gastrointestinal:  Negative for abdominal pain, diarrhea, nausea and vomiting.  Neurological:  Negative for dizziness and headaches.  Psychiatric/Behavioral:  Negative for confusion.     Vital Signs: BP 136/70   Pulse 76   Temp 97.6 F (36.4 C) (Oral)   Resp 17   Ht 5\' 3"  (1.6 m)   Wt 135 lb (61.2 kg)   SpO2 97%   BMI 23.91 kg/m     Physical Exam Vitals reviewed.  Constitutional:      General: She is not in acute distress.    Appearance: She is not ill-appearing.  Eyes:     Pupils: Pupils are equal, round, and reactive to light.  Cardiovascular:     Rate and Rhythm: Normal rate and regular rhythm.     Pulses:  Normal pulses.     Heart sounds: Normal heart sounds.  Pulmonary:     Effort: Pulmonary effort is normal.     Breath sounds: Normal breath sounds.  Abdominal:     Palpations: Abdomen is soft.     Tenderness: There is no abdominal tenderness.  Musculoskeletal:     Right lower leg: No edema.     Left lower leg: No edema.  Skin:    General: Skin is warm and dry.  Neurological:     Mental Status: She is alert and oriented to person, place, and time.  Psychiatric:        Mood and Affect: Mood normal.        Behavior: Behavior normal.        Thought Content: Thought content normal.        Judgment: Judgment normal.     Imaging: DG Bone Density  Result Date: 04/03/2023 EXAM: DUAL X-RAY ABSORPTIOMETRY (DXA) FOR BONE MINERAL DENSITY IMPRESSION: Your patient Heather Cooper completed a BMD test on 04/03/2023 using the [employer] DXA System (software version: 14.10) manufactured by [employer]. The following summarizes the results of our evaluation. Technologist: SCE PATIENT BIOGRAPHICAL: Name: Heather Cooper, Heather Cooper Patient ID: [id number] Birth Date: [birth year] Height: 63.0 in. Gender: Female Exam Date: 04/03/2023 Weight: 136.7 lbs. Indications: Advanced Age, Genella Rife, History of Esphageal Cancer, History of Lung Cancer, Osteoporotic,  Postmenopausal, Previous Chemo and Radiation Fractures: Treatments: Protonix, Vitamin D DENSITOMETRY RESULTS: Site      Region     Measured Date Measured Age WHO Classification Young Adult T-score BMD         %Change vs. Previous Significant Change (*) AP Spine L1-L4 04/03/2023 90.2 Osteopenia -1.6 0.991 g/cm2 - - DualFemur Total Left 04/03/2023 90.2 Osteoporosis -3.1 0.612 g/cm2 - - ASSESSMENT: The BMD measured at Femur Total Left is 0.612 g/cm2 with a T-score of -3.1. This patient is considered osteoporotic according to World Health Organization [facility]) criteria. The scan quality is good. World Science writer [facility]) criteria for post-menopausal, Caucasian Women: Normal:                   T-score at or above -1 SD Osteopenia/low bone mass: T-score between -1 and -2.5 SD Osteoporosis:             T-score at or below -2.5 SD RECOMMENDATIONS: 1. All patients should optimize calcium and vitamin D intake. 2. Consider FDA-approved medical therapies in postmenopausal women and men aged 10 years and older, based on the following: a. A hip or vertebral(clinical or morphometric) fracture b. T-score < -2.5 at the femoral neck or spine after appropriate evaluation to exclude secondary causes c. Low bone mass (T-score between -1.0 and -2.5 at the femoral neck or spine) and a 10-year probability of a hip fracture > 3% or a 10-year probability of a major osteoporosis-related fracture > 20% based on the [place]-adapted WHO algorithm 3. Clinician judgment and/or patient preferences may indicate treatment for people with 10-year fracture probabilities above or below these levels FOLLOW-UP: People with diagnosed cases of osteoporosis or at high risk for fracture should have regular bone mineral density tests. For patients eligible for Medicare, routine testing is allowed once every 2 years. The testing frequency can be increased to one year for patients who have rapidly progressing disease, those who are receiving or discontinuing  medical therapy to restore bone mass, or have additional risk factors. I have reviewed this report, and agree with the above findings. [facility] Electronically Signed   By: Baird Lyons M.D.   On: 04/03/2023 09:39   IR Radiologist Eval & Mgmt  Result Date: 03/30/2023 EXAM: NEW PATIENT OFFICE VISIT CHIEF COMPLAINT: Refer to EMR HISTORY OF PRESENT ILLNESS: Patient was seen today in the interventional radiology clinic at the request of Dr. Donneta Romberg. Patient has a history of lung cancer in 2015 and esophageal cancer in 2022, both treated with courses of chemoradiation. Her most recent PET-CT on February 14, 2023 demonstrated progressive lucency and hypermetabolism of an L2 inferior endplate fracture, query possible pathologic involvement. The patient presents today with no complaints of back pain. She states that she occasionally had some back soreness which improved after changing her mattress. She is able to be mobile with a walker without any difficulty otherwise. REVIEW OF SYSTEMS: Refer to EMR PHYSICAL EXAMINATION: Refer to EMR ASSESSMENT AND PLAN: Refer to EMR Electronically Signed   By: Olive Bass M.D.   On: 03/30/2023 11:47    Labs:  CBC: Recent Labs    07/06/22 0826 09/13/22 0954 12/20/22 1256 03/21/23 0849  WBC 8.0 7.8 9.5 7.8  HGB 11.8* 12.1 12.3 12.3  HCT 36.3 36.4 36.3 36.5  PLT 261 239 258 258    COAGS: No results for input(s): "INR", "APTT" in the last 8760 hours.  BMP: Recent Labs    07/06/22 0826 09/13/22 0954 12/20/22 1256 03/21/23 0849  NA 138 137 137 137  K 4.0 3.9 4.4 4.2  CL 100 102 101 102  CO2 28 29 27 27   GLUCOSE 125* 110* 95 153*  BUN 16 21 23 22   CALCIUM 10.0 10.1 10.1 10.0  CREATININE 0.87 0.78 0.97 0.85  GFRNONAA >60 >60 56* >60    LIVER FUNCTION TESTS: Recent Labs    07/06/22 0826 09/13/22 0954 12/20/22 1256 03/21/23 0849  BILITOT 0.4 0.3 0.5 0.7  AST 17 18 16 16   ALT 10 10 11 11   ALKPHOS 71 74 74 69  PROT 7.3 7.6  7.9 7.8  ALBUMIN 3.8 4.1 4.3 4.2    TUMOR MARKERS: No results for input(s): "AFPTM", "CEA", "CA199", "CHROMGRNA" in the last 8760 hours.  Assessment and Plan:  Heather Cooper is a 89 yo female being seen today in relation to an L2 bone lesion. Patient is being followed by Dr Donneta Romberg for esophageal cancer and recent PET imaging showed a hypermetabolic lesion of the L2 vertebra. Patient was referred to IR for image-guided bone lesion biopsy. Patient met with Dr Juliette Alcide on 03/30/23 and was approved for image-guided L2 bone lesion biopsy. Patient presents today in her usual state of health and is NPO.  Risks and benefits of L2 bone lesion biopsy was discussed with the patient and/or patient's family including, but not limited to bleeding, infection, damage to adjacent structures or low yield requiring additional tests.  All of the questions were answered and there is agreement to proceed.  Consent signed and in chart.   Thank you for this interesting consult.  I greatly enjoyed meeting Heather Cooper and look forward to participating in their care.  A copy of this report was sent to the requesting provider on this date.  Electronically Signed: Kennieth Francois, PA-C 04/05/2023, 10:33 AM   I spent a total of  15 Minutes   in face to face in clinical consultation, greater than 50% of which was counseling/coordinating care for L2 bone lesion

## 2023-04-07 LAB — SURGICAL PATHOLOGY

## 2023-04-21 DIAGNOSIS — H903 Sensorineural hearing loss, bilateral: Secondary | ICD-10-CM | POA: Diagnosis not present

## 2023-05-02 ENCOUNTER — Inpatient Hospital Stay: Payer: Medicare PPO | Attending: Internal Medicine

## 2023-05-02 ENCOUNTER — Inpatient Hospital Stay (HOSPITAL_BASED_OUTPATIENT_CLINIC_OR_DEPARTMENT_OTHER): Payer: Medicare PPO | Admitting: Internal Medicine

## 2023-05-02 VITALS — BP 104/88 | HR 86 | Temp 97.9°F | Wt 136.6 lb

## 2023-05-02 DIAGNOSIS — Z85118 Personal history of other malignant neoplasm of bronchus and lung: Secondary | ICD-10-CM | POA: Insufficient documentation

## 2023-05-02 DIAGNOSIS — C154 Malignant neoplasm of middle third of esophagus: Secondary | ICD-10-CM | POA: Diagnosis not present

## 2023-05-02 DIAGNOSIS — C781 Secondary malignant neoplasm of mediastinum: Secondary | ICD-10-CM | POA: Insufficient documentation

## 2023-05-02 DIAGNOSIS — D490 Neoplasm of unspecified behavior of digestive system: Secondary | ICD-10-CM

## 2023-05-02 DIAGNOSIS — Z87891 Personal history of nicotine dependence: Secondary | ICD-10-CM | POA: Insufficient documentation

## 2023-05-02 LAB — CBC WITH DIFFERENTIAL (CANCER CENTER ONLY)
Abs Immature Granulocytes: 0.03 10*3/uL (ref 0.00–0.07)
Basophils Absolute: 0.1 10*3/uL (ref 0.0–0.1)
Basophils Relative: 1 %
Eosinophils Absolute: 0.3 10*3/uL (ref 0.0–0.5)
Eosinophils Relative: 4 %
HCT: 37.1 % (ref 36.0–46.0)
Hemoglobin: 12.2 g/dL (ref 12.0–15.0)
Immature Granulocytes: 0 %
Lymphocytes Relative: 31 %
Lymphs Abs: 2.4 10*3/uL (ref 0.7–4.0)
MCH: 29.7 pg (ref 26.0–34.0)
MCHC: 32.9 g/dL (ref 30.0–36.0)
MCV: 90.3 fL (ref 80.0–100.0)
Monocytes Absolute: 0.7 10*3/uL (ref 0.1–1.0)
Monocytes Relative: 9 %
Neutro Abs: 4.3 10*3/uL (ref 1.7–7.7)
Neutrophils Relative %: 55 %
Platelet Count: 238 10*3/uL (ref 150–400)
RBC: 4.11 MIL/uL (ref 3.87–5.11)
RDW: 12.8 % (ref 11.5–15.5)
WBC Count: 7.7 10*3/uL (ref 4.0–10.5)
nRBC: 0 % (ref 0.0–0.2)

## 2023-05-02 LAB — CMP (CANCER CENTER ONLY)
ALT: 12 U/L (ref 0–44)
AST: 16 U/L (ref 15–41)
Albumin: 4.2 g/dL (ref 3.5–5.0)
Alkaline Phosphatase: 74 U/L (ref 38–126)
Anion gap: 11 (ref 5–15)
BUN: 22 mg/dL (ref 8–23)
CO2: 26 mmol/L (ref 22–32)
Calcium: 10.2 mg/dL (ref 8.9–10.3)
Chloride: 103 mmol/L (ref 98–111)
Creatinine: 0.98 mg/dL (ref 0.44–1.00)
GFR, Estimated: 55 mL/min — ABNORMAL LOW (ref >60)
Glucose, Bld: 138 mg/dL — ABNORMAL HIGH (ref 70–99)
Potassium: 4.7 mmol/L (ref 3.5–5.1)
Sodium: 140 mmol/L (ref 135–145)
Total Bilirubin: 0.6 mg/dL (ref <1.2)
Total Protein: 7.9 g/dL (ref 6.5–8.1)

## 2023-05-02 NOTE — Progress Notes (Signed)
[facility] OFFICE PROGRESS NOTE  Patient Care Team: Dale Charlotte, MD as PCP - General (Internal Medicine) Benita Gutter, RN as Oncology Nurse Navigator Earna Coder, MD as Consulting Physician (Oncology) Midge Minium, MD as Consulting Physician (Gastroenterology) Borders, Daryl Eastern, NP as Nurse Practitioner (Hospice and Palliative Medicine) Keitha Butte, RN as Registered Nurse (Oncology)   Cancer Staging  Neoplasm of middle third of esophagus Staging form: Esophagus - Squamous Cell Carcinoma, AJCC 8th Edition - Clinical: Stage Unknown (cTX, cN1, cM0) - Signed by Earna Coder, MD on 05/10/2021 - Pathologic: No stage assigned - Unsigned   Oncology History Overview Note  # 2015-patient is an 89 year old female with 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  Starting radiation and chemotherapy from April 14, 2014 Patient was started on carboplatinum andTaxol Herve were developed an allergic reaction to Taxol so would be changed to Abraxane 3.patient has finished 6 cycles of weekly chemotherapy with carboplatinum  and radiation therapy(May 28, 2014) 4.started on  NIVOLULAMAB because of persistent disease July 02, 2014. 5.  NIVOLULAMAB was discontinued because of persistent diarrhea in July of 2016.  August of 2016 CT scan was stable so no further chemotherapy  # AUG 25th PET- STABLE RUL MASS [radiation fibrosis; <1cm ? 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   APRIL 16th, 2023- PET scan-  Resolution of metabolic activity in the mid esophagus;  Persistent hypermetabolic  activity within a subcarinal lymph node. Differential includes residual carcinoma versus reactive adenopathy.  No evidence of distant metastatic disease.  # MAY 18th, 2023- s/p EUS [Dr.Spaete]-endoscopic biopsy of the mediastinal lymph node. MAY 18th, 2023- Status post endoscopic ultrasound/biopsy of the mediastinal/subcarinal lymph node- BIOPSY POSITIVE FOR RECURRENT SQUAMOUS CELL.  # JUNE 9th, 2023- Opdivo every 2 weeks.status post Opdivoq 2 W x4 cycles- [held because of poor tolerance/diarrhea last July 23rd, 2023].  Currently on surveillance.  # SEP 3rd, 2024- PET scan-  Mild focal hypermetabolism associated with the distal esophagus, without a definitive CT correlate. Difficult to exclude disease recurrence; Increasing hypermetabolism associated with an L2 inferior endplate compression fracture. Additionally, lucency associated with the inferior endplate appears larger-see below.  Discussed the option of endoscopy now versus follow-up PET scan in about a month.  Patient interested in repeating the scan in a month and then decide if she needs to have an endoscopy.  I have informed Dr.Wohl.   # OCT 2024-Asymptomatic L2 lesion [concerning for recurrence versus compression fracture/osteoporosis]- s/p Bx- NEGATIVE for any malignancy.     # AAA/ 3.3x 3.9 Stable [Dr.Schnier] -----------------------------------------------------       History of lung cancer  Malignant neoplasm of right upper lobe of lung (HCC)  08/07/2019 Initial Diagnosis   Malignant neoplasm of right upper lobe of lung (HCC)    INTERVAL HISTORY: Ambulating with a rolling walker.  Accompanied by son; other daughter and son over the phone.  KENDRE ANSTEY 89 y.o.  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 Patient's Opdivo is currently on HOLD given the concern for  autoimmune colitis from immunotherapy is review results of  the bone biopsy.  Denies any difficulty swallowing.  Denies any nausea vomiting abdominal pain.  Denies any diarrhea. She is more  up and about.  Patient is  s/p physical therapy. No difficulty swallowing.   Review of Systems  Constitutional:  Positive for malaise/fatigue. Negative for chills, diaphoresis, fever and weight loss.  HENT:  Negative for nosebleeds and sore throat.   Eyes:  Negative for double vision.  Respiratory:  Negative for hemoptysis, sputum production, shortness of breath and wheezing.   Cardiovascular:  Negative for chest pain, palpitations, orthopnea and leg swelling.  Gastrointestinal:  Negative for abdominal pain, blood in stool, constipation, diarrhea, heartburn, melena, nausea and vomiting.  Genitourinary:  Negative for dysuria, frequency and urgency.  Musculoskeletal:  Negative for back pain and joint pain.  Skin: Negative.  Negative for itching and rash.  Neurological:  Negative for dizziness, tingling, focal weakness, weakness and headaches.  Endo/Heme/Allergies:  Does not bruise/bleed easily.  Psychiatric/Behavioral:  Negative for depression. The patient is not nervous/anxious and does not have insomnia.    PAST MEDICAL HISTORY :  Past Medical History:  Diagnosis Date   Chemotherapy induced nausea and vomiting    Chicken pox    Esophageal cancer (HCC)    Hypercalcemia    familial hypocalciuric hypercalcemia   Hypercholesterolemia    Hypertension    Lung cancer (HCC)    Osteoporosis    Thyroid disease     PAST SURGICAL HISTORY :   Past Surgical History:  Procedure Laterality Date   ABDOMINAL HYSTERECTOMY     partial   CHOLECYSTECTOMY     COLONOSCOPY WITH PROPOFOL N/A 04/17/2017   Procedure: COLONOSCOPY WITH PROPOFOL;  Surgeon: Scot Jun, MD;  Location: [facility] ENDOSCOPY;  Service: Endoscopy;  Laterality: N/A;   ESOPHAGOGASTRODUODENOSCOPY (EGD) WITH PROPOFOL N/A 04/13/2021   Procedure: ESOPHAGOGASTRODUODENOSCOPY (EGD) WITH PROPOFOL;  Surgeon:  Midge Minium, MD;  Location: [facility] ENDOSCOPY;  Service: Endoscopy;  Laterality: N/A;   EUS N/A 04/22/2021   Procedure: FULL UPPER ENDOSCOPIC ULTRASOUND (EUS) RADIAL;  Surgeon: Rayann Heman, MD;  Location: [facility] ENDOSCOPY;  Service: Endoscopy;  Laterality: N/A;  [facility] needed   EUS N/A 10/28/2021   Procedure: UPPER ENDOSCOPIC ULTRASOUND (EUS) LINEAR;  Surgeon: Doren Custard, MD;  Location: [facility] ENDOSCOPY;  Service: Gastroenterology;  Laterality: N/A;  [facility]   IR RADIOLOGIST EVAL & MGMT  03/30/2023   transvaginal hysterectomy  04/18/05   with anterior colporrhaphy    FAMILY HISTORY :   Family History  Problem Relation Age of Onset   Stroke Mother    Hypertension Mother    Prostate cancer Father    Cancer Father        prostate   Heart disease Brother        s/p CABG   Colon cancer Neg Hx     SOCIAL HISTORY:   Social History   Tobacco Use   Smoking status: Former    Current packs/day: 0.00    Types: Cigarettes    Quit date: 06/13/1997    Years since quitting: 25.9   Smokeless tobacco: Never  Vaping Use   Vaping status: Never Used  Substance Use Topics   Alcohol use: No    Alcohol/week: 0.0 standard drinks of alcohol   Drug use: No    ALLERGIES:  is allergic to paclitaxel.  MEDICATIONS:  Current Outpatient Medications  Medication Sig Dispense Refill   cholecalciferol (VITAMIN D3) 25 MCG (1000 UNIT)  tablet Take 1,000 Units by mouth daily.     ferrous sulfate 325 (65 FE) MG EC tablet Take 325 mg by mouth daily with breakfast.     fluticasone (FLONASE) 50 MCG/ACT nasal spray Place 2 sprays into both nostrils daily. 16 g 3   levocetirizine (XYZAL) 5 MG tablet Take 1 tablet (5 mg total) by mouth every evening. 90 tablet 2   nystatin cream (MYCOSTATIN) Apply 1 Application topically 2 (two) times daily. 30 g 0   olmesartan-hydrochlorothiazide (BENICAR HCT) 20-12.5 MG tablet TAKE 1 TABLET BY MOUTH ONCE DAILY 90 tablet 1   pantoprazole (PROTONIX) 40 MG tablet Take 1 tablet  (40 mg total) by mouth daily. 90 tablet 3   No current facility-administered medications for this visit.    PHYSICAL EXAMINATION: ECOG PERFORMANCE STATUS: 0 - Asymptomatic  BP 104/88 (BP Location: Left Arm, Patient Position: Sitting, Cuff Size: Large)   Pulse 86   Temp 97.9 F (36.6 C) (Oral)   Wt 136 lb 9.6 oz (62 kg)   SpO2 97%   BMI 24.20 kg/m   Filed Weights   05/02/23 0919  Weight: 136 lb 9.6 oz (62 kg)         Physical Exam HENT:     Head: Normocephalic and atraumatic.     Mouth/Throat:     Pharynx: No oropharyngeal exudate.  Eyes:     Pupils: Pupils are equal, round, and reactive to light.  Cardiovascular:     Rate and Rhythm: Normal rate and regular rhythm.  Pulmonary:     Effort: Pulmonary effort is normal. No respiratory distress.     Breath sounds: Normal breath sounds. No wheezing.  Abdominal:     General: Bowel sounds are normal. There is no distension.     Palpations: Abdomen is soft. There is no mass.     Tenderness: There is no abdominal tenderness. There is no guarding or rebound.  Musculoskeletal:        General: No tenderness. Normal range of motion.     Cervical back: Normal range of motion and neck supple.  Skin:    General: Skin is warm.  Neurological:     Mental Status: She is alert and oriented to person, place, and time.  Psychiatric:        Mood and Affect: Affect normal.      LABORATORY DATA:  I have reviewed the data as listed    Component Value Date/Time   NA 140 05/02/2023 0904   NA 130 (L) 10/09/2014 0846   K 4.7 05/02/2023 0904   K 4.1 10/09/2014 0846   CL 103 05/02/2023 0904   CL 98 (L) 10/09/2014 0846   CO2 26 05/02/2023 0904   CO2 25 10/09/2014 0846   GLUCOSE 138 (H) 05/02/2023 0904   GLUCOSE 161 (H) 10/09/2014 0846   BUN 22 05/02/2023 0904   BUN 14 10/09/2014 0846   CREATININE 0.98 05/02/2023 0904   CREATININE 0.70 10/09/2014 0846   CALCIUM 10.2 05/02/2023 0904   CALCIUM 9.1 10/09/2014 0846   PROT 7.9  05/02/2023 0904   PROT 7.3 10/09/2014 0846   ALBUMIN 4.2 05/02/2023 0904   ALBUMIN 3.6 10/09/2014 0846   AST 16 05/02/2023 0904   ALT 12 05/02/2023 0904   ALT 13 (L) 10/09/2014 0846   ALKPHOS 74 05/02/2023 0904   ALKPHOS 70 10/09/2014 0846   BILITOT 0.6 05/02/2023 0904   GFRNONAA 55 (L) 05/02/2023 0904   GFRNONAA >60 10/09/2014 0846   GFRAA >60 02/05/2020  1004   GFRAA >60 10/09/2014 0846    No results found for: "SPEP", "UPEP"  Lab Results  Component Value Date   WBC 7.7 05/02/2023   NEUTROABS 4.3 05/02/2023   HGB 12.2 05/02/2023   HCT 37.1 05/02/2023   MCV 90.3 05/02/2023   PLT 238 05/02/2023      Chemistry      Component Value Date/Time   NA 140 05/02/2023 0904   NA 130 (L) 10/09/2014 0846   K 4.7 05/02/2023 0904   K 4.1 10/09/2014 0846   CL 103 05/02/2023 0904   CL 98 (L) 10/09/2014 0846   CO2 26 05/02/2023 0904   CO2 25 10/09/2014 0846   BUN 22 05/02/2023 0904   BUN 14 10/09/2014 0846   CREATININE 0.98 05/02/2023 0904   CREATININE 0.70 10/09/2014 0846   GLU 111 03/18/2014 1048      Component Value Date/Time   CALCIUM 10.2 05/02/2023 0904   CALCIUM 9.1 10/09/2014 0846   ALKPHOS 74 05/02/2023 0904   ALKPHOS 70 10/09/2014 0846   AST 16 05/02/2023 0904   ALT 12 05/02/2023 0904   ALT 13 (L) 10/09/2014 0846   BILITOT 0.6 05/02/2023 0904       RADIOGRAPHIC STUDIES: I have personally reviewed the radiological images as listed and agreed with the findings in the report. No results found.    ASSESSMENT & PLAN:  Neoplasm of middle third of esophagus # Squamous cell carcinoma of the mid-esophagus [NOV 2022]-  locally advanced; RECURRENT [s/p EUS- LB Bx-] MAY 18th, 2023-most recently status post Opdivoq 2 W x4 cycles- [held because of poor tolerance/diarrhea last July 23rd, 2023].  Currently on surveillance.  # SEP 3rd, 2024- PET scan-  Mild focal hypermetabolism associated with the distal esophagus, without a definitive CT correlate. Difficult to exclude  disease recurrence; Increasing hypermetabolism associated with an L2 inferior endplate compression fracture. Additionally, lucency associated with the inferior endplate appears larger-see below.  Discussed the option of endoscopy now versus follow-up PET scan in about a month.  Patient interested in repeating the scan in a month and then decide if she needs to have an endoscopy.  I have informed Dr.Wohl.   # Asymptomatic L2 lesion [concerning for recurrence versus compression fracture/osteoporosis]- s/p Bx- NEGATIVE for any malignancy.    # Continue to hold Opdivo at this time given patient's patient's colitis on immunotherapy-continue surveillance.  # OCT 2024- BMD T score: -3.1-/L2 compression fracture discussed the potential risk factors for osteoporosis- age/gender/postmenopausal status/chemotherapy etc.  Discussed multiple options including exercise/ calcium and vitamin D supplementation/ and also use of bisphosphonates. Discussed oral bisphosphonates [pt has reflux] versus parenteral bisphosphonate like Reclast. Discussed the potential benefits and/side effects  Including but not limited to Osteonecrosis of jaw/ hypocalcemia.  Patient currently on vitamin D 2000 units.  Recommend adding calcium 1000 mg.  Patient has dentures- recommended dental clearance.  # Autoimmune colitis/Opdivo induced diarrhea-s/p evaluation with GI; Dr.Wohl- improved- currently  OFF prednisone -monitor for now- stable.  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  # Mild  Anemia- improved- Hb 12.3 stable; continue gentle iron- space to every other day- stable.  # PBF-BG- 150  Monitor for now [Gluerna protein shakes]- continue OFF prednisone as above.Stable.  #Hx of goitre-/ TSH low-however normal free T3-T4- .currently on surveillance s/p evaluation with  Dr. Evlyn Kanner, [facility] endocrinology. Ordered labs per endo. Stable.   # Vaccinations: ok with Flu shot; and Covid-19.; ok with RSV  #  infrarenal abdominal aortic aneurysm including at 5.0 x 4.6 cm.[ Dr.scheiner]- stable.   # IV access: PIV.   # handicap: permit given.   # DISPOSITION: # follow up in 1 months- MD; labs- cbc/cmp; Reclast; PET scan- Dr.B    Orders Placed This Encounter  Procedures   NM PET Image Restage (PS) Skull Base to Thigh (F-18 FDG)    Standing Status:   Future    Standing Expiration Date:   05/01/2024    Order Specific Question:   If indicated for the ordered procedure, I authorize the administration of a radiopharmaceutical per Radiology protocol    Answer:   Yes    Order Specific Question:   Preferred imaging location?    Answer:   [facility]   CBC with Differential (Cancer Center Only)    Standing Status:   Future    Standing Expiration Date:   05/01/2024   CMP (Cancer Center only)    Standing Status:   Future    Standing Expiration Date:   05/01/2024    All questions were answered. The patient knows to call the clinic with any problems, questions or concerns.      Earna Coder, MD 05/02/2023 10:50 AM

## 2023-05-02 NOTE — Progress Notes (Signed)
Patient had a biopsy on 04/05/2023, and would like to go over the results with the doctor today.

## 2023-05-02 NOTE — Assessment & Plan Note (Addendum)
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  Currently on surveillance.  # SEP 3rd, 2024- PET scan-  Mild focal hypermetabolism associated with the distal esophagus, without a definitive CT correlate. Difficult to exclude disease recurrence; Increasing hypermetabolism associated with an L2 inferior endplate compression fracture. Additionally, lucency associated with the inferior endplate appears larger-see below.  Discussed the option of endoscopy now versus follow-up PET scan in about a month.  Patient interested in repeating the scan in a month and then decide if she needs to have an endoscopy.  I have informed Dr.Wohl.   # Asymptomatic L2 lesion [concerning for recurrence versus compression fracture/osteoporosis]- s/p Bx- NEGATIVE for any malignancy.    # Continue to hold Opdivo at this time given patient's patient's colitis on immunotherapy-continue surveillance.  # OCT 2024- BMD T score: -3.1-/L2 compression fracture discussed the potential risk factors for osteoporosis- age/gender/postmenopausal status/chemotherapy etc.  Discussed multiple options including exercise/ calcium and vitamin D supplementation/ and also use of bisphosphonates. Discussed oral bisphosphonates [pt has reflux] versus parenteral bisphosphonate like Reclast. Discussed the potential benefits and/side effects  Including but not limited to Osteonecrosis of jaw/ hypocalcemia.  Patient currently on vitamin D 2000 units.  Recommend adding calcium 1000 mg.  Patient has dentures- recommended dental clearance.  # Autoimmune colitis/Opdivo induced diarrhea-s/p evaluation with GI; Dr.Wohl- improved- currently  OFF prednisone -monitor for now- stable.  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  # Mild  Anemia- improved-  Hb 12.3 stable; continue gentle iron- space to every other day- stable.  # PBF-BG- 150  Monitor for now [Gluerna protein shakes]- continue OFF prednisone as above.Stable.  #Hx of goitre-/ TSH low-however normal free T3-T4- .currently on surveillance s/p evaluation with  Dr. Evlyn Kanner, [facility] endocrinology. Ordered labs per endo. Stable.   # Vaccinations: ok with Flu shot; and Covid-19.; ok with RSV  #  infrarenal abdominal aortic aneurysm including at 5.0 x 4.6 cm.[ Dr.scheiner]- stable.   # IV access: PIV.   # handicap: permit given.   # DISPOSITION: # follow up in 1 months- MD; labs- cbc/cmp; Reclast; PET scan- Dr.B

## 2023-05-10 ENCOUNTER — Ambulatory Visit: Payer: Medicare PPO | Admitting: Internal Medicine

## 2023-05-16 ENCOUNTER — Other Ambulatory Visit: Payer: Self-pay | Admitting: *Deleted

## 2023-05-16 ENCOUNTER — Ambulatory Visit (INDEPENDENT_AMBULATORY_CARE_PROVIDER_SITE_OTHER): Payer: Medicare PPO | Admitting: Internal Medicine

## 2023-05-16 VITALS — BP 126/70 | HR 86 | Temp 98.0°F | Resp 16 | Ht 63.0 in | Wt 138.6 lb

## 2023-05-16 DIAGNOSIS — M810 Age-related osteoporosis without current pathological fracture: Secondary | ICD-10-CM

## 2023-05-16 DIAGNOSIS — C3411 Malignant neoplasm of upper lobe, right bronchus or lung: Secondary | ICD-10-CM | POA: Diagnosis not present

## 2023-05-16 DIAGNOSIS — I10 Essential (primary) hypertension: Secondary | ICD-10-CM

## 2023-05-16 DIAGNOSIS — C159 Malignant neoplasm of esophagus, unspecified: Secondary | ICD-10-CM | POA: Diagnosis not present

## 2023-05-16 DIAGNOSIS — I739 Peripheral vascular disease, unspecified: Secondary | ICD-10-CM | POA: Diagnosis not present

## 2023-05-16 DIAGNOSIS — R7989 Other specified abnormal findings of blood chemistry: Secondary | ICD-10-CM

## 2023-05-16 DIAGNOSIS — E1165 Type 2 diabetes mellitus with hyperglycemia: Secondary | ICD-10-CM | POA: Diagnosis not present

## 2023-05-16 DIAGNOSIS — K219 Gastro-esophageal reflux disease without esophagitis: Secondary | ICD-10-CM

## 2023-05-16 DIAGNOSIS — E7800 Pure hypercholesterolemia, unspecified: Secondary | ICD-10-CM

## 2023-05-16 DIAGNOSIS — I714 Abdominal aortic aneurysm, without rupture, unspecified: Secondary | ICD-10-CM

## 2023-05-16 DIAGNOSIS — J439 Emphysema, unspecified: Secondary | ICD-10-CM

## 2023-05-16 NOTE — Progress Notes (Signed)
Subjective:    Patient ID: Heather Cooper, female    DOB: [birth year], 89 y.o.   MRN: [id number]  Patient here for  Chief Complaint  Patient presents with   Medical Management of Chronic Issues    HPI Here for a scheduled follow up - follow up regarding GERD, diabetes, hypercholesterolemia, hyperglycemia and hypertension. 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 status post Opdivoq - currently on hold for poor tolerance/diarrhea. Recent scan - difficult to exclude disease recurrence.  Elected to f/u with PET scan in one month. She is scheduled for PET scan next week.  Eating well. Denies problems swallowing.  Reports a good appetite. No chest pain or sob reported. Breathing stable. No abdominal pain or bowel change reported.    Past Medical History:  Diagnosis Date   Chemotherapy induced nausea and vomiting    Chicken pox    Esophageal cancer (HCC)    Hypercalcemia    familial hypocalciuric hypercalcemia   Hypercholesterolemia    Hypertension    Lung cancer (HCC)    Osteoporosis    Thyroid disease    Past Surgical History:  Procedure Laterality Date   ABDOMINAL HYSTERECTOMY     partial   CHOLECYSTECTOMY     COLONOSCOPY WITH PROPOFOL N/A 04/17/2017   Procedure: COLONOSCOPY WITH PROPOFOL;  Surgeon: Scot Jun, MD;  Location: [facility] ENDOSCOPY;  Service: Endoscopy;  Laterality: N/A;   ESOPHAGOGASTRODUODENOSCOPY (EGD) WITH PROPOFOL N/A 04/13/2021   Procedure: ESOPHAGOGASTRODUODENOSCOPY (EGD) WITH PROPOFOL;  Surgeon: Midge Minium, MD;  Location: [facility] ENDOSCOPY;  Service: Endoscopy;  Laterality: N/A;   EUS N/A 04/22/2021   Procedure: FULL UPPER ENDOSCOPIC ULTRASOUND (EUS) RADIAL;  Surgeon: Rayann Heman, MD;  Location: [facility] ENDOSCOPY;  Service: Endoscopy;  Laterality: N/A;  [facility] needed   EUS N/A 10/28/2021   Procedure: UPPER ENDOSCOPIC ULTRASOUND (EUS) LINEAR;  Surgeon: Doren Custard, MD;  Location: [facility] ENDOSCOPY;  Service: Gastroenterology;  Laterality: N/A;  [facility]   IR RADIOLOGIST EVAL & MGMT  03/30/2023   transvaginal hysterectomy  04/18/05   with anterior colporrhaphy   Family History  Problem Relation Age of Onset   Stroke Mother    Hypertension Mother    Prostate cancer Father    Cancer Father        prostate   Heart disease Brother        s/p CABG   Colon cancer Neg Hx    Social History   Socioeconomic History   Marital status: Widowed    Spouse name: Not on file   Number of children: 3   Years of education: Not on file   Highest education level: Not on file  Occupational History   Not on file  Tobacco Use   Smoking status: Former    Current packs/day: 0.00    Types: Cigarettes    Quit date: 06/13/1997    Years since quitting: 25.9   Smokeless tobacco: Never  Vaping Use   Vaping status: Never Used  Substance and Sexual Activity   Alcohol use: No    Alcohol/week: 0.0 standard drinks of alcohol   Drug use: No   Sexual activity: Not Currently  Other Topics Concern   Not on file  Social History Narrative   Lost her husband November 2019   Social Determinants of Health   Financial Resource Strain: Low Risk  (01/25/2021)   Overall Financial Resource Strain (CARDIA)  Difficulty of Paying Living Expenses: Not hard at all  Food Insecurity: No Food Insecurity (04/11/2022)   Hunger Vital Sign    Worried About Running Out of Food in the Last Year: Never true    Ran Out of Food in the Last Year: Never true  Transportation Needs: No Transportation Needs (04/11/2022)   PRAPARE - Administrator, Civil Service (Medical): No    Lack of Transportation (Non-Medical): No  Physical Activity: Unknown (01/25/2021)   Exercise Vital Sign    Days of Exercise per Week: 0 days    Minutes of Exercise per Session: Not on file  Stress: No Stress Concern Present (04/11/2022)   [employer] of Occupational Health - Occupational Stress  Questionnaire    Feeling of Stress : Not at all  Social Connections: Socially Isolated (04/11/2022)   Social Connection and Isolation Panel [NHANES]    Frequency of Communication with Friends and Family: Never    Frequency of Social Gatherings with Friends and Family: Never    Attends Religious Services: Never    Database administrator or Organizations: No    Attends Banker Meetings: Never    Marital Status: Widowed     Review of Systems  Constitutional:  Negative for appetite change and unexpected weight change.  HENT:  Negative for congestion and sinus pressure.   Respiratory:  Negative for cough, chest tightness and shortness of breath.   Cardiovascular:  Negative for chest pain and palpitations.  Gastrointestinal:  Negative for abdominal pain, diarrhea, nausea and vomiting.  Genitourinary:  Negative for difficulty urinating and dysuria.  Musculoskeletal:  Negative for joint swelling and myalgias.  Skin:  Negative for color change and rash.  Neurological:  Negative for dizziness and headaches.  Psychiatric/Behavioral:  Negative for agitation and dysphoric mood.        Objective:     BP 126/70   Pulse 86   Temp 98 F (36.7 C)   Resp 16   Ht 5\' 3"  (1.6 m)   Wt 138 lb 9.6 oz (62.9 kg)   SpO2 99%   BMI 24.55 kg/m  Wt Readings from Last 3 Encounters:  05/16/23 138 lb 9.6 oz (62.9 kg)  05/02/23 136 lb 9.6 oz (62 kg)  04/05/23 135 lb (61.2 kg)    Physical Exam Vitals reviewed.  Constitutional:      General: She is not in acute distress.    Appearance: Normal appearance.  HENT:     Head: Normocephalic and atraumatic.     Right Ear: External ear normal.     Left Ear: External ear normal.  Eyes:     General: No scleral icterus.       Right eye: No discharge.        Left eye: No discharge.     Conjunctiva/sclera: Conjunctivae normal.  Neck:     Thyroid: No thyromegaly.  Cardiovascular:     Rate and Rhythm: Normal rate and regular rhythm.   Pulmonary:     Effort: No respiratory distress.     Breath sounds: Normal breath sounds. No wheezing.  Abdominal:     General: Bowel sounds are normal.     Palpations: Abdomen is soft.     Tenderness: There is no abdominal tenderness.  Musculoskeletal:        General: No swelling or tenderness.     Cervical back: Neck supple. No tenderness.  Lymphadenopathy:     Cervical: No cervical adenopathy.  Skin:  Findings: No erythema or rash.  Neurological:     Mental Status: She is alert.  Psychiatric:        Mood and Affect: Mood normal.        Behavior: Behavior normal.      Outpatient Encounter Medications as of 05/16/2023  Medication Sig   cholecalciferol (VITAMIN D3) 25 MCG (1000 UNIT) tablet Take 1,000 Units by mouth daily.   ferrous sulfate 325 (65 FE) MG EC tablet Take 325 mg by mouth daily with breakfast.   fluticasone (FLONASE) 50 MCG/ACT nasal spray Place 2 sprays into both nostrils daily.   levocetirizine (XYZAL) 5 MG tablet Take 1 tablet (5 mg total) by mouth every evening.   nystatin cream (MYCOSTATIN) Apply 1 Application topically 2 (two) times daily.   olmesartan-hydrochlorothiazide (BENICAR HCT) 20-12.5 MG tablet TAKE 1 TABLET BY MOUTH ONCE DAILY   pantoprazole (PROTONIX) 40 MG tablet Take 1 tablet (40 mg total) by mouth daily.   [DISCONTINUED] prochlorperazine (COMPAZINE) 10 MG tablet Take 1 tablet (10 mg total) by mouth every 6 (six) hours as needed (Nausea or vomiting). (Patient not taking: Reported on 09/29/2021)   No facility-administered encounter medications on file as of 05/16/2023.     Lab Results  Component Value Date   WBC 7.7 05/02/2023   HGB 12.2 05/02/2023   HCT 37.1 05/02/2023   PLT 238 05/02/2023   GLUCOSE 138 (H) 05/02/2023   CHOL 171 08/26/2021   TRIG 182.0 (H) 08/26/2021   HDL 39.10 08/26/2021   LDLCALC 96 08/26/2021   ALT 12 05/02/2023   AST 16 05/02/2023   NA 140 05/02/2023   K 4.7 05/02/2023   CL 103 05/02/2023   CREATININE 0.98  05/02/2023   BUN 22 05/02/2023   CO2 26 05/02/2023   TSH 0.778 07/06/2022   INR 1.0 04/05/2023   HGBA1C 5.5 08/26/2021   MICROALBUR 5.2 (H) 01/19/2021    CT BONE TROCAR/NEEDLE BIOPSY DEEP  Result Date: 04/05/2023 INDICATION: L2 bone lesion EXAM: CT-guided core needle biopsy of L2 vertebral body lesion MEDICATIONS: None. ANESTHESIA/SEDATION: Moderate (conscious) sedation was employed during this procedure. A total of Versed 1 mg and Fentanyl 50 mcg was administered intravenously. Moderate Sedation Time: 20 minutes. The patient's level of consciousness and vital signs were monitored continuously by radiology nursing throughout the procedure under my direct supervision. FLUOROSCOPY TIME:  N/a COMPLICATIONS: None immediate. PROCEDURE: Informed written consent was obtained from the patient after a thorough discussion of the procedural risks, benefits and alternatives. All questions were addressed. Maximal Sterile Barrier Technique was utilized including caps, mask, sterile gowns, sterile gloves, sterile drape, hand hygiene and skin antiseptic. A timeout was performed prior to the initiation of the procedure. The patient was placed prone on the exam table. Limited CT of the abdomen and pelvis was performed for planning purposes. This demonstrated lytic lesion located in the L2 vertebral body. Skin entry site was marked, and the overlying skin was prepped and draped in the standard sterile fashion. Local analgesia was obtained with 1% lidocaine. Using intermittent CT fluoroscopy, an 11 gauge bone biopsy needle was advanced using a left transpedicular approach. A single core pass was obtained, with CT confirming needle traversing through the lesion. Specimens were submitted in formalin to pathology for further handling. Limited postprocedure imaging demonstrated no complicating feature. The patient tolerated the procedure well, and was transferred to recovery in stable condition. IMPRESSION: Successful CT-guided  core needle biopsy of an L2 vertebral body lesion using a transpedicular approach. Electronically Signed  By: Olive Bass M.D.   On: 04/05/2023 13:45       Assessment & Plan:  Primary hypertension Assessment & Plan: Continue benicar/hctz.  Blood pressure has been doing well.   Follow pressures.  Follow metabolic panel.    Hypercholesterolemia Assessment & Plan: Follow lipid panel.   Orders: -     TSH; Future  Type 2 diabetes mellitus with hyperglycemia, without long-term current use of insulin (HCC) Assessment & Plan: Follow met b and a1c. Off prednisone.    Low TSH level Assessment & Plan: Has been followed by Dr Evlyn Kanner.    Orders: -     TSH; Future -     T4, free; Future -     T3, free; Future  Osteoporosis without current pathological fracture, unspecified osteoporosis type -     VITAMIN D 25 Hydroxy (Vit-D Deficiency, Fractures); Future  PAD (peripheral artery disease) (HCC) Assessment & Plan: Not on cholesterol medication.  Continue blood pressure control.    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 status post Opdivoq - currently on hold for poor tolerance/diarrhea. Recent scan - difficult to exclude disease recurrence.  Elected to f/u with PET scan in one month. She is scheduled for PET scan next week.    Gastroesophageal reflux disease, unspecified whether esophagitis present Assessment & Plan: Continues on protonix.     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 status post Opdivoq - currently on hold for poor tolerance/diarrhea. Recent scan - difficult to exclude disease recurrence.  Elected to f/u with PET scan  in one month. She is scheduled for PET scan next week.    Pulmonary emphysema, unspecified emphysema type (HCC) Assessment & Plan: Breathing stable.    Abdominal aortic aneurysm (AAA) without rupture, unspecified part [facility]) Assessment & Plan: Appt [facility] 05/2022 - stable.  Recommended f/u in 12 months.       Dale Plumwood, MD

## 2023-05-17 ENCOUNTER — Ambulatory Visit
Admission: RE | Admit: 2023-05-17 | Discharge: 2023-05-17 | Disposition: A | Discharge status: Home / Self Care | Payer: TRICARE For Life (TFL) | Source: Ambulatory Visit | Attending: Internal Medicine | Admitting: Internal Medicine

## 2023-05-17 ENCOUNTER — Other Ambulatory Visit: Payer: Self-pay

## 2023-05-17 DIAGNOSIS — D490 Neoplasm of unspecified behavior of digestive system: Secondary | ICD-10-CM | POA: Diagnosis not present

## 2023-05-17 DIAGNOSIS — I7143 Infrarenal abdominal aortic aneurysm, without rupture: Secondary | ICD-10-CM | POA: Insufficient documentation

## 2023-05-17 DIAGNOSIS — K5730 Diverticulosis of large intestine without perforation or abscess without bleeding: Secondary | ICD-10-CM | POA: Diagnosis not present

## 2023-05-17 DIAGNOSIS — I700 Atherosclerosis of aorta: Secondary | ICD-10-CM | POA: Diagnosis not present

## 2023-05-17 DIAGNOSIS — C159 Malignant neoplasm of esophagus, unspecified: Secondary | ICD-10-CM | POA: Diagnosis not present

## 2023-05-17 LAB — GLUCOSE, CAPILLARY: Glucose-Capillary: 106 mg/dL — ABNORMAL HIGH (ref 70–99)

## 2023-05-17 MED ORDER — FLUDEOXYGLUCOSE F - 18 (FDG) INJECTION
7.5800 | Freq: Once | INTRAVENOUS | Status: AC | PRN
  Administered 2023-05-17: 7.58 via INTRAVENOUS

## 2023-05-20 ENCOUNTER — Encounter: Payer: Self-pay | Admitting: Internal Medicine

## 2023-05-20 NOTE — Assessment & Plan Note (Signed)
Not on cholesterol medication.  Continue blood pressure control.

## 2023-05-20 NOTE — Assessment & Plan Note (Signed)
Follow lipid panel.   

## 2023-05-20 NOTE — Assessment & Plan Note (Signed)
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 status post Opdivoq - currently on hold for poor tolerance/diarrhea. Recent scan - difficult to exclude disease recurrence.  Elected to f/u with PET scan in one month. She is scheduled for PET scan next week.

## 2023-05-20 NOTE — Assessment & Plan Note (Signed)
Appt [facility] 05/2022 - stable.  Recommended f/u in 12 months.

## 2023-05-20 NOTE — Assessment & Plan Note (Signed)
Continues on protonix.  ?

## 2023-05-20 NOTE — Assessment & Plan Note (Signed)
Breathing stable.

## 2023-05-20 NOTE — Assessment & Plan Note (Signed)
Continue benicar/hctz.  Blood pressure has been doing well.   Follow pressures.  Follow metabolic panel.

## 2023-05-20 NOTE — Assessment & Plan Note (Signed)
Follow met b and a1c. Off prednisone.

## 2023-05-20 NOTE — Assessment & Plan Note (Signed)
Has been followed by Dr Evlyn Kanner.

## 2023-05-22 ENCOUNTER — Ambulatory Visit: Payer: TRICARE For Life (TFL)

## 2023-05-24 ENCOUNTER — Other Ambulatory Visit (INDEPENDENT_AMBULATORY_CARE_PROVIDER_SITE_OTHER): Payer: Self-pay | Admitting: Vascular Surgery

## 2023-05-24 DIAGNOSIS — I714 Abdominal aortic aneurysm, without rupture, unspecified: Secondary | ICD-10-CM

## 2023-05-24 NOTE — Progress Notes (Unsigned)
MRN : [id number]  Heather Cooper is a 89 y.o. (04/27/1931) female who presents with chief complaint of check circulation.  History of Present Illness:   The patient returns to the office for surveillance of a known abdominal aortic aneurysm. Patient denies abdominal pain or back pain, no other abdominal complaints. No changes suggesting embolic episodes.    There have been no interval changes in the patient's overall health care since his last visit.   Patient denies amaurosis fugax or TIA symptoms. There is no history of claudication or rest pain symptoms of the lower extremities. The patient denies angina or shortness of breath.    Duplex US of the aorta and iliac arteries shows an AAA measured 4.8 cm.  No significant change compared to the study last year which was 4.7 cm.   No outpatient medications have been marked as taking for the 05/25/23 encounter (Appointment) with Gilda Crease, Latina Craver, MD.    Past Medical History:  Diagnosis Date   Chemotherapy induced nausea and vomiting    Chicken pox    Esophageal cancer (HCC)    Hypercalcemia    familial hypocalciuric hypercalcemia   Hypercholesterolemia    Hypertension    Lung cancer (HCC)    Osteoporosis    Thyroid disease     Past Surgical History:  Procedure Laterality Date   ABDOMINAL HYSTERECTOMY     partial   CHOLECYSTECTOMY     COLONOSCOPY WITH PROPOFOL N/A 04/17/2017   Procedure: COLONOSCOPY WITH PROPOFOL;  Surgeon: Scot Jun, MD;  Location: [facility] ENDOSCOPY;  Service: Endoscopy;  Laterality: N/A;   ESOPHAGOGASTRODUODENOSCOPY (EGD) WITH PROPOFOL N/A 04/13/2021   Procedure: ESOPHAGOGASTRODUODENOSCOPY (EGD) WITH PROPOFOL;  Surgeon: Midge Minium, MD;  Location: [facility] ENDOSCOPY;  Service: Endoscopy;  Laterality: N/A;   EUS N/A 04/22/2021   Procedure: FULL UPPER ENDOSCOPIC ULTRASOUND (EUS) RADIAL;  Surgeon: Rayann Heman, MD;  Location: [facility]  ENDOSCOPY;  Service: Endoscopy;  Laterality: N/A;  [facility] needed   EUS N/A 10/28/2021   Procedure: UPPER ENDOSCOPIC ULTRASOUND (EUS) LINEAR;  Surgeon: Doren Custard, MD;  Location: [facility] ENDOSCOPY;  Service: Gastroenterology;  Laterality: N/A;  [facility]   IR RADIOLOGIST EVAL & MGMT  03/30/2023   transvaginal hysterectomy  04/18/05   with anterior colporrhaphy    Social History Social History   Tobacco Use   Smoking status: Former    Current packs/day: 0.00    Types: Cigarettes    Quit date: 06/13/1997    Years since quitting: 25.9   Smokeless tobacco: Never  Vaping Use   Vaping status: Never Used  Substance Use Topics   Alcohol use: No    Alcohol/week: 0.0 standard drinks of alcohol   Drug use: No    Family History Family History  Problem Relation Age of Onset   Stroke Mother    Hypertension Mother    Prostate cancer Father    Cancer Father        prostate   Heart disease Brother        s/p CABG   Colon cancer Neg Hx     Allergies  Allergen Reactions   Paclitaxel Other (See Comments)    Chest tightness     REVIEW OF SYSTEMS (Negative unless checked)  Constitutional: [] Weight loss  [] Fever  [] Chills Cardiac: [] Chest pain   [] Chest pressure   [] Palpitations   [] Shortness of breath when laying flat   [] Shortness of breath with exertion. Vascular:  [x] Pain in legs with walking   [] Pain in legs at rest  [] History of DVT   [] Phlebitis   [] Swelling in legs   [] Varicose veins   [] Non-healing ulcers Pulmonary:   [] Uses home oxygen   [] Productive cough   [] Hemoptysis   [] Wheeze  [] COPD   [] Asthma Neurologic:  [] Dizziness   [] Seizures   [] History of stroke   [] History of TIA  [] Aphasia   [] Vissual changes   [] Weakness or numbness in arm   [] Weakness or numbness in leg Musculoskeletal:   [] Joint swelling   [] Joint pain   [] Low back pain Hematologic:  [] Easy bruising  [] Easy bleeding   [] Hypercoagulable state   [] Anemic Gastrointestinal:  [] Diarrhea   [] Vomiting   [x] Gastroesophageal reflux/heartburn   [] Difficulty swallowing. Genitourinary:  [] Chronic kidney disease   [] Difficult urination  [] Frequent urination   [] Blood in urine Skin:  [] Rashes   [] Ulcers  Psychological:  [] History of anxiety   []  History of major depression.  Physical Examination  There were no vitals filed for this visit. There is no height or weight on file to calculate BMI. Gen: WD/WN, NAD Head: [place]/AT, No temporalis wasting.  Ear/Nose/Throat: Hearing grossly intact, nares w/o erythema or drainage Eyes: PER, EOMI, sclera nonicteric.  Neck: Supple, no masses.  No bruit or JVD.  Pulmonary:  Good air movement, no audible wheezing, no use of accessory muscles.  Cardiac: RRR, normal S1, S2, no Murmurs. Vascular:  mild trophic changes, no open wounds Vessel Right Left  Radial Palpable Palpable  Gastrointestinal: soft, non-distended. No guarding/no peritoneal signs.  Musculoskeletal: M/S 5/5 throughout.  No visible deformity.  Neurologic: CN 2-12 intact. Pain and light touch intact in extremities.  Symmetrical.  Speech is fluent. Motor exam as listed above. Psychiatric: Judgment intact, Mood & affect appropriate for pt's clinical situation. Dermatologic: No rashes or ulcers noted.  No changes consistent with cellulitis.   CBC Lab Results  Component Value Date   WBC 7.7 05/02/2023   HGB 12.2 05/02/2023   HCT 37.1 05/02/2023   MCV 90.3 05/02/2023   PLT 238 05/02/2023    BMET    Component Value Date/Time   NA 140 05/02/2023 0904   NA 130 (L) 10/09/2014 0846   K 4.7 05/02/2023 0904   K 4.1 10/09/2014 0846   CL 103 05/02/2023 0904   CL 98 (L) 10/09/2014 0846   CO2 26 05/02/2023 0904   CO2 25 10/09/2014 0846   GLUCOSE 138 (H) 05/02/2023 0904   GLUCOSE 161 (H) 10/09/2014 0846   BUN 22 05/02/2023 0904   BUN 14 10/09/2014 0846   CREATININE 0.98 05/02/2023 0904   CREATININE 0.70 10/09/2014 0846   CALCIUM 10.2 05/02/2023 0904   CALCIUM 9.1 10/09/2014 0846   GFRNONAA  55 (L) 05/02/2023 0904   GFRNONAA >60 10/09/2014 0846   GFRAA >60 02/05/2020 1004   GFRAA >60 10/09/2014 0846   CrCl cannot be calculated (Patient's most recent lab result is older than the maximum 21 days allowed.).  [facility]AG Lab Results  Component Value Date   INR 1.0 04/05/2023   INR 1.0 03/18/2014   INR 1.0 03/18/2014   PROTIME 13.2 03/18/2014  Radiology No results found.   Assessment/Plan 1. Abdominal aortic aneurysm (AAA) without rupture, unspecified part (HCC) (Primary) Recommend: No surgery or intervention is indicated at this time.   The patient has an asymptomatic abdominal aortic aneurysm that is greater than 4 cm but less than 5 cm in maximal diameter.     I have reviewed the natural history of abdominal aortic aneurysm and the small risk of rupture for aneurysm less than 5 cm in size.  However, as these small aneurysms tend to enlarge over time, continued surveillance with ultrasound or CT scan is mandatory.    I have also discussed optimizing medical management with hypertension and lipid control and the importance of abstinence from tobacco.  The patient is also encouraged to exercise a minimum of 30 minutes 4 times a week.    Should the patient develop new onset abdominal or back pain or signs of peripheral embolization they are instructed to seek medical attention immediately and to alert the physician providing care that they have an aneurysm.    The patient voices their understanding.   Given the long term stability of her AAA I have scheduled the patient to return in 12 months with an aortic duplex.   - VAS US AORTA/IVC/ILIACS; Future  2. PAD (peripheral artery disease) (HCC) Recommend:   The patient has evidence of atherosclerosis of the lower extremities with claudication.  The patient does not voice lifestyle limiting changes at this point in time.   Noninvasive studies do not suggest clinically significant change.   No invasive studies, angiography  or surgery at this time The patient should continue walking and begin a more formal exercise program.  The patient should continue antiplatelet therapy and aggressive treatment of the lipid abnormalities   No changes in the patient's medications at this time   Continued surveillance is indicated as atherosclerosis is likely to progress with time.     The patient will continue follow up with noninvasive studies as ordered.   3. Primary hypertension Continue antihypertensive medications as already ordered, these medications have been reviewed and there are no changes at this time.  4. Pulmonary emphysema, unspecified emphysema type (HCC) Continue pulmonary medications and aerosols as already ordered, these medications have been reviewed and there are no changes at this time.   5. Type 2 diabetes mellitus with hyperglycemia, without long-term current use of insulin (HCC) Continue hypoglycemic medications as already ordered, these medications have been reviewed and there are no changes at this time.  Hgb A1C to be monitored as already arranged by primary service  6. Hypercholesterolemia Continue statin as ordered and reviewed, no changes at this time    Levora Dredge, MD  05/24/2023 7:42 AM

## 2023-05-25 ENCOUNTER — Ambulatory Visit (INDEPENDENT_AMBULATORY_CARE_PROVIDER_SITE_OTHER): Payer: Medicare PPO

## 2023-05-25 ENCOUNTER — Encounter (INDEPENDENT_AMBULATORY_CARE_PROVIDER_SITE_OTHER): Payer: Self-pay | Admitting: Vascular Surgery

## 2023-05-25 ENCOUNTER — Ambulatory Visit (INDEPENDENT_AMBULATORY_CARE_PROVIDER_SITE_OTHER): Payer: Medicare PPO | Admitting: Vascular Surgery

## 2023-05-25 VITALS — BP 124/70 | HR 79 | Resp 16 | Wt 135.6 lb

## 2023-05-25 DIAGNOSIS — I739 Peripheral vascular disease, unspecified: Secondary | ICD-10-CM | POA: Diagnosis not present

## 2023-05-25 DIAGNOSIS — I714 Abdominal aortic aneurysm, without rupture, unspecified: Secondary | ICD-10-CM | POA: Diagnosis not present

## 2023-05-25 DIAGNOSIS — I10 Essential (primary) hypertension: Secondary | ICD-10-CM

## 2023-05-25 DIAGNOSIS — E7800 Pure hypercholesterolemia, unspecified: Secondary | ICD-10-CM

## 2023-05-25 DIAGNOSIS — E1165 Type 2 diabetes mellitus with hyperglycemia: Secondary | ICD-10-CM

## 2023-05-25 DIAGNOSIS — J439 Emphysema, unspecified: Secondary | ICD-10-CM | POA: Diagnosis not present

## 2023-05-26 ENCOUNTER — Other Ambulatory Visit: Payer: Self-pay | Admitting: Internal Medicine

## 2023-05-27 ENCOUNTER — Other Ambulatory Visit: Payer: Self-pay

## 2023-05-28 ENCOUNTER — Other Ambulatory Visit: Payer: Self-pay

## 2023-05-30 ENCOUNTER — Inpatient Hospital Stay (HOSPITAL_BASED_OUTPATIENT_CLINIC_OR_DEPARTMENT_OTHER): Payer: Medicare PPO | Admitting: Internal Medicine

## 2023-05-30 ENCOUNTER — Inpatient Hospital Stay: Payer: Medicare PPO

## 2023-05-30 ENCOUNTER — Encounter: Payer: Self-pay | Admitting: Internal Medicine

## 2023-05-30 ENCOUNTER — Inpatient Hospital Stay: Payer: Medicare PPO | Attending: Internal Medicine

## 2023-05-30 DIAGNOSIS — D490 Neoplasm of unspecified behavior of digestive system: Secondary | ICD-10-CM | POA: Diagnosis not present

## 2023-05-30 DIAGNOSIS — C154 Malignant neoplasm of middle third of esophagus: Secondary | ICD-10-CM | POA: Insufficient documentation

## 2023-05-30 DIAGNOSIS — Z87891 Personal history of nicotine dependence: Secondary | ICD-10-CM | POA: Insufficient documentation

## 2023-05-30 DIAGNOSIS — C781 Secondary malignant neoplasm of mediastinum: Secondary | ICD-10-CM | POA: Insufficient documentation

## 2023-05-30 DIAGNOSIS — C778 Secondary and unspecified malignant neoplasm of lymph nodes of multiple regions: Secondary | ICD-10-CM | POA: Diagnosis not present

## 2023-05-30 DIAGNOSIS — Z79899 Other long term (current) drug therapy: Secondary | ICD-10-CM | POA: Insufficient documentation

## 2023-05-30 DIAGNOSIS — M8008XA Age-related osteoporosis with current pathological fracture, vertebra(e), initial encounter for fracture: Secondary | ICD-10-CM | POA: Diagnosis not present

## 2023-05-30 DIAGNOSIS — M810 Age-related osteoporosis without current pathological fracture: Secondary | ICD-10-CM

## 2023-05-30 DIAGNOSIS — R7989 Other specified abnormal findings of blood chemistry: Secondary | ICD-10-CM

## 2023-05-30 LAB — CBC WITH DIFFERENTIAL (CANCER CENTER ONLY)
Abs Immature Granulocytes: 0.01 10*3/uL (ref 0.00–0.07)
Basophils Absolute: 0.1 10*3/uL (ref 0.0–0.1)
Basophils Relative: 1 %
Eosinophils Absolute: 0.2 10*3/uL (ref 0.0–0.5)
Eosinophils Relative: 3 %
HCT: 35.7 % — ABNORMAL LOW (ref 36.0–46.0)
Hemoglobin: 12.1 g/dL (ref 12.0–15.0)
Immature Granulocytes: 0 %
Lymphocytes Relative: 31 %
Lymphs Abs: 2.1 10*3/uL (ref 0.7–4.0)
MCH: 30.3 pg (ref 26.0–34.0)
MCHC: 33.9 g/dL (ref 30.0–36.0)
MCV: 89.3 fL (ref 80.0–100.0)
Monocytes Absolute: 0.6 10*3/uL (ref 0.1–1.0)
Monocytes Relative: 9 %
Neutro Abs: 3.8 10*3/uL (ref 1.7–7.7)
Neutrophils Relative %: 56 %
Platelet Count: 230 10*3/uL (ref 150–400)
RBC: 4 MIL/uL (ref 3.87–5.11)
RDW: 12.8 % (ref 11.5–15.5)
WBC Count: 6.8 10*3/uL (ref 4.0–10.5)
nRBC: 0 % (ref 0.0–0.2)

## 2023-05-30 LAB — CMP (CANCER CENTER ONLY)
ALT: 10 U/L (ref 0–44)
AST: 15 U/L (ref 15–41)
Albumin: 4.1 g/dL (ref 3.5–5.0)
Alkaline Phosphatase: 66 U/L (ref 38–126)
Anion gap: 11 (ref 5–15)
BUN: 20 mg/dL (ref 8–23)
CO2: 27 mmol/L (ref 22–32)
Calcium: 9.6 mg/dL (ref 8.9–10.3)
Chloride: 99 mmol/L (ref 98–111)
Creatinine: 1.07 mg/dL — ABNORMAL HIGH (ref 0.44–1.00)
GFR, Estimated: 49 mL/min — ABNORMAL LOW (ref >60)
Glucose, Bld: 121 mg/dL — ABNORMAL HIGH (ref 70–99)
Potassium: 3.9 mmol/L (ref 3.5–5.1)
Sodium: 137 mmol/L (ref 135–145)
Total Bilirubin: 0.5 mg/dL (ref <1.2)
Total Protein: 7.5 g/dL (ref 6.5–8.1)

## 2023-05-30 LAB — VITAMIN D 25 HYDROXY (VIT D DEFICIENCY, FRACTURES): Vit D, 25-Hydroxy: 51.14 ng/mL (ref 30–100)

## 2023-05-30 LAB — T4, FREE: Free T4: 0.95 ng/dL (ref 0.61–1.12)

## 2023-05-30 LAB — TSH: TSH: 0.591 u[IU]/mL (ref 0.350–4.500)

## 2023-05-30 NOTE — Progress Notes (Signed)
Patient states that she is doing well. She had a PET scan on 05/17/2023. She has no new questions or concerns for the doctor today.

## 2023-05-30 NOTE — Assessment & Plan Note (Addendum)
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  Currently on surveillance. DEC 2024-12th, 2024- PET scan: No findings of active malignancy;  Stable chronic low-grade activity along the right para-mediastinal radiation fibrosis; Stable mildly accentuated activity in the distal esophagus, likely physiologic given that the patient's original esophageal malignancy was in the mid esophagus just above the level of the carina. No abnormal activity in the mid esophagus at the site of the patient's original tumor.  # Asymptomatic L2 lesion [concerning for recurrence versus compression fracture/osteoporosis]- s/p Bx- NEGATIVE for any malignancy.  PET DEC 2024- stable.  Monitor for now.  # Plan is to continue to hold Opdivo at this time given patient's patient's colitis on immunotherapy/and also given imaging-NED-continue surveillance.  # OCT 2024- BMD T score: -3.1-/L2 compression fracture. Patient currently on vitamin D 2000 units+ calcium 1000 mg.  Patient has dentures- recommended dental clearance regarding Reclast.  Patient declines she continues to be on vitamin D plus calcium at this time.  # Autoimmune colitis/Opdivo induced diarrhea-s/p evaluation with GI; Dr.Wohl- improved- currently  OFF prednisone -monitor for now- stable.  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  # Mild  Anemia- improved- Hb 12.3 stable; continue gentle iron- space to every other day- .Stable.  # PBF-BG- 121  Monitor for now [Gluerna protein shakes]- continue OFF prednisone as above.Stable.  #Hx of goitre-/ TSH low-however normal free T3-T4- .currently on surveillance s/p evaluation with  Dr. Evlyn Kanner, [facility] endocrinology.  Labs pending today.  Endo -stable-  # Vaccinations: ok with Flu shot; and Covid-19.; ok  with RSV  #  infrarenal abdominal aortic aneurysm- table fusiform infrarenal abdominal aortic aneurysm 4.9 cm anterior-posterior. Recommend follow-up ultrasound every 6 months and vascular consultation.[ Dr.scheiner]- stable.   # IV access: PIV.   # handicap: permit given.   # DISPOSITION: # HOLD Reclast-  # follow up in 4  months- MD; labs- cbc/cmp; vit D 25-OH levels;  PET scan- Dr.B  # I reviewed the blood work- with the patient in detail; also reviewed the imaging independently [as summarized above]; and with the patient in detail.

## 2023-05-30 NOTE — Progress Notes (Signed)
[facility] OFFICE PROGRESS NOTE  Patient Care Team: Dale Crimora, MD as PCP - General (Internal Medicine) Benita Gutter, RN as Oncology Nurse Navigator Earna Coder, MD as Consulting Physician (Oncology) Midge Minium, MD as Consulting Physician (Gastroenterology) Borders, Daryl Eastern, NP as Nurse Practitioner (Hospice and Palliative Medicine) Keitha Butte, RN as Registered Nurse (Oncology)   Cancer Staging  Neoplasm of middle third of esophagus Staging form: Esophagus - Squamous Cell Carcinoma, AJCC 8th Edition - Clinical: Stage Unknown (cTX, cN1, cM0) - Signed by Earna Coder, MD on 05/10/2021 - Pathologic: No stage assigned - Unsigned   Oncology History Overview Note  # 2015-patient is an 89 year old female with 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  Starting radiation and chemotherapy from April 14, 2014 Patient was started on carboplatinum andTaxol Herve were developed an allergic reaction to Taxol so would be changed to Abraxane 3.patient has finished 6 cycles of weekly chemotherapy with carboplatinum  and radiation therapy(May 28, 2014) 4.started on  NIVOLULAMAB because of persistent disease July 02, 2014. 5.  NIVOLULAMAB was discontinued because of persistent diarrhea in July of 2016.  August of 2016 CT scan was stable so no further chemotherapy  # AUG 25th PET- STABLE RUL MASS [radiation fibrosis; <1cm ? 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   APRIL 16th, 2023- PET scan-  Resolution of metabolic activity in the mid esophagus;  Persistent hypermetabolic  activity within a subcarinal lymph node. Differential includes residual carcinoma versus reactive adenopathy.  No evidence of distant metastatic disease.  # MAY 18th, 2023- s/p EUS [Dr.Spaete]-endoscopic biopsy of the mediastinal lymph node. MAY 18th, 2023- Status post endoscopic ultrasound/biopsy of the mediastinal/subcarinal lymph node- BIOPSY POSITIVE FOR RECURRENT SQUAMOUS CELL.  # JUNE 9th, 2023- Opdivo every 2 weeks.status post Opdivoq 2 W x4 cycles- [held because of poor tolerance/diarrhea last July 23rd, 2023].  Currently on surveillance.  # SEP 3rd, 2024- PET scan-  Mild focal hypermetabolism associated with the distal esophagus, without a definitive CT correlate. Difficult to exclude disease recurrence; Increasing hypermetabolism associated with an L2 inferior endplate compression fracture. Additionally, lucency associated with the inferior endplate appears larger-see below.  Discussed the option of endoscopy now versus follow-up PET scan in about a month.  Patient interested in repeating the scan in a month and then decide if she needs to have an endoscopy.  I have informed Dr.Wohl.   # OCT 2024-Asymptomatic L2 lesion [concerning for recurrence versus compression fracture/osteoporosis]- s/p Bx- NEGATIVE for any malignancy.     # AAA/ 3.3x 3.9 Stable [Dr.Schnier] -----------------------------------------------------       History of lung cancer  Malignant neoplasm of right upper lobe of lung (HCC)  08/07/2019 Initial Diagnosis   Malignant neoplasm of right upper lobe of lung (HCC)    INTERVAL HISTORY: Ambulating with a rolling walker.  Accompanied by son; other son over the phone.  Heather Cooper 89 y.o.  female pleasant patient  diagnosed squamous cell carcinoma of the midesophagus-with recurrent disease in the mediastinum  s/p Opdivo [currently on HOLD given the concern for autoimmune colitis from immunotherapy]-on surveillance is review results of  the PET scan.  Patient states  that she is doing well. She had a PET scan on 05/17/2023. She has no new questions or concerns.   Denies any difficulty swallowing.  Denies any nausea vomiting abdominal pain.  Denies any diarrhea. She is more  up and about.  Patient is  s/p physical therapy. No difficulty swallowing.   Review of Systems  Constitutional:  Positive for malaise/fatigue. Negative for chills, diaphoresis, fever and weight loss.  HENT:  Negative for nosebleeds and sore throat.   Eyes:  Negative for double vision.  Respiratory:  Negative for hemoptysis, sputum production, shortness of breath and wheezing.   Cardiovascular:  Negative for chest pain, palpitations, orthopnea and leg swelling.  Gastrointestinal:  Negative for abdominal pain, blood in stool, constipation, diarrhea, heartburn, melena, nausea and vomiting.  Genitourinary:  Negative for dysuria, frequency and urgency.  Musculoskeletal:  Negative for back pain and joint pain.  Skin: Negative.  Negative for itching and rash.  Neurological:  Negative for dizziness, tingling, focal weakness, weakness and headaches.  Endo/Heme/Allergies:  Does not bruise/bleed easily.  Psychiatric/Behavioral:  Negative for depression. The patient is not nervous/anxious and does not have insomnia.    PAST MEDICAL HISTORY :  Past Medical History:  Diagnosis Date   Chemotherapy induced nausea and vomiting    Chicken pox    Esophageal cancer (HCC)    Hypercalcemia    familial hypocalciuric hypercalcemia   Hypercholesterolemia    Hypertension    Lung cancer (HCC)    Osteoporosis    Thyroid disease     PAST SURGICAL HISTORY :   Past Surgical History:  Procedure Laterality Date   ABDOMINAL HYSTERECTOMY     partial   CHOLECYSTECTOMY     COLONOSCOPY WITH PROPOFOL N/A 04/17/2017   Procedure: COLONOSCOPY WITH PROPOFOL;  Surgeon: Scot Jun, MD;  Location: [facility] ENDOSCOPY;  Service: Endoscopy;  Laterality: N/A;   ESOPHAGOGASTRODUODENOSCOPY (EGD) WITH PROPOFOL N/A  04/13/2021   Procedure: ESOPHAGOGASTRODUODENOSCOPY (EGD) WITH PROPOFOL;  Surgeon: Midge Minium, MD;  Location: [facility] ENDOSCOPY;  Service: Endoscopy;  Laterality: N/A;   EUS N/A 04/22/2021   Procedure: FULL UPPER ENDOSCOPIC ULTRASOUND (EUS) RADIAL;  Surgeon: Rayann Heman, MD;  Location: [facility] ENDOSCOPY;  Service: Endoscopy;  Laterality: N/A;  [facility] needed   EUS N/A 10/28/2021   Procedure: UPPER ENDOSCOPIC ULTRASOUND (EUS) LINEAR;  Surgeon: Doren Custard, MD;  Location: [facility] ENDOSCOPY;  Service: Gastroenterology;  Laterality: N/A;  [facility]   IR RADIOLOGIST EVAL & MGMT  03/30/2023   transvaginal hysterectomy  04/18/05   with anterior colporrhaphy    FAMILY HISTORY :   Family History  Problem Relation Age of Onset   Stroke Mother    Hypertension Mother    Prostate cancer Father    Cancer Father        prostate   Heart disease Brother        s/p CABG   Colon cancer Neg Hx     SOCIAL HISTORY:   Social History   Tobacco Use   Smoking status: Former    Current packs/day: 0.00    Types: Cigarettes    Quit date: 06/13/1997    Years since quitting: 25.9   Smokeless tobacco: Never  Vaping Use   Vaping status: Never Used  Substance Use Topics   Alcohol use: No    Alcohol/week: 0.0 standard drinks of alcohol   Drug use: No    ALLERGIES:  is allergic to paclitaxel.  MEDICATIONS:  Current Outpatient  Medications  Medication Sig Dispense Refill   cholecalciferol (VITAMIN D3) 25 MCG (1000 UNIT) tablet Take 1,000 Units by mouth daily.     ferrous sulfate 325 (65 FE) MG EC tablet Take 325 mg by mouth daily with breakfast.     fluticasone (FLONASE) 50 MCG/ACT nasal spray Place 2 sprays into both nostrils daily. 16 g 3   levocetirizine (XYZAL) 5 MG tablet Take 1 tablet (5 mg total) by mouth every evening. 90 tablet 2   nystatin cream (MYCOSTATIN) Apply 1 Application topically 2 (two) times daily. 30 g 0   olmesartan-hydrochlorothiazide (BENICAR HCT) 20-12.5 MG tablet TAKE 1 TABLET BY  MOUTH ONCE DAILY 90 tablet 1   pantoprazole (PROTONIX) 40 MG tablet Take 1 tablet (40 mg total) by mouth daily. 90 tablet 3   No current facility-administered medications for this visit.    PHYSICAL EXAMINATION: ECOG PERFORMANCE STATUS: 0 - Asymptomatic  BP 118/68 (BP Location: Right Arm, Patient Position: Sitting, Cuff Size: Normal)   Pulse 81   Temp (!) 97.2 F (36.2 C) (Tympanic)   Resp 16   Wt 137 lb (62.1 kg)   SpO2 99%   BMI 24.27 kg/m   Filed Weights   05/30/23 0930  Weight: 137 lb (62.1 kg)         Physical Exam HENT:     Head: Normocephalic and atraumatic.     Mouth/Throat:     Pharynx: No oropharyngeal exudate.  Eyes:     Pupils: Pupils are equal, round, and reactive to light.  Cardiovascular:     Rate and Rhythm: Normal rate and regular rhythm.  Pulmonary:     Effort: Pulmonary effort is normal. No respiratory distress.     Breath sounds: Normal breath sounds. No wheezing.  Abdominal:     General: Bowel sounds are normal. There is no distension.     Palpations: Abdomen is soft. There is no mass.     Tenderness: There is no abdominal tenderness. There is no guarding or rebound.  Musculoskeletal:        General: No tenderness. Normal range of motion.     Cervical back: Normal range of motion and neck supple.  Skin:    General: Skin is warm.  Neurological:     Mental Status: She is alert and oriented to person, place, and time.  Psychiatric:        Mood and Affect: Affect normal.      LABORATORY DATA:  I have reviewed the data as listed    Component Value Date/Time   NA 137 05/30/2023 0917   NA 130 (L) 10/09/2014 0846   K 3.9 05/30/2023 0917   K 4.1 10/09/2014 0846   CL 99 05/30/2023 0917   CL 98 (L) 10/09/2014 0846   CO2 27 05/30/2023 0917   CO2 25 10/09/2014 0846   GLUCOSE 121 (H) 05/30/2023 0917   GLUCOSE 161 (H) 10/09/2014 0846   BUN 20 05/30/2023 0917   BUN 14 10/09/2014 0846   CREATININE 1.07 (H) 05/30/2023 0917   CREATININE  0.70 10/09/2014 0846   CALCIUM 9.6 05/30/2023 0917   CALCIUM 9.1 10/09/2014 0846   PROT 7.5 05/30/2023 0917   PROT 7.3 10/09/2014 0846   ALBUMIN 4.1 05/30/2023 0917   ALBUMIN 3.6 10/09/2014 0846   AST 15 05/30/2023 0917   ALT 10 05/30/2023 0917   ALT 13 (L) 10/09/2014 0846   ALKPHOS 66 05/30/2023 0917   ALKPHOS 70 10/09/2014 0846   BILITOT 0.5 05/30/2023 1610  GFRNONAA 49 (L) 05/30/2023 0917   GFRNONAA >60 10/09/2014 0846   GFRAA >60 02/05/2020 1004   GFRAA >60 10/09/2014 0846    No results found for: "SPEP", "UPEP"  Lab Results  Component Value Date   WBC 6.8 05/30/2023   NEUTROABS 3.8 05/30/2023   HGB 12.1 05/30/2023   HCT 35.7 (L) 05/30/2023   MCV 89.3 05/30/2023   PLT 230 05/30/2023      Chemistry      Component Value Date/Time   NA 137 05/30/2023 0917   NA 130 (L) 10/09/2014 0846   K 3.9 05/30/2023 0917   K 4.1 10/09/2014 0846   CL 99 05/30/2023 0917   CL 98 (L) 10/09/2014 0846   CO2 27 05/30/2023 0917   CO2 25 10/09/2014 0846   BUN 20 05/30/2023 0917   BUN 14 10/09/2014 0846   CREATININE 1.07 (H) 05/30/2023 0917   CREATININE 0.70 10/09/2014 0846   GLU 111 03/18/2014 1048      Component Value Date/Time   CALCIUM 9.6 05/30/2023 0917   CALCIUM 9.1 10/09/2014 0846   ALKPHOS 66 05/30/2023 0917   ALKPHOS 70 10/09/2014 0846   AST 15 05/30/2023 0917   ALT 10 05/30/2023 0917   ALT 13 (L) 10/09/2014 0846   BILITOT 0.5 05/30/2023 0917       RADIOGRAPHIC STUDIES: I have personally reviewed the radiological images as listed and agreed with the findings in the report. No results found.    ASSESSMENT & PLAN:  Neoplasm of middle third of esophagus # Squamous cell carcinoma of the mid-esophagus [NOV 2022]-  locally advanced; RECURRENT [s/p EUS- LB Bx-] MAY 18th, 2023-most recently status post Opdivoq 2 W x4 cycles- [held because of poor tolerance/diarrhea last July 23rd, 2023].  Currently on surveillance. DEC 2024-12th, 2024- PET scan: No findings of active  malignancy;  Stable chronic low-grade activity along the right para-mediastinal radiation fibrosis; Stable mildly accentuated activity in the distal esophagus, likely physiologic given that the patient's original esophageal malignancy was in the mid esophagus just above the level of the carina. No abnormal activity in the mid esophagus at the site of the patient's original tumor.  # Asymptomatic L2 lesion [concerning for recurrence versus compression fracture/osteoporosis]- s/p Bx- NEGATIVE for any malignancy.  PET DEC 2024- stable.  Monitor for now.  # Plan is to continue to hold Opdivo at this time given patient's patient's colitis on immunotherapy/and also given imaging-NED-continue surveillance.  # OCT 2024- BMD T score: -3.1-/L2 compression fracture. Patient currently on vitamin D 2000 units+ calcium 1000 mg.  Patient has dentures- recommended dental clearance regarding Reclast.  Patient declines she continues to be on vitamin D plus calcium at this time.  # Autoimmune colitis/Opdivo induced diarrhea-s/p evaluation with GI; Dr.Wohl- improved- currently  OFF prednisone -monitor for now- stable.  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  # Mild  Anemia- improved- Hb 12.3 stable; continue gentle iron- space to every other day- .Stable.  # PBF-BG- 121  Monitor for now [Gluerna protein shakes]- continue OFF prednisone as above.Stable.  #Hx of goitre-/ TSH low-however normal free T3-T4- .currently on surveillance s/p evaluation with  Dr. Evlyn Kanner, [facility] endocrinology.  Labs pending today.  Endo -stable-  # Vaccinations: ok with Flu shot; and Covid-19.; ok with RSV  #  infrarenal abdominal aortic aneurysm- table fusiform infrarenal abdominal aortic aneurysm 4.9 cm anterior-posterior. Recommend follow-up ultrasound every 6 months and vascular consultation.[ Dr.scheiner]- stable.   # IV access: PIV.   #  handicap: permit given.   # DISPOSITION: # HOLD Reclast-   # follow up in 4  months- MD; labs- cbc/cmp; vit D 25-OH levels;  PET scan- Dr.B  # I reviewed the blood work- with the patient in detail; also reviewed the imaging independently [as summarized above]; and with the patient in detail.     Orders Placed This Encounter  Procedures   NM PET Image Restage (PS) Skull Base to Thigh (F-18 FDG)    Standing Status:   Future    Expected Date:   09/28/2023    Expiration Date:   05/29/2024    If indicated for the ordered procedure, I authorize the administration of a radiopharmaceutical per Radiology protocol:   Yes    Preferred imaging location?:   [facility]   CBC with Differential (Cancer Center Only)    Standing Status:   Future    Expected Date:   09/26/2023    Expiration Date:   05/29/2024   CMP (Cancer Center only)    Standing Status:   Future    Expected Date:   09/26/2023    Expiration Date:   05/29/2024   VITAMIN D 25 Hydroxy (Vit-D Deficiency, Fractures)    Standing Status:   Future    Expected Date:   09/26/2023    Expiration Date:   05/29/2024    All questions were answered. The patient knows to call the clinic with any problems, questions or concerns.      Earna Coder, MD 05/30/2023 11:16 AM

## 2023-05-31 LAB — T3, FREE: T3, Free: 4 pg/mL (ref 2.0–4.4)

## 2023-06-05 ENCOUNTER — Ambulatory Visit: Payer: Medicare PPO | Admitting: Radiation Oncology

## 2023-06-27 DIAGNOSIS — H4301 Vitreous prolapse, right eye: Secondary | ICD-10-CM | POA: Diagnosis not present

## 2023-06-27 DIAGNOSIS — H40023 Open angle with borderline findings, high risk, bilateral: Secondary | ICD-10-CM | POA: Diagnosis not present

## 2023-06-27 DIAGNOSIS — H353131 Nonexudative age-related macular degeneration, bilateral, early dry stage: Secondary | ICD-10-CM | POA: Diagnosis not present

## 2023-06-27 DIAGNOSIS — Z961 Presence of intraocular lens: Secondary | ICD-10-CM | POA: Diagnosis not present

## 2023-09-11 ENCOUNTER — Encounter: Payer: Self-pay | Admitting: Internal Medicine

## 2023-09-12 ENCOUNTER — Encounter

## 2023-09-12 ENCOUNTER — Ambulatory Visit (INDEPENDENT_AMBULATORY_CARE_PROVIDER_SITE_OTHER): Admitting: *Deleted

## 2023-09-12 VITALS — Ht 63.0 in | Wt 140.0 lb

## 2023-09-12 DIAGNOSIS — Z0000 Encounter for general adult medical examination without abnormal findings: Secondary | ICD-10-CM

## 2023-09-12 NOTE — Patient Instructions (Signed)
 Heather Cooper , Thank you for taking time to come for your Medicare Wellness Visit. I appreciate your ongoing commitment to your health goals. Please review the following plan we discussed and let me know if I can assist you in the future.   Referrals/Orders/Follow-Ups/Clinician Recommendations: Remember to update your tetanus (Tdap) vaccine at the pharmacy.  This is a list of the screening recommended for you and due dates:  Health Maintenance  Topic Date Due   Complete foot exam   Never done   DTaP/Tdap/Td vaccine (1 - Tdap) Never done   Hemoglobin A1C  02/26/2022   COVID-19 Vaccine (6 - 2024-25 season) 06/06/2023   Eye exam for diabetics  06/10/2023   Flu Shot  01/12/2024   Medicare Annual Wellness Visit  09/11/2024   Pneumonia Vaccine  Completed   DEXA scan (bone density measurement)  Completed   Zoster (Shingles) Vaccine  Completed   HPV Vaccine  Aged Out   Mammogram  Discontinued    Advanced directives: (Copy Requested) Please bring a copy of your health care power of attorney and living will to the office to be added to your chart at your convenience. You can mail to [facility] [address] [place], [place] [address] or email to [email or web address]  Next Medicare Annual Wellness Visit scheduled for next year: Yes 09/17/24 @ 1:00

## 2023-09-12 NOTE — Progress Notes (Signed)
 Subjective:   Heather Cooper is a 89 y.o. who presents for a Medicare Wellness preventive visit.  Visit Complete: Virtual I connected with  Cliffton Asters on 09/12/23 by a audio enabled telemedicine application and verified that I am speaking with the correct person using two identifiers.  Patient Location: Home  Provider Location: Office/Clinic  I discussed the limitations of evaluation and management by telemedicine. The patient expressed understanding and agreed to proceed.  Vital Signs: Because this visit was a virtual/telehealth visit, some criteria may be missing or patient reported. Any vitals not documented were not able to be obtained and vitals that have been documented are patient reported.  VideoDeclined- This patient declined Librarian, academic. Therefore the visit was completed with audio only.  Persons Participating in Visit: Patient.  AWV Questionnaire: No: Patient Medicare AWV questionnaire was not completed prior to this visit.  Cardiac Risk Factors include: advanced age (>72men, >80 women);diabetes mellitus;dyslipidemia;hypertension     Objective:    Today's Vitals   09/12/23 1421  Weight: 140 lb (63.5 kg)  Height: 5\' 3"  (1.6 m)   Body mass index is 24.8 kg/m.     09/12/2023    2:35 PM 05/30/2023    9:16 AM 05/02/2023    9:16 AM 03/21/2023    8:48 AM 12/20/2022    1:12 PM 12/19/2022    9:55 AM 09/13/2022   10:06 AM  Advanced Directives  Does Patient Have a Medical Advance Directive? Yes Yes Yes Yes Yes Yes Yes  Type of Estate agent of [place];Living will Healthcare Power of [place];Living will Healthcare Power of [place];Living will Healthcare Power of [place];Living will  Healthcare Power of [place];Living will Healthcare Power of [place];Living will  Does patient want to make changes to medical advance directive?    No - Patient declined     Copy of Healthcare Power of Attorney in Chart? No - copy  requested  No - copy requested No - copy requested  No - copy requested No - copy requested  Would patient like information on creating a medical advance directive?      No - Patient declined     Current Medications (verified) Outpatient Encounter Medications as of 09/12/2023  Medication Sig   cholecalciferol (VITAMIN D3) 25 MCG (1000 UNIT) tablet Take 1,000 Units by mouth daily.   ferrous sulfate 325 (65 FE) MG EC tablet Take 325 mg by mouth daily with breakfast.   fluticasone (FLONASE) 50 MCG/ACT nasal spray Place 2 sprays into both nostrils daily.   levocetirizine (XYZAL) 5 MG tablet Take 1 tablet (5 mg total) by mouth every evening.   nystatin cream (MYCOSTATIN) Apply 1 Application topically 2 (two) times daily.   olmesartan-hydrochlorothiazide (BENICAR HCT) 20-12.5 MG tablet TAKE 1 TABLET BY MOUTH ONCE DAILY   pantoprazole (PROTONIX) 40 MG tablet Take 1 tablet (40 mg total) by mouth daily.   [DISCONTINUED] prochlorperazine (COMPAZINE) 10 MG tablet Take 1 tablet (10 mg total) by mouth every 6 (six) hours as needed (Nausea or vomiting). (Patient not taking: Reported on 09/29/2021)   No facility-administered encounter medications on file as of 09/12/2023.    Allergies (verified) Paclitaxel   History: Past Medical History:  Diagnosis Date   Chemotherapy induced nausea and vomiting    Chicken pox    Esophageal cancer (HCC)    Hypercalcemia    familial hypocalciuric hypercalcemia   Hypercholesterolemia    Hypertension    Lung cancer (HCC)    Osteoporosis  Thyroid disease    Past Surgical History:  Procedure Laterality Date   ABDOMINAL HYSTERECTOMY     partial   CHOLECYSTECTOMY     COLONOSCOPY WITH PROPOFOL N/A 04/17/2017   Procedure: COLONOSCOPY WITH PROPOFOL;  Surgeon: Scot Jun, MD;  Location: [facility] ENDOSCOPY;  Service: Endoscopy;  Laterality: N/A;   ESOPHAGOGASTRODUODENOSCOPY (EGD) WITH PROPOFOL N/A 04/13/2021   Procedure: ESOPHAGOGASTRODUODENOSCOPY (EGD) WITH  PROPOFOL;  Surgeon: Midge Minium, MD;  Location: [facility] ENDOSCOPY;  Service: Endoscopy;  Laterality: N/A;   EUS N/A 04/22/2021   Procedure: FULL UPPER ENDOSCOPIC ULTRASOUND (EUS) RADIAL;  Surgeon: Rayann Heman, MD;  Location: [facility] ENDOSCOPY;  Service: Endoscopy;  Laterality: N/A;  [facility] needed   EUS N/A 10/28/2021   Procedure: UPPER ENDOSCOPIC ULTRASOUND (EUS) LINEAR;  Surgeon: Doren Custard, MD;  Location: [facility] ENDOSCOPY;  Service: Gastroenterology;  Laterality: N/A;  [facility]   IR RADIOLOGIST EVAL & MGMT  03/30/2023   transvaginal hysterectomy  04/18/05   with anterior colporrhaphy   Family History  Problem Relation Age of Onset   Stroke Mother    Hypertension Mother    Prostate cancer Father    Cancer Father        prostate   Heart disease Brother        s/p CABG   Colon cancer Neg Hx    Social History   Socioeconomic History   Marital status: Widowed    Spouse name: Not on file   Number of children: 3   Years of education: Not on file   Highest education level: Not on file  Occupational History   Not on file  Tobacco Use   Smoking status: Former    Current packs/day: 0.00    Types: Cigarettes    Quit date: 06/13/1997    Years since quitting: 26.2   Smokeless tobacco: Never  Vaping Use   Vaping status: Never Used  Substance and Sexual Activity   Alcohol use: No    Alcohol/week: 0.0 standard drinks of alcohol   Drug use: No   Sexual activity: Not Currently  Other Topics Concern   Not on file  Social History Narrative   Lost her husband November 2019   Social Drivers of Corporate investment banker Strain: Low Risk  (09/12/2023)   Overall Financial Resource Strain (CARDIA)    Difficulty of Paying Living Expenses: Not hard at all  Food Insecurity: No Food Insecurity (09/12/2023)   Hunger Vital Sign    Worried About Running Out of Food in the Last Year: Never true    Ran Out of Food in the Last Year: Never true  Transportation Needs: No Transportation Needs  (09/12/2023)   PRAPARE - Administrator, Civil Service (Medical): No    Lack of Transportation (Non-Medical): No  Physical Activity: Inactive (09/12/2023)   Exercise Vital Sign    Days of Exercise per Week: 0 days    Minutes of Exercise per Session: 0 min  Stress: No Stress Concern Present (09/12/2023)   [employer] of Occupational Health - Occupational Stress Questionnaire    Feeling of Stress : Not at all  Social Connections: Moderately Isolated (09/12/2023)   Social Connection and Isolation Panel [NHANES]    Frequency of Communication with Friends and Family: More than three times a week    Frequency of Social Gatherings with Friends and Family: Three times a week    Attends Religious Services: More than 4 times per year    Active Member  of Clubs or Organizations: No    Attends Banker Meetings: Never    Marital Status: Widowed    Tobacco Counseling Counseling given: Not Answered    Clinical Intake:  Pre-visit preparation completed: Yes  Pain : No/denies pain     BMI - recorded: 24.8 Nutritional Status: BMI of 19-24  Normal Nutritional Risks: None Diabetes: Yes CBG done?: No Did pt. bring in CBG monitor from home?: No  Lab Results  Component Value Date   HGBA1C 5.5 08/26/2021   HGBA1C 6.6 (H) 01/19/2021   HGBA1C 6.6 (H) 09/09/2020     How often do you need to have someone help you when you read instructions, pamphlets, or other written materials from your doctor or pharmacy?: 1 - Never  Interpreter Needed?: No  Information entered by :: R. Chawn Spraggins LPN   Activities of Daily Living     09/12/2023    2:24 PM  In your present state of health, do you have any difficulty performing the following activities:  Hearing? 1  Comment wears aids  Vision? 0  Comment readers  Difficulty concentrating or making decisions? 0  Walking or climbing stairs? 1  Dressing or bathing? 1  Doing errands, shopping? 1  Preparing Food and eating ? N  Using  the Toilet? N  In the past six months, have you accidently leaked urine? N  Do you have problems with loss of bowel control? N  Managing your Medications? N  Managing your Finances? Y  Comment son helps  Housekeeping or managing your Housekeeping? Y    Patient Care Team: Dale Costilla, MD as PCP - General (Internal Medicine) Benita Gutter, RN as Oncology Nurse Navigator Earna Coder, MD as Consulting Physician (Oncology) Midge Minium, MD as Consulting Physician (Gastroenterology) Borders, Daryl Eastern, NP as Nurse Practitioner (Hospice and Palliative Medicine) Keitha Butte, RN as Registered Nurse (Oncology)  Indicate any recent Medical Services you may have received from other than [facility] providers in the past year (date may be approximate).     Assessment:   This is a routine wellness examination for [place].  Hearing/Vision screen Hearing Screening - Comments:: Wears aids  Vision Screening - Comments:: readers   Goals Addressed             This Visit's Progress    Patient Stated       Wants to exercise more and lose some weight try to walk more with cane instead of walker       Depression Screen     09/12/2023    2:30 PM 05/16/2023   10:37 AM 04/12/2022    3:40 PM 01/25/2021   10:37 AM 01/19/2021   10:36 AM 01/08/2020   10:36 AM 11/06/2019   10:12 AM  PHQ 2/9 Scores  PHQ - 2 Score 0 0 0 0 0 0 0  PHQ- 9 Score 0          Fall Risk     09/12/2023    2:27 PM 05/16/2023   10:37 AM 04/12/2022    3:39 PM 01/25/2021   10:40 AM 01/19/2021   10:36 AM  Fall Risk   Falls in the past year? 0 0 0 0 0  Number falls in past yr: 0 0 0 0 0  Injury with Fall? 0 0 0 0 0  Risk for fall due to : No Fall Risks No Fall Risks History of fall(s);Impaired balance/gait;Impaired mobility  Impaired mobility  Follow up Falls prevention discussed;Falls evaluation  completed Falls evaluation completed Falls evaluation completed Falls evaluation completed Falls evaluation completed     MEDICARE RISK AT HOME:  Medicare Risk at Home Any stairs in or around the home?: Yes If so, are there any without handrails?: No Home free of loose throw rugs in walkways, pet beds, electrical cords, etc?: Yes Adequate lighting in your home to reduce risk of falls?: Yes Life alert?: No Use of a cane, walker or w/c?: Yes Grab bars in the bathroom?: Yes Shower chair or bench in shower?: Yes Elevated toilet seat or a handicapped toilet?: Yes  TIMED UP AND GO:  Was the test performed?  No  Cognitive Function: 6CIT completed        09/12/2023    2:35 PM 01/25/2021   10:45 AM 01/08/2020   10:43 AM 01/07/2019   10:57 AM 01/03/2018   11:53 AM  6CIT Screen  What Year? 0 points 0 points 0 points 0 points 0 points  What month? 0 points 0 points 0 points 0 points 0 points  What time? 0 points 0 points  0 points 0 points  Count back from 20 2 points   0 points 0 points  Months in reverse 0 points 0 points 0 points 0 points 0 points  Repeat phrase 0 points  0 points  0 points  Total Score 2 points    0 points    Immunizations Immunization History  Administered Date(s) Administered   Fluad Quad(high Dose 65+) 03/06/2020   Influenza Split 03/24/2014   Influenza, High Dose Seasonal PF 03/21/2016, 02/22/2017, 02/20/2018, 03/01/2019   Influenza,inj,Quad PF,6+ Mos 03/02/2015   Influenza-Unspecified 03/14/2012, 02/17/2018, 03/13/2021, 03/16/2022, 03/09/2023   PFIZER Comirnaty(Gray Top)Covid-19 Tri-Sucrose Vaccine 04/11/2023   PFIZER(Purple Top)SARS-COV-2 Vaccination 07/04/2019, 07/25/2019, 03/26/2020   Pfizer Covid-19 Vaccine Bivalent Booster 73yrs & up 03/16/2022   Pneumococcal Conjugate-13 05/06/2019   Pneumococcal Polysaccharide-23 06/26/2012   Zoster Recombinant(Shingrix) 08/21/2020, 10/22/2020    Screening Tests Health Maintenance  Topic Date Due   FOOT EXAM  Never done   DTaP/Tdap/Td (1 - Tdap) Never done   Medicare Annual Wellness (AWV)  01/25/2022   HEMOGLOBIN A1C   02/26/2022   COVID-19 Vaccine (6 - 2024-25 season) 06/06/2023   OPHTHALMOLOGY EXAM  06/10/2023   INFLUENZA VACCINE  01/12/2024   Pneumonia Vaccine 69+ Years old  Completed   DEXA SCAN  Completed   Zoster Vaccines- Shingrix  Completed   HPV VACCINES  Aged Out   MAMMOGRAM  Discontinued    Health Maintenance  Health Maintenance Due  Topic Date Due   FOOT EXAM  Never done   DTaP/Tdap/Td (1 - Tdap) Never done   Medicare Annual Wellness (AWV)  01/25/2022   HEMOGLOBIN A1C  02/26/2022   COVID-19 Vaccine (6 - 2024-25 season) 06/06/2023   OPHTHALMOLOGY EXAM  06/10/2023   Health Maintenance Items Addressed: Discussed the need to update tetanus vaccine. Patient is scheduled for appointment with PCP  11/14/23 and will need foot exam documented.  Patient declines covid vaccine.   Additional Screening:  Vision Screening: Recommended annual ophthalmology exams for early detection of glaucoma and other disorders of the eye. Up to date [facility] Will request last office visit notes  Dental Screening: Recommended annual dental exams for proper oral hygiene  Community Resource Referral / Chronic Care Management: CRR required this visit?  No   CCM required this visit?  No     Plan:     I have personally reviewed and noted the following in the patient's  chart:   Medical and social history Use of alcohol, tobacco or illicit drugs  Current medications and supplements including opioid prescriptions. Patient is not currently taking opioid prescriptions. Functional ability and status Nutritional status Physical activity Advanced directives List of other physicians Hospitalizations, surgeries, and ER visits in previous 12 months Vitals Screenings to include cognitive, depression, and falls Referrals and appointments  In addition, I have reviewed and discussed with patient certain preventive protocols, quality metrics, and best practice recommendations. A written personalized care plan  for preventive services as well as general preventive health recommendations were provided to patient.     Sydell Axon, LPN   02/11/4781   After Visit Summary: (MyChart) Due to this being a telephonic visit, the after visit summary with patients personalized plan was offered to patient via MyChart   Notes: Please refer to Routing Comments.

## 2023-09-13 ENCOUNTER — Other Ambulatory Visit: Payer: Self-pay

## 2023-09-19 ENCOUNTER — Ambulatory Visit
Admission: RE | Admit: 2023-09-19 | Discharge: 2023-09-19 | Disposition: A | Discharge status: Home / Self Care | Payer: TRICARE For Life (TFL) | Source: Ambulatory Visit | Attending: Internal Medicine | Admitting: Internal Medicine

## 2023-09-19 DIAGNOSIS — I714 Abdominal aortic aneurysm, without rupture, unspecified: Secondary | ICD-10-CM | POA: Insufficient documentation

## 2023-09-19 DIAGNOSIS — Z923 Personal history of irradiation: Secondary | ICD-10-CM | POA: Diagnosis not present

## 2023-09-19 DIAGNOSIS — Z9221 Personal history of antineoplastic chemotherapy: Secondary | ICD-10-CM | POA: Diagnosis not present

## 2023-09-19 DIAGNOSIS — Z8501 Personal history of malignant neoplasm of esophagus: Secondary | ICD-10-CM | POA: Diagnosis not present

## 2023-09-19 DIAGNOSIS — D490 Neoplasm of unspecified behavior of digestive system: Secondary | ICD-10-CM | POA: Diagnosis not present

## 2023-09-19 LAB — GLUCOSE, CAPILLARY: Glucose-Capillary: 110 mg/dL — ABNORMAL HIGH (ref 70–99)

## 2023-09-19 MED ORDER — FLUDEOXYGLUCOSE F - 18 (FDG) INJECTION
7.3000 | Freq: Once | INTRAVENOUS | Status: AC | PRN
  Administered 2023-09-19: 7.61 via INTRAVENOUS

## 2023-09-26 ENCOUNTER — Inpatient Hospital Stay: Payer: TRICARE For Life (TFL) | Attending: Internal Medicine

## 2023-09-26 ENCOUNTER — Encounter: Payer: Self-pay | Admitting: Internal Medicine

## 2023-09-26 ENCOUNTER — Other Ambulatory Visit: Payer: TRICARE For Life (TFL)

## 2023-09-26 ENCOUNTER — Inpatient Hospital Stay (HOSPITAL_BASED_OUTPATIENT_CLINIC_OR_DEPARTMENT_OTHER): Payer: TRICARE For Life (TFL) | Admitting: Internal Medicine

## 2023-09-26 ENCOUNTER — Ambulatory Visit: Payer: TRICARE For Life (TFL) | Admitting: Internal Medicine

## 2023-09-26 VITALS — BP 134/81 | HR 89 | Temp 97.6°F | Resp 24 | Ht 63.0 in | Wt 141.8 lb

## 2023-09-26 DIAGNOSIS — M8008XA Age-related osteoporosis with current pathological fracture, vertebra(e), initial encounter for fracture: Secondary | ICD-10-CM | POA: Insufficient documentation

## 2023-09-26 DIAGNOSIS — D490 Neoplasm of unspecified behavior of digestive system: Secondary | ICD-10-CM | POA: Diagnosis not present

## 2023-09-26 DIAGNOSIS — D649 Anemia, unspecified: Secondary | ICD-10-CM | POA: Insufficient documentation

## 2023-09-26 DIAGNOSIS — E559 Vitamin D deficiency, unspecified: Secondary | ICD-10-CM | POA: Diagnosis not present

## 2023-09-26 DIAGNOSIS — Z87891 Personal history of nicotine dependence: Secondary | ICD-10-CM | POA: Insufficient documentation

## 2023-09-26 DIAGNOSIS — M899 Disorder of bone, unspecified: Secondary | ICD-10-CM | POA: Insufficient documentation

## 2023-09-26 DIAGNOSIS — Z8501 Personal history of malignant neoplasm of esophagus: Secondary | ICD-10-CM | POA: Diagnosis not present

## 2023-09-26 DIAGNOSIS — Z85118 Personal history of other malignant neoplasm of bronchus and lung: Secondary | ICD-10-CM | POA: Diagnosis present

## 2023-09-26 LAB — CBC WITH DIFFERENTIAL (CANCER CENTER ONLY)
Abs Immature Granulocytes: 0.03 10*3/uL (ref 0.00–0.07)
Basophils Absolute: 0.1 10*3/uL (ref 0.0–0.1)
Basophils Relative: 1 %
Eosinophils Absolute: 0.2 10*3/uL (ref 0.0–0.5)
Eosinophils Relative: 3 %
HCT: 37 % (ref 36.0–46.0)
Hemoglobin: 12.5 g/dL (ref 12.0–15.0)
Immature Granulocytes: 0 %
Lymphocytes Relative: 29 %
Lymphs Abs: 2.1 10*3/uL (ref 0.7–4.0)
MCH: 30.3 pg (ref 26.0–34.0)
MCHC: 33.8 g/dL (ref 30.0–36.0)
MCV: 89.8 fL (ref 80.0–100.0)
Monocytes Absolute: 0.5 10*3/uL (ref 0.1–1.0)
Monocytes Relative: 7 %
Neutro Abs: 4.3 10*3/uL (ref 1.7–7.7)
Neutrophils Relative %: 60 %
Platelet Count: 243 10*3/uL (ref 150–400)
RBC: 4.12 MIL/uL (ref 3.87–5.11)
RDW: 12.8 % (ref 11.5–15.5)
WBC Count: 7.3 10*3/uL (ref 4.0–10.5)
nRBC: 0 % (ref 0.0–0.2)

## 2023-09-26 LAB — CMP (CANCER CENTER ONLY)
ALT: 12 U/L (ref 0–44)
AST: 15 U/L (ref 15–41)
Albumin: 4.1 g/dL (ref 3.5–5.0)
Alkaline Phosphatase: 70 U/L (ref 38–126)
Anion gap: 9 (ref 5–15)
BUN: 21 mg/dL (ref 8–23)
CO2: 27 mmol/L (ref 22–32)
Calcium: 9.9 mg/dL (ref 8.9–10.3)
Chloride: 104 mmol/L (ref 98–111)
Creatinine: 0.99 mg/dL (ref 0.44–1.00)
GFR, Estimated: 54 mL/min — ABNORMAL LOW (ref >60)
Glucose, Bld: 149 mg/dL — ABNORMAL HIGH (ref 70–99)
Potassium: 4.1 mmol/L (ref 3.5–5.1)
Sodium: 140 mmol/L (ref 135–145)
Total Bilirubin: 0.7 mg/dL (ref 0.0–1.2)
Total Protein: 8.1 g/dL (ref 6.5–8.1)

## 2023-09-26 LAB — VITAMIN D 25 HYDROXY (VIT D DEFICIENCY, FRACTURES): Vit D, 25-Hydroxy: 43.39 ng/mL (ref 30–100)

## 2023-09-26 MED ORDER — PANTOPRAZOLE SODIUM 40 MG PO TBEC: 40.0000 mg | DELAYED_RELEASE_TABLET | Freq: Every day | ORAL | 3 refills | Status: AC

## 2023-09-26 NOTE — Assessment & Plan Note (Addendum)
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    # Currently on surveillance. APRIL 2025- PET scan: No findings of active malignancy;  Stable chronic low-grade activity along the right para-mediastinal radiation fibrosis  # Asymptomatic L2 lesion [concerning for recurrence versus compression fracture/osteoporosis]- s/p Bx- NEGATIVE for any malignancy.  PET APRIL 2025-- stable; NEG for bone uptake.   # CONTINUE TO HOLD Opdivo; CONTINUE to surveillance with PET scan in 6 Months.   # OCT 2024- BMD T score: -3.1-/L2 compression fracture. Patient currently on vitamin D 2000 units+ calcium 1000 mg. Declined Reclast.Continue on vitamin D plus calcium at this time. Stable.  # Autoimmune colitis/Opdivo induced diarrhea-s/p evaluation with GI; Dr.Wohl- improved- currently  OFF prednisone -monitor for now- stable.  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  # Mild Anemia- improved- Hb 12.3 stable; continue gentle iron- space to every other day- .Stable.  # PBF-BG- 121  Monitor for now [Gluerna protein shakes]- continue OFF prednisone as above.Stable.  #Hx of goitre-/ TSH low-however normal free T3-T4- .currently on surveillance s/p evaluation with  Dr. Jesse Moritz, [facility] endocrinology.  Labs pending today.  Endo -.Stable.  # Vaccinations: ok with Flu shot; and Covid-19.; ok with RSV  #  infrarenal abdominal aortic aneurysm- table fusiform infrarenal abdominal aortic aneurysm 4.9 cm anterior-posterior. Recommend follow-up ultrasound every 6 months and vascular consultation.[ Dr.scheiner]- stable.   # IV access: PIV.   # DISPOSITION: # follow up in 6 months- MD; labs- cbc/cmp; vit D 25-OH levels;  PET scan- Dr.B  # I reviewed the blood work- with the patient in detail; also reviewed the  imaging independently [as summarized above]; and with the patient in detail.

## 2023-09-26 NOTE — Progress Notes (Signed)
 Refill pantoprazole, pended.  C/o lower legs possibly having fluid.  PET scan 09/19/23.

## 2023-09-26 NOTE — Progress Notes (Signed)
 [facility] OFFICE PROGRESS NOTE  Patient Care Team: Dale Piperton, MD as PCP - General (Internal Medicine) Benita Gutter, RN as Oncology Nurse Navigator Earna Coder, MD as Consulting Physician (Oncology) Midge Minium, MD as Consulting Physician (Gastroenterology) Borders, Daryl Eastern, NP as Nurse Practitioner (Hospice and Palliative Medicine) Keitha Butte, RN as Registered Nurse (Oncology) Pll[facility], [facility]   Cancer Staging  Neoplasm of middle third of esophagus Staging form: Esophagus - Squamous Cell Carcinoma, AJCC 8th Edition - Clinical: Stage Unknown (cTX, cN1, cM0) - Signed by Earna Coder, MD on 05/10/2021 - Pathologic: No stage assigned - Unsigned   Oncology History Overview Note  # 2015-patient is an 89 year old female with 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  Starting radiation and chemotherapy from April 14, 2014 Patient was started on carboplatinum andTaxol Herve were developed an allergic reaction to Taxol so would be changed to Abraxane 3.patient has finished 6 cycles of weekly chemotherapy with carboplatinum  and radiation therapy(May 28, 2014) 4.started on  NIVOLULAMAB because of persistent disease July 02, 2014. 5.  NIVOLULAMAB was discontinued because of persistent diarrhea in July of 2016.  August of 2016 CT scan was stable so no further chemotherapy  # AUG 25th PET- STABLE RUL MASS [radiation fibrosis; <1cm ? 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   APRIL 16th, 2023- PET scan-  Resolution of metabolic activity in the mid esophagus;   Persistent hypermetabolic activity within a subcarinal lymph node. Differential includes residual carcinoma versus reactive adenopathy.  No evidence of distant metastatic disease.  # MAY 18th, 2023- s/p EUS [Dr.Spaete]-endoscopic biopsy of the mediastinal lymph node. MAY 18th, 2023- Status post endoscopic ultrasound/biopsy of the mediastinal/subcarinal lymph node- BIOPSY POSITIVE FOR RECURRENT SQUAMOUS CELL.  # JUNE 9th, 2023- Opdivo every 2 weeks.status post Opdivoq 2 W x4 cycles- [held because of poor tolerance/diarrhea last July 23rd, 2023].  Currently on surveillance.  # SEP 3rd, 2024- PET scan-  Mild focal hypermetabolism associated with the distal esophagus, without a definitive CT correlate. Difficult to exclude disease recurrence; Increasing hypermetabolism associated with an L2 inferior endplate compression fracture. Additionally, lucency associated with the inferior endplate appears larger-see below.  Discussed the option of endoscopy now versus follow-up PET scan in about a month.  Patient interested in repeating the scan in a month and then decide if she needs to have an endoscopy.  I have informed Dr.Wohl.   # OCT 2024-Asymptomatic L2 lesion [concerning for recurrence versus compression fracture/osteoporosis]- s/p Bx- NEGATIVE for any malignancy.     # AAA/ 3.3x 3.9 Stable [Dr.Schnier] -----------------------------------------------------       History of lung cancer  Malignant neoplasm of right upper lobe of lung (HCC)  08/07/2019 Initial Diagnosis   Malignant neoplasm of right upper lobe of lung (HCC)    INTERVAL HISTORY: Ambulating with a rolling walker.  Accompanied by daughter.   Heather [facility] 89 y.o.  female pleasant patient  diagnosed squamous cell carcinoma of the midesophagus-with recurrent disease in the mediastinum  s/p Opdivo [currently on HOLD given the concern for autoimmune colitis from immunotherapy]-on surveillance is review results  of the PET scan.  Patient  states that she is doing well.  Denies any difficulty swallowing.  Denies any nausea vomiting abdominal pain.  Denies any diarrhea. She is more  up and about.  Patient currently on physical therapy.   Review of Systems  Constitutional:  Positive for malaise/fatigue. Negative for chills, diaphoresis, fever and weight loss.  HENT:  Negative for nosebleeds and sore throat.   Eyes:  Negative for double vision.  Respiratory:  Negative for hemoptysis, sputum production, shortness of breath and wheezing.   Cardiovascular:  Negative for chest pain, palpitations, orthopnea and leg swelling.  Gastrointestinal:  Negative for abdominal pain, blood in stool, constipation, diarrhea, heartburn, melena, nausea and vomiting.  Genitourinary:  Negative for dysuria, frequency and urgency.  Musculoskeletal:  Negative for back pain and joint pain.  Skin: Negative.  Negative for itching and rash.  Neurological:  Negative for dizziness, tingling, focal weakness, weakness and headaches.  Endo/Heme/Allergies:  Does not bruise/bleed easily.  Psychiatric/Behavioral:  Negative for depression. The patient is not nervous/anxious and does not have insomnia.    PAST MEDICAL HISTORY :  Past Medical History:  Diagnosis Date   Chemotherapy induced nausea and vomiting    Chicken pox    Esophageal cancer (HCC)    Hypercalcemia    familial hypocalciuric hypercalcemia   Hypercholesterolemia    Hypertension    Lung cancer (HCC)    Osteoporosis    Thyroid disease     PAST SURGICAL HISTORY :   Past Surgical History:  Procedure Laterality Date   ABDOMINAL HYSTERECTOMY     partial   CHOLECYSTECTOMY     COLONOSCOPY WITH PROPOFOL N/A 04/17/2017   Procedure: COLONOSCOPY WITH PROPOFOL;  Surgeon: Cassie Click, MD;  Location: [facility] ENDOSCOPY;  Service: Endoscopy;  Laterality: N/A;   ESOPHAGOGASTRODUODENOSCOPY (EGD) WITH PROPOFOL N/A 04/13/2021   Procedure: ESOPHAGOGASTRODUODENOSCOPY (EGD) WITH PROPOFOL;  Surgeon: Marnee Sink, MD;  Location: [facility] ENDOSCOPY;  Service: Endoscopy;  Laterality: N/A;   EUS N/A 04/22/2021   Procedure: FULL UPPER ENDOSCOPIC ULTRASOUND (EUS) RADIAL;  Surgeon: Creola Doheny, MD;  Location: [facility] ENDOSCOPY;  Service: Endoscopy;  Laterality: N/A;  [facility] needed   EUS N/A 10/28/2021   Procedure: UPPER ENDOSCOPIC ULTRASOUND (EUS) LINEAR;  Surgeon: Rayford Cake, MD;  Location: [facility] ENDOSCOPY;  Service: Gastroenterology;  Laterality: N/A;  [facility]   IR RADIOLOGIST EVAL & MGMT  03/30/2023   transvaginal hysterectomy  04/18/05   with anterior colporrhaphy    FAMILY HISTORY :   Family History  Problem Relation Age of Onset   Stroke Mother    Hypertension Mother    Prostate cancer Father    Cancer Father        prostate   Heart disease Brother        s/p CABG   Colon cancer Neg Hx     SOCIAL HISTORY:   Social History   Tobacco Use   Smoking status: Former    Current packs/day: 0.00    Types: Cigarettes    Quit date: 06/13/1997    Years since quitting: 26.3   Smokeless tobacco: Never  Vaping Use   Vaping status: Never Used  Substance Use Topics   Alcohol use: No    Alcohol/week: 0.0 standard drinks of alcohol   Drug use: No    ALLERGIES:  is allergic to paclitaxel.  MEDICATIONS:  Current Outpatient Medications  Medication Sig Dispense Refill   cholecalciferol (VITAMIN D3) 25 MCG (1000 UNIT) tablet Take 1,000  Units by mouth daily.     ferrous sulfate 325 (65 FE) MG EC tablet Take 325 mg by mouth daily with breakfast.     fluticasone (FLONASE) 50 MCG/ACT nasal spray Place 2 sprays into both nostrils daily. 16 g 3   levocetirizine (XYZAL) 5 MG tablet Take 1 tablet (5 mg total) by mouth every evening. 90 tablet 2   olmesartan-hydrochlorothiazide (BENICAR HCT) 20-12.5 MG tablet TAKE 1 TABLET BY MOUTH ONCE DAILY 90 tablet 1   nystatin cream (MYCOSTATIN) Apply 1 Application topically 2 (two) times daily. (Patient not taking: Reported on 09/26/2023) 30 g 0   pantoprazole  (PROTONIX) 40 MG tablet Take 1 tablet (40 mg total) by mouth daily. 90 tablet 3   No current facility-administered medications for this visit.    PHYSICAL EXAMINATION: ECOG PERFORMANCE STATUS: 0 - Asymptomatic  BP 134/81 (BP Location: Left Arm, Patient Position: Sitting, Cuff Size: Normal)   Pulse 89   Temp 97.6 F (36.4 C) (Tympanic)   Resp (!) 24   Ht 5\' 3"  (1.6 m)   Wt 141 lb 12.8 oz (64.3 kg)   SpO2 99%   BMI 25.12 kg/m   Filed Weights   09/26/23 0955  Weight: 141 lb 12.8 oz (64.3 kg)    Physical Exam HENT:     Head: Normocephalic and atraumatic.     Mouth/Throat:     Pharynx: No oropharyngeal exudate.  Eyes:     Pupils: Pupils are equal, round, and reactive to light.  Cardiovascular:     Rate and Rhythm: Normal rate and regular rhythm.  Pulmonary:     Effort: Pulmonary effort is normal. No respiratory distress.     Breath sounds: Normal breath sounds. No wheezing.  Abdominal:     General: Bowel sounds are normal. There is no distension.     Palpations: Abdomen is soft. There is no mass.     Tenderness: There is no abdominal tenderness. There is no guarding or rebound.  Musculoskeletal:        General: No tenderness. Normal range of motion.     Cervical back: Normal range of motion and neck supple.  Skin:    General: Skin is warm.  Neurological:     Mental Status: She is alert and oriented to person, place, and time.  Psychiatric:        Mood and Affect: Affect normal.      LABORATORY DATA:  I have reviewed the data as listed    Component Value Date/Time   NA 140 09/26/2023 0948   NA 130 (L) 10/09/2014 0846   K 4.1 09/26/2023 0948   K 4.1 10/09/2014 0846   CL 104 09/26/2023 0948   CL 98 (L) 10/09/2014 0846   CO2 27 09/26/2023 0948   CO2 25 10/09/2014 0846   GLUCOSE 149 (H) 09/26/2023 0948   GLUCOSE 161 (H) 10/09/2014 0846   BUN 21 09/26/2023 0948   BUN 14 10/09/2014 0846   CREATININE 0.99 09/26/2023 0948   CREATININE 0.70 10/09/2014 0846    CALCIUM 9.9 09/26/2023 0948   CALCIUM 9.1 10/09/2014 0846   PROT 8.1 09/26/2023 0948   PROT 7.3 10/09/2014 0846   ALBUMIN 4.1 09/26/2023 0948   ALBUMIN 3.6 10/09/2014 0846   AST 15 09/26/2023 0948   ALT 12 09/26/2023 0948   ALT 13 (L) 10/09/2014 0846   ALKPHOS 70 09/26/2023 0948   ALKPHOS 70 10/09/2014 0846   BILITOT 0.7 09/26/2023 0948   GFRNONAA 54 (L) 09/26/2023 1610  GFRNONAA >60 10/09/2014 0846   GFRAA >60 02/05/2020 1004   GFRAA >60 10/09/2014 0846    No results found for: "SPEP", "UPEP"  Lab Results  Component Value Date   WBC 7.3 09/26/2023   NEUTROABS 4.3 09/26/2023   HGB 12.5 09/26/2023   HCT 37.0 09/26/2023   MCV 89.8 09/26/2023   PLT 243 09/26/2023      Chemistry      Component Value Date/Time   NA 140 09/26/2023 0948   NA 130 (L) 10/09/2014 0846   K 4.1 09/26/2023 0948   K 4.1 10/09/2014 0846   CL 104 09/26/2023 0948   CL 98 (L) 10/09/2014 0846   CO2 27 09/26/2023 0948   CO2 25 10/09/2014 0846   BUN 21 09/26/2023 0948   BUN 14 10/09/2014 0846   CREATININE 0.99 09/26/2023 0948   CREATININE 0.70 10/09/2014 0846   GLU 111 03/18/2014 1048      Component Value Date/Time   CALCIUM 9.9 09/26/2023 0948   CALCIUM 9.1 10/09/2014 0846   ALKPHOS 70 09/26/2023 0948   ALKPHOS 70 10/09/2014 0846   AST 15 09/26/2023 0948   ALT 12 09/26/2023 0948   ALT 13 (L) 10/09/2014 0846   BILITOT 0.7 09/26/2023 0948       RADIOGRAPHIC STUDIES: I have personally reviewed the radiological images as listed and agreed with the findings in the report. No results found.    ASSESSMENT & PLAN:  Neoplasm of middle third of esophagus # Squamous cell carcinoma of the mid-esophagus [NOV 2022]-  locally advanced; RECURRENT [s/p EUS- LB Bx-] MAY 18th, 2023-most recently status post Opdivoq 2 W x4 cycles- [held because of poor tolerance/diarrhea last July 23rd, 2023].    # Currently on surveillance. APRIL 2025- PET scan: No findings of active malignancy;  Stable chronic  low-grade activity along the right para-mediastinal radiation fibrosis  # Asymptomatic L2 lesion [concerning for recurrence versus compression fracture/osteoporosis]- s/p Bx- NEGATIVE for any malignancy.  PET APRIL 2025-- stable; NEG for bone uptake.   # CONTINUE TO HOLD Opdivo; CONTINUE to surveillance with PET scan in 6 Months.   # OCT 2024- BMD T score: -3.1-/L2 compression fracture. Patient currently on vitamin D 2000 units+ calcium 1000 mg. Declined Reclast.Continue on vitamin D plus calcium at this time. Stable.  # Autoimmune colitis/Opdivo induced diarrhea-s/p evaluation with GI; Dr.Wohl- improved- currently  OFF prednisone -monitor for now- stable.  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  # Mild Anemia- improved- Hb 12.3 stable; continue gentle iron- space to every other day- .Stable.  # PBF-BG- 121  Monitor for now [Gluerna protein shakes]- continue OFF prednisone as above.Stable.  #Hx of goitre-/ TSH low-however normal free T3-T4- .currently on surveillance s/p evaluation with  Dr. Evlyn Kanner, [place] [facility].  Labs pending today.  Endo -.Stable.  # Vaccinations: ok with Flu shot; and Covid-19.; ok with RSV  #  infrarenal abdominal aortic aneurysm- table fusiform infrarenal abdominal aortic aneurysm 4.9 cm anterior-posterior. Recommend follow-up ultrasound every 6 months and vascular consultation.[ Dr.scheiner]- stable.   # IV access: PIV.   # DISPOSITION: # follow up in 6 months- MD; labs- cbc/cmp; vit D 25-OH levels;  PET scan- Dr.B  # I reviewed the blood work- with the patient in detail; also reviewed the imaging independently [as summarized above]; and with the patient in detail.     Orders Placed This Encounter  Procedures   NM PET Image Restage (PS) Skull Base to Thigh (F-18 FDG)  Standing Status:   Future    Expected Date:   03/27/2024    Expiration Date:   09/25/2024    If indicated for the ordered procedure, I authorize  the administration of a radiopharmaceutical per Radiology protocol:   Yes    Preferred imaging location?:   [facility]   CBC with Differential ([facility] Only)    Standing Status:   Future    Expected Date:   03/27/2024    Expiration Date:   09/25/2024   CMP ([facility] only)    Standing Status:   Future    Expected Date:   03/27/2024    Expiration Date:   09/25/2024   VITAMIN D 25 Hydroxy (Vit-D Deficiency, Fractures)    Standing Status:   Future    Expected Date:   03/27/2024    Expiration Date:   09/25/2024    All questions were answered. The patient knows to call the clinic with any problems, questions or concerns.      Heather Leos, MD 09/26/2023 11:16 AM

## 2023-10-05 ENCOUNTER — Ambulatory Visit: Payer: TRICARE For Life (TFL) | Admitting: Internal Medicine

## 2023-10-05 ENCOUNTER — Other Ambulatory Visit: Payer: TRICARE For Life (TFL)

## 2023-10-31 ENCOUNTER — Encounter (INDEPENDENT_AMBULATORY_CARE_PROVIDER_SITE_OTHER): Payer: Self-pay

## 2023-11-02 DIAGNOSIS — H903 Sensorineural hearing loss, bilateral: Secondary | ICD-10-CM | POA: Diagnosis not present

## 2023-11-14 ENCOUNTER — Encounter: Payer: Self-pay | Admitting: Internal Medicine

## 2023-11-14 ENCOUNTER — Ambulatory Visit (INDEPENDENT_AMBULATORY_CARE_PROVIDER_SITE_OTHER): Payer: TRICARE For Life (TFL) | Admitting: Internal Medicine

## 2023-11-14 ENCOUNTER — Ambulatory Visit: Payer: Self-pay | Admitting: Internal Medicine

## 2023-11-14 VITALS — BP 118/70 | HR 80 | Temp 98.0°F | Resp 16 | Ht 63.0 in | Wt 144.0 lb

## 2023-11-14 DIAGNOSIS — I10 Essential (primary) hypertension: Secondary | ICD-10-CM

## 2023-11-14 DIAGNOSIS — E7800 Pure hypercholesterolemia, unspecified: Secondary | ICD-10-CM

## 2023-11-14 DIAGNOSIS — I739 Peripheral vascular disease, unspecified: Secondary | ICD-10-CM

## 2023-11-14 DIAGNOSIS — C159 Malignant neoplasm of esophagus, unspecified: Secondary | ICD-10-CM

## 2023-11-14 DIAGNOSIS — E1165 Type 2 diabetes mellitus with hyperglycemia: Secondary | ICD-10-CM

## 2023-11-14 DIAGNOSIS — R7989 Other specified abnormal findings of blood chemistry: Secondary | ICD-10-CM | POA: Diagnosis not present

## 2023-11-14 DIAGNOSIS — I714 Abdominal aortic aneurysm, without rupture, unspecified: Secondary | ICD-10-CM

## 2023-11-14 DIAGNOSIS — R946 Abnormal results of thyroid function studies: Secondary | ICD-10-CM | POA: Diagnosis not present

## 2023-11-14 DIAGNOSIS — J439 Emphysema, unspecified: Secondary | ICD-10-CM

## 2023-11-14 LAB — LIPID PANEL
Cholesterol: 194 mg/dL (ref 0–200)
HDL: 46.6 mg/dL (ref >39.00)
LDL Cholesterol: 123 mg/dL — ABNORMAL HIGH (ref 0–99)
NonHDL: 147.83
Total CHOL/HDL Ratio: 4
Triglycerides: 123 mg/dL (ref 0.0–149.0)
VLDL: 24.6 mg/dL (ref 0.0–40.0)

## 2023-11-14 LAB — BASIC METABOLIC PANEL WITH GFR
BUN: 21 mg/dL (ref 6–23)
CO2: 29 meq/L (ref 19–32)
Calcium: 10.4 mg/dL (ref 8.4–10.5)
Chloride: 100 meq/L (ref 96–112)
Creatinine, Ser: 1.04 mg/dL (ref 0.40–1.20)
GFR: 47.2 mL/min — ABNORMAL LOW (ref >60.00)
Glucose, Bld: 114 mg/dL — ABNORMAL HIGH (ref 70–99)
Potassium: 4 meq/L (ref 3.5–5.1)
Sodium: 138 meq/L (ref 135–145)

## 2023-11-14 LAB — HEMOGLOBIN A1C: Hgb A1c MFr Bld: 6.4 % (ref 4.6–6.5)

## 2023-11-14 LAB — TSH: TSH: 0.59 u[IU]/mL (ref 0.35–5.50)

## 2023-11-14 LAB — T4, FREE: Free T4: 0.91 ng/dL (ref 0.60–1.60)

## 2023-11-14 LAB — T3, FREE: T3, Free: 3.5 pg/mL (ref 2.3–4.2)

## 2023-11-14 LAB — HM DIABETES FOOT EXAM

## 2023-11-14 LAB — HEPATIC FUNCTION PANEL
ALT: 8 U/L (ref 0–35)
AST: 14 U/L (ref 0–37)
Albumin: 4.4 g/dL (ref 3.5–5.2)
Alkaline Phosphatase: 65 U/L (ref 39–117)
Bilirubin, Direct: 0.1 mg/dL (ref 0.0–0.3)
Total Bilirubin: 0.5 mg/dL (ref 0.2–1.2)
Total Protein: 7.8 g/dL (ref 6.0–8.3)

## 2023-11-14 MED ORDER — OLMESARTAN MEDOXOMIL-HCTZ 20-12.5 MG PO TABS: 1.0000 | ORAL_TABLET | Freq: Every day | ORAL | 1 refills | Status: DC

## 2023-11-14 NOTE — Assessment & Plan Note (Signed)
Check lipid panel today 

## 2023-11-14 NOTE — Assessment & Plan Note (Signed)
.   Saw Dr Prescilla Brod 05/2023 - f/u AAA. Duplex US  of the aorta and iliac arteries shows an AAA measured 4.8 cm.  No significant change compared to the study last year which was 4.7 cm. Follow  12 months.

## 2023-11-14 NOTE — Assessment & Plan Note (Signed)
 Seeing Dr Valentine Gasmen for f/u esophageal cancer. Currently on surveillance. 09/2023 - PET scan - no findings of active malignancy. Stable chronic low-grade activity along the right para-mediastinal radiation fibrosis. Recommended PET scan in 6 months, continue gentle iron every other day.

## 2023-11-14 NOTE — Assessment & Plan Note (Signed)
 Has been followed by Dr Jesse Moritz.  Reports no longer seeing him. Check TSH and Free T3 and Free T4 today.

## 2023-11-14 NOTE — Assessment & Plan Note (Signed)
Not on cholesterol medication.  Continue blood pressure control.

## 2023-11-14 NOTE — Assessment & Plan Note (Signed)
 Breathing stable.

## 2023-11-14 NOTE — Assessment & Plan Note (Signed)
 Check met b and A1c today.

## 2023-11-14 NOTE — Assessment & Plan Note (Signed)
 Continue benicar /hctz.  Blood pressure has been doing well.   Follow pressures.  Check metabolic panel today. No changes in medication.

## 2023-11-14 NOTE — Progress Notes (Signed)
 Subjective:    Patient ID: Heather Cooper, female    DOB: 1931-05-26, 89 y.o.   MRN: [id number]  Patient here for  Chief Complaint  Patient presents with   Medical Management of Chronic Issues    HPI Here for a scheduled follow up - follow up regarding GERD, diabetes, hypertension and hypercholesterolemia. Seeing Dr Valentine Gasmen for f/u esophageal cancer. Currently on surveillance. 09/2023 - PET scan - no findings of active malignancy. Stable chronic low-grade activity along the right para-mediastinal radiation fibrosis. Recommended PET scan in 6 months, continue gentle iron every other day. Saw Dr Prescilla Brod 05/2023 - f/u AAA. Duplex US  of the aorta and iliac arteries shows an AAA measured 4.8 cm.  No significant change compared to the study last year which was 4.7 cm. Follow  12 months. Reports she feels she is doing relatively well. No chest pain reported. Breathing stable. No abdominal pain. Bowels stable. No diarrhea.    Past Medical History:  Diagnosis Date   Chemotherapy induced nausea and vomiting    Chicken pox    Esophageal cancer (HCC)    Hypercalcemia    familial hypocalciuric hypercalcemia   Hypercholesterolemia    Hypertension    Lung cancer (HCC)    Osteoporosis    Thyroid  disease    Past Surgical History:  Procedure Laterality Date   ABDOMINAL HYSTERECTOMY     partial   CHOLECYSTECTOMY     COLONOSCOPY WITH PROPOFOL  N/A 04/17/2017   Procedure: COLONOSCOPY WITH PROPOFOL ;  Surgeon: Cassie Click, MD;  Location: [facility] ENDOSCOPY;  Service: Endoscopy;  Laterality: N/A;   ESOPHAGOGASTRODUODENOSCOPY (EGD) WITH PROPOFOL  N/A 04/13/2021   Procedure: ESOPHAGOGASTRODUODENOSCOPY (EGD) WITH PROPOFOL ;  Surgeon: Marnee Sink, MD;  Location: [facility] ENDOSCOPY;  Service: Endoscopy;  Laterality: N/A;   EUS N/A 04/22/2021   Procedure: FULL UPPER ENDOSCOPIC ULTRASOUND (EUS) RADIAL;  Surgeon: Creola Doheny, MD;  Location: [facility] ENDOSCOPY;  Service: Endoscopy;  Laterality: N/A;  [facility]  needed   EUS N/A 10/28/2021   Procedure: UPPER ENDOSCOPIC ULTRASOUND (EUS) LINEAR;  Surgeon: Rayford Cake, MD;  Location: [facility] ENDOSCOPY;  Service: Gastroenterology;  Laterality: N/A;  [facility]   IR RADIOLOGIST EVAL & MGMT  03/30/2023   transvaginal hysterectomy  04/18/05   with anterior colporrhaphy   Family History  Problem Relation Age of Onset   Stroke Mother    Hypertension Mother    Prostate cancer Father    Cancer Father        prostate   Heart disease Brother        s/p CABG   Colon cancer Neg Hx    Social History   Socioeconomic History   Marital status: Widowed    Spouse name: Not on file   Number of children: 3   Years of education: Not on file   Highest education level: Not on file  Occupational History   Not on file  Tobacco Use   Smoking status: Former    Current packs/day: 0.00    Types: Cigarettes    Quit date: 06/13/1997    Years since quitting: 26.4   Smokeless tobacco: Never  Vaping Use   Vaping status: Never Used  Substance and Sexual Activity   Alcohol use: No    Alcohol/week: 0.0 standard drinks of alcohol   Drug use: No   Sexual activity: Not Currently  Other Topics Concern   Not on file  Social History Narrative   Lost her husband November 2019   Social Drivers of  Health   Financial Resource Strain: Low Risk  (09/12/2023)   Overall Financial Resource Strain (CARDIA)    Difficulty of Paying Living Expenses: Not hard at all  Food Insecurity: No Food Insecurity (09/12/2023)   Hunger Vital Sign    Worried About Running Out of Food in the Last Year: Never true    Ran Out of Food in the Last Year: Never true  Transportation Needs: No Transportation Needs (09/12/2023)   PRAPARE - Administrator, Civil Service (Medical): No    Lack of Transportation (Non-Medical): No  Physical Activity: Inactive (09/12/2023)   Exercise Vital Sign    Days of Exercise per Week: 0 days    Minutes of Exercise per Session: 0 min  Stress: No Stress Concern  Present (09/12/2023)   [employer] of Occupational Health - Occupational Stress Questionnaire    Feeling of Stress : Not at all  Social Connections: Moderately Isolated (09/12/2023)   Social Connection and Isolation Panel [NHANES]    Frequency of Communication with Friends and Family: More than three times a week    Frequency of Social Gatherings with Friends and Family: Three times a week    Attends Religious Services: More than 4 times per year    Active Member of Clubs or Organizations: No    Attends Banker Meetings: Never    Marital Status: Widowed     Review of Systems  Constitutional:  Negative for appetite change and unexpected weight change.  HENT:  Negative for congestion and sinus pressure.   Respiratory:  Negative for cough, chest tightness and shortness of breath.   Cardiovascular:  Negative for chest pain, palpitations and leg swelling.  Gastrointestinal:  Negative for abdominal pain, diarrhea, nausea and vomiting.  Genitourinary:  Negative for difficulty urinating and dysuria.  Musculoskeletal:  Negative for joint swelling and myalgias.  Skin:  Negative for color change and rash.  Neurological:  Negative for dizziness and headaches.  Psychiatric/Behavioral:  Negative for agitation and dysphoric mood.        Objective:     BP 118/70   Pulse 80   Temp 98 F (36.7 C)   Resp 16   Ht 5\' 3"  (1.6 m)   Wt 144 lb (65.3 kg)   SpO2 98%   BMI 25.51 kg/m  Wt Readings from Last 3 Encounters:  11/14/23 144 lb (65.3 kg)  09/26/23 141 lb 12.8 oz (64.3 kg)  09/12/23 140 lb (63.5 kg)    Physical Exam Vitals reviewed.  Constitutional:      General: She is not in acute distress.    Appearance: Normal appearance.  HENT:     Head: Normocephalic and atraumatic.     Right Ear: External ear normal.     Left Ear: External ear normal.     Mouth/Throat:     Pharynx: No oropharyngeal exudate or posterior oropharyngeal erythema.  Eyes:     General: No scleral  icterus.       Right eye: No discharge.        Left eye: No discharge.     Conjunctiva/sclera: Conjunctivae normal.  Neck:     Thyroid : No thyromegaly.  Cardiovascular:     Rate and Rhythm: Normal rate and regular rhythm.  Pulmonary:     Effort: No respiratory distress.     Breath sounds: Normal breath sounds. No wheezing.  Abdominal:     General: Bowel sounds are normal.     Palpations: Abdomen is soft.  Tenderness: There is no abdominal tenderness.  Musculoskeletal:        General: No swelling or tenderness.     Cervical back: Neck supple. No tenderness.  Lymphadenopathy:     Cervical: No cervical adenopathy.  Skin:    Findings: No erythema or rash.  Neurological:     Mental Status: She is alert.  Psychiatric:        Mood and Affect: Mood normal.        Behavior: Behavior normal.      Diabetic foot exam was performed with the following findings:   No deformities, ulcerations, or other skin breakdown Intact posterior tibialis and dorsalis pedis pulses Increasing sensitivity - moving up foot into ankle.       Outpatient Encounter Medications as of 11/14/2023  Medication Sig   cholecalciferol  (VITAMIN D3) 25 MCG (1000 UNIT) tablet Take 1,000 Units by mouth daily.   ferrous sulfate 325 (65 FE) MG EC tablet Take 325 mg by mouth daily with breakfast.   fluticasone  (FLONASE ) 50 MCG/ACT nasal spray Place 2 sprays into both nostrils daily.   levocetirizine (XYZAL ) 5 MG tablet Take 1 tablet (5 mg total) by mouth every evening.   nystatin  cream (MYCOSTATIN ) Apply 1 Application topically 2 (two) times daily. (Patient not taking: Reported on 09/26/2023)   olmesartan -hydrochlorothiazide (BENICAR  HCT) 20-12.5 MG tablet Take 1 tablet by mouth daily.   pantoprazole  (PROTONIX ) 40 MG tablet Take 1 tablet (40 mg total) by mouth daily.   [DISCONTINUED] olmesartan -hydrochlorothiazide (BENICAR  HCT) 20-12.5 MG tablet TAKE 1 TABLET BY MOUTH ONCE DAILY   [DISCONTINUED] prochlorperazine   (COMPAZINE ) 10 MG tablet Take 1 tablet (10 mg total) by mouth every 6 (six) hours as needed (Nausea or vomiting). (Patient not taking: Reported on 09/29/2021)   No facility-administered encounter medications on file as of 11/14/2023.     Lab Results  Component Value Date   WBC 7.3 09/26/2023   HGB 12.5 09/26/2023   HCT 37.0 09/26/2023   PLT 243 09/26/2023   GLUCOSE 149 (H) 09/26/2023   CHOL 171 08/26/2021   TRIG 182.0 (H) 08/26/2021   HDL 39.10 08/26/2021   LDLCALC 96 08/26/2021   ALT 12 09/26/2023   AST 15 09/26/2023   NA 140 09/26/2023   K 4.1 09/26/2023   CL 104 09/26/2023   CREATININE 0.99 09/26/2023   BUN 21 09/26/2023   CO2 27 09/26/2023   TSH 0.591 05/30/2023   INR 1.0 04/05/2023   HGBA1C 5.5 08/26/2021   MICROALBUR 5.2 (H) 01/19/2021    NM PET Image Restage (PS) Skull Base to Thigh (F-18 FDG) Result Date: 09/26/2023 CLINICAL DATA:  Subsequent treatment strategy for esophageal cancer. Prior chemo radiation therapy. EXAM: NUCLEAR MEDICINE PET SKULL BASE TO THIGH TECHNIQUE: 7.6 mCi F-18 FDG was injected intravenously. Full-ring PET imaging was performed from the skull base to thigh after the radiotracer. CT data was obtained and used for attenuation correction and anatomic localization. Fasting blood glucose: 110 mg/dl COMPARISON:  40/98/1191 FINDINGS: NECK: No hypermetabolic lymph nodes in the neck. Incidental CT findings: None. CHEST: No abnormal metabolic activity along the course of the esophagus. No hypermetabolic mediastinal lymph nodes. There is perihilar consolidation and bronchiectasis in the RIGHT lung related to radiation change. No significant metabolic activity. No suspicious pulmonary nodules. Incidental CT findings: Coronary artery calcification and aortic atherosclerotic calcification. ABDOMEN/PELVIS: No hypermetabolic gastrohepatic ligament lymph nodes. No abnormal metabolic activity liver. No hypermetabolic upper abdominal or pelvic lymph nodes. Incidental CT  findings: Post cholecystectomy extra hepatic biliary  duct dilatation not changed from prior. Infrarenal abdominal aorta dilated 4.8 cm unchanged from comparison PET-CT scan. SKELETON: No focal hypermetabolic activity to suggest skeletal metastasis. Incidental CT findings: Lytic lesion the endplate of the L 2 vertebral body is not changed from prior. Favor large Schmorl's node. IMPRESSION: 1. No evidence of esophageal cancer recurrence or metastasis. Stable exam compared to PET-CT 05/17/2023. 2. Post radiation changes in the RIGHT lung. 3. Abdominal aortic aneurysm measuring 4.8 cm. Recommend CTA or MRA, as appropriate, in 12 months and referral to vascular specialist. Reference: Journal of Vascular Surgery 67.1 (2018): 2-77. J Am Coll Radiol [phone]. Electronically Signed   By: Deboraha Fallow M.D.   On: 09/26/2023 08:50       Assessment & Plan:  Primary hypertension Assessment & Plan: Continue benicar /hctz.  Blood pressure has been doing well.   Follow pressures.  Check metabolic panel today. No changes in medication.   Orders: -     Basic metabolic panel with GFR  Hypercholesterolemia Assessment & Plan: Check lipid panel today.   Orders: -     Lipid panel -     Hepatic function panel  Type 2 diabetes mellitus with hyperglycemia, without long-term current use of insulin (HCC) Assessment & Plan: Check met b and A1c today.   Orders: -     Hemoglobin A1c  Low TSH level Assessment & Plan: Has been followed by Dr Jesse Moritz.  Reports no longer seeing him. Check TSH and Free T3 and Free T4 today.   Orders: -     TSH -     T4, free -     T3, free  Abdominal aortic aneurysm (AAA) without rupture, unspecified part [facility]) Assessment & Plan: . Saw Dr Prescilla Brod 05/2023 - f/u AAA. Duplex US  of the aorta and iliac arteries shows an AAA measured 4.8 cm.  No significant change compared to the study last year which was 4.7 cm. Follow  12 months.    Pulmonary emphysema, unspecified emphysema  type (HCC) Assessment & Plan: Breathing stable.    Malignant neoplasm of esophagus, unspecified location [facility]) Assessment & Plan: Seeing Dr Valentine Gasmen for f/u esophageal cancer. Currently on surveillance. 09/2023 - PET scan - no findings of active malignancy. Stable chronic low-grade activity along the right para-mediastinal radiation fibrosis. Recommended PET scan in 6 months, continue gentle iron every other day.    PAD (peripheral artery disease) (HCC) Assessment & Plan: Not on cholesterol medication.  Continue blood pressure control.    Other orders -     Olmesartan  Medoxomil-HCTZ; Take 1 tablet by mouth daily.  Dispense: 90 tablet; Refill: 1     Dellar Fenton, MD

## 2024-02-06 DIAGNOSIS — Z961 Presence of intraocular lens: Secondary | ICD-10-CM | POA: Diagnosis not present

## 2024-02-06 DIAGNOSIS — H353131 Nonexudative age-related macular degeneration, bilateral, early dry stage: Secondary | ICD-10-CM | POA: Diagnosis not present

## 2024-02-06 DIAGNOSIS — H4301 Vitreous prolapse, right eye: Secondary | ICD-10-CM | POA: Diagnosis not present

## 2024-02-06 DIAGNOSIS — H40023 Open angle with borderline findings, high risk, bilateral: Secondary | ICD-10-CM | POA: Diagnosis not present

## 2024-02-16 ENCOUNTER — Telehealth: Payer: Self-pay | Admitting: *Deleted

## 2024-02-16 NOTE — Telephone Encounter (Signed)
 Patient called today saying that she has had some coughing and drainage for a  2 weeks and she thought that it is allergy. Then this week she feels sore to pain under the right breast .  She has right  lung cancer.  She would like to get someone to see her next week as fast as they can she thinks

## 2024-02-19 ENCOUNTER — Inpatient Hospital Stay: Attending: Nurse Practitioner | Admitting: Nurse Practitioner

## 2024-02-19 ENCOUNTER — Encounter: Payer: Self-pay | Admitting: Internal Medicine

## 2024-02-19 VITALS — BP 136/77 | HR 86 | Temp 96.9°F | Resp 18 | Wt 142.0 lb

## 2024-02-19 DIAGNOSIS — I10 Essential (primary) hypertension: Secondary | ICD-10-CM | POA: Insufficient documentation

## 2024-02-19 DIAGNOSIS — E860 Dehydration: Secondary | ICD-10-CM | POA: Diagnosis not present

## 2024-02-19 DIAGNOSIS — B379 Candidiasis, unspecified: Secondary | ICD-10-CM | POA: Diagnosis not present

## 2024-02-19 DIAGNOSIS — Z85118 Personal history of other malignant neoplasm of bronchus and lung: Secondary | ICD-10-CM | POA: Insufficient documentation

## 2024-02-19 DIAGNOSIS — M810 Age-related osteoporosis without current pathological fracture: Secondary | ICD-10-CM | POA: Diagnosis not present

## 2024-02-19 DIAGNOSIS — B961 Klebsiella pneumoniae [K. pneumoniae] as the cause of diseases classified elsewhere: Secondary | ICD-10-CM | POA: Insufficient documentation

## 2024-02-19 DIAGNOSIS — Z87891 Personal history of nicotine dependence: Secondary | ICD-10-CM | POA: Diagnosis not present

## 2024-02-19 DIAGNOSIS — N390 Urinary tract infection, site not specified: Secondary | ICD-10-CM | POA: Insufficient documentation

## 2024-02-19 DIAGNOSIS — J069 Acute upper respiratory infection, unspecified: Secondary | ICD-10-CM | POA: Diagnosis not present

## 2024-02-19 DIAGNOSIS — E7800 Pure hypercholesterolemia, unspecified: Secondary | ICD-10-CM | POA: Insufficient documentation

## 2024-02-19 DIAGNOSIS — Z8501 Personal history of malignant neoplasm of esophagus: Secondary | ICD-10-CM | POA: Diagnosis not present

## 2024-02-19 DIAGNOSIS — Z79899 Other long term (current) drug therapy: Secondary | ICD-10-CM | POA: Insufficient documentation

## 2024-02-19 MED ORDER — AMOXICILLIN 500 MG PO CAPS: 500.0000 mg | ORAL_CAPSULE | Freq: Two times a day (BID) | ORAL | 0 refills | Status: AC

## 2024-02-19 MED ORDER — PREDNISONE 20 MG PO TABS: 20.0000 mg | ORAL_TABLET | Freq: Every day | ORAL | 0 refills | Status: AC

## 2024-02-19 NOTE — Telephone Encounter (Signed)
 I contacted patient. She stated that she can come at 1pm today to be further evaluated.  Pt added to Lauren's schedule.

## 2024-02-19 NOTE — Progress Notes (Signed)
 Cough, drainage, phlegm for 1 month. Taking xyzal . Appetite good. Bowels fine. Overall just not feeling well.

## 2024-02-19 NOTE — Progress Notes (Signed)
 Symptom Management Clinic  [facility] at [facility] A Department of the [place]. [facility] [address] [place], [place] [address] [phone] (phone) [phone] (fax)  Patient Care Team: Glendia Shad, MD as PCP - General (Internal Medicine) Maurie Rayfield BIRCH, RN as Oncology Nurse Navigator Rennie Cindy SAUNDERS, MD as Consulting Physician (Oncology) Jinny Carmine, MD as Consulting Physician (Gastroenterology) Borders, Fonda SAUNDERS, NP as Nurse Practitioner (Hospice and Palliative Medicine) Bula Powell PARAS, RN as Registered Nurse (Oncology) Roni, [facility]   Name of the patient: Heather Cooper  [id number]  Aug 19, 1930   Date of visit: 02/19/24  Diagnosis- Esophageal cancer  Chief complaint/ Reason for visit- cough  Heme/Onc history:  Oncology History Overview Note  # 2015-patient is an 89 year old female with 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  Starting radiation and chemotherapy from April 14, 2014 Patient was started on carboplatinum andTaxol Herve were developed an allergic reaction to Taxol  so would be changed to Abraxane  3.patient has finished 6 cycles of weekly chemotherapy with carboplatinum  and radiation therapy(May 28, 2014) 4.started on  NIVOLULAMAB because of persistent disease July 02, 2014. 5.  NIVOLULAMAB was discontinued because of persistent diarrhea in July of 2016.  August of 2016 CT scan was stable so no further chemotherapy  # AUG 25th PET- STABLE RUL MASS [radiation fibrosis; <1cm ? 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   APRIL 16th, 2023- PET scan-  Resolution of metabolic activity in the mid esophagus;  Persistent hypermetabolic activity within a subcarinal lymph node. Differential includes residual carcinoma versus reactive adenopathy.  No evidence of distant metastatic disease.  # MAY 18th, 2023- s/p EUS [Dr.Spaete]-endoscopic biopsy of the mediastinal lymph node. MAY 18th, 2023- Status post endoscopic ultrasound/biopsy of the mediastinal/subcarinal lymph node- BIOPSY POSITIVE FOR RECURRENT SQUAMOUS CELL.  # JUNE 9th, 2023- Opdivo  every 2 weeks.status post Opdivoq 2 W x4 cycles- [held because of poor tolerance/diarrhea last July 23rd, 2023].  Currently on surveillance.  # SEP 3rd, 2024- PET scan-  Mild focal hypermetabolism associated with the distal esophagus, without a definitive CT correlate. Difficult to exclude disease recurrence; Increasing hypermetabolism associated with an L2 inferior endplate compression fracture. Additionally, lucency associated with the inferior endplate appears larger-see below.  Discussed the option of endoscopy now versus follow-up PET scan in about a month.  Patient interested in repeating the scan in a month and then decide if she needs to have an endoscopy.  I have informed Dr.Wohl.   # OCT 2024-Asymptomatic L2 lesion [concerning for recurrence versus compression fracture/osteoporosis]- s/p Bx- NEGATIVE for any malignancy.     # AAA/ 3.3x 3.9 Stable [Dr.Schnier] -----------------------------------------------------       History of lung cancer  Malignant neoplasm of right upper lobe of lung (HCC)  08/07/2019 Initial Diagnosis   Malignant neoplasm of right upper lobe of lung (HCC)     Interval history- Heather Cooper presents with a report of symptoms of a URI.  Symptoms include right ear pain, congestion, cough, and appetite/taste changes.  Onset of symptoms was approximately 1 month ago. Have not improved with OTC  supportive care. She is staying well  hydrated. Symptoms seem to be worsening. Taking xyzal , flonase , tylenol, and delsym. Denies trouble swallowing. Intake is good. No headaches.   Review of systems- Review of Systems  Constitutional:  Positive for malaise/fatigue. Negative for chills, fever and weight loss.  HENT:  Positive for ear pain. Negative for ear discharge and hearing loss.   Respiratory:  Positive for cough and sputum production. Negative for hemoptysis, shortness of breath and wheezing.   Cardiovascular:  Negative for chest pain and palpitations.  Gastrointestinal:  Negative for abdominal pain, heartburn and nausea.  Skin:  Negative for rash.  Neurological:  Negative for weakness and headaches.     Allergies  Allergen Reactions   Paclitaxel  Other (See Comments)    Chest tightness    Past Medical History:  Diagnosis Date   Chemotherapy induced nausea and vomiting    Chicken pox    Esophageal cancer (HCC)    Hypercalcemia    familial hypocalciuric hypercalcemia   Hypercholesterolemia    Hypertension    Lung cancer (HCC)    Osteoporosis    Thyroid  disease     Past Surgical History:  Procedure Laterality Date   ABDOMINAL HYSTERECTOMY     partial   CHOLECYSTECTOMY     COLONOSCOPY WITH PROPOFOL  N/A 04/17/2017   Procedure: COLONOSCOPY WITH PROPOFOL ;  Surgeon: Viktoria Lamar DASEN, MD;  Location: [facility] ENDOSCOPY;  Service: Endoscopy;  Laterality: N/A;   ESOPHAGOGASTRODUODENOSCOPY (EGD) WITH PROPOFOL  N/A 04/13/2021   Procedure: ESOPHAGOGASTRODUODENOSCOPY (EGD) WITH PROPOFOL ;  Surgeon: Jinny Carmine, MD;  Location: [facility] ENDOSCOPY;  Service: Endoscopy;  Laterality: N/A;   EUS N/A 04/22/2021   Procedure: FULL UPPER ENDOSCOPIC ULTRASOUND (EUS) RADIAL;  Surgeon: Glena Mt, MD;  Location: [facility] ENDOSCOPY;  Service: Endoscopy;  Laterality: N/A;  [facility] needed   EUS N/A 10/28/2021   Procedure: UPPER ENDOSCOPIC ULTRASOUND (EUS) LINEAR;  Surgeon: Elta Fonda SQUIBB, MD;  Location: [facility] ENDOSCOPY;  Service:  Gastroenterology;  Laterality: N/A;  [facility]   IR RADIOLOGIST EVAL & MGMT  03/30/2023   transvaginal hysterectomy  04/18/05   with anterior colporrhaphy    Social History   Socioeconomic History   Marital status: Widowed    Spouse name: Not on file   Number of children: 3   Years of education: Not on file   Highest education level: Not on file  Occupational History   Not on file  Tobacco Use   Smoking status: Former    Current packs/day: 0.00    Types: Cigarettes    Quit date: 06/13/1997    Years since quitting: 26.7   Smokeless tobacco: Never  Vaping Use   Vaping status: Never Used  Substance and Sexual Activity   Alcohol use: No    Alcohol/week: 0.0 standard drinks of alcohol   Drug use: No   Sexual activity: Not Currently  Other Topics Concern   Not on file  Social History Narrative   Lost her husband November 2019   Social Drivers of Corporate investment banker Strain: Low Risk  (09/12/2023)   Overall Financial Resource Strain (CARDIA)    Difficulty of Paying Living Expenses: Not hard at all  Food Insecurity: No Food Insecurity (09/12/2023)   Hunger Vital Sign    Worried About Running Out of Food in the Last Year: Never true    Ran Out of Food in the Last Year: Never true  Transportation Needs: No Transportation Needs (09/12/2023)   PRAPARE - Transportation    Lack  of Transportation (Medical): No    Lack of Transportation (Non-Medical): No  Physical Activity: Inactive (09/12/2023)   Exercise Vital Sign    Days of Exercise per Week: 0 days    Minutes of Exercise per Session: 0 min  Stress: No Stress Concern Present (09/12/2023)   [employer] of Occupational Health - Occupational Stress Questionnaire    Feeling of Stress : Not at all  Social Connections: Moderately Isolated (09/12/2023)   Social Connection and Isolation Panel    Frequency of Communication with Friends and Family: More than three times a week    Frequency of Social Gatherings with Friends and  Family: Three times a week    Attends Religious Services: More than 4 times per year    Active Member of Clubs or Organizations: No    Attends Banker Meetings: Never    Marital Status: Widowed  Intimate Partner Violence: Not At Risk (09/12/2023)   Humiliation, Afraid, Rape, and Kick questionnaire    Fear of Current or Ex-Partner: No    Emotionally Abused: No    Physically Abused: No    Sexually Abused: No    Family History  Problem Relation Age of Onset   Stroke Mother    Hypertension Mother    Prostate cancer Father    Cancer Father        prostate   Heart disease Brother        s/p CABG   Colon cancer Neg Hx     Current Outpatient Medications:    calcium  carbonate (OS-CAL - DOSED IN MG OF ELEMENTAL CALCIUM ) 1250 (500 Ca) MG tablet, Take 1 tablet by mouth., Disp: , Rfl:    ferrous sulfate 325 (65 FE) MG EC tablet, Take 325 mg by mouth daily with breakfast., Disp: , Rfl:    fluticasone  (FLONASE ) 50 MCG/ACT nasal spray, Place 2 sprays into both nostrils daily., Disp: 16 g, Rfl: 3   levocetirizine (XYZAL ) 5 MG tablet, Take 1 tablet (5 mg total) by mouth every evening., Disp: 90 tablet, Rfl: 2   olmesartan -hydrochlorothiazide (BENICAR  HCT) 20-12.5 MG tablet, Take 1 tablet by mouth daily., Disp: 90 tablet, Rfl: 1   pantoprazole  (PROTONIX ) 40 MG tablet, Take 1 tablet (40 mg total) by mouth daily., Disp: 90 tablet, Rfl: 3   cholecalciferol  (VITAMIN D3) 25 MCG (1000 UNIT) tablet, Take 1,000 Units by mouth daily. (Patient not taking: Reported on 02/19/2024), Disp: , Rfl:    nystatin  cream (MYCOSTATIN ), Apply 1 Application topically 2 (two) times daily. (Patient not taking: Reported on 02/19/2024), Disp: 30 g, Rfl: 0  Physical exam:  Vitals:   02/19/24 1312 02/19/24 1319  BP:  136/77  Pulse:  86  Resp:  18  Temp:  (!) 96.9 F (36.1 C)  SpO2:  100%  Weight: 142 lb (64.4 kg)    Physical Exam Vitals reviewed.  Constitutional:      Appearance: She is not ill-appearing.   HENT:     Head: Normocephalic.     Right Ear: External ear normal. No tenderness.     Left Ear: External ear normal. No tenderness.     Nose: Congestion and rhinorrhea present.     Mouth/Throat:     Pharynx: Posterior oropharyngeal erythema present.  Eyes:     General: No scleral icterus.       Right eye: No discharge.        Left eye: No discharge.     Extraocular Movements: Extraocular movements intact.  Cardiovascular:  Rate and Rhythm: Normal rate and regular rhythm.  Pulmonary:     Effort: No respiratory distress.     Breath sounds: No stridor. No wheezing or rhonchi.  Abdominal:     General: There is no distension.  Neurological:     Mental Status: She is alert and oriented to person, place, and time.  Psychiatric:        Mood and Affect: Mood normal.        Behavior: Behavior normal.     No results found.  Assessment and plan- Patient is a 89 y.o. female with history of esophageal cancer who presents to symptom management clinic for complaints of   URI- likely viral initially but given 4 week history of symptoms, now worsening suspect bacterial etiology. Recommend amoxicillin  500 mg BID with prednisone  20 mg daily. Continue otc medications for cough and rhinorrhea. If symptoms don't improve or worsen, notify clinic for reevaluation.    Visit Diagnosis 1. Upper respiratory infection with cough and congestion     Patient expressed understanding and was in agreement with this plan. She also understands that She can call clinic at any time with any questions, concerns, or complaints.   Thank you for allowing me to participate in the care of this very pleasant patient.   Tinnie Dawn, DNP, AGNP-C, AOCNP [facility] [phone]

## 2024-03-05 ENCOUNTER — Ambulatory Visit: Payer: Self-pay

## 2024-03-05 ENCOUNTER — Ambulatory Visit
Admission: RE | Admit: 2024-03-05 | Discharge: 2024-03-05 | Disposition: A | Discharge status: Home / Self Care | Source: Ambulatory Visit | Attending: Emergency Medicine | Admitting: Emergency Medicine

## 2024-03-05 VITALS — BP 100/59 | HR 100 | Temp 97.3°F | Resp 18

## 2024-03-05 DIAGNOSIS — R051 Acute cough: Secondary | ICD-10-CM | POA: Diagnosis not present

## 2024-03-05 DIAGNOSIS — R350 Frequency of micturition: Secondary | ICD-10-CM | POA: Insufficient documentation

## 2024-03-05 LAB — POC SOFIA SARS ANTIGEN FIA: SARS Coronavirus 2 Ag: NEGATIVE

## 2024-03-05 LAB — POCT URINE DIPSTICK
Glucose, UA: NEGATIVE mg/dL
Ketones, POC UA: NEGATIVE mg/dL
Nitrite, UA: NEGATIVE
POC PROTEIN,UA: 100 — AB
Spec Grav, UA: 1.02 (ref 1.010–1.025)
Urobilinogen, UA: 1 U/dL
pH, UA: 5.5 (ref 5.0–8.0)

## 2024-03-05 LAB — GLUCOSE, POCT (MANUAL RESULT ENTRY): POCT Glucose (KUC): 149 mg/dL — AB (ref 70–99)

## 2024-03-05 MED ORDER — CEFDINIR 300 MG PO CAPS: 300.0000 mg | ORAL_CAPSULE | Freq: Two times a day (BID) | ORAL | 0 refills | Status: DC

## 2024-03-05 MED ORDER — NYSTATIN 100000 UNIT/ML MT SUSP: 5.0000 mL | Freq: Four times a day (QID) | OROMUCOSAL | 0 refills | Status: DC | PRN

## 2024-03-05 NOTE — ED Provider Notes (Signed)
 [name]    CSN: [id number] Arrival date & time: 03/05/24  1108      History   Chief Complaint Chief Complaint  Patient presents with   Urinary Frequency    Entered by patient    HPI Heather Cooper is a 89 y.o. female.   Patient presents for evaluation of urinary frequency urgency, intermittent low back pain and intermittent bilateral leg pain with weakness present for 4 days.  Has attempted use of Voltaren gel for the legs.  Denies abdominal pain, fever, hematuria or vaginal symptoms.  Patient presents for persisting mucus to the throat, nasal congestion and a productive cough.  Feels as if there is something sitting in the throat that is difficult to expel.  Has noticed a Grier Czerwinski thick coating to the tongue with mouth dryness, concern for thrush.  Was seen by her doctor and prescribed amoxicillin  and prednisone  for management of a sinus infection, endorses symptoms did improve but did not fully resolve.  Has been using over-the-counter Delsym for management of cough.  Denies shortness of breath, wheezing or new fever.  Past Medical History:  Diagnosis Date   Chemotherapy induced nausea and vomiting    Chicken pox    Esophageal cancer (HCC)    Hypercalcemia    familial hypocalciuric hypercalcemia   Hypercholesterolemia    Hypertension    Lung cancer (HCC)    Osteoporosis    Thyroid  disease     Patient Active Problem List   Diagnosis Date Noted   Encounter for immunotherapy 04/26/2022   Low TSH level 11/23/2021   PAD (peripheral artery disease) 08/28/2021   Emphysema of lung (HCC) 08/28/2021   Esophageal cancer (HCC) 05/30/2021   Diarrhea 05/30/2021   Neoplasm of middle third of esophagus 05/10/2021   Stricture and stenosis of esophagus    Dysphagia 01/22/2021   Type 2 diabetes mellitus with hyperglycemia (HCC) 01/19/2021   Anemia 05/11/2020   Malignant neoplasm of right upper lobe of lung (HCC) 08/07/2019   Health care maintenance 05/18/2016    History of lung cancer 02/08/2016   Abdominal aortic aneurysm 08/20/2015   Abnormal CXR 03/06/2014   Cough 03/06/2014   Headache 03/06/2014   Hyperglycemia 02/07/2013   Hypertension 06/26/2012   Hypercholesterolemia 06/26/2012   Osteoporosis 06/26/2012   Hypercalcemia 06/26/2012   GERD (gastroesophageal reflux disease) 06/26/2012    Past Surgical History:  Procedure Laterality Date   ABDOMINAL HYSTERECTOMY     partial   CHOLECYSTECTOMY     COLONOSCOPY WITH PROPOFOL  N/A 04/17/2017   Procedure: COLONOSCOPY WITH PROPOFOL ;  Surgeon: Viktoria Lamar DASEN, MD;  Location: [facility] ENDOSCOPY;  Service: Endoscopy;  Laterality: N/A;   ESOPHAGOGASTRODUODENOSCOPY (EGD) WITH PROPOFOL  N/A 04/13/2021   Procedure: ESOPHAGOGASTRODUODENOSCOPY (EGD) WITH PROPOFOL ;  Surgeon: Jinny Carmine, MD;  Location: [facility] ENDOSCOPY;  Service: Endoscopy;  Laterality: N/A;   EUS N/A 04/22/2021   Procedure: FULL UPPER ENDOSCOPIC ULTRASOUND (EUS) RADIAL;  Surgeon: Glena Mt, MD;  Location: [facility] ENDOSCOPY;  Service: Endoscopy;  Laterality: N/A;  [facility] needed   EUS N/A 10/28/2021   Procedure: UPPER ENDOSCOPIC ULTRASOUND (EUS) LINEAR;  Surgeon: Elta Fonda SHAUNNA, MD;  Location: [facility] ENDOSCOPY;  Service: Gastroenterology;  Laterality: N/A;  [facility]   IR RADIOLOGIST EVAL & MGMT  03/30/2023   transvaginal hysterectomy  04/18/05   with anterior colporrhaphy    OB History   No obstetric history on file.      Home Medications    Prior to Admission medications   Medication Sig  Start Date End Date Taking? Authorizing Provider  cefdinir  (OMNICEF ) 300 MG capsule Take 1 capsule (300 mg total) by mouth 2 (two) times daily. 03/05/24  Yes Ryiah Bellissimo, Shelba SAUNDERS, NP  magic mouthwash (nystatin , lidocaine , diphenhydrAMINE, alum & mag hydroxide) suspension Swish and spit 5 mLs 4 (four) times daily as needed for mouth pain. 03/05/24  Yes Aza Dantes R, NP  calcium  carbonate (OS-CAL - DOSED IN MG OF ELEMENTAL CALCIUM ) 1250 (500 Ca) MG tablet  Take 1 tablet by mouth.    [provider]  cholecalciferol  (VITAMIN D3) 25 MCG (1000 UNIT) tablet Take 1,000 Units by mouth daily. Patient not taking: Reported on 02/19/2024    [provider]  ferrous sulfate 325 (65 FE) MG EC tablet Take 325 mg by mouth daily with breakfast.    [provider]  fluticasone  (FLONASE ) 50 MCG/ACT nasal spray Place 2 sprays into both nostrils daily. 05/11/20   Glendia Shad, MD  levocetirizine (XYZAL ) 5 MG tablet Take 1 tablet (5 mg total) by mouth every evening. 05/11/20   Glendia Shad, MD  nystatin  cream (MYCOSTATIN ) Apply 1 Application topically 2 (two) times daily. Patient not taking: Reported on 02/19/2024 01/06/23   Glendia Shad, MD  olmesartan -hydrochlorothiazide (BENICAR  HCT) 20-12.5 MG tablet Take 1 tablet by mouth daily. 11/14/23   Glendia Shad, MD  pantoprazole  (PROTONIX ) 40 MG tablet Take 1 tablet (40 mg total) by mouth daily. 09/26/23   Brahmanday, Govinda R, MD  prochlorperazine  (COMPAZINE ) 10 MG tablet Take 1 tablet (10 mg total) by mouth every 6 (six) hours as needed (Nausea or vomiting). Patient not taking: Reported on 09/29/2021 05/24/21 11/04/21  Rennie Cindy SAUNDERS, MD    Family History Family History  Problem Relation Age of Onset   Stroke Mother    Hypertension Mother    Prostate cancer Father    Cancer Father        prostate   Heart disease Brother        s/p CABG   Colon cancer Neg Hx     Social History Social History   Tobacco Use   Smoking status: Former    Current packs/day: 0.00    Types: Cigarettes    Quit date: 06/13/1997    Years since quitting: 26.7   Smokeless tobacco: Never  Vaping Use   Vaping status: Never Used  Substance Use Topics   Alcohol use: No    Alcohol/week: 0.0 standard drinks of alcohol   Drug use: No     Allergies   Paclitaxel    Review of Systems Review of Systems  Genitourinary:  Positive for frequency.     Physical Exam Triage Vital Signs ED Triage  Vitals  Encounter Vitals Group     BP 03/05/24 1139 (!) 100/59     Girls Systolic BP Percentile --      Girls Diastolic BP Percentile --      Boys Systolic BP Percentile --      Boys Diastolic BP Percentile --      Pulse Rate 03/05/24 1139 100     Resp 03/05/24 1139 18     Temp 03/05/24 1139 (!) 97.3 F (36.3 C)     Temp src --      SpO2 03/05/24 1139 96 %     Weight --      Height --      Head Circumference --      Peak Flow --      Pain Score 03/05/24 1129 0  Pain Loc --      Pain Education --      Exclude from Growth Chart --    No data found.  Updated Vital Signs BP (!) 100/59   Pulse 100   Temp (!) 97.3 F (36.3 C)   Resp 18   SpO2 96%   Visual Acuity Right Eye Distance:   Left Eye Distance:   Bilateral Distance:    Right Eye Near:   Left Eye Near:    Bilateral Near:     Physical Exam Constitutional:      Appearance: Normal appearance.  HENT:     Head: Normocephalic.     Right Ear: Tympanic membrane, ear canal and external ear normal.     Left Ear: Tympanic membrane, ear canal and external ear normal.     Nose: Congestion present.     Mouth/Throat:     Mouth: Mucous membranes are moist.     Pharynx: Oropharynx is clear.  Eyes:     Extraocular Movements: Extraocular movements intact.  Cardiovascular:     Rate and Rhythm: Normal rate and regular rhythm.     Pulses: Normal pulses.     Heart sounds: Normal heart sounds.  Pulmonary:     Effort: Pulmonary effort is normal.     Breath sounds: Normal breath sounds.  Abdominal:     Tenderness: There is no abdominal tenderness. There is no right CVA tenderness, left CVA tenderness or guarding.  Neurological:     Mental Status: She is alert and oriented to person, place, and time.      UC Treatments / Results  Labs (all labs ordered are listed, but only abnormal results are displayed) Labs Reviewed  POCT URINE DIPSTICK - Abnormal; Notable for the following components:      Result Value    Clarity, UA cloudy (*)    Bilirubin, UA small (*)    Blood, UA small (*)    POC PROTEIN,UA =100 (*)    Leukocytes, UA Small (1+) (*)    All other components within normal limits  GLUCOSE, POCT (MANUAL RESULT ENTRY) - Abnormal; Notable for the following components:   POCT Glucose ([facility]) 149 (*)    All other components within normal limits  URINE CULTURE  POC SOFIA SARS ANTIGEN FIA    EKG   Radiology No results found.  Procedures Procedures (including critical care time)  Medications Ordered in UC Medications - No data to display  Initial Impression / Assessment and Plan / UC Course  I have reviewed the triage vital signs and the nursing notes.  Pertinent labs & imaging results that were available during my care of the patient were reviewed by me and considered in my medical decision making (see chart for details).  Urinary frequency, acute cough  Urinalysis showed leukocytes, negative for nitrates, sent for culture, per chart review last urine culture available showing Klebsiella pneumoniae susceptible to the cephalosporins, prescribed cefdinir , recommended nonpharmacological supportive measures with follow-up as needed  Lungs are clear to auscultation, O2 saturation 96% on room air, patient in no signs of distress nontoxic-appearing, low suspicion for pneumonia or bronchitis, denies shortness of breath and wheezing, stable for outpatient management, imaging deferred at this time, symptoms did improve with use of antibiotics but did not fully resolve, prescribed cefdinir  for management of both respiratory and urinary systems, discussed this with patient and family, additionally prescribed Magic mouthwash for mouth dryness, presentation of the tongue is not consistent with thrush, also during discussion patient  endorses that coating resolves with oral hygiene ,recommended continued use of over-the-counter Delsym, declined Tessalon if she deems ineffective, recommended  nonpharmacological measures with follow-up as needed Final Clinical Impressions(s) / UC Diagnoses   Final diagnoses:  Urinary frequency  Acute cough     Discharge Instructions      You were evaluated for your upper respiratory and urinary symptoms  On exam your lungs are clear and you are getting enough air without assistance at this time I do not believe that you have pneumonia but that your upper respiratory symptoms have not fully resolved  Urinalysis shows Aquiles Ruffini blood cells but does not show bacteria, sample has been sent to the lab for 3 days to determine if bacteria is truly present and you will be notified if this occurs  Begin cefdinir  twice daily for 7 days for both treatment of the upper respiratory symptoms and a bladder  Ensure that you are drinking adequate fluids to help with mouth dryness and to help flush out the bladder  For the coating to your tongue, thick mucus and the dryness she may use Magic mouthwash solution to help soothe the area and provide some relief  You may continue use of over-the-counter Delsym to help manage cough, if cough is worse at nighttime this is typically due to dryness of the airway, you may use a humidifier if you have 1, if you do not have 1 you may steam up the bathroom and sit inside for 10 to 15 minutes prior to bed  You may continue use of Voltaren gel for the legs, may also use Tylenol and warm compresses for additional comfort  For sore throat: try warm salt water gargles, cepacol lozenges, throat spray, warm tea or water with lemon/honey, popsicles or ice, or OTC cold relief medicine for throat discomfort.   For congestion: take a daily anti-histamine like Zyrtec, Claritin, and a oral decongestant, such as pseudoephedrine.  You can also use Flonase  1-2 sprays in each nostril daily.   It is important to stay hydrated: drink plenty of fluids (water, gatorade/powerade/pedialyte, juices, or teas) to keep your throat moisturized and help  further relieve irritation/discomfort.    ED Prescriptions     Medication Sig Dispense Auth. Provider   cefdinir  (OMNICEF ) 300 MG capsule Take 1 capsule (300 mg total) by mouth 2 (two) times daily. 10 capsule Julias Mould R, NP   magic mouthwash (nystatin , lidocaine , diphenhydrAMINE, alum & mag hydroxide) suspension Swish and spit 5 mLs 4 (four) times daily as needed for mouth pain. 180 mL Teresa Shelba SAUNDERS, NP      PDMP not reviewed this encounter.   Teresa Shelba SAUNDERS, [place] 03/05/24 [phone]

## 2024-03-05 NOTE — ED Triage Notes (Addendum)
 Patient to Urgent Care with complaints of urinary frequency/ urgency. Denies any dysuria or known fevers. Fatigue/ weakness.   Symptoms x4 days.   Recent amoxicillin  and prednisone  for drainage cough. Reports it started back up approx 3 days ago. Also w/ thrush on her tongue and throat. Negative home covid test today.   Taking zyrtec and xyzal . Attempted Voltaren gel for legs.

## 2024-03-05 NOTE — Telephone Encounter (Signed)
 FYI Only or Action Required?: FYI only for provider.  Patient was last seen in primary care on 11/14/2023 by Glendia Shad, MD.  Called Nurse Triage reporting Urinary Tract Infection.  Symptoms began several days ago.  Interventions attempted: Nothing.  Symptoms are: unchanged.  Triage Disposition: See HCP Within 4 Hours (Or PCP Triage)  Patient/caregiver understands and will follow disposition?: Yes      Copied from CRM #[id number]. Topic: Clinical - Red Word Triage >> Mar 05, 2024  9:00 AM Robinson H wrote: [facility] that prompted transfer to Nurse Triage: UTI, frequent urination, lower back pain Reason for Disposition  Side (flank) or lower back pain present  Answer Assessment - Initial Assessment Questions 1. SYMPTOM: What's the main symptom you're concerned about? (e.g., frequency, incontinence)     Frequency 2. ONSET: When did the  sx  start?     3-4 days ago 3. PAIN: Is there any pain? If Yes, ask: How bad is it? (Scale: 1-10; mild, moderate, severe)     denies 4. CAUSE: What do you think is causing the symptoms?     UTI 5. OTHER SYMPTOMS: Do you have any other symptoms? (e.g., blood in urine, fever, flank pain, pain with urination)     Lower back pain. Triager does appreciate audible SOB. Pt is speaking in full sentences. Pt reports recovering from sinus infection and still has ongoing mucus. Per chart, pt has emphysema and denies any worsening SOB above baseline.  6. PREGNANCY: Is there any chance you are pregnant? When was your last menstrual period?     N/a    [facility] attempted to schedule, but no access. Scheduled with [facility].  Protocols used: Urinary Symptoms-A-AH

## 2024-03-05 NOTE — Telephone Encounter (Signed)
Noted.  Let me know if anything more is needed.

## 2024-03-05 NOTE — Discharge Instructions (Addendum)
 You were evaluated for your upper respiratory and urinary symptoms  On exam your lungs are clear and you are getting enough air without assistance at this time I do not believe that you have pneumonia but that your upper respiratory symptoms have not fully resolved  Urinalysis shows Heather Cooper blood cells but does not show bacteria, sample has been sent to the lab for 3 days to determine if bacteria is truly present and you will be notified if this occurs  Begin cefdinir  twice daily for 7 days for both treatment of the upper respiratory symptoms and a bladder  Ensure that you are drinking adequate fluids to help with mouth dryness and to help flush out the bladder  For the coating to your tongue, thick mucus and the dryness she may use Magic mouthwash solution to help soothe the area and provide some relief  You may continue use of over-the-counter Delsym to help manage cough, if cough is worse at nighttime this is typically due to dryness of the airway, you may use a humidifier if you have 1, if you do not have 1 you may steam up the bathroom and sit inside for 10 to 15 minutes prior to bed  You may continue use of Voltaren gel for the legs, may also use Tylenol and warm compresses for additional comfort  For sore throat: try warm salt water gargles, cepacol lozenges, throat spray, warm tea or water with lemon/honey, popsicles or ice, or OTC cold relief medicine for throat discomfort.   For congestion: take a daily anti-histamine like Zyrtec, Claritin, and a oral decongestant, such as pseudoephedrine.  You can also use Flonase  1-2 sprays in each nostril daily.   It is important to stay hydrated: drink plenty of fluids (water, gatorade/powerade/pedialyte, juices, or teas) to keep your throat moisturized and help further relieve irritation/discomfort.

## 2024-03-05 NOTE — Telephone Encounter (Signed)
 FYI patient was treated at urgent care. Urine culture is pending

## 2024-03-07 ENCOUNTER — Ambulatory Visit (HOSPITAL_COMMUNITY): Payer: Self-pay

## 2024-03-07 LAB — URINE CULTURE: Culture: >=100000 — AB

## 2024-03-11 ENCOUNTER — Other Ambulatory Visit: Payer: Self-pay | Admitting: *Deleted

## 2024-03-11 ENCOUNTER — Encounter: Payer: Self-pay | Admitting: Nurse Practitioner

## 2024-03-11 ENCOUNTER — Telehealth: Payer: Self-pay | Admitting: *Deleted

## 2024-03-11 ENCOUNTER — Inpatient Hospital Stay

## 2024-03-11 ENCOUNTER — Ambulatory Visit
Admission: RE | Admit: 2024-03-11 | Discharge: 2024-03-11 | Disposition: A | Discharge status: Home / Self Care | Attending: Nurse Practitioner | Admitting: Nurse Practitioner

## 2024-03-11 ENCOUNTER — Ambulatory Visit
Admission: RE | Admit: 2024-03-11 | Discharge: 2024-03-11 | Disposition: A | Discharge status: Home / Self Care | Source: Ambulatory Visit | Attending: Nurse Practitioner | Admitting: Nurse Practitioner

## 2024-03-11 ENCOUNTER — Other Ambulatory Visit: Payer: Self-pay

## 2024-03-11 ENCOUNTER — Inpatient Hospital Stay: Admitting: Nurse Practitioner

## 2024-03-11 VITALS — BP 133/71 | HR 100 | Temp 98.2°F | Resp 18 | Ht 63.0 in | Wt 135.4 lb

## 2024-03-11 DIAGNOSIS — J069 Acute upper respiratory infection, unspecified: Secondary | ICD-10-CM | POA: Diagnosis not present

## 2024-03-11 DIAGNOSIS — R300 Dysuria: Secondary | ICD-10-CM

## 2024-03-11 DIAGNOSIS — B370 Candidal stomatitis: Secondary | ICD-10-CM | POA: Diagnosis not present

## 2024-03-11 DIAGNOSIS — B9689 Other specified bacterial agents as the cause of diseases classified elsewhere: Secondary | ICD-10-CM | POA: Insufficient documentation

## 2024-03-11 DIAGNOSIS — N390 Urinary tract infection, site not specified: Secondary | ICD-10-CM

## 2024-03-11 DIAGNOSIS — E860 Dehydration: Secondary | ICD-10-CM

## 2024-03-11 DIAGNOSIS — B961 Klebsiella pneumoniae [K. pneumoniae] as the cause of diseases classified elsewhere: Secondary | ICD-10-CM | POA: Diagnosis not present

## 2024-03-11 DIAGNOSIS — D490 Neoplasm of unspecified behavior of digestive system: Secondary | ICD-10-CM

## 2024-03-11 DIAGNOSIS — R058 Other specified cough: Secondary | ICD-10-CM | POA: Diagnosis not present

## 2024-03-11 DIAGNOSIS — Z85118 Personal history of other malignant neoplasm of bronchus and lung: Secondary | ICD-10-CM | POA: Diagnosis not present

## 2024-03-11 DIAGNOSIS — Z87891 Personal history of nicotine dependence: Secondary | ICD-10-CM | POA: Diagnosis not present

## 2024-03-11 DIAGNOSIS — R918 Other nonspecific abnormal finding of lung field: Secondary | ICD-10-CM | POA: Diagnosis not present

## 2024-03-11 DIAGNOSIS — B379 Candidiasis, unspecified: Secondary | ICD-10-CM | POA: Diagnosis not present

## 2024-03-11 DIAGNOSIS — M810 Age-related osteoporosis without current pathological fracture: Secondary | ICD-10-CM | POA: Diagnosis not present

## 2024-03-11 LAB — RESPIRATORY PANEL BY PCR

## 2024-03-11 LAB — CBC WITH DIFFERENTIAL (CANCER CENTER ONLY)
Abs Immature Granulocytes: 0.04 K/uL (ref 0.00–0.07)
Basophils Absolute: 0 K/uL (ref 0.0–0.1)
Basophils Relative: 1 %
Eosinophils Absolute: 0.2 K/uL (ref 0.0–0.5)
Eosinophils Relative: 2 %
HCT: 34.7 % — ABNORMAL LOW (ref 36.0–46.0)
Hemoglobin: 11.7 g/dL — ABNORMAL LOW (ref 12.0–15.0)
Immature Granulocytes: 1 %
Lymphocytes Relative: 21 %
Lymphs Abs: 1.8 K/uL (ref 0.7–4.0)
MCH: 29.6 pg (ref 26.0–34.0)
MCHC: 33.7 g/dL (ref 30.0–36.0)
MCV: 87.8 fL (ref 80.0–100.0)
Monocytes Absolute: 0.6 K/uL (ref 0.1–1.0)
Monocytes Relative: 7 %
Neutro Abs: 5.9 K/uL (ref 1.7–7.7)
Neutrophils Relative %: 68 %
Platelet Count: 255 K/uL (ref 150–400)
RBC: 3.95 MIL/uL (ref 3.87–5.11)
RDW: 13.2 % (ref 11.5–15.5)
WBC Count: 8.6 K/uL (ref 4.0–10.5)
nRBC: 0 % (ref 0.0–0.2)

## 2024-03-11 LAB — URINALYSIS, COMPLETE (UACMP) WITH MICROSCOPIC
Bilirubin Urine: NEGATIVE
Glucose, UA: NEGATIVE mg/dL
Hgb urine dipstick: NEGATIVE
Ketones, ur: NEGATIVE mg/dL
Nitrite: POSITIVE — AB
Protein, ur: NEGATIVE mg/dL
Specific Gravity, Urine: 1.012 (ref 1.005–1.030)
WBC, UA: >50 WBC/hpf (ref 0–5)
pH: 6 (ref 5.0–8.0)

## 2024-03-11 LAB — CMP (CANCER CENTER ONLY)
ALT: 11 U/L (ref 0–44)
AST: 16 U/L (ref 15–41)
Albumin: 4.1 g/dL (ref 3.5–5.0)
Alkaline Phosphatase: 52 U/L (ref 38–126)
Anion gap: 9 (ref 5–15)
BUN: 25 mg/dL — ABNORMAL HIGH (ref 8–23)
CO2: 26 mmol/L (ref 22–32)
Calcium: 10.1 mg/dL (ref 8.9–10.3)
Chloride: 98 mmol/L (ref 98–111)
Creatinine: 1.07 mg/dL — ABNORMAL HIGH (ref 0.44–1.00)
GFR, Estimated: 49 mL/min — ABNORMAL LOW (ref >60)
Glucose, Bld: 147 mg/dL — ABNORMAL HIGH (ref 70–99)
Potassium: 4.1 mmol/L (ref 3.5–5.1)
Sodium: 133 mmol/L — ABNORMAL LOW (ref 135–145)
Total Bilirubin: 0.9 mg/dL (ref 0.0–1.2)
Total Protein: 7.4 g/dL (ref 6.5–8.1)

## 2024-03-11 MED ORDER — NYSTATIN 100000 UNIT/ML MT SUSP: 5.0000 mL | Freq: Four times a day (QID) | OROMUCOSAL | 0 refills | Status: DC

## 2024-03-11 MED ORDER — SODIUM CHLORIDE 0.9 % IV SOLN
INTRAVENOUS | Status: AC
  Filled 2024-03-11 (×2): qty 250

## 2024-03-11 MED ORDER — ESTRADIOL 0.1 MG/GM VA CREA: TOPICAL_CREAM | VAGINAL | 12 refills | Status: AC

## 2024-03-11 MED ORDER — CEFDINIR 300 MG PO CAPS: 300.0000 mg | ORAL_CAPSULE | Freq: Two times a day (BID) | ORAL | 0 refills | Status: AC

## 2024-03-11 NOTE — Telephone Encounter (Signed)
 I called back to the daughter and she will be there at 10:30 told her she was going to have labs.  Daughter would like to do another UTI culture today as well as getting an x-ray.  I told her that she would have to talk to Lauren when she gets in there to see if she is okay with that.  Daughter understands

## 2024-03-11 NOTE — Telephone Encounter (Signed)
 Daughter called and said that she is being lethargic having aches and pains she feels that she is dehydrated and she went to urgent care couple days ago about a possibility UTI and it was positive. Per Brahmanday pt. Can see [facility].    Component Ref Range & Units (hover) 6 d ago  Specimen Description URINE, CLEAN CATCH  Special Requests NONE Performed at [facility], [address] [place], [place] [address]  Culture >=100,000 COLONIES/mL KLEBSIELLA PNEUMONIAE Abnormal   Report Status 03/07/2024 FINAL  Organism ID, Bacteria KLEBSIELLA PNEUMONIAE Abnormal   Resulting Agency [facility]

## 2024-03-11 NOTE — Progress Notes (Signed)
 Symptom Management Clinic  [facility] A Department of the [place]. [facility] [address] [place], [place] [address] [phone] (phone) [phone] (fax)  Patient Care Team: Glendia Shad, MD as PCP - General (Internal Medicine) Maurie Rayfield BIRCH, RN as Oncology Nurse Navigator Rennie Cindy SAUNDERS, MD as Consulting Physician (Oncology) Jinny Carmine, MD as Consulting Physician (Gastroenterology) Borders, Fonda SAUNDERS, NP as Nurse Practitioner (Hospice and Palliative Medicine) Bula Powell PARAS, RN as Registered Nurse (Oncology) Roni, [facility]   Name of the patient: Heather Cooper  [id number]  January 07, 1931   Date of visit: 03/11/24  Diagnosis- History of Head & Neck Cancer  Chief complaint/Reason for visit- Cough, UTI, Thrush  Heme/Onc history:  Oncology History Overview Note  # 2015-patient is an 89 year old female with 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  Starting radiation and chemotherapy from April 14, 2014 Patient was started on carboplatinum andTaxol Herve were developed an allergic reaction to Taxol  so would be changed to Abraxane  3.patient has finished 6 cycles of weekly chemotherapy with carboplatinum  and radiation therapy(May 28, 2014) 4.started on  NIVOLULAMAB because of persistent disease July 02, 2014. 5.  NIVOLULAMAB was discontinued because of persistent diarrhea in July of 2016.  August of 2016 CT scan was stable so no further chemotherapy  # AUG 25th PET- STABLE RUL MASS [radiation fibrosis; <1cm ? 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   APRIL 16th, 2023- PET scan-  Resolution of metabolic activity in the mid esophagus;  Persistent hypermetabolic activity within a subcarinal lymph node. Differential includes residual carcinoma versus reactive adenopathy.  No evidence of distant metastatic disease.  # MAY 18th, 2023- s/p EUS [Dr.Spaete]-endoscopic biopsy of the mediastinal lymph node. MAY 18th, 2023- Status post endoscopic ultrasound/biopsy of the mediastinal/subcarinal lymph node- BIOPSY POSITIVE FOR RECURRENT SQUAMOUS CELL.  # JUNE 9th, 2023- Opdivo  every 2 weeks.status post Opdivoq 2 W x4 cycles- [held because of poor tolerance/diarrhea last July 23rd, 2023].  Currently on surveillance.  # SEP 3rd, 2024- PET scan-  Mild focal hypermetabolism associated with the distal esophagus, without a definitive CT correlate. Difficult to exclude disease recurrence; Increasing hypermetabolism associated with an L2 inferior endplate compression fracture. Additionally, lucency associated with the inferior endplate appears larger-see below.  Discussed the option of endoscopy now versus follow-up PET scan in about a month.  Patient interested in repeating the scan in a month and then decide if she needs to have an endoscopy.  I have informed Dr.Wohl.   # OCT 2024-Asymptomatic L2 lesion [concerning for recurrence versus compression fracture/osteoporosis]- s/p Bx- NEGATIVE for any malignancy.     # AAA/ 3.3x 3.9 Stable [Dr.Schnier] -----------------------------------------------------       History of lung cancer  Malignant neoplasm of right upper lobe of lung (HCC)  08/07/2019 Initial Diagnosis   Malignant neoplasm of right upper lobe of lung (HCC)     Interval History- Heather Cooper is a 89 y.o. female with above history of head and neck cancer, on surveillance, who returns to clinic for concerns of recent UTI. She was seen at Urgent  Care and diagnosed with UTI, klebsiella pneumoniae, s/p cefdinir  x 5  days. Daughter concerned that her mom is still feeling poorly. Mouth pain has improved but still tender, particularly in the morning. Cough has improved but not resolved. Complains of rib aching.   Review of systems- Review of Systems  Constitutional:  Positive for malaise/fatigue and weight loss (poor appetite). Negative for chills and fever.  HENT:  Positive for sore throat. Negative for congestion, ear pain and sinus pain.   Eyes:  Negative for pain, discharge and redness.  Respiratory:  Positive for cough. Negative for hemoptysis, sputum production, shortness of breath and wheezing.   Cardiovascular:  Negative for chest pain and leg swelling.  Gastrointestinal:  Negative for abdominal pain, constipation, diarrhea, heartburn and nausea.  Genitourinary:  Positive for frequency. Negative for dysuria, hematuria and urgency.  Musculoskeletal:  Negative for falls and myalgias.  Skin:  Negative for rash.  Neurological:  Positive for weakness. Negative for dizziness, sensory change, speech change and headaches.       Seems more easily confused per daughter  Psychiatric/Behavioral:  Negative for depression. The patient is not nervous/anxious and does not have insomnia.     Allergies  Allergen Reactions   Paclitaxel  Other (See Comments)    Chest tightness   Past Medical History:  Diagnosis Date   Chemotherapy induced nausea and vomiting    Chicken pox    Esophageal cancer (HCC)    Hypercalcemia    familial hypocalciuric hypercalcemia   Hypercholesterolemia    Hypertension    Lung cancer (HCC)    Osteoporosis    Thyroid  disease    Past Surgical History:  Procedure Laterality Date   ABDOMINAL HYSTERECTOMY     partial   CHOLECYSTECTOMY     COLONOSCOPY WITH PROPOFOL  N/A 04/17/2017   Procedure: COLONOSCOPY WITH PROPOFOL ;  Surgeon: Viktoria Lamar DASEN, MD;  [facility] ENDOSCOPY;  Service: Endoscopy;  Laterality: N/A;   ESOPHAGOGASTRODUODENOSCOPY (EGD) WITH PROPOFOL  N/A 04/13/2021    Procedure: ESOPHAGOGASTRODUODENOSCOPY (EGD) WITH PROPOFOL ;  Surgeon: Jinny Carmine, MD;  Location: [facility] ENDOSCOPY;  Service: Endoscopy;  Laterality: N/A;   EUS N/A 04/22/2021   Procedure: FULL UPPER ENDOSCOPIC ULTRASOUND (EUS) RADIAL;  Surgeon: Glena Mt, MD;  Location: [facility] ENDOSCOPY;  Service: Endoscopy;  Laterality: N/A;  [facility] needed   EUS N/A 10/28/2021   Procedure: UPPER ENDOSCOPIC ULTRASOUND (EUS) LINEAR;  Surgeon: Elta Fonda SQUIBB, MD;  Location: [facility] ENDOSCOPY;  Service: Gastroenterology;  Laterality: N/A;  [facility]   IR RADIOLOGIST EVAL & MGMT  03/30/2023   transvaginal hysterectomy  04/18/05   with anterior colporrhaphy   Social History   Socioeconomic History   Marital status: Widowed    Spouse name: Not on file   Number of children: 3   Years of education: Not on file   Highest education level: Not on file  Occupational History   Not on file  Tobacco Use   Smoking status: Former    Current packs/day: 0.00    Types: Cigarettes    Quit date: 06/13/1997    Years since quitting: 26.7   Smokeless tobacco: Never  Vaping Use   Vaping status: Never Used  Substance and Sexual Activity   Alcohol use: No    Alcohol/week: 0.0 standard drinks of alcohol   Drug use: No   Sexual activity: Not Currently  Other Topics Concern   Not on file  Social History Narrative   Lost her husband November 2019   Social Drivers of Health  Financial Resource Strain: Low Risk  (09/12/2023)   Overall Financial Resource Strain (CARDIA)    Difficulty of Paying Living Expenses: Not hard at all  Food Insecurity: No Food Insecurity (09/12/2023)   Hunger Vital Sign    Worried About Running Out of Food in the Last Year: Never true    Ran Out of Food in the Last Year: Never true  Transportation Needs: No Transportation Needs (09/12/2023)   PRAPARE - Administrator, Civil Service (Medical): No    Lack of Transportation (Non-Medical): No  Physical Activity: Inactive (09/12/2023)   Exercise  Vital Sign    Days of Exercise per Week: 0 days    Minutes of Exercise per Session: 0 min  Stress: No Stress Concern Present (09/12/2023)   [employer] of Occupational Health - Occupational Stress Questionnaire    Feeling of Stress : Not at all  Social Connections: Moderately Isolated (09/12/2023)   Social Connection and Isolation Panel    Frequency of Communication with Friends and Family: More than three times a week    Frequency of Social Gatherings with Friends and Family: Three times a week    Attends Religious Services: More than 4 times per year    Active Member of Clubs or Organizations: No    Attends Banker Meetings: Never    Marital Status: Widowed  Intimate Partner Violence: Not At Risk (09/12/2023)   Humiliation, Afraid, Rape, and Kick questionnaire    Fear of Current or Ex-Partner: No    Emotionally Abused: No    Physically Abused: No    Sexually Abused: No   Family History  Problem Relation Age of Onset   Stroke Mother    Hypertension Mother    Prostate cancer Father    Cancer Father        prostate   Heart disease Brother        s/p CABG   Colon cancer Neg Hx     Current Outpatient Medications:    calcium  carbonate (OS-CAL - DOSED IN MG OF ELEMENTAL CALCIUM ) 1250 (500 Ca) MG tablet, Take 1 tablet by mouth., Disp: , Rfl:    cefdinir  (OMNICEF ) 300 MG capsule, Take 1 capsule (300 mg total) by mouth 2 (two) times daily., Disp: 10 capsule, Rfl: 0   cholecalciferol  (VITAMIN D3) 25 MCG (1000 UNIT) tablet, Take 1,000 Units by mouth daily. (Patient not taking: Reported on 02/19/2024), Disp: , Rfl:    ferrous sulfate 325 (65 FE) MG EC tablet, Take 325 mg by mouth daily with breakfast., Disp: , Rfl:    fluticasone  (FLONASE ) 50 MCG/ACT nasal spray, Place 2 sprays into both nostrils daily., Disp: 16 g, Rfl: 3   levocetirizine (XYZAL ) 5 MG tablet, Take 1 tablet (5 mg total) by mouth every evening., Disp: 90 tablet, Rfl: 2   magic mouthwash (nystatin , lidocaine ,  diphenhydrAMINE, alum & mag hydroxide) suspension, Swish and spit 5 mLs 4 (four) times daily as needed for mouth pain., Disp: 180 mL, Rfl: 0   nystatin  cream (MYCOSTATIN ), Apply 1 Application topically 2 (two) times daily. (Patient not taking: Reported on 02/19/2024), Disp: 30 g, Rfl: 0   olmesartan -hydrochlorothiazide (BENICAR  HCT) 20-12.5 MG tablet, Take 1 tablet by mouth daily., Disp: 90 tablet, Rfl: 1   pantoprazole  (PROTONIX ) 40 MG tablet, Take 1 tablet (40 mg total) by mouth daily., Disp: 90 tablet, Rfl: 3  Physical exam:  Vitals:   03/11/24 1045 03/11/24 1052  BP: (!) 143/71   Pulse: 100  Resp: 18   TempSrc: Tympanic   SpO2: 100%   Weight:  135 lb 6.4 oz (61.4 kg)  Height:  5' 3 (1.6 m)   Physical Exam Vitals reviewed.  Constitutional:      Appearance: She is not ill-appearing.  HENT:     Head:     Comments: Redness on back of throat and some evidence of white patches on back of tongue Cardiovascular:     Rate and Rhythm: Normal rate and regular rhythm.  Pulmonary:     Effort: No respiratory distress.     Breath sounds: Normal breath sounds.  Abdominal:     General: There is no distension.  Skin:    Coloration: Skin is not pale.  Neurological:     Mental Status: She is alert and oriented to person, place, and time.  Psychiatric:        Mood and Affect: Mood normal.        Behavior: Behavior normal.        Latest Ref Rng & Units 03/11/2024   10:23 AM  CMP  Glucose 70 - 99 mg/dL 852   BUN 8 - 23 mg/dL 25   Creatinine 9.55 - 1.00 mg/dL 8.92   Sodium 864 - 854 mmol/L 133   Potassium 3.5 - 5.1 mmol/L 4.1   Chloride 98 - 111 mmol/L 98   CO2 22 - 32 mmol/L 26   Calcium  8.9 - 10.3 mg/dL 89.8   Total Protein 6.5 - 8.1 g/dL 7.4   Total Bilirubin 0.0 - 1.2 mg/dL 0.9   Alkaline Phos 38 - 126 U/L 52   AST 15 - 41 U/L 16   ALT 0 - 44 U/L 11       Latest Ref Rng & Units 03/11/2024   10:23 AM  CBC  WBC 4.0 - 10.5 K/uL 8.6   Hemoglobin 12.0 - 15.0 g/dL 88.2    Hematocrit 63.9 - 46.0 % 34.7   Platelets 150 - 400 K/uL 255    No results found.  Assessment and plan- Patient is a 89 y.o. female   Cough- s/p amoxicillin  and prednisone  in early September. Continue to suspect viral etiology. Plan for RVP and Covid/Flu/RSV testing today.  Treated with cefdinir  to cover potential respiratory and urinary etiologies. RVP negative. Chest xray today. Suspect post viral etiology. No indication for antibiotics or steroids. Supportive care.  Coated Tongue/Thrush- seen in Urgent care on 03/05/24. Thought to be thrush. Possibly post viral vs post antibiotic. Prescribed magic mouthwash with nystatin . Plan to rotate to nystatin  swish and swallow.  UTI- s/p Urgent care visit. Urine Culture > 100,000 colonies of Klebsiella pneumoniae, sensitive for cephalosporins. UA today consistent with UTI. Plan to continue cefdinir  for another week. Start estrogen cream to urethra for bulking. Labs reviewed and BUN and creatinine elevated somewhat from baseline. Ok for IV fluids today.    Disposition:  Fluids today Chest xray As scheduled- la  Visit Diagnosis 1. UTI due to Klebsiella species   2. Upper respiratory infection with cough and congestion   3. Thrush    Patient expressed understanding and was in agreement with this plan. She also understands that She can call clinic at any time with any questions, concerns, or complaints.   Thank you for allowing me to participate in the care of this very pleasant patient.   Tinnie Dawn, DNP, AGNP-C, AOCNP [facility] [phone]

## 2024-03-12 ENCOUNTER — Ambulatory Visit: Payer: Self-pay | Admitting: Nurse Practitioner

## 2024-03-13 LAB — URINE CULTURE: Culture: >=100000 — AB

## 2024-03-15 ENCOUNTER — Ambulatory Visit: Payer: Self-pay | Admitting: *Deleted

## 2024-03-15 NOTE — Telephone Encounter (Signed)
-----   Message from Tinnie KANDICE Dawn sent at 03/15/2024  8:06 AM EDT ----- Regarding: FW: Please let daughter know, no evidence of pneumonia. Likely post viral cough. She had a UTI and I continued her antibiotics. ----- Message ----- From: Interface, Rad Results In Sent: 03/15/2024   1:25 AM EDT To: Tinnie KANDICE Dawn, NP

## 2024-03-26 ENCOUNTER — Ambulatory Visit
Admission: RE | Admit: 2024-03-26 | Discharge: 2024-03-26 | Disposition: A | Discharge status: Home / Self Care | Source: Ambulatory Visit | Attending: Internal Medicine | Admitting: Internal Medicine

## 2024-03-26 DIAGNOSIS — I2510 Atherosclerotic heart disease of native coronary artery without angina pectoris: Secondary | ICD-10-CM | POA: Insufficient documentation

## 2024-03-26 DIAGNOSIS — Z85118 Personal history of other malignant neoplasm of bronchus and lung: Secondary | ICD-10-CM | POA: Diagnosis not present

## 2024-03-26 DIAGNOSIS — J439 Emphysema, unspecified: Secondary | ICD-10-CM | POA: Insufficient documentation

## 2024-03-26 DIAGNOSIS — D490 Neoplasm of unspecified behavior of digestive system: Secondary | ICD-10-CM | POA: Insufficient documentation

## 2024-03-26 DIAGNOSIS — K7460 Unspecified cirrhosis of liver: Secondary | ICD-10-CM | POA: Diagnosis not present

## 2024-03-26 DIAGNOSIS — Z9221 Personal history of antineoplastic chemotherapy: Secondary | ICD-10-CM | POA: Diagnosis not present

## 2024-03-26 DIAGNOSIS — I7143 Infrarenal abdominal aortic aneurysm, without rupture: Secondary | ICD-10-CM | POA: Diagnosis not present

## 2024-03-26 DIAGNOSIS — I700 Atherosclerosis of aorta: Secondary | ICD-10-CM | POA: Diagnosis not present

## 2024-03-26 DIAGNOSIS — C159 Malignant neoplasm of esophagus, unspecified: Secondary | ICD-10-CM | POA: Diagnosis not present

## 2024-03-26 DIAGNOSIS — D499 Neoplasm of unspecified behavior of unspecified site: Secondary | ICD-10-CM | POA: Diagnosis present

## 2024-03-26 LAB — GLUCOSE, CAPILLARY: Glucose-Capillary: 136 mg/dL — ABNORMAL HIGH (ref 70–99)

## 2024-03-26 MED ORDER — FLUDEOXYGLUCOSE F - 18 (FDG) INJECTION
7.7000 | Freq: Once | INTRAVENOUS | Status: AC | PRN
  Administered 2024-03-26: 7.7 via INTRAVENOUS

## 2024-04-09 ENCOUNTER — Telehealth: Payer: Self-pay | Admitting: *Deleted

## 2024-04-09 ENCOUNTER — Inpatient Hospital Stay: Attending: Nurse Practitioner

## 2024-04-09 ENCOUNTER — Other Ambulatory Visit: Payer: Self-pay

## 2024-04-09 ENCOUNTER — Ambulatory Visit: Admitting: Internal Medicine

## 2024-04-09 ENCOUNTER — Encounter: Payer: Self-pay | Admitting: Nurse Practitioner

## 2024-04-09 ENCOUNTER — Other Ambulatory Visit

## 2024-04-09 ENCOUNTER — Inpatient Hospital Stay (HOSPITAL_BASED_OUTPATIENT_CLINIC_OR_DEPARTMENT_OTHER): Admitting: Nurse Practitioner

## 2024-04-09 ENCOUNTER — Other Ambulatory Visit: Payer: Self-pay | Admitting: Lab

## 2024-04-09 VITALS — BP 121/75 | HR 109 | Temp 98.5°F | Resp 18 | Ht 63.0 in | Wt 131.1 lb

## 2024-04-09 DIAGNOSIS — Z8501 Personal history of malignant neoplasm of esophagus: Secondary | ICD-10-CM

## 2024-04-09 DIAGNOSIS — N390 Urinary tract infection, site not specified: Secondary | ICD-10-CM | POA: Insufficient documentation

## 2024-04-09 DIAGNOSIS — D649 Anemia, unspecified: Secondary | ICD-10-CM | POA: Insufficient documentation

## 2024-04-09 DIAGNOSIS — D490 Neoplasm of unspecified behavior of digestive system: Secondary | ICD-10-CM

## 2024-04-09 DIAGNOSIS — Z08 Encounter for follow-up examination after completed treatment for malignant neoplasm: Secondary | ICD-10-CM

## 2024-04-09 DIAGNOSIS — M8008XA Age-related osteoporosis with current pathological fracture, vertebra(e), initial encounter for fracture: Secondary | ICD-10-CM | POA: Diagnosis not present

## 2024-04-09 DIAGNOSIS — Z9049 Acquired absence of other specified parts of digestive tract: Secondary | ICD-10-CM | POA: Insufficient documentation

## 2024-04-09 LAB — CMP (CANCER CENTER ONLY)
ALT: 12 U/L (ref 0–44)
AST: 17 U/L (ref 15–41)
Albumin: 4.1 g/dL (ref 3.5–5.0)
Alkaline Phosphatase: 59 U/L (ref 38–126)
Anion gap: 13 (ref 5–15)
BUN: 16 mg/dL (ref 8–23)
CO2: 25 mmol/L (ref 22–32)
Calcium: 10.1 mg/dL (ref 8.9–10.3)
Chloride: 95 mmol/L — ABNORMAL LOW (ref 98–111)
Creatinine: 0.96 mg/dL (ref 0.44–1.00)
GFR, Estimated: 56 mL/min — ABNORMAL LOW (ref >60)
Glucose, Bld: 148 mg/dL — ABNORMAL HIGH (ref 70–99)
Potassium: 3.9 mmol/L (ref 3.5–5.1)
Sodium: 133 mmol/L — ABNORMAL LOW (ref 135–145)
Total Bilirubin: 1 mg/dL (ref 0.0–1.2)
Total Protein: 7.9 g/dL (ref 6.5–8.1)

## 2024-04-09 LAB — URINALYSIS, COMPLETE (UACMP) WITH MICROSCOPIC
Bilirubin Urine: NEGATIVE
Glucose, UA: NEGATIVE mg/dL
Hgb urine dipstick: NEGATIVE
Ketones, ur: NEGATIVE mg/dL
Nitrite: POSITIVE — AB
Protein, ur: 100 mg/dL — AB
Specific Gravity, Urine: 1.019 (ref 1.005–1.030)
WBC, UA: >50 WBC/hpf (ref 0–5)
pH: 5 (ref 5.0–8.0)

## 2024-04-09 LAB — CBC WITH DIFFERENTIAL (CANCER CENTER ONLY)
Abs Immature Granulocytes: 0.04 K/uL (ref 0.00–0.07)
Basophils Absolute: 0.1 K/uL (ref 0.0–0.1)
Basophils Relative: 1 %
Eosinophils Absolute: 0.2 K/uL (ref 0.0–0.5)
Eosinophils Relative: 2 %
HCT: 33.3 % — ABNORMAL LOW (ref 36.0–46.0)
Hemoglobin: 11.3 g/dL — ABNORMAL LOW (ref 12.0–15.0)
Immature Granulocytes: 0 %
Lymphocytes Relative: 20 %
Lymphs Abs: 1.9 K/uL (ref 0.7–4.0)
MCH: 29.6 pg (ref 26.0–34.0)
MCHC: 33.9 g/dL (ref 30.0–36.0)
MCV: 87.2 fL (ref 80.0–100.0)
Monocytes Absolute: 0.8 K/uL (ref 0.1–1.0)
Monocytes Relative: 8 %
Neutro Abs: 6.8 K/uL (ref 1.7–7.7)
Neutrophils Relative %: 69 %
Platelet Count: 265 K/uL (ref 150–400)
RBC: 3.82 MIL/uL — ABNORMAL LOW (ref 3.87–5.11)
RDW: 13.2 % (ref 11.5–15.5)
WBC Count: 9.8 K/uL (ref 4.0–10.5)
nRBC: 0 % (ref 0.0–0.2)

## 2024-04-09 LAB — VITAMIN D 25 HYDROXY (VIT D DEFICIENCY, FRACTURES): Vit D, 25-Hydroxy: 43.03 ng/mL (ref 30–100)

## 2024-04-09 MED ORDER — NITROFURANTOIN MONOHYD MACRO 100 MG PO CAPS: 100.0000 mg | ORAL_CAPSULE | Freq: Two times a day (BID) | ORAL | 0 refills | Status: AC

## 2024-04-09 NOTE — Progress Notes (Signed)
 She decreased her iron to every other day due to constipation.  Daughter concerned with pt losing weight.  PET 03/26/24.

## 2024-04-09 NOTE — Telephone Encounter (Signed)
 RN called and spoke with patient and informed her that she does have a urinary tract infection and the provider has ordered an antibiotic for her to start and has been sent to pharmacy.  RN also instructed patient that NP wanted her to make an appointment with her urologist since she has had frequent UTI's.  Pt verbalized understanding.

## 2024-04-09 NOTE — Progress Notes (Signed)
 [name] OFFICE PROGRESS NOTE  Patient Care Team: Glendia Shad, MD as PCP - General (Internal Medicine) Maurie Rayfield BIRCH, RN as Oncology Nurse Navigator Rennie Cindy SAUNDERS, MD as Consulting Physician (Oncology) Jinny Carmine, MD as Consulting Physician (Gastroenterology) Borders, Fonda SAUNDERS, NP as Nurse Practitioner (Hospice and Palliative Medicine) Bula Powell PARAS, RN as Registered Nurse (Oncology) Pllc, [facility]   Cancer Staging  Neoplasm of middle third of esophagus Staging form: Esophagus - Squamous Cell Carcinoma, AJCC 8th Edition - Clinical: Stage Unknown (cTX, cN1, cM0) - Signed by Rennie Cindy SAUNDERS, MD on 05/10/2021 - Pathologic: No stage assigned - Unsigned   Oncology History Overview Note  # 2015-patient is an 89 year old female with 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  Starting radiation and chemotherapy from April 14, 2014 Patient was started on carboplatinum andTaxol Herve were developed an allergic reaction to Taxol  so would be changed to Abraxane  3.patient has finished 6 cycles of weekly chemotherapy with carboplatinum  and radiation therapy(May 28, 2014) 4.started on  NIVOLULAMAB because of persistent disease July 02, 2014. 5.  NIVOLULAMAB was discontinued because of persistent diarrhea in July of 2016.  August of 2016 CT scan was stable so no further chemotherapy  # AUG 25th PET- STABLE RUL MASS [radiation fibrosis; <1cm ? 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   APRIL 16th, 2023- PET scan-  Resolution of metabolic activity in the mid esophagus;   Persistent hypermetabolic activity within a subcarinal lymph node. Differential includes residual carcinoma versus reactive adenopathy.  No evidence of distant metastatic disease.  # MAY 18th, 2023- s/p EUS [Dr.Spaete]-endoscopic biopsy of the mediastinal lymph node. MAY 18th, 2023- Status post endoscopic ultrasound/biopsy of the mediastinal/subcarinal lymph node- BIOPSY POSITIVE FOR RECURRENT SQUAMOUS CELL.  # JUNE 9th, 2023- Opdivo  every 2 weeks.status post Opdivoq 2 W x4 cycles- [held because of poor tolerance/diarrhea last July 23rd, 2023].  Currently on surveillance.  # SEP 3rd, 2024- PET scan-  Mild focal hypermetabolism associated with the distal esophagus, without a definitive CT correlate. Difficult to exclude disease recurrence; Increasing hypermetabolism associated with an L2 inferior endplate compression fracture. Additionally, lucency associated with the inferior endplate appears larger-see below.  Discussed the option of endoscopy now versus follow-up PET scan in about a month.  Patient interested in repeating the scan in a month and then decide if she needs to have an endoscopy.  I have informed Dr.Wohl.   # OCT 2024-Asymptomatic L2 lesion [concerning for recurrence versus compression fracture/osteoporosis]- s/p Bx- NEGATIVE for any malignancy.     # AAA/ 3.3x 3.9 Stable [Dr.Schnier] -----------------------------------------------------       History of lung cancer  Malignant neoplasm of right upper lobe of lung (HCC)  08/07/2019 Initial Diagnosis   Malignant neoplasm of right upper lobe of lung (HCC)    INTERVAL HISTORY: Ambulating with a rolling walker.  Accompanied by daughter.   Heather Cooper 89 y.o.  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  She states that she is clinically doing well without any difficulty swallowing.  No new lumps or bumps.  Her weight is down 4 pounds but she says appetite is good.  Remains active.  She complains of pelvic pressure and would like to have a UA checked today.  Review of Systems  Constitutional:  Positive for malaise/fatigue. Negative for chills, diaphoresis, fever and weight loss.  HENT:  Negative for nosebleeds and sore throat.   Eyes:  Negative for double vision.  Respiratory:  Negative for hemoptysis, sputum production, shortness of breath and wheezing.   Cardiovascular:  Negative for chest pain, palpitations, orthopnea and leg swelling.  Gastrointestinal:  Negative for abdominal pain, blood in stool, constipation, diarrhea, heartburn, melena, nausea and vomiting.  Genitourinary:  Negative for dysuria, frequency and urgency.  Musculoskeletal:  Negative for back pain and joint pain.  Skin: Negative.  Negative for itching and rash.  Neurological:  Negative for dizziness, tingling, focal weakness, weakness and headaches.  Endo/Heme/Allergies:  Does not bruise/bleed easily.  Psychiatric/Behavioral:  Negative for depression. The patient is not nervous/anxious and does not have insomnia.    PAST MEDICAL HISTORY :  Past Medical History:  Diagnosis Date   Chemotherapy induced nausea and vomiting    Chicken pox    Esophageal cancer (HCC)    Hypercalcemia    familial hypocalciuric hypercalcemia   Hypercholesterolemia    Hypertension    Lung cancer (HCC)    Osteoporosis    Thyroid  disease     PAST SURGICAL HISTORY :   Past Surgical History:  Procedure Laterality Date   ABDOMINAL HYSTERECTOMY     partial   CHOLECYSTECTOMY     COLONOSCOPY WITH PROPOFOL  N/A 04/17/2017   Procedure: COLONOSCOPY WITH PROPOFOL ;  Surgeon: Viktoria Lamar DASEN, MD;  Location: [facility] ENDOSCOPY;  Service: Endoscopy;  Laterality: N/A;   ESOPHAGOGASTRODUODENOSCOPY (EGD)  WITH PROPOFOL  N/A 04/13/2021   Procedure: ESOPHAGOGASTRODUODENOSCOPY (EGD) WITH PROPOFOL ;  Surgeon: Jinny Carmine, MD;  Location: [facility] ENDOSCOPY;  Service: Endoscopy;  Laterality: N/A;   EUS N/A 04/22/2021   Procedure: FULL UPPER ENDOSCOPIC ULTRASOUND (EUS) RADIAL;  Surgeon: Glena Mt, MD;  Location: [facility] ENDOSCOPY;  Service: Endoscopy;  Laterality: N/A;  [facility] needed   EUS N/A 10/28/2021   Procedure: UPPER ENDOSCOPIC ULTRASOUND (EUS) LINEAR;  Surgeon: Elta Fonda SQUIBB, MD;  Location: [facility] ENDOSCOPY;  Service: Gastroenterology;  Laterality: N/A;  [facility]   IR RADIOLOGIST EVAL & MGMT  03/30/2023   transvaginal hysterectomy  04/18/05   with anterior colporrhaphy    FAMILY HISTORY :   Family History  Problem Relation Age of Onset   Stroke Mother    Hypertension Mother    Prostate cancer Father    Cancer Father        prostate   Heart disease Brother        s/p CABG   Colon cancer Neg Hx     SOCIAL HISTORY:   Social History   Tobacco Use   Smoking status: Former    Current packs/day: 0.00    Types: Cigarettes    Quit date: 06/13/1997    Years since quitting: 26.8   Smokeless tobacco: Never  Vaping Use   Vaping status: Never Used  Substance Use Topics   Alcohol use: No    Alcohol/week: 0.0 standard drinks of alcohol   Drug use: No    ALLERGIES:  is allergic to paclitaxel .  MEDICATIONS:  Current Outpatient Medications  Medication Sig Dispense Refill   calcium  carbonate (OS-CAL - DOSED IN MG OF ELEMENTAL CALCIUM ) 1250 (500 Ca) MG tablet Take 1 tablet by mouth.     estradiol (ESTRACE VAGINAL) 0.1 MG/GM vaginal cream Apply pea sized volume of cream to urethra at night for 2 weeks then 3 nights a week. To help prevent UTI. 42.5 g 12   ferrous sulfate 325 (65 FE) MG EC tablet Take 325 mg by mouth daily with breakfast. (Patient taking differently: Take 325 mg by mouth every other day.)     fluticasone  (FLONASE ) 50 MCG/ACT nasal spray Place 2 sprays into both nostrils daily. 16  g 3   levocetirizine (XYZAL ) 5 MG tablet Take 1 tablet (5 mg total) by mouth every evening. 90 tablet 2   olmesartan -hydrochlorothiazide (BENICAR  HCT) 20-12.5 MG tablet Take 1 tablet by mouth daily. 90 tablet 1   pantoprazole  (PROTONIX ) 40 MG tablet Take 1 tablet (40 mg total) by mouth daily. 90 tablet 3   No current facility-administered medications for this visit.    PHYSICAL EXAMINATION: ECOG PERFORMANCE STATUS: 0 - Asymptomatic  BP 121/75 (BP Location: Left Arm, Patient Position: Sitting, Cuff Size: Normal)   Pulse (!) 109   Temp 98.5 F (36.9 C) (Tympanic)   Resp 18   Ht 5' 3 (1.6 m)   Wt 131 lb 1.6 oz (59.5 kg)   SpO2 100%   BMI 23.22 kg/m   Filed Weights   04/09/24 0923  Weight: 131 lb 1.6 oz (59.5 kg)   Physical Exam HENT:     Head: Normocephalic and atraumatic.     Mouth/Throat:     Pharynx: No oropharyngeal exudate.  Eyes:     Pupils: Pupils are equal, round, and reactive to light.  Cardiovascular:     Rate and Rhythm: Normal rate and regular rhythm.  Pulmonary:     Effort: Pulmonary effort is normal. No respiratory distress.     Breath sounds: Normal breath sounds. No wheezing.  Abdominal:     General: Bowel sounds are normal. There is no distension.     Palpations: Abdomen is soft. There is no mass.     Tenderness: There is no abdominal tenderness. There is no guarding or rebound.  Musculoskeletal:        General: No tenderness. Normal range of motion.     Cervical back: Normal range of motion and neck supple.  Skin:    General: Skin is warm.  Neurological:     Mental Status: She is alert and oriented to person, place, and time.  Psychiatric:        Mood and Affect: Affect normal.    LABORATORY DATA:  I have reviewed the data as listed    Component Value Date/Time   NA 133 (L) 04/09/2024 0910   NA 130 (L) 10/09/2014 0846   K 3.9 04/09/2024 0910   K 4.1 10/09/2014 0846   CL 95 (L) 04/09/2024 0910   CL 98 (L) 10/09/2014 0846   CO2 25  04/09/2024 0910   CO2 25 10/09/2014 0846   GLUCOSE 148 (H) 04/09/2024 0910   GLUCOSE 161 (H) 10/09/2014 0846   BUN 16 04/09/2024 0910   BUN 14 10/09/2014 0846   CREATININE 0.96 04/09/2024 0910   CREATININE 0.70 10/09/2014 0846   CALCIUM  10.1 04/09/2024 0910   CALCIUM  9.1 10/09/2014 0846   PROT 7.9 04/09/2024 0910   PROT 7.3 10/09/2014 0846   ALBUMIN 4.1 04/09/2024 0910   ALBUMIN 3.6  10/09/2014 0846   AST 17 04/09/2024 0910   ALT 12 04/09/2024 0910   ALT 13 (L) 10/09/2014 0846   ALKPHOS 59 04/09/2024 0910   ALKPHOS 70 10/09/2014 0846   BILITOT 1.0 04/09/2024 0910   GFRNONAA 56 (L) 04/09/2024 0910   GFRNONAA >60 10/09/2014 0846   GFRAA >60 02/05/2020 1004   GFRAA >60 10/09/2014 0846   Lab Results  Component Value Date   WBC 9.8 04/09/2024   NEUTROABS 6.8 04/09/2024   HGB 11.3 (L) 04/09/2024   HCT 33.3 (L) 04/09/2024   MCV 87.2 04/09/2024   PLT 265 04/09/2024     Chemistry      Component Value Date/Time   NA 133 (L) 04/09/2024 0910   NA 130 (L) 10/09/2014 0846   K 3.9 04/09/2024 0910   K 4.1 10/09/2014 0846   CL 95 (L) 04/09/2024 0910   CL 98 (L) 10/09/2014 0846   CO2 25 04/09/2024 0910   CO2 25 10/09/2014 0846   BUN 16 04/09/2024 0910   BUN 14 10/09/2014 0846   CREATININE 0.96 04/09/2024 0910   CREATININE 0.70 10/09/2014 0846   GLU 111 03/18/2014 1048      Component Value Date/Time   CALCIUM  10.1 04/09/2024 0910   CALCIUM  9.1 10/09/2014 0846   ALKPHOS 59 04/09/2024 0910   ALKPHOS 70 10/09/2014 0846   AST 17 04/09/2024 0910   ALT 12 04/09/2024 0910   ALT 13 (L) 10/09/2014 0846   BILITOT 1.0 04/09/2024 0910      RADIOGRAPHIC STUDIES: I have personally reviewed the radiological images as listed and agreed with the findings in the report. No results found.    ASSESSMENT & PLAN:   Neoplasm of middle third of esophagus # Squamous cell carcinoma of the mid-esophagus [NOV 2022]-  locally advanced; RECURRENT [s/p EUS- LB Bx-] MAY 18th, 2023-most recently  status post Opdivoq 2 W x4 cycles- [held because of poor tolerance/diarrhea last July 23rd, 2023].     # Currently on surveillance. APRIL 2025- PET scan: No findings of active malignancy;  Stable chronic low-grade activity along the right para-mediastinal radiation fibrosis. PET Oct 2025 reviewed with patient and independently. Low grade activity has now resolved. nO abnormal hypermetabolism of the neck, chest, abdomen, bones, or pelvis.    # Asymptomatic L2 lesion [concerning for recurrence versus compression fracture/osteoporosis]- s/p Bx- NEGATIVE for any malignancy.  PET APRIL 2025-- stable; NEG for bone uptake. Repeat PET October 2025 was negative for bone uptake. Vit d pending today. Stable.    # CONTINUE TO HOLD Opdivo ; CONTINUE to surveillance with PET scan in 6 Months.    # OCT 2024- BMD T score: -3.1-/L2 compression fracture. Patient currently on vitamin D  2000 units+ calcium  1000 mg. Declined Reclast. Continue on vitamin D  plus calcium  at this time. Stable.    # Autoimmune colitis/Opdivo  induced diarrhea-s/p evaluation with GI; Dr.Wohl- improved- currently  OFF prednisone  -monitor for now- stable.   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   # Mild Anemia- improved- Hb 12.3 stable; continue gentle iron- space to every other day- .Stable.   # PBF-BG- 121  Monitor for now [Gluerna protein shakes]- continue OFF prednisone  as above.Stable.   #Hx of goiter-/ TSH low-however normal free T3-T4- currently on surveillance s/p evaluation with Dr. Nichole, [facility] endocrinology. Endo -.Stable.  # UTI-urinalysis today consistent with urinary tract infection.  She has had recurrent UTIs recently despite topical estrogen.  Recommend evaluation with urology for management.  Plan  to start antibiotics today.  Add on culture and sensitivity.   # Vaccinations: ok with Flu shot; and Covid-19.; ok with RSV   #  infrarenal abdominal aortic aneurysm- table fusiform  infrarenal abdominal aortic aneurysm 4.9 cm anterior-posterior. Recommend follow-up ultrasound every 6 months and vascular consultation.[ Dr. Adrienne- stable.    # IV access: PIV.    # DISPOSITION: 6 mo- labs (cbc, cmp, vit d), Dr Rennie- la  No problem-specific Assessment & Plan notes found for this encounter.  No orders of the defined types were placed in this encounter.  All questions were answered. The patient knows to call the clinic with any problems, questions or concerns.    Heather KANDICE Dawn, NP 04/09/2024

## 2024-04-10 ENCOUNTER — Other Ambulatory Visit: Payer: Self-pay

## 2024-04-11 LAB — URINE CULTURE: Culture: >=100000 — AB

## 2024-04-15 ENCOUNTER — Telehealth: Payer: Self-pay

## 2024-04-15 DIAGNOSIS — N390 Urinary tract infection, site not specified: Secondary | ICD-10-CM

## 2024-04-15 NOTE — Telephone Encounter (Signed)
 Patient was referred to [facility] on 04/09/24 by Tinnie LABOR, NP for recurrent UTIs.  Patient's daughter called requesting referral to see Dr. Redell Burnet instead.  Referral entered requesting specific MD.

## 2024-04-18 ENCOUNTER — Other Ambulatory Visit: Payer: Self-pay | Admitting: Nurse Practitioner

## 2024-04-18 DIAGNOSIS — Z08 Encounter for follow-up examination after completed treatment for malignant neoplasm: Secondary | ICD-10-CM

## 2024-04-18 NOTE — Progress Notes (Signed)
 Dr Rennie recommends repeat PET in 6 months. Order placed. Schedule message sent.

## 2024-04-22 ENCOUNTER — Ambulatory Visit: Admitting: Urology

## 2024-04-30 ENCOUNTER — Other Ambulatory Visit
Admission: RE | Admit: 2024-04-30 | Discharge: 2024-04-30 | Disposition: A | Discharge status: Home / Self Care | Source: Ambulatory Visit | Attending: Urology | Admitting: Urology

## 2024-04-30 ENCOUNTER — Ambulatory Visit (INDEPENDENT_AMBULATORY_CARE_PROVIDER_SITE_OTHER): Admitting: Urology

## 2024-04-30 VITALS — BP 116/70 | HR 104 | Wt 130.0 lb

## 2024-04-30 DIAGNOSIS — N390 Urinary tract infection, site not specified: Secondary | ICD-10-CM | POA: Insufficient documentation

## 2024-04-30 LAB — URINALYSIS, COMPLETE (UACMP) WITH MICROSCOPIC
Bilirubin Urine: NEGATIVE
Glucose, UA: NEGATIVE mg/dL
Hgb urine dipstick: NEGATIVE
Ketones, ur: NEGATIVE mg/dL
Nitrite: NEGATIVE
Protein, ur: NEGATIVE mg/dL
Specific Gravity, Urine: 1.016 (ref 1.005–1.030)
pH: 5 (ref 5.0–8.0)

## 2024-04-30 NOTE — Progress Notes (Signed)
 04/30/24 2:10 PM   Heather Cooper Feb 06, 1931 [id number]  CC: Recurrent UTI  HPI: 89 year old female here with her daughter today who is a teacher, early years/pre.  Her daughter provides much of the history.  She has been treated for both lung cancer and esophageal cancer, no evidence of recurrent disease on recent PET scan.  Was originally diagnosed with a UTI September 2025 with symptoms of urinary urgency/frequency, leg pain,  fogginess and treated with 5 days of Ceftin.  Symptoms did not completely resolve, and a follow-up culture showed E. coli and she was treated with a longer course of antibiotics.  Reported some pelvic pressure in October 2025 and culture again showed E. coli, after those antibiotics feels she has done well.   PMH: Past Medical History:  Diagnosis Date   Chemotherapy induced nausea and vomiting    Chicken pox    Esophageal cancer (HCC)    Hypercalcemia    familial hypocalciuric hypercalcemia   Hypercholesterolemia    Hypertension    Lung cancer (HCC)    Osteoporosis    Thyroid  disease     Surgical History: Past Surgical History:  Procedure Laterality Date   ABDOMINAL HYSTERECTOMY     partial   CHOLECYSTECTOMY     COLONOSCOPY WITH PROPOFOL  N/A 04/17/2017   Procedure: COLONOSCOPY WITH PROPOFOL ;  Surgeon: Viktoria Lamar DASEN, MD;  Location[facility] ENDOSCOPY;  Service: Endoscopy;  Laterality: N/A;   ESOPHAGOGASTRODUODENOSCOPY (EGD) WITH PROPOFOL  N/A 04/13/2021   Procedure: ESOPHAGOGASTRODUODENOSCOPY (EGD) WITH PROPOFOL ;  Surgeon: Jinny Carmine, MD;  Location: [facility] ENDOSCOPY;  Service: Endoscopy;  Laterality: N/A;   EUS N/A 04/22/2021   Procedure: FULL UPPER ENDOSCOPIC ULTRASOUND (EUS) RADIAL;  Surgeon: Glena Mt, MD;  Location: [facility] ENDOSCOPY;  Service: Endoscopy;  Laterality: N/A;  [facility] needed   EUS N/A 10/28/2021   Procedure: UPPER ENDOSCOPIC ULTRASOUND (EUS) LINEAR;  Surgeon: Elta Fonda SHAUNNA, MD;  Location: [facility] ENDOSCOPY;  Service: Gastroenterology;   Laterality: N/A;  [facility]   IR RADIOLOGIST EVAL & MGMT  03/30/2023   transvaginal hysterectomy  04/18/05   with anterior colporrhaphy   Family History: Family History  Problem Relation Age of Onset   Stroke Mother    Hypertension Mother    Prostate cancer Father    Cancer Father        prostate   Heart disease Brother        s/p CABG   Colon cancer Neg Hx     Social History:  reports that she quit smoking about 26 years ago. Her smoking use included cigarettes. She has never used smokeless tobacco. She reports that she does not drink alcohol and does not use drugs.  Physical Exam: BP 116/70 (BP Location: Left Arm, Patient Position: Sitting, Cuff Size: Normal)   Pulse (!) 104   Wt 130 lb (59 kg)   SpO2 98%   BMI 23.03 kg/m    Constitutional:  Alert and oriented, No acute distress. Cardiovascular: No clubbing, cyanosis, or edema. Respiratory: Normal respiratory effort, no increased work of breathing. GI: Abdomen is soft, nontender, nondistended, no abdominal masses   Laboratory Data: Culture data reviewed, see HPI  Pertinent Imaging: I have personally viewed and interpreted the CT/PET scan October 2025 showing no urologic abnormalities, specifically no hydronephrosis, stones, and bladder is decompressed.  Assessment & Plan:   89 year old female with recurrent UTIs over the last 2 months, benign CT October 2025, asymptomatic today.  We discussed the evaluation and treatment of patients with recurrent UTIs at length.  We specifically discussed the differences between asymptomatic bacteriuria and true urinary tract infection.  We discussed the AUA definition of recurrent UTI of at least 2 culture proven symptomatic acute cystitis episodes in a 33-month period, or 3 within a 1 year period.  We discussed the importance of culture directed antibiotic treatment, and antibiotic stewardship.  First-line therapy includes nitrofurantoin(5 days), Bactrim (3 days), or fosfomycin(3 g single  dose).  Possible etiologies of recurrent infection include periurethral tissue atrophy in postmenopausal woman, constipation, sexual activity, incomplete emptying, anatomic abnormalities, and even genetic predisposition.  Finally, we discussed the role of perineal hygiene, timed voiding, adequate hydration, topical vaginal estrogen, cranberry prophylaxis, and low-dose antibiotic prophylaxis.  Continue topical estrogen cream, add cranberry tablet prophylaxis, family deferred low-dose antibiotic prophylaxis RTC 6 months symptom check    Redell Burnet, MD 04/30/2024  [facility] [address] [place], [place] [address] [phone]

## 2024-04-30 NOTE — Patient Instructions (Signed)
 Recommend continuing the topical estrogen cream, and would also add over-the-counter cranberry tablets once daily to help prevent UTIs  Asymptomatic Bacteriuria Asymptomatic bacteriuria is when you have a lot of germs called bacteria in your pee (urine), but they don't cause symptoms.  You may not need treatment for this condition. But you may be more likely to get an infection in the future. What are the causes? You may get more germs in your pee because of: Germs going into your urinary tract. Your urinary tract includes your kidneys, ureters, bladder, and urethra. Germs can get into your urinary tract during sex. A blockage in your urinary tract. This may be from a kidney stone or from an abnormal growth of cells called a tumor. Bladder problems that stop the bladder from emptying. What increases the risk? You're more likely to get this condition if: You have diabetes. You're an older adult. You're even more likely to get it if you live in a long-term care facility. You're female. You're pregnant. You have kidney stones. You've had a kidney transplant. You have a soft tube called a catheter that drains your pee. What are the signs or symptoms? There are no symptoms. How is this diagnosed? This condition is diagnosed with a pee test. It may be found when a sample of pee is taken to treat another condition, such as a problem with your kidney. If you're in your first trimester of pregnancy, you may be screened for this condition. How is this treated? In most cases, treatment isn't needed. Treating the condition can lead to other problems, such as: A yeast infection. The growth of antibiotic-resistant bacteria. These are germs that don't respond to treatment. But some people need to take antibiotics to prevent a kidney infection. You may need treatment if: You're having a procedure that affects your urinary tract. You've had a kidney transplant. You're pregnant. A kidney infection  during pregnancy can lead to: Early labor. Very low birth weight. Newborn death. Follow these instructions at home: Medicines Take your medicines only as told. Take your antibiotics as told. Do not stop taking them even if you start to feel better. General instructions Closely watch your condition for any changes. Drink enough fluid to keep your pee pale yellow. Pee more often to keep your bladder empty. If you're female, keep the area around your vagina and butt clean. Wipe from front to back after you pee or poop. Use each tissue only once when you wipe. Contact a health care provider if: You have symptoms of an infection. These symptoms may include: A burning feeling, or pain when you pee. A strong need to pee. Peeing more often. Pee that turns discolored or cloudy. Blood in your pee. Pee that smells bad or odd. A fever or chills. You have very bad pain in your back or lower belly. You faint. This information is not intended to replace advice given to you by your health care provider. Make sure you discuss any questions you have with your health care provider. Document Revised: 10/04/2022 Document Reviewed: 10/04/2022 Elsevier Patient Education  2024 [employer].

## 2024-05-01 ENCOUNTER — Ambulatory Visit: Payer: Self-pay | Admitting: Urology

## 2024-05-02 ENCOUNTER — Other Ambulatory Visit: Payer: Self-pay

## 2024-05-15 ENCOUNTER — Other Ambulatory Visit (INDEPENDENT_AMBULATORY_CARE_PROVIDER_SITE_OTHER): Payer: Self-pay | Admitting: Vascular Surgery

## 2024-05-15 DIAGNOSIS — I714 Abdominal aortic aneurysm, without rupture, unspecified: Secondary | ICD-10-CM

## 2024-05-18 ENCOUNTER — Other Ambulatory Visit: Payer: Self-pay | Admitting: Internal Medicine

## 2024-05-21 ENCOUNTER — Ambulatory Visit: Admitting: Internal Medicine

## 2024-05-21 ENCOUNTER — Encounter: Payer: Self-pay | Admitting: Internal Medicine

## 2024-05-21 VITALS — BP 112/62 | HR 92 | Temp 98.6°F | Ht 63.0 in | Wt 132.0 lb

## 2024-05-21 DIAGNOSIS — E7800 Pure hypercholesterolemia, unspecified: Secondary | ICD-10-CM

## 2024-05-21 DIAGNOSIS — K219 Gastro-esophageal reflux disease without esophagitis: Secondary | ICD-10-CM

## 2024-05-21 DIAGNOSIS — I10 Essential (primary) hypertension: Secondary | ICD-10-CM

## 2024-05-21 DIAGNOSIS — I714 Abdominal aortic aneurysm, without rupture, unspecified: Secondary | ICD-10-CM

## 2024-05-21 DIAGNOSIS — Z85118 Personal history of other malignant neoplasm of bronchus and lung: Secondary | ICD-10-CM

## 2024-05-21 DIAGNOSIS — E1165 Type 2 diabetes mellitus with hyperglycemia: Secondary | ICD-10-CM

## 2024-05-21 DIAGNOSIS — Z0000 Encounter for general adult medical examination without abnormal findings: Secondary | ICD-10-CM

## 2024-05-21 DIAGNOSIS — R7989 Other specified abnormal findings of blood chemistry: Secondary | ICD-10-CM

## 2024-05-21 DIAGNOSIS — J439 Emphysema, unspecified: Secondary | ICD-10-CM

## 2024-05-21 DIAGNOSIS — I739 Peripheral vascular disease, unspecified: Secondary | ICD-10-CM

## 2024-05-21 LAB — LIPID PANEL
Cholesterol: 182 mg/dL (ref 0–200)
HDL: 42 mg/dL (ref >39.00)
LDL Cholesterol: 113 mg/dL — ABNORMAL HIGH (ref 0–99)
NonHDL: 140.13
Total CHOL/HDL Ratio: 4
Triglycerides: 134 mg/dL (ref 0.0–149.0)
VLDL: 26.8 mg/dL (ref 0.0–40.0)

## 2024-05-21 LAB — HEPATIC FUNCTION PANEL
ALT: 8 U/L (ref 0–35)
AST: 14 U/L (ref 0–37)
Albumin: 4.2 g/dL (ref 3.5–5.2)
Alkaline Phosphatase: 62 U/L (ref 39–117)
Bilirubin, Direct: 0.1 mg/dL (ref 0.0–0.3)
Total Bilirubin: 0.5 mg/dL (ref 0.2–1.2)
Total Protein: 7.2 g/dL (ref 6.0–8.3)

## 2024-05-21 LAB — BASIC METABOLIC PANEL WITH GFR
BUN: 16 mg/dL (ref 6–23)
CO2: 31 meq/L (ref 19–32)
Calcium: 9.7 mg/dL (ref 8.4–10.5)
Chloride: 100 meq/L (ref 96–112)
Creatinine, Ser: 0.88 mg/dL (ref 0.40–1.20)
GFR: 57.46 mL/min — ABNORMAL LOW (ref >60.00)
Glucose, Bld: 129 mg/dL — ABNORMAL HIGH (ref 70–99)
Potassium: 4 meq/L (ref 3.5–5.1)
Sodium: 140 meq/L (ref 135–145)

## 2024-05-21 LAB — T3, FREE: T3, Free: 3.6 pg/mL (ref 2.3–4.2)

## 2024-05-21 LAB — HEMOGLOBIN A1C: Hgb A1c MFr Bld: 6 % (ref 4.6–6.5)

## 2024-05-21 LAB — TSH: TSH: 1.19 u[IU]/mL (ref 0.35–5.50)

## 2024-05-21 LAB — T4, FREE: Free T4: 0.88 ng/dL (ref 0.60–1.60)

## 2024-05-21 MED ORDER — OLMESARTAN MEDOXOMIL-HCTZ 20-12.5 MG PO TABS: 1.0000 | ORAL_TABLET | Freq: Every day | ORAL | 1 refills | Status: AC

## 2024-05-21 NOTE — Assessment & Plan Note (Signed)
 No swallowing problems. No upper symptoms reported. Continue protonix .

## 2024-05-21 NOTE — Assessment & Plan Note (Signed)
Check lipid panel today 

## 2024-05-21 NOTE — Assessment & Plan Note (Signed)
 Followed by Dr Rennie. Recent PET ok.

## 2024-05-21 NOTE — Assessment & Plan Note (Signed)
.   Saw Dr Jama 05/2023 - f/u AAA. Duplex US  of the aorta and iliac arteries shows an AAA measured 4.8 cm.  No significant change compared to the study last year which was 4.7 cm. Follow  12 months. Has f/u appt scheduled.

## 2024-05-21 NOTE — Assessment & Plan Note (Signed)
 Continue benicar /hctz.  Blood pressure has been doing well.   Blood pressure as outlined. No change in medication today. Check metabolic panel.

## 2024-05-21 NOTE — Assessment & Plan Note (Signed)
 Physical today 05/21/24. Had PET - hold on mammogram. .

## 2024-05-21 NOTE — Assessment & Plan Note (Signed)
 Check met b and A1c today. Eating well. Good appetite.

## 2024-05-21 NOTE — Assessment & Plan Note (Signed)
 Has been followed by Dr Nichole.  Reports no longer seeing him. Check tsh, free T3 and free T4 today.

## 2024-05-21 NOTE — Assessment & Plan Note (Signed)
Breathing stable.  No sob.   

## 2024-05-21 NOTE — Assessment & Plan Note (Signed)
Not on cholesterol medication.  Continue blood pressure control.

## 2024-05-21 NOTE — Progress Notes (Signed)
 Subjective:    Patient ID: Heather Cooper, female    DOB: 1931-05-29, 89 y.o.   MRN: [id number]  Patient here for  Chief Complaint  Patient presents with   Medical Management of Chronic Issues   Annual Exam    HPI Here for a physical exam. Had f/u with urology - 04/30/24 - urine ok. Continue topical estrogen cream ad cranberry tablet prophylaxis. 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 PET 03/2024 - negative for bone uptake. Off prednisone . Hgb 12.3 - stable. Continue gentle iron.Saw Dr Jama 05/2023 - f/u AAA. Duplex US  of the aorta and iliac arteries shows an AAA measured 4.8 cm.  No significant change compared to the study last year which was 4.7 cm. Follow  12 months. She is doing well. Feels good. Tries to keep active. Does her exercise around her house. No chest pain or sob reported. No abdominal pain or bowel change reported. Specifically denies any diarrhea.   Past Medical History:  Diagnosis Date   Chemotherapy induced nausea and vomiting    Chicken pox    Esophageal cancer (HCC)    Hypercalcemia    familial hypocalciuric hypercalcemia   Hypercholesterolemia    Hypertension    Lung cancer (HCC)    Osteoporosis    Thyroid  disease    Past Surgical History:  Procedure Laterality Date   ABDOMINAL HYSTERECTOMY     partial   CHOLECYSTECTOMY     COLONOSCOPY WITH PROPOFOL  N/A 04/17/2017   Procedure: COLONOSCOPY WITH PROPOFOL ;  Surgeon: Viktoria Lamar DASEN, MD;  Location: [facility] ENDOSCOPY;  Service: Endoscopy;  Laterality: N/A;   ESOPHAGOGASTRODUODENOSCOPY (EGD) WITH PROPOFOL  N/A 04/13/2021   Procedure: ESOPHAGOGASTRODUODENOSCOPY (EGD) WITH PROPOFOL ;  Surgeon: Jinny Carmine, MD;  Location: [facility] ENDOSCOPY;  Service: Endoscopy;  Laterality: N/A;   EUS N/A 04/22/2021   Procedure: FULL UPPER ENDOSCOPIC ULTRASOUND (EUS) RADIAL;  Surgeon: Glena Mt, MD;  Location: [facility] ENDOSCOPY;  Service: Endoscopy;  Laterality: N/A;  [facility] needed   EUS  N/A 10/28/2021   Procedure: UPPER ENDOSCOPIC ULTRASOUND (EUS) LINEAR;  Surgeon: Elta Fonda SHAUNNA, MD;  Location: [facility] ENDOSCOPY;  Service: Gastroenterology;  Laterality: N/A;  [facility]   IR RADIOLOGIST EVAL & MGMT  03/30/2023   transvaginal hysterectomy  04/18/05   with anterior colporrhaphy   Family History  Problem Relation Age of Onset   Stroke Mother    Hypertension Mother    Prostate cancer Father    Cancer Father        prostate   Heart disease Brother        s/p CABG   Colon cancer Neg Hx    Social History   Socioeconomic History   Marital status: Widowed    Spouse name: Not on file   Number of children: 3   Years of education: Not on file   Highest education level: Not on file  Occupational History   Not on file  Tobacco Use   Smoking status: Former    Current packs/day: 0.00    Types: Cigarettes    Quit date: 06/13/1997    Years since quitting: 26.9   Smokeless tobacco: Never  Vaping Use   Vaping status: Never Used  Substance and Sexual Activity   Alcohol use: No    Alcohol/week: 0.0 standard drinks of alcohol   Drug use: No   Sexual activity: Not Currently  Other Topics Concern   Not on file  Social History Narrative   Lost  her husband November 2019   Social Drivers of [employer]: Low Risk  (09/12/2023)   Overall Financial Resource Strain (CARDIA)    Difficulty of Paying Living Expenses: Not hard at all  Food Insecurity: No Food Insecurity (03/11/2024)   Hunger Vital Sign    Worried About Running Out of Food in the Last Year: Never true    Ran Out of Food in the Last Year: Never true  Transportation Needs: No Transportation Needs (03/11/2024)   PRAPARE - Administrator, Civil Service (Medical): No    Lack of Transportation (Non-Medical): No  Physical Activity: Inactive (09/12/2023)   Exercise Vital Sign    Days of Exercise per Week: 0 days    Minutes of Exercise per Session: 0 min  Stress: No Stress Concern Present  (09/12/2023)   [employer] of Occupational Health - Occupational Stress Questionnaire    Feeling of Stress : Not at all  Social Connections: Moderately Isolated (09/12/2023)   Social Connection and Isolation Panel    Frequency of Communication with Friends and Family: More than three times a week    Frequency of Social Gatherings with Friends and Family: Three times a week    Attends Religious Services: More than 4 times per year    Active Member of Clubs or Organizations: No    Attends Banker Meetings: Never    Marital Status: Widowed     Review of Systems  Constitutional:  Negative for appetite change and unexpected weight change.  HENT:  Negative for congestion, sinus pressure and sore throat.   Eyes:  Negative for pain and visual disturbance.  Respiratory:  Negative for cough, chest tightness and shortness of breath.   Cardiovascular:  Negative for chest pain, palpitations and leg swelling.  Gastrointestinal:  Negative for abdominal pain, diarrhea, nausea and vomiting.  Genitourinary:  Negative for difficulty urinating and dysuria.  Musculoskeletal:  Negative for joint swelling and myalgias.  Skin:  Negative for color change and rash.  Neurological:  Negative for dizziness and headaches.  Hematological:  Negative for adenopathy. Does not bruise/bleed easily.  Psychiatric/Behavioral:  Negative for agitation and dysphoric mood.        Objective:     BP 112/62   Pulse 92   Temp 98.6 F (37 C) (Oral)   Ht 5' 3 (1.6 m)   Wt 132 lb (59.9 kg)   SpO2 98%   BMI 23.38 kg/m  Wt Readings from Last 3 Encounters:  05/21/24 132 lb (59.9 kg)  04/30/24 130 lb (59 kg)  04/09/24 131 lb 1.6 oz (59.5 kg)   Blood pressure recheck: 122/70  Physical Exam Vitals reviewed.  Constitutional:      General: She is not in acute distress.    Appearance: Normal appearance. She is well-developed.  HENT:     Head: Normocephalic and atraumatic.     Right Ear: External ear  normal.     Left Ear: External ear normal.     Mouth/Throat:     Pharynx: No oropharyngeal exudate or posterior oropharyngeal erythema.  Eyes:     General: No scleral icterus.       Right eye: No discharge.        Left eye: No discharge.     Conjunctiva/sclera: Conjunctivae normal.  Neck:     Thyroid : No thyromegaly.  Cardiovascular:     Rate and Rhythm: Normal rate and regular rhythm.  Pulmonary:     Effort: No tachypnea,  accessory muscle usage or respiratory distress.     Breath sounds: Normal breath sounds. No decreased breath sounds or wheezing.  Chest:  Breasts:    Right: No inverted nipple, mass, nipple discharge or tenderness (no axillary adenopathy).     Left: No inverted nipple, mass, nipple discharge or tenderness (no axilarry adenopathy).  Abdominal:     General: Bowel sounds are normal.     Palpations: Abdomen is soft.     Tenderness: There is no abdominal tenderness.  Musculoskeletal:        General: No swelling or tenderness.     Cervical back: Neck supple.  Lymphadenopathy:     Cervical: No cervical adenopathy.  Skin:    Findings: No erythema or rash.  Neurological:     Mental Status: She is alert and oriented to person, place, and time.  Psychiatric:        Mood and Affect: Mood normal.        Behavior: Behavior normal.         Outpatient Encounter Medications as of 05/21/2024  Medication Sig   calcium  carbonate (OS-CAL - DOSED IN MG OF ELEMENTAL CALCIUM ) 1250 (500 Ca) MG tablet Take 1 tablet by mouth.   CRANBERRY PO Take by mouth.   estradiol  (ESTRACE  VAGINAL) 0.1 MG/GM vaginal cream Apply pea sized volume of cream to urethra at night for 2 weeks then 3 nights a week. To help prevent UTI.   ferrous sulfate 325 (65 FE) MG EC tablet Take 325 mg by mouth daily with breakfast. (Patient taking differently: Take 325 mg by mouth every other day.)   fluticasone  (FLONASE ) 50 MCG/ACT nasal spray Place 2 sprays into both nostrils daily.   levocetirizine (XYZAL ) 5  MG tablet Take 1 tablet (5 mg total) by mouth every evening.   olmesartan -hydrochlorothiazide (BENICAR  HCT) 20-12.5 MG tablet TAKE 1 TABLET BY MOUTH ONCE DAILY   pantoprazole  (PROTONIX ) 40 MG tablet Take 1 tablet (40 mg total) by mouth daily.   olmesartan -hydrochlorothiazide (BENICAR  HCT) 20-12.5 MG tablet Take 1 tablet by mouth daily.   [DISCONTINUED] olmesartan -hydrochlorothiazide (BENICAR  HCT) 20-12.5 MG tablet Take 1 tablet by mouth daily.   [DISCONTINUED] prochlorperazine  (COMPAZINE ) 10 MG tablet Take 1 tablet (10 mg total) by mouth every 6 (six) hours as needed (Nausea or vomiting). (Patient not taking: Reported on 09/29/2021)   No facility-administered encounter medications on file as of 05/21/2024.     Lab Results  Component Value Date   WBC 9.8 04/09/2024   HGB 11.3 (L) 04/09/2024   HCT 33.3 (L) 04/09/2024   PLT 265 04/09/2024   GLUCOSE 148 (H) 04/09/2024   CHOL 194 11/14/2023   TRIG 123.0 11/14/2023   HDL 46.60 11/14/2023   LDLCALC 123 (H) 11/14/2023   ALT 12 04/09/2024   AST 17 04/09/2024   NA 133 (L) 04/09/2024   K 3.9 04/09/2024   CL 95 (L) 04/09/2024   CREATININE 0.96 04/09/2024   BUN 16 04/09/2024   CO2 25 04/09/2024   TSH 0.59 11/14/2023   INR 1.0 04/05/2023   HGBA1C 6.4 11/14/2023    No results found.     Assessment & Plan:  Low TSH level Assessment & Plan: Has been followed by Dr Nichole.  Reports no longer seeing him. Check tsh, free T3 and free T4 today.   Orders: -     TSH -     T4, free -     T3, free; Future  Type 2 diabetes mellitus with hyperglycemia, without long-term current  use of insulin (HCC) Assessment & Plan: Check met b and A1c today. Eating well. Good appetite.   Orders: -     Hemoglobin A1c  Primary hypertension Assessment & Plan: Continue benicar /hctz.  Blood pressure has been doing well.   Blood pressure as outlined. No change in medication today. Check metabolic panel.   Orders: -     Basic metabolic panel with  GFR  Hypercholesterolemia Assessment & Plan: Check lipid panel today.   Orders: -     Hepatic function panel -     Lipid panel  Health care maintenance Assessment & Plan: Physical today 05/21/24. Had PET - hold on mammogram. .    PAD (peripheral artery disease) Assessment & Plan: Not on cholesterol medication.  Continue blood pressure control.    History of lung cancer Assessment & Plan: Followed by Dr Rennie. Recent PET ok.    Gastroesophageal reflux disease, unspecified whether esophagitis present Assessment & Plan: No swallowing problems. No upper symptoms reported. Continue protonix .    Emphysema of lung (HCC) Assessment & Plan: Breathing stable. No sob.    Abdominal aortic aneurysm (AAA) without rupture, unspecified part Assessment & Plan: . Saw Dr Jama 05/2023 - f/u AAA. Duplex US  of the aorta and iliac arteries shows an AAA measured 4.8 cm.  No significant change compared to the study last year which was 4.7 cm. Follow  12 months. Has f/u appt scheduled.    Other orders -     Olmesartan  Medoxomil-HCTZ; Take 1 tablet by mouth daily.  Dispense: 90 tablet; Refill: 1     Allena Hamilton, MD

## 2024-05-22 ENCOUNTER — Ambulatory Visit: Payer: Self-pay | Admitting: Internal Medicine

## 2024-05-22 ENCOUNTER — Other Ambulatory Visit: Payer: Self-pay

## 2024-05-27 ENCOUNTER — Other Ambulatory Visit (INDEPENDENT_AMBULATORY_CARE_PROVIDER_SITE_OTHER): Payer: TRICARE For Life (TFL)

## 2024-05-27 ENCOUNTER — Ambulatory Visit (INDEPENDENT_AMBULATORY_CARE_PROVIDER_SITE_OTHER): Payer: TRICARE For Life (TFL) | Admitting: Vascular Surgery

## 2024-06-22 NOTE — Progress Notes (Unsigned)
 "                                                                      MRN : [id number]  Heather Cooper is a 89 y.o. (1931-01-01) female who presents with chief complaint of check circulation.  History of Present Illness:   The patient returns to the office for surveillance of a known abdominal aortic aneurysm. Patient denies unusual or increased abdominal pain or back pain, no other abdominal complaints. No changes suggesting embolic episodes.    There have been no interval changes in the patient's overall health care since his last visit.   Patient denies amaurosis fugax or TIA symptoms. There is no history of claudication or rest pain symptoms of the lower extremities. The patient denies angina or shortness of breath.    Duplex US  of the aorta and iliac arteries shows an AAA measured 5.1 cm.  No significant change compared to the study last year which was 4. 8 cm.  Active Medications[1]  Past Medical History:  Diagnosis Date   Chemotherapy induced nausea and vomiting    Chicken pox    Esophageal cancer (HCC)    Hypercalcemia    familial hypocalciuric hypercalcemia   Hypercholesterolemia    Hypertension    Lung cancer (HCC)    Osteoporosis    Thyroid  disease     Past Surgical History:  Procedure Laterality Date   ABDOMINAL HYSTERECTOMY     partial   CHOLECYSTECTOMY     COLONOSCOPY WITH PROPOFOL  N/A 04/17/2017   Procedure: COLONOSCOPY WITH PROPOFOL ;  Surgeon: Viktoria Lamar DASEN, MD;  Location: [facility] ENDOSCOPY;  Service: Endoscopy;  Laterality: N/A;   ESOPHAGOGASTRODUODENOSCOPY (EGD) WITH PROPOFOL  N/A 04/13/2021   Procedure: ESOPHAGOGASTRODUODENOSCOPY (EGD) WITH PROPOFOL ;  Surgeon: Jinny Carmine, MD;  Location: [facility] ENDOSCOPY;  Service: Endoscopy;  Laterality: N/A;   EUS N/A 04/22/2021   Procedure: FULL UPPER ENDOSCOPIC ULTRASOUND (EUS) RADIAL;  Surgeon: Glena Mt, MD;  Location: [facility] ENDOSCOPY;  Service: Endoscopy;  Laterality: N/A;  [facility] needed   EUS N/A 10/28/2021    Procedure: UPPER ENDOSCOPIC ULTRASOUND (EUS) LINEAR;  Surgeon: Elta Fonda SHAUNNA, MD;  Location: [facility] ENDOSCOPY;  Service: Gastroenterology;  Laterality: N/A;  [facility]   IR RADIOLOGIST EVAL & MGMT  03/30/2023   transvaginal hysterectomy  04/18/05   with anterior colporrhaphy    Social History Social History[2]  Family History Family History  Problem Relation Age of Onset   Stroke Mother    Hypertension Mother    Prostate cancer Father    Cancer Father        prostate   Heart disease Brother        s/p CABG   Colon cancer Neg Hx     Allergies[3]   REVIEW OF SYSTEMS (Negative unless checked)  Constitutional: [] Weight loss  [] Fever  [] Chills Cardiac: [] Chest pain   [] Chest pressure   [] Palpitations   [] Shortness of breath when laying flat   [] Shortness of breath with exertion. Vascular:  [x] Pain in legs with walking   [] Pain in legs at rest  [] History of DVT   [] Phlebitis   [] Swelling in legs   [] Varicose veins   [] Non-healing ulcers Pulmonary:   [] Uses home oxygen   [] Productive cough   []   Hemoptysis   [] Wheeze  [] COPD   [] Asthma Neurologic:  [] Dizziness   [] Seizures   [] History of stroke   [] History of TIA  [] Aphasia   [] Vissual changes   [] Weakness or numbness in arm   [] Weakness or numbness in leg Musculoskeletal:   [] Joint swelling   [] Joint pain   [] Low back pain Hematologic:  [] Easy bruising  [] Easy bleeding   [] Hypercoagulable state   [] Anemic Gastrointestinal:  [] Diarrhea   [] Vomiting  [] Gastroesophageal reflux/heartburn   [] Difficulty swallowing. Genitourinary:  [] Chronic kidney disease   [] Difficult urination  [] Frequent urination   [] Blood in urine Skin:  [] Rashes   [] Ulcers  Psychological:  [] History of anxiety   []  History of major depression.  Physical Examination  There were no vitals filed for this visit. There is no height or weight on file to calculate BMI. Gen: WD/WN, NAD Head: [place]/AT, No temporalis wasting.  Ear/Nose/Throat: Hearing grossly intact, nares  w/o erythema or drainage Eyes: PER, EOMI, sclera nonicteric.  Neck: Supple, no masses.  No bruit or JVD.  Pulmonary:  Good air movement, no audible wheezing, no use of accessory muscles.  Cardiac: RRR, normal S1, S2, no Murmurs. Vascular:  mild trophic changes, no open wounds Vessel Right Left  Radial Palpable Palpable  PT Not Palpable Not Palpable  DP Not Palpable Not Palpable  Gastrointestinal: soft, non-distended. No guarding/no peritoneal signs.  Musculoskeletal: M/S 5/5 throughout.  No visible deformity.  Neurologic: CN 2-12 intact. Pain and light touch intact in extremities.  Symmetrical.  Speech is fluent. Motor exam as listed above. Psychiatric: Judgment intact, Mood & affect appropriate for pt's clinical situation. Dermatologic: No rashes or ulcers noted.  No changes consistent with cellulitis.   CBC Lab Results  Component Value Date   WBC 9.8 04/09/2024   HGB 11.3 (L) 04/09/2024   HCT 33.3 (L) 04/09/2024   MCV 87.2 04/09/2024   PLT 265 04/09/2024    BMET    Component Value Date/Time   NA 140 05/21/2024 1041   NA 130 (L) 10/09/2014 0846   K 4.0 05/21/2024 1041   K 4.1 10/09/2014 0846   CL 100 05/21/2024 1041   CL 98 (L) 10/09/2014 0846   CO2 31 05/21/2024 1041   CO2 25 10/09/2014 0846   GLUCOSE 129 (H) 05/21/2024 1041   GLUCOSE 161 (H) 10/09/2014 0846   BUN 16 05/21/2024 1041   BUN 14 10/09/2014 0846   CREATININE 0.88 05/21/2024 1041   CREATININE 0.96 04/09/2024 0910   CREATININE 0.70 10/09/2014 0846   CALCIUM  9.7 05/21/2024 1041   CALCIUM  9.1 10/09/2014 0846   GFRNONAA 56 (L) 04/09/2024 0910   GFRNONAA >60 10/09/2014 0846   GFRAA >60 02/05/2020 1004   GFRAA >60 10/09/2014 0846   CrCl cannot be calculated (Patient's most recent lab result is older than the maximum 21 days allowed.).  COAG Lab Results  Component Value Date   INR 1.0 04/05/2023   INR 1.0 03/18/2014   INR 1.0 03/18/2014   PROTIME 13.2 03/18/2014    Radiology No results  found.   Assessment/Plan 1. Abdominal aortic aneurysm (AAA) without rupture, unspecified part (Primary) Recommend:   The patient has an abdominal aortic aneurysm that is 5.1 cm or greater by duplex scan.  The patient is otherwise in reasonable health.   Therefore, the patient should undergo repair of the AAA to prevent future leathal rupture.   Patient will require CT angiography of the abdomen and pelvis in order to appropriately plan repair of the AAA.  The risks and benefits as well as the alternative therapies was discussed in detail with the patient. All questions were answered. The patient agrees to move forward the AAA repair.  Therefore, a CT angiogram with be scheduled as an outpatient.  The patient will follow up with me in the office after the CT scan to review the study and finalize plan for repair.  2. Primary hypertension Continue antihypertensive medications as already ordered, these medications have been reviewed and there are no changes at this time.  3. Emphysema of lung (HCC) Continue pulmonary medications and aerosols as already ordered, these medications have been reviewed and there are no changes at this time.   4. Type 2 diabetes mellitus with hyperglycemia, without long-term current use of insulin (HCC) Continue hypoglycemic medications as already ordered, these medications have been reviewed and there are no changes at this time.  Hgb A1C to be monitored as already arranged by primary service  5. Ruptured infrarenal abdominal aortic aneurysm (AAA) (HCC) Patient does not have a ruptured infrarenal abdominal aortic aneurysm the diagnosis checked was AAA preoperative planning - CT Angio Abd/Pel w/ and/or w/o; Future    Cordella Shawl, MD  06/22/2024 1:17 PM      [1]  No outpatient medications have been marked as taking for the 06/24/24 encounter (Appointment) with Shawl, Cordella MATSU, MD.  [2]  Social History Tobacco Use   Smoking status: Former     Current packs/day: 0.00    Types: Cigarettes    Quit date: 06/13/1997    Years since quitting: 27.0   Smokeless tobacco: Never  Vaping Use   Vaping status: Never Used  Substance Use Topics   Alcohol use: No    Alcohol/week: 0.0 standard drinks of alcohol   Drug use: No  [3]  Allergies Allergen Reactions   Paclitaxel  Other (See Comments)    Chest tightness   "

## 2024-06-24 ENCOUNTER — Encounter (INDEPENDENT_AMBULATORY_CARE_PROVIDER_SITE_OTHER): Payer: Self-pay | Admitting: Vascular Surgery

## 2024-06-24 ENCOUNTER — Ambulatory Visit (INDEPENDENT_AMBULATORY_CARE_PROVIDER_SITE_OTHER): Admitting: Vascular Surgery

## 2024-06-24 ENCOUNTER — Ambulatory Visit (INDEPENDENT_AMBULATORY_CARE_PROVIDER_SITE_OTHER)

## 2024-06-24 VITALS — BP 135/78 | HR 78 | Resp 17 | Ht 63.0 in | Wt 132.6 lb

## 2024-06-24 DIAGNOSIS — I10 Essential (primary) hypertension: Secondary | ICD-10-CM | POA: Diagnosis not present

## 2024-06-24 DIAGNOSIS — J439 Emphysema, unspecified: Secondary | ICD-10-CM

## 2024-06-24 DIAGNOSIS — I7133 Infrarenal abdominal aortic aneurysm, ruptured: Secondary | ICD-10-CM | POA: Diagnosis not present

## 2024-06-24 DIAGNOSIS — I714 Abdominal aortic aneurysm, without rupture, unspecified: Secondary | ICD-10-CM | POA: Diagnosis not present

## 2024-06-24 DIAGNOSIS — E1165 Type 2 diabetes mellitus with hyperglycemia: Secondary | ICD-10-CM

## 2024-07-02 ENCOUNTER — Ambulatory Visit

## 2024-07-05 ENCOUNTER — Ambulatory Visit
Admission: RE | Admit: 2024-07-05 | Discharge: 2024-07-05 | Disposition: A | Source: Ambulatory Visit | Attending: Vascular Surgery | Admitting: Vascular Surgery

## 2024-07-05 DIAGNOSIS — I7133 Infrarenal abdominal aortic aneurysm, ruptured: Secondary | ICD-10-CM | POA: Insufficient documentation

## 2024-07-05 MED ORDER — IOHEXOL 350 MG/ML SOLN
100.0000 mL | Freq: Once | INTRAVENOUS | Status: AC | PRN
  Administered 2024-07-05: 100 mL via INTRAVENOUS

## 2024-07-16 ENCOUNTER — Encounter: Payer: Self-pay | Admitting: Internal Medicine

## 2024-07-18 NOTE — Progress Notes (Unsigned)
 "                                                                      MRN : [id number]  Heather Cooper is a 89 y.o. (Mar 04, 1931) female who presents with chief complaint of check circulation.  History of Present Illness:   The patient is seen for follow up evaluation of AAA status post CTA. There were no problems or complications related to the CT scan.   The patient denies interval development of abdominal or back pain. No new lower extremity pain or discoloration of the toes.   The patient denies interval anaurosis fugax. There is no recent history of TIA symptoms or focal motor deficits.   The patient denies PAD or claudication symptoms.   The patient denies recent episodes of angina or shortness of breath.  CT angiography of the abdomen and pelvis dated July 05, 2024 is reviewed by me and shows an infrarenal AAA 5.3 cm.  There is extensive mural thrombus noted throughout the descending thoracic aorta as well as the visceral segment of the abdominal aorta  Active Medications[1]  Past Medical History:  Diagnosis Date   Chemotherapy induced nausea and vomiting    Chicken pox    Esophageal cancer (HCC)    Hypercalcemia    familial hypocalciuric hypercalcemia   Hypercholesterolemia    Hypertension    Lung cancer (HCC)    Osteoporosis    Thyroid  disease     Past Surgical History:  Procedure Laterality Date   ABDOMINAL HYSTERECTOMY     partial   CHOLECYSTECTOMY     COLONOSCOPY WITH PROPOFOL  N/A 04/17/2017   Procedure: COLONOSCOPY WITH PROPOFOL ;  Surgeon: Viktoria Lamar DASEN, MD;  Location: [facility] ENDOSCOPY;  Service: Endoscopy;  Laterality: N/A;   ESOPHAGOGASTRODUODENOSCOPY (EGD) WITH PROPOFOL  N/A 04/13/2021   Procedure: ESOPHAGOGASTRODUODENOSCOPY (EGD) WITH PROPOFOL ;  Surgeon: Jinny Carmine, MD;  Location: [facility] ENDOSCOPY;  Service: Endoscopy;  Laterality: N/A;   EUS N/A 04/22/2021   Procedure: FULL UPPER ENDOSCOPIC ULTRASOUND (EUS) RADIAL;  Surgeon: Glena Mt, MD;   Location: [facility] ENDOSCOPY;  Service: Endoscopy;  Laterality: N/A;  [facility] needed   EUS N/A 10/28/2021   Procedure: UPPER ENDOSCOPIC ULTRASOUND (EUS) LINEAR;  Surgeon: Elta Fonda SHAUNNA, MD;  Location: [facility] ENDOSCOPY;  Service: Gastroenterology;  Laterality: N/A;  [facility]   IR RADIOLOGIST EVAL & MGMT  03/30/2023   transvaginal hysterectomy  04/18/05   with anterior colporrhaphy    Social History Social History[2]  Family History Family History  Problem Relation Age of Onset   Stroke Mother    Hypertension Mother    Prostate cancer Father    Cancer Father        prostate   Heart disease Brother        s/p CABG   Colon cancer Neg Hx     Allergies[3]   REVIEW OF SYSTEMS (Negative unless checked)  Constitutional: [] Weight loss  [] Fever  [] Chills Cardiac: [] Chest pain   [] Chest pressure   [] Palpitations   [] Shortness of breath when laying flat   [] Shortness of breath with exertion. Vascular:  [x] Pain in legs with walking   [] Pain in legs at rest  [] History of DVT   [] Phlebitis   [] Swelling in legs   []   Varicose veins   [] Non-healing ulcers Pulmonary:   [] Uses home oxygen   [] Productive cough   [] Hemoptysis   [] Wheeze  [] COPD   [] Asthma Neurologic:  [] Dizziness   [] Seizures   [] History of stroke   [] History of TIA  [] Aphasia   [] Vissual changes   [] Weakness or numbness in arm   [] Weakness or numbness in leg Musculoskeletal:   [] Joint swelling   [] Joint pain   [] Low back pain Hematologic:  [] Easy bruising  [] Easy bleeding   [] Hypercoagulable state   [] Anemic Gastrointestinal:  [] Diarrhea   [] Vomiting  [] Gastroesophageal reflux/heartburn   [] Difficulty swallowing. Genitourinary:  [] Chronic kidney disease   [] Difficult urination  [] Frequent urination   [] Blood in urine Skin:  [] Rashes   [] Ulcers  Psychological:  [] History of anxiety   []  History of major depression.  Physical Examination  There were no vitals filed for this visit. There is no height or weight on file to calculate  BMI. Gen: WD/WN, NAD Head: [place]/AT, No temporalis wasting.  Ear/Nose/Throat: Hearing grossly intact, nares w/o erythema or drainage Eyes: PER, EOMI, sclera nonicteric.  Neck: Supple, no masses.  No bruit or JVD.  Pulmonary:  Good air movement, no audible wheezing, no use of accessory muscles.  Cardiac: RRR, normal S1, S2, no Murmurs. Vascular:  mild trophic changes, no open wounds Vessel Right Left  Radial Palpable Palpable  PT Not Palpable Not Palpable  DP Not Palpable Not Palpable  Gastrointestinal: soft, non-distended. No guarding/no peritoneal signs.  Musculoskeletal: M/S 5/5 throughout.  No visible deformity.  Neurologic: CN 2-12 intact. Pain and light touch intact in extremities.  Symmetrical.  Speech is fluent. Motor exam as listed above. Psychiatric: Judgment intact, Mood & affect appropriate for pt's clinical situation. Dermatologic: No rashes or ulcers noted.  No changes consistent with cellulitis.   CBC Lab Results  Component Value Date   WBC 9.8 04/09/2024   HGB 11.3 (L) 04/09/2024   HCT 33.3 (L) 04/09/2024   MCV 87.2 04/09/2024   PLT 265 04/09/2024    BMET    Component Value Date/Time   NA 140 05/21/2024 1041   NA 130 (L) 10/09/2014 0846   K 4.0 05/21/2024 1041   K 4.1 10/09/2014 0846   CL 100 05/21/2024 1041   CL 98 (L) 10/09/2014 0846   CO2 31 05/21/2024 1041   CO2 25 10/09/2014 0846   GLUCOSE 129 (H) 05/21/2024 1041   GLUCOSE 161 (H) 10/09/2014 0846   BUN 16 05/21/2024 1041   BUN 14 10/09/2014 0846   CREATININE 0.88 05/21/2024 1041   CREATININE 0.96 04/09/2024 0910   CREATININE 0.70 10/09/2014 0846   CALCIUM  9.7 05/21/2024 1041   CALCIUM  9.1 10/09/2014 0846   GFRNONAA 56 (L) 04/09/2024 0910   GFRNONAA >60 10/09/2014 0846   GFRAA >60 02/05/2020 1004   GFRAA >60 10/09/2014 0846   CrCl cannot be calculated (Patient's most recent lab result is older than the maximum 21 days allowed.).  COAG Lab Results  Component Value Date   INR 1.0 04/05/2023    INR 1.0 03/18/2014   INR 1.0 03/18/2014   PROTIME 13.2 03/18/2014    Radiology CT Angio Abd/Pel w/ and/or w/o Result Date: 07/08/2024 CLINICAL DATA:  89 year old female with history of abdominal aortic aneurysm. EXAM: CT CTA ABD/PEL W/CM AND/OR W/O CM TECHNIQUE: Multidetector CT imaging of the abdomen and pelvis was performed using the standard protocol during bolus administration of intravenous contrast. Multiplanar reconstructed images and MIPs were obtained and reviewed to evaluate the vascular  anatomy. RADIATION DOSE REDUCTION: This exam was performed according to the departmental dose-optimization program which includes automated exposure control, adjustment of the mA and/or kV according to patient size and/or use of iterative reconstruction technique. CONTRAST:  OMNIPAQUE  IOHEXOL  350 MG/ML SOLN COMPARISON:  PET-CT from 03/26/2024 FINDINGS: VASCULAR Aorta: Fusiform infrarenal abdominal aortic aneurysm measuring up to 5.3 x 5.1 cm in maximum short axis dimensions. Extensive fibrofatty and calcific atherosclerotic changes of the suprarenal aorta with at least moderate stenosis near the level of the diaphragmatic hiatus. Celiac: Mild ostial stenosis secondary to atherosclerotic calcification. Otherwise patent. SMA: Moderate ostial stenosis secondary to atherosclerotic plaque. Patent distally. Renals: Single bilateral renal arteries with mild ostial stenosis on the right and moderate ostial stenosis on the left secondary to atherosclerotic plaque formation. IMA: Patent without evidence of aneurysm, dissection, vasculitis or significant stenosis. Inflow: Patent without evidence of aneurysm, dissection, vasculitis or significant stenosis. Proximal Outflow: Bilateral common femoral and visualized portions of the superficial and profunda femoral arteries are patent without evidence of aneurysm, dissection, vasculitis or significant stenosis. Veins: No obvious venous abnormality within the limitations of  this arterial phase study. Review of the MIP images confirms the above findings. NON-VASCULAR Lower chest: No acute abnormality. Similar appearing chronic bibasal reticular opacities. Hepatobiliary: No focal liver abnormality is seen. Status post cholecystectomy. Age-appropriate compensatory common bile duct ectasia. No intrahepatic biliary ductal dilation. Pancreas: Unremarkable. No pancreatic ductal dilatation or surrounding inflammatory changes. Spleen: Normal in size without focal abnormality. Adrenals/Urinary Tract: Adrenal glands are unremarkable. Unchanged subcentimeter simple cysts in the upper pole of the right kidney not requiring additional follow-up. Kidneys are normal, without renal calculi, focal lesion, or hydronephrosis. Bladder is unremarkable. Stomach/Bowel: Stomach is within normal limits. Appendix is not definitively identified. Descending and sigmoid diverticula. No evidence of bowel wall thickening, distention, or inflammatory changes. Lymphatic: No abdominopelvic lymphadenopathy. Reproductive: Status post hysterectomy. No adnexal masses. Other: No abdominal wall hernia or abnormality. No abdominopelvic ascites. Musculoskeletal: No acute osseous abnormality. Similar appearing mild multilevel degenerative changes of the visualized thoracolumbar spine. IMPRESSION: VASCULAR 1. Fusiform infrarenal abdominal aortic aneurysm measuring up to 5.3 cm. Recommend follow-up CT or MR as appropriate in 6 months and referral to or continued care with vascular specialist. (Ref.: J Vasc Surg. 2018; 67:2-77 and J Am Coll Radiol 2013;10(10):789-794.) 2. Extensive fibrofatty and calcific atherosclerotic changes about the suprarenal abdominal aorta, most prominent at the diaphragmatic hiatus where there is at least 50% stenosis. NON-VASCULAR 1. No acute abdominopelvic abnormality. 2. Diverticulosis. Ester Sides, MD [facility] Electronically Signed   By:  Ester Sides M.D.   On: 07/08/2024 11:18   VAS US  AAA DUPLEX Result Date: 06/24/2024 ABDOMINAL AORTA STUDY Patient Name:  LILAS DIEFENDORF Kendricks  Date of Exam:   06/24/2024 Medical Rec #: [id number]         Accession #:    [id number] Date of Birth: 30-Nov-1930         Patient Gender: F Patient Age:   74 years Exam Location:  [facility] Procedure:      VAS US  AAA DUPLEX Referring Phys: CORDELLA SHAWL --------------------------------------------------------------------------------  Indications: Follow up exam for known AAA. Limitations: Air/bowel gas.  Performing Technologist: Elsie Churn RT, RDMS, RVT  Examination Guidelines: A complete evaluation includes B-mode imaging, spectral Doppler, color Doppler, and power Doppler as needed of all accessible portions of each vessel. Bilateral testing is considered an integral part of a complete examination. Limited examinations for reoccurring indications  may be performed as noted.  Abdominal Aorta Findings: +--------+-------+----------+----------+----------+--------+--------+ LocationAP (cm)Trans (cm)PSV (cm/s)Waveform  ThrombusComments +--------+-------+----------+----------+----------+--------+--------+ Proximal2.08   1.96      90        biphasic                   +--------+-------+----------+----------+----------+--------+--------+ Mid     1.43   1.84      118       biphasic                   +--------+-------+----------+----------+----------+--------+--------+ Distal  5.00   5.11      56        monophasic                 +--------+-------+----------+----------+----------+--------+--------+ RT CIA  1.2    1.1       105       biphasic                   +--------+-------+----------+----------+----------+--------+--------+ LT CIA  1.1    1.0       91        biphasic                   +--------+-------+----------+----------+----------+--------+--------+  Summary: Abdominal Aorta: There is evidence of abnormal  dilatation of the distal Abdominal aorta. The largest aortic diameter has mildly increased compared to prior exam. Previous diameter measurement was 4.85 cm obtained on 05/25/23.  *See table(s) above for measurements and observations.  Electronically signed by Cordella Shawl MD on 06/24/2024 at 10:33:06 AM.   Final      Assessment/Plan There are no diagnoses linked to this encounter.   Cordella Shawl, MD  07/18/2024 8:18 AM      [1]  No outpatient medications have been marked as taking for the 07/22/24 encounter (Appointment) with Shawl, Cordella MATSU, MD.  [2]  Social History Tobacco Use   Smoking status: Former    Current packs/day: 0.00    Types: Cigarettes    Quit date: 06/13/1997    Years since quitting: 27.1   Smokeless tobacco: Never  Vaping Use   Vaping status: Never Used  Substance Use Topics   Alcohol use: No    Alcohol/week: 0.0 standard drinks of alcohol   Drug use: No  [3]  Allergies Allergen Reactions   Paclitaxel  Other (See Comments)    Chest tightness   "

## 2024-07-22 ENCOUNTER — Ambulatory Visit (INDEPENDENT_AMBULATORY_CARE_PROVIDER_SITE_OTHER): Admitting: Vascular Surgery

## 2024-07-22 DIAGNOSIS — I10 Essential (primary) hypertension: Secondary | ICD-10-CM

## 2024-07-22 DIAGNOSIS — J439 Emphysema, unspecified: Secondary | ICD-10-CM

## 2024-07-22 DIAGNOSIS — I739 Peripheral vascular disease, unspecified: Secondary | ICD-10-CM

## 2024-07-22 DIAGNOSIS — I714 Abdominal aortic aneurysm, without rupture, unspecified: Secondary | ICD-10-CM

## 2024-07-22 DIAGNOSIS — E1165 Type 2 diabetes mellitus with hyperglycemia: Secondary | ICD-10-CM

## 2024-09-17 ENCOUNTER — Ambulatory Visit

## 2024-09-25 ENCOUNTER — Other Ambulatory Visit

## 2024-10-08 ENCOUNTER — Inpatient Hospital Stay

## 2024-10-08 ENCOUNTER — Inpatient Hospital Stay: Admitting: Internal Medicine

## 2024-10-29 ENCOUNTER — Ambulatory Visit: Admitting: Urology

## 2024-11-19 ENCOUNTER — Ambulatory Visit: Admitting: Internal Medicine
